# Patient Record
Sex: Male | Born: 1935 | Race: White | Hispanic: No | Marital: Married | Smoking: Former smoker
Health system: Southern US, Community
[De-identification: ages and names within clinical notes are randomized; demographics above are authoritative.]

## PROBLEM LIST (undated history)

## (undated) DIAGNOSIS — M79672 Pain in left foot: Secondary | ICD-10-CM

## (undated) DIAGNOSIS — Z952 Presence of prosthetic heart valve: Secondary | ICD-10-CM

## (undated) DIAGNOSIS — M1990 Unspecified osteoarthritis, unspecified site: Secondary | ICD-10-CM

## (undated) DIAGNOSIS — N183 Chronic kidney disease, stage 3 unspecified: Secondary | ICD-10-CM

## (undated) DIAGNOSIS — I5042 Chronic combined systolic (congestive) and diastolic (congestive) heart failure: Secondary | ICD-10-CM

## (undated) DIAGNOSIS — M79671 Pain in right foot: Secondary | ICD-10-CM

## (undated) DIAGNOSIS — Z87448 Personal history of other diseases of urinary system: Secondary | ICD-10-CM

## (undated) DIAGNOSIS — E785 Hyperlipidemia, unspecified: Secondary | ICD-10-CM

## (undated) DIAGNOSIS — K219 Gastro-esophageal reflux disease without esophagitis: Secondary | ICD-10-CM

## (undated) DIAGNOSIS — I739 Peripheral vascular disease, unspecified: Secondary | ICD-10-CM

## (undated) DIAGNOSIS — Z950 Presence of cardiac pacemaker: Secondary | ICD-10-CM

## (undated) DIAGNOSIS — F419 Anxiety disorder, unspecified: Secondary | ICD-10-CM

## (undated) DIAGNOSIS — I2510 Atherosclerotic heart disease of native coronary artery without angina pectoris: Secondary | ICD-10-CM

## (undated) DIAGNOSIS — I10 Essential (primary) hypertension: Secondary | ICD-10-CM

## (undated) DIAGNOSIS — J449 Chronic obstructive pulmonary disease, unspecified: Secondary | ICD-10-CM

## (undated) DIAGNOSIS — Z87891 Personal history of nicotine dependence: Secondary | ICD-10-CM

## (undated) DIAGNOSIS — R011 Cardiac murmur, unspecified: Secondary | ICD-10-CM

## (undated) DIAGNOSIS — I499 Cardiac arrhythmia, unspecified: Secondary | ICD-10-CM

## (undated) DIAGNOSIS — I779 Disorder of arteries and arterioles, unspecified: Secondary | ICD-10-CM

## (undated) DIAGNOSIS — I428 Other cardiomyopathies: Secondary | ICD-10-CM

## (undated) DIAGNOSIS — F32A Depression, unspecified: Secondary | ICD-10-CM

## (undated) DIAGNOSIS — F329 Major depressive disorder, single episode, unspecified: Secondary | ICD-10-CM

## (undated) DIAGNOSIS — I480 Paroxysmal atrial fibrillation: Secondary | ICD-10-CM

## (undated) DIAGNOSIS — I639 Cerebral infarction, unspecified: Secondary | ICD-10-CM

## (undated) DIAGNOSIS — E559 Vitamin D deficiency, unspecified: Secondary | ICD-10-CM

## (undated) DIAGNOSIS — I340 Nonrheumatic mitral (valve) insufficiency: Secondary | ICD-10-CM

## (undated) DIAGNOSIS — IMO0001 Reserved for inherently not codable concepts without codable children: Secondary | ICD-10-CM

## (undated) HISTORY — PX: CAROTID ENDARTERECTOMY: SUR193

## (undated) HISTORY — DX: Presence of prosthetic heart valve: Z95.2

## (undated) HISTORY — PX: TONSILLECTOMY: SUR1361

## (undated) HISTORY — DX: Chronic kidney disease, stage 3 (moderate): N18.3

## (undated) HISTORY — PX: CORONARY ANGIOPLASTY: SHX604

## (undated) HISTORY — PX: EYE SURGERY: SHX253

## (undated) HISTORY — DX: Nonrheumatic mitral (valve) insufficiency: I34.0

## (undated) HISTORY — PX: OTHER SURGICAL HISTORY: SHX169

## (undated) HISTORY — DX: Other cardiomyopathies: I42.8

## (undated) HISTORY — DX: Chronic kidney disease, stage 3 unspecified: N18.30

## (undated) HISTORY — DX: Personal history of nicotine dependence: Z87.891

## (undated) HISTORY — DX: Disorder of arteries and arterioles, unspecified: I77.9

## (undated) HISTORY — PX: INSERT / REPLACE / REMOVE PACEMAKER: SUR710

## (undated) HISTORY — DX: Peripheral vascular disease, unspecified: I73.9

## (undated) HISTORY — DX: Paroxysmal atrial fibrillation: I48.0

## (undated) HISTORY — DX: Chronic combined systolic (congestive) and diastolic (congestive) heart failure: I50.42

---

## 1994-03-28 HISTORY — PX: CAROTID ANGIOGRAM: SHX5765

## 2008-12-26 HISTORY — PX: AORTIC VALVE REPLACEMENT (AVR)/CORONARY ARTERY BYPASS GRAFTING (CABG): SHX5725

## 2013-03-25 ENCOUNTER — Observation Stay (HOSPITAL_COMMUNITY)
Admission: EM | Admit: 2013-03-25 | Discharge: 2013-03-27 | Disposition: A | Discharge status: Home / Self Care | Payer: Medicare Other | Attending: Internal Medicine | Admitting: Internal Medicine

## 2013-03-25 ENCOUNTER — Emergency Department (HOSPITAL_COMMUNITY): Payer: Medicare Other

## 2013-03-25 ENCOUNTER — Encounter (HOSPITAL_COMMUNITY): Payer: Self-pay | Admitting: Emergency Medicine

## 2013-03-25 DIAGNOSIS — I2510 Atherosclerotic heart disease of native coronary artery without angina pectoris: Secondary | ICD-10-CM | POA: Diagnosis present

## 2013-03-25 DIAGNOSIS — Z951 Presence of aortocoronary bypass graft: Secondary | ICD-10-CM | POA: Insufficient documentation

## 2013-03-25 DIAGNOSIS — Z8673 Personal history of transient ischemic attack (TIA), and cerebral infarction without residual deficits: Secondary | ICD-10-CM | POA: Insufficient documentation

## 2013-03-25 DIAGNOSIS — I639 Cerebral infarction, unspecified: Secondary | ICD-10-CM | POA: Diagnosis present

## 2013-03-25 DIAGNOSIS — N183 Chronic kidney disease, stage 3 unspecified: Secondary | ICD-10-CM | POA: Diagnosis present

## 2013-03-25 DIAGNOSIS — E785 Hyperlipidemia, unspecified: Secondary | ICD-10-CM | POA: Insufficient documentation

## 2013-03-25 DIAGNOSIS — I129 Hypertensive chronic kidney disease with stage 1 through stage 4 chronic kidney disease, or unspecified chronic kidney disease: Secondary | ICD-10-CM | POA: Insufficient documentation

## 2013-03-25 DIAGNOSIS — Z7901 Long term (current) use of anticoagulants: Secondary | ICD-10-CM | POA: Insufficient documentation

## 2013-03-25 DIAGNOSIS — G459 Transient cerebral ischemic attack, unspecified: Principal | ICD-10-CM | POA: Diagnosis present

## 2013-03-25 DIAGNOSIS — Z87891 Personal history of nicotine dependence: Secondary | ICD-10-CM | POA: Insufficient documentation

## 2013-03-25 DIAGNOSIS — I10 Essential (primary) hypertension: Secondary | ICD-10-CM | POA: Diagnosis present

## 2013-03-25 DIAGNOSIS — F29 Unspecified psychosis not due to a substance or known physiological condition: Secondary | ICD-10-CM | POA: Insufficient documentation

## 2013-03-25 DIAGNOSIS — Z954 Presence of other heart-valve replacement: Secondary | ICD-10-CM | POA: Insufficient documentation

## 2013-03-25 DIAGNOSIS — Z7982 Long term (current) use of aspirin: Secondary | ICD-10-CM | POA: Insufficient documentation

## 2013-03-25 HISTORY — DX: Pain in left foot: M79.672

## 2013-03-25 HISTORY — DX: Hyperlipidemia, unspecified: E78.5

## 2013-03-25 HISTORY — DX: Atherosclerotic heart disease of native coronary artery without angina pectoris: I25.10

## 2013-03-25 HISTORY — DX: Personal history of other diseases of urinary system: Z87.448

## 2013-03-25 HISTORY — DX: Gastro-esophageal reflux disease without esophagitis: K21.9

## 2013-03-25 HISTORY — DX: Unspecified osteoarthritis, unspecified site: M19.90

## 2013-03-25 HISTORY — DX: Vitamin D deficiency, unspecified: E55.9

## 2013-03-25 HISTORY — DX: Cerebral infarction, unspecified: I63.9

## 2013-03-25 HISTORY — DX: Pain in right foot: M79.671

## 2013-03-25 HISTORY — DX: Essential (primary) hypertension: I10

## 2013-03-25 LAB — CBC
HCT: 42 % (ref 39.0–52.0)
Hemoglobin: 14.8 g/dL (ref 13.0–17.0)
MCV: 96.1 fL (ref 78.0–100.0)
RDW: 12.8 % (ref 11.5–15.5)
WBC: 14.8 10*3/uL — ABNORMAL HIGH (ref 4.0–10.5)

## 2013-03-25 LAB — PROTIME-INR
INR: 2.57 — ABNORMAL HIGH (ref 0.00–1.49)
Prothrombin Time: 26.7 seconds — ABNORMAL HIGH (ref 11.6–15.2)

## 2013-03-25 LAB — POCT I-STAT, CHEM 8
BUN: 30 mg/dL — ABNORMAL HIGH (ref 6–23)
Calcium, Ion: 1.1 mmol/L — ABNORMAL LOW (ref 1.13–1.30)
Chloride: 106 mEq/L (ref 96–112)
Glucose, Bld: 98 mg/dL (ref 70–99)
HCT: 44 % (ref 39.0–52.0)
Potassium: 3.9 mEq/L (ref 3.5–5.1)
Sodium: 143 mEq/L (ref 135–145)

## 2013-03-25 LAB — BASIC METABOLIC PANEL
BUN: 29 mg/dL — ABNORMAL HIGH (ref 6–23)
CO2: 22 mEq/L (ref 19–32)
Chloride: 107 mEq/L (ref 96–112)
Creatinine, Ser: 1.28 mg/dL (ref 0.50–1.35)
GFR calc Af Amer: 59 mL/min — ABNORMAL LOW (ref >90)
GFR calc non Af Amer: 51 mL/min — ABNORMAL LOW (ref >90)
Glucose, Bld: 101 mg/dL — ABNORMAL HIGH (ref 70–99)
Potassium: 4 mEq/L (ref 3.5–5.1)

## 2013-03-25 LAB — POCT I-STAT TROPONIN I: Troponin i, poc: 0.03 ng/mL (ref 0.00–0.08)

## 2013-03-25 NOTE — Consult Note (Signed)
Referring Physician: Dr. Wilkie Aye    Chief Complaint: Transient speech difficulty and confusion.  HPI: Timothy Durham is an 79 y.o. male with a history of hypertension, previous right cerebral infarctions, coronary artery disease and aortic valve replacement, presenting with a history of transient speech difficulty and confusion. Onset was at 7:25 PM tonight. Symptoms lasted 5-10 minutes then resolved. His been no recurrence. Symptoms were very similar to speech difficulty associated with this first stroke. His been on Coumadin for anticoagulation. INR tonight was 2.57. His NIH stroke score in the emergency room was 0. CT scan of his head showed previous right posterior frontal and occipital infarctions, but no acute intracranial findings.  LSN: 7:25 PM on 03/25/2013 tPA Given: No: Rapidly resolved  deficits; on Coumadin with INR greater than 1.8 MRankin: 0  Past Medical History  Diagnosis Date  . Arthritis   . Coronary artery disease   . Hypertension   . Stroke     July 2000  January 2007  . Renal disorder     non acute    No family history on file.   Medications: I have reviewed the patient's current medications.  ROS: History obtained from spouse and the patient  General ROS: negative for - chills, fatigue, fever, night sweats, weight gain or weight loss Psychological ROS: negative for - behavioral disorder, hallucinations, memory difficulties, mood swings or suicidal ideation Ophthalmic ROS: negative for - blurry vision, double vision, eye pain or loss of vision ENT ROS: negative for - epistaxis, nasal discharge, oral lesions, sore throat, tinnitus or vertigo Allergy and Immunology ROS: negative for - hives or itchy/watery eyes Hematological and Lymphatic ROS: negative for - bleeding problems, bruising or swollen lymph nodes Endocrine ROS: negative for - galactorrhea, hair pattern changes, polydipsia/polyuria or temperature intolerance Respiratory ROS: negative for - cough,  hemoptysis, shortness of breath or wheezing Cardiovascular ROS: negative for - chest pain, dyspnea on exertion, edema or irregular heartbeat Gastrointestinal ROS: negative for - abdominal pain, diarrhea, hematemesis, nausea/vomiting or stool incontinence Genito-Urinary ROS: negative for - dysuria, hematuria, incontinence or urinary frequency/urgency Musculoskeletal ROS: negative for - joint swelling or muscular weakness Neurological ROS: as noted in HPI Dermatological ROS: negative for rash and skin lesion changes  Physical Examination: Blood pressure 130/44, pulse 94, temperature 98.4 F (36.9 C), temperature source Oral, resp. rate 18, height 5\' 11"  (1.803 m), weight 85.276 kg (188 lb), SpO2 96.00%.  Neurologic Examination: Mental Status: Alert, oriented, thought content appropriate.  Speech fluent without evidence of aphasia. Able to follow commands without difficulty. Cranial Nerves: II-Visual fields were normal with finger counting. III/IV/VI-Pupils were equal and reacted. Extraocular movements were full and conjugate.    V/VII-no facial numbness and no facial weakness. VIII-normal. X-normal speech. Motor: 5/5 bilaterally with normal tone and bulk Sensory: Normal throughout. Deep Tendon Reflexes: 2+ and symmetric. Plantars: Flexor bilaterally Cerebellar: Normal finger-to-nose testing. Carotid auscultation: Normal  Ct Head Wo Contrast  03/25/2013   CLINICAL DATA:  Hypotension in short of breath. Near syncope. Mumbling and decreased response. History of stroke.  EXAM: CT HEAD WITHOUT CONTRAST  TECHNIQUE: Contiguous axial images were obtained from the base of the skull through the vertex without intravenous contrast.  COMPARISON:  None.  FINDINGS: Diffuse cerebral atrophy. Ventricular dilatation consistent with central atrophy. Patchy white matter changes consistent with small vessel ischemia. Focal areas of encephalomalacia in the right posterior frontal lobe and in the right  occipital lobe consistent with old infarcts. There is no mass effect or midline shift.  No abnormal extra-axial fluid collections. Gray-white matter junctions are distinct. Basal cisterns are not effaced. No evidence of acute intracranial hemorrhage. Visualized paranasal sinuses are not opacified. Vascular calcifications. Calvarium appears intact.  IMPRESSION: No acute intracranial abnormalities. Old infarcts in the right posterior frontal and right occipital regions. Chronic atrophy and small vessel ischemic changes. The   Electronically Signed   By: Burman Nieves M.D.   On: 03/25/2013 21:50    Assessment: 79 y.o. male with a history of hypertension, aortic valve replacement on anticoagulation, and previous right cerebral infarctions, presenting with probable recurrent cerebral ischemic event, likely transient ischemic attack. Recurrent small subcortical stroke cannot be ruled out.  Stroke Risk Factors - family history, hyperlipidemia and hypertension  Plan: 1. HgbA1c, fasting lipid panel 2. MRI, MRA  of the brain without contrast 3. PT consult, OT consult, Speech consult 4. Echocardiogram 5. Carotid dopplers 6. Prophylactic therapy-Anticoagulation: Coumadin 7. Risk factor modification 8. Telemetry monitoring   C.R. Roseanne Reno, MD [facility]  03/25/2013, 11:01 PM

## 2013-03-25 NOTE — ED Provider Notes (Signed)
CSN: [id number]     Arrival date & time 03/25/13  2025 History   First MD Initiated Contact with Patient 03/25/13 2028     Chief Complaint  Patient presents with  . Near Syncope   Patient is a 79 y.o. male presenting with near-syncope.  Near Syncope This is a new problem. The current episode started today. Episode frequency: once. The problem has been resolved. Pertinent negatives include no chest pain, chills, fever, headaches, nausea, numbness, rash, vomiting or weakness. Nothing aggravates the symptoms. He has tried nothing for the symptoms.   Pt was sitting watching television when he began to feel "bad". The patient's wife states she looked over at him and he appeared to be confusion and looked like he was "struggling to breath". She states she went up to him and he was trying to talk to her when she noticed that his speech was slurred and he was confused. She called 911 and several minutes later the patient returned to his baseline. He states he never fully lost consciousness. He states he can't describe how he felt during the episode but he now feels back to his baseline. He denies any chest pain, nausea, vomiting, diarrhea, or bloody bowel movements.   Past Medical History  Diagnosis Date  . Arthritis   . Coronary artery disease   . Hypertension   . Stroke     July 2000  January 2007  . Renal disorder     non acute   Past Surgical History  Procedure Laterality Date  . Aortic valve replacement (avr)/coronary artery bypass grafting (cabg)  12/2008  . Eye surgery    . Carotid angiogram  1996   No family history on file. History  Substance Use Topics  . Smoking status: Former Smoker    Quit date: 09/26/1998  . Smokeless tobacco: Not on file  . Alcohol Use: Not on file    Review of Systems  Constitutional: Negative for fever and chills.  Eyes: Negative for visual disturbance.  Respiratory: Negative for shortness of breath.   Cardiovascular: Positive for near-syncope.  Negative for chest pain.  Gastrointestinal: Negative for nausea and vomiting.  Genitourinary: Negative for dysuria and frequency.  Skin: Negative for rash.  Neurological: Positive for speech difficulty and light-headedness. Negative for weakness, numbness and headaches.  All other systems reviewed and are negative.   Allergies  Penicillins and Sulfa antibiotics  Home Medications   Current Outpatient Rx  Name  Route  Sig  Dispense  Refill  . acetaminophen (TYLENOL) 500 MG tablet   Oral   Take 1,000 mg by mouth every evening.         Marland Kitchen alendronate (FOSAMAX) 70 MG tablet   Oral   Take 70 mg by mouth once a week. sunday         . amLODipine (NORVASC) 2.5 MG tablet   Oral   Take 2.5 mg by mouth every evening.          Marland Kitchen aspirin EC 81 MG tablet   Oral   Take 81 mg by mouth every morning.         Marland Kitchen atorvastatin (LIPITOR) 40 MG tablet   Oral   Take 40 mg by mouth every evening.         . Calcium Carb-Cholecalciferol (CALCIUM 600 + D PO)   Oral   Take 2 tablets by mouth every morning.         . cholecalciferol (VITAMIN D) 1000 UNITS tablet  Oral   Take 1,000 Units by mouth every morning.         . furosemide (LASIX) 40 MG tablet   Oral   Take 40 mg by mouth daily.         . hydrochlorothiazide (HYDRODIURIL) 25 MG tablet   Oral   Take 25 mg by mouth every morning.         . Omega-3 Fatty Acids (FISH OIL) 1200 MG CAPS   Oral   Take 1 capsule by mouth every morning.         Marland Kitchen omeprazole (PRILOSEC) 40 MG capsule   Oral   Take 40 mg by mouth every morning.         . quinapril (ACCUPRIL) 40 MG tablet   Oral   Take 20-40 mg by mouth 2 (two) times daily. 40 mg a.m. And 20mg  p.m.         Marland Kitchen warfarin (COUMADIN) 4 MG tablet   Oral   Take 2-4 mg by mouth every morning. 4 mg every morning except on mondays pt takes 2mg           BP 131/45  Pulse 79  Temp(Src) 98.4 F (36.9 C) (Oral)  Resp 25  Ht 5\' 11"  (1.803 m)  Wt 188 lb (85.276 kg)  BMI  26.23 kg/m2  SpO2 97% Physical Exam  Constitutional: He is oriented to person, place, and time. He appears well-developed and well-nourished. No distress.  HENT:  Head: Normocephalic and atraumatic.  Mouth/Throat: No oropharyngeal exudate.  Eyes: Conjunctivae are normal. Pupils are equal, round, and reactive to light.  Neck: Normal range of motion. Neck supple.  Cardiovascular: Normal rate and normal heart sounds.  Exam reveals no gallop and no friction rub.   No murmur heard. Pulmonary/Chest: Effort normal and breath sounds normal.  Abdominal: Soft. He exhibits no distension. There is no tenderness.  Musculoskeletal: Normal range of motion. He exhibits no edema and no tenderness.  Neurological: He is alert and oriented to person, place, and time. He has normal strength and normal reflexes. No cranial nerve deficit or sensory deficit. Coordination normal. GCS eye subscore is 4. GCS verbal subscore is 5. GCS motor subscore is 6.  Skin: Skin is warm and dry.   ED Course  Procedures (including critical care time) Labs Review Labs Reviewed  CBC - Abnormal; Notable for the following:    WBC 14.8 (*)    All other components within normal limits  BASIC METABOLIC PANEL - Abnormal; Notable for the following:    Glucose, Bld 101 (*)    BUN 29 (*)    Calcium 7.8 (*)    GFR calc non Af Amer 51 (*)    GFR calc Af Amer 59 (*)    All other components within normal limits  GLUCOSE, CAPILLARY - Abnormal; Notable for the following:    Glucose-Capillary 103 (*)    All other components within normal limits  PROTIME-INR - Abnormal; Notable for the following:    Prothrombin Time 26.7 (*)    INR 2.57 (*)    All other components within normal limits  POCT I-STAT, CHEM 8 - Abnormal; Notable for the following:    BUN 30 (*)    Creatinine, Ser 1.40 (*)    Calcium, Ion 1.10 (*)    All other components within normal limits  POCT I-STAT TROPONIN I   Imaging Review Ct Head Wo Contrast  03/25/2013    CLINICAL DATA:  Hypotension in short of breath. Near  syncope. Mumbling and decreased response. History of stroke.  EXAM: CT HEAD WITHOUT CONTRAST  TECHNIQUE: Contiguous axial images were obtained from the base of the skull through the vertex without intravenous contrast.  COMPARISON:  None.  FINDINGS: Diffuse cerebral atrophy. Ventricular dilatation consistent with central atrophy. Patchy white matter changes consistent with small vessel ischemia. Focal areas of encephalomalacia in the right posterior frontal lobe and in the right occipital lobe consistent with old infarcts. There is no mass effect or midline shift. No abnormal extra-axial fluid collections. Gray-white matter junctions are distinct. Basal cisterns are not effaced. No evidence of acute intracranial hemorrhage. Visualized paranasal sinuses are not opacified. Vascular calcifications. Calvarium appears intact.  IMPRESSION: No acute intracranial abnormalities. Old infarcts in the right posterior frontal and right occipital regions. Chronic atrophy and small vessel ischemic changes. The   Electronically Signed   By: Burman Nieves M.D.   On: 03/25/2013 21:50    EKG Interpretation    Date/Time:  Monday March 25 2013 20:38:42 EST Ventricular Rate:  98 PR Interval:  172 QRS Duration: 135 QT Interval:  382 QTC Calculation: 488 R Axis:   -27 Text Interpretation:  Sinus arrhythmia Left bundle branch block Confirmed by HORTON  MD, COURTNEY ([id number]) on 03/25/2013 9:58:08 PM           MDM   Here after presyncopal episode with transient confusion and dysarthria. Now back at baseline. Afebrile with stable vital signs. Non-focal neurological exam. CT head WNL. Labs unremarkable. Consulted neurology for concern for TIA like symptoms who recommended admission for further evaluation. The patient was admitted to internal medicine in HDS condition. Full dose ASA given prior to admission.   1. TIA (transient ischemic attack)   2. Hypertension         Shanon Ace, MD 03/26/13 [phone]

## 2013-03-25 NOTE — ED Notes (Signed)
MD at bedside. 

## 2013-03-25 NOTE — ED Notes (Signed)
Pt wife noticed pt coughing and struggling to breath. She asked "Honey are you all right." No response but mumbled. Wife thought it was stroke due to prev hx of stroke x2. Hypotensive and SOB. ekg 1st degree AVB/ LBB. Denies chest pain.

## 2013-03-25 NOTE — ED Notes (Signed)
MD at bedside.Neurologist

## 2013-03-26 ENCOUNTER — Encounter (HOSPITAL_COMMUNITY): Payer: Self-pay | Admitting: Internal Medicine

## 2013-03-26 ENCOUNTER — Observation Stay (HOSPITAL_COMMUNITY): Payer: Medicare Other

## 2013-03-26 DIAGNOSIS — G459 Transient cerebral ischemic attack, unspecified: Secondary | ICD-10-CM

## 2013-03-26 DIAGNOSIS — I2510 Atherosclerotic heart disease of native coronary artery without angina pectoris: Secondary | ICD-10-CM | POA: Diagnosis present

## 2013-03-26 DIAGNOSIS — I6350 Cerebral infarction due to unspecified occlusion or stenosis of unspecified cerebral artery: Secondary | ICD-10-CM

## 2013-03-26 DIAGNOSIS — I359 Nonrheumatic aortic valve disorder, unspecified: Secondary | ICD-10-CM

## 2013-03-26 DIAGNOSIS — I639 Cerebral infarction, unspecified: Secondary | ICD-10-CM | POA: Diagnosis present

## 2013-03-26 LAB — RAPID URINE DRUG SCREEN, HOSP PERFORMED
Barbiturates: NOT DETECTED
Cocaine: NOT DETECTED
Opiates: NOT DETECTED
Tetrahydrocannabinol: NOT DETECTED

## 2013-03-26 LAB — LIPID PANEL
Cholesterol: 146 mg/dL (ref 0–200)
HDL: 47 mg/dL (ref >39)
Total CHOL/HDL Ratio: 3.1 RATIO
VLDL: 18 mg/dL (ref 0–40)

## 2013-03-26 LAB — BASIC METABOLIC PANEL
BUN: 31 mg/dL — ABNORMAL HIGH (ref 6–23)
Calcium: 8 mg/dL — ABNORMAL LOW (ref 8.4–10.5)
Chloride: 108 mEq/L (ref 96–112)
Creatinine, Ser: 1.33 mg/dL (ref 0.50–1.35)
GFR calc Af Amer: 57 mL/min — ABNORMAL LOW (ref >90)
GFR calc non Af Amer: 49 mL/min — ABNORMAL LOW (ref >90)
Glucose, Bld: 89 mg/dL (ref 70–99)

## 2013-03-26 LAB — PROTIME-INR
INR: 2.48 — ABNORMAL HIGH (ref 0.00–1.49)
Prothrombin Time: 26 seconds — ABNORMAL HIGH (ref 11.6–15.2)

## 2013-03-26 LAB — CBC
Hemoglobin: 13.9 g/dL (ref 13.0–17.0)
MCH: 33.2 pg (ref 26.0–34.0)
MCHC: 35.4 g/dL (ref 30.0–36.0)
Platelets: 195 10*3/uL (ref 150–400)
RDW: 12.8 % (ref 11.5–15.5)

## 2013-03-26 LAB — GLUCOSE, CAPILLARY: Glucose-Capillary: 93 mg/dL (ref 70–99)

## 2013-03-26 LAB — HEMOGLOBIN A1C: Hgb A1c MFr Bld: 5.7 % — ABNORMAL HIGH (ref <5.7)

## 2013-03-26 MED ORDER — WARFARIN SODIUM 2 MG PO TABS: 2.0000 mg | ORAL_TABLET | Freq: Every morning | ORAL | Status: DC

## 2013-03-26 MED ORDER — ASPIRIN EC 325 MG PO TBEC
325.0000 mg | DELAYED_RELEASE_TABLET | Freq: Every day | ORAL | Status: DC
  Administered 2013-03-26: 325 mg via ORAL
  Filled 2013-03-26 (×2): qty 1

## 2013-03-26 MED ORDER — ATORVASTATIN CALCIUM 40 MG PO TABS
40.0000 mg | ORAL_TABLET | Freq: Every evening | ORAL | Status: DC
  Administered 2013-03-26: 40 mg via ORAL
  Filled 2013-03-26 (×2): qty 1

## 2013-03-26 MED ORDER — HYDROCHLOROTHIAZIDE 25 MG PO TABS
25.0000 mg | ORAL_TABLET | Freq: Every morning | ORAL | Status: DC
  Filled 2013-03-26 (×2): qty 1

## 2013-03-26 MED ORDER — LISINOPRIL 40 MG PO TABS
40.0000 mg | ORAL_TABLET | Freq: Every day | ORAL | Status: DC
  Administered 2013-03-26 – 2013-03-27 (×2): 40 mg via ORAL
  Filled 2013-03-26 (×2): qty 1

## 2013-03-26 MED ORDER — FISH OIL 1200 MG PO CAPS: 1.0000 | ORAL_CAPSULE | Freq: Every morning | ORAL | Status: DC

## 2013-03-26 MED ORDER — PANTOPRAZOLE SODIUM 40 MG PO TBEC
80.0000 mg | DELAYED_RELEASE_TABLET | Freq: Every day | ORAL | Status: DC
  Administered 2013-03-26 – 2013-03-27 (×2): 80 mg via ORAL
  Filled 2013-03-26 (×2): qty 2

## 2013-03-26 MED ORDER — VITAMIN D3 25 MCG (1000 UNIT) PO TABS
1000.0000 [IU] | ORAL_TABLET | Freq: Every morning | ORAL | Status: DC
  Administered 2013-03-26 – 2013-03-27 (×2): 1000 [IU] via ORAL
  Filled 2013-03-26 (×2): qty 1

## 2013-03-26 MED ORDER — AMLODIPINE BESYLATE 2.5 MG PO TABS
2.5000 mg | ORAL_TABLET | Freq: Every evening | ORAL | Status: DC
  Filled 2013-03-26 (×2): qty 1

## 2013-03-26 MED ORDER — WARFARIN SODIUM 2 MG PO TABS: 2.0000 mg | ORAL_TABLET | ORAL | Status: DC

## 2013-03-26 MED ORDER — ASPIRIN EC 325 MG PO TBEC: 325.0000 mg | DELAYED_RELEASE_TABLET | Freq: Once | ORAL | Status: DC

## 2013-03-26 MED ORDER — QUINAPRIL HCL 10 MG PO TABS: 20.0000 mg | ORAL_TABLET | Freq: Two times a day (BID) | ORAL | Status: DC

## 2013-03-26 MED ORDER — OMEGA-3-ACID ETHYL ESTERS 1 G PO CAPS
1.0000 g | ORAL_CAPSULE | Freq: Every day | ORAL | Status: DC
  Administered 2013-03-26 – 2013-03-27 (×2): 1 g via ORAL
  Filled 2013-03-26 (×2): qty 1

## 2013-03-26 MED ORDER — LISINOPRIL 20 MG PO TABS
20.0000 mg | ORAL_TABLET | Freq: Every day | ORAL | Status: DC
  Filled 2013-03-26 (×2): qty 1

## 2013-03-26 MED ORDER — WARFARIN SODIUM 4 MG PO TABS
4.0000 mg | ORAL_TABLET | ORAL | Status: DC
  Filled 2013-03-26: qty 1

## 2013-03-26 MED ORDER — WARFARIN SODIUM 4 MG PO TABS
4.0000 mg | ORAL_TABLET | ORAL | Status: DC
  Administered 2013-03-26: 4 mg via ORAL
  Filled 2013-03-26 (×2): qty 1

## 2013-03-26 MED ORDER — FUROSEMIDE 40 MG PO TABS
40.0000 mg | ORAL_TABLET | Freq: Every day | ORAL | Status: DC
  Administered 2013-03-26 – 2013-03-27 (×2): 40 mg via ORAL
  Filled 2013-03-26 (×2): qty 1

## 2013-03-26 MED ORDER — WARFARIN - PHARMACIST DOSING INPATIENT
Freq: Every day | Status: DC
  Administered 2013-03-26: 18:00:00

## 2013-03-26 NOTE — Progress Notes (Signed)
UR completed 

## 2013-03-26 NOTE — Progress Notes (Signed)
Physical Therapy Discharge Patient Details Name: Timothy Durham MRN: [id number] DOB: 1930-11-24 Today's Date: 03/26/2013 Time:  -     Patient discharged from PT services secondary to per OT pt has no needs for PT.  Sign off  GP     Florencia Zaccaro, Eliseo Gum 03/26/2013, 3:08 PM

## 2013-03-26 NOTE — Progress Notes (Signed)
[facility] PROGRESS NOTE  Timothy Durham WUJ:[id number] DOB: 09/03/1930 DOA: 03/25/2013 PCP: No primary provider on file.  Assessment/Plan: TIA  - Monitor the patient on telemetry.  -MRI/MRA of the brain with multiple remote infarcts, no acute infarct seen -2-D echocardiogram done, pending results -Carotid Dopplers with 80-99% L ICA stenosis -> consider Vasc Surg consult? -Continue aspirin. Continue anticoagulation on Coumadin.  -Appreciate neurology following the patient Hypertension  Stable. Continue home antihypertensive medication.  Coronary artery disease  Stable. Continue home medications.  History of CVA  Management as indicated above.  Chronic kidney disease stage III  No prior creatinine for comparison. Continue to monitor.  Prophylaxis  Therapeutic INR.  CODE STATUS  Full code.  Disposition  Admit the patient to telemetry as observation.  Code Status: Full Family Communication: Pt in room (indicate person spoken with, relationship, and if by phone, the number) Disposition Plan: Pending   Consultants:  neurology  HPI/Subjective: No acute events overnight  Objective: Filed Vitals:   03/25/13 2200 03/25/13 2330 03/26/13 0137 03/26/13 0542  BP: 130/44 131/45 169/52 129/46  Pulse: 94 79 98 71  Temp:   97.5 F (36.4 C) 98.3 F (36.8 C)  TempSrc:   Oral Oral  Resp: 18 25 18 16   Height:   5\' 11"  (1.803 m)   Weight:   82.101 kg (181 lb)   SpO2: 96% 97% 98% 94%   No intake or output data in the 24 hours ending 03/26/13 1306 Filed Weights   03/25/13 2040 03/26/13 0137  Weight: 85.276 kg (188 lb) 82.101 kg (181 lb)    Exam:   General:  Awake, in nad  Cardiovascular: regular, s1, s2  Respiratory: normal resp effort, no wheezing  Abdomen: soft, nondistended  Musculoskeletal: perfused, no clubbing   Data Reviewed: Basic Metabolic Panel:  Recent Labs Lab 03/25/13 2046 03/25/13 2103 03/26/13 0820  NA 141 143 143  K 4.0 3.9 4.8  CL 107  106 108  CO2 22  --  23  GLUCOSE 101* 98 89  BUN 29* 30* 31*  CREATININE 1.28 1.40* 1.33  CALCIUM 7.8*  --  8.0*   Liver Function Tests: No results found for this basename: AST, ALT, ALKPHOS, BILITOT, PROT, ALBUMIN,  in the last 168 hours No results found for this basename: LIPASE, AMYLASE,  in the last 168 hours No results found for this basename: AMMONIA,  in the last 168 hours CBC:  Recent Labs Lab 03/25/13 2046 03/25/13 2103 03/26/13 0820  WBC 14.8*  --  7.4  HGB 14.8 15.0 13.9  HCT 42.0 44.0 39.3  MCV 96.1  --  93.8  PLT 206  --  195   Cardiac Enzymes: No results found for this basename: CKTOTAL, CKMB, CKMBINDEX, TROPONINI,  in the last 168 hours BNP (last 3 results) No results found for this basename: PROBNP,  in the last 8760 hours CBG:  Recent Labs Lab 03/25/13 2058  GLUCAP 103*    No results found for this or any previous visit (from the past 240 hour(s)).   Studies: Ct Head Wo Contrast  03/25/2013   CLINICAL DATA:  Hypotension in short of breath. Near syncope. Mumbling and decreased response. History of stroke.  EXAM: CT HEAD WITHOUT CONTRAST  TECHNIQUE: Contiguous axial images were obtained from the base of the skull through the vertex without intravenous contrast.  COMPARISON:  None.  FINDINGS: Diffuse cerebral atrophy. Ventricular dilatation consistent with central atrophy. Patchy white matter changes consistent with small vessel ischemia. Focal areas  of encephalomalacia in the right posterior frontal lobe and in the right occipital lobe consistent with old infarcts. There is no mass effect or midline shift. No abnormal extra-axial fluid collections. Gray-white matter junctions are distinct. Basal cisterns are not effaced. No evidence of acute intracranial hemorrhage. Visualized paranasal sinuses are not opacified. Vascular calcifications. Calvarium appears intact.  IMPRESSION: No acute intracranial abnormalities. Old infarcts in the right posterior frontal and  right occipital regions. Chronic atrophy and small vessel ischemic changes. The   Electronically Signed   By: Burman Nieves M.D.   On: 03/25/2013 21:50   Mri Brain Without Contrast  03/26/2013   CLINICAL DATA:  79 year old male with hypertension and coronary artery disease presenting with speech difficulty and confusion. Symptoms lasted for a few minutes.  EXAM: MRI HEAD WITHOUT CONTRAST  MRA HEAD WITHOUT CONTRAST  TECHNIQUE: Multiplanar, multiecho pulse sequences of the brain and surrounding structures were obtained without intravenous contrast. Angiographic images of the head were obtained using MRA technique without contrast.  COMPARISON:  03/25/2013 CT.  No comparison MR.  FINDINGS: MRI HEAD FINDINGS  No acute infarct.  Remote right posterior frontal infarct with encephalomalacia most prominent peri operculum region. On diffusion sequence, T2 shine through posterior aspect of the remote infarct.  Remote very small left frontal lobe infarct.  Remote small to slightly moderate-size right occipital lobe infarct.  Mild small vessel disease type changes.  Scattered blood breakdown products most prominent left hemisphere may be related to prior episodes hemorrhagic ischemia. Result of prior trauma or cavernomas felt to be secondary considerations.  Global atrophy without hydrocephalus.  No intracranial mass lesion noted on this unenhanced exam.  Partial opacification mastoid air cells greater on left. No obstructing lesion at the drainage of the eustachian tubes noted. Mild paranasal sinus mucosal thickening.  Cervical medullary junction, pituitary region, pineal region and orbital structures unremarkable.  MRA HEAD FINDINGS  Mild narrowing of the supraclinoid segment of the internal carotid artery bilaterally. Anterior circulation otherwise without medium or large size vessel significant stenosis or occlusion.  Mild irregularity and narrowing of portions of the middle cerebral artery branches bilaterally.  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  Only a small portion of the distal vertebral arteries was imaged on the current examination. No significant narrowing of the visualized portions of the distal vertebral arteries. The full extent of the posterior inferior cerebellar arteries was not imaged on the current exam.  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 No significant stenosis of the basilar artery.  Non visualization left anterior inferior cerebellar artery. Narrowing of the right anterior inferior cerebellar artery.  Narrowing of the superior cerebellar arteries more notable on the right.  Mild irregularity narrowing distal posterior cerebral artery branches.  IMPRESSION: No acute infarct.  Remote infarcts as noted above.  Intracranial atherosclerotic type changes as detailed above.  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  Please see above for further detail.   Electronically Signed   By: Bridgett Larsson M.D.   On: 03/26/2013 12:04   Mr Maxine Glenn Head/brain Wo Cm  03/26/2013   CLINICAL DATA:  79 year old male with hypertension and coronary artery disease presenting with speech difficulty and confusion. Symptoms lasted for a few minutes.  EXAM: MRI HEAD WITHOUT CONTRAST  MRA HEAD WITHOUT CONTRAST  TECHNIQUE: Multiplanar, multiecho pulse sequences of the brain and surrounding structures were obtained without intravenous contrast. Angiographic images of the head were obtained using MRA technique without contrast.  COMPARISON:  03/25/2013 CT.  No comparison MR.  FINDINGS: MRI HEAD FINDINGS  No acute infarct.  Remote right posterior frontal infarct with encephalomalacia most prominent peri operculum region. On diffusion sequence, T2 shine through posterior aspect of the remote infarct.  Remote very small left frontal lobe infarct.  Remote small to slightly moderate-size right occipital lobe infarct.  Mild small vessel disease type changes.   Scattered blood breakdown products most prominent left hemisphere may be related to prior episodes hemorrhagic ischemia. Result of prior trauma or cavernomas felt to be secondary considerations.  Global atrophy without hydrocephalus.  No intracranial mass lesion noted on this unenhanced exam.  Partial opacification mastoid air cells greater on left. No obstructing lesion at the drainage of the eustachian tubes noted. Mild paranasal sinus mucosal thickening.  Cervical medullary junction, pituitary region, pineal region and orbital structures unremarkable.  MRA HEAD FINDINGS  Mild narrowing of the supraclinoid segment of the internal carotid artery bilaterally. Anterior circulation otherwise without medium or large size vessel significant stenosis or occlusion.  Mild irregularity and narrowing of portions of the middle cerebral artery branches bilaterally.  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  Only a small portion of the distal vertebral arteries was imaged on the current examination. No significant narrowing of the visualized portions of the distal vertebral arteries. The full extent of the posterior inferior cerebellar arteries was not imaged on the current exam.  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 No significant stenosis of the basilar artery.  Non visualization left anterior inferior cerebellar artery. Narrowing of the right anterior inferior cerebellar artery.  Narrowing of the superior cerebellar arteries more notable on the right.  Mild irregularity narrowing distal posterior cerebral artery branches.  IMPRESSION: No acute infarct.  Remote infarcts as noted above.  Intracranial atherosclerotic type changes as detailed above.  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  Please see above for further detail.   Electronically Signed   By: Bridgett Larsson M.D.   On: 03/26/2013 12:04    Scheduled Meds: . amLODipine  2.5 mg Oral  QPM  . aspirin EC  325 mg Oral Daily  . atorvastatin  40 mg Oral QPM  . cholecalciferol  1,000 Units Oral q morning - 10a  . furosemide  40 mg Oral Daily  . hydrochlorothiazide  25 mg Oral q morning - 10a  . lisinopril  40 mg Oral Daily   And  . lisinopril  20 mg Oral QHS  . omega-3 acid ethyl esters  1 g Oral Daily  . pantoprazole  80 mg Oral Daily  . [START ON 04/01/2013] warfarin  2 mg Oral Q Mon-1800  . warfarin  4 mg Oral Custom  . Warfarin - Pharmacist Dosing Inpatient   Does not apply q1800   Continuous Infusions:   Principal Problem:   TIA (transient ischemic attack) Active Problems:   Hypertension   Coronary artery disease   Stroke   CKD (chronic kidney disease), stage III  Time spent:  Ordean Fouts K  [facility] Pager [phone]. If 7PM-7AM, please contact night-coverage at [email or web address], password [facility] 03/26/2013, 1:06 PM  LOS: 1 day

## 2013-03-26 NOTE — H&P (Signed)
Patient's PCP: Dr. Luiz Iron, [facility]  Chief Complaint: Speech difficulty and confusion, resolved  History of Present Illness: Timothy Durham is a 79 y.o. Caucasian male hypertension, coronary disease, CVA in July of 2000 and January of 2007 on chronic anticoagulation, chronic kidney disease stage III, and arthritis who presents with the above complaints.  Patient symptoms started yesterday around 725 p.m. with speech difficulties and confusion which were noted by the wife.  She called 911.  Symptoms last only for a few minutes (patient reports 2-3 minutes).  He presented to the emergency department for further evaluation.  He was seen by neurology, recommended hospitalist service admit the patient for further care and management.  Patient denies any recent fevers, chills, nausea, vomiting, chest pain, shortness of breath, abdominal pain, diarrhea, headaches or vision changes.  Review of Systems: All systems reviewed with the patient and positive as per history of present illness, otherwise all other systems are negative.  Past Medical History  Diagnosis Date  . Arthritis   . Coronary artery disease   . Hypertension   . Stroke     July 2000  January 2007  . Renal disorder     non acute   Past Surgical History  Procedure Laterality Date  . Aortic valve replacement (avr)/coronary artery bypass grafting (cabg)  12/2008  . Eye surgery    . Carotid angiogram  1996   Family History  Problem Relation Age of Onset  . Stroke Mother   . Cancer Father    History   Social History  . Marital Status: Married    Spouse Name: N/A    Number of Children: N/A  . Years of Education: N/A   Occupational History  . Not on file.   Social History Main Topics  . Smoking status: Former Smoker    Quit date: 09/26/1998  . Smokeless tobacco: Not on file  . Alcohol Use: 0.6 oz/week    1 Cans of beer per week     Comment: drinks one can of beer daily.  . Drug Use: No  . Sexual Activity: Not on file    Other Topics Concern  . Not on file   Social History Narrative  . No narrative on file   Allergies: Penicillins and Sulfa antibiotics  Home Meds: Prior to Admission medications   Medication Sig Start Date End Date Taking? Authorizing Provider  acetaminophen (TYLENOL) 500 MG tablet Take 1,000 mg by mouth every evening.   Yes Historical Provider, MD  alendronate (FOSAMAX) 70 MG tablet Take 70 mg by mouth once a week. sunday 02/28/13  Yes Historical Provider, MD  amLODipine (NORVASC) 2.5 MG tablet Take 2.5 mg by mouth every evening.  03/12/13  Yes Historical Provider, MD  aspirin EC 81 MG tablet Take 81 mg by mouth every morning.   Yes Historical Provider, MD  atorvastatin (LIPITOR) 40 MG tablet Take 40 mg by mouth every evening.   Yes Historical Provider, MD  Calcium Carb-Cholecalciferol (CALCIUM 600 + D PO) Take 2 tablets by mouth every morning.   Yes Historical Provider, MD  cholecalciferol (VITAMIN D) 1000 UNITS tablet Take 1,000 Units by mouth every morning.   Yes Historical Provider, MD  furosemide (LASIX) 40 MG tablet Take 40 mg by mouth daily. 02/28/13  Yes Historical Provider, MD  hydrochlorothiazide (HYDRODIURIL) 25 MG tablet Take 25 mg by mouth every morning.   Yes Historical Provider, MD  Omega-3 Fatty Acids (FISH OIL) 1200 MG CAPS Take 1 capsule by mouth every morning.  Yes Historical Provider, MD  omeprazole (PRILOSEC) 40 MG capsule Take 40 mg by mouth every morning.   Yes Historical Provider, MD  quinapril (ACCUPRIL) 40 MG tablet Take 20-40 mg by mouth 2 (two) times daily. 40 mg a.m. And 20mg  p.m.   Yes Historical Provider, MD  warfarin (COUMADIN) 4 MG tablet Take 2-4 mg by mouth every morning. 4 mg every morning except on mondays pt takes 2mg  02/24/13  Yes Historical Provider, MD    Physical Exam: Blood pressure 169/52, pulse 98, temperature 97.5 F (36.4 C), temperature source Oral, resp. rate 18, height 5\' 11"  (1.803 m), weight 82.101 kg (181 lb), SpO2 98.00%. General:  Awake, Oriented x3, No acute distress. HEENT: EOMI, Moist mucous membranes Neck: Supple CV: S1 and S2 Lungs: Clear to ascultation bilaterally Abdomen: Soft, Nontender, Nondistended, +bowel sounds. Ext: Good pulses. Trace edema. No clubbing or cyanosis noted. Neuro: Cranial Nerves II-XII grossly intact. Has 5/5 motor strength in upper and lower extremities.  Lab results:  Recent Labs  03/25/13 2046 03/25/13 2103  NA 141 143  K 4.0 3.9  CL 107 106  CO2 22  --   GLUCOSE 101* 98  BUN 29* 30*  CREATININE 1.28 1.40*  CALCIUM 7.8*  --    No results found for this basename: AST, ALT, ALKPHOS, BILITOT, PROT, ALBUMIN,  in the last 72 hours No results found for this basename: LIPASE, AMYLASE,  in the last 72 hours  Recent Labs  03/25/13 2046 03/25/13 2103  WBC 14.8*  --   HGB 14.8 15.0  HCT 42.0 44.0  MCV 96.1  --   PLT 206  --    No results found for this basename: CKTOTAL, CKMB, CKMBINDEX, TROPONINI,  in the last 72 hours No components found with this basename: POCBNP,  No results found for this basename: DDIMER,  in the last 72 hours No results found for this basename: HGBA1C,  in the last 72 hours No results found for this basename: CHOL, HDL, LDLCALC, TRIG, CHOLHDL, LDLDIRECT,  in the last 72 hours No results found for this basename: TSH, T4TOTAL, FREET3, T3FREE, THYROIDAB,  in the last 72 hours No results found for this basename: VITAMINB12, FOLATE, FERRITIN, TIBC, IRON, RETICCTPCT,  in the last 72 hours Imaging results:  Ct Head Wo Contrast  03/25/2013   CLINICAL DATA:  Hypotension in short of breath. Near syncope. Mumbling and decreased response. History of stroke.  EXAM: CT HEAD WITHOUT CONTRAST  TECHNIQUE: Contiguous axial images were obtained from the base of the skull through the vertex without intravenous contrast.  COMPARISON:  None.  FINDINGS: Diffuse cerebral atrophy. Ventricular dilatation consistent with central atrophy. Patchy white matter changes consistent  with small vessel ischemia. Focal areas of encephalomalacia in the right posterior frontal lobe and in the right occipital lobe consistent with old infarcts. There is no mass effect or midline shift. No abnormal extra-axial fluid collections. Gray-white matter junctions are distinct. Basal cisterns are not effaced. No evidence of acute intracranial hemorrhage. Visualized paranasal sinuses are not opacified. Vascular calcifications. Calvarium appears intact.  IMPRESSION: No acute intracranial abnormalities. Old infarcts in the right posterior frontal and right occipital regions. Chronic atrophy and small vessel ischemic changes. The   Electronically Signed   By: Burman Nieves M.D.   On: 03/25/2013 21:50   Assessment & Plan by Problem: TIA Monitor the patient on telemetry.  Initiate TIA workup with serial neuro checks.  Will get an MRI/MRA of the brain in the morning.  Get  a 2-D echocardiogram and carotid Dopplers.  Continue aspirin.  Continue anticoagulation on Coumadin.  Appreciate neurology following the patient.  Request PT/OT/speech therapy consultation.  Check lipid panel and hemoglobin A1c to risk stratify the patient.  Hypertension Stable.  Continue home antihypertensive medication.  Coronary artery disease Stable.  Continue home medications.  History of CVA Management as indicated above.  Chronic kidney disease stage III No prior creatinine for comparison.  Continue to monitor.  Prophylaxis Therapeutic INR.  CODE STATUS Full code.  Disposition Admit the patient to telemetry as observation.  Time spent on admission, talking to the patient, and coordinating care was: 45 mins.  Pegge Cumberledge A, MD 03/26/2013, 2:58 AM

## 2013-03-26 NOTE — Progress Notes (Signed)
  Echocardiogram 2D Echocardiogram has been performed.  Timothy Durham 03/26/2013, 9:35 AM

## 2013-03-26 NOTE — Progress Notes (Signed)
ANTICOAGULATION CONSULT NOTE - Initial Consult  Pharmacy Consult for Warfarin  Indication: AVR (per MD notes), h/o stroke  Allergies  Allergen Reactions  . Penicillins     Unknown childhood reaction   . Sulfa Antibiotics     unknown   Patient Measurements: Height: 5\' 11"  (180.3 cm) Weight: 181 lb (82.101 kg) IBW/kg (Calculated) : 75.3  Vital Signs: Temp: 97.5 F (36.4 C) (12/30 0137) Temp src: Oral (12/30 0137) BP: 169/52 mmHg (12/30 0137) Pulse Rate: 98 (12/30 0137)  Labs:  Recent Labs  03/25/13 2046 03/25/13 2103 03/25/13 2129  HGB 14.8 15.0  --   HCT 42.0 44.0  --   PLT 206  --   --   LABPROT  --   --  26.7*  INR  --   --  2.57*  CREATININE 1.28 1.40*  --     Estimated Creatinine Clearance: 44.8 ml/min (by C-G formula based on Cr of 1.4).   Medical History: Past Medical History  Diagnosis Date  . Arthritis   . Coronary artery disease   . Hypertension   . Stroke     July 2000  January 2007  . Renal disorder     non acute    Medications:  Warfarin 4mg  daily except 2mg  on Mondays  Assessment: 79 y/o M here with TIA/confusion. On chronic warfarin for AVR (per neurology note), also with h/o infarctions. INR on admit is 2.57, other labs as above.   Goal of Therapy:  INR 2-3 Monitor platelets by anticoagulation protocol: Yes   Plan:  -Warfarin per home regimen  -Daily PT/INR, adjust dose as needed -Monitor for bleeding  Thank you for allowing me to take part in this patient's care,  Abran Duke, PharmD Clinical Pharmacist Phone: [phone] Pager: [phone] 03/26/2013 3:04 AM

## 2013-03-26 NOTE — ED Provider Notes (Signed)
I saw and evaluated the patient, reviewed the resident's note and I agree with the findings and plan.  EKG Interpretation    Date/Time:  Monday March 25 2013 20:38:42 EST Ventricular Rate:  98 PR Interval:  172 QRS Duration: 135 QT Interval:  382 QTC Calculation: 488 R Axis:   -27 Text Interpretation:  Sinus arrhythmia Left bundle branch block Confirmed by Kadyn Chovan  MD, Toni Amend ([id number]) on 03/25/2013 9:58:08 PM            Patient presents with ams/near syncopal episode.  Patient had episode of decreased LOC and slurred speech.  Patient was aware during episode and low suspicion for seizure.  Hx of CVA x 2.  On coumadin.  Now back to baseline.  Neurologically intact.  Labs obtained and CT neg.  Will admit for TIA work-up.  Shon Baton, MD 03/26/13 [phone]

## 2013-03-26 NOTE — Evaluation (Signed)
Occupational Therapy Evaluation Patient Details Name: Timothy Durham MRN: [id number] DOB: 07/29/1930 Today's Date: 03/26/2013 Time: 5638-7564 OT Time Calculation (min): 24 min  OT Assessment / Plan / Recommendation History of present illness Timothy Durham is a 79 y.o. Caucasian male hypertension, coronary disease, CVA in July of 2000 and January of 2007 on chronic anticoagulation, chronic kidney disease stage III, and arthritis who presents with the above complaints.  Patient symptoms started yesterday around 725 p.m. with speech difficulties and confusion which were noted by the wife.  She called 911.  Symptoms last only for a few minutes (patient reports 2-3 minutes).     Clinical Impression   Pt is currently at his functional baseline which is independent for selfcare tasks, mobility, and functional transfers.  Pt with no noted balance or cognitive deficits during testing.  No further OT needs at this time.      OT Assessment  Patient does not need any further OT services    Follow Up Recommendations  No OT follow up       Equipment Recommendations  None recommended by OT          Precautions / Restrictions Precautions Precautions: None   Pertinent Vitals/Pain No report of pain    ADL  Eating/Feeding: Simulated;Independent Where Assessed - Eating/Feeding: Chair Grooming: Simulated;Independent Where Assessed - Grooming: Unsupported standing Upper Body Bathing: Simulated;Independent Where Assessed - Upper Body Bathing: Unsupported standing Lower Body Bathing: Simulated;Independent Where Assessed - Lower Body Bathing: Unsupported sit to stand Upper Body Dressing: Simulated;Independent Where Assessed - Upper Body Dressing: Unsupported sitting Lower Body Dressing: Simulated;Modified independent Where Assessed - Lower Body Dressing: Unsupported sit to stand Toilet Transfer: Performed;Independent Toilet Transfer Method: Other (comment) (ambulate without use of assistive  device) Toilet Transfer Equipment: Regular height toilet Toileting - Clothing Manipulation and Hygiene: Simulated;Independent Where Assessed - Toileting Clothing Manipulation and Hygiene: Sit to stand from 3-in-1 or toilet Tub/Shower Transfer: Simulated;Independent Tub/Shower Transfer Method: Ambulating Equipment Used: Gait belt Transfers/Ambulation Related to ADLs: Pt is independent with mobility and selfcare tasks at this time.      Visit Information  Last OT Received On: 03/26/13 Assistance Needed: +1 History of Present Illness: Timothy Durham is a 79 y.o. Caucasian male hypertension, coronary disease, CVA in July of 2000 and January of 2007 on chronic anticoagulation, chronic kidney disease stage III, and arthritis who presents with the above complaints.  Patient symptoms started yesterday around 725 p.m. with speech difficulties and confusion which were noted by the wife.  She called 911.  Symptoms last only for a few minutes (patient reports 2-3 minutes).         Prior Functioning     Home Living Family/patient expects to be discharged to:: Private residence Living Arrangements: Spouse/significant other Available Help at Discharge: Available 24 hours/day Type of Home: Apartment Home Access: Level entry Home Layout: One level Home Equipment: None Prior Function Level of Independence: Independent Communication Communication: No difficulties Dominant Hand: Right         Vision/Perception Vision - History Baseline Vision: No visual deficits Patient Visual Report: No change from baseline Vision - Assessment Vision Assessment: Vision tested Tracking/Visual Pursuits: Able to track stimulus in all quads without difficulty Visual Fields: No apparent deficits Perception Perception: Within Functional Limits Praxis Praxis: Intact   Cognition  Cognition Arousal/Alertness: Awake/alert Behavior During Therapy: WFL for tasks assessed/performed Overall Cognitive Status:  Within Functional Limits for tasks assessed    Extremity/Trunk Assessment Upper Extremity Assessment Upper Extremity Assessment: Overall [facility]  for tasks assessed Lower Extremity Assessment Lower Extremity Assessment: Overall WFL for tasks assessed Cervical / Trunk Assessment Cervical / Trunk Assessment: Normal     Mobility Transfers Transfers: Sit to Stand;Stand to Sit Sit to Stand: 7: Independent;Without upper extremity assist;From toilet Stand to Sit: 7: Independent;Without upper extremity assist;To toilet        Balance Balance Balance Assessed: Yes Dynamic Standing Balance Dynamic Standing - Balance Support: No upper extremity supported Dynamic Standing - Level of Assistance: 7: Independent   End of Session OT - End of Session Equipment Utilized During Treatment: Gait belt Activity Tolerance: Patient tolerated treatment well Patient left: in chair;with call bell/phone within reach;with family/visitor present Nurse Communication: Mobility status  GO Functional Assessment Tool Used: clinical judgement Functional Limitation: Self care Self Care Current Status (J1914): 0 percent impaired, limited or restricted Self Care Goal Status (N8295): 0 percent impaired, limited or restricted Self Care Discharge Status (A2130): 0 percent impaired, limited or restricted   Osualdo Hansell OTR/L 03/26/2013, 2:53 PM

## 2013-03-26 NOTE — Progress Notes (Signed)
Stroke Team Progress Note  HISTORY Timothy Durham is an 79 y.o. male with a history of hypertension, previous right cerebral infarctions, coronary artery disease and aortic valve replacement, presenting with a history of transient speech difficulty and confusion. Onset was at 7:25 PM tonight 03/25/2013. Symptoms lasted 5-10 minutes then resolved. His been no recurrence. Symptoms were very similar to speech difficulty associated with this first stroke. His been on Coumadin for anticoagulation. INR tonight was 2.57. His NIH stroke score in the emergency room was 0. CT scan of his head showed previous right posterior frontal and occipital infarctions, but no acute intracranial findings. Patient was not a TPA candidate secondary to Rapidly resolved deficits; on Coumadin with INR greater than 1.8. He was admitted for further evaluation and treatment.  SUBJECTIVE No family is at the bedside.  Overall he feels his condition is completely resolved. He is sitting up in the chair at the bedside. His plans are to discharge home today.  OBJECTIVE Most recent Vital Signs: Filed Vitals:   03/26/13 1447 03/26/13 2031 03/26/13 2231 03/27/13 0532  BP:  137/41 128/40 134/55  Pulse: 78 69 80 79  Temp:  98.1 F (36.7 C)  97.3 F (36.3 C)  TempSrc:  Oral  Oral  Resp:  20  19  Height:      Weight:      SpO2: 96% 98%  95%   CBG (last 3)   Recent Labs  03/25/13 2058 03/26/13 2227 03/27/13 0619  GLUCAP 103* 93 87    IV Fluid Intake:     MEDICATIONS  . aspirin EC  325 mg Oral Daily  . atorvastatin  40 mg Oral QPM  . cholecalciferol  1,000 Units Oral q morning - 10a  . furosemide  40 mg Oral Daily  . lisinopril  40 mg Oral Daily   And  . lisinopril  20 mg Oral QHS  . omega-3 acid ethyl esters  1 g Oral Daily  . pantoprazole  80 mg Oral Daily  . [START ON 04/01/2013] warfarin  2 mg Oral Q Mon-1800  . warfarin  4 mg Oral Custom  . Warfarin - Pharmacist Dosing Inpatient   Does not apply q1800   PRN:     Diet:  Cardiac thin liquids Activity:   Bathroom privileges with assistance DVT Prophylaxis:  warfarin  CLINICALLY SIGNIFICANT STUDIES Basic Metabolic Panel:   Recent Labs Lab 03/25/13 2046 03/25/13 2103 03/26/13 0820  NA 141 143 143  K 4.0 3.9 4.8  CL 107 106 108  CO2 22  --  23  GLUCOSE 101* 98 89  BUN 29* 30* 31*  CREATININE 1.28 1.40* 1.33  CALCIUM 7.8*  --  8.0*   Liver Function Tests: No results found for this basename: AST, ALT, ALKPHOS, BILITOT, PROT, ALBUMIN,  in the last 168 hours CBC:   Recent Labs Lab 03/25/13 2046 03/25/13 2103 03/26/13 0820  WBC 14.8*  --  7.4  HGB 14.8 15.0 13.9  HCT 42.0 44.0 39.3  MCV 96.1  --  93.8  PLT 206  --  195   Coagulation:   Recent Labs Lab 03/25/13 2129 03/26/13 0820 03/27/13 0520  LABPROT 26.7* 26.0* 21.1*  INR 2.57* 2.48* 1.89*   Cardiac Enzymes: No results found for this basename: CKTOTAL, CKMB, CKMBINDEX, TROPONINI,  in the last 168 hours Urinalysis: No results found for this basename: COLORURINE, APPERANCEUR, LABSPEC, PHURINE, GLUCOSEU, HGBUR, BILIRUBINUR, KETONESUR, PROTEINUR, UROBILINOGEN, NITRITE, LEUKOCYTESUR,  in the last 168 hours Lipid Panel  Component Value Date/Time   CHOL 146 03/26/2013 0820   TRIG 91 03/26/2013 0820   HDL 47 03/26/2013 0820   CHOLHDL 3.1 03/26/2013 0820   VLDL 18 03/26/2013 0820   LDLCALC 81 03/26/2013 0820   HgbA1C  Lab Results  Component Value Date   HGBA1C 5.7* 03/26/2013    Urine Drug Screen:      Component Value Date/Time   LABOPIA NONE DETECTED 03/26/2013 1408    Alcohol Level: No results found for this basename: ETH,  in the last 168 hours  CT of the brain  03/25/2013    No acute intracranial abnormalities. Old infarcts in the right posterior frontal and right occipital regions. Chronic atrophy and small vessel ischemic changes.  MRI of the brain  03/26/2013    No acute infarct.  Remote infarcts.  Intracranial atherosclerotic type changes  MRA of the  brain  03/26/2013   1.8 mm right middle cerebral artery bifurcation aneurysm suspected.  2D Echocardiogram  EF 40-45%. There was severe concentric hypertrophy. Moderate global hypokinesis with regional variation. Bioprosthetic aortic valve, valves not well seen.  Carotid Doppler   1-39% right internal carotid artery stenosis. The left internal carotid artery demonstrates elevated peak systolic velocities suggestive of 80-99% stenosis, elevated end diastolic velocities suggestive of 40-59% stenosis, and ICA/CCA ratio suggestive of upper range 40-59% stenosis. The left ECA demonstrates elevated velocities suggestive of stenosis. Vertebral arteries are patent with antegrade flow.  CXR    EEG   EKG  LBBB, unknown rhythm, irregular.   Therapy Recommendations no needs  Physical Exam   Mental Status:  Alert, oriented, thought content appropriate. Speech fluent without evidence of aphasia. Able to follow commands without difficulty.  Cranial Nerves:  II-Visual fields were normal with finger counting.  III/IV/VI-Pupils were equal and reacted. Extraocular movements were full and conjugate.  V/VII-no facial numbness and no facial weakness.  VIII-normal.  X-normal speech.  Motor: 5/5 bilaterally with normal tone and bulk  Sensory: Normal throughout.  Deep Tendon Reflexes: 2+ and symmetric.  Plantars: Flexor bilaterally  Cerebellar: Normal finger-to-nose testing.  Carotid auscultation: Normal  ASSESSMENT [name] is a 79 y.o. male presenting with transient speech difficulty and confusion. Imaging confirms no acute stroke. Dx:  TIA.  On aspirin 81 mg orally every day and warfarin prior to admission. Now on aspirin 325 mg orally every day and warfarin for secondary stroke prevention. Patient with no resultant neuro deficits. Work up underway.   Left ICA stenosis 40-59% Hyperlipidemia, LDL 81, on lipitor 40 PTA, now on lipitor 40 mg daily, at goal LDL < 100 Hx left brain stroke 2000 w/ R  HP and aphasia, has improved over time. Placed on warfarin at that time per discussion with wife. She is not aware of hx of atrial fibrillation.  Right middle cerebral artery bifurcation aneurysm  Hospital day # 2  TREATMENT/PLAN  Continue aspirin 81 mg orally every day and warfarin for secondary stroke prevention. As prior to admission. Will adjust aspirin down to 81mg  daily.  F/u EEG as an OP  Ok to discharge home today from stroke standpoint  Follow up in [facility] Clinic at [facility] in 2 months. This was added to AVS.  Annie Main, MSN, RN, ANVP-BC, ANP-BC, GNP-BC [facility] Pager: [phone] 03/27/2013 9:49 AM  I have personally obtained a history, examined the patient, evaluated imaging results, and formulated the assessment and plan of care. I agree with the above.   Elspeth Cho, DO Neurology-Stroke

## 2013-03-26 NOTE — Progress Notes (Signed)
*  PRELIMINARY RESULTS* Vascular Ultrasound Carotid Duplex (Doppler) has been completed.   Findings suggest 1-39% right internal carotid artery stenosis, and 40-59% left internal carotid artery stenosis. The left external carotid artery demonstrates elevated velocities suggestive of stenosis. Vertebral arteries are patent with antegrade flow.  03/26/2013 11:01 AM Gertie Fey, RVT, RDCS, RDMS

## 2013-03-27 ENCOUNTER — Observation Stay (HOSPITAL_COMMUNITY): Payer: Medicare Other

## 2013-03-27 DIAGNOSIS — I2510 Atherosclerotic heart disease of native coronary artery without angina pectoris: Secondary | ICD-10-CM

## 2013-03-27 DIAGNOSIS — I10 Essential (primary) hypertension: Secondary | ICD-10-CM

## 2013-03-27 DIAGNOSIS — I6350 Cerebral infarction due to unspecified occlusion or stenosis of unspecified cerebral artery: Secondary | ICD-10-CM

## 2013-03-27 DIAGNOSIS — N183 Chronic kidney disease, stage 3 (moderate): Secondary | ICD-10-CM

## 2013-03-27 DIAGNOSIS — G459 Transient cerebral ischemic attack, unspecified: Secondary | ICD-10-CM

## 2013-03-27 LAB — GLUCOSE, CAPILLARY: Glucose-Capillary: 87 mg/dL (ref 70–99)

## 2013-03-27 LAB — PROTIME-INR
INR: 1.89 — ABNORMAL HIGH (ref 0.00–1.49)
Prothrombin Time: 21.1 seconds — ABNORMAL HIGH (ref 11.6–15.2)

## 2013-03-27 MED ORDER — ASPIRIN EC 81 MG PO TBEC
81.0000 mg | DELAYED_RELEASE_TABLET | Freq: Every day | ORAL | Status: DC
  Administered 2013-03-27: 81 mg via ORAL
  Filled 2013-03-27: qty 1

## 2013-03-27 MED ORDER — ENOXAPARIN SODIUM 40 MG/0.4ML ~~LOC~~ SOLN: 80.0000 mg | Freq: Two times a day (BID) | SUBCUTANEOUS | Status: DC

## 2013-03-27 MED ORDER — ENOXAPARIN SODIUM 80 MG/0.8ML ~~LOC~~ SOLN
80.0000 mg | Freq: Two times a day (BID) | SUBCUTANEOUS | Status: DC
  Administered 2013-03-27: 80 mg via SUBCUTANEOUS
  Filled 2013-03-27 (×2): qty 0.8

## 2013-03-27 MED ORDER — ENOXAPARIN (LOVENOX) PATIENT EDUCATION KIT: PACK | Freq: Once | Status: DC

## 2013-03-27 NOTE — Progress Notes (Signed)
EEG Completed; Results Pending  

## 2013-03-27 NOTE — Procedures (Signed)
EEG report.  Brief clinical history:79 y.o. male with a history of hypertension, previous right cerebral infarctions, coronary artery disease and aortic valve replacement, presenting with a history of transient speech difficulty and confusion.  Technique: this is a 17 channel routine scalp EEG performed at the bedside with bipolar and monopolar montages arranged in accordance to the international 10/20 system of electrode placement. One channel was dedicated to EKG recording.  The study was performed during wakefulness and drowsiness. No activating procedures performed.  Description:In the wakeful state, the best background consisted of a medium amplitude, posterior dominant, well sustained, symmetric and reactive 10 Hz rhythm. Drowsiness demonstrated dropout of the alpha rhythm. No focal or generalized epileptiform discharges noted.  No pathologic areas of slowing seen.  EKG showed sinus rhythm.  Impression: this is a normal awake and drowsy EEG. Please, be aware that a normal EEG does not exclude the possibility of epilepsy.  Clinical correlation is advised.  Wyatt Portela, MD

## 2013-03-27 NOTE — Progress Notes (Signed)
Patient's wife administered lovenox injection w/out difficulty. Patient tolerated well. Preparing for discharge.Mamie Levers

## 2013-03-27 NOTE — Progress Notes (Signed)
ANTICOAGULATION CONSULT NOTE - Follow Up Consult  Pharmacy Consult for Coumadin Indication: AVR, hx CVA  Allergies  Allergen Reactions  . Penicillins     Unknown childhood reaction   . Sulfa Antibiotics     unknown    Patient Measurements: Height: 5\' 11"  (180.3 cm) Weight: 181 lb (82.101 kg) IBW/kg (Calculated) : 75.3  Vital Signs: Temp: 97.3 F (36.3 C) (12/31 0532) Temp src: Oral (12/31 0532) BP: 134/55 mmHg (12/31 0532) Pulse Rate: 79 (12/31 0532)  Labs:  Recent Labs  03/25/13 2046 03/25/13 2103 03/25/13 2129 03/26/13 0820 03/27/13 0520  HGB 14.8 15.0  --  13.9  --   HCT 42.0 44.0  --  39.3  --   PLT 206  --   --  195  --   LABPROT  --   --  26.7* 26.0* 21.1*  INR  --   --  2.57* 2.48* 1.89*  CREATININE 1.28 1.40*  --  1.33  --     Estimated Creatinine Clearance: 47.2 ml/min (by C-G formula based on Cr of 1.33).   Medications:  Scheduled:  . aspirin EC  81 mg Oral Daily  . atorvastatin  40 mg Oral QPM  . cholecalciferol  1,000 Units Oral q morning - 10a  . furosemide  40 mg Oral Daily  . lisinopril  40 mg Oral Daily   And  . lisinopril  20 mg Oral QHS  . omega-3 acid ethyl esters  1 g Oral Daily  . pantoprazole  80 mg Oral Daily  . [START ON 04/01/2013] warfarin  2 mg Oral Q Mon-1800  . warfarin  4 mg Oral Custom  . Warfarin - Pharmacist Dosing Inpatient   Does not apply q1800    Assessment: 79 yo M with new TIA/ confusion, now resolved.  Noted plans for discharge today.  INR slightly subtherapeutic on home Coumadin regimen.  Recommend continuing home regimen - patient scheduled to get 4mg  tonight.  Goal of Therapy:  INR 2-3   Plan:  Continue home regimen - Coumadin 4mg  daily except 2mg  on Monday  Rox Mcgriff, Pharm.D., BCPS Clinical Pharmacist Pager [phone] 03/27/2013 11:10 AM

## 2013-03-27 NOTE — Discharge Summary (Signed)
Physician Discharge Summary  Timothy Durham WUJ:[id number] DOB: [birth year] DOA: 03/25/2013  PCP: No primary provider on file.  Admit date: 03/25/2013 Discharge date: 03/27/2013  Time spent: 35 minutes  Recommendations for Outpatient Follow-up:  1. Follow up with PCP in 1-2 weeks 2. Follow up with Stroke Clinic at [facility] in 2 months 3. Repeat INR in 3 days  Discharge Diagnoses:  Principal Problem:   TIA (transient ischemic attack) Active Problems:   Hypertension   Coronary artery disease   Stroke   CKD (chronic kidney disease), stage III   Discharge Condition: Improved  Diet recommendation: cardiac  Filed Weights   03/25/13 2040 03/26/13 0137  Weight: 85.276 kg (188 lb) 82.101 kg (181 lb)    History of present illness:  Timothy Durham is a 79 y.o. Caucasian male hypertension, coronary disease, CVA in July of 2000 and January of 2007 on chronic anticoagulation, chronic kidney disease stage III, and arthritis who presents with the above complaints. Patient symptoms started yesterday around 725 p.m. with speech difficulties and confusion which were noted by the wife. She called 911. Symptoms last only for a few minutes (patient reports 2-3 minutes). He presented to the emergency department for further evaluation. He was seen by neurology, recommended hospitalist service admit the patient for further care and management. Patient denies any recent fevers, chills, nausea, vomiting, chest pain, shortness of breath, abdominal pain, diarrhea, headaches or vision changes.  Hospital Course:  TIA  - Monitor the patient on telemetry.  -MRI/MRA of the brain with multiple remote infarcts, no acute infarct seen  -2-D echocardiogram done, severe concentric hypertrophy with mod global hypokinesis with regional variation, ef 40-45% -Carotid Dopplers with 1-39% right internal carotid artery stenosis. The left internal carotid artery demonstrates elevated peak systolic velocities suggestive of 80-99%  stenosis, elevated end diastolic velocities suggestive of 40-59% stenosis, and ICA/CCA ratio suggestive of upper range 40-59% stenosis. The left ECA demonstrates elevated velocities suggestive of stenosis. Vertebral arteries are patent with antegrade flow. -Continue aspirin. Continue anticoagulation on Coumadin.  -Appreciate neurology following the patient  Hypertension  Stable. Continue home antihypertensive medication.  Coronary artery disease  Stable. Continue home medications.  History of CVA  Management as indicated above.  Chronic kidney disease stage III  No prior creatinine for comparison. Continue to monitor.  Prophylaxis  Therapeutic INR.  Hx of heart valve replacement - INR 1.7 on day of discharge - Pt declined staying for management of INR - Instead, pt agrees to lovenox bridge until INR becomes therapeutic  Procedures:  Per above  Consultations:  Neurology  Discharge Exam: Filed Vitals:   03/26/13 2031 03/26/13 2231 03/27/13 0532 03/27/13 1107  BP: 137/41 128/40 134/55 134/74  Pulse: 69 80 79   Temp: 98.1 F (36.7 C)  97.3 F (36.3 C)   TempSrc: Oral  Oral   Resp: 20  19   Height:      Weight:      SpO2: 98%  95%     General: Awake, in nad Cardiovascular: regular, s1, s2 Respiratory: normal resp effort, no wheezing  Discharge Instructions     Medication List         acetaminophen 500 MG tablet  Commonly known as:  TYLENOL  Take 1,000 mg by mouth every evening.     alendronate 70 MG tablet  Commonly known as:  FOSAMAX  Take 70 mg by mouth once a week. sunday     amLODipine 2.5 MG tablet  Commonly known as:  NORVASC  Take 2.5 mg by mouth every evening.     aspirin EC 81 MG tablet  Take 81 mg by mouth every morning.     atorvastatin 40 MG tablet  Commonly known as:  LIPITOR  Take 40 mg by mouth every evening.     CALCIUM 600 + D PO  Take 2 tablets by mouth every morning.     cholecalciferol 1000 UNITS tablet  Commonly known as:   VITAMIN D  Take 1,000 Units by mouth every morning.     enoxaparin 40 MG/0.4ML injection  Commonly known as:  LOVENOX  Inject 0.8 mLs (80 mg total) into the skin every 12 (twelve) hours.     Fish Oil 1200 MG Caps  Take 1 capsule by mouth every morning.     furosemide 40 MG tablet  Commonly known as:  LASIX  Take 40 mg by mouth daily.     hydrochlorothiazide 25 MG tablet  Commonly known as:  HYDRODIURIL  Take 25 mg by mouth every morning.     omeprazole 40 MG capsule  Commonly known as:  PRILOSEC  Take 40 mg by mouth every morning.     quinapril 40 MG tablet  Commonly known as:  ACCUPRIL  Take 20-40 mg by mouth 2 (two) times daily. 40 mg a.m. And 20mg  p.m.     warfarin 4 MG tablet  Commonly known as:  COUMADIN  Take 2-4 mg by mouth every morning. 4 mg every morning except on mondays pt takes 2mg        Allergies  Allergen Reactions  . Penicillins     Unknown childhood reaction   . Sulfa Antibiotics     unknown   Follow-up Information   Schedule an appointment as soon as possible for a visit with follow up with PCP in 1-2 weeks.      Follow up with Gates Rigg, MD. Schedule an appointment as soon as possible for a visit in 2 months. (stroke clinic)    Specialties:  Neurology, Radiology   Contact information:   [address] [place] [address] [phone]        The results of significant diagnostics from this hospitalization (including imaging, microbiology, ancillary and laboratory) are listed below for reference.    Significant Diagnostic Studies: Ct Head Wo Contrast  03/25/2013   CLINICAL DATA:  Hypotension in short of breath. Near syncope. Mumbling and decreased response. History of stroke.  EXAM: CT HEAD WITHOUT CONTRAST  TECHNIQUE: Contiguous axial images were obtained from the base of the skull through the vertex without intravenous contrast.  COMPARISON:  None.  FINDINGS: Diffuse cerebral atrophy. Ventricular dilatation consistent  with central atrophy. Patchy white matter changes consistent with small vessel ischemia. Focal areas of encephalomalacia in the right posterior frontal lobe and in the right occipital lobe consistent with old infarcts. There is no mass effect or midline shift. No abnormal extra-axial fluid collections. Gray-white matter junctions are distinct. Basal cisterns are not effaced. No evidence of acute intracranial hemorrhage. Visualized paranasal sinuses are not opacified. Vascular calcifications. Calvarium appears intact.  IMPRESSION: No acute intracranial abnormalities. Old infarcts in the right posterior frontal and right occipital regions. Chronic atrophy and small vessel ischemic changes. The   Electronically Signed   By: Burman Nieves M.D.   On: 03/25/2013 21:50   Mri Brain Without Contrast  03/26/2013   CLINICAL DATA:  79 year old male with hypertension and coronary artery disease presenting with speech difficulty and confusion. Symptoms lasted for a  few minutes.  EXAM: MRI HEAD WITHOUT CONTRAST  MRA HEAD WITHOUT CONTRAST  TECHNIQUE: Multiplanar, multiecho pulse sequences of the brain and surrounding structures were obtained without intravenous contrast. Angiographic images of the head were obtained using MRA technique without contrast.  COMPARISON:  03/25/2013 CT.  No comparison MR.  FINDINGS: MRI HEAD FINDINGS  No acute infarct.  Remote right posterior frontal infarct with encephalomalacia most prominent peri operculum region. On diffusion sequence, T2 shine through posterior aspect of the remote infarct.  Remote very small left frontal lobe infarct.  Remote small to slightly moderate-size right occipital lobe infarct.  Mild small vessel disease type changes.  Scattered blood breakdown products most prominent left hemisphere may be related to prior episodes hemorrhagic ischemia. Result of prior trauma or cavernomas felt to be secondary considerations.  Global atrophy without hydrocephalus.  No intracranial  mass lesion noted on this unenhanced exam.  Partial opacification mastoid air cells greater on left. No obstructing lesion at the drainage of the eustachian tubes noted. Mild paranasal sinus mucosal thickening.  Cervical medullary junction, pituitary region, pineal region and orbital structures unremarkable.  MRA HEAD FINDINGS  Mild narrowing of the supraclinoid segment of the internal carotid artery bilaterally. Anterior circulation otherwise without medium or large size vessel significant stenosis or occlusion.  Mild irregularity and narrowing of portions of the middle cerebral artery branches bilaterally.  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  Only a small portion of the distal vertebral arteries was imaged on the current examination. No significant narrowing of the visualized portions of the distal vertebral arteries. The full extent of the posterior inferior cerebellar arteries was not imaged on the current exam.  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 No significant stenosis of the basilar artery.  Non visualization left anterior inferior cerebellar artery. Narrowing of the right anterior inferior cerebellar artery.  Narrowing of the superior cerebellar arteries more notable on the right.  Mild irregularity narrowing distal posterior cerebral artery branches.  IMPRESSION: No acute infarct.  Remote infarcts as noted above.  Intracranial atherosclerotic type changes as detailed above.  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  Please see above for further detail.   Electronically Signed   By: Bridgett Larsson M.D.   On: 03/26/2013 12:04   Mr Maxine Glenn Head/brain Wo Cm  03/26/2013   CLINICAL DATA:  79 year old male with hypertension and coronary artery disease presenting with speech difficulty and confusion. Symptoms lasted for a few minutes.  EXAM: MRI HEAD WITHOUT CONTRAST  MRA HEAD WITHOUT CONTRAST  TECHNIQUE: Multiplanar, multiecho  pulse sequences of the brain and surrounding structures were obtained without intravenous contrast. Angiographic images of the head were obtained using MRA technique without contrast.  COMPARISON:  03/25/2013 CT.  No comparison MR.  FINDINGS: MRI HEAD FINDINGS  No acute infarct.  Remote right posterior frontal infarct with encephalomalacia most prominent peri operculum region. On diffusion sequence, T2 shine through posterior aspect of the remote infarct.  Remote very small left frontal lobe infarct.  Remote small to slightly moderate-size right occipital lobe infarct.  Mild small vessel disease type changes.  Scattered blood breakdown products most prominent left hemisphere may be related to prior episodes hemorrhagic ischemia. Result of prior trauma or cavernomas felt to be secondary considerations.  Global atrophy without hydrocephalus.  No intracranial mass lesion noted on this unenhanced exam.  Partial opacification mastoid air cells greater on left. No obstructing lesion at the drainage of the eustachian tubes noted. Mild  paranasal sinus mucosal thickening.  Cervical medullary junction, pituitary region, pineal region and orbital structures unremarkable.  MRA HEAD FINDINGS  Mild narrowing of the supraclinoid segment of the internal carotid artery bilaterally. Anterior circulation otherwise without medium or large size vessel significant stenosis or occlusion.  Mild irregularity and narrowing of portions of the middle cerebral artery branches bilaterally.  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  Only a small portion of the distal vertebral arteries was imaged on the current examination. No significant narrowing of the visualized portions of the distal vertebral arteries. The full extent of the posterior inferior cerebellar arteries was not imaged on the current exam.  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 No significant stenosis of the basilar artery.  Non  visualization left anterior inferior cerebellar artery. Narrowing of the right anterior inferior cerebellar artery.  Narrowing of the superior cerebellar arteries more notable on the right.  Mild irregularity narrowing distal posterior cerebral artery branches.  IMPRESSION: No acute infarct.  Remote infarcts as noted above.  Intracranial atherosclerotic type changes as detailed above.  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  Please see above for further detail.   Electronically Signed   By: Bridgett Larsson M.D.   On: 03/26/2013 12:04    Microbiology: No results found for this or any previous visit (from the past 240 hour(s)).   Labs: Basic Metabolic Panel:  Recent Labs Lab 03/25/13 2046 03/25/13 2103 03/26/13 0820  NA 141 143 143  K 4.0 3.9 4.8  CL 107 106 108  CO2 22  --  23  GLUCOSE 101* 98 89  BUN 29* 30* 31*  CREATININE 1.28 1.40* 1.33  CALCIUM 7.8*  --  8.0*   Liver Function Tests: No results found for this basename: AST, ALT, ALKPHOS, BILITOT, PROT, ALBUMIN,  in the last 168 hours No results found for this basename: LIPASE, AMYLASE,  in the last 168 hours No results found for this basename: AMMONIA,  in the last 168 hours CBC:  Recent Labs Lab 03/25/13 2046 03/25/13 2103 03/26/13 0820  WBC 14.8*  --  7.4  HGB 14.8 15.0 13.9  HCT 42.0 44.0 39.3  MCV 96.1  --  93.8  PLT 206  --  195   Cardiac Enzymes: No results found for this basename: CKTOTAL, CKMB, CKMBINDEX, TROPONINI,  in the last 168 hours BNP: BNP (last 3 results) No results found for this basename: PROBNP,  in the last 8760 hours CBG:  Recent Labs Lab 03/25/13 2058 03/26/13 2227 03/27/13 0619 03/27/13 1126  GLUCAP 103* 93 87 81     Signed:  Arif Amendola K  [facility] 03/27/2013, 1:22 PM

## 2013-03-27 NOTE — Care Management Note (Signed)
    Page 1 of 1   03/27/2013     4:24:57 PM   CARE MANAGEMENT NOTE 03/27/2013  Patient:  DEVEION, DENZ   Account Number:  [id number]  Date Initiated:  03/27/2013  Documentation initiated by:  Berneta Sconyers  Subjective/Objective Assessment:   PT ADM ON 03/25/13 WITH SYNCOPE, SLURRED SPEECH.  PTA, PT INDEPENDENT, LIVES WITH SPOUSE.     Action/Plan:   PT TO DC HOME ON LOVENOX.  CALLED RX IN PT'S PHARMACY. COPAY QUOTED AS $20.  PHARMACIST STATES WILL HAVE ENOUGH OF MED TO LAST UNTIL FRIDAY, BUT WILL HAVE TO ORDER; PT CAN PICK UP REMAINDER ON FRI.  PT MADE AWARE OF COPAY AND PHARMACY INFO.   Anticipated DC Date:  03/27/2013   Anticipated DC Plan:  HOME/SELF CARE      DC Planning Services  CM consult  Medication Assistance      Choice offered to / List presented to:             Status of service:  Completed, signed off Medicare Important Message given?   (If response is "NO", the following Medicare IM given date fields will be blank) Date Medicare IM given:   Date Additional Medicare IM given:    Discharge Disposition:  HOME/SELF CARE  Per UR Regulation:  Reviewed for med. necessity/level of care/duration of stay  If discussed at Long Length of Stay Meetings, dates discussed:    Comments:

## 2013-12-26 DIAGNOSIS — I2510 Atherosclerotic heart disease of native coronary artery without angina pectoris: Secondary | ICD-10-CM

## 2013-12-26 HISTORY — DX: Atherosclerotic heart disease of native coronary artery without angina pectoris: I25.10

## 2013-12-26 HISTORY — PX: CORONARY ANGIOGRAM: SHX5786

## 2014-01-12 ENCOUNTER — Emergency Department (HOSPITAL_COMMUNITY): Payer: Medicare HMO

## 2014-01-12 ENCOUNTER — Encounter (HOSPITAL_COMMUNITY): Payer: Self-pay | Admitting: Emergency Medicine

## 2014-01-12 ENCOUNTER — Inpatient Hospital Stay (HOSPITAL_COMMUNITY)
Admission: EM | Admit: 2014-01-12 | Discharge: 2014-01-16 | DRG: 286 | Disposition: A | Discharge status: Home / Self Care | Payer: Medicare HMO | Attending: Internal Medicine | Admitting: Internal Medicine

## 2014-01-12 DIAGNOSIS — R7989 Other specified abnormal findings of blood chemistry: Secondary | ICD-10-CM

## 2014-01-12 DIAGNOSIS — I69954 Hemiplegia and hemiparesis following unspecified cerebrovascular disease affecting left non-dominant side: Secondary | ICD-10-CM | POA: Diagnosis not present

## 2014-01-12 DIAGNOSIS — I2510 Atherosclerotic heart disease of native coronary artery without angina pectoris: Secondary | ICD-10-CM | POA: Diagnosis present

## 2014-01-12 DIAGNOSIS — I129 Hypertensive chronic kidney disease with stage 1 through stage 4 chronic kidney disease, or unspecified chronic kidney disease: Secondary | ICD-10-CM | POA: Diagnosis present

## 2014-01-12 DIAGNOSIS — I447 Left bundle-branch block, unspecified: Secondary | ICD-10-CM | POA: Diagnosis present

## 2014-01-12 DIAGNOSIS — Z7982 Long term (current) use of aspirin: Secondary | ICD-10-CM | POA: Diagnosis not present

## 2014-01-12 DIAGNOSIS — I4891 Unspecified atrial fibrillation: Secondary | ICD-10-CM

## 2014-01-12 DIAGNOSIS — I5031 Acute diastolic (congestive) heart failure: Secondary | ICD-10-CM | POA: Insufficient documentation

## 2014-01-12 DIAGNOSIS — Z952 Presence of prosthetic heart valve: Secondary | ICD-10-CM | POA: Diagnosis not present

## 2014-01-12 DIAGNOSIS — I272 Other secondary pulmonary hypertension: Secondary | ICD-10-CM | POA: Diagnosis present

## 2014-01-12 DIAGNOSIS — N183 Chronic kidney disease, stage 3 unspecified: Secondary | ICD-10-CM

## 2014-01-12 DIAGNOSIS — E559 Vitamin D deficiency, unspecified: Secondary | ICD-10-CM | POA: Diagnosis present

## 2014-01-12 DIAGNOSIS — I480 Paroxysmal atrial fibrillation: Secondary | ICD-10-CM | POA: Diagnosis present

## 2014-01-12 DIAGNOSIS — K219 Gastro-esophageal reflux disease without esophagitis: Secondary | ICD-10-CM | POA: Diagnosis present

## 2014-01-12 DIAGNOSIS — I739 Peripheral vascular disease, unspecified: Secondary | ICD-10-CM | POA: Diagnosis present

## 2014-01-12 DIAGNOSIS — I248 Other forms of acute ischemic heart disease: Secondary | ICD-10-CM | POA: Diagnosis present

## 2014-01-12 DIAGNOSIS — I340 Nonrheumatic mitral (valve) insufficiency: Secondary | ICD-10-CM | POA: Diagnosis present

## 2014-01-12 DIAGNOSIS — Z7901 Long term (current) use of anticoagulants: Secondary | ICD-10-CM | POA: Diagnosis not present

## 2014-01-12 DIAGNOSIS — Z87891 Personal history of nicotine dependence: Secondary | ICD-10-CM | POA: Diagnosis not present

## 2014-01-12 DIAGNOSIS — E876 Hypokalemia: Secondary | ICD-10-CM | POA: Diagnosis present

## 2014-01-12 DIAGNOSIS — I429 Cardiomyopathy, unspecified: Secondary | ICD-10-CM | POA: Diagnosis present

## 2014-01-12 DIAGNOSIS — Z951 Presence of aortocoronary bypass graft: Secondary | ICD-10-CM | POA: Diagnosis not present

## 2014-01-12 DIAGNOSIS — J9600 Acute respiratory failure, unspecified whether with hypoxia or hypercapnia: Secondary | ICD-10-CM | POA: Diagnosis present

## 2014-01-12 DIAGNOSIS — I10 Essential (primary) hypertension: Secondary | ICD-10-CM

## 2014-01-12 DIAGNOSIS — Z823 Family history of stroke: Secondary | ICD-10-CM | POA: Diagnosis not present

## 2014-01-12 DIAGNOSIS — I5041 Acute combined systolic (congestive) and diastolic (congestive) heart failure: Secondary | ICD-10-CM | POA: Diagnosis present

## 2014-01-12 DIAGNOSIS — I059 Rheumatic mitral valve disease, unspecified: Secondary | ICD-10-CM

## 2014-01-12 DIAGNOSIS — E785 Hyperlipidemia, unspecified: Secondary | ICD-10-CM | POA: Diagnosis present

## 2014-01-12 DIAGNOSIS — R778 Other specified abnormalities of plasma proteins: Secondary | ICD-10-CM

## 2014-01-12 DIAGNOSIS — I509 Heart failure, unspecified: Secondary | ICD-10-CM

## 2014-01-12 DIAGNOSIS — I5043 Acute on chronic combined systolic (congestive) and diastolic (congestive) heart failure: Secondary | ICD-10-CM

## 2014-01-12 HISTORY — DX: Cardiac arrhythmia, unspecified: I49.9

## 2014-01-12 LAB — BASIC METABOLIC PANEL
ANION GAP: 17 — AB (ref 5–15)
BUN: 21 mg/dL (ref 6–23)
CALCIUM: 8.9 mg/dL (ref 8.4–10.5)
CHLORIDE: 102 meq/L (ref 96–112)
CO2: 23 mEq/L (ref 19–32)
Creatinine, Ser: 1.21 mg/dL (ref 0.50–1.35)
GFR calc non Af Amer: 54 mL/min — ABNORMAL LOW (ref >90)
GFR, EST AFRICAN AMERICAN: 63 mL/min — AB (ref >90)
Glucose, Bld: 121 mg/dL — ABNORMAL HIGH (ref 70–99)
Potassium: 3.4 mEq/L — ABNORMAL LOW (ref 3.7–5.3)
SODIUM: 142 meq/L (ref 137–147)

## 2014-01-12 LAB — CBC WITH DIFFERENTIAL/PLATELET
Basophils Absolute: 0 10*3/uL (ref 0.0–0.1)
Basophils Relative: 0 % (ref 0–1)
Eosinophils Absolute: 0.3 10*3/uL (ref 0.0–0.7)
Eosinophils Relative: 3 % (ref 0–5)
HEMATOCRIT: 42.5 % (ref 39.0–52.0)
HEMOGLOBIN: 14.9 g/dL (ref 13.0–17.0)
LYMPHS ABS: 1.8 10*3/uL (ref 0.7–4.0)
LYMPHS PCT: 21 % (ref 12–46)
MCH: 33.5 pg (ref 26.0–34.0)
MCHC: 35.1 g/dL (ref 30.0–36.0)
MCV: 95.5 fL (ref 78.0–100.0)
MONO ABS: 0.5 10*3/uL (ref 0.1–1.0)
MONOS PCT: 7 % (ref 3–12)
NEUTROS ABS: 5.7 10*3/uL (ref 1.7–7.7)
Neutrophils Relative %: 69 % (ref 43–77)
Platelets: 228 10*3/uL (ref 150–400)
RBC: 4.45 MIL/uL (ref 4.22–5.81)
RDW: 14.1 % (ref 11.5–15.5)
WBC: 8.2 10*3/uL (ref 4.0–10.5)

## 2014-01-12 LAB — TROPONIN I
TROPONIN I: 0.3 ng/mL — AB (ref <0.30)
Troponin I: <0.3 ng/mL (ref <0.30)
Troponin I: 0.54 ng/mL (ref <0.30)
Troponin I: 0.54 ng/mL (ref <0.30)

## 2014-01-12 LAB — PROTIME-INR
INR: 2.58 — ABNORMAL HIGH (ref 0.00–1.49)
Prothrombin Time: 27.9 seconds — ABNORMAL HIGH (ref 11.6–15.2)

## 2014-01-12 LAB — PRO B NATRIURETIC PEPTIDE: PRO B NATRI PEPTIDE: 4853 pg/mL — AB (ref 0–450)

## 2014-01-12 MED ORDER — WARFARIN SODIUM 1 MG PO TABS
1.0000 mg | ORAL_TABLET | ORAL | Status: DC
  Filled 2014-01-12: qty 1

## 2014-01-12 MED ORDER — WARFARIN SODIUM 4 MG PO TABS
4.0000 mg | ORAL_TABLET | ORAL | Status: DC
  Filled 2014-01-12: qty 1

## 2014-01-12 MED ORDER — CLOPIDOGREL BISULFATE 75 MG PO TABS
75.0000 mg | ORAL_TABLET | Freq: Every day | ORAL | Status: DC
  Administered 2014-01-12 – 2014-01-14 (×3): 75 mg via ORAL
  Filled 2014-01-12 (×4): qty 1

## 2014-01-12 MED ORDER — LEVALBUTEROL HCL 0.63 MG/3ML IN NEBU
0.6300 mg | INHALATION_SOLUTION | Freq: Four times a day (QID) | RESPIRATORY_TRACT | Status: DC | PRN
  Administered 2014-01-15: 0.63 mg via RESPIRATORY_TRACT
  Filled 2014-01-12: qty 3

## 2014-01-12 MED ORDER — DILTIAZEM HCL 25 MG/5ML IV SOLN
20.0000 mg | Freq: Once | INTRAVENOUS | Status: AC
  Administered 2014-01-12: 20 mg via INTRAVENOUS
  Filled 2014-01-12: qty 5

## 2014-01-12 MED ORDER — DILTIAZEM HCL 100 MG IV SOLR
5.0000 mg/h | Freq: Once | INTRAVENOUS | Status: AC
  Administered 2014-01-12: 10 mg/h via INTRAVENOUS

## 2014-01-12 MED ORDER — ONDANSETRON HCL 4 MG PO TABS: 4.0000 mg | ORAL_TABLET | Freq: Four times a day (QID) | ORAL | Status: DC | PRN

## 2014-01-12 MED ORDER — FUROSEMIDE 10 MG/ML IJ SOLN
40.0000 mg | Freq: Once | INTRAMUSCULAR | Status: AC
  Administered 2014-01-12: 40 mg via INTRAVENOUS
  Filled 2014-01-12: qty 4

## 2014-01-12 MED ORDER — FUROSEMIDE 10 MG/ML IJ SOLN
40.0000 mg | Freq: Two times a day (BID) | INTRAMUSCULAR | Status: DC
  Administered 2014-01-12 – 2014-01-13 (×2): 40 mg via INTRAVENOUS
  Filled 2014-01-12 (×4): qty 4

## 2014-01-12 MED ORDER — ONDANSETRON HCL 4 MG/2ML IJ SOLN: 4.0000 mg | Freq: Four times a day (QID) | INTRAMUSCULAR | Status: DC | PRN

## 2014-01-12 MED ORDER — VITAMIN D3 25 MCG (1000 UNIT) PO TABS
1000.0000 [IU] | ORAL_TABLET | Freq: Every morning | ORAL | Status: DC
  Administered 2014-01-12 – 2014-01-16 (×5): 1000 [IU] via ORAL
  Filled 2014-01-12 (×5): qty 1

## 2014-01-12 MED ORDER — QUINAPRIL HCL 10 MG PO TABS
20.0000 mg | ORAL_TABLET | Freq: Two times a day (BID) | ORAL | Status: DC
  Administered 2014-01-12 (×2): 20 mg via ORAL
  Filled 2014-01-12 (×4): qty 2

## 2014-01-12 MED ORDER — WARFARIN SODIUM 1 MG PO TABS
1.0000 mg | ORAL_TABLET | ORAL | Status: DC
  Administered 2014-01-12: 1 mg via ORAL
  Filled 2014-01-12: qty 1

## 2014-01-12 MED ORDER — ACETAMINOPHEN 325 MG PO TABS
650.0000 mg | ORAL_TABLET | Freq: Four times a day (QID) | ORAL | Status: DC | PRN
  Administered 2014-01-13 (×2): 650 mg via ORAL
  Filled 2014-01-12 (×2): qty 2

## 2014-01-12 MED ORDER — METOPROLOL TARTRATE 25 MG PO TABS
25.0000 mg | ORAL_TABLET | Freq: Two times a day (BID) | ORAL | Status: DC
  Administered 2014-01-12 – 2014-01-16 (×8): 25 mg via ORAL
  Filled 2014-01-12 (×10): qty 1

## 2014-01-12 MED ORDER — POTASSIUM CHLORIDE CRYS ER 20 MEQ PO TBCR
30.0000 meq | EXTENDED_RELEASE_TABLET | Freq: Two times a day (BID) | ORAL | Status: DC
  Administered 2014-01-12 (×2): 30 meq via ORAL
  Filled 2014-01-12 (×4): qty 1

## 2014-01-12 MED ORDER — ACETAMINOPHEN 650 MG RE SUPP: 650.0000 mg | Freq: Four times a day (QID) | RECTAL | Status: DC | PRN

## 2014-01-12 MED ORDER — LORAZEPAM 0.5 MG PO TABS
0.5000 mg | ORAL_TABLET | Freq: Once | ORAL | Status: DC
  Filled 2014-01-12: qty 1

## 2014-01-12 MED ORDER — WARFARIN - PHARMACIST DOSING INPATIENT
Freq: Every day | Status: DC
  Administered 2014-01-12: 18:00:00

## 2014-01-12 MED ORDER — IPRATROPIUM-ALBUTEROL 0.5-2.5 (3) MG/3ML IN SOLN
3.0000 mL | RESPIRATORY_TRACT | Status: DC | PRN
  Administered 2014-01-13 – 2014-01-14 (×2): 3 mL via RESPIRATORY_TRACT
  Filled 2014-01-12 (×2): qty 3

## 2014-01-12 MED ORDER — ATORVASTATIN CALCIUM 40 MG PO TABS
40.0000 mg | ORAL_TABLET | Freq: Every evening | ORAL | Status: DC
  Administered 2014-01-12 – 2014-01-15 (×4): 40 mg via ORAL
  Filled 2014-01-12 (×5): qty 1

## 2014-01-12 MED ORDER — PANTOPRAZOLE SODIUM 40 MG PO TBEC
40.0000 mg | DELAYED_RELEASE_TABLET | Freq: Every day | ORAL | Status: DC
  Administered 2014-01-12 – 2014-01-16 (×5): 40 mg via ORAL
  Filled 2014-01-12 (×5): qty 1

## 2014-01-12 MED ORDER — ALLOPURINOL 100 MG PO TABS
200.0000 mg | ORAL_TABLET | Freq: Every day | ORAL | Status: DC
  Administered 2014-01-12 – 2014-01-16 (×5): 200 mg via ORAL
  Filled 2014-01-12 (×5): qty 2

## 2014-01-12 NOTE — Progress Notes (Signed)
Patient refused CPAP at this time.  States that he cannot tolerate CPAP.

## 2014-01-12 NOTE — ED Notes (Signed)
Placed pt on 2 L nasal cannula SpO2 at 96%.

## 2014-01-12 NOTE — H&P (Signed)
[facility] History and Physical  Timothy Durham ZOX:[id number] DOB: [birth year] DOA: 01/12/2014  Referring physician:  PCP: Timothy BastABEZA,Timothy Durham  Specialists:   Chief Complaint: SOB  HPI: Timothy Durham is a 80 y.o. male with PMH of HTN, CKD, PAD s/p stent (plavix), CVA with residual L sided weakness,   s/p AVR, A fib (coumadin), CHF (not on diuretics x 6 month) presented progressive SOB, DOE, some PNDs for few days, associated with mild leg edema and found to have acuet CHF, A fib RVR in ED;  He denies chest pain, no fever, chronic mild productive cough, no nausea, vomiting or diarrhea   Review of Systems: The patient denies anorexia, fever, weight loss,, vision loss, decreased hearing, hoarseness, chest pain, syncope, dyspnea on exertion, peripheral edema, balance deficits, hemoptysis, abdominal pain, melena, hematochezia, severe indigestion/heartburn, hematuria, incontinence, genital sores, muscle weakness, suspicious skin lesions, transient blindness, difficulty walking, depression, unusual weight change, abnormal bleeding, enlarged lymph nodes, angioedema, and breast masses.    Past Medical History  Diagnosis Date  . Arthritis   . Coronary artery disease   . Hypertension   . Stroke     July 2000  January 2007  . Renal disorder     non acute  . GERD (gastroesophageal reflux disease)   . Foot pain, bilateral   . H/O hematuria   . Hyperlipidemia   . Vitamin D deficiency    Past Surgical History  Procedure Laterality Date  . Aortic valve replacement (avr)/coronary artery bypass grafting (cabg)  12/2008  . Eye surgery    . Carotid angiogram  1996   Social History:  reports that he quit smoking about 15 years ago. He does not have any smokeless tobacco history on file. He reports that he drinks about .6 ounces of alcohol per week. He reports that he does not use illicit drugs. Home;  where does patient live--home, ALF, SNF? and with whom if at home? Yes;  Can patient participate in  ADLs?  Allergies  Allergen Reactions  . Penicillins     Unknown childhood reaction   . Sulfa Antibiotics     unknown    Family History  Problem Relation Age of Onset  . Stroke Mother   . Cancer Father     (be sure to complete)  Prior to Admission medications   Medication Sig Start Date End Date Taking? Authorizing Provider  acetaminophen (TYLENOL) 500 MG tablet Take 1,000 mg by mouth every 6 (six) hours as needed for mild pain or headache.    Yes Historical Provider, Durham  alendronate (FOSAMAX) 70 MG tablet Take 70 mg by mouth once a week. sunday 02/28/13  Yes Historical Provider, Durham  allopurinol (ZYLOPRIM) 100 MG tablet Take 200 mg by mouth daily.   Yes Historical Provider, Durham  amLODipine (NORVASC) 2.5 MG tablet Take 2.5 mg by mouth every evening.  03/12/13  Yes Historical Provider, Durham  atorvastatin (LIPITOR) 40 MG tablet Take 40 mg by mouth every evening.   Yes Historical Provider, Durham  Calcium Carb-Cholecalciferol (CALCIUM 600 + D PO) Take 2 tablets by mouth every morning.   Yes Historical Provider, Durham  cholecalciferol (VITAMIN D) 1000 UNITS tablet Take 1,000 Units by mouth every morning.   Yes Historical Provider, Durham  clopidogrel (PLAVIX) 75 MG tablet Take 75 mg by mouth daily.   Yes Historical Provider, Durham  furosemide (LASIX) 40 MG tablet Take 40 mg by mouth daily. 02/28/13  Yes Historical Provider, Durham  Omega-3 Fatty Acids (FISH OIL) 1200 MG  CAPS Take 1 capsule by mouth every morning.   Yes Historical Provider, Durham  omeprazole (PRILOSEC) 40 MG capsule Take 40 mg by mouth every morning.   Yes Historical Provider, Durham  quinapril (ACCUPRIL) 40 MG tablet Take 20-40 mg by mouth 2 (two) times daily. 40 mg a.m. And 20mg  p.m.   Yes Historical Provider, Durham  warfarin (COUMADIN) 1 MG tablet Take 1 mg by mouth once a week. On Sunday   Yes Historical Provider, Durham  warfarin (COUMADIN) 4 MG tablet Take 4 mg by mouth See admin instructions. Monday-Saturday 02/24/13  Yes Historical Provider, Durham    Physical Exam: Filed Vitals:   01/12/14 0745  BP: 131/40  Pulse: 76  Temp:   Resp: 15     General:  alert  Eyes: eomi-  ENT: no oral ulcers   Neck: supple, JVD  Cardiovascular: s1, s2 poor, irregular   Respiratory: BL crackles   Abdomen: soft, nt,nd   Skin: no rash   Musculoskeletal: Leg edema  Psychiatric: no hallucinations   Neurologic: CN 2-12 intact, motor 5/5 BL symmetric,   Labs on Admission:  Basic Metabolic Panel:  Recent Labs Lab 01/12/14 0538  NA 142  K 3.4*  CL 102  CO2 23  GLUCOSE 121*  BUN 21  CREATININE 1.21  CALCIUM 8.9   Liver Function Tests: No results found for this basename: AST, ALT, ALKPHOS, BILITOT, PROT, ALBUMIN,  in the last 168 hours No results found for this basename: LIPASE, AMYLASE,  in the last 168 hours No results found for this basename: AMMONIA,  in the last 168 hours CBC:  Recent Labs Lab 01/12/14 0538  WBC 8.2  NEUTROABS 5.7  HGB 14.9  HCT 42.5  MCV 95.5  PLT 228   Cardiac Enzymes:  Recent Labs Lab 01/12/14 0538  TROPONINI <0.30    BNP (last 3 results)  Recent Labs  01/12/14 0539  PROBNP 4853.0*   CBG: No results found for this basename: GLUCAP,  in the last 168 hours  Radiological Exams on Admission: Dg Chest Portable 1 View  01/12/2014   CLINICAL DATA:  Shortness of breath. Atrial fibrillation. Initial encounter  EXAM: PORTABLE CHEST - 1 VIEW  COMPARISON:  None.  FINDINGS: Diffuse interstitial and airspace opacity which is symmetric. Small pleural effusions are present. Background pulmonary hyperinflation. There is cardiomegaly. Previous median sternotomy for aortic valve replacement.  IMPRESSION: 1. CHF. 2. Probable background COPD. 3. Aortic valve replacement.   Electronically Signed   By: Tiburcio Pea M.D.   On: 01/12/2014 05:58    EKG: Independently reviewed.   Assessment/Plan Active Problems:   CHF exacerbation   80 y.o. male with PMH of HTN, CKD, PAD s/p stent (plavix), CVA  with residual L sided weakness,   s/p AVR, A fib (coumadin), CHF (not on diuretics x 6 month) presented progressive SOB, DOE, some PNDs for few days, associated with mild leg edema and found to have acuet CHF, A fib RVR in ED;   1. Acute on chronic CHF (systolic HF); echo (2014): LVEF 40-45%; s/p AVR -CXR: congestion, edema; Pt was not taking lasix for several month;  -started IV lasix 40 BID; monitor I/O, daily weight; titrate lasix as needed, cont ARB, start low dose BB -monitor on tele, ECG, trop; obtain echo; monitor renal function; replace lytes,    2. Acute respiratory failure due to CHF, with probable underlying COPD -cont oxygen, diuresis, prn bronchodilators, NiPPV as needed; outpatient PFTs 3. A fib; episode of RVR (resolved  on IV cardizem in ED) -off cardizem, cont coumadin, start BB; monitor  4. PAD s/p stent (plavix); no new symptoms; cont home regimen  5. CVA with residual L sided weakness; no new symptoms; cont home regimen  6. HTN, hold amlodipine, start BB, cont ARB, diuretics; titrate as needed   None;  if consultant consulted, please document name and whether formally or informally consulted  Code Status: full (must indicate code status--if unknown or must be presumed, indicate so) Family Communication:  D/w patient, his wife (indicate person spoken with, if applicable, with phone number if by telephone) Disposition Plan: home 2-3 days  (indicate anticipated LOS)  Time spent: >35 minutes   Esperanza Sheets [facility] Pager [phone]  If 7PM-7AM, please contact night-coverage [email or web address] Password [email or web address] 01/12/2014, 8:02 AM

## 2014-01-12 NOTE — ED Notes (Signed)
Pt arrives via EMS from home with c/o [facility] since Friday. EMS EKG shows AFIB, pt does not recall hx of same. Hx valve replacement in 2010, stroke x2 2007 and 2014. Pain 0/10. Non productive cough. 5MG  albuterol neb given, pt expresses relief. 20G IV placed in L hand.

## 2014-01-12 NOTE — Consult Note (Signed)
Reason for Consult: CHF and elevated troponin Referring Physician: Internal medicine  Primary Cardiologist: New (seen at [facility] in the past)   HPI:  The patient is an 80 year old male who we have been consulted to see for acute CHF exacerbation and elevated troponins. He has an extensive cardiovascular history. In 2000, he suffered a right-sided stroke which left him with residual left-sided weakness. This was felt to be in the setting of atrial fibrillation. Since that time he has suffered 2 TIAs, one in 2007 and the last in December 2014. He is on Coumadin for anticoagulation. His PCP follows his INR. He is also a former smoker and has subsequent peripheral vascular disease. He has had prior stenting of his left lower extremity as well as right carotid endarterectomy and failed attempt at carotid stenting on the left, all performed at outside hospitals. He is also on Plavix therapy. In 2006, he underwent tissue aortic valve replacement, performed in [place], [place]. He denies any history of CABG, coronary stenting or MI. He discontinued tobacco use in 2000 after his stroke. He reports a history of treated hypertension and hyperlipidemia. He denies any prior diagnosis of diabetes. He had an echocardiogram performed in December 2014 which revealed moderately reduced systolic function with an estimated ejection fraction of 40-45%. Grade 1 diastolic dysfunction was noted at that time.Prior to admission, he was not on any diuretics. In the past he was on hydrochlorothiazide but states that this was discontinued more than 6 months ago by his vascular doctor for reasons unknown.   He presented to [facility] earlier this morning after developing acute onset of sudden dyspnea, orthopnea and PND that occurred around 4:30 AM. He noted associated substernal to left sided chest pressure. No radiation to his neck, back or upper extremities. He denies weight gain and lower extremity edema. He has  been fully compliant with his medications and denies any recent consumption of high sodium foods.   On arrival to [facility], he was noted to be in atrial fibrillation and was started on a diltiazem drip. He spontaneously converted to normal sinus rhythm. Cardizem was discontinued. BNP was elevated at 4853. Chest x-ray demonstrates cardiomegaly with small pleural effusions. Troponins are mildly elevated x 2 at 0.30 and 0.54. He denies chest pain currently. His breathing is improved after a dose of IV Lasix. He is on 2.5 L of of supplemental O2.     Past Medical History  Diagnosis Date  . Arthritis   . Coronary artery disease   . Hypertension   . Stroke     July 2000  January 2007  . Renal disorder     non acute  . GERD (gastroesophageal reflux disease)   . Foot pain, bilateral   . H/O hematuria   . Hyperlipidemia   . Vitamin D deficiency     Past Surgical History  Procedure Laterality Date  . Aortic valve replacement (avr)/coronary artery bypass grafting (cabg)  12/2008  . Eye surgery    . Carotid angiogram  1996    Family History  Problem Relation Age of Onset  . Stroke Mother   . Cancer Father     Social History:  reports that he quit smoking about 15 years ago. He does not have any smokeless tobacco history on file. He reports that he drinks about .6 ounces of alcohol per week. He reports that he does not use illicit drugs.  Allergies:  Allergies  Allergen Reactions  . Penicillins  Unknown childhood reaction   . Sulfa Antibiotics     unknown    Medications:  Prior to Admission medications   Medication Sig Start Date End Date Taking? Authorizing Provider  acetaminophen (TYLENOL) 500 MG tablet Take 1,000 mg by mouth every 6 (six) hours as needed for mild pain or headache.    Yes Historical Provider, MD  alendronate (FOSAMAX) 70 MG tablet Take 70 mg by mouth once a week. sunday 02/28/13  Yes Historical Provider, MD  allopurinol (ZYLOPRIM) 100 MG tablet Take 200 mg  by mouth daily.   Yes Historical Provider, MD  amLODipine (NORVASC) 2.5 MG tablet Take 2.5 mg by mouth every evening.  03/12/13  Yes Historical Provider, MD  atorvastatin (LIPITOR) 40 MG tablet Take 40 mg by mouth every evening.   Yes Historical Provider, MD  Calcium Carb-Cholecalciferol (CALCIUM 600 + D PO) Take 2 tablets by mouth every morning.   Yes Historical Provider, MD  cholecalciferol (VITAMIN D) 1000 UNITS tablet Take 1,000 Units by mouth every morning.   Yes Historical Provider, MD  clopidogrel (PLAVIX) 75 MG tablet Take 75 mg by mouth daily.   Yes Historical Provider, MD  furosemide (LASIX) 40 MG tablet Take 40 mg by mouth daily. 02/28/13  Yes Historical Provider, MD  Omega-3 Fatty Acids (FISH OIL) 1200 MG CAPS Take 1 capsule by mouth every morning.   Yes Historical Provider, MD  omeprazole (PRILOSEC) 40 MG capsule Take 40 mg by mouth every morning.   Yes Historical Provider, MD  quinapril (ACCUPRIL) 40 MG tablet Take 20-40 mg by mouth 2 (two) times daily. 40 mg a.m. And 38m p.m.   Yes Historical Provider, MD  warfarin (COUMADIN) 1 MG tablet Take 1 mg by mouth once a week. On Sunday   Yes Historical Provider, MD  warfarin (COUMADIN) 4 MG tablet Take 4 mg by mouth See admin instructions. Monday-Saturday 02/24/13  Yes Historical Provider, MD     Results for orders placed during the hospital encounter of 01/12/14 (from the past 48 hour(s))  CBC WITH DIFFERENTIAL     Status: None   Collection Time    01/12/14  5:38 AM      Result Value Ref Range   WBC 8.2  4.0 - 10.5 K/uL   RBC 4.45  4.22 - 5.81 MIL/uL   Hemoglobin 14.9  13.0 - 17.0 g/dL   HCT 42.5  39.0 - 52.0 %   MCV 95.5  78.0 - 100.0 fL   MCH 33.5  26.0 - 34.0 pg   MCHC 35.1  30.0 - 36.0 g/dL   RDW 14.1  11.5 - 15.5 %   Platelets 228  150 - 400 K/uL   Neutrophils Relative % 69  43 - 77 %   Neutro Abs 5.7  1.7 - 7.7 K/uL   Lymphocytes Relative 21  12 - 46 %   Lymphs Abs 1.8  0.7 - 4.0 K/uL   Monocytes Relative 7  3 - 12 %     Monocytes Absolute 0.5  0.1 - 1.0 K/uL   Eosinophils Relative 3  0 - 5 %   Eosinophils Absolute 0.3  0.0 - 0.7 K/uL   Basophils Relative 0  0 - 1 %   Basophils Absolute 0.0  0.0 - 0.1 K/uL  BASIC METABOLIC PANEL     Status: Abnormal   Collection Time    01/12/14  5:38 AM      Result Value Ref Range   Sodium 142  137 - 147  mEq/L   Potassium 3.4 (*) 3.7 - 5.3 mEq/L   Chloride 102  96 - 112 mEq/L   CO2 23  19 - 32 mEq/L   Glucose, Bld 121 (*) 70 - 99 mg/dL   BUN 21  6 - 23 mg/dL   Creatinine, Ser 1.21  0.50 - 1.35 mg/dL   Calcium 8.9  8.4 - 10.5 mg/dL   GFR calc non Af Amer 54 (*) >90 mL/min   GFR calc Af Amer 63 (*) >90 mL/min   Comment: (NOTE)     The eGFR has been calculated using the CKD EPI equation.     This calculation has not been validated in all clinical situations.     eGFR's persistently <90 mL/min signify possible Chronic Kidney     Disease.   Anion gap 17 (*) 5 - 15  TROPONIN I     Status: None   Collection Time    01/12/14  5:38 AM      Result Value Ref Range   Troponin I <0.30  <0.30 ng/mL   Comment:            Due to the release kinetics of cTnI,     a negative result within the first hours     of the onset of symptoms does not rule out     myocardial infarction with certainty.     If myocardial infarction is still suspected,     repeat the test at appropriate intervals.  PROTIME-INR     Status: Abnormal   Collection Time    01/12/14  5:38 AM      Result Value Ref Range   Prothrombin Time 27.9 (*) 11.6 - 15.2 seconds   INR 2.58 (*) 0.00 - 1.49  PRO B NATRIURETIC PEPTIDE     Status: Abnormal   Collection Time    01/12/14  5:39 AM      Result Value Ref Range   Pro B Natriuretic peptide (BNP) 4853.0 (*) 0 - 450 pg/mL  TROPONIN I     Status: Abnormal   Collection Time    01/12/14  8:45 AM      Result Value Ref Range   Troponin I 0.30 (*) <0.30 ng/mL   Comment:            Due to the release kinetics of cTnI,     a negative result within the first  hours     of the onset of symptoms does not rule out     myocardial infarction with certainty.     If myocardial infarction is still suspected,     repeat the test at appropriate intervals.     CRITICAL RESULT CALLED TO, READ BACK BY AND VERIFIED WITH:     M.CROSSON,RN 1015 01/12/14 M.CAMPBELL  TROPONIN I     Status: Abnormal   Collection Time    01/12/14  1:43 PM      Result Value Ref Range   Troponin I 0.54 (*) <0.30 ng/mL   Comment:            Due to the release kinetics of cTnI,     a negative result within the first hours     of the onset of symptoms does not rule out     myocardial infarction with certainty.     If myocardial infarction is still suspected,     repeat the test at appropriate intervals.     CRITICAL VALUE NOTED.  VALUE IS CONSISTENT WITH  PREVIOUSLY REPORTED AND CALLED VALUE.    Dg Chest Portable 1 View  01/12/2014   CLINICAL DATA:  Shortness of breath. Atrial fibrillation. Initial encounter  EXAM: PORTABLE CHEST - 1 VIEW  COMPARISON:  None.  FINDINGS: Diffuse interstitial and airspace opacity which is symmetric. Small pleural effusions are present. Background pulmonary hyperinflation. There is cardiomegaly. Previous median sternotomy for aortic valve replacement.  IMPRESSION: 1. CHF. 2. Probable background COPD. 3. Aortic valve replacement.   Electronically Signed   By: Jorje Guild M.D.   On: 01/12/2014 05:58    Review of Systems  Respiratory: Positive for shortness of breath.   Cardiovascular: Positive for chest pain and orthopnea. Negative for leg swelling and PND.  All other systems reviewed and are negative.  Blood pressure 113/43, pulse 67, temperature 97.9 F (36.6 C), temperature source Oral, resp. rate 18, height _0  (1.803 m), weight 180 lb (81.647 kg), SpO2 98.00%. Physical Exam  Constitutional: He is oriented to person, place, and time. He appears well-developed and well-nourished.  Neck: Carotid bruit is not present.  Cardiovascular: Normal  rate, regular rhythm and intact distal pulses.  Exam reveals no gallop and no friction rub.   Murmur heard. Respiratory: Effort normal. No respiratory distress. He has no wheezes. He has rales (bibasilar L>R).  Neurological: He is alert and oriented to person, place, and time.  Skin: Skin is warm and dry. He is not diaphoretic.  Psychiatric: He has a normal mood and affect. His behavior is normal.    Assessment/Plan: Active Problems:   CHF exacerbation   Acute combined systolic and diastolic heart failure   Elevated troponin  1. Combined systolic + diastolic heart failure: Last known EF was 40-45% in December 2014. Repeat echo has been performed. Results pending. BNP on arrival was elevated at 4853. Chest x-ray shows cardiomegaly and bilateral pleural effusions. Breathing has improved with IV Lasix and supplemental O2. Physical exam is still notable for bilateral basilar rales, L>R. Recommend continuation of twice a day IV Lasix. Monitor renal function, electrolytes and blood pressure closely. Recommend low sodium diet, strict I/Os and daily weights.  2. Elevated troponin's: Enzymes are mildly positive x2 and 0.30 and 0.54. He denies any chest pain currently but noted left sided chest pressure during acute onset of dyspnea this am. No history of anginal symptoms prior to today's events. Troponins are likely secondary to demand ischemia in the setting of atrial fibrillation with RVR and acute CHF. If 2-D echo reveals significant change in systolic dysfunction, would recommend cardiac catheterization. However, if EF is unchanged could pursue ischemic evaluation with a noninvasive nuclear stress test.  3. Atrial fibrillation with RVR: Per ED records, patient spontaneously converted to normal sinus rhythm after administration of IV Cardizem. Telemetry currently shows normal sinus rhythm. Rate is controlled in the 60s. Continue rate control therapy with metoprolol. He is on warfarin for stroke  prophylaxis. INR today is 2.58. ? holding warfarin and starting IV heparin in case left heart catheterization is needed. Will defer to M.D. Recommend checking a TSH.  4. History of tissue aortic valve replacement: Performed in 2006 in [place]. M.D. to review 2-D echo images to assess status of valve.   5. Hypertension: Well controlled. Monitor blood pressure closely in the setting of IV Lasix therapy.  6. Hyperlipidemia: Last lipid panel was Dec. 2014. LDL was controlled at that time and 81 mg/dL. Continue Lipitor. Recommend fasting lipid panel in a.m. to make sure that this is still well controlled.  7. Hypokalemia: Potassium was 3.4 on admit. He received supplemental potassium this a.m. Monitor electrolytes closely with daily BMETs and replete as needed.   Lyda Jester 01/12/2014, 5:45 PM   Patient seen with Lyda Jester, PA-C. Agree with her findings above as modified. I am concerned about his worsening LV function with EF now 30-35 % with akinesis of the basal and mid interolateral walls. He was not previously known to have significant CAD.  Did not require CABG at the time of his aortic valve replacement. He also now has moderate to severe MR by echo. Some of this MR may be secondary to LV dilatation with treatment of his combined systolic and diastolic heart failure.  He has been on ACEi but not beta blocker at home. He has mild elevation of troponins. His EKG shows LBBB and is therefore not helpful in assessing for new ischemia. I would favor proceeding with right and left heart catheterization after his acute CHF has improved with diuresis. Will hold warfarin and bridge with heparin.

## 2014-01-12 NOTE — Progress Notes (Signed)
Echocardiogram 2D Echocardiogram has been performed.  Timothy Durham 01/12/2014, 3:15 PM

## 2014-01-12 NOTE — Progress Notes (Signed)
[facility] hospitalist was notifed that pt was very restless and anxious getting out of bed and experiencing shortness of breath pt urinal was place at bed side and pt was place in bed and bed alarm placed. Pt got out of bed to chair and again SOB. Order was 0.5 mg of Ativan which pt refused even though he states he is anxious and explained this would help him relax pt in the chair and refused to get in the bed. Chair alarm was place on chair pt was instructed not to get out of bed with out assistance. Ilean Skill LPN

## 2014-01-12 NOTE — ED Notes (Signed)
Verbal order given by Dr. Preston Fleeting to D/C cardizem drip.

## 2014-01-12 NOTE — ED Provider Notes (Addendum)
CSN: [id number]     Arrival date & time 01/12/14  0509 History   First MD Initiated Contact with Patient 01/12/14 [id number]     Chief Complaint  Patient presents with  . Shortness of Breath  . Atrial Fibrillation     (Consider location/radiation/quality/duration/timing/severity/associated sxs/prior Treatment) The history is provided by the patient.   80 year old male comes in with about a three-day history of worsening dyspnea. Nothing makes it better nothing makes it worse. There's been a cough productive of a small amount of clear sputum. He denies fever, chills, sweats. He denies chest pain, and heaviness, tightness but has had some mild chest pressure in the left anterior chest wall. There's been no nausea or vomiting. He was brought in by EMS who gave him albuterol with no improvement. He is anticoagulated because of a history of a stroke and atrial fibrillation.  Past Medical History  Diagnosis Date  . Arthritis   . Coronary artery disease   . Hypertension   . Stroke     July 2000  January 2007  . Renal disorder     non acute  . GERD (gastroesophageal reflux disease)   . Foot pain, bilateral   . H/O hematuria   . Hyperlipidemia   . Vitamin D deficiency    Past Surgical History  Procedure Laterality Date  . Aortic valve replacement (avr)/coronary artery bypass grafting (cabg)  12/2008  . Eye surgery    . Carotid angiogram  1996   Family History  Problem Relation Age of Onset  . Stroke Mother   . Cancer Father    History  Substance Use Topics  . Smoking status: Former Smoker    Quit date: 09/26/1998  . Smokeless tobacco: Not on file  . Alcohol Use: 0.6 oz/week    1 Cans of beer per week     Comment: drinks one can of beer daily.    Review of Systems  All other systems reviewed and are negative.     Allergies  Penicillins and Sulfa antibiotics  Home Medications   Prior to Admission medications   Medication Sig Start Date End Date Taking? Authorizing  Provider  acetaminophen (TYLENOL) 500 MG tablet Take 1,000 mg by mouth every evening.    Historical Provider, MD  alendronate (FOSAMAX) 70 MG tablet Take 70 mg by mouth once a week. sunday 02/28/13   Historical Provider, MD  amLODipine (NORVASC) 2.5 MG tablet Take 2.5 mg by mouth every evening.  03/12/13   Historical Provider, MD  aspirin EC 81 MG tablet Take 81 mg by mouth every morning.    Historical Provider, MD  atorvastatin (LIPITOR) 40 MG tablet Take 40 mg by mouth every evening.    Historical Provider, MD  Calcium Carb-Cholecalciferol (CALCIUM 600 + D PO) Take 2 tablets by mouth every morning.    Historical Provider, MD  cholecalciferol (VITAMIN D) 1000 UNITS tablet Take 1,000 Units by mouth every morning.    Historical Provider, MD  enoxaparin (LOVENOX) 40 MG/0.4ML injection Inject 0.8 mLs (80 mg total) into the skin every 12 (twelve) hours. 03/27/13   Jerald Kief, MD  furosemide (LASIX) 40 MG tablet Take 40 mg by mouth daily. 02/28/13   Historical Provider, MD  hydrochlorothiazide (HYDRODIURIL) 25 MG tablet Take 25 mg by mouth every morning.    Historical Provider, MD  Omega-3 Fatty Acids (FISH OIL) 1200 MG CAPS Take 1 capsule by mouth every morning.    Historical Provider, MD  omeprazole (PRILOSEC) 40 MG capsule  Take 40 mg by mouth every morning.    Historical Provider, MD  quinapril (ACCUPRIL) 40 MG tablet Take 20-40 mg by mouth 2 (two) times daily. 40 mg a.m. And 20mg  p.m.    Historical Provider, MD  warfarin (COUMADIN) 4 MG tablet Take 2-4 mg by mouth every morning. 4 mg every morning except on mondays pt takes 2mg  02/24/13   Historical Provider, MD   Pulse 124  Temp(Src) 98.1 F (36.7 C) (Oral)  Resp 20  Ht 5\' 11"  (1.803 m)  Wt 180 lb (81.647 kg)  BMI 25.12 kg/m2  SpO2 95% Physical Exam  Nursing note and vitals reviewed.  80 year old male, who is moderately dyspneic at rest, but is in no acute distress. Vital signs are significant for tachycardia. Oxygen saturation is 95%,  which is normal, but this is with supplemental oxygen. Head is normocephalic and atraumatic. PERRLA, EOMI. Oropharynx is clear. Neck is nontender and supple without adenopathy or JVD. Back is nontender and there is no CVA tenderness. Lungs have bibasilar rales and coarse expiratory rhonchi. Chest is nontender. Heart is tachycardic and irregular without murmur. Abdomen is soft, flat, nontender without masses or hepatosplenomegaly and peristalsis is normoactive. Extremities have n2+ edema, full range of motion is present. Skin is warm and dry without rash. Neurologic: Mental status is normal, cranial nerves are intact, there are no motor or sensory deficits.  ED Course  Procedures (including critical care time) Labs Review Results for orders placed during the hospital encounter of 01/12/14  CBC WITH DIFFERENTIAL      Result Value Ref Range   WBC 8.2  4.0 - 10.5 K/uL   RBC 4.45  4.22 - 5.81 MIL/uL   Hemoglobin 14.9  13.0 - 17.0 g/dL   HCT 61.6  83.7 - 29.0 %   MCV 95.5  78.0 - 100.0 fL   MCH 33.5  26.0 - 34.0 pg   MCHC 35.1  30.0 - 36.0 g/dL   RDW 21.1  15.5 - 20.8 %   Platelets 228  150 - 400 K/uL   Neutrophils Relative % 69  43 - 77 %   Neutro Abs 5.7  1.7 - 7.7 K/uL   Lymphocytes Relative 21  12 - 46 %   Lymphs Abs 1.8  0.7 - 4.0 K/uL   Monocytes Relative 7  3 - 12 %   Monocytes Absolute 0.5  0.1 - 1.0 K/uL   Eosinophils Relative 3  0 - 5 %   Eosinophils Absolute 0.3  0.0 - 0.7 K/uL   Basophils Relative 0  0 - 1 %   Basophils Absolute 0.0  0.0 - 0.1 K/uL  BASIC METABOLIC PANEL      Result Value Ref Range   Sodium 142  137 - 147 mEq/L   Potassium 3.4 (*) 3.7 - 5.3 mEq/L   Chloride 102  96 - 112 mEq/L   CO2 23  19 - 32 mEq/L   Glucose, Bld 121 (*) 70 - 99 mg/dL   BUN 21  6 - 23 mg/dL   Creatinine, Ser 0.22  0.50 - 1.35 mg/dL   Calcium 8.9  8.4 - 33.6 mg/dL   GFR calc non Af Amer 54 (*) >90 mL/min   GFR calc Af Amer 63 (*) >90 mL/min   Anion gap 17 (*) 5 - 15  PRO B  NATRIURETIC PEPTIDE      Result Value Ref Range   Pro B Natriuretic peptide (BNP) 4853.0 (*) 0 - 450 pg/mL  TROPONIN I      Result Value Ref Range   Troponin I <0.30  <0.30 ng/mL  PROTIME-INR      Result Value Ref Range   Prothrombin Time 27.9 (*) 11.6 - 15.2 seconds   INR 2.58 (*) 0.00 - 1.49   Imaging Review Dg Chest Portable 1 View  01/12/2014   CLINICAL DATA:  Shortness of breath. Atrial fibrillation. Initial encounter  EXAM: PORTABLE CHEST - 1 VIEW  COMPARISON:  None.  FINDINGS: Diffuse interstitial and airspace opacity which is symmetric. Small pleural effusions are present. Background pulmonary hyperinflation. There is cardiomegaly. Previous median sternotomy for aortic valve replacement.  IMPRESSION: 1. CHF. 2. Probable background COPD. 3. Aortic valve replacement.   Electronically Signed   By: Tiburcio PeaJonathan  Watts M.D.   On: 01/12/2014 05:58   Images viewed by me.   EKG Interpretation   Date/Time:  Sunday January 12 2014 05:17:10 EDT Ventricular Rate:  138 PR Interval:    QRS Duration: 136 QT Interval:  343 QTC Calculation: 520 R Axis:   -7 Text Interpretation:  Atrial fibrillation Paired ventricular premature  complexes Left bundle branch block When compared with ECG of 03/25/2013,  HEART RATE has increased Premature ventricular complexes are now Present  Confirmed by [facility]  MD, Kristion Holifield ([id number]) on 01/12/2014 5:24:39 AM       EKG Interpretation   Date/Time:  Sunday January 12 2014 05:46:31 EDT Ventricular Rate:  73 PR Interval:  208 QRS Duration: 130 QT Interval:  406 QTC Calculation: 447 R Axis:   15 Text Interpretation:  Sinus rhythm Multiple premature complexes, vent   Probable left atrial enlargement Left bundle branch block When compared  with ECG of 01/12/2014, Sinus rhythm has replaced Atrial fibrillation with  rapid ventricular response Confirmed by [facility]  MD, Anterio Scheel ([id number]) on  01/12/2014 5:58:19 AM      CRITICAL CARE Performed by: XBJYN,WGNFAGLICK,Tiffany Talarico Total  critical care time: 40 minutes Critical care time was exclusive of separately billable procedures and treating other patients. Critical care was necessary to treat or prevent imminent or life-threatening deterioration. Critical care was time spent personally by me on the following activities: development of treatment plan with patient and/or surrogate as well as nursing, discussions with consultants, evaluation of patient's response to treatment, examination of patient, obtaining history from patient or surrogate, ordering and performing treatments and interventions, ordering and review of laboratory studies, ordering and review of radiographic studies, pulse oximetry and re-evaluation of patient's condition.  MDM   Final diagnoses:  Acute exacerbation of CHF (congestive heart failure)  Paroxysmal atrial fibrillation    CHF exacerbation which is most likely from uncontrolled atrial fibrillation. Will be started on diltiazem for rate control, and he will be given a dose of furosemide.  Chest x-ray confirms congestive heart failure. After he was given diltiazem, the rhythm spontaneously converted to sinus rhythm and diltiazem was discontinued. He is feeling significantly better at this point. However, he continues to get hypoxic taken off of oxygen. Case is discussed with Dr. Izola PriceMyers of [facility] hospitalist who agrees to admit the patient.  Dione Boozeavid Day Deery, MD 01/12/14 [phone]  Dione Boozeavid Nataliyah Packham, MD 01/12/14 [phone]

## 2014-01-12 NOTE — Progress Notes (Addendum)
ANTICOAGULATION CONSULT NOTE - Initial Consult  Pharmacy Consult for warfarin Indication: atrial fibrillation  Allergies  Allergen Reactions  . Penicillins     Unknown childhood reaction   . Sulfa Antibiotics     unknown    Patient Measurements: Height: 5\' 11"  (180.3 cm) Weight: 180 lb (81.647 kg) IBW/kg (Calculated) : 75.3 Heparin Dosing Weight:  Vital Signs: Temp: 97.7 F (36.5 C) (10/18 0820) Temp Source: Oral (10/18 0820) BP: 125/56 mmHg (10/18 0820) Pulse Rate: 89 (10/18 0820)  Labs:  Recent Labs  01/12/14 0538  HGB 14.9  HCT 42.5  PLT 228  LABPROT 27.9*  INR 2.58*  CREATININE 1.21  TROPONINI <0.30    Estimated Creatinine Clearance: 51 ml/min (by C-G formula based on Cr of 1.21).   Medical History: Past Medical History  Diagnosis Date  . Arthritis   . Coronary artery disease   . Hypertension   . Stroke     July 2000  January 2007  . Renal disorder     non acute  . GERD (gastroesophageal reflux disease)   . Foot pain, bilateral   . H/O hematuria   . Hyperlipidemia   . Vitamin D deficiency     Medications:  Prescriptions prior to admission  Medication Sig Dispense Refill  . acetaminophen (TYLENOL) 500 MG tablet Take 1,000 mg by mouth every 6 (six) hours as needed for mild pain or headache.       . alendronate (FOSAMAX) 70 MG tablet Take 70 mg by mouth once a week. sunday      . allopurinol (ZYLOPRIM) 100 MG tablet Take 200 mg by mouth daily.      Marland Kitchen amLODipine (NORVASC) 2.5 MG tablet Take 2.5 mg by mouth every evening.       Marland Kitchen atorvastatin (LIPITOR) 40 MG tablet Take 40 mg by mouth every evening.      . Calcium Carb-Cholecalciferol (CALCIUM 600 + D PO) Take 2 tablets by mouth every morning.      . cholecalciferol (VITAMIN D) 1000 UNITS tablet Take 1,000 Units by mouth every morning.      . clopidogrel (PLAVIX) 75 MG tablet Take 75 mg by mouth daily.      . furosemide (LASIX) 40 MG tablet Take 40 mg by mouth daily.      . Omega-3 Fatty Acids  (FISH OIL) 1200 MG CAPS Take 1 capsule by mouth every morning.      Marland Kitchen omeprazole (PRILOSEC) 40 MG capsule Take 40 mg by mouth every morning.      . quinapril (ACCUPRIL) 40 MG tablet Take 20-40 mg by mouth 2 (two) times daily. 40 mg a.m. And 20mg  p.m.      Marland Kitchen warfarin (COUMADIN) 1 MG tablet Take 1 mg by mouth once a week. On Sunday      . warfarin (COUMADIN) 4 MG tablet Take 4 mg by mouth See admin instructions. Monday-Saturday        Assessment: 80 yo male on warfarin PTA for AFib.  PTA dose: 1 mg on Sundays 4 mg on all other days. INR on admission 2.58. Will continue home regimen.    Goal of Therapy:  INR 2-3 Monitor platelets by anticoagulation protocol: Yes    Plan:  -Cont 1 mg po q Sundays, 4 mg po all other days -Daily INR   Agapito Games, PharmD, BCPS Clinical Pharmacist Pager: [phone] 01/12/2014 9:07 AM   --------------------------------------------------------------------------------------- Addendum:  Cardiology rounded on this patient today and wants to hold warfarin for plans for  possible cath and start heparin when INR <2 for bridging. Will f/u with AM INR for possible need to start heparin. Warfarin dose was already given this evening.  Plan 1. Hold warfarin 2. Will plan to start heparin when INR <2 - to obtain INR with 10/19 AM labs  Georgina PillionElizabeth Tjay Velazquez, PharmD, BCPS Clinical Pharmacist Pager: [phone] 01/12/2014 7:00 PM

## 2014-01-13 ENCOUNTER — Encounter (HOSPITAL_COMMUNITY): Payer: Self-pay | Admitting: General Practice

## 2014-01-13 DIAGNOSIS — N183 Chronic kidney disease, stage 3 (moderate): Secondary | ICD-10-CM

## 2014-01-13 LAB — BASIC METABOLIC PANEL
ANION GAP: 15 (ref 5–15)
Anion gap: 13 (ref 5–15)
BUN: 33 mg/dL — AB (ref 6–23)
BUN: 33 mg/dL — AB (ref 6–23)
CALCIUM: 9.2 mg/dL (ref 8.4–10.5)
CHLORIDE: 102 meq/L (ref 96–112)
CO2: 25 mEq/L (ref 19–32)
CO2: 22 mEq/L (ref 19–32)
CREATININE: 1.65 mg/dL — AB (ref 0.50–1.35)
Calcium: 9.1 mg/dL (ref 8.4–10.5)
Chloride: 103 mEq/L (ref 96–112)
Creatinine, Ser: 1.79 mg/dL — ABNORMAL HIGH (ref 0.50–1.35)
GFR calc Af Amer: 43 mL/min — ABNORMAL LOW (ref >90)
GFR calc non Af Amer: 34 mL/min — ABNORMAL LOW (ref >90)
GFR calc non Af Amer: 37 mL/min — ABNORMAL LOW (ref >90)
GFR, EST AFRICAN AMERICAN: 39 mL/min — AB (ref >90)
GLUCOSE: 108 mg/dL — AB (ref 70–99)
Glucose, Bld: 109 mg/dL — ABNORMAL HIGH (ref 70–99)
POTASSIUM: 4.8 meq/L (ref 3.7–5.3)
Potassium: 5.8 mEq/L — ABNORMAL HIGH (ref 3.7–5.3)
Sodium: 141 mEq/L (ref 137–147)
Sodium: 139 mEq/L (ref 137–147)

## 2014-01-13 LAB — PROTIME-INR
INR: 2.3 — ABNORMAL HIGH (ref 0.00–1.49)
INR: 2.05 — ABNORMAL HIGH (ref 0.00–1.49)
PROTHROMBIN TIME: 23.3 s — AB (ref 11.6–15.2)
Prothrombin Time: 25.5 seconds — ABNORMAL HIGH (ref 11.6–15.2)

## 2014-01-13 LAB — POTASSIUM: Potassium: 5.1 mEq/L (ref 3.7–5.3)

## 2014-01-13 LAB — TROPONIN I: Troponin I: 0.47 ng/mL (ref <0.30)

## 2014-01-13 MED ORDER — BISMUTH SUBSALICYLATE 262 MG/15ML PO SUSP
30.0000 mL | Freq: Three times a day (TID) | ORAL | Status: DC
  Administered 2014-01-13 – 2014-01-15 (×4): 30 mL via ORAL
  Filled 2014-01-13: qty 236

## 2014-01-13 NOTE — Progress Notes (Signed)
Pt stated that he did not want to try wearing his CPAP tonight.

## 2014-01-13 NOTE — Progress Notes (Signed)
BMET repeated, K 4.8, Cr 1.65. Cr still elevated from 1.2, however slightly lower than 1.79 from this morning. Will hold lasix for now. Daily PT/INR. Per Dr. Antoine Poche, h/o severe LE PVD, will plan for L and RHC via R radial and antecubital access. Follow up on renal function.   Ramond Dial PA Pager: [phone]

## 2014-01-13 NOTE — Progress Notes (Signed)
Patient Name: Timothy Durham Date of Encounter: 01/13/2014  Primary Cardiologist: New (seen at [facility] in the past) - seen by Dr. Patty Sermons for consult during this admission    Active Problems:   CHF exacerbation   Acute combined systolic and diastolic heart failure   Elevated troponin    SUBJECTIVE  Denies any CP, however continue to have SOB at rest which is abnormal for him. Usually only have SOB with exertion, however has been noting increasing dyspnea at rest. Has been taking coumadin since 2000 for stroke. Believe his last cath was in [place] in 2010 prior to AVR   CURRENT MEDS . allopurinol  200 mg Oral Daily  . atorvastatin  40 mg Oral QPM  . cholecalciferol  1,000 Units Oral q morning - 10a  . clopidogrel  75 mg Oral Daily  . furosemide  40 mg Intravenous Q12H  . LORazepam  0.5 mg Oral Once  . metoprolol tartrate  25 mg Oral BID  . pantoprazole  40 mg Oral Daily  . potassium chloride  30 mEq Oral BID  . quinapril  20 mg Oral BID  . warfarin  1 mg Oral Q Sun-1800  . warfarin  4 mg Oral Once per day on Mon Tue Wed Thu Fri Sat  . Warfarin - Pharmacist Dosing Inpatient   Does not apply q1800    OBJECTIVE  Filed Vitals:   01/12/14 1342 01/12/14 1620 01/12/14 2022 01/13/14 0404  BP: 93/43 113/43 117/47 123/35  Pulse: 62 67 71 59  Temp: 97.9 F (36.6 C)  97.8 F (36.6 C) 97.5 F (36.4 C)  TempSrc: Oral  Oral Oral  Resp: 18  17 16   Height:      Weight:    175 lb 12.8 oz (79.742 kg)  SpO2: 96% 98% 98%     Intake/Output Summary (Last 24 hours) at 01/13/14 0835 Last data filed at 01/13/14 0600  Gross per 24 hour  Intake    480 ml  Output    850 ml  Net   -370 ml   Filed Weights   01/12/14 0516 01/13/14 0404  Weight: 180 lb (81.647 kg) 175 lb 12.8 oz (79.742 kg)    PHYSICAL EXAM  General: Pleasant, NAD. Neuro: Alert and oriented X 3. Moves all extremities spontaneously. Psych: Normal affect. HEENT:  Normal  Neck: Supple without bruits. Lungs:   Resp regular and unlabored, mild bibasilar rale, worse on R base Heart: RRR no s3, s4, or murmurs. Abdomen: Soft, non-tender, non-distended, BS + x 4.  Extremities: No clubbing, cyanosis. DP/PT/Radials 2+ and equal bilaterally. 0-1+ edema in bilateral LE  Accessory Clinical Findings  CBC  Recent Labs  01/12/14 0538  WBC 8.2  NEUTROABS 5.7  HGB 14.9  HCT 42.5  MCV 95.5  PLT 228   Basic Metabolic Panel  Recent Labs  01/12/14 0538 01/13/14 0412  NA 142 141  K 3.4* 5.8*  CL 102 103  CO2 23 25  GLUCOSE 121* 108*  BUN 21 33*  CREATININE 1.21 1.79*  CALCIUM 8.9 9.2   Cardiac Enzymes  Recent Labs  01/12/14 1343 01/12/14 2008 01/13/14 0412  TROPONINI 0.54* 0.54* 0.47*    TELE NSR with HR 50-90s, no significant ventricular ectopy    ECG  NSR with HR 70s, PVCs  Echocardiogram 01/12/2014  LV EF: 30% - 35%  ------------------------------------------------------------------- Indications: CHF - 428.0.  ------------------------------------------------------------------- History: PMH: Atrial fibrillation. Coronary artery disease. Transient ischemic attack. Risk factors: Hypertension.  ------------------------------------------------------------------- Study  Conclusions  - Left ventricle: There is global hypokinesis with akinesis of the basal and mid inferolateral walls. The cavity size was moderately dilated. There was moderate concentric hypertrophy. Systolic function was moderately to severely reduced. The estimated ejection fraction was in the range of 30% to 35%. Doppler parameters are consistent with a restrictive pattern, indicative of decreased left ventricular diastolic compliance and/or increased left atrial pressure (grade 3 diastolic dysfunction). Doppler parameters are consistent with elevated ventricular end-diastolic filling pressure. - Aortic valve: Bioprosthetic valve seems to be functioning normally. Normal transaortic gradients. There is  mild central regurgitation and no paravalvular leak. There was mild regurgitation directed eccentrically in the LVOT. Valve area (Vmax): 0.96 cm^2. - Aorta: The aorta was normal, not dilated, and non-diseased. Aortic root dimension: 40 mm (ED). - Mitral valve: There was moderate to severe regurgitation. - Left atrium: The atrium was moderately dilated. - Right ventricle: Systolic function was moderately reduced. - Atrial septum: No defect or patent foramen ovale was identified. - Tricuspid valve: There was mild regurgitation.  Impressions:  - When compared to the prior study from 03/26/2013 the left ventricle is now moderately dilated with moderate to severe systolic dysfunction and estimated LVEF 30-35%, previously 40-45%. There is restrictive pattern of diastolic dysfunction with elevated filling pressures. Mitral regurgitation is moderate to severe, previosuly mild. Right ventricular systolic function is moderately reduced     Radiology/Studies  Dg Chest Portable 1 View  01/12/2014   CLINICAL DATA:  Shortness of breath. Atrial fibrillation. Initial encounter  EXAM: PORTABLE CHEST - 1 VIEW  COMPARISON:  None.  FINDINGS: Diffuse interstitial and airspace opacity which is symmetric. Small pleural effusions are present. Background pulmonary hyperinflation. There is cardiomegaly. Previous median sternotomy for aortic valve replacement.  IMPRESSION: 1. CHF. 2. Probable background COPD. 3. Aortic valve replacement.   Electronically Signed   By: Tiburcio Pea M.D.   On: 01/12/2014 05:58    ASSESSMENT AND PLAN  1. Acute on chronic combined systolic and diastolic HF  - Last known EF was 40-45% in December 2014  - echo 01/12/2014 EF 30-35%, global hypokinesis with akinesis of basal and mid inferolateral wall, moderate LVH, grade 3 diastolic dysfunc, moderately reduced RV EF, moderate to severe MR, moderately dilated biatrial size.  - weight 180 --> 175 lbs ?accuracy, did not have  significant output despite Cr increased from 1.2 to 1.79 this am. K also increased from 3.4 to 5.8 without obvious hemolysis. ?lab, will continue lasix for now and repeat BMET. If lab accurate, then we may be looking at biventricular failure. May have to hold quinapril if Cr continue to worsen  2. Elevated troponin  - ?demand ischemia in the setting of a-fib with RVR and acute CHF vs coronary disease related  - however given worsening LV dysfunction, plan for L&RHC once HF symptom stable per Dr. Patty Sermons  - will hold coumadin today, start heparin, INR 2.3, plan for left and R heart cath.  3. A-fib with RVR  - spontaneously converted to NSR in ED after initiating IV diltiazem  4. H/o tissue aortic valve replacement 5. HTN 6. Hyperlipidemia 7. Hypokalemia: see above K went from 3.4 to 5.8  Signed, Amedeo Plenty Pager: [phone]  History and all data above reviewed.  Patient examined.  I agree with the findings as above.  The patient exam reveals  He is doing OK.  He denies any acute SOB or pain.  I have reviewed outside records for OK.  His creat is up  today.  COR:RRR  ,  Lungs: Few basilar crackles  ,  Abd: Positive bowel sounds, no rebound no guarding, Ext No edema  .  All available labs, radiology testing, previous records reviewed. Agree with documented assessment and plan.  Outside records reviewed.  All data as above reviewed.  He should, optimally have a right and left heart cath.  The left cath would need to be done from his radial since he has severe lower extremity PVD.  In anticipation of his cath I will hold his ACE inhibitor.  I will also stop his diuretic.  We are repeating a BMET today.  We will need a BMET in the AM and we can decide on the timing of cath.  He has baseline CKD. Hold the warfarin.  However, given his recent atrial fib and history of TIAs he will need bridging heparin.   Fayrene FearingJames Nyu W[facility] Hospital[facility]rein  10:43 AM  01/13/2014

## 2014-01-13 NOTE — Progress Notes (Signed)
Utilization Review Completed.Julie Paolini T10/19/2015  

## 2014-01-13 NOTE — Progress Notes (Signed)
[facility] PROGRESS NOTE  Timothy Durham Mee BJY:[id number] DOB: 1930/08/05 DOA: 01/12/2014 PCP: Dennis BastABEZA,YURI, MD Interim summary: Timothy Durham Fiorella is a 80 y.o. male with PMH of HTN, CKD, PAD s/p stent (plavix), CVA with residual L sided weakness, s/p AVR, A fib (coumadin), CHF (not on diuretics x 6 month) presented progressive SOB, DOE, some PNDs for few days, associated with mild leg edema and found to have acuet CHF, A fib RVR in ED;. He was admitted to telemetry. Cardiology consulted and plan for catheterization int he near future, as soon as INR  And creatinine improves.   Assessment/Plan: 1. Acute on chronic systolic heart failure: - diuresis on hold for renal function. He doesn t appear to be in respiratory distress at this time. Echocardiogram shows decline in the LVEF.  Cardiology consulted and plan for cardiac cath in the near future as soon as the creatinine allows. Monitor electrolytes .   2. afib with RVR: Off Cardizem drip. Continue to monitor.   3. PAD: Resume home meds.    4. CVA with left sided weakness: No new deficits.   Hypertension: needs better control.   Code Status: full code.  Family Communication: discussed with wife at bedside Disposition Plan: pending further investigation.    Consultants: Cardiology  Procedures:  echocardiogram   Antibiotics:  none  HPI/Subjective: Comfortable, denies sob, chest pain , palpitations  Objective: Filed Vitals:   01/13/14 1351  BP: 118/40  Pulse: 64  Temp: 98 F (36.7 C)  Resp: 18    Intake/Output Summary (Last 24 hours) at 01/13/14 1814 Last data filed at 01/13/14 1814  Gross per 24 hour  Intake    480 ml  Output   1500 ml  Net  -1020 ml   Filed Weights   01/12/14 0516 01/13/14 0404  Weight: 81.647 kg (180 lb) 79.742 kg (175 lb 12.8 oz)    Exam:   General:  Alert afebrile comfortable  Cardiovascular: s1s2  Respiratory: ctab  Abdomen: soft NT nd bs+  Musculoskeletal: no pedal edema.    Data Reviewed: Basic Metabolic Panel:  Recent Labs Lab 01/12/14 0538 01/13/14 0412 01/13/14 1045 01/13/14 1400  NA 142 141 139  --   K 3.4* 5.8* 4.8 5.1  CL 102 103 102  --   CO2 23 25 22   --   GLUCOSE 121* 108* 109*  --   BUN 21 33* 33*  --   CREATININE 1.21 1.79* 1.65*  --   CALCIUM 8.9 9.2 9.1  --    Liver Function Tests: No results found for this basename: AST, ALT, ALKPHOS, BILITOT, PROT, ALBUMIN,  in the last 168 hours No results found for this basename: LIPASE, AMYLASE,  in the last 168 hours No results found for this basename: AMMONIA,  in the last 168 hours CBC:  Recent Labs Lab 01/12/14 0538  WBC 8.2  NEUTROABS 5.7  HGB 14.9  HCT 42.5  MCV 95.5  PLT 228   Cardiac Enzymes:  Recent Labs Lab 01/12/14 0538 01/12/14 0845 01/12/14 1343 01/12/14 2008 01/13/14 0412  TROPONINI <0.30 0.30* 0.54* 0.54* 0.47*   BNP (last 3 results)  Recent Labs  01/12/14 0539  PROBNP 4853.0*   CBG: No results found for this basename: GLUCAP,  in the last 168 hours  No results found for this or any previous visit (from the past 240 hour(s)).   Studies: Dg Chest Portable 1 View  01/12/2014   CLINICAL DATA:  Shortness of breath. Atrial fibrillation. Initial encounter  EXAM: PORTABLE  CHEST - 1 VIEW  COMPARISON:  None.  FINDINGS: Diffuse interstitial and airspace opacity which is symmetric. Small pleural effusions are present. Background pulmonary hyperinflation. There is cardiomegaly. Previous median sternotomy for aortic valve replacement.  IMPRESSION: 1. CHF. 2. Probable background COPD. 3. Aortic valve replacement.   Electronically Signed   By: Tiburcio Pea M.D.   On: 01/12/2014 05:58    Scheduled Meds: . allopurinol  200 mg Oral Daily  . atorvastatin  40 mg Oral QPM  . bismuth subsalicylate  30 mL Oral TID AC & HS  . cholecalciferol  1,000 Units Oral q morning - 10a  . clopidogrel  75 mg Oral Daily  . LORazepam  0.5 mg Oral Once  . metoprolol tartrate  25 mg  Oral BID  . pantoprazole  40 mg Oral Daily   Continuous Infusions:   Active Problems:   CHF exacerbation   Acute combined systolic and diastolic heart failure   Elevated troponin    Time spent: 35 minutes     Jaris Kohles  [facility] Pager [phone]. If 7PM-7AM, please contact night-coverage at [email or web address], password [facility] 01/13/2014, 6:14 PM  LOS: 1 day

## 2014-01-13 NOTE — Progress Notes (Addendum)
ANTICOAGULATION CONSULT NOTE - Initial Consult  Pharmacy Consult for heparin Indication: atrial fibrillation  Allergies  Allergen Reactions  . Penicillins     Unknown childhood reaction   . Sulfa Antibiotics     unknown    Patient Measurements: Height: 5\' 11"  (180.3 cm) Weight: 175 lb 12.8 oz (79.742 kg) IBW/kg (Calculated) : 75.3 Heparin Dosing Weight:  Vital Signs: Temp: 97.5 F (36.4 C) (10/19 0404) Temp Source: Oral (10/19 0404) BP: 123/35 mmHg (10/19 0404) Pulse Rate: 59 (10/19 0404)  Labs:  Recent Labs  01/12/14 0538  01/12/14 1343 01/12/14 2008 01/13/14 0412  HGB 14.9  --   --   --   --   HCT 42.5  --   --   --   --   PLT 228  --   --   --   --   LABPROT 27.9*  --   --   --  25.5*  INR 2.58*  --   --   --  2.30*  CREATININE 1.21  --   --   --  1.79*  TROPONINI <0.30  < > 0.54* 0.54* 0.47*  < > = values in this interval not displayed.  Estimated Creatinine Clearance: 34.5 ml/min (by C-G formula based on Cr of 1.79).   Medical History: Past Medical History  Diagnosis Date  . Arthritis   . Coronary artery disease   . Hypertension   . Stroke     July 2000  January 2007  . Renal disorder     non acute  . GERD (gastroesophageal reflux disease)   . Foot pain, bilateral   . H/O hematuria   . Hyperlipidemia   . Vitamin D deficiency   . CHF (congestive heart failure)   . Dysrhythmia     HISTORY OF ATRIAL FIBRILATION  . Shortness of breath    Assessment: 80 yo male on warfarin PTA for AFib, with troponin elevation, plan for cath soon, currently holding coumadin, INR 2.3 this morning, will start IV heparin when INR < 2  Coumadin PTA dose: 1 mg on Sundays 4 mg on all other days.  Goal of Therapy:  INR 2-3 Monitor platelets by anticoagulation protocol: Yes    Plan:  - Continue holding coumadin - Recheck INR this evening and start IV heparin if INR less than 2.0 - Daily INR - f/u restart coumadin after cath  Bayard Hugger, PharmD, BCPS  Clinical  Pharmacist  Pager: [phone]  01/13/2014 11:06 AM     Addendum: heparin -INR= 2.05  Plan   -No heparin needed at this time -Recheck INR in am (start heparin when INR < 2.0)  Harland German, Pharm D 01/13/2014 7:34 PM

## 2014-01-14 ENCOUNTER — Encounter (HOSPITAL_COMMUNITY)
Admission: EM | Disposition: A | Discharge status: Home / Self Care | Payer: Self-pay | Source: Home / Self Care | Attending: Internal Medicine

## 2014-01-14 DIAGNOSIS — I2510 Atherosclerotic heart disease of native coronary artery without angina pectoris: Secondary | ICD-10-CM

## 2014-01-14 HISTORY — PX: LEFT AND RIGHT HEART CATHETERIZATION WITH CORONARY ANGIOGRAM: SHX5449

## 2014-01-14 LAB — POCT I-STAT 3, VENOUS BLOOD GAS (G3P V)
Acid-base deficit: 2 mmol/L (ref 0.0–2.0)
Bicarbonate: 23.4 mEq/L (ref 20.0–24.0)
O2 Saturation: 61 %
TCO2: 25 mmol/L (ref 0–100)
pCO2, Ven: 42.9 mmHg — ABNORMAL LOW (ref 45.0–50.0)
pH, Ven: 7.345 — ABNORMAL HIGH (ref 7.250–7.300)
pO2, Ven: 34 mmHg (ref 30.0–45.0)

## 2014-01-14 LAB — POCT I-STAT 3, ART BLOOD GAS (G3+)
Acid-base deficit: 4 mmol/L — ABNORMAL HIGH (ref 0.0–2.0)
BICARBONATE: 21.8 meq/L (ref 20.0–24.0)
O2 Saturation: 90 %
PH ART: 7.351 (ref 7.350–7.450)
PO2 ART: 62 mmHg — AB (ref 80.0–100.0)
TCO2: 23 mmol/L (ref 0–100)
pCO2 arterial: 39.3 mmHg (ref 35.0–45.0)

## 2014-01-14 LAB — BASIC METABOLIC PANEL
Anion gap: 14 (ref 5–15)
BUN: 38 mg/dL — ABNORMAL HIGH (ref 6–23)
CALCIUM: 9.1 mg/dL (ref 8.4–10.5)
CO2: 26 mEq/L (ref 19–32)
Chloride: 98 mEq/L (ref 96–112)
Creatinine, Ser: 1.65 mg/dL — ABNORMAL HIGH (ref 0.50–1.35)
GFR calc non Af Amer: 37 mL/min — ABNORMAL LOW (ref >90)
GFR, EST AFRICAN AMERICAN: 43 mL/min — AB (ref >90)
Glucose, Bld: 104 mg/dL — ABNORMAL HIGH (ref 70–99)
POTASSIUM: 4.8 meq/L (ref 3.7–5.3)
Sodium: 138 mEq/L (ref 137–147)

## 2014-01-14 LAB — PROTIME-INR
INR: 1.9 — AB (ref 0.00–1.49)
PROTHROMBIN TIME: 22 s — AB (ref 11.6–15.2)

## 2014-01-14 SURGERY — LEFT AND RIGHT HEART CATHETERIZATION WITH CORONARY ANGIOGRAM
Anesthesia: LOCAL

## 2014-01-14 MED ORDER — HEPARIN (PORCINE) IN NACL 100-0.45 UNIT/ML-% IJ SOLN
1050.0000 [IU]/h | INTRAMUSCULAR | Status: DC
  Administered 2014-01-14: 1050 [IU]/h via INTRAVENOUS
  Filled 2014-01-14: qty 250

## 2014-01-14 MED ORDER — NITROGLYCERIN 1 MG/10 ML FOR IR/CATH LAB
INTRA_ARTERIAL | Status: AC
  Filled 2014-01-14: qty 10

## 2014-01-14 MED ORDER — WARFARIN SODIUM 4 MG PO TABS
4.0000 mg | ORAL_TABLET | Freq: Once | ORAL | Status: AC
  Administered 2014-01-14: 4 mg via ORAL
  Filled 2014-01-14: qty 1

## 2014-01-14 MED ORDER — SODIUM CHLORIDE 0.9 % IJ SOLN: 3.0000 mL | Freq: Two times a day (BID) | INTRAMUSCULAR | Status: DC

## 2014-01-14 MED ORDER — LIDOCAINE HCL (PF) 1 % IJ SOLN
INTRAMUSCULAR | Status: AC
  Filled 2014-01-14: qty 30

## 2014-01-14 MED ORDER — SODIUM CHLORIDE 0.9 % IJ SOLN
3.0000 mL | INTRAMUSCULAR | Status: DC | PRN
Start: 2014-01-14 — End: 2014-01-14

## 2014-01-14 MED ORDER — SODIUM CHLORIDE 0.9 % IV SOLN
1.0000 mL/kg/h | INTRAVENOUS | Status: AC
  Administered 2014-01-14: 1 mL/kg/h via INTRAVENOUS

## 2014-01-14 MED ORDER — VERAPAMIL HCL 2.5 MG/ML IV SOLN
INTRAVENOUS | Status: AC
  Filled 2014-01-14: qty 2

## 2014-01-14 MED ORDER — HEPARIN (PORCINE) IN NACL 2-0.9 UNIT/ML-% IJ SOLN
INTRAMUSCULAR | Status: AC
  Filled 2014-01-14: qty 1500

## 2014-01-14 MED ORDER — SODIUM CHLORIDE 0.9 % IV SOLN
250.0000 mL | INTRAVENOUS | Status: DC | PRN
Start: 2014-01-14 — End: 2014-01-14

## 2014-01-14 MED ORDER — FUROSEMIDE 10 MG/ML IJ SOLN
40.0000 mg | Freq: Once | INTRAMUSCULAR | Status: DC
  Filled 2014-01-14: qty 4

## 2014-01-14 MED ORDER — FUROSEMIDE 10 MG/ML IJ SOLN
40.0000 mg | Freq: Once | INTRAMUSCULAR | Status: AC
Start: 2014-01-14 — End: 2014-01-14
  Administered 2014-01-14: 40 mg via INTRAVENOUS

## 2014-01-14 MED ORDER — ALPRAZOLAM 0.5 MG PO TABS
0.5000 mg | ORAL_TABLET | Freq: Once | ORAL | Status: AC
  Administered 2014-01-14: 0.5 mg via ORAL
  Filled 2014-01-14: qty 1

## 2014-01-14 MED ORDER — ASPIRIN 81 MG PO CHEW: 81.0000 mg | CHEWABLE_TABLET | ORAL | Status: DC

## 2014-01-14 MED ORDER — HEPARIN (PORCINE) IN NACL 100-0.45 UNIT/ML-% IJ SOLN
1050.0000 [IU]/h | INTRAMUSCULAR | Status: DC
  Administered 2014-01-14 – 2014-01-15 (×2): 1050 [IU]/h via INTRAVENOUS
  Filled 2014-01-14 (×2): qty 250

## 2014-01-14 MED ORDER — SODIUM CHLORIDE 0.9 % IV SOLN
Freq: Once | INTRAVENOUS | Status: AC
  Administered 2014-01-14: 09:00:00 via INTRAVENOUS

## 2014-01-14 MED ORDER — WARFARIN - PHARMACIST DOSING INPATIENT
Freq: Every day | Status: DC
Start: 2014-01-14 — End: 2014-01-16
  Administered 2014-01-14: 18:00:00

## 2014-01-14 MED ORDER — MIDAZOLAM HCL 2 MG/2ML IJ SOLN
INTRAMUSCULAR | Status: AC
  Filled 2014-01-14: qty 2

## 2014-01-14 MED ORDER — FENTANYL CITRATE 0.05 MG/ML IJ SOLN
INTRAMUSCULAR | Status: AC
  Filled 2014-01-14: qty 2

## 2014-01-14 NOTE — H&P (View-Only) (Signed)
 Patient Name: Timothy Durham Date of Encounter: 01/13/2014  Primary Cardiologist: New (seen at [place] in the past) - seen by Dr. Brackbill for consult during this admission    Active Problems:   CHF exacerbation   Acute combined systolic and diastolic heart failure   Elevated troponin    SUBJECTIVE  Denies any CP, however continue to have SOB at rest which is abnormal for him. Usually only have SOB with exertion, however has been noting increasing dyspnea at rest. Has been taking coumadin since 2000 for stroke. Believe his last cath was in [place] in 2010 prior to AVR   CURRENT MEDS . allopurinol  200 mg Oral Daily  . atorvastatin  40 mg Oral QPM  . cholecalciferol  1,000 Units Oral q morning - 10a  . clopidogrel  75 mg Oral Daily  . furosemide  40 mg Intravenous Q12H  . LORazepam  0.5 mg Oral Once  . metoprolol tartrate  25 mg Oral BID  . pantoprazole  40 mg Oral Daily  . potassium chloride  30 mEq Oral BID  . quinapril  20 mg Oral BID  . warfarin  1 mg Oral Q Sun-1800  . warfarin  4 mg Oral Once per day on Mon Tue Wed Thu Fri Sat  . Warfarin - Pharmacist Dosing Inpatient   Does not apply q1800    OBJECTIVE  Filed Vitals:   01/12/14 1342 01/12/14 1620 01/12/14 2022 01/13/14 0404  BP: 93/43 113/43 117/47 123/35  Pulse: 62 67 71 59  Temp: 97.9 F (36.6 C)  97.8 F (36.6 C) 97.5 F (36.4 C)  TempSrc: Oral  Oral Oral  Resp: 18  17 16  Height:      Weight:    175 lb 12.8 oz (79.742 kg)  SpO2: 96% 98% 98%     Intake/Output Summary (Last 24 hours) at 01/13/14 0835 Last data filed at 01/13/14 0600  Gross per 24 hour  Intake    480 ml  Output    850 ml  Net   -370 ml   Filed Weights   01/12/14 0516 01/13/14 0404  Weight: 180 lb (81.647 kg) 175 lb 12.8 oz (79.742 kg)    PHYSICAL EXAM  General: Pleasant, NAD. Neuro: Alert and oriented X 3. Moves all extremities spontaneously. Psych: Normal affect. HEENT:  Normal  Neck: Supple without bruits. Lungs:   Resp regular and unlabored, mild bibasilar rale, worse on R base Heart: RRR no s3, s4, or murmurs. Abdomen: Soft, non-tender, non-distended, BS + x 4.  Extremities: No clubbing, cyanosis. DP/PT/Radials 2+ and equal bilaterally. 0-1+ edema in bilateral LE  Accessory Clinical Findings  CBC  Recent Labs  01/12/14 0538  WBC 8.2  NEUTROABS 5.7  HGB 14.9  HCT 42.5  MCV 95.5  PLT 228   Basic Metabolic Panel  Recent Labs  01/12/14 0538 01/13/14 0412  NA 142 141  K 3.4* 5.8*  CL 102 103  CO2 23 25  GLUCOSE 121* 108*  BUN 21 33*  CREATININE 1.21 1.79*  CALCIUM 8.9 9.2   Cardiac Enzymes  Recent Labs  01/12/14 1343 01/12/14 2008 01/13/14 0412  TROPONINI 0.54* 0.54* 0.47*    TELE NSR with HR 50-90s, no significant ventricular ectopy    ECG  NSR with HR 70s, PVCs  Echocardiogram 01/12/2014  LV EF: 30% - 35%  ------------------------------------------------------------------- Indications: CHF - 428.0.  ------------------------------------------------------------------- History: PMH: Atrial fibrillation. Coronary artery disease. Transient ischemic attack. Risk factors: Hypertension.  ------------------------------------------------------------------- Study   Conclusions  - Left ventricle: There is global hypokinesis with akinesis of the basal and mid inferolateral walls. The cavity size was moderately dilated. There was moderate concentric hypertrophy. Systolic function was moderately to severely reduced. The estimated ejection fraction was in the range of 30% to 35%. Doppler parameters are consistent with a restrictive pattern, indicative of decreased left ventricular diastolic compliance and/or increased left atrial pressure (grade 3 diastolic dysfunction). Doppler parameters are consistent with elevated ventricular end-diastolic filling pressure. - Aortic valve: Bioprosthetic valve seems to be functioning normally. Normal transaortic gradients. There is  mild central regurgitation and no paravalvular leak. There was mild regurgitation directed eccentrically in the LVOT. Valve area (Vmax): 0.96 cm^2. - Aorta: The aorta was normal, not dilated, and non-diseased. Aortic root dimension: 40 mm (ED). - Mitral valve: There was moderate to severe regurgitation. - Left atrium: The atrium was moderately dilated. - Right ventricle: Systolic function was moderately reduced. - Atrial septum: No defect or patent foramen ovale was identified. - Tricuspid valve: There was mild regurgitation.  Impressions:  - When compared to the prior study from 03/26/2013 the left ventricle is now moderately dilated with moderate to severe systolic dysfunction and estimated LVEF 30-35%, previously 40-45%. There is restrictive pattern of diastolic dysfunction with elevated filling pressures. Mitral regurgitation is moderate to severe, previosuly mild. Right ventricular systolic function is moderately reduced     Radiology/Studies  Dg Chest Portable 1 View  01/12/2014   CLINICAL DATA:  Shortness of breath. Atrial fibrillation. Initial encounter  EXAM: PORTABLE CHEST - 1 VIEW  COMPARISON:  None.  FINDINGS: Diffuse interstitial and airspace opacity which is symmetric. Small pleural effusions are present. Background pulmonary hyperinflation. There is cardiomegaly. Previous median sternotomy for aortic valve replacement.  IMPRESSION: 1. CHF. 2. Probable background COPD. 3. Aortic valve replacement.   Electronically Signed   By: Jonathan  Watts M.D.   On: 01/12/2014 05:58    ASSESSMENT AND PLAN  1. Acute on chronic combined systolic and diastolic HF  - Last known EF was 40-45% in December 2014  - echo 01/12/2014 EF 30-35%, global hypokinesis with akinesis of basal and mid inferolateral wall, moderate LVH, grade 3 diastolic dysfunc, moderately reduced RV EF, moderate to severe MR, moderately dilated biatrial size.  - weight 180 --> 175 lbs ?accuracy, did not have  significant output despite Cr increased from 1.2 to 1.79 this am. K also increased from 3.4 to 5.8 without obvious hemolysis. ?lab, will continue lasix for now and repeat BMET. If lab accurate, then we may be looking at biventricular failure. May have to hold quinapril if Cr continue to worsen  2. Elevated troponin  - ?demand ischemia in the setting of a-fib with RVR and acute CHF vs coronary disease related  - however given worsening LV dysfunction, plan for L&RHC once HF symptom stable per Dr. Brackbill  - will hold coumadin today, start heparin, INR 2.3, plan for left and R heart cath.  3. A-fib with RVR  - spontaneously converted to NSR in ED after initiating IV diltiazem  4. H/o tissue aortic valve replacement 5. HTN 6. Hyperlipidemia 7. Hypokalemia: see above K went from 3.4 to 5.8  Signed, Meng, Hao PA-C Pager: [phone]  History and all data above reviewed.  Patient examined.  I agree with the findings as above.  The patient exam reveals  He is doing OK.  He denies any acute SOB or pain.  I have reviewed outside records for OK.  His creat is up   today.  COR:RRR  ,  Lungs: Few basilar crackles  ,  Abd: Positive bowel sounds, no rebound no guarding, Ext No edema  .  All available labs, radiology testing, previous records reviewed. Agree with documented assessment and plan.  Outside records reviewed.  All data as above reviewed.  He should, optimally have a right and left heart cath.  The left cath would need to be done from his radial since he has severe lower extremity PVD.  In anticipation of his cath I will hold his ACE inhibitor.  I will also stop his diuretic.  We are repeating a BMET today.  We will need a BMET in the AM and we can decide on the timing of cath.  He has baseline CKD. Hold the warfarin.  However, given his recent atrial fib and history of TIAs he will need bridging heparin.   Nadiah Corbit  10:43 AM  01/13/2014     

## 2014-01-14 NOTE — Progress Notes (Signed)
TR band removed at this time; no bleeding noted; site level 0; will cont. To monitor.

## 2014-01-14 NOTE — CV Procedure (Signed)
    Cardiac Catheterization Procedure Note  Name: Pele Stumpo MRN: [id number] DOB: April 29, 1930  Procedure: Right Heart Cath, Selective Coronary Angiography  Indication: 80 yo WM s/p AVR presents with symptoms of increased dyspnea. EF 35% by Echo with normal AV function.   Procedural Details: The right wrist was prepped, draped, and anesthetized with 1% lidocaine. Using the modified Seldinger technique a 6 Fr slender sheath was placed in the right radial artery and a 5 French sheath was placed in the right brachial vein. A Swan-Ganz catheter was used for the right heart catheterization. Standard protocol was followed for recording of right heart pressures and sampling of oxygen saturations. Fick cardiac output was calculated. Standard Judkins catheters were used for selective coronary angiography. There were no immediate procedural complications. The patient was transferred to the post catheterization recovery area for further monitoring.  Procedural Findings: Hemodynamics RA 16/13 mean 12 mm Hg RV 42/11/16 mm Hg PA 42/23 mean 25 mm Hg PCWP 20/20 mean 17 mm Hg LV N/A AO 122/43 mean 69 mm Hg  Oxygen saturations: PA 61% AO 90%  Cardiac Output (Fick) 4.53 L/min  Cardiac Index (Fick) 2.27 L/min/meter squared   Coronary angiography: Coronary dominance: right  Left mainstem: Normal  Left anterior descending (LAD): mild irregularities less than 20%   Left circumflex (LCx): 40-50% ostial LCx, otherwise normal.  Right coronary artery (RCA): Mild disease in the mid and distal vessel up to 20%.  Left ventriculography: Not done  Final Conclusions:   1. Nonobstructive CAD 2. Mild pulmonary HTN  Recommendations: Medical management.   Peter [place], MDFACC 01/14/2014, 12:11 PM

## 2014-01-14 NOTE — Discharge Instructions (Addendum)
Cardiac Diet This diet can help prevent heart disease and stroke. Many factors influence your heart health, including eating and exercise habits. Coronary risk rises a lot with abnormal blood fat (lipid) levels. Cardiac meal planning includes limiting unhealthy fats, increasing healthy fats, and making other small dietary changes. General guidelines are as follows:  Adjust calorie intake to reach and maintain desirable body weight.  Limit total fat intake to less than 30% of total calories. Saturated fat should be less than 7% of calories.  Saturated fats are found in animal products and in some vegetable products. Saturated vegetable fats are found in coconut oil, cocoa butter, palm oil, and palm kernel oil. Read labels carefully to avoid these products as much as possible. Use butter in moderation. Choose tub margarines and oils that have 2 grams of fat or less. Good cooking oils are canola and olive oils.  Practice low-fat cooking techniques. Do not fry food. Instead, broil, bake, boil, steam, grill, roast on a rack, stir-fry, or microwave it. Other fat reducing suggestions include:  Remove the skin from poultry.  Remove all visible fat from meats.  Skim the fat off stews, soups, and gravies before serving them.  Steam vegetables in water or broth instead of sauting them in fat.  Avoid foods with trans fat (or hydrogenated oils), such as commercially fried foods and commercially baked goods. Commercial shortening and deep-frying fats will contain trans fat.  Increase intake of fruits, vegetables, whole grains, and legumes to replace foods high in fat.  Increase consumption of nuts, legumes, and seeds to at least 4 servings weekly. One serving of a legume equals  cup, and 1 serving of nuts or seeds equals  cup.  Choose whole grains more often. Have 3 servings per day (a serving is 1 ounce [oz]).  Eat 4 to 5 servings of vegetables per day. A serving of vegetables is 1 cup of raw leafy  vegetables;  cup of raw or cooked cut-up vegetables;  cup of vegetable juice.  Eat 4 to 5 servings of fruit per day. A serving of fruit is 1 medium whole fruit;  cup of dried fruit;  cup of fresh, frozen, or canned fruit;  cup of 100% fruit juice.  Increase your intake of dietary fiber to 20 to 30 grams per day. Insoluble fiber may help lower your risk of heart disease and may help curb your appetite.  Soluble fiber binds cholesterol to be removed from the blood. Foods high in soluble fiber are dried beans, citrus fruits, oats, apples, bananas, broccoli, Brussels sprouts, and eggplant.  Try to include foods fortified with plant sterols or stanols, such as yogurt, breads, juices, or margarines. Choose several fortified foods to achieve a daily intake of 2 to 3 grams of plant sterols or stanols.  Foods with omega-3 fats can help reduce your risk of heart disease. Aim to have a 3.5 oz portion of fatty fish twice per week, such as salmon, mackerel, albacore tuna, sardines, lake trout, or herring. If you wish to take a fish oil supplement, choose one that contains 1 gram of both DHA and EPA.  Limit processed meats to 2 servings (3 oz portion) weekly.  Limit the sodium in your diet to 1500 milligrams (mg) per day. If you have high blood pressure, talk to a registered dietitian about a DASH (Dietary Approaches to Stop Hypertension) eating plan.  Limit sweets and beverages with added sugar, such as soda, to no more than 5 servings per week. One  serving is:   1 tablespoon sugar.  1 tablespoon jelly or jam.   cup sorbet.  1 cup lemonade.   cup regular soda. CHOOSING FOODS Starches  Allowed: Breads: All kinds (wheat, rye, raisin, white, oatmeal, [place], [place], and English muffin bread). Low-fat rolls: English muffins, frankfurter and hamburger buns, bagels, pita bread, tortillas (not fried). Pancakes, waffles, biscuits, and muffins made with recommended oil.  Avoid: Products made with  saturated or trans fats, oils, or whole milk products. Butter rolls, cheese breads, croissants. Commercial doughnuts, muffins, sweet rolls, biscuits, waffles, pancakes, store-bought mixes. Crackers  Allowed: Low-fat crackers and snacks: Animal, graham, rye, saltine (with recommended oil, no lard), oyster, and matzo crackers. Bread sticks, melba toast, rusks, flatbread, pretzels, and light popcorn.  Avoid: High-fat crackers: cheese crackers, butter crackers, and those made with coconut, palm oil, or trans fat (hydrogenated oils). Buttered popcorn. Cereals  Allowed: Hot or cold whole-grain cereals.  Avoid: Cereals containing coconut, hydrogenated vegetable fat, or animal fat. Potatoes / Pasta / Rice  Allowed: All kinds of potatoes, rice, and pasta (such as macaroni, spaghetti, and noodles).  Avoid: Pasta or rice prepared with cream sauce or high-fat cheese. Chow mein noodles, [place] fries. Vegetables  Allowed: All vegetables and vegetable juices.  Avoid: Fried vegetables. Vegetables in cream, butter, or high-fat cheese sauces. Limit coconut. Fruit in cream or custard. Protein  Allowed: Limit your intake of meat, seafood, and poultry to no more than 6 oz (cooked weight) per day. All lean, well-trimmed beef, veal, pork, and lamb. All chicken and [place] without skin. All fish and shellfish. Wild game: wild duck, rabbit, pheasant, and venison. Egg whites or low-cholesterol egg substitutes may be used as desired. Meatless dishes: recipes with dried beans, peas, lentils, and tofu (soybean curd). Seeds and nuts: all seeds and most nuts.  Avoid: Prime grade and other heavily marbled and fatty meats, such as short ribs, spare ribs, rib eye roast or steak, frankfurters, sausage, bacon, and high-fat luncheon meats, mutton. Caviar. Commercially fried fish. Domestic duck, goose, venison sausage. Organ meats: liver, gizzard, heart, chitterlings, brains, kidney, sweetbreads. Dairy  Allowed: Low-fat  cheeses: nonfat or low-fat cottage cheese (1% or 2% fat), cheeses made with part skim milk, such as mozzarella, farmers, string, or ricotta. (Cheeses should be labeled no more than 2 to 6 grams fat per oz.). Skim (or 1%) milk: liquid, powdered, or evaporated. Buttermilk made with low-fat milk. Drinks made with skim or low-fat milk or cocoa. Chocolate milk or cocoa made with skim or low-fat (1%) milk. Nonfat or low-fat yogurt.  Avoid: Whole milk cheeses, including colby, cheddar, [place], Monterey Jack, [place], [place], [place], American, Swiss, and blue. Creamed cottage cheese, cream cheese. Whole milk and whole milk products, including buttermilk or yogurt made from whole milk, drinks made from whole milk. Condensed milk, evaporated whole milk, and 2% milk. Soups and Combination Foods  Allowed: Low-fat low-sodium soups: broth, dehydrated soups, homemade broth, soups with the fat removed, homemade cream soups made with skim or low-fat milk. Low-fat spaghetti, lasagna, chili, and Spanish rice if low-fat ingredients and low-fat cooking techniques are used.  Avoid: Cream soups made with whole milk, cream, or high-fat cheese. All other soups. Desserts and Sweets  Allowed: Sherbet, fruit ices, gelatins, meringues, and angel food cake. Homemade desserts with recommended fats, oils, and milk products. Jam, jelly, honey, marmalade, sugars, and syrups. Pure sugar candy, such as gum drops, hard candy, jelly beans, marshmallows, mints, and small amounts of dark chocolate.  Avoid: Commercially prepared  cakes, pies, cookies, frosting, pudding, or mixes for these products. Desserts containing whole milk products, chocolate, coconut, lard, palm oil, or palm kernel oil. Ice cream or ice cream drinks. Candy that contains chocolate, coconut, butter, hydrogenated fat, or unknown ingredients. Buttered syrups. Fats and Oils  Allowed: Vegetable oils: safflower, sunflower, corn, soybean, cottonseed, sesame, canola, olive,  or peanut. Non-hydrogenated margarines. Salad dressing or mayonnaise: homemade or commercial, made with a recommended oil. Low or nonfat salad dressing or mayonnaise.  Limit added fats and oils to 6 to 8 tsp per day (includes fats used in cooking, baking, salads, and spreads on bread). Remember to count the "hidden fats" in foods.  Avoid: Solid fats and shortenings: butter, lard, salt pork, bacon drippings. Gravy containing meat fat, shortening, or suet. Cocoa butter, coconut. Coconut oil, palm oil, palm kernel oil, or hydrogenated oils: these ingredients are often used in bakery products, nondairy creamers, whipped toppings, candy, and commercially fried foods. Read labels carefully. Salad dressings made of unknown oils, sour cream, or cheese, such as blue cheese and Roquefort. Cream, all kinds: half-and-half, light, heavy, or whipping. Sour cream or cream cheese (even if "light" or low-fat). Nondairy cream substitutes: coffee creamers and sour cream substitutes made with palm, palm kernel, hydrogenated oils, or coconut oil. Beverages  Allowed: Coffee (regular or decaffeinated), tea. Diet carbonated beverages, mineral water. Alcohol: Check with your caregiver. Moderation is recommended.  Avoid: Whole milk, regular sodas, and juice drinks with added sugar. Condiments  Allowed: All seasonings and condiments. Cocoa powder. "Cream" sauces made with recommended ingredients.  Avoid: Carob powder made with hydrogenated fats. SAMPLE MENU Breakfast   cup orange juice   cup oatmeal  1 slice toast  1 tsp margarine  1 cup skim milk Lunch  [place] sandwich with 2 oz [place], 2 slices bread  Lettuce and tomato slices  Fresh fruit  Carrot sticks  Coffee or tea Snack  Fresh fruit or low-fat crackers Dinner  3 oz lean ground beef  1 baked potato  1 tsp margarine   cup asparagus  Lettuce salad  1 tbs non-creamy dressing   cup peach slices  1 cup skim milk Document Released:  12/22/2007 Document Revised: 09/13/2011 Document Reviewed: 05/14/2013 [employer] Patient Information 2015 [place] [place]. This information is not intended to replace advice given to you by your health care provider. Make sure you discuss any questions you have with your health care provider.   Low-Sodium Eating Plan Sodium raises blood pressure and causes water to be held in the body. Getting less sodium from food will help lower your blood pressure, reduce any swelling, and protect your heart, liver, and kidneys. We get sodium by adding salt (sodium chloride) to food. Most of our sodium comes from canned, boxed, and frozen foods. Restaurant foods, fast foods, and pizza are also very high in sodium. Even if you take medicine to lower your blood pressure or to reduce fluid in your body, getting less sodium from your food is important. WHAT IS MY PLAN? Most people should limit their sodium intake to 2,300 mg a day. Your health care provider recommends that you limit your sodium intake to _____less than 2g_____ a day.  WHAT DO I NEED TO KNOW ABOUT THIS EATING PLAN? For the low-sodium eating plan, you will follow these general guidelines:  Choose foods with a % Daily Value for sodium of less than 5% (as listed on the food label).   Use salt-free seasonings or herbs instead of table salt or sea  salt.   Check with your health care provider or pharmacist before using salt substitutes.   Eat fresh foods.  Eat more vegetables and fruits.  Limit canned vegetables. If you do use them, rinse them well to decrease the sodium.   Limit cheese to 1 oz (28 g) per day.   Eat lower-sodium products, often labeled as "lower sodium" or "no salt added."  Avoid foods that contain monosodium glutamate (MSG). MSG is sometimes added to [place] food and some canned foods.  Check food labels (Nutrition Facts labels) on foods to learn how much sodium is in one serving.  Eat more home-cooked food and less  restaurant, buffet, and fast food.  When eating at a restaurant, ask that your food be prepared with less salt or none, if possible.  HOW DO I READ FOOD LABELS FOR SODIUM INFORMATION? The Nutrition Facts label lists the amount of sodium in one serving of the food. If you eat more than one serving, you must multiply the listed amount of sodium by the number of servings. Food labels may also identify foods as:  Sodium free--Less than 5 mg in a serving.  Very low sodium--35 mg or less in a serving.  Low sodium--140 mg or less in a serving.  Light in sodium--50% less sodium in a serving. For example, if a food that usually has 300 mg of sodium is changed to become light in sodium, it will have 150 mg of sodium.  Reduced sodium--25% less sodium in a serving. For example, if a food that usually has 400 mg of sodium is changed to reduced sodium, it will have 300 mg of sodium. WHAT FOODS CAN I EAT? Grains Low-sodium cereals, including oats, puffed wheat and rice, and shredded wheat cereals. Low-sodium crackers. Unsalted rice and pasta. Lower-sodium bread.  Vegetables Frozen or fresh vegetables. Low-sodium or reduced-sodium canned vegetables. Low-sodium or reduced-sodium tomato sauce and paste. Low-sodium or reduced-sodium tomato and vegetable juices.  Fruits Fresh, frozen, and canned fruit. Fruit juice.  Meat and Other Protein Products Low-sodium canned tuna and salmon. Fresh or frozen meat, poultry, seafood, and fish. Lamb. Unsalted nuts. Dried beans, peas, and lentils without added salt. Unsalted canned beans. Homemade soups without salt. Eggs.  Dairy Milk. Soy milk. Ricotta cheese. Low-sodium or reduced-sodium cheeses. Yogurt.  Condiments Fresh and dried herbs and spices. Salt-free seasonings. Onion and garlic powders. Low-sodium varieties of mustard and ketchup. Lemon juice.  Fats and Oils Reduced-sodium salad dressings. Unsalted butter.  Other Unsalted popcorn and pretzels.    The items listed above may not be a complete list of recommended foods or beverages. Contact your dietitian for more options. WHAT FOODS ARE NOT RECOMMENDED? Grains Instant hot cereals. Bread stuffing, pancake, and biscuit mixes. Croutons. Seasoned rice or pasta mixes. Noodle soup cups. Boxed or frozen macaroni and cheese. Self-rising flour. Regular salted crackers. Vegetables Regular canned vegetables. Regular canned tomato sauce and paste. Regular tomato and vegetable juices. Frozen vegetables in sauces. Salted french fries. Olives. Rosita Fire. Relishes. Sauerkraut. Salsa. Meat and Other Protein Products Salted, canned, smoked, spiced, or pickled meats, seafood, or fish. Bacon, ham, sausage, hot dogs, corned beef, chipped beef, and packaged luncheon meats. Salt pork. Jerky. Pickled herring. Anchovies, regular canned tuna, and sardines. Salted nuts. Dairy Processed cheese and cheese spreads. Cheese curds. Blue cheese and cottage cheese. Buttermilk.  Condiments Onion and garlic salt, seasoned salt, table salt, and sea salt. Canned and packaged gravies. Worcestershire sauce. Tartar sauce. Barbecue sauce. Teriyaki sauce. Soy sauce, including reduced  sodium. Steak sauce. Fish sauce. Oyster sauce. Cocktail sauce. Horseradish. Regular ketchup and mustard. Meat flavorings and tenderizers. Bouillon cubes. Hot sauce. Tabasco sauce. Marinades. Taco seasonings. Relishes. Fats and Oils Regular salad dressings. Salted butter. Margarine. Ghee. Bacon fat.  Other Potato and tortilla chips. Corn chips and puffs. Salted popcorn and pretzels. Canned or dried soups. Pizza. Frozen entrees and pot pies.  The items listed above may not be a complete list of foods and beverages to avoid. Contact your dietitian for more information. Document Released: 09/03/2001 Document Revised: 03/19/2013 Document Reviewed: 01/16/2013 [facility] Patient Information 2015 [place], [place]. This information is not intended to replace  advice given to you by your health care provider. Make sure you discuss any questions you have with your health care provider.    Information on my medicine - Coumadin   (Warfarin)  This medication education was reviewed with me or my healthcare representative as part of my discharge preparation.    Why was Coumadin prescribed for you? Coumadin was prescribed for you because you have a blood clot or a medical condition that can cause an increased risk of forming blood clots. Blood clots can cause serious health problems by blocking the flow of blood to the heart, lung, or brain. Coumadin can prevent harmful blood clots from forming. As a reminder your indication for Coumadin is:   Stroke Prevention Because Of Atrial Fibrillation  What test will check on my response to Coumadin? While on Coumadin (warfarin) you will need to have an INR test regularly to ensure that your dose is keeping you in the desired range. The INR (international normalized ratio) number is calculated from the result of the laboratory test called prothrombin time (PT).  If an INR APPOINTMENT HAS NOT ALREADY BEEN MADE FOR YOU please schedule an appointment to have this lab work done by your health care provider within 7 days. Your INR goal is usually a number between:  2 to 3.  What  do you need to  know  About  COUMADIN? Take Coumadin (warfarin) exactly as prescribed by your healthcare provider about the same time each day.  DO NOT stop taking without talking to the doctor who prescribed the medication.  Stopping without other blood clot prevention medication to take the place of Coumadin may increase your risk of developing a new clot or stroke.  Get refills before you run out.  What do you do if you miss a dose? If you miss a dose, take it as soon as you remember on the same day then continue your regularly scheduled regimen the next day.  Do not take two doses of Coumadin at the same time.  Important Safety  Information A possible side effect of Coumadin (Warfarin) is an increased risk of bleeding. You should call your healthcare provider right away if you experience any of the following:   Bleeding from an injury or your nose that does not stop.   Unusual colored urine (red or dark brown) or unusual colored stools (red or black).   Unusual bruising for unknown reasons.   A serious fall or if you hit your head (even if there is no bleeding).  Some foods or medicines interact with Coumadin (warfarin) and might alter your response to warfarin. To help avoid this:   Eat a balanced diet, maintaining a consistent amount of Vitamin K.   Notify your provider about major diet changes you plan to make.   Avoid alcohol or limit your intake to 1 drink  for women and 2 drinks for men per day. (1 drink is 5 oz. wine, 12 oz. beer, or 1.5 oz. liquor.)  Make sure that ANY health care provider who prescribes medication for you knows that you are taking Coumadin (warfarin).  Also make sure the healthcare provider who is monitoring your Coumadin knows when you have started a new medication including herbals and non-prescription products.  Coumadin (Warfarin)  Major Drug Interactions  Increased Warfarin Effect Decreased Warfarin Effect  Alcohol (large quantities) Antibiotics (esp. Septra/Bactrim, Flagyl, Cipro) Amiodarone (Cordarone) Aspirin (ASA) Cimetidine (Tagamet) Megestrol (Megace) NSAIDs (ibuprofen, naproxen, etc.) Piroxicam (Feldene) Propafenone (Rythmol SR) Propranolol (Inderal) Isoniazid (INH) Posaconazole (Noxafil) Barbiturates (Phenobarbital) Carbamazepine (Tegretol) Chlordiazepoxide (Librium) Cholestyramine (Questran) Griseofulvin Oral Contraceptives Rifampin Sucralfate (Carafate) Vitamin K   Coumadin (Warfarin) Major Herbal Interactions  Increased Warfarin Effect Decreased Warfarin Effect  Garlic Ginseng Ginkgo biloba Coenzyme Q10 Green tea St. Johns wort    Coumadin (Warfarin)  FOOD Interactions  Eat a consistent number of servings per week of foods HIGH in Vitamin K (1 serving =  cup)  Collards (cooked, or boiled & drained) Kale (cooked, or boiled & drained) Mustard greens (cooked, or boiled & drained) Parsley *serving size only =  cup Spinach (cooked, or boiled & drained) Swiss chard (cooked, or boiled & drained) Turnip greens (cooked, or boiled & drained)  Eat a consistent number of servings per week of foods MEDIUM-HIGH in Vitamin K (1 serving = 1 cup)  Asparagus (cooked, or boiled & drained) Broccoli (cooked, boiled & drained, or raw & chopped) Brussel sprouts (cooked, or boiled & drained) *serving size only =  cup Lettuce, raw (green leaf, endive, romaine) Spinach, raw Turnip greens, raw & chopped   These websites have more information on Coumadin (warfarin):  http://www.king-russell.com/; https://www.hines.net/;   Heart Failure Heart failure is a condition in which the heart has trouble pumping blood. This means your heart does not pump blood efficiently for your body to work well. In some cases of heart failure, fluid may back up into your lungs or you may have swelling (edema) in your lower legs. Heart failure is usually a long-term (chronic) condition. It is important for you to take good care of yourself and follow your health care provider's treatment plan. CAUSES  Some health conditions can cause heart failure. Those health conditions include:  High blood pressure (hypertension). Hypertension causes the heart muscle to work harder than normal. When pressure in the blood vessels is high, the heart needs to pump (contract) with more force in order to circulate blood throughout the body. High blood pressure eventually causes the heart to become stiff and weak.  Coronary artery disease (CAD). CAD is the buildup of cholesterol and fat (plaque) in the arteries of the heart. The blockage in the arteries deprives the heart muscle of oxygen and blood.  This can cause chest pain and may lead to a heart attack. High blood pressure can also contribute to CAD.  Heart attack (myocardial infarction). A heart attack occurs when one or more arteries in the heart become blocked. The loss of oxygen damages the muscle tissue of the heart. When this happens, part of the heart muscle dies. The injured tissue does not contract as well and weakens the heart's ability to pump blood.  Abnormal heart valves. When the heart valves do not open and close properly, it can cause heart failure. This makes the heart muscle pump harder to keep the blood flowing.  Heart muscle disease (cardiomyopathy or myocarditis). Heart  muscle disease is damage to the heart muscle from a variety of causes. These can include drug or alcohol abuse, infections, or unknown reasons. These can increase the risk of heart failure.  Lung disease. Lung disease makes the heart work harder because the lungs do not work properly. This can cause a strain on the heart, leading it to fail.  Diabetes. Diabetes increases the risk of heart failure. High blood sugar contributes to high fat (lipid) levels in the blood. Diabetes can also cause slow damage to tiny blood vessels that carry important nutrients to the heart muscle. When the heart does not get enough oxygen and food, it can cause the heart to become weak and stiff. This leads to a heart that does not contract efficiently.  Other conditions can contribute to heart failure. These include abnormal heart rhythms, thyroid problems, and low blood counts (anemia). Certain unhealthy behaviors can increase the risk of heart failure, including:  Being overweight.  Smoking or chewing tobacco.  Eating foods high in fat and cholesterol.  Abusing illicit drugs or alcohol.  Lacking physical activity. SYMPTOMS  Heart failure symptoms may vary and can be hard to detect. Symptoms may include:  Shortness of breath with activity, such as climbing  stairs.  Persistent cough.  Swelling of the feet, ankles, legs, or abdomen.  Unexplained weight gain.  Difficulty breathing when lying flat (orthopnea).  Waking from sleep because of the need to sit up and get more air.  Rapid heartbeat.  Fatigue and loss of energy.  Feeling light-headed, dizzy, or close to fainting.  Loss of appetite.  Nausea.  Increased urination during the night (nocturia). DIAGNOSIS  A diagnosis of heart failure is based on your history, symptoms, physical examination, and diagnostic tests. Diagnostic tests for heart failure may include:  Echocardiography.  Electrocardiography.  Chest X-ray.  Blood tests.  Exercise stress test.  Cardiac angiography.  Radionuclide scans. TREATMENT  Treatment is aimed at managing the symptoms of heart failure. Medicines, behavioral changes, or surgical intervention may be necessary to treat heart failure.  Medicines to help treat heart failure may include:  Angiotensin-converting enzyme (ACE) inhibitors. This type of medicine blocks the effects of a blood protein called angiotensin-converting enzyme. ACE inhibitors relax (dilate) the blood vessels and help lower blood pressure.  Angiotensin receptor blockers (ARBs). This type of medicine blocks the actions of a blood protein called angiotensin. Angiotensin receptor blockers dilate the blood vessels and help lower blood pressure.  Water pills (diuretics). Diuretics cause the kidneys to remove salt and water from the blood. The extra fluid is removed through urination. This loss of extra fluid lowers the volume of blood the heart pumps.  Beta blockers. These prevent the heart from beating too fast and improve heart muscle strength.  Digitalis. This increases the force of the heartbeat.  Healthy behavior changes include:  Obtaining and maintaining a healthy weight.  Stopping smoking or chewing tobacco.  Eating heart-healthy foods.  Limiting or avoiding  alcohol.  Stopping illicit drug use.  Physical activity as directed by your health care provider.  Surgical treatment for heart failure may include:  A procedure to open blocked arteries, repair damaged heart valves, or remove damaged heart muscle tissue.  A pacemaker to improve heart muscle function and control certain abnormal heart rhythms.  An internal cardioverter defibrillator to treat certain serious abnormal heart rhythms.  A left ventricular assist device (LVAD) to assist the pumping ability of the heart. HOME CARE INSTRUCTIONS   Take medicines only  as directed by your health care provider. Medicines are important in reducing the workload of your heart, slowing the progression of heart failure, and improving your symptoms.  Do not stop taking your medicine unless directed by your health care provider.  Do not skip any dose of medicine.  Refill your prescriptions before you run out of medicine. Your medicines are needed every day.  Engage in moderate physical activity if directed by your health care provider. Moderate physical activity can benefit some people. The elderly and people with severe heart failure should consult with a health care provider for physical activity recommendations.  Eat heart-healthy foods. Food choices should be free of trans fat and low in saturated fat, cholesterol, and salt (sodium). Healthy choices include fresh or frozen fruits and vegetables, fish, lean meats, legumes, fat-free or low-fat dairy products, and whole grain or high fiber foods. Talk to a dietitian to learn more about heart-healthy foods.  Limit sodium if directed by your health care provider. Sodium restriction may reduce symptoms of heart failure in some people. Talk to a dietitian to learn more about heart-healthy seasonings.  Use healthy cooking methods. Healthy cooking methods include roasting, grilling, broiling, baking, poaching, steaming, or stir-frying. Talk to a dietitian to  learn more about healthy cooking methods.  Limit fluids if directed by your health care provider. Fluid restriction may reduce symptoms of heart failure in some people.  Weigh yourself every day. Daily weights are important in the early recognition of excess fluid. You should weigh yourself every morning after you urinate and before you eat breakfast. Wear the same amount of clothing each time you weigh yourself. Record your daily weight. Provide your health care provider with your weight record.  Monitor and record your blood pressure if directed by your health care provider.  Check your pulse if directed by your health care provider.  Lose weight if directed by your health care provider. Weight loss may reduce symptoms of heart failure in some people.  Stop smoking or chewing tobacco. Nicotine makes your heart work harder by causing your blood vessels to constrict. Do not use nicotine gum or patches before talking to your health care provider.  Keep all follow-up visits as directed by your health care provider. This is important.  Limit alcohol intake to no more than 1 drink per day for nonpregnant women and 2 drinks per day for men. One drink equals 12 ounces of beer, 5 ounces of wine, or 1 ounces of hard liquor. Drinking more than that is harmful to your heart. Tell your health care provider if you drink alcohol several times a week. Talk with your health care provider about whether alcohol is safe for you. If your heart has already been damaged by alcohol or you have severe heart failure, drinking alcohol should be stopped completely.  Stop illicit drug use.  Stay up-to-date with immunizations. It is especially important to prevent respiratory infections through current pneumococcal and influenza immunizations.  Manage other health conditions such as hypertension, diabetes, thyroid disease, or abnormal heart rhythms as directed by your health care provider.  Learn to manage  stress.  Plan rest periods when fatigued.  Learn strategies to manage high temperatures. If the weather is extremely hot:  Avoid vigorous physical activity.  Use air conditioning or fans or seek a cooler location.  Avoid caffeine and alcohol.  Wear loose-fitting, lightweight, and light-colored clothing.  Learn strategies to manage cold temperatures. If the weather is extremely cold:  Avoid vigorous physical  activity.  Layer clothes.  Wear mittens or gloves, a hat, and a scarf when going outside.  Avoid alcohol.  Obtain ongoing education and support as needed.  Participate in or seek rehabilitation as needed to maintain or improve independence and quality of life. SEEK MEDICAL CARE IF:   Your weight increases by 03 lb/1.4 kg in 1 day or 05 lb/2.3 kg in a week.  You have increasing shortness of breath that is unusual for you.  You are unable to participate in your usual physical activities.  You tire easily.  You cough more than normal, especially with physical activity.  You have any or more swelling in areas such as your hands, feet, ankles, or abdomen.  You are unable to sleep because it is hard to breathe.  You feel like your heart is beating fast (palpitations).  You become dizzy or light-headed upon standing up. SEEK IMMEDIATE MEDICAL CARE IF:   You have difficulty breathing.  There is a change in mental status such as decreased alertness or difficulty with concentration.  You have a pain or discomfort in your chest.  You have an episode of fainting (syncope). MAKE SURE YOU:   Understand these instructions.  Will watch your condition.  Will get help right away if you are not doing well or get worse. Document Released: 03/14/2005 Document Revised: 07/29/2013 Document Reviewed: 04/13/2012 [facility] Patient Information 2015 [employer], [place]. This information is not intended to replace advice given to you by your health care provider. Make sure you discuss  any questions you have with your health care provider.

## 2014-01-14 NOTE — Interval H&P Note (Signed)
History and Physical Interval Note:  01/14/2014 11:18 AM  Timothy Durham  has presented today for surgery, with the diagnosis of cp  The various methods of treatment have been discussed with the patient and family. After consideration of risks, benefits and other options for treatment, the patient has consented to  Procedure(s): LEFT AND RIGHT HEART CATHETERIZATION WITH CORONARY ANGIOGRAM (N/A) as a surgical intervention .  The patient's history has been reviewed, patient examined, no change in status, stable for surgery.  I have reviewed the patient's chart and labs.  Questions were answered to the patient's satisfaction.    Cath Lab Visit (complete for each Cath Lab visit)  Clinical Evaluation Leading to the Procedure:   ACS: Yes.    Non-ACS:    Anginal Classification: CCS III  Anti-ischemic medical therapy: Minimal Therapy (1 class of medications)  Non-Invasive Test Results: No non-invasive testing performed  Prior CABG: No previous CABG       Theron Arista [facility] 01/14/2014 11:18 AM

## 2014-01-14 NOTE — Care Management Note (Signed)
    Page 1 of 1   01/16/2014     4:51:06 PM CARE MANAGEMENT NOTE 01/16/2014  Patient:  Timothy Durham, Timothy Durham   Account Number:  [id number]  Date Initiated:  01/14/2014  Documentation initiated by:  Rumaldo Difatta  Subjective/Objective Assessment:   Pt adm on 01/12/14 with CHF exacerbation.  PTA, pt resides at home with spouse.     Action/Plan:   Will follow for dc needs as pt progresses.  May benefit from [facility] at dc for CHF follow up.   Anticipated DC Date:  01/17/2014   Anticipated DC Plan:  HOME W HOME HEALTH SERVICES      DC Planning Services  CM consult      Choice offered to / List presented to:             Status of service:  Completed, signed off Medicare Important Message given?  YES (If response is "NO", the following Medicare IM given date fields will be blank) Date Medicare IM given:  01/15/2014 Medicare IM given by:  Seanne Chirico Date Additional Medicare IM given:   Additional Medicare IM given by:    Discharge Disposition:  HOME/SELF CARE  Per UR Regulation:  Reviewed for med. necessity/level of care/duration of stay  If discussed at Long Length of Stay Meetings, dates discussed:    Comments:

## 2014-01-14 NOTE — Progress Notes (Signed)
Pt requesting breathing tx; RT paged and made aware; RT to see pt soon; will cont. To monitor.

## 2014-01-14 NOTE — Progress Notes (Signed)
Pt wife concerned about pt's anxiety at this time regarding wanting to d/c home; PO Xanax ordered X1 at this time; will cont. To monitor.

## 2014-01-14 NOTE — Progress Notes (Addendum)
ANTICOAGULATION CONSULT NOTE - Initial Consult  Pharmacy Consult for heparin Indication: atrial fibrillation  Allergies  Allergen Reactions  . Ambien [Zolpidem] Other (See Comments)    Hallucinations  . Ativan [Lorazepam] Other (See Comments)    hallucinations  . Penicillins     Unknown childhood reaction   . Sulfa Antibiotics     unknown    Patient Measurements: Height: 5\' 11"  (180.3 cm) Weight: 176 lb 4.8 oz (79.969 kg) IBW/kg (Calculated) : 75.3 Heparin Dosing Weight: 80 kg  Vital Signs: Temp: 97.7 F (36.5 C) (10/20 0851) Temp Source: Oral (10/20 0851) BP: 127/43 mmHg (10/20 0851) Pulse Rate: 65 (10/20 0851)  Labs:  Recent Labs  01/12/14 0538  01/12/14 1343 01/12/14 2008 01/13/14 0412 01/13/14 1045 01/13/14 1837 01/14/14 0456  HGB 14.9  --   --   --   --   --   --   --   HCT 42.5  --   --   --   --   --   --   --   PLT 228  --   --   --   --   --   --   --   LABPROT 27.9*  --   --   --  25.5*  --  23.3* 22.0*  INR 2.58*  --   --   --  2.30*  --  2.05* 1.90*  CREATININE 1.21  --   --   --  1.79* 1.65*  --  1.65*  TROPONINI <0.30  < > 0.54* 0.54* 0.47*  --   --   --   < > = values in this interval not displayed.  Estimated Creatinine Clearance: 37.4 ml/min (by C-G formula based on Cr of 1.65).   Medical History: Past Medical History  Diagnosis Date  . Arthritis   . Coronary artery disease   . Hypertension   . Stroke     July 2000  January 2007  . Renal disorder     non acute  . GERD (gastroesophageal reflux disease)   . Foot pain, bilateral   . H/O hematuria   . Hyperlipidemia   . Vitamin D deficiency   . CHF (congestive heart failure)   . Dysrhythmia     HISTORY OF ATRIAL FIBRILATION  . Shortness of breath    Assessment: 80 yo male on warfarin PTA for AFib, with troponin elevation, plan for cath soon, currently holding coumadin, INR 1.9 this morning, will start IV heparin with no bolus. Plan for cath this afternoon or tomorrow.  Coumadin  PTA dose: 1 mg on Sundays 4 mg on all other days.  Goal of Therapy:  INR 2-3 Monitor platelets by anticoagulation protocol: Yes    Plan:  - Continue holding coumadin - Start IV heparin 1050 units/hr - F/u 8 hr heparin level at 1700 if not going to cath this afternoon. - Daily INR - f/u restart coumadin after cath  Bayard Hugger, PharmD, BCPS  Clinical Pharmacist  Pager: [phone]  01/14/2014 9:16 AM     Addendum: S/p cath, nonobstructive CAD, mild pulmonary HTN, recommended for medical management. Heparin D/C'd, pharmacy is consulted to resume coumadin INR 1.9 this AM, no vitamin K. Given  Plan: Coumadin 4mg  po x 1 per home dose F/u PT/INR  Addn: Pt's INR was subtherapeutic following cath at 1.90. Pharmacy is consulted to resume heparin for Afib while INR is subtherapeutic on coumadin. Will restart heparin at 1050 units/hr 6 hour following sheath removal, which  was at 1210. Will check level with AM labs.  Arlean Hoppingorey M. Newman PiesBall, PharmD Clinical Pharmacist Pager [phone]

## 2014-01-14 NOTE — Progress Notes (Signed)
[facility] PROGRESS NOTE  Timothy Durham VAP:[id number] DOB: [birth year] DOA: 01/12/2014 PCP: Timothy Bast, MD Interim summary: Timothy Durham is a 80 y.o. male with PMH of HTN, CKD, PAD s/p stent (plavix), CVA with residual L sided weakness, s/p AVR, A fib (coumadin), CHF (not on diuretics x 6 month) presented progressive SOB, DOE, some PNDs for few days, associated with mild leg edema and found to have acuet CHF, A fib RVR in ED;. He was admitted to telemetry. Cardiology consulted and plan for catheterization int he near future, as soon as INR  And creatinine improves.   Assessment/Plan: 1. Acute on chronic systolic heart failure: - diuresis on hold for renal function. He doesn t appear to be in respiratory distress at this time. Echocardiogram shows decline in the LVEF.  Cardiology consulted and plan for cardiac cath today. Monitor electrolytes .   2. afib with RVR: Off Cardizem drip. Continue to monitor. Rate controlled.   3. PAD: Resume home meds.    4. CVA with left sided weakness: No new deficits.   Hypertension:  better controlled.   Code Status: full code.  Family Communication: discussed with wife at bedside Disposition Plan: pending further investigation.    Consultants: Cardiology  Procedures:  echocardiogram   Antibiotics:  none  HPI/Subjective: Comfortable, denies sob, chest pain , palpitations.  Objective: Filed Vitals:   01/14/14 1134  BP:   Pulse: 65  Temp:   Resp:     Intake/Output Summary (Last 24 hours) at 01/14/14 1237 Last data filed at 01/13/14 1814  Gross per 24 hour  Intake    240 ml  Output    650 ml  Net   -410 ml   Filed Weights   01/12/14 0516 01/13/14 0404 01/14/14 0500  Weight: 81.647 kg (180 lb) 79.742 kg (175 lb 12.8 oz) 79.969 kg (176 lb 4.8 oz)    Exam:   General:  Alert afebrile comfortable  Cardiovascular: s1s2  Respiratory: ctab  Abdomen: soft NT nd bs+  Musculoskeletal: no pedal edema.   Data  Reviewed: Basic Metabolic Panel:  Recent Labs Lab 01/12/14 0538 01/13/14 0412 01/13/14 1045 01/13/14 1400 01/14/14 0456  NA 142 141 139  --  138  K 3.4* 5.8* 4.8 5.1 4.8  CL 102 103 102  --  98  CO2 23 25 22   --  26  GLUCOSE 121* 108* 109*  --  104*  BUN 21 33* 33*  --  38*  CREATININE 1.21 1.79* 1.65*  --  1.65*  CALCIUM 8.9 9.2 9.1  --  9.1   Liver Function Tests: No results found for this basename: AST, ALT, ALKPHOS, BILITOT, PROT, ALBUMIN,  in the last 168 hours No results found for this basename: LIPASE, AMYLASE,  in the last 168 hours No results found for this basename: AMMONIA,  in the last 168 hours CBC:  Recent Labs Lab 01/12/14 0538  WBC 8.2  NEUTROABS 5.7  HGB 14.9  HCT 42.5  MCV 95.5  PLT 228   Cardiac Enzymes:  Recent Labs Lab 01/12/14 0538 01/12/14 0845 01/12/14 1343 01/12/14 2008 01/13/14 0412  TROPONINI <0.30 0.30* 0.54* 0.54* 0.47*   BNP (last 3 results)  Recent Labs  01/12/14 0539  PROBNP 4853.0*   CBG: No results found for this basename: GLUCAP,  in the last 168 hours  No results found for this or any previous visit (from the past 240 hour(s)).   Studies: No results found.  Scheduled Meds: . allopurinol  200 mg Oral  Daily  . atorvastatin  40 mg Oral QPM  . bismuth subsalicylate  30 mL Oral TID AC & HS  . cholecalciferol  1,000 Units Oral q morning - 10a  . clopidogrel  75 mg Oral Daily  . LORazepam  0.5 mg Oral Once  . metoprolol tartrate  25 mg Oral BID  . pantoprazole  40 mg Oral Daily   Continuous Infusions: . sodium chloride      Active Problems:   CHF exacerbation   Acute combined systolic and diastolic heart failure   Elevated troponin    Time spent: 35 minutes     Timothy Durham  [facility] Pager [phone]. If 7PM-7AM, please contact night-coverage at [email or web address], password [email or web address] 01/14/2014, 12:37 PM  LOS: 2 days

## 2014-01-14 NOTE — Progress Notes (Signed)
Patient stated he does not want to wear CPAP at this time.

## 2014-01-14 NOTE — Progress Notes (Signed)
Patient Name: Timothy CanterburyRoy Viviano Date of Encounter: 01/14/2014     Active Problems:   CHF exacerbation   Acute combined systolic and diastolic heart failure   Elevated troponin    SUBJECTIVE  Denies any significant SOB or chest discomfort overnight. Had 1 cup of coffee and a little orange juice, otherwise no food this morning. States he has been taking coumadin for stroke, had 3 stroke in the past.  CURRENT MEDS . allopurinol  200 mg Oral Daily  . atorvastatin  40 mg Oral QPM  . bismuth subsalicylate  30 mL Oral TID AC & HS  . cholecalciferol  1,000 Units Oral q morning - 10a  . clopidogrel  75 mg Oral Daily  . LORazepam  0.5 mg Oral Once  . metoprolol tartrate  25 mg Oral BID  . pantoprazole  40 mg Oral Daily    OBJECTIVE  Filed Vitals:   01/13/14 1351 01/13/14 1853 01/13/14 2030 01/14/14 0500  BP: 118/40  110/37 127/45  Pulse: 64  63 76  Temp: 98 F (36.7 C)  97.5 F (36.4 C) 97.7 F (36.5 C)  TempSrc: Oral  Oral Oral  Resp: 18  18 18   Height:    5\' 11"  (1.803 m)  Weight:    176 lb 4.8 oz (79.969 kg)  SpO2: 99% 99% 95% 96%    Intake/Output Summary (Last 24 hours) at 01/14/14 0738 Last data filed at 01/13/14 1814  Gross per 24 hour  Intake    480 ml  Output    650 ml  Net   -170 ml   Filed Weights   01/12/14 0516 01/13/14 0404 01/14/14 0500  Weight: 180 lb (81.647 kg) 175 lb 12.8 oz (79.742 kg) 176 lb 4.8 oz (79.969 kg)    PHYSICAL EXAM  General: Pleasant, NAD. Neuro: Alert and oriented X 3. Moves all extremities spontaneously. Psych: Normal affect. HEENT:  Normal  Neck: Supple without bruits or JVD. Lungs:  Resp regular and unlabored, CTA. Heart: RRR no s3, s4, or murmurs. Abdomen: Soft, non-tender, non-distended, BS + x 4.  Extremities: No clubbing, cyanosis or edema. DP/PT/Radials 2+ and equal bilaterally.  Accessory Clinical Findings  CBC  Recent Labs  01/12/14 0538  WBC 8.2  NEUTROABS 5.7  HGB 14.9  HCT 42.5  MCV 95.5  PLT 228    Basic Metabolic Panel  Recent Labs  01/13/14 1045 01/13/14 1400 01/14/14 0456  NA 139  --  138  K 4.8 5.1 4.8  CL 102  --  98  CO2 22  --  26  GLUCOSE 109*  --  104*  BUN 33*  --  38*  CREATININE 1.65*  --  1.65*  CALCIUM 9.1  --  9.1   Cardiac Enzymes  Recent Labs  01/12/14 1343 01/12/14 2008 01/13/14 0412  TROPONINI 0.54* 0.54* 0.47*    TELE NSR with HR 50-80s, no significant ventricular ectopy    ECG No new EKG   Echocardiogram 01/12/2014  LV EF: 30% - 35%  ------------------------------------------------------------------- Indications: CHF - 428.0.  ------------------------------------------------------------------- History: PMH: Atrial fibrillation. Coronary artery disease. Transient ischemic attack. Risk factors: Hypertension.  ------------------------------------------------------------------- Study Conclusions  - Left ventricle: There is global hypokinesis with akinesis of the basal and mid inferolateral walls. The cavity size was moderately dilated. There was moderate concentric hypertrophy. Systolic function was moderately to severely reduced. The estimated ejection fraction was in the range of 30% to 35%. Doppler parameters are consistent with a restrictive pattern, indicative of decreased left  ventricular diastolic compliance and/or increased left atrial pressure (grade 3 diastolic dysfunction). Doppler parameters are consistent with elevated ventricular end-diastolic filling pressure. - Aortic valve: Bioprosthetic valve seems to be functioning normally. Normal transaortic gradients. There is mild central regurgitation and no paravalvular leak. There was mild regurgitation directed eccentrically in the LVOT. Valve area (Vmax): 0.96 cm^2. - Aorta: The aorta was normal, not dilated, and non-diseased. Aortic root dimension: 40 mm (ED). - Mitral valve: There was moderate to severe regurgitation. - Left atrium: The atrium was moderately  dilated. - Right ventricle: Systolic function was moderately reduced. - Atrial septum: No defect or patent foramen ovale was identified. - Tricuspid valve: There was mild regurgitation.  Impressions:  - When compared to the prior study from 03/26/2013 the left ventricle is now moderately dilated with moderate to severe systolic dysfunction and estimated LVEF 30-35%, previously 40-45%. There is restrictive pattern of diastolic dysfunction with elevated filling pressures. Mitral regurgitation is moderate to severe, previosuly mild. Right ventricular systolic function is moderately reduced.      Radiology/Studies  Dg Chest Portable 1 View  01/12/2014   CLINICAL DATA:  Shortness of breath. Atrial fibrillation. Initial encounter  EXAM: PORTABLE CHEST - 1 VIEW  COMPARISON:  None.  FINDINGS: Diffuse interstitial and airspace opacity which is symmetric. Small pleural effusions are present. Background pulmonary hyperinflation. There is cardiomegaly. Previous median sternotomy for aortic valve replacement.  IMPRESSION: 1. CHF. 2. Probable background COPD. 3. Aortic valve replacement.   Electronically Signed   By: Tiburcio Pea M.D.   On: 01/12/2014 05:58    ASSESSMENT AND PLAN  1. Acute on chronic combined systolic and diastolic HF  - Last known EF was 40-45% in December 2014  - echo 01/12/2014 EF 30-35%, global hypokinesis with akinesis of basal and mid inferolateral wall, moderate LVH, grade 3 diastolic dysfunc, moderately reduced RV EF, moderate to severe MR, moderately dilated biatrial size.  - off lasix and ACEI yesterday, will obtain L&RHC either this afternoon or tomorrow, will check with cath lab regarding schedule, i have instructed patient not to eat this morning. Dr. Antoine Poche reviewed outside note, h/o severe LE PVD, will attempt cath via R radial and R antecubital access. Given the need for systemic anticoagulation after discharge, recommend BMS is needed.   2. Elevated troponin   - ?demand ischemia in the setting of a-fib with RVR and acute CHF vs coronary disease related  - however given worsening LV dysfunction, plan for L&RHC once HF symptom stable per Dr. Patty Sermons  - continue to hold coumadin, INR 1.9 this morning. Pharmacy to start heparin for bridge  3. A-fib with RVR  - spontaneously converted to NSR in ED after initiating IV diltiazem   4. H/o tissue aortic valve replacement  5. HTN  6. Hyperlipidemia  7. Hypokalemia: K 4.8 stable.   Ramond Dial PA-C Pager: [phone]   History and all data above reviewed.  Patient examined.  I agree with the findings as above.  He is now post cath.  No obstructive disease.    The patient exam reveals COR:RRR  ,  Lungs: Bilateral crackles,  Abd: Positive bowel sounds, no rebound no guarding, Ext No edema   .  All available labs, radiology testing, previous records reviewed. Agree with documented assessment and plan.   Cardiomyopathy without restrictive lung disease.  Check BNP.  Probably will need out patient PFTs.  He will need heparin bridge until INR is greater than 2. Stop Plavix.  He is SOB  and has crackles.  I will give Lasix tonight.   Fayrene Fearing Shriyans Kuenzi  5:13 PM  01/14/2014

## 2014-01-15 DIAGNOSIS — I5041 Acute combined systolic (congestive) and diastolic (congestive) heart failure: Principal | ICD-10-CM

## 2014-01-15 LAB — BASIC METABOLIC PANEL
ANION GAP: 16 — AB (ref 5–15)
BUN: 42 mg/dL — ABNORMAL HIGH (ref 6–23)
CALCIUM: 8.8 mg/dL (ref 8.4–10.5)
CO2: 22 meq/L (ref 19–32)
CREATININE: 1.5 mg/dL — AB (ref 0.50–1.35)
Chloride: 102 mEq/L (ref 96–112)
GFR calc Af Amer: 49 mL/min — ABNORMAL LOW (ref >90)
GFR calc non Af Amer: 42 mL/min — ABNORMAL LOW (ref >90)
Glucose, Bld: 83 mg/dL (ref 70–99)
Potassium: 4.6 mEq/L (ref 3.7–5.3)
Sodium: 140 mEq/L (ref 137–147)

## 2014-01-15 LAB — HEPARIN LEVEL (UNFRACTIONATED)
HEPARIN UNFRACTIONATED: 0.43 [IU]/mL (ref 0.30–0.70)
Heparin Unfractionated: 0.54 IU/mL (ref 0.30–0.70)

## 2014-01-15 LAB — GLUCOSE, CAPILLARY: GLUCOSE-CAPILLARY: 76 mg/dL (ref 70–99)

## 2014-01-15 LAB — PRO B NATRIURETIC PEPTIDE: Pro B Natriuretic peptide (BNP): 9432 pg/mL — ABNORMAL HIGH (ref 0–450)

## 2014-01-15 LAB — PROTIME-INR
INR: 1.79 — ABNORMAL HIGH (ref 0.00–1.49)
Prothrombin Time: 20.9 seconds — ABNORMAL HIGH (ref 11.6–15.2)

## 2014-01-15 MED ORDER — TRAZODONE HCL 50 MG PO TABS
50.0000 mg | ORAL_TABLET | Freq: Every day | ORAL | Status: DC
  Administered 2014-01-15: 50 mg via ORAL
  Filled 2014-01-15 (×2): qty 1

## 2014-01-15 MED ORDER — LOPERAMIDE HCL 2 MG PO CAPS
4.0000 mg | ORAL_CAPSULE | Freq: Once | ORAL | Status: AC
  Administered 2014-01-15: 4 mg via ORAL
  Filled 2014-01-15: qty 2

## 2014-01-15 MED ORDER — FUROSEMIDE 10 MG/ML IJ SOLN
40.0000 mg | Freq: Two times a day (BID) | INTRAMUSCULAR | Status: DC
  Administered 2014-01-15 – 2014-01-16 (×2): 40 mg via INTRAVENOUS
  Filled 2014-01-15 (×4): qty 4

## 2014-01-15 MED ORDER — WARFARIN SODIUM 4 MG PO TABS
4.0000 mg | ORAL_TABLET | Freq: Once | ORAL | Status: AC
  Administered 2014-01-15: 4 mg via ORAL
  Filled 2014-01-15: qty 1

## 2014-01-15 MED ORDER — ALPRAZOLAM 0.25 MG PO TABS
0.2500 mg | ORAL_TABLET | Freq: Three times a day (TID) | ORAL | Status: DC | PRN
  Administered 2014-01-15 (×2): 0.25 mg via ORAL
  Filled 2014-01-15 (×2): qty 1

## 2014-01-15 NOTE — Progress Notes (Addendum)
ANTICOAGULATION CONSULT NOTE - Initial Consult  Pharmacy Consult for heparin Indication: atrial fibrillation  Allergies  Allergen Reactions  . Ambien [Zolpidem] Other (See Comments)    Hallucinations  . Ativan [Lorazepam] Other (See Comments)    hallucinations  . Penicillins     Unknown childhood reaction   . Sulfa Antibiotics     unknown    Patient Measurements: Height: 5\' 11"  (180.3 cm) Weight: 174 lb 4.8 oz (79.062 kg) IBW/kg (Calculated) : 75.3 Heparin Dosing Weight:  Vital Signs: Temp: 97.4 F (36.3 C) (10/21 0546) Temp Source: Oral (10/21 0546) BP: 148/52 mmHg (10/21 0546) Pulse Rate: 83 (10/21 0546)  Labs:  Recent Labs  01/12/14 1343 01/12/14 2008  01/13/14 0412 01/13/14 1045 01/13/14 1837 01/14/14 0456 01/15/14 0349 01/15/14 0500  LABPROT  --   --   < > 25.5*  --  23.3* 22.0* 20.9*  --   INR  --   --   < > 2.30*  --  2.05* 1.90* 1.79*  --   HEPARINUNFRC  --   --   --   --   --   --   --   --  0.43  CREATININE  --   --   < > 1.79* 1.65*  --  1.65* 1.50*  --   TROPONINI 0.54* 0.54*  --  0.47*  --   --   --   --   --   < > = values in this interval not displayed.  Estimated Creatinine Clearance: 41.1 ml/min (by C-G formula based on Cr of 1.5).   Medical History: Past Medical History  Diagnosis Date  . Arthritis   . Coronary artery disease   . Hypertension   . Stroke     July 2000  January 2007  . Renal disorder     non acute  . GERD (gastroesophageal reflux disease)   . Foot pain, bilateral   . H/O hematuria   . Hyperlipidemia   . Vitamin D deficiency   . CHF (congestive heart failure)   . Dysrhythmia     HISTORY OF ATRIAL FIBRILATION  . Shortness of breath    Assessment: 80 yo male on warfarin PTA for AFib. Warfarin was held during this admission for L and R cath. INR 1.79 this morning, heparin level was therapeutic.  Pt is being bridged with heparin gtt due to recent AFib and hx of TIAs.  CBC is stable.  Coumadin PTA dose: 1 mg on  Sundays 4 mg on all other days.  Goal of Therapy:  INR 2-3 Monitor platelets by anticoagulation protocol: Yes    Plan:  -Warfarin 4 mg po x1 -Daily INR -Daily HL, CBC while on heparin gtt -Continue heparin at 1050 units/hr    Agapito Games, PharmD, BCPS Clinical Pharmacist Pager: [phone] 01/15/2014 8:48 AM     -Heparin gtt continues to be therapeutic, x2 -Continue current rate, 1050 units/hr  Baldemar Friday 01/15/2014 2:41 PM

## 2014-01-15 NOTE — Progress Notes (Signed)
Occupational Therapy Evaluation Patient Details Name: Timothy CanterburyRoy Creamer MRN: [id number] DOB: [birth year] Today's Date: 01/15/2014    History of Present Illness 80 y.o. male with PMH of HTN, CKD, PAD s/p stent (plavix), CVA with residual L sided weakness, s/p AVR, A fib (coumadin), CHF (not on diuretics x 6 month) presented progressive SOB, DOE, some PNDs for few days, associated with mild leg edema and found to have acute CHF, A fib RVR in ED. He was admitted to telemetry. Cardiology consulted. Underwent cardiac catheterization on 10/20.   Clinical Impression   PTA, pt independent with mobility and ADL. Lives in "retirement community" with wife who can provide 24/7 assistance. O2 sats 95-99 RA with functional mobility ambulating @75  ft. HR 90. Dyspnea 2/4. Required min guard due to unsteadiness apparently from being in bed; also pt had xanax this pm. Pt will be able to D/C home with wife when medically stable. PT will assess the patient's mobility  Further to assess need for any DME. Will discuss possible need for bathroom DME on next visit Discussed importance of fall reduction with pt/wife. Rec staff ambulate pt using RW tonight. Discussed with nursing.    Follow Up Recommendations  No OT follow up;Supervision/Assistance - 24 hour    Equipment Recommendations  Tub/shower seat    Recommendations for Other Services       Precautions / Restrictions Precautions Precautions: Fall      Mobility Bed Mobility Overal bed mobility: Independent                Transfers Overall transfer level: Needs assistance   Transfers: Sit to/from Stand;Stand Pivot Transfers Sit to Stand: Supervision Stand pivot transfers: Min guard            Balance Overall balance assessment: Needs assistance Sitting-balance support: Feet supported Sitting balance-Leahy Scale: Good     Standing balance support: During functional activity Standing balance-Leahy Scale: Fair                               ADL Overall ADL's : Needs assistance/impaired                                     Functional mobility during ADLs: Min guard General ADL Comments: overall S for ADL with the exception of minguard for toilet transfers     Vision                     Perception     Praxis      Pertinent Vitals/Pain Pain Assessment: No/denies pain     Hand Dominance Left   Extremity/Trunk Assessment Upper Extremity Assessment Upper Extremity Assessment: Overall WFL for tasks assessed (L residual weakness from CVA)   Lower Extremity Assessment Lower Extremity Assessment: Defer to PT evaluation   Cervical / Trunk Assessment Cervical / Trunk Assessment: Normal   Communication Communication Communication: No difficulties   Cognition Arousal/Alertness: Awake/alert Behavior During Therapy: WFL for tasks assessed/performed Overall Cognitive Status: Within Functional Limits for tasks assessed                     General Comments       Exercises       Shoulder Instructions      Home Living Family/patient expects to be discharged to:: Private residence Living Arrangements: Spouse/significant other Available Help at Discharge:  Family;Available 24 hours/day Type of Home: House (garden home/ retirement community) Home Access: Level entry     Home Layout: One level     Bathroom Shower/Tub: Producer, television/film/video: Pharmacist, community: No   Home Equipment: None          Prior Functioning/Environment Level of Independence: Independent        Comments: drives    OT Diagnosis: Generalized weakness   OT Problem List: Decreased strength;Decreased activity tolerance;Impaired balance (sitting and/or standing)   OT Treatment/Interventions: Self-care/ADL training;Energy conservation;DME and/or AE instruction;Patient/family education;Balance training    OT Goals(Current goals can be found in the care plan section)  Acute Rehab OT Goals Patient Stated Goal: to go home OT Goal Formulation: With patient Time For Goal Achievement: 01/29/14 Potential to Achieve Goals: Good  OT Frequency: Min 2X/week   Barriers to D/C:            Co-evaluation              End of Session Equipment Utilized During Treatment: Gait belt Nurse Communication: Mobility status  Activity Tolerance: Patient tolerated treatment well Patient left: in chair;with call bell/phone within reach;with family/visitor present   Time: 0076-2263 OT Time Calculation (min): 32 min Charges:  OT General Charges $OT Visit: 1 Procedure OT Evaluation $Initial OT Evaluation Tier I: 1 Procedure OT Treatments $Self Care/Home Management : 8-22 mins G-Codes:    Jene Oravec,HILLARY Feb 09, 2014, 4:54 PM   [facility], OTR/L  [phone] 2014/02/09

## 2014-01-15 NOTE — Progress Notes (Signed)
Patient Name: Timothy Durham Date of Encounter: 01/15/2014     Active Problems:   CHF exacerbation   Acute combined systolic and diastolic heart failure   Elevated troponin    SUBJECTIVE  Slept ok last night on bedside chair. Has not been getting good sleep since in the hospital. Wish to go home if no further workup. Denies any significant CP or SOB  CURRENT MEDS . allopurinol  200 mg Oral Daily  . atorvastatin  40 mg Oral QPM  . bismuth subsalicylate  30 mL Oral TID AC & HS  . cholecalciferol  1,000 Units Oral q morning - 10a  . metoprolol tartrate  25 mg Oral BID  . pantoprazole  40 mg Oral Daily  . warfarin  4 mg Oral ONCE-1800  . Warfarin - Pharmacist Dosing Inpatient   Does not apply q1800    OBJECTIVE  Filed Vitals:   01/14/14 1742 01/14/14 1942 01/15/14 0546 01/15/14 0929  BP: 117/91 126/59 148/52 124/37  Pulse: 68 65 83 67  Temp:  97.4 F (36.3 C) 97.4 F (36.3 C) 98.1 F (36.7 C)  TempSrc:  Oral Oral Axillary  Resp:  18 18 18   Height:      Weight:   174 lb 4.8 oz (79.062 kg)   SpO2: 93% 93% 96% 95%    Intake/Output Summary (Last 24 hours) at 01/15/14 0955 Last data filed at 01/15/14 0546  Gross per 24 hour  Intake 690.32 ml  Output   1450 ml  Net -759.68 ml   Filed Weights   01/13/14 0404 01/14/14 0500 01/15/14 0546  Weight: 175 lb 12.8 oz (79.742 kg) 176 lb 4.8 oz (79.969 kg) 174 lb 4.8 oz (79.062 kg)    PHYSICAL EXAM  General: Pleasant, NAD. Neuro: Alert and oriented X 3. Moves all extremities spontaneously. Psych: Normal affect.  HEENT:  Normal  Neck: Supple without bruits or JVD. Lungs:  Resp regular and unlabored, CTA. Heart: RRR no s3, s4, or murmurs. Abdomen: Soft, non-tender, non-distended, BS + x 4.  Extremities: No clubbing, cyanosis or edema. DP/PT/Radials 2+ and equal bilaterally.  Accessory Clinical Findings  Basic Metabolic Panel  Recent Labs  01/14/14 0456 01/15/14 0349  NA 138 140  K 4.8 4.6  CL 98 102  CO2 26 22   GLUCOSE 104* 83  BUN 38* 42*  CREATININE 1.65* 1.50*  CALCIUM 9.1 8.8   Cardiac Enzymes  Recent Labs  01/12/14 1343 01/12/14 2008 01/13/14 0412  TROPONINI 0.54* 0.54* 0.47*    TELE NSR with HR 50-80s, no recurrent a-fib    ECG  No new EKG  Echocardiogram 01/12/2014  - Left ventricle: There is global hypokinesis with akinesis of the basal and mid inferolateral walls. The cavity size was moderately dilated. There was moderate concentric hypertrophy. Systolic function was moderately to severely reduced. The estimated ejection fraction was in the range of 30% to 35%. Doppler parameters are consistent with a restrictive pattern, indicative of decreased left ventricular diastolic compliance and/or increased left atrial pressure (grade 3 diastolic dysfunction). Doppler parameters are consistent with elevated ventricular end-diastolic filling pressure. - Aortic valve: Bioprosthetic valve seems to be functioning normally. Normal transaortic gradients. There is mild central regurgitation and no paravalvular leak. There was mild regurgitation directed eccentrically in the LVOT. Valve area (Vmax): 0.96 cm^2. - Aorta: The aorta was normal, not dilated, and non-diseased. Aortic root dimension: 40 mm (ED). - Mitral valve: There was moderate to severe regurgitation. - Left atrium: The atrium was moderately  dilated. - Right ventricle: Systolic function was moderately reduced. - Atrial septum: No defect or patent foramen ovale was identified. - Tricuspid valve: There was mild regurgitation.  Impressions:  - When compared to the prior study from 03/26/2013 the left ventricle is now moderately dilated with moderate to severe systolic dysfunction and estimated LVEF 30-35%, previously 40-45%. There is restrictive pattern of diastolic dysfunction with elevated filling pressures. Mitral regurgitation is moderate to severe, previosuly mild. Right ventricular systolic function is  moderately reduced.      Radiology/Studies  Dg Chest Portable 1 View  01/12/2014   CLINICAL DATA:  Shortness of breath. Atrial fibrillation. Initial encounter  EXAM: PORTABLE CHEST - 1 VIEW  COMPARISON:  None.  FINDINGS: Diffuse interstitial and airspace opacity which is symmetric. Small pleural effusions are present. Background pulmonary hyperinflation. There is cardiomegaly. Previous median sternotomy for aortic valve replacement.  IMPRESSION: 1. CHF. 2. Probable background COPD. 3. Aortic valve replacement.   Electronically Signed   By: Tiburcio PeaJonathan  Watts M.D.   On: 01/12/2014 05:58    ASSESSMENT AND PLAN  1. Acute on chronic combined systolic and diastolic HF  - Last known EF was 40-45% in December 2014  - Echo 01/12/2014 EF 30-35%, global hypokinesis with akinesis of basal and mid inferolateral wall, moderate LVH, grade 3 diastolic dysfunc, moderately reduced RV EF, moderate to severe MR, moderately dilated biatrial size - L&RHC 01/14/2014 nonobstructive CAD, PA 42/23 mean 25. Wedge 17 - continue to hold ACEI given acute renal insufficiency, can restart on followup - received 1 dose of 40mg  IV lasix yesterday, currently -1L. proBNP this morning 9000, despite pulm exam clear, no LE edema. Unclear cause for elevated proBNP, will discuss with Dr. Antoine PocheHochrein. Consider resume home lasix 40mg  daily, Cr improving. Patient wish to go home  2. Elevated troponin  - demand ischemia in the setting of a-fib with RVR and acute CHF vs coronary disease related  - given worsening LV dysfunction, coumadin held, bridge with heparin and underwent L&R heart cath - restart coumadin today  3. A-fib with RVR  - spontaneously converted to NSR in ED after initiating IV diltiazem. On BB   4. H/o tissue aortic valve replacement  5. HTN  6. Hyperlipidemia  7. Hypokalemia   Signed, Azalee CourseMeng, Hao PA-C Pager: [phone]   History and all data above reviewed.  Patient examined.  I agree with the findings as above.  Breathing somewhat better.  He is anxious to go home.  The patient exam reveals COR:RRR  ,  Lungs: Left greater than right basilar crackles  ,  Abd: Positive bowel sounds, no rebound no guarding, Ext No edema   .  All available labs, radiology testing, previous records reviewed. Agree with documented assessment and plan. Dyspnea:  Probably some component of chronic lung disease but also with volume overload.  Needs IV Lasix again today.  Afib:  History of stroke/TIA off of anticoagulation.  He needs to have an INR of 2 before heparin is discontinued.  I discussed this with the patient and his wife.   Fayrene FearingJames Zyrion Coey  10:35 AM  01/15/2014

## 2014-01-15 NOTE — Progress Notes (Signed)
[facility] PROGRESS NOTE  Timothy CanterburyRoy Spagna ZOX:[id number] DOB: Sep 05, 1930 DOA: 01/12/2014 PCP: Dennis BastABEZA,YURI, MD  Interim summary: Timothy Durham is a 80 y.o. male with PMH of HTN, CKD, PAD s/p stent (plavix), CVA with residual L sided weakness, s/p AVR, A fib (coumadin), CHF (not on diuretics x 6 month) presented progressive SOB, DOE, some PNDs for few days, associated with mild leg edema and found to have acute CHF, A fib RVR in ED. He was admitted to telemetry. Cardiology consulted. Underwent cardiac catheterization on 10/20. Nonobstructive CAD noted.   Assessment/Plan:  Acute on chronic systolic heart failure: Cardiology managing diuresis. Patient appears to be stable. He wants to go home. Echocardiogram shows decline in the LVEF.   Afib with RVR: He is off Cardizem drip and on metoprolol. Continue to monitor. Rate controlled. On warfarin with IV heparin bridging.   PAD: Continue current meds.   History of CVA with left sided weakness: No new deficits.   Hypertension Better controlled.   CKD Stage 3 Stable. Monitor.  DVT Prophylaxis: On warfarin Code Status: full code.  Family Communication: discussed with patient and wife at bedside Disposition Plan: PT/OT to see. Await therapeutic INR.   Consultants: Cardiology  Procedures: 2D ECHO Study Conclusions - Left ventricle: There is global hypokinesis with akinesis of the basal and mid inferolateral walls. The cavity size was moderately dilated. There was moderate concentric hypertrophy. Systolic function was moderately to severely reduced. The estimated ejection fraction was in the range of 30% to 35%. Doppler parameters are consistent with a restrictive pattern, indicative of decreased left ventricular diastolic compliance and/or increased left atrial pressure (grade 3 diastolic dysfunction). Doppler parameters are consistent with elevated ventricular end-diastolic filling pressure. - Aortic valve: Bioprosthetic valve seems to  be functioning normally. Normal transaortic gradients. There is mild central regurgitation and no paravalvular leak. There was mild regurgitation directed eccentrically in the LVOT. Valve area (Vmax): 0.96 cm^2. - Aorta: The aorta was normal, not dilated, and non-diseased. Aortic root dimension: 40 mm (ED). - Mitral valve: There was moderate to severe regurgitation. - Left atrium: The atrium was moderately dilated. - Right ventricle: Systolic function was moderately reduced. - Atrial septum: No defect or patent foramen ovale was identified. - Tricuspid valve: There was mild regurgitation.  Impressions: - When compared to the prior study from 03/26/2013 the left ventricle is now moderately dilated with moderate to severe systolic dysfunction and estimated LVEF 30-35%, previously 40-45%. There is restrictive pattern of diastolic dysfunction with elevated filling pressures. Mitral regurgitation is moderate to severe, previosuly mild. Right ventricular systolic function is moderately reduced.  Cardiac Catheterization 10/20 Final Conclusions:  1. Nonobstructive CAD  2. Mild pulmonary HTN   Antibiotics:  none  Subjective: Patient feels well. Wants to go home. Denies sob, chest pain.  Objective: Filed Vitals:   01/15/14 0929  BP: 124/37  Pulse: 67  Temp: 98.1 F (36.7 C)  Resp: 18    Intake/Output Summary (Last 24 hours) at 01/15/14 1105 Last data filed at 01/15/14 0546  Gross per 24 hour  Intake 669.32 ml  Output   1450 ml  Net -780.68 ml   Filed Weights   01/13/14 0404 01/14/14 0500 01/15/14 0546  Weight: 79.742 kg (175 lb 12.8 oz) 79.969 kg (176 lb 4.8 oz) 79.062 kg (174 lb 4.8 oz)    Exam:   General:  Alert afebrile comfortable  Cardiovascular: s1s2  Respiratory: decreased air entry at bases. No wheezing.  Abdomen: soft NT nd bs+  Musculoskeletal: no  pedal edema.   Data Reviewed: Basic Metabolic Panel:  Recent Labs Lab 01/12/14 0538 01/13/14 0412  01/13/14 1045 01/13/14 1400 01/14/14 0456 01/15/14 0349  NA 142 141 139  --  138 140  K 3.4* 5.8* 4.8 5.1 4.8 4.6  CL 102 103 102  --  98 102  CO2 23 25 22   --  26 22  GLUCOSE 121* 108* 109*  --  104* 83  BUN 21 33* 33*  --  38* 42*  CREATININE 1.21 1.79* 1.65*  --  1.65* 1.50*  CALCIUM 8.9 9.2 9.1  --  9.1 8.8   CBC:  Recent Labs Lab 01/12/14 0538  WBC 8.2  NEUTROABS 5.7  HGB 14.9  HCT 42.5  MCV 95.5  PLT 228   Cardiac Enzymes:  Recent Labs Lab 01/12/14 0538 01/12/14 0845 01/12/14 1343 01/12/14 2008 01/13/14 0412  TROPONINI <0.30 0.30* 0.54* 0.54* 0.47*   BNP (last 3 results)  Recent Labs  01/12/14 0539 01/15/14 0349  PROBNP 4853.0* 9432.0*   CBG:  Recent Labs Lab 01/15/14 0632  GLUCAP 76    Scheduled Meds: . allopurinol  200 mg Oral Daily  . atorvastatin  40 mg Oral QPM  . bismuth subsalicylate  30 mL Oral TID AC & HS  . cholecalciferol  1,000 Units Oral q morning - 10a  . furosemide  40 mg Intravenous BID  . loperamide  4 mg Oral Once  . metoprolol tartrate  25 mg Oral BID  . pantoprazole  40 mg Oral Daily  . traZODone  50 mg Oral QHS  . warfarin  4 mg Oral ONCE-1800  . Warfarin - Pharmacist Dosing Inpatient   Does not apply q1800   Continuous Infusions: . heparin 1,050 Units/hr (01/14/14 1846)    Active Problems:   CHF exacerbation   Acute combined systolic and diastolic heart failure   Elevated troponin   Time spent: 35 minutes     [facility]  [facility] Pager [phone].   If 7PM-7AM, please contact night-coverage at [email or web address], password [facility] 01/15/2014, 11:05 AM  LOS: 3 days

## 2014-01-15 NOTE — Progress Notes (Signed)
Pt refuses CPAP at this time. RT available if needed.

## 2014-01-16 LAB — BASIC METABOLIC PANEL
Anion gap: 14 (ref 5–15)
BUN: 44 mg/dL — ABNORMAL HIGH (ref 6–23)
CALCIUM: 8.9 mg/dL (ref 8.4–10.5)
CO2: 24 mEq/L (ref 19–32)
CREATININE: 1.53 mg/dL — AB (ref 0.50–1.35)
Chloride: 103 mEq/L (ref 96–112)
GFR calc Af Amer: 47 mL/min — ABNORMAL LOW (ref >90)
GFR calc non Af Amer: 41 mL/min — ABNORMAL LOW (ref >90)
GLUCOSE: 84 mg/dL (ref 70–99)
Potassium: 3.9 mEq/L (ref 3.7–5.3)
SODIUM: 141 meq/L (ref 137–147)

## 2014-01-16 LAB — CBC
HCT: 40 % (ref 39.0–52.0)
Hemoglobin: 13.6 g/dL (ref 13.0–17.0)
MCH: 32.5 pg (ref 26.0–34.0)
MCHC: 34 g/dL (ref 30.0–36.0)
MCV: 95.5 fL (ref 78.0–100.0)
PLATELETS: 185 10*3/uL (ref 150–400)
RBC: 4.19 MIL/uL — ABNORMAL LOW (ref 4.22–5.81)
RDW: 14.2 % (ref 11.5–15.5)
WBC: 7.7 10*3/uL (ref 4.0–10.5)

## 2014-01-16 LAB — HEPARIN LEVEL (UNFRACTIONATED): Heparin Unfractionated: 0.74 IU/mL — ABNORMAL HIGH (ref 0.30–0.70)

## 2014-01-16 LAB — PROTIME-INR
INR: 2.13 — AB (ref 0.00–1.49)
Prothrombin Time: 24 seconds — ABNORMAL HIGH (ref 11.6–15.2)

## 2014-01-16 MED ORDER — TRAZODONE HCL 50 MG PO TABS: 50.0000 mg | ORAL_TABLET | Freq: Every evening | ORAL | Status: DC | PRN

## 2014-01-16 MED ORDER — FUROSEMIDE 40 MG PO TABS: 40.0000 mg | ORAL_TABLET | Freq: Two times a day (BID) | ORAL | Status: DC

## 2014-01-16 MED ORDER — WARFARIN SODIUM 4 MG PO TABS
4.0000 mg | ORAL_TABLET | Freq: Once | ORAL | Status: DC
  Filled 2014-01-16: qty 1

## 2014-01-16 MED ORDER — CLOPIDOGREL BISULFATE 75 MG PO TABS
75.0000 mg | ORAL_TABLET | Freq: Every day | ORAL | Status: DC
  Administered 2014-01-16: 75 mg via ORAL
  Filled 2014-01-16: qty 1

## 2014-01-16 MED ORDER — METOPROLOL TARTRATE 25 MG PO TABS: 25.0000 mg | ORAL_TABLET | Freq: Two times a day (BID) | ORAL | Status: DC

## 2014-01-16 NOTE — Progress Notes (Signed)
ANTICOAGULATION CONSULT NOTE - Follow Up Consult  Pharmacy Consult for coumadin Indication: atrial fibrillation  Allergies  Allergen Reactions  . Ambien [Zolpidem] Other (See Comments)    Hallucinations  . Ativan [Lorazepam] Other (See Comments)    hallucinations  . Penicillins     Unknown childhood reaction   . Sulfa Antibiotics     unknown    Patient Measurements: Height: 5\' 11"  (180.3 cm) Weight: 173 lb 8 oz (78.7 kg) IBW/kg (Calculated) : 75.3 Heparin Dosing Weight:   Vital Signs: Temp: 98.1 F (36.7 C) (10/22 0452) Temp Source: Oral (10/22 0452) BP: 117/49 mmHg (10/22 0452) Pulse Rate: 60 (10/22 0452)  Labs:  Recent Labs  01/14/14 0456 01/15/14 0349 01/15/14 0500 01/15/14 1348 01/16/14 0500  HGB  --   --   --   --  13.6  HCT  --   --   --   --  40.0  PLT  --   --   --   --  185  LABPROT 22.0* 20.9*  --   --  24.0*  INR 1.90* 1.79*  --   --  2.13*  HEPARINUNFRC  --   --  0.43 0.54 0.74*  CREATININE 1.65* 1.50*  --   --  1.53*    Estimated Creatinine Clearance: 40.3 ml/min (by C-G formula based on Cr of 1.53).   Medications:    Assessment: Heparin discontinued earlier this am  INR therapeutic Goal of Therapy:  INR 2-3 Monitor platelets by anticoagulation protocol: Yes   Plan:  Coumadin 4 mg today  Timothy Durham 01/16/2014,9:55 AM

## 2014-01-16 NOTE — Evaluation (Signed)
Physical Therapy Evaluation Patient Details Name: Timothy Durham MRN: [id number] DOB: 1931-03-13 Today's Date: 01/16/2014   History of Present Illness  80 y.o. male with PMH of HTN, CKD, PAD s/p stent (plavix), CVA with residual L sided weakness, s/p AVR, A fib (coumadin), CHF (not on diuretics x 6 month) presented progressive SOB, DOE, some PNDs for few days, associated with mild leg edema and found to have acute CHF, A fib RVR in ED. He was admitted to telemetry. Cardiology consulted. Underwent cardiac catheterization on 10/20.  Clinical Impression  Pt admitted with above. Pt currently without significant functional limitations.Pt will not need skilled PT at this time.  Wife may get pulse oximeter to monitor pts O2 sats as he was 90-92% today.  Feel that is probably pts baseline.  Wife aware of parameters and when to call MD.       Follow Up Recommendations No PT follow up    Equipment Recommendations  Other (comment) (pulse oximeter)    Recommendations for Other Services       Precautions / Restrictions Precautions Precautions: Fall Restrictions Weight Bearing Restrictions: No      Mobility  Bed Mobility Overal bed mobility: Independent                Transfers Overall transfer level: Independent                  Ambulation/Gait Ambulation/Gait assistance: Independent Ambulation Distance (Feet): 400 Feet Assistive device: None Gait Pattern/deviations: WFL(Within Functional Limits)   Gait velocity interpretation: at or above normal speed for age/gender General Gait Details: No LOB with challengesDOE 2/4 with ambulation.    Stairs            Wheelchair Mobility    Modified Rankin (Stroke Patients Only)       Balance           Standing balance support: No upper extremity supported;During functional activity Standing balance-Leahy Scale: Good                   Standardized Balance Assessment Standardized Balance Assessment :  Dynamic Gait Index   Dynamic Gait Index Level Surface: Normal Change in Gait Speed: Normal Gait with Horizontal Head Turns: Normal Gait with Vertical Head Turns: Normal Gait and Pivot Turn: Normal Step Over Obstacle: Normal Step Around Obstacles: Normal Steps: Mild Impairment Total Score: 23       Pertinent Vitals/Pain Pain Assessment: No/denies pain Sats 90-92% with ambulation on RA.  Pt may be close to baseline as he is a previous smoker.      Home Living Family/patient expects to be discharged to:: Private residence Living Arrangements: Spouse/significant other Available Help at Discharge: Family;Available 24 hours/day Type of Home: House (garden home/ retirement community) Home Access: Level entry     Home Layout: One level Home Equipment: None      Prior Function Level of Independence: Independent         Comments: drives     Hand Dominance   Dominant Hand: Left    Extremity/Trunk Assessment   Upper Extremity Assessment: Defer to OT evaluation           Lower Extremity Assessment: Overall WFL for tasks assessed      Cervical / Trunk Assessment: Normal  Communication   Communication: No difficulties  Cognition Arousal/Alertness: Awake/alert Behavior During Therapy: WFL for tasks assessed/performed Overall Cognitive Status: Within Functional Limits for tasks assessed  General Comments General comments (skin integrity, edema, etc.): Scored 23/24 on DGI predicting low risk of falls.      Exercises        Assessment/Plan    PT Assessment Patent does not need any further PT services  PT Diagnosis Generalized weakness   PT Problem List    PT Treatment Interventions     PT Goals (Current goals can be found in the Care Plan section) Acute Rehab PT Goals PT Goal Formulation: All assessment and education complete, DC therapy    Frequency     Barriers to discharge        Co-evaluation                End of Session Equipment Utilized During Treatment: Gait belt Activity Tolerance: Patient limited by fatigue Patient left: in chair;with call bell/phone within reach;with family/visitor present Nurse Communication: Mobility status         Time: 1015-1040 PT Time Calculation (min): 25 min   Charges:   PT Evaluation $Initial PT Evaluation Tier I: 1 Procedure PT Treatments $Gait Training: 8-22 mins   PT G CodesBerline Lopes 09-Feb-2014, 1:04 PM [employer] Acute Rehabilitation [phone] (pager)

## 2014-01-16 NOTE — Progress Notes (Signed)
DC IV and tele per MD order and protocol; DC instructions reviewed with pt and wife at bedside; no further questions from pt or wife.  Hermina Barters, RN

## 2014-01-16 NOTE — Progress Notes (Signed)
ANTICOAGULATION CONSULT NOTE - Follow Up Consult  Pharmacy Consult for heparin Indication: atrial fibrillation  Allergies  Allergen Reactions  . Ambien [Zolpidem] Other (See Comments)    Hallucinations  . Ativan [Lorazepam] Other (See Comments)    hallucinations  . Penicillins     Unknown childhood reaction   . Sulfa Antibiotics     unknown    Patient Measurements: Height: 5\' 11"  (180.3 cm) Weight: 174 lb 4.8 oz (79.062 kg) IBW/kg (Calculated) : 75.3 Heparin Dosing Weight:   Vital Signs: Temp: 98.1 F (36.7 C) (10/22 0452) Temp Source: Oral (10/22 0452) BP: 117/49 mmHg (10/22 0452) Pulse Rate: 60 (10/22 0452)  Labs:  Recent Labs  01/13/14 1045  01/14/14 0456 01/15/14 0349 01/15/14 0500 01/15/14 1348 01/16/14 0500  HGB  --   --   --   --   --   --  13.6  HCT  --   --   --   --   --   --  40.0  PLT  --   --   --   --   --   --  185  LABPROT  --   < > 22.0* 20.9*  --   --  24.0*  INR  --   < > 1.90* 1.79*  --   --  2.13*  HEPARINUNFRC  --   --   --   --  0.43 0.54 0.74*  CREATININE 1.65*  --  1.65* 1.50*  --   --   --   < > = values in this interval not displayed.  Estimated Creatinine Clearance: 41.1 ml/min (by C-G formula based on Cr of 1.5).   Medications:    Assessment: Heparin supratherapeutic at 0.74 inr therapeutic this am at 2.13 Goal of Therapy:  INR 2-3 Monitor platelets by anticoagulation protocol: Yes   Plan:  Hold heparin   Janice Coffin 01/16/2014,6:10 AM

## 2014-01-16 NOTE — Progress Notes (Signed)
SUBJECTIVE:  No pain.  Breathing is much better.     PHYSICAL EXAM Filed Vitals:   01/15/14 1805 01/15/14 1946 01/15/14 2030 01/16/14 0452  BP: 139/40 132/46  117/49  Pulse: 71 76  60  Temp:  97.4 F (36.3 C)  98.1 F (36.7 C)  TempSrc:  Oral  Oral  Resp: 18 17  18   Height:      Weight:    173 lb 8 oz (78.7 kg)  SpO2:  96% 95% 93%   General:  No distress Lungs:  Clear Heart:  RRR Abdomen:  Positive bowel sounds, no rebound no guarding Extremities:  No edema   LABS:  Results for orders placed during the hospital encounter of 01/12/14 (from the past 24 hour(s))  HEPARIN LEVEL (UNFRACTIONATED)     Status: None   Collection Time    01/15/14  1:48 PM      Result Value Ref Range   Heparin Unfractionated 0.54  0.30 - 0.70 IU/mL  PROTIME-INR     Status: Abnormal   Collection Time    01/16/14  5:00 AM      Result Value Ref Range   Prothrombin Time 24.0 (*) 11.6 - 15.2 seconds   INR 2.13 (*) 0.00 - 1.49  BASIC METABOLIC PANEL     Status: Abnormal   Collection Time    01/16/14  5:00 AM      Result Value Ref Range   Sodium 141  137 - 147 mEq/L   Potassium 3.9  3.7 - 5.3 mEq/L   Chloride 103  96 - 112 mEq/L   CO2 24  19 - 32 mEq/L   Glucose, Bld 84  70 - 99 mg/dL   BUN 44 (*) 6 - 23 mg/dL   Creatinine, Ser 7.89 (*) 0.50 - 1.35 mg/dL   Calcium 8.9  8.4 - 38.1 mg/dL   GFR calc non Af Amer 41 (*) >90 mL/min   GFR calc Af Amer 47 (*) >90 mL/min   Anion gap 14  5 - 15  CBC     Status: Abnormal   Collection Time    01/16/14  5:00 AM      Result Value Ref Range   WBC 7.7  4.0 - 10.5 K/uL   RBC 4.19 (*) 4.22 - 5.81 MIL/uL   Hemoglobin 13.6  13.0 - 17.0 g/dL   HCT 01.7  51.0 - 25.8 %   MCV 95.5  78.0 - 100.0 fL   MCH 32.5  26.0 - 34.0 pg   MCHC 34.0  30.0 - 36.0 g/dL   RDW 52.7  78.2 - 42.3 %   Platelets 185  150 - 400 K/uL  HEPARIN LEVEL (UNFRACTIONATED)     Status: Abnormal   Collection Time    01/16/14  5:00 AM      Result Value Ref Range   Heparin  Unfractionated 0.74 (*) 0.30 - 0.70 IU/mL    Intake/Output Summary (Last 24 hours) at 01/16/14 5361 Last data filed at 01/16/14 4431  Gross per 24 hour  Intake    600 ml  Output   1700 ml  Net  -1100 ml    ASSESSMENT AND PLAN:  ACUTE ON CHRONIC CHF:   Good UO.  Weight is down about 7 lbs since admission.  I think the meds as listed are OK for discharge including Lasix 40 mg bid.  He is to get a BMET by his primary MD early next week (please include this in  discharge instructions).   CKD:  Creat is stable.    ATRIAL FIB:    Heparin stopped and discharged on warfarin.   HISTORY OF CVA:  Given the history of recurrent stroke and the fact that he was on Plavix and warfarin in the past I restarted the Plavix.    Timothy Durham 01/16/2014 9:24 AM

## 2014-01-16 NOTE — Discharge Summary (Signed)
[facility]  Physician Discharge Summary   Patient ID: Timothy Durham MRN: [id number] DOB/AGE: [birth year] 78 y.o.  Admit date: 01/12/2014 Discharge date: 01/16/2014  PCP: Dennis BastABEZA,YURI, MD  DISCHARGE DIAGNOSES:  Active Problems:   CHF exacerbation   Acute combined systolic and diastolic heart failure   Elevated troponin   RECOMMENDATIONS FOR OUTPATIENT FOLLOW UP: 1. Bmet and INR on 10/26 2. Holding ACEI for now. Please resume at lower dose if renal function is not worse.  DISCHARGE CONDITION: fair  Diet recommendation: Heart healthy  Filed Weights   01/14/14 0500 01/15/14 0546 01/16/14 0452  Weight: 79.969 kg (176 lb 4.8 oz) 79.062 kg (174 lb 4.8 oz) 78.7 kg (173 lb 8 oz)    INITIAL HISTORY: Timothy Durham is a 80 y.o. male with PMH of HTN, CKD, PAD s/p stent (plavix), CVA with residual L sided weakness, s/p AVR, A fib (coumadin), CHF (not on diuretics x 6 month) presented progressive SOB, DOE, some PNDs for few days, associated with mild leg edema and found to have acute CHF, A fib RVR in ED. He was admitted to telemetry. Cardiology was consulted. He underwent cardiac catheterization on 10/20 which revealed nonobstructive CAD.   Consultations:  Cardiology  Procedures: 2D ECHO  Study Conclusions - Left ventricle: There is global hypokinesis with akinesis of the basal and mid inferolateral walls. The cavity size was moderately dilated. There was moderate concentric hypertrophy. Systolic function was moderately to severely reduced. The estimated ejection fraction was in the range of 30% to 35%. Doppler parameters are consistent with a restrictive pattern, indicative of decreased left ventricular diastolic compliance and/or increased left atrial pressure (grade 3 diastolic dysfunction). Doppler parameters are consistent with elevated ventricular end-diastolic filling pressure. - Aortic valve: Bioprosthetic valve seems to be functioning normally. Normal transaortic gradients.  There is mild central regurgitation and no paravalvular leak. There was mild regurgitation directed eccentrically in the LVOT. Valve area (Vmax): 0.96 cm^2. - Aorta: The aorta was normal, not dilated, and non-diseased. Aortic root dimension: 40 mm (ED). - Mitral valve: There was moderate to severe regurgitation. - Left atrium: The atrium was moderately dilated. - Right ventricle: Systolic function was moderately reduced. - Atrial septum: No defect or patent foramen ovale was identified. - Tricuspid valve: There was mild regurgitation.  Impressions: - When compared to the prior study from 03/26/2013 the left ventricle is now moderately dilated with moderate to severe systolic dysfunction and estimated LVEF 30-35%, previously 40-45%. There is restrictive pattern of diastolic dysfunction with elevated filling pressures. Mitral regurgitation is moderate to severe, previosuly mild. Right ventricular systolic function is moderately reduced.  Cardiac Catheterization 10/20  Final Conclusions:  1. Nonobstructive CAD  2. Mild pulmonary HTN  HOSPITAL COURSE:   Acute on chronic systolic heart failure:  Patient was started on IV lasix. Some dietary indiscretion could have contributed as well. Cardiology was consulted for assistance with management. He diuresed well (7 lbs) and is back to baseline now. He will be ambulated today. He is saturating well on RA. Echocardiogram shows decline in the LVEF as above. He underwent cardiac catheterization for minimally elevated troponins, results as above. He will be discharged on oral lasix. Hold ACEI due to higher than baseline creatinine. Will need Bmet as OP and to decide at that pojnt regarding reinitiating ACEI.   Afib with RVR:  He was initially placed on cardizem infusion. He was taken off of it. He was placed on beta blocker. HR is well controlled. His warfarin was resumed with  IV heparin bridging. INR is therapeutic.  PAD  Stable. Continue current meds.    History of CVA with left sided weakness:  No new deficits. Continue home medications.  Essential Hypertension  BP is better controlled. Continue medications as prescribed. Stop Amlodipine as he is now on Metoprolol.  CKD Stage 3  Creatinine is higher than baseline possibly due to diuresis. Now back on oral lasix. Will need close follow up as OP. Holding ACEI for now.   Overall he is stable. Slept well last night. Requesting Trazodone. He is stable for discharge.   PERTINENT LABS:  The results of significant diagnostics from this hospitalization (including imaging, microbiology, ancillary and laboratory) are listed below for reference.     Labs: Basic Metabolic Panel:  Recent Labs Lab 01/13/14 0412 01/13/14 1045 01/13/14 1400 01/14/14 0456 01/15/14 0349 01/16/14 0500  NA 141 139  --  138 140 141  K 5.8* 4.8 5.1 4.8 4.6 3.9  CL 103 102  --  98 102 103  CO2 25 22  --  26 22 24   GLUCOSE 108* 109*  --  104* 83 84  BUN 33* 33*  --  38* 42* 44*  CREATININE 1.79* 1.65*  --  1.65* 1.50* 1.53*  CALCIUM 9.2 9.1  --  9.1 8.8 8.9   CBC:  Recent Labs Lab 01/12/14 0538 01/16/14 0500  WBC 8.2 7.7  NEUTROABS 5.7  --   HGB 14.9 13.6  HCT 42.5 40.0  MCV 95.5 95.5  PLT 228 185   Cardiac Enzymes:  Recent Labs Lab 01/12/14 0538 01/12/14 0845 01/12/14 1343 01/12/14 2008 01/13/14 0412  TROPONINI <0.30 0.30* 0.54* 0.54* 0.47*   BNP: BNP (last 3 results)  Recent Labs  01/12/14 0539 01/15/14 0349  PROBNP 4853.0* 9432.0*   CBG:  Recent Labs Lab 01/15/14 0632  GLUCAP 76     IMAGING STUDIES Dg Chest Portable 1 View  01/12/2014   CLINICAL DATA:  Shortness of breath. Atrial fibrillation. Initial encounter  EXAM: PORTABLE CHEST - 1 VIEW  COMPARISON:  None.  FINDINGS: Diffuse interstitial and airspace opacity which is symmetric. Small pleural effusions are present. Background pulmonary hyperinflation. There is cardiomegaly. Previous median sternotomy for aortic  valve replacement.  IMPRESSION: 1. CHF. 2. Probable background COPD. 3. Aortic valve replacement.   Electronically Signed   By: Tiburcio Pea M.D.   On: 01/12/2014 05:58    DISCHARGE EXAMINATION: Filed Vitals:   01/15/14 1805 01/15/14 1946 01/15/14 2030 01/16/14 0452  BP: 139/40 132/46  117/49  Pulse: 71 76  60  Temp:  97.4 F (36.3 C)  98.1 F (36.7 C)  TempSrc:  Oral  Oral  Resp: 18 17  18   Height:      Weight:    78.7 kg (173 lb 8 oz)  SpO2:  96% 95% 93%   General appearance: alert, cooperative, appears stated age and no distress Resp: much improved air entry bilaterally. Few crackles at bases. Cardio: regular rate and rhythm, S1, S2 normal, no murmur, click, rub or gallop  DISPOSITION: Home with wife  Discharge Instructions   (HEART FAILURE PATIENTS) Call MD:  Anytime you have any of the following symptoms: 1) 3 pound weight gain in 24 hours or 5 pounds in 1 week 2) shortness of breath, with or without a dry hacking cough 3) swelling in the hands, feet or stomach 4) if you have to sleep on extra pillows at night in order to breathe.    Complete by:  As  directed      Call MD for:  difficulty breathing, headache or visual disturbances    Complete by:  As directed      Call MD for:  persistant nausea and vomiting    Complete by:  As directed      Call MD for:  severe uncontrolled pain    Complete by:  As directed      Diet - low sodium heart healthy    Complete by:  As directed      Discharge instructions    Complete by:  As directed   PT/INR and BMET on Monday, 10/26.     Increase activity slowly    Complete by:  As directed            ALLERGIES:  Allergies  Allergen Reactions  . Ambien [Zolpidem] Other (See Comments)    Hallucinations  . Ativan [Lorazepam] Other (See Comments)    hallucinations  . Penicillins     Unknown childhood reaction   . Sulfa Antibiotics     unknown    Current Discharge Medication List    START taking these medications   Details   metoprolol tartrate (LOPRESSOR) 25 MG tablet Take 1 tablet (25 mg total) by mouth 2 (two) times daily. Qty: 60 tablet, Refills: 3    traZODone (DESYREL) 50 MG tablet Take 1 tablet (50 mg total) by mouth at bedtime as needed for sleep. Qty: 15 tablet, Refills: 0      CONTINUE these medications which have CHANGED   Details  furosemide (LASIX) 40 MG tablet Take 1 tablet (40 mg total) by mouth 2 (two) times daily. Qty: 60 tablet, Refills: 3      CONTINUE these medications which have NOT CHANGED   Details  acetaminophen (TYLENOL) 500 MG tablet Take 1,000 mg by mouth every 6 (six) hours as needed for mild pain or headache.     alendronate (FOSAMAX) 70 MG tablet Take 70 mg by mouth once a week. sunday    allopurinol (ZYLOPRIM) 100 MG tablet Take 200 mg by mouth daily.    atorvastatin (LIPITOR) 40 MG tablet Take 40 mg by mouth every evening.    Calcium Carb-Cholecalciferol (CALCIUM 600 + D PO) Take 2 tablets by mouth every morning.    cholecalciferol (VITAMIN D) 1000 UNITS tablet Take 1,000 Units by mouth every morning.    clopidogrel (PLAVIX) 75 MG tablet Take 75 mg by mouth daily.    Omega-3 Fatty Acids (FISH OIL) 1200 MG CAPS Take 1 capsule by mouth every morning.    omeprazole (PRILOSEC) 40 MG capsule Take 40 mg by mouth every morning.    !! warfarin (COUMADIN) 1 MG tablet Take 1 mg by mouth once a week. On Sunday    !! warfarin (COUMADIN) 4 MG tablet Take 4 mg by mouth See admin instructions. Monday-Saturday     !! - Potential duplicate medications found. Please discuss with provider.    STOP taking these medications     amLODipine (NORVASC) 2.5 MG tablet      quinapril (ACCUPRIL) 40 MG tablet        Follow-up Information   Follow up with CABEZA,YURI, MD In 4 days. (post hospitalization follow up and blood work (PT/INR and [employer]))    Specialty:  Internal Medicine   Contact information:   [employer] [address] [place] [address] [phone]       TOTAL  DISCHARGE TIME: 35 mins  [facility]  [facility] Pager [phone]  01/16/2014, 10:48 AM

## 2014-01-20 LAB — PROTIME-INR: INR: 3.1 — AB (ref 0.9–1.1)

## 2014-01-21 ENCOUNTER — Encounter: Payer: Self-pay | Admitting: Nurse Practitioner

## 2014-01-21 ENCOUNTER — Telehealth: Payer: Self-pay | Admitting: Nurse Practitioner

## 2014-01-21 ENCOUNTER — Other Ambulatory Visit: Payer: Self-pay | Admitting: *Deleted

## 2014-01-21 ENCOUNTER — Ambulatory Visit (INDEPENDENT_AMBULATORY_CARE_PROVIDER_SITE_OTHER): Payer: Medicare HMO | Admitting: Nurse Practitioner

## 2014-01-21 VITALS — BP 118/50 | HR 61 | Ht 71.0 in | Wt 172.0 lb

## 2014-01-21 DIAGNOSIS — Z87891 Personal history of nicotine dependence: Secondary | ICD-10-CM | POA: Insufficient documentation

## 2014-01-21 DIAGNOSIS — I429 Cardiomyopathy, unspecified: Secondary | ICD-10-CM

## 2014-01-21 DIAGNOSIS — I480 Paroxysmal atrial fibrillation: Secondary | ICD-10-CM

## 2014-01-21 DIAGNOSIS — E785 Hyperlipidemia, unspecified: Secondary | ICD-10-CM | POA: Insufficient documentation

## 2014-01-21 DIAGNOSIS — Z952 Presence of prosthetic heart valve: Secondary | ICD-10-CM

## 2014-01-21 DIAGNOSIS — I779 Disorder of arteries and arterioles, unspecified: Secondary | ICD-10-CM | POA: Insufficient documentation

## 2014-01-21 DIAGNOSIS — I739 Peripheral vascular disease, unspecified: Secondary | ICD-10-CM | POA: Insufficient documentation

## 2014-01-21 DIAGNOSIS — I428 Other cardiomyopathies: Secondary | ICD-10-CM | POA: Insufficient documentation

## 2014-01-21 DIAGNOSIS — Z954 Presence of other heart-valve replacement: Secondary | ICD-10-CM

## 2014-01-21 DIAGNOSIS — I5042 Chronic combined systolic (congestive) and diastolic (congestive) heart failure: Secondary | ICD-10-CM

## 2014-01-21 DIAGNOSIS — I340 Nonrheumatic mitral (valve) insufficiency: Secondary | ICD-10-CM

## 2014-01-21 DIAGNOSIS — I5043 Acute on chronic combined systolic (congestive) and diastolic (congestive) heart failure: Secondary | ICD-10-CM | POA: Insufficient documentation

## 2014-01-21 DIAGNOSIS — I10 Essential (primary) hypertension: Secondary | ICD-10-CM

## 2014-01-21 MED ORDER — FUROSEMIDE 40 MG PO TABS: 40.0000 mg | ORAL_TABLET | Freq: Every day | ORAL | Status: DC

## 2014-01-21 NOTE — Patient Instructions (Signed)
Your physician has recommended you make the following change in your medication:     START  TAKIING LASIX 40 MG ONCE A DAY   Your physician recommends that you schedule a follow-up appointment in:  WITH DR HOCHREIN IN 4 TO 6 WEEKS

## 2014-01-21 NOTE — Progress Notes (Signed)
Patient Name: Janece CanterburyRoy Jodoin Date of Encounter: 01/21/2014  Primary Care Provider:  Dennis BastABEZA,YURI, MD Primary Cardiologist:  J. Hochrein, MD   Patient Profile  80 y/o male with a h/o PAF, bioprosthetic AVR, and stroke who presents for f/u today after recent hospitalization r/t AF RVR, systolic CHF, and elevated troponin.  Problem List   Past Medical History  Diagnosis Date  . Arthritis   . Non-obstructive CAD     a. 12/2013 NSTEMI/Cath: LM nl, LAD 20, LCX 40-50, RCA 20.  Marland Kitchen. Hypertension   . Stroke     a. July 2000;  b. January 2007;  c. TIA in 2014.  . CKD (chronic kidney disease), stage III   . GERD (gastroesophageal reflux disease)   . Foot pain, bilateral   . H/O hematuria   . Hyperlipidemia   . Vitamin D deficiency   . Chronic combined systolic and diastolic CHF (congestive heart failure)     a. 12/2013 Echo: EF 30-35%, mod conc LVH, Gr 3 DD, mild AI, mod-sev MR, mod dil LA, mild TR.  Marland Kitchen. PAF (paroxysmal atrial fibrillation)     a. CHA2DS2VASc = 7--> Chronic Coumadin.  Marland Kitchen. NICM (nonischemic cardiomyopathy)     a. 12/2013 Echo: EF 30-35%.  . Aortic valve prosthesis present     a. 2006: S/P bioprosthetic AVR.  Marland Kitchen. PAD (peripheral artery disease)     a. s/p LLE stenting.  . Carotid arterial disease     a. s/p R CEA  . History of tobacco abuse   . Moderate to Severe Mitral Regurgitation     a. 12/2013 Echo: Mod-Sev MR.   Past Surgical History  Procedure Laterality Date  . Aortic valve replacement (avr)/coronary artery bypass grafting (cabg)  12/2008  . Eye surgery    . Carotid angiogram  1996  . Coronary artery bypass graft    . Left lower extremity stent    . Carotid endarterectomy      Allergies  Allergies  Allergen Reactions  . Ambien [Zolpidem] Other (See Comments)    Hallucinations  . Ativan [Lorazepam] Other (See Comments)    hallucinations  . Penicillins     Unknown childhood reaction   . Sulfa Antibiotics     unknown    HPI  80 y/o male with the  above complex problem list.  He was admitted to [facility] on 10/14 with left sided chest pressure and afib with RVR.  He was also found to be volume overloaded.  He converted to sinus rhythm on IV dilt.  He was admitted and our team consulted on him.  Echo showed new LV dysfxn with and EF of 30-35%.  Dilt was switched to BB and cath was performed, showing moderate non-obstructive CAD.  He was diuresed down to 173 lbs and was discharged home on 10/22 on lasix 40mg  BID.  His previous home dose was 40mg  daily.  His creat rose to a peak of 1.79 during admission and came down to 1.53 by 10/22.  Since his discharge, he has not had any recurrent afib, palpitations, c/p, pnd, orthopnea, n, v, dizziness, syncope, dyspnea, or edema.  He has noted generalized fatigue/low energy.  His wt has come down some more @ home (172 on our scale today) and he had a bmet with his PCP yesterday revealing elevation of his BUN/Creat to 43/1.80 with a CO2 of 33 and K of 5.0.  LFT's were wnl.    Home Medications  Prior to Admission medications   Medication Sig  Start Date End Date Taking? Authorizing Provider  acetaminophen (TYLENOL) 500 MG tablet Take 1,000 mg by mouth every 6 (six) hours as needed for mild pain or headache.    Yes Historical Provider, MD  alendronate (FOSAMAX) 70 MG tablet Take 70 mg by mouth once a week. sunday 02/28/13  Yes Historical Provider, MD  allopurinol (ZYLOPRIM) 100 MG tablet Take 200 mg by mouth daily.   Yes Historical Provider, MD  atorvastatin (LIPITOR) 40 MG tablet Take 40 mg by mouth every evening.   Yes Historical Provider, MD  Calcium Carb-Cholecalciferol (CALCIUM 600 + D PO) Take 2 tablets by mouth every morning.   Yes Historical Provider, MD  clopidogrel (PLAVIX) 75 MG tablet Take 75 mg by mouth daily.   Yes Historical Provider, MD  metoprolol tartrate (LOPRESSOR) 25 MG tablet Take 1 tablet (25 mg total) by mouth 2 (two) times daily. 01/16/14  Yes Osvaldo Shipper, MD  Omega-3 Fatty Acids (FISH OIL)  1200 MG CAPS Take 1 capsule by mouth every morning.   Yes Historical Provider, MD  omeprazole (PRILOSEC) 40 MG capsule Take 40 mg by mouth every morning.   Yes Historical Provider, MD  traZODone (DESYREL) 50 MG tablet Take 1 tablet (50 mg total) by mouth at bedtime as needed for sleep. 01/16/14  Yes Osvaldo Shipper, MD  warfarin (COUMADIN) 1 MG tablet Take 1 mg by mouth once a week. On Sunday   Yes Historical Provider, MD  warfarin (COUMADIN) 4 MG tablet Take 4 mg by mouth See admin instructions. Monday-Saturday 02/24/13  Yes Historical Provider, MD  furosemide (LASIX) 40 MG tablet Take 1 tablet (40 mg total) by mouth daily. 01/21/14   Ok Anis, NP    Review of Systems  Fatigue as outlined above.  He denies chest pain, palpitations, dyspnea, pnd, orthopnea, n, v, dizziness, syncope, edema, weight gain, or early satiety.  All other systems reviewed and are otherwise negative except as noted above.  Physical Exam  Blood pressure 118/50, pulse 61, height 5\' 11"  (1.803 m), weight 172 lb (78.019 kg).  General: Pleasant, NAD Psych: Normal affect. Neuro: Alert and oriented X 3. Moves all extremities spontaneously. HEENT: Normal  Neck: Supple without bruits or JVD. Lungs:  Resp regular and unlabored, CTA. Heart: Irreg S1/S2, no s3, s4, 2/6 SEM RUSB with softer diast murmur. Abdomen: Soft, non-tender, non-distended, BS + x 4.  Extremities: No clubbing, cyanosis.  Trace bilat ankle edema above his sockline. DP/PT/Radials 2+ and equal bilaterally.  Accessory Clinical Findings  ECG - RSR, 61, PAC's, lbbb, no acute st/t changes.  Assessment & Plan  1.  Chronic combined systolic/diastolic CHF/NICM:  S/P recent hospitalization with volume overload and LV dysfxn (30-35% by echo) in the setting of rapid afib.  He has been fatigued but has no CHF Ss and is euvolemic on exam today.  He was discharged @ 173 lbs and is 172 lbs on our scale today.  He says that his previous dry wt was 180, though  that was the wt he came in at when he had volume overload.  Regardless, his BUN/Creat/CO2 were elevated on BMET yesterday (43/1.8/33 respectively) suggesting that he's prerenal w/ evidence of contraction alkalosis.  I will reduce his lasix back to 40mg  PO daily.  Given ongoing creatinine elevation, I will not be able to resume enalapril today, which he was previously on @ home.  Likewise, he is not a candidate for ARB/ARNI/Spironolactone at this time.  Cont BB therapy.  Not clear why he was  discharged on short-acting bb instead of Toprol XL in the setting of LV dysfxn, though now he has a full bottle of this Rx @ home.  Perhaps we can change him to Toprol XL upon f/u in ~ 1 month.  It is possible that beta blockade is contributing to fatigue and we will have to keep this in mind going forward.  We had a long discussion re: the mgmt of systolic dysfxn and the importance of daily wts, sodium restriction, and symptom reporting.  Both he and his wife verbalized understanding of all instructions and know to call us for wt changes or symptom development.  Provided that he has no further afib, we should plan to f/u echo in 3 mos to re-eval LV fxn once on max meds and determine whether or not he is a candidate for an ICD.  2.  PAF:  In sinus today. Cont BB and coumadin.  INR's followed by PCP.  3.  CKD III:  Creat 1.8 yesterday.  See #1.  Reduce lasix to 40mg  daily.  I have provided him Rx to have bmet repeated next wk @ his PCP's office.  Hold off on resuming ACEI for now.  Would like to resume ACEI once renal fxn stable on a steady diuretic dose.  4.  Non-obstructive CAD:  S/p Cath last week.  Cont bb/statin/plavix (not on asa).  5.  H/O CVA's/TIA's:  Chronic plavix and coumadin.  6.  Bioprosthetic AVR:  Mild AI on recent echo.  7.  Mod-Sev MR:  This was only mild in 02/2013 in setting of EF 40-45%.  ? If worsening 2/2 decline in LV fxn in setting of tachy induced cardiomyopathy.  Will need f/u echo to re-eval  as above.  8.  HTN: stable.  9.  HL:  On atorvastatin.  LDL 81 last year.  Nl LFT's yesterday.  10.  Dispo:  Reduce lasix to 40mg  daily. He'll have f/u bmet next week with PCP.  F/U Dr. Antoine Poche in 1 month or sooner if necessary.  Nicolasa Ducking, NP 01/21/2014, 4:34 PM

## 2014-01-21 NOTE — Telephone Encounter (Signed)
New message     Did we get lab work from yesterday from [facility]--Dr Hilda Blades

## 2014-01-21 NOTE — Telephone Encounter (Signed)
Discussed with Edson Snowball and Ward Givens, NP. Pt has 11:30 am appointment today with Thayer Ohm. No labs results found. Covering CMA is calling [facility] for results.

## 2014-01-26 HISTORY — PX: PACEMAKER INSERTION: SHX728

## 2014-01-27 ENCOUNTER — Telehealth: Payer: Self-pay | Admitting: Cardiology

## 2014-01-27 NOTE — Telephone Encounter (Signed)
New message          C/o not breathing properly / not getting good rest / pt wife would like for him to be seen tomorrow with Hochrein / please give pt a call

## 2014-01-27 NOTE — Telephone Encounter (Signed)
Pt was seen by Harlon Flor 01/21/14, lasix was decreased to 40mg  daily at that time. Pt's wife states pt has had gradual increase in SOB since office visit 01/21/14. Pt denies any increase in lower extremity edema, but is not able to recline, in recliner or in bed, without having more SOB.  Weight at home 01/21/14 167.2 lbs weight today 170.4 lbs, today BP 118/52 HR 68  I will forward to Dr Antoine Poche for review and recommendations.

## 2014-01-28 ENCOUNTER — Other Ambulatory Visit: Payer: Medicare HMO

## 2014-01-28 ENCOUNTER — Encounter: Payer: Self-pay | Admitting: Cardiology

## 2014-01-30 NOTE — Telephone Encounter (Signed)
Patient's wife called no answer.LMTC. 

## 2014-01-30 NOTE — Telephone Encounter (Signed)
Received a call from patient's wife.She stated husband has been having sob.Stated his sob seems worse since he was in hospital.Stated husband saw PCP this past Tue.01/28/14 and PCP thought he needed to see Dr.Hochrein soon.Stated husband is having a better day today sob not as bad.Appointment scheduled with Dr.Hochrein tomorrow 01/31/14 at 9:45 am.

## 2014-01-31 ENCOUNTER — Encounter: Payer: Self-pay | Admitting: Cardiology

## 2014-01-31 ENCOUNTER — Ambulatory Visit (INDEPENDENT_AMBULATORY_CARE_PROVIDER_SITE_OTHER): Payer: Medicare HMO | Admitting: Cardiology

## 2014-01-31 VITALS — BP 134/52 | Ht 71.0 in | Wt 174.5 lb

## 2014-01-31 DIAGNOSIS — I4891 Unspecified atrial fibrillation: Secondary | ICD-10-CM | POA: Diagnosis not present

## 2014-01-31 DIAGNOSIS — I5031 Acute diastolic (congestive) heart failure: Secondary | ICD-10-CM

## 2014-01-31 DIAGNOSIS — I509 Heart failure, unspecified: Secondary | ICD-10-CM

## 2014-01-31 DIAGNOSIS — I639 Cerebral infarction, unspecified: Secondary | ICD-10-CM

## 2014-01-31 NOTE — Patient Instructions (Signed)
Event Monitor 30 days   When you are short of breath  Check your oxygen level ( pulse ox ),heart rate,blood pressure keep a diary and bring to your next visit   Your physician recommends that you schedule a follow-up appointment in: 2 months with Dr.Hochrein  Friday 04/04/14 at 11:30 am    Schedule appointment with Neurology for previous stroke

## 2014-01-31 NOTE — Progress Notes (Signed)
HPI The patient presents for followup. He has a complicated past history with coronary artery disease and cardiomyopathy. He presented with atrial fibrillation with rapid ventricular rate did convert to sinus rhythm. His ejection fraction however was slightly lower than previous known yet. He was down 30-35%. He did feel better after diuresis. He was restarted on anticoagulation after having cardiac catheterization that demonstrated nonobstructive disease. Since going home he's had increased episodes of agitation or anxiety. His might be precipitated by episodes of shortness of breath. He saw his primary provider and was given Xanax to take when necessary. He says that he will get short of breath typically in the afternoon. He is not having palpitations when this happens that he notices. He's not describing chest pain. His weight is actually been down. His most recent BUN and creatinine demonstrated that he was somewhat prerenal. His Lasix dose was actually decreased at the last cardiology visit but he did have another 20 mg by his primary provider as he had some increased ankle edema.  Allergies  Allergen Reactions  . Ambien [Zolpidem] Other (See Comments)    Hallucinations  . Ativan [Lorazepam] Other (See Comments)    hallucinations  . Penicillins     Unknown childhood reaction   . Sulfa Antibiotics     unknown    Current Outpatient Prescriptions  Medication Sig Dispense Refill  . acetaminophen (TYLENOL) 500 MG tablet Take 1,000 mg by mouth every 6 (six) hours as needed for mild pain or headache.     . alendronate (FOSAMAX) 70 MG tablet Take 70 mg by mouth once a week. sunday    . allopurinol (ZYLOPRIM) 100 MG tablet Take 200 mg by mouth daily.    Marland Kitchen. atorvastatin (LIPITOR) 40 MG tablet Take 40 mg by mouth every evening.    . Calcium Carb-Cholecalciferol (CALCIUM 600 + D PO) Take 2 tablets by mouth every morning.    . clopidogrel (PLAVIX) 75 MG tablet Take 75 mg by mouth daily.    .  metoprolol tartrate (LOPRESSOR) 25 MG tablet Take 1 tablet (25 mg total) by mouth 2 (two) times daily. 60 tablet 3  . Omega-3 Fatty Acids (FISH OIL) 1200 MG CAPS Take 1 capsule by mouth every morning.    Marland Kitchen. omeprazole (PRILOSEC) 40 MG capsule Take 40 mg by mouth every morning.    . traZODone (DESYREL) 50 MG tablet Take 1 tablet (50 mg total) by mouth at bedtime as needed for sleep. 15 tablet 0  . warfarin (COUMADIN) 1 MG tablet Take 1 mg by mouth once a week. On Sunday    . warfarin (COUMADIN) 4 MG tablet Take 4 mg by mouth See admin instructions. Monday-Saturday     No current facility-administered medications for this visit.    Past Medical History  Diagnosis Date  . Arthritis   . Non-obstructive CAD     a. 12/2013 NSTEMI/Cath: LM nl, LAD 20, LCX 40-50, RCA 20.  Marland Kitchen. Hypertension   . Stroke     a. July 2000;  b. January 2007;  c. TIA in 2014.  . CKD (chronic kidney disease), stage III   . GERD (gastroesophageal reflux disease)   . Foot pain, bilateral   . H/O hematuria   . Hyperlipidemia   . Vitamin D deficiency   . Chronic combined systolic and diastolic CHF (congestive heart failure)     a. 12/2013 Echo: EF 30-35%, mod conc LVH, Gr 3 DD, mild AI, mod-sev MR, mod dil LA, mild TR.  .Marland Kitchen  PAF (paroxysmal atrial fibrillation)     a. CHA2DS2VASc = 7--> Chronic Coumadin.  Marland Kitchen NICM (nonischemic cardiomyopathy)     a. 12/2013 Echo: EF 30-35%.  . Aortic valve prosthesis present     a. 2006: S/P bioprosthetic AVR.  Marland Kitchen PAD (peripheral artery disease)     a. s/p LLE stenting.  . Carotid arterial disease     a. s/p R CEA  . History of tobacco abuse   . Moderate to Severe Mitral Regurgitation     a. 12/2013 Echo: Mod-Sev MR.    Past Surgical History  Procedure Laterality Date  . Aortic valve replacement (avr)/coronary artery bypass grafting (cabg)  12/2008  . Eye surgery    . Carotid angiogram  1996  . Coronary artery bypass graft    . Left lower extremity stent    . Carotid endarterectomy       ROS:  As stated in the HPI and negative for all other systems.  PHYSICAL EXAM BP 134/52 mmHg  Ht 5\' 11"  (1.803 m)  Wt 174 lb 8 oz (79.153 kg)  BMI 24.35 kg/m2 GENERAL:  Frail appearing HEENT:  Pupils equal round and reactive, fundi not visualized, oral mucosa unremarkable NECK:  No jugular venous distention, waveform within normal limits, carotid upstroke brisk and symmetric, no bruits, no thyromegaly LYMPHATICS:  No cervical, inguinal adenopathy LUNGS:  Clear to auscultation bilaterally BACK:  No CVA tenderness CHEST:  Unremarkable HEART:  PMI not displaced or sustained,S1 and S2 within normal limits, no S3, no S4, no clicks, no rubs, 3/6 apical holosystolic murmur, no diastolic murmurs ABD:  Flat, positive bowel sounds normal in frequency in pitch, no bruits, no rebound, no guarding, no midline pulsatile mass, no hepatomegaly, no splenomegaly EXT:  2 plus pulses throughout, mild edema, no cyanosis no clubbing SKIN:  No rashes no nodules NEURO:  Cranial nerves II through XII grossly intact, motor grossly intact throughout PSYCH:  Cognitively intact, oriented to person place and time  EKG:  Sinus rhythm, rate 60 to come interventricular conduction delay, premature ectopic complexes, no acute ST-T wave changes.  01/31/2014   ASSESSMENT AND PLAN  DYSPNEA:  He's having acute episodes I don't entirely understand.  I am going to start with an event monitor. Timothy Durham will need a 21 day event monitor.  The patients symptoms necessitate an event monitor.  The symptoms are too infrequent to be identified on a Holter monitor.    Would like to see if she's having any evidence of atrial fibrillation with this. He also needs followup with neurology or psychiatry as he might be having acute anxiety. I less likely think I would be held for the right heart catheterization for pulmonary function testing although I may refer him for pulmonary consultation and some of his dyspnea is chronic. He  needs a 4 g sodium diet. I would like to have recording of his heart rate, blood pressure and oxygen saturation is having these events and I have talked about this with his wife.  ATRIAL FIB:  This will be evaluated as above.  CAD:  Nonobstructive.  ANXIETY:  Given his past neurologic history of this change in his status I would start with a new neurology consult as he needs to establish with a neurologist.  He might need a psychiatry appt. as well.  CKD:  I will likely repeat a BMET when he comes back.

## 2014-02-09 ENCOUNTER — Inpatient Hospital Stay (HOSPITAL_BASED_OUTPATIENT_CLINIC_OR_DEPARTMENT_OTHER)
Admission: EM | Admit: 2014-02-09 | Discharge: 2014-02-11 | DRG: 292 | Disposition: A | Discharge status: Home / Self Care | Payer: Medicare HMO | Attending: Cardiology | Admitting: Cardiology

## 2014-02-09 ENCOUNTER — Telehealth: Payer: Self-pay | Admitting: Physician Assistant

## 2014-02-09 ENCOUNTER — Encounter (HOSPITAL_BASED_OUTPATIENT_CLINIC_OR_DEPARTMENT_OTHER): Payer: Self-pay | Admitting: Emergency Medicine

## 2014-02-09 ENCOUNTER — Emergency Department (HOSPITAL_BASED_OUTPATIENT_CLINIC_OR_DEPARTMENT_OTHER): Payer: Medicare HMO

## 2014-02-09 DIAGNOSIS — E785 Hyperlipidemia, unspecified: Secondary | ICD-10-CM | POA: Diagnosis present

## 2014-02-09 DIAGNOSIS — I509 Heart failure, unspecified: Secondary | ICD-10-CM

## 2014-02-09 DIAGNOSIS — Z79899 Other long term (current) drug therapy: Secondary | ICD-10-CM | POA: Diagnosis not present

## 2014-02-09 DIAGNOSIS — Z66 Do not resuscitate: Secondary | ICD-10-CM | POA: Diagnosis present

## 2014-02-09 DIAGNOSIS — N183 Chronic kidney disease, stage 3 unspecified: Secondary | ICD-10-CM | POA: Diagnosis present

## 2014-02-09 DIAGNOSIS — I340 Nonrheumatic mitral (valve) insufficiency: Secondary | ICD-10-CM | POA: Diagnosis present

## 2014-02-09 DIAGNOSIS — R0600 Dyspnea, unspecified: Secondary | ICD-10-CM | POA: Diagnosis present

## 2014-02-09 DIAGNOSIS — Z951 Presence of aortocoronary bypass graft: Secondary | ICD-10-CM | POA: Diagnosis not present

## 2014-02-09 DIAGNOSIS — Z8673 Personal history of transient ischemic attack (TIA), and cerebral infarction without residual deficits: Secondary | ICD-10-CM

## 2014-02-09 DIAGNOSIS — Z7902 Long term (current) use of antithrombotics/antiplatelets: Secondary | ICD-10-CM

## 2014-02-09 DIAGNOSIS — I5043 Acute on chronic combined systolic (congestive) and diastolic (congestive) heart failure: Principal | ICD-10-CM | POA: Diagnosis present

## 2014-02-09 DIAGNOSIS — K219 Gastro-esophageal reflux disease without esophagitis: Secondary | ICD-10-CM | POA: Diagnosis present

## 2014-02-09 DIAGNOSIS — I252 Old myocardial infarction: Secondary | ICD-10-CM

## 2014-02-09 DIAGNOSIS — I272 Other secondary pulmonary hypertension: Secondary | ICD-10-CM | POA: Diagnosis present

## 2014-02-09 DIAGNOSIS — Z952 Presence of prosthetic heart valve: Secondary | ICD-10-CM

## 2014-02-09 DIAGNOSIS — I739 Peripheral vascular disease, unspecified: Secondary | ICD-10-CM | POA: Diagnosis present

## 2014-02-09 DIAGNOSIS — Z7901 Long term (current) use of anticoagulants: Secondary | ICD-10-CM

## 2014-02-09 DIAGNOSIS — M1990 Unspecified osteoarthritis, unspecified site: Secondary | ICD-10-CM | POA: Diagnosis present

## 2014-02-09 DIAGNOSIS — I129 Hypertensive chronic kidney disease with stage 1 through stage 4 chronic kidney disease, or unspecified chronic kidney disease: Secondary | ICD-10-CM | POA: Diagnosis present

## 2014-02-09 DIAGNOSIS — I447 Left bundle-branch block, unspecified: Secondary | ICD-10-CM | POA: Diagnosis present

## 2014-02-09 DIAGNOSIS — I5021 Acute systolic (congestive) heart failure: Secondary | ICD-10-CM

## 2014-02-09 DIAGNOSIS — I480 Paroxysmal atrial fibrillation: Secondary | ICD-10-CM | POA: Diagnosis present

## 2014-02-09 DIAGNOSIS — Z87891 Personal history of nicotine dependence: Secondary | ICD-10-CM | POA: Diagnosis not present

## 2014-02-09 DIAGNOSIS — I429 Cardiomyopathy, unspecified: Secondary | ICD-10-CM | POA: Diagnosis present

## 2014-02-09 DIAGNOSIS — I10 Essential (primary) hypertension: Secondary | ICD-10-CM | POA: Diagnosis present

## 2014-02-09 DIAGNOSIS — I2510 Atherosclerotic heart disease of native coronary artery without angina pectoris: Secondary | ICD-10-CM | POA: Diagnosis present

## 2014-02-09 DIAGNOSIS — G459 Transient cerebral ischemic attack, unspecified: Secondary | ICD-10-CM | POA: Diagnosis present

## 2014-02-09 DIAGNOSIS — I428 Other cardiomyopathies: Secondary | ICD-10-CM

## 2014-02-09 LAB — URINALYSIS, ROUTINE W REFLEX MICROSCOPIC
BILIRUBIN URINE: NEGATIVE
Glucose, UA: NEGATIVE mg/dL
Hgb urine dipstick: NEGATIVE
Ketones, ur: 15 mg/dL — AB
Leukocytes, UA: NEGATIVE
NITRITE: NEGATIVE
Protein, ur: NEGATIVE mg/dL
SPECIFIC GRAVITY, URINE: 1.011 (ref 1.005–1.030)
Urobilinogen, UA: 0.2 mg/dL (ref 0.0–1.0)
pH: 5.5 (ref 5.0–8.0)

## 2014-02-09 LAB — CBC WITH DIFFERENTIAL/PLATELET
BASOS ABS: 0 10*3/uL (ref 0.0–0.1)
Basophils Relative: 0 % (ref 0–1)
Eosinophils Absolute: 0.2 10*3/uL (ref 0.0–0.7)
Eosinophils Relative: 2 % (ref 0–5)
HCT: 40.9 % (ref 39.0–52.0)
Hemoglobin: 14.2 g/dL (ref 13.0–17.0)
Lymphocytes Relative: 6 % — ABNORMAL LOW (ref 12–46)
Lymphs Abs: 0.5 10*3/uL — ABNORMAL LOW (ref 0.7–4.0)
MCH: 33.7 pg (ref 26.0–34.0)
MCHC: 34.7 g/dL (ref 30.0–36.0)
MCV: 97.1 fL (ref 78.0–100.0)
MONO ABS: 0.8 10*3/uL (ref 0.1–1.0)
Monocytes Relative: 9 % (ref 3–12)
NEUTROS PCT: 83 % — AB (ref 43–77)
Neutro Abs: 7.5 10*3/uL (ref 1.7–7.7)
PLATELETS: 222 10*3/uL (ref 150–400)
RBC: 4.21 MIL/uL — AB (ref 4.22–5.81)
RDW: 14.1 % (ref 11.5–15.5)
WBC: 9.1 10*3/uL (ref 4.0–10.5)

## 2014-02-09 LAB — PROTIME-INR
INR: 4.7 — AB (ref 0.00–1.49)
PROTHROMBIN TIME: 44.2 s — AB (ref 11.6–15.2)

## 2014-02-09 LAB — BASIC METABOLIC PANEL
Anion gap: 17 — ABNORMAL HIGH (ref 5–15)
BUN: 36 mg/dL — AB (ref 6–23)
CALCIUM: 9.6 mg/dL (ref 8.4–10.5)
CO2: 25 mEq/L (ref 19–32)
CREATININE: 1.3 mg/dL (ref 0.50–1.35)
Chloride: 92 mEq/L — ABNORMAL LOW (ref 96–112)
GFR, EST AFRICAN AMERICAN: 58 mL/min — AB (ref >90)
GFR, EST NON AFRICAN AMERICAN: 50 mL/min — AB (ref >90)
GLUCOSE: 128 mg/dL — AB (ref 70–99)
POTASSIUM: 5 meq/L (ref 3.7–5.3)
Sodium: 134 mEq/L — ABNORMAL LOW (ref 137–147)

## 2014-02-09 LAB — PRO B NATRIURETIC PEPTIDE: Pro B Natriuretic peptide (BNP): 17899 pg/mL — ABNORMAL HIGH (ref 0–450)

## 2014-02-09 LAB — TROPONIN I: Troponin I: <0.3 ng/mL (ref <0.30)

## 2014-02-09 LAB — MRSA PCR SCREENING: MRSA by PCR: NEGATIVE

## 2014-02-09 MED ORDER — FUROSEMIDE 10 MG/ML IJ SOLN
80.0000 mg | Freq: Once | INTRAMUSCULAR | Status: AC
  Administered 2014-02-09: 80 mg via INTRAVENOUS
  Filled 2014-02-09: qty 8

## 2014-02-09 MED ORDER — ACETAMINOPHEN 500 MG PO TABS: 1000.0000 mg | ORAL_TABLET | Freq: Once | ORAL | Status: DC

## 2014-02-09 MED ORDER — ACETAMINOPHEN 500 MG PO TABS
1000.0000 mg | ORAL_TABLET | Freq: Once | ORAL | Status: AC
  Administered 2014-02-09: 1000 mg via ORAL
  Filled 2014-02-09: qty 2

## 2014-02-09 MED ORDER — DULOXETINE HCL 30 MG PO CPEP
30.0000 mg | ORAL_CAPSULE | Freq: Every day | ORAL | Status: DC
  Administered 2014-02-09: 30 mg via ORAL
  Filled 2014-02-09 (×2): qty 1

## 2014-02-09 NOTE — Telephone Encounter (Signed)
Timothy Durham is a 80 y.o. male with a hx of PAF, systolic HF and CKD.  He has been having issues with increased dyspnea and is currently wearing an event monitor.  His wife called today b/c he is more short of breath.  He feels like he can't get a deep breath.  No orthopnea, PND.  No edema or weight gain. No palpitations.  His O2 sats are in the 90s.  BP this AM was 136/100.  He notes decreased UOP but no dysuria.  He notes chills but no fevers. I have advised him to go to urgent care for further evaluation today.  He plans to go to [facility] on [address]. Signed, Tereso Newcomer, PA-C   02/09/2014 4:22 PM

## 2014-02-09 NOTE — ED Notes (Signed)
Pt ambulated around dept. On room air. sats stayed between 89-91%. Pt in no distress at any time during walk.

## 2014-02-09 NOTE — ED Provider Notes (Signed)
CSN: [id number]     Arrival date & time 02/09/14  1643 History  This chart was scribed for Timothy Bucco, MD by Roxy Cedar, ED Scribe. This patient was seen in room MH04/MH04 and the patient's care was started at 5:55 PM.   Chief Complaint  Patient presents with  . Shortness of Breath   Patient is a 80 y.o. male presenting with shortness of breath. The history is provided by the patient. No language interpreter was used.  Shortness of Breath Associated symptoms: no abdominal pain, no chest pain, no cough, no diaphoresis, no fever, no headaches, no rash and no vomiting    HPI Comments: Timothy Durham is a 80 y.o. male with a history of hypertension, stroke, GERD, CHF, non obstructive CAD (in 12/2013), who presents to the Emergency Department complaining of SOB "more than he usually does" since yesterday. He states that his symptoms today are not as severe as those experienced when he had a recent admission for CHF. Patient was initially prescribed to take Lasix 40mg  BID. He was briefly discontinued for 1 week from taking Lasix due to creatinine levels, and began taking it again on 01/04/2014. He currently takes Lasix 40mg  in the morning and 20mg  at night. He states SOB worsens with lying down and on minimal exertion. He reports associated chills, decreased appetite. He denies cough, congestion, chest pain, edema in legs, emesis. His cardiologist is Dr. Antoine Poche. He is currently wearing an event monitor.  Past Medical History  Diagnosis Date  . Arthritis   . Non-obstructive CAD     a. 12/2013 NSTEMI/Cath: LM nl, LAD 20, LCX 40-50, RCA 20.  Marland Kitchen Hypertension   . Stroke     a. July 2000;  b. January 2007;  c. TIA in 2014.  . CKD (chronic kidney disease), stage III   . GERD (gastroesophageal reflux disease)   . Foot pain, bilateral   . H/O hematuria   . Hyperlipidemia   . Vitamin D deficiency   . Chronic combined systolic and diastolic CHF (congestive heart failure)     a. 12/2013 Echo: EF  30-35%, mod conc LVH, Gr 3 DD, mild AI, mod-sev MR, mod dil LA, mild TR.  Marland Kitchen PAF (paroxysmal atrial fibrillation)     a. CHA2DS2VASc = 7--> Chronic Coumadin.  Marland Kitchen NICM (nonischemic cardiomyopathy)     a. 12/2013 Echo: EF 30-35%.  . Aortic valve prosthesis present     a. 2006: S/P bioprosthetic AVR.  Marland Kitchen PAD (peripheral artery disease)     a. s/p LLE stenting.  . Carotid arterial disease     a. s/p R CEA  . History of tobacco abuse   . Moderate to Severe Mitral Regurgitation     a. 12/2013 Echo: Mod-Sev MR.   Past Surgical History  Procedure Laterality Date  . Aortic valve replacement (avr)/coronary artery bypass grafting (cabg)  12/2008  . Eye surgery    . Carotid angiogram  1996  . Coronary artery bypass graft    . Left lower extremity stent    . Carotid endarterectomy     Family History  Problem Relation Age of Onset  . Stroke Mother   . Cancer Father    History  Substance Use Topics  . Smoking status: Former Smoker    Quit date: 09/26/1998  . Smokeless tobacco: Never Used  . Alcohol Use: 0.6 oz/week    1 Cans of beer per week     Comment: drinks one can of beer daily.   Review  of Systems  Constitutional: Negative for fever, chills, diaphoresis and fatigue.  HENT: Negative for congestion, rhinorrhea and sneezing.   Eyes: Negative.   Respiratory: Positive for shortness of breath. Negative for cough and chest tightness.   Cardiovascular: Negative for chest pain and leg swelling.  Gastrointestinal: Negative for nausea, vomiting, abdominal pain, diarrhea and blood in stool.  Genitourinary: Negative for frequency, hematuria, flank pain and difficulty urinating.  Musculoskeletal: Negative for back pain and arthralgias.  Skin: Negative for rash.  Neurological: Negative for dizziness, speech difficulty, weakness, numbness and headaches.   Allergies  Ambien; Ativan; Penicillins; and Sulfa antibiotics  Home Medications   Prior to Admission medications   Medication Sig Start  Date End Date Taking? Authorizing Provider  acetaminophen (TYLENOL) 500 MG tablet Take 1,000 mg by mouth every 6 (six) hours as needed for mild pain or headache.    Yes Historical Provider, MD  alendronate (FOSAMAX) 70 MG tablet Take 70 mg by mouth once a week. sunday 02/28/13  Yes Historical Provider, MD  allopurinol (ZYLOPRIM) 100 MG tablet Take 200 mg by mouth daily.   Yes Historical Provider, MD  atorvastatin (LIPITOR) 40 MG tablet Take 40 mg by mouth every evening.   Yes Historical Provider, MD  Calcium Carb-Cholecalciferol (CALCIUM 600 + D PO) Take 2 tablets by mouth every morning.   Yes Historical Provider, MD  clopidogrel (PLAVIX) 75 MG tablet Take 75 mg by mouth daily.   Yes Historical Provider, MD  DULoxetine (CYMBALTA) 30 MG capsule Take 30 mg by mouth daily.   Yes Historical Provider, MD  furosemide (LASIX) 40 MG tablet Take 60 mg by mouth. Take 1 pill (40mg ) in the morning and 0.5 pill (20mg ) in the evening   Yes Historical Provider, MD  metoprolol tartrate (LOPRESSOR) 25 MG tablet Take 1 tablet (25 mg total) by mouth 2 (two) times daily. 01/16/14  Yes Osvaldo ShipperGokul Krishnan, MD  Omega-3 Fatty Acids (FISH OIL) 1200 MG CAPS Take 1 capsule by mouth every morning.   Yes Historical Provider, MD  omeprazole (PRILOSEC) 40 MG capsule Take 40 mg by mouth every morning.   Yes Historical Provider, MD  warfarin (COUMADIN) 1 MG tablet Take 1 mg by mouth once a week. On Sunday   Yes Historical Provider, MD  warfarin (COUMADIN) 4 MG tablet Take 4 mg by mouth See admin instructions. Monday-Saturday 02/24/13  Yes Historical Provider, MD  traZODone (DESYREL) 50 MG tablet Take 1 tablet (50 mg total) by mouth at bedtime as needed for sleep. 01/16/14   Osvaldo ShipperGokul Krishnan, MD   Triage Vitals: BP 143/48 mmHg  Pulse 96  Temp(Src) 98 F (36.7 C) (Axillary)  Resp 14  Ht 5\' 11"  (1.803 m)  Wt 170 lb (77.111 kg)  BMI 23.72 kg/m2  SpO2 97%  Physical Exam  Constitutional: He is oriented to person, place, and time. He  appears well-developed and well-nourished.  HENT:  Head: Normocephalic and atraumatic.  Eyes: Pupils are equal, round, and reactive to light.  Neck: Normal range of motion. Neck supple.  Cardiovascular: Normal rate, regular rhythm and normal heart sounds.   Pulmonary/Chest: Effort normal. No respiratory distress. He has no wheezes. He has rales. He exhibits no tenderness.  Positive crackles in the bases bilaterally.   Abdominal: Soft. Bowel sounds are normal. There is no tenderness. There is no rebound and no guarding.  Musculoskeletal: Normal range of motion. He exhibits edema.  Trace edema bilaterally.  Lymphadenopathy:    He has no cervical adenopathy.  Neurological: He  is alert and oriented to person, place, and time.  Skin: Skin is warm and dry. No rash noted.  Psychiatric: He has a normal mood and affect.  Nursing note and vitals reviewed.  ED Course  Procedures (including critical care time)  DIAGNOSTIC STUDIES: Oxygen Saturation is 97% on RA, normal by my interpretation.    COORDINATION OF CARE: 6:02 PM- Discussed plans to order diagnostic lab work, CXR, and EKG. Discussed plans to give patient Lasix with IV fluids. Pt advised of plan for treatment and pt agrees.  Labs Review Labs Reviewed  BASIC METABOLIC PANEL - Abnormal; Notable for the following:    Sodium 134 (*)    Chloride 92 (*)    Glucose, Bld 128 (*)    BUN 36 (*)    GFR calc non Af Amer 50 (*)    GFR calc Af Amer 58 (*)    Anion gap 17 (*)    All other components within normal limits  CBC WITH DIFFERENTIAL - Abnormal; Notable for the following:    RBC 4.21 (*)    Neutrophils Relative % 83 (*)    Lymphocytes Relative 6 (*)    Lymphs Abs 0.5 (*)    All other components within normal limits  PRO B NATRIURETIC PEPTIDE - Abnormal; Notable for the following:    Pro B Natriuretic peptide (BNP) 17899.0 (*)    All other components within normal limits  PROTIME-INR - Abnormal; Notable for the following:     Prothrombin Time 44.2 (*)    INR 4.70 (*)    All other components within normal limits  URINALYSIS, ROUTINE W REFLEX MICROSCOPIC - Abnormal; Notable for the following:    Ketones, ur 15 (*)    All other components within normal limits  TROPONIN I   Imaging Review Dg Chest 2 View  02/09/2014   CLINICAL DATA:  Shortness of breath  EXAM: CHEST  2 VIEW  COMPARISON:  01/12/2014  FINDINGS: Postsurgical changes are again seen. The cardiac shadow is stable. Small bilateral pleural effusions are noted as well as fluid trapped within the minor fissure on the right. Mild vascular congestion with interstitial edema is noted. The overall appearance of the congestion is improved when compare with the prior exam however.  IMPRESSION: CHF with small bilateral pleural effusions and fluid trapped within the minor fissure on the right.   Electronically Signed   By: Alcide Clever M.D.   On: 02/09/2014 17:48    EKG Interpretation   Date/Time:  Sunday February 09 2014 17:18:20 EST Ventricular Rate:  88 PR Interval:  196 QRS Duration: 154 QT Interval:  400 QTC Calculation: 484 R Axis:   6 Text Interpretation:  Normal sinus rhythm Left bundle branch block  Abnormal ECG similar to prior EKG Confirmed by Marisah Laker  MD, Harleyquinn Gasser ([id number])  on 02/09/2014 5:51:50 PM     MDM   Final diagnoses:  Acute systolic congestive heart failure    Pt given lasix 80mg .  He is not feeling much better.  His BNP is markedly elevated.  CXR shows some vascular congestion although better than his prior CXR.  On ambulation, his HR increases to 120s-130s and his sat dropped to 89-90%.  I spoke with Dr. Duke Salvia, cardiology fellow, who has accepted pt for transfer to [facility] for admission to Dr. Norris Cross service.   I personally performed the services described in this documentation, which was scribed in my presence. The recorded information has been reviewed and is accurate.  Shawna Orleans  Fredderick Phenix, MD 02/09/14 2232

## 2014-02-09 NOTE — ED Notes (Signed)
Pt presents to shortness of breath, "more than he normally has".

## 2014-02-09 NOTE — ED Notes (Signed)
Called [facility] spoke with rep Kandee Keen, stated that truck would be coming from [facility]

## 2014-02-10 DIAGNOSIS — I429 Cardiomyopathy, unspecified: Secondary | ICD-10-CM

## 2014-02-10 DIAGNOSIS — N183 Chronic kidney disease, stage 3 (moderate): Secondary | ICD-10-CM

## 2014-02-10 DIAGNOSIS — I4891 Unspecified atrial fibrillation: Secondary | ICD-10-CM

## 2014-02-10 DIAGNOSIS — Z7901 Long term (current) use of anticoagulants: Secondary | ICD-10-CM

## 2014-02-10 DIAGNOSIS — Z954 Presence of other heart-valve replacement: Secondary | ICD-10-CM

## 2014-02-10 DIAGNOSIS — I341 Nonrheumatic mitral (valve) prolapse: Secondary | ICD-10-CM

## 2014-02-10 DIAGNOSIS — I5023 Acute on chronic systolic (congestive) heart failure: Secondary | ICD-10-CM

## 2014-02-10 DIAGNOSIS — I447 Left bundle-branch block, unspecified: Secondary | ICD-10-CM | POA: Diagnosis present

## 2014-02-10 DIAGNOSIS — Z8673 Personal history of transient ischemic attack (TIA), and cerebral infarction without residual deficits: Secondary | ICD-10-CM

## 2014-02-10 DIAGNOSIS — I5043 Acute on chronic combined systolic (congestive) and diastolic (congestive) heart failure: Secondary | ICD-10-CM

## 2014-02-10 DIAGNOSIS — I480 Paroxysmal atrial fibrillation: Secondary | ICD-10-CM

## 2014-02-10 LAB — PROTIME-INR
INR: 4.04 — ABNORMAL HIGH (ref 0.00–1.49)
Prothrombin Time: 39.6 seconds — ABNORMAL HIGH (ref 11.6–15.2)

## 2014-02-10 LAB — BASIC METABOLIC PANEL
ANION GAP: 15 (ref 5–15)
BUN: 33 mg/dL — AB (ref 6–23)
CALCIUM: 8.7 mg/dL (ref 8.4–10.5)
CO2: 27 meq/L (ref 19–32)
Chloride: 93 mEq/L — ABNORMAL LOW (ref 96–112)
Creatinine, Ser: 1.37 mg/dL — ABNORMAL HIGH (ref 0.50–1.35)
GFR calc Af Amer: 54 mL/min — ABNORMAL LOW (ref >90)
GFR calc non Af Amer: 47 mL/min — ABNORMAL LOW (ref >90)
GLUCOSE: 98 mg/dL (ref 70–99)
Potassium: 3.9 mEq/L (ref 3.7–5.3)
SODIUM: 135 meq/L — AB (ref 137–147)

## 2014-02-10 MED ORDER — PANTOPRAZOLE SODIUM 40 MG PO TBEC
40.0000 mg | DELAYED_RELEASE_TABLET | Freq: Every day | ORAL | Status: DC
  Administered 2014-02-10 – 2014-02-11 (×2): 40 mg via ORAL
  Filled 2014-02-10 (×2): qty 1

## 2014-02-10 MED ORDER — SODIUM CHLORIDE 0.9 % IV SOLN: 250.0000 mL | INTRAVENOUS | Status: DC | PRN

## 2014-02-10 MED ORDER — ASPIRIN EC 81 MG PO TBEC: 81.0000 mg | DELAYED_RELEASE_TABLET | Freq: Every day | ORAL | Status: DC

## 2014-02-10 MED ORDER — DULOXETINE HCL 30 MG PO CPEP: 30.0000 mg | ORAL_CAPSULE | Freq: Every day | ORAL | Status: DC

## 2014-02-10 MED ORDER — SODIUM CHLORIDE 0.9 % IJ SOLN
3.0000 mL | Freq: Two times a day (BID) | INTRAMUSCULAR | Status: DC
  Administered 2014-02-10 – 2014-02-11 (×4): 3 mL via INTRAVENOUS

## 2014-02-10 MED ORDER — SODIUM CHLORIDE 0.9 % IJ SOLN: 3.0000 mL | INTRAMUSCULAR | Status: DC | PRN

## 2014-02-10 MED ORDER — FUROSEMIDE 10 MG/ML IJ SOLN
60.0000 mg | Freq: Every day | INTRAMUSCULAR | Status: AC
  Administered 2014-02-10: 60 mg via INTRAVENOUS
  Filled 2014-02-10: qty 6

## 2014-02-10 MED ORDER — FUROSEMIDE 40 MG PO TABS: 40.0000 mg | ORAL_TABLET | Freq: Two times a day (BID) | ORAL | Status: DC

## 2014-02-10 MED ORDER — DULOXETINE HCL 30 MG PO CPEP
30.0000 mg | ORAL_CAPSULE | Freq: Every day | ORAL | Status: DC
  Filled 2014-02-10: qty 1

## 2014-02-10 MED ORDER — ACETAMINOPHEN 500 MG PO TABS
1000.0000 mg | ORAL_TABLET | Freq: Once | ORAL | Status: AC
  Administered 2014-02-10: 1000 mg via ORAL
  Filled 2014-02-10: qty 2

## 2014-02-10 MED ORDER — ATORVASTATIN CALCIUM 40 MG PO TABS
40.0000 mg | ORAL_TABLET | Freq: Every evening | ORAL | Status: DC
  Administered 2014-02-10: 40 mg via ORAL
  Filled 2014-02-10: qty 1

## 2014-02-10 MED ORDER — CALCIUM CARBONATE-VITAMIN D 500-200 MG-UNIT PO TABS
2.0000 | ORAL_TABLET | Freq: Every day | ORAL | Status: DC
  Administered 2014-02-10 – 2014-02-11 (×2): 2 via ORAL
  Filled 2014-02-10: qty 1
  Filled 2014-02-10 (×2): qty 2

## 2014-02-10 MED ORDER — CLOPIDOGREL BISULFATE 75 MG PO TABS
75.0000 mg | ORAL_TABLET | Freq: Every day | ORAL | Status: DC
  Administered 2014-02-10 – 2014-02-11 (×2): 75 mg via ORAL
  Filled 2014-02-10 (×2): qty 1

## 2014-02-10 MED ORDER — FUROSEMIDE 10 MG/ML IJ SOLN
60.0000 mg | Freq: Once | INTRAMUSCULAR | Status: AC
  Administered 2014-02-10: 60 mg via INTRAVENOUS
  Filled 2014-02-10: qty 6

## 2014-02-10 MED ORDER — ENSURE COMPLETE PO LIQD: 237.0000 mL | Freq: Every day | ORAL | Status: DC | PRN

## 2014-02-10 MED ORDER — ACETAMINOPHEN 325 MG PO TABS: 650.0000 mg | ORAL_TABLET | ORAL | Status: DC | PRN

## 2014-02-10 MED ORDER — ALLOPURINOL 100 MG PO TABS
200.0000 mg | ORAL_TABLET | Freq: Every day | ORAL | Status: DC
  Administered 2014-02-10 – 2014-02-11 (×2): 200 mg via ORAL
  Filled 2014-02-10 (×2): qty 2

## 2014-02-10 MED ORDER — METOPROLOL TARTRATE 25 MG PO TABS
25.0000 mg | ORAL_TABLET | Freq: Two times a day (BID) | ORAL | Status: DC
  Administered 2014-02-10 – 2014-02-11 (×4): 25 mg via ORAL
  Filled 2014-02-10 (×6): qty 1

## 2014-02-10 MED ORDER — ONDANSETRON HCL 4 MG/2ML IJ SOLN: 4.0000 mg | Freq: Four times a day (QID) | INTRAMUSCULAR | Status: DC | PRN

## 2014-02-10 MED ORDER — FUROSEMIDE 80 MG PO TABS
80.0000 mg | ORAL_TABLET | Freq: Two times a day (BID) | ORAL | Status: DC
  Administered 2014-02-11: 80 mg via ORAL
  Filled 2014-02-10 (×2): qty 1

## 2014-02-10 MED ORDER — DULOXETINE HCL 30 MG PO CPEP
30.0000 mg | ORAL_CAPSULE | Freq: Every day | ORAL | Status: DC
  Administered 2014-02-10: 30 mg via ORAL
  Filled 2014-02-10: qty 1

## 2014-02-10 NOTE — Care Management Note (Signed)
    Page 1 of 1   02/10/2014     8:24:18 AM CARE MANAGEMENT NOTE 02/10/2014  Patient:  Timothy Durham, Timothy Durham   Account Number:  [id number]  Date Initiated:  02/10/2014  Documentation initiated by:  Junius Creamer  Subjective/Objective Assessment:   adm w heart failure     Action/Plan:   lives w wife, pcp dr Dennis Bast   Anticipated DC Date:     Anticipated DC Plan:  HOME W HOME HEALTH SERVICES      DC Planning Services  CM consult      Choice offered to / List presented to:             Status of service:   Medicare Important Message given?   (If response is "NO", the following Medicare IM given date fields will be blank) Date Medicare IM given:   Medicare IM given by:   Date Additional Medicare IM given:   Additional Medicare IM given by:    Discharge Disposition:    Per UR Regulation:  Reviewed for med. necessity/level of care/duration of stay  If discussed at Long Length of Stay Meetings, dates discussed:    Comments:  11/16 0823 debbie Briggett Tuccillo rn,bsn will moniter for dc planning as pt progresses.

## 2014-02-10 NOTE — H&P (Signed)
HPI: [name] is an [age]M with chronic systolic and diastolic heart failure, LVEF 30-35%, s/p bioprosthetic AVR, mod-severe mitral regurgitation, paroxysmal AF, CKD III, HTN, and prior CVA here with an exacerbation of his chronic heart failure.  [name] reports persistent dyspnea since his hospitalization in October.  Over the last 2 days it has it has been constant, including mild dyspnea at rest.  He sleeps on one pillow and endorses orthopnea but denies PND.  His weight has been stable and he denies LE edema.  [name] thinks his SOB may be exacerbated by loud noises or anxiety.  He denies CP or palpitations.  His appetite has been poor and he denies dietary indiscretion.  He has been adhering to a low (~2g/day) sodium diet. His weight has been stable at 169lb. [name] [facility] where his initial BP was 143/48 and HR 96.  He was thought to be in acute on chronic HF due to a BNP of 17K.  He received Lasix 80mg  IV in the ED and desaturated to 89-91% on room air while walking.  They decided to transfer [facility] to [facility] for further evaluation and treatment.  [name] [facility] with afib with RVR and found to have a reduced EF (30-35% from 45% previously) during that admission.  LHC showed moderate non-obstructive disease.  He was diuresed to 173lb and discharged on Lasix 40mg  bid.  He followed up in clinic 10/27 and was thought to be pre-renal with BUN 43, creatinine 1.8, bicarb 33.   Lasix was reduced to 40mg  daily.  His PCP added 20mg  in the evening due to leg edema.     Review of Systems:     Cardiac Review of Systems: {Y] = yes [ ]  = no  Chest Pain [    ]  Resting SOB [ x  ] Exertional SOB  [x  ]  Orthopnea [x  ]   Pedal Edema [   ]    Palpitations [  ] Syncope  [  ]   Presyncope [   ]  General Review of Systems: [Y] = yes [  ]=no Constitional: recent weight change [ x ]; anorexia [  ]; fatigue [  ]; nausea [  ]; night sweats [  ]; fever [   ]; or chills [x  ];                                                                                                                                          Dental: poor dentition[  ]  Eye : blurred vision [  ]; diplopia [   ]; vision changes [  ];  Amaurosis fugax[  ]; Resp: cough [  ];  wheezing[  ];  hemoptysis[  ]; shortness of breath[  ]; paroxysmal nocturnal dyspnea[  ]; dyspnea on exertion[  ];  or orthopnea[  ];  GI:  gallstones[  ], vomiting[  ];  dysphagia[  ]; melena[  ];  hematochezia [  ]; heartburn[  ];   Hx of  Colonoscopy[  ]; GU: kidney stones [  ]; hematuria[  ];   dysuria [  ];  nocturia[  ];  history of     obstruction [  ];                 Skin: rash, swelling[  ];, hair loss[  ];  peripheral edema[  ];  or itching[  ]; Musculosketetal: myalgias[  ];  joint swelling[  ];  joint erythema[  ];  joint pain[  ];  back pain[  ];  Heme/Lymph: bruising[  ];  bleeding[  ];  anemia[  ];  Neuro: TIA[  ];  headaches[  ];  stroke[  ];  vertigo[  ];  seizures[  ];   paresthesias[  ];  difficulty walking[  ];  Psych:depression[  ]; anxiety[x  ];  Endocrine: diabetes[  ];  thyroid dysfunction[  ];  Immunizations: Flu [  ]; Pneumococcal[  ];  Other:  Past Medical History  Diagnosis Date  . Arthritis   . Non-obstructive CAD     a. 12/2013 NSTEMI/Cath: LM nl, LAD 20, LCX 40-50, RCA 20.  Marland Kitchen Hypertension   . Stroke     a. July 2000;  b. January 2007;  c. TIA in 2014.  . CKD (chronic kidney disease), stage III   . GERD (gastroesophageal reflux disease)   . Foot pain, bilateral   . H/O hematuria   . Hyperlipidemia   . Vitamin D deficiency   . Chronic combined systolic and diastolic CHF (congestive heart failure)     a. 12/2013 Echo: EF 30-35%, mod conc LVH, Gr 3 DD, mild AI, mod-sev MR, mod dil LA, mild TR.  Marland Kitchen PAF (paroxysmal atrial fibrillation)     a. CHA2DS2VASc = 7--> Chronic Coumadin.  Marland Kitchen NICM (nonischemic cardiomyopathy)     a. 12/2013 Echo: EF 30-35%.  . Aortic valve  prosthesis present     a. 2006: S/P bioprosthetic AVR.  Marland Kitchen PAD (peripheral artery disease)     a. s/p LLE stenting.  . Carotid arterial disease     a. s/p R CEA  . History of tobacco abuse   . Moderate to Severe Mitral Regurgitation     a. 12/2013 Echo: Mod-Sev MR.    Medications Prior to Admission  Medication Sig Dispense Refill  . acetaminophen (TYLENOL) 500 MG tablet Take 1,000 mg by mouth every 6 (six) hours as needed for mild pain or headache.     . alendronate (FOSAMAX) 70 MG tablet Take 70 mg by mouth once a week. sunday    . allopurinol (ZYLOPRIM) 100 MG tablet Take 200 mg by mouth daily.    Marland Kitchen atorvastatin (LIPITOR) 40 MG tablet Take 40 mg by mouth every evening.    . Calcium Carb-Cholecalciferol (CALCIUM 600 + D PO) Take 2 tablets by mouth every morning.    . clopidogrel (PLAVIX) 75 MG tablet Take 75 mg by mouth daily.    . DULoxetine (CYMBALTA) 30 MG capsule Take 30 mg by mouth daily.    . furosemide (LASIX) 40 MG tablet Take 60 mg by mouth. Take 1 pill (40mg ) in the morning and 0.5 pill (20mg ) in the evening    . metoprolol tartrate (LOPRESSOR) 25 MG tablet Take 1 tablet (25 mg total) by mouth 2 (two) times  daily. 60 tablet 3  . Omega-3 Fatty Acids (FISH OIL) 1200 MG CAPS Take 1 capsule by mouth every morning.    Marland Kitchen omeprazole (PRILOSEC) 40 MG capsule Take 40 mg by mouth every morning.    . warfarin (COUMADIN) 1 MG tablet Take 1 mg by mouth once a week. On Sunday    . warfarin (COUMADIN) 4 MG tablet Take 4 mg by mouth See admin instructions. Monday-Saturday    . traZODone (DESYREL) 50 MG tablet Take 1 tablet (50 mg total) by mouth at bedtime as needed for sleep. 15 tablet 0     Allergies  Allergen Reactions  . Ambien [Zolpidem] Other (See Comments)    Hallucinations  . Ativan [Lorazepam] Other (See Comments)    hallucinations  . Penicillins     Unknown childhood reaction   . Sulfa Antibiotics     unknown    History   Social History  . Marital Status: Married     Spouse Name: N/A    Number of Children: N/A  . Years of Education: N/A   Occupational History  . Not on file.   Social History Main Topics  . Smoking status: Former Smoker    Quit date: 09/26/1998  . Smokeless tobacco: Never Used  . Alcohol Use: 0.6 oz/week    1 Cans of beer per week     Comment: drinks one can of beer daily.  . Drug Use: No  . Sexual Activity: Not on file   Other Topics Concern  . Not on file   Social History Narrative    Family History  Problem Relation Age of Onset  . Stroke Mother   . Cancer Father     PHYSICAL EXAM: Filed Vitals:   02/09/14 2330  BP: 169/46  Pulse: 79  Temp:   Resp: 20   General:  Well appearing. No respiratory difficulty.  Speaking in full sentences.  HEENT: normal Neck: supple. JVP 2cm above clavicle upright.  Carotids 2+ bilat; no bruits. No lymphadenopathy or thryomegaly appreciated. Cor: PMI laterally displaced. Regular rate & rhythm with occasional ectopy. No rubs, gallops.  III/VI holosystolic murmur at the apex. Lungs: Diminished at the R base.  Otherwise clear. Abdomen: soft, nontender, nondistended. No hepatosplenomegaly. No bruits or masses. Good bowel sounds. Extremities: no cyanosis, clubbing, rash, edema.  2+ radial pulses Neuro: alert & oriented x 3, cranial nerves grossly intact. moves all 4 extremities w/o difficulty. Affect pleasant.  ECG: Sinus at 88bpm.  Borderline first degree heart block.  LBBB.  Results for orders placed or performed during the hospital encounter of 02/09/14 (from the past 24 hour(s))  Basic metabolic panel     Status: Abnormal   Collection Time: 02/09/14  5:30 PM  Result Value Ref Range   Sodium 134 (L) 137 - 147 mEq/L   Potassium 5.0 3.7 - 5.3 mEq/L   Chloride 92 (L) 96 - 112 mEq/L   CO2 25 19 - 32 mEq/L   Glucose, Bld 128 (H) 70 - 99 mg/dL   BUN 36 (H) 6 - 23 mg/dL   Creatinine, Ser 3.83 0.50 - 1.35 mg/dL   Calcium 9.6 8.4 - 33.8 mg/dL   GFR calc non Af Amer 50 (L) >90 mL/min     GFR calc Af Amer 58 (L) >90 mL/min   Anion gap 17 (H) 5 - 15  CBC WITH DIFFERENTIAL     Status: Abnormal   Collection Time: 02/09/14  5:30 PM  Result Value Ref Range  WBC 9.1 4.0 - 10.5 K/uL   RBC 4.21 (L) 4.22 - 5.81 MIL/uL   Hemoglobin 14.2 13.0 - 17.0 g/dL   HCT 16.1 09.6 - 04.5 %   MCV 97.1 78.0 - 100.0 fL   MCH 33.7 26.0 - 34.0 pg   MCHC 34.7 30.0 - 36.0 g/dL   RDW 40.9 81.1 - 91.4 %   Platelets 222 150 - 400 K/uL   Neutrophils Relative % 83 (H) 43 - 77 %   Neutro Abs 7.5 1.7 - 7.7 K/uL   Lymphocytes Relative 6 (L) 12 - 46 %   Lymphs Abs 0.5 (L) 0.7 - 4.0 K/uL   Monocytes Relative 9 3 - 12 %   Monocytes Absolute 0.8 0.1 - 1.0 K/uL   Eosinophils Relative 2 0 - 5 %   Eosinophils Absolute 0.2 0.0 - 0.7 K/uL   Basophils Relative 0 0 - 1 %   Basophils Absolute 0.0 0.0 - 0.1 K/uL  Troponin I (MHP)     Status: None   Collection Time: 02/09/14  5:30 PM  Result Value Ref Range   Troponin I <0.30 <0.30 ng/mL  Pro b natriuretic peptide     Status: Abnormal   Collection Time: 02/09/14  5:30 PM  Result Value Ref Range   Pro B Natriuretic peptide (BNP) 17899.0 (H) 0 - 450 pg/mL  Protime-INR     Status: Abnormal   Collection Time: 02/09/14  5:30 PM  Result Value Ref Range   Prothrombin Time 44.2 (H) 11.6 - 15.2 seconds   INR 4.70 (H) 0.00 - 1.49  Urinalysis, Routine w reflex microscopic     Status: Abnormal   Collection Time: 02/09/14  8:30 PM  Result Value Ref Range   Color, Urine YELLOW YELLOW   APPearance CLEAR CLEAR   Specific Gravity, Urine 1.011 1.005 - 1.030   pH 5.5 5.0 - 8.0   Glucose, UA NEGATIVE NEGATIVE mg/dL   Hgb urine dipstick NEGATIVE NEGATIVE   Bilirubin Urine NEGATIVE NEGATIVE   Ketones, ur 15 (A) NEGATIVE mg/dL   Protein, ur NEGATIVE NEGATIVE mg/dL   Urobilinogen, UA 0.2 0.0 - 1.0 mg/dL   Nitrite NEGATIVE NEGATIVE   Leukocytes, UA NEGATIVE NEGATIVE  MRSA PCR Screening     Status: None   Collection Time: 02/09/14 10:40 PM  Result Value Ref Range    MRSA by PCR NEGATIVE NEGATIVE   Dg Chest 2 View  02/09/2014   CLINICAL DATA:  Shortness of breath  EXAM: CHEST  2 VIEW  COMPARISON:  01/12/2014  FINDINGS: Postsurgical changes are again seen. The cardiac shadow is stable. Small bilateral pleural effusions are noted as well as fluid trapped within the minor fissure on the right. Mild vascular congestion with interstitial edema is noted. The overall appearance of the congestion is improved when compare with the prior exam however.  IMPRESSION: CHF with small bilateral pleural effusions and fluid trapped within the minor fissure on the right.   Electronically Signed   By: Alcide Clever M.D.   On: 02/09/2014 17:48    TTE 01/12/14: EF 30-35%, grade 3 diastolic dysfunction.  Global hypokinesis with akinesis of the basal and mid inferolateral walls.  LV cavity moderately dilated.  Moderate LVH.  Bioprosthetic AVR functioning normally.  Mild central AR.  Moderate to severe MR, structurally normal valve.  LA moderately enlarged.  RV function moderately reduced.  Mild TR.  02/2013: EF was 40-45% and MR was mild.   ASSESSMENT: 65M with chronic systolic and diastolic heart  failure, LVEF 30-35%, s/p bioprosthetic AVR, mod-severe mitral regurgitation, paroxysmal AF, CKD III, HTN, and prior CVA here with an exacerbation of his chronic heart failure.    PLAN/DISCUSSION: # Acute on chronic systolic and diastolic HF: Patient has NYHA class III-IV symptoms.  Presumably it is ischemic in etiology given the wall motion abnormalities on echo.  No lesions for intervention on cath last month.  He has not started an optimal medication regimen due to concerns about his fluctuating renal function.   - Continue diuresis with IV lasix - Try to start low dose ACE-I prior to discharge if renal function stays stable - Continue metoprolol 25 mg bid.  Would switch to succinate if BP tolerates. - Given LBBB, progressive HF and worsening MR, consider CRT if EF does not improve after  adding ACE-I  # Mod-severe MR: Likely secondary given wall motion abnormalities and a structurally normal valve.  With a calculated regurgitant volume of 90ml and ERO 0.69cm^2, the MR seems more severe than moderate, though I am limited by my inability to actually view the echo images.  Given this, his symptoms, reduced EF and dilated LV (LVID ES 56mm), Mr. Jourdan could be considered for mitral valve replacement/repair.  However his age and DNAR status make this unlikely. - optimize HF as above - Consider CRT-P given that he is DNAR  # Atrial fibrillation: Currently in sinus rhythm.  Patient had a 21 day event monitor placed in early November.  It was removed at [facility] and his wife took it home.  Anticoagulated with warfarin.  INR 4.7 - Continue metoprolol for rate control.  Should consolidate to metoprolol succinate for HF regimen if he continues to tolerate this dose as an inpatient. - Hold warfarin until INR <3 - Follow up on 21 day Holter  # s/p AVR: Valve stable on echo 12/2013.  # Prior CVA, PVD: Stable. - Continue plavix and statin.  # Code: DNAR

## 2014-02-10 NOTE — Progress Notes (Signed)
eLink Physician-Brief Progress Note Patient Name: Cosmos Rauschenbach DOB: 01-21-1931 MRN: [id number]   Date of Service  02/10/2014  HPI/Events of Note  Best Practice  eICU Interventions  SCDs ordered while on bedrest with BR priv     Intervention Category Intermediate Interventions: Best-practice therapies (e.g. DVT, beta blocker, etc.)  DETERDING,ELIZABETH 02/10/2014, 3:29 AM

## 2014-02-10 NOTE — Progress Notes (Signed)
INITIAL NUTRITION ASSESSMENT  DOCUMENTATION CODES Per approved criteria  -Not Applicable   INTERVENTION: Ensure Complete po PRN, each supplement provides 350 kcal and 13 grams of protein  NUTRITION DIAGNOSIS: Inadequate oral intake related to decreased appetite as evidenced by diet hx PTA.   Goal: Pt will meet >90% of estimated nutritional needs  Monitor:  PO/supplement intake, labs, weight changes, I/O's  Reason for Assessment: MST=2  80 y.o. male  Admitting Dx: <principal problem not specified>  Timothy Durham is an 23M with chronic systolic and diastolic heart failure, LVEF 30-35%, s/p bioprosthetic AVR, mod-severe mitral regurgitation, paroxysmal AF, CKD III, HTN, and prior CVA here with an exacerbation of his chronic heart failure. Timothy Durham reports persistent dyspnea since his hospitalization in October. Over the last 2 days it has it has been constant, including mild dyspnea at rest. He sleeps on one pillow and endorses orthopnea but denies PND. His weight has been stable and he denies LE edema. Timothy Durham thinks his SOB may be exacerbated by loud noises or anxiety. He denies CP or palpitations. His appetite has been poor and he denies dietary indiscretion. He has been adhering to a low (~2g/day) sodium diet.   ASSESSMENT: Pt admitted with heart failure.  Hx obtained by pt and wife at bedside. They are very knowledgeable about a low sodium diet and are very compliant with diet restrictions at home.  Pt reports variable appetite over the past month, after his last hospital discharge. Per pt wife, he will eat well one day, but eat poorly the next. She reveals that she "forces" him to drink Boost when he eats poorly at meals. Pt reports that he does not care for the taste of supplement. Offered other options on formulary, however, pt refused. Pt and wife agreeable to PRN order, so he can drink when asked.  Pt reports he ate a few bites of eggs this morning at breakfast. He  reveals that he is very excited for his lunch meal (grilled chicken sandwich) and he always eats those well when he is hospitalized.  Pt and wife report most of weight loss is due to fluid. Reports UBW of 180#, but dry weight around 169-172#.  Nutrition focused physical exam reveals no signs of fat or muscle depletion.  Labs reviewed. Na: 135, Cl: 93, BUN/Creat: 33/1.37.   Height: Ht Readings from Last 1 Encounters:  02/09/14 5\' 11"  (1.803 m)    Weight: Wt Readings from Last 1 Encounters:  02/10/14 165 lb 5.5 oz (75 kg)    Ideal Body Weight: 172#  % Ideal Body Weight: 96%  Wt Readings from Last 10 Encounters:  02/10/14 165 lb 5.5 oz (75 kg)  01/31/14 174 lb 8 oz (79.153 kg)  01/21/14 172 lb (78.019 kg)  01/16/14 173 lb 8 oz (78.7 kg)  03/26/13 181 lb (82.101 kg)    Usual Body Weight: 180#  % Usual Body Weight: 92%  BMI:  Body mass index is 23.07 kg/(m^2). Normal weight range  Estimated Nutritional Needs: Kcal: 1900-2100 Protein: 83-93 grams Fluid: 1.9-2.1 L  Skin: WDL  Diet Order: Diet 2 gram sodium  EDUCATION NEEDS: -No education needs identified at this time   Intake/Output Summary (Last 24 hours) at 02/10/14 1028 Last data filed at 02/10/14 0045  Gross per 24 hour  Intake    203 ml  Output    520 ml  Net   -317 ml    Last BM: 02/09/14  Labs:   Recent Labs Lab 02/09/14 1730  02/10/14 0233  NA 134* 135*  K 5.0 3.9  CL 92* 93*  CO2 25 27  BUN 36* 33*  CREATININE 1.30 1.37*  CALCIUM 9.6 8.7  GLUCOSE 128* 98    CBG (last 3)  No results for input(s): GLUCAP in the last 72 hours.  Scheduled Meds: . allopurinol  200 mg Oral Daily  . atorvastatin  40 mg Oral QPM  . calcium-vitamin D  2 tablet Oral Q breakfast  . clopidogrel  75 mg Oral Daily  . furosemide  60 mg Intravenous Daily  . [START ON 02/11/2014] furosemide  40 mg Oral BID  . metoprolol tartrate  25 mg Oral BID  . pantoprazole  40 mg Oral Daily  . sodium chloride  3 mL Intravenous  Q12H    Continuous Infusions:   Past Medical History  Diagnosis Date  . Arthritis   . Non-obstructive CAD     a. 12/2013 NSTEMI/Cath: LM nl, LAD 20, LCX 40-50, RCA 20.  Marland Kitchen. Hypertension   . Stroke     a. July 2000;  b. January 2007;  c. TIA in 2014.  . CKD (chronic kidney disease), stage III   . GERD (gastroesophageal reflux disease)   . Foot pain, bilateral   . H/O hematuria   . Hyperlipidemia   . Vitamin D deficiency   . Chronic combined systolic and diastolic CHF (congestive heart failure)     a. 12/2013 Echo: EF 30-35%, mod conc LVH, Gr 3 DD, mild AI, mod-sev MR, mod dil LA, mild TR.  Marland Kitchen. PAF (paroxysmal atrial fibrillation)     a. CHA2DS2VASc = 7--> Chronic Coumadin.  Marland Kitchen. NICM (nonischemic cardiomyopathy)     a. 12/2013 Echo: EF 30-35%.  . Aortic valve prosthesis present     a. 2006: S/P bioprosthetic AVR.  Marland Kitchen. PAD (peripheral artery disease)     a. s/p LLE stenting.  . Carotid arterial disease     a. s/p R CEA  . History of tobacco abuse   . Moderate to Severe Mitral Regurgitation     a. 12/2013 Echo: Mod-Sev MR.    Past Surgical History  Procedure Laterality Date  . Aortic valve replacement (avr)/coronary artery bypass grafting (cabg)  12/2008  . Eye surgery    . Carotid angiogram  1996  . Coronary artery bypass graft    . Left lower extremity stent    . Carotid endarterectomy      Kedric Bumgarner A. Mayford KnifeWilliams, RD, LDN Pager: [phone] After hours Pager: [phone]

## 2014-02-10 NOTE — Consult Note (Signed)
Reason for Consult:   ? Biv pacer candidate  Requesting Physician: Dr Katrinka Blazing  HPI: This is a 80 y.o. male with a past medical history significant for prior CVA in 2000 felt to be secondary to AF. He has been on chronic anticoagulation. He has had subsequent TIAs in 2007 and in Dec 2014, Plavix was added. He is s/p tissue AVR in 2006 in [place]. He had been followed by a cardiologist in [facility]. We saw the pt in Oct when he presented with acute CHF in setting of rapid AF. He was diuresed and he converted after Diltiazem was ordered. Echo 01/12/14 showed a drop in his AF to 30-35% (previous echo in Dec 2014- 40-45%). He underwent Rt and Lt heart cath 01/14/14 and this showed moderate non obstructive CAD with a 50% CFX and mild pulmonary HTN. He was discharged 01/16/14. His diuretics were decreased as an OP secondary to worsening renal function. He came back to the ER 02/09/14 with SOB. His BNP was 17k. He has been in NSR. He has improved since admission although no significant diuresis or wgt change recorded. Dr Ladona Ridgel is asked to see for BiV Pacemaker evaluation. The pt has a LBBB. He and his wife state he has been doing well October this year. Prior to that he had been driving and doing light yard work.   PMHx:  Past Medical History  Diagnosis Date  . Arthritis   . Non-obstructive CAD     a. 12/2013 NSTEMI/Cath: LM nl, LAD 20, LCX 40-50, RCA 20.  Marland Kitchen Hypertension   . Stroke     a. July 2000;  b. January 2007;  c. TIA in 2014.  . CKD (chronic kidney disease), stage III   . GERD (gastroesophageal reflux disease)   . Foot pain, bilateral   . H/O hematuria   . Hyperlipidemia   . Vitamin D deficiency   . Chronic combined systolic and diastolic CHF (congestive heart failure)     a. 12/2013 Echo: EF 30-35%, mod conc LVH, Gr 3 DD, mild AI, mod-sev MR, mod dil LA, mild TR.  Marland Kitchen PAF (paroxysmal atrial fibrillation)     a. CHA2DS2VASc = 7--> Chronic Coumadin.  Marland Kitchen NICM (nonischemic  cardiomyopathy)     a. 12/2013 Echo: EF 30-35%.  . Aortic valve prosthesis present     a. 2006: S/P bioprosthetic AVR.  Marland Kitchen PAD (peripheral artery disease)     a. s/p LLE stenting.  . Carotid arterial disease     a. s/p R CEA  . History of tobacco abuse   . Moderate to Severe Mitral Regurgitation     a. 12/2013 Echo: Mod-Sev MR.    Past Surgical History  Procedure Laterality Date  . Aortic valve replacement (avr)/coronary artery bypass grafting (cabg)  12/2008  . Eye surgery    . Carotid angiogram  1996  . Coronary artery bypass graft    . Left lower extremity stent    . Carotid endarterectomy      SOCHx:  reports that he quit smoking about 15 years ago. He has never used smokeless tobacco. He reports that he drinks about 0.6 oz of alcohol per week. He reports that he does not use illicit drugs.  FAMHx: Family History  Problem Relation Age of Onset  . Stroke Mother   . Cancer Father     ALLERGIES: Allergies  Allergen Reactions  . Ambien [Zolpidem] Other (See Comments)    Hallucinations  .  Ativan [Lorazepam] Other (See Comments)    hallucinations  . Penicillins     Unknown childhood reaction   . Sulfa Antibiotics     unknown    ROS: See H&P for complete ROS  HOME MEDICATIONS: Prior to Admission medications   Medication Sig Start Date End Date Taking? Authorizing Provider  acetaminophen (TYLENOL) 500 MG tablet Take 1,000 mg by mouth every 6 (six) hours as needed for mild pain or headache.    Yes Historical Provider, MD  alendronate (FOSAMAX) 70 MG tablet Take 70 mg by mouth once a week. sunday 02/28/13  Yes Historical Provider, MD  allopurinol (ZYLOPRIM) 100 MG tablet Take 200 mg by mouth daily.   Yes Historical Provider, MD  atorvastatin (LIPITOR) 40 MG tablet Take 40 mg by mouth every evening.   Yes Historical Provider, MD  Calcium Carb-Cholecalciferol (CALCIUM 600 + D PO) Take 2 tablets by mouth every morning.   Yes Historical Provider, MD  clopidogrel (PLAVIX)  75 MG tablet Take 75 mg by mouth daily.   Yes Historical Provider, MD  DULoxetine (CYMBALTA) 30 MG capsule Take 30 mg by mouth daily.   Yes Historical Provider, MD  furosemide (LASIX) 40 MG tablet Take 20-40 mg by mouth 2 (two) times daily. Take 1 pill (40mg ) in the morning and 0.5 pill (20mg ) in the evening   Yes Historical Provider, MD  metoprolol tartrate (LOPRESSOR) 25 MG tablet Take 1 tablet (25 mg total) by mouth 2 (two) times daily. 01/16/14  Yes Osvaldo Shipper, MD  Omega-3 Fatty Acids (FISH OIL) 1200 MG CAPS Take 1 capsule by mouth every morning.   Yes Historical Provider, MD  omeprazole (PRILOSEC) 40 MG capsule Take 40 mg by mouth every morning.   Yes Historical Provider, MD  warfarin (COUMADIN) 1 MG tablet Take 1 mg by mouth once a week. On Sunday   Yes Historical Provider, MD  warfarin (COUMADIN) 4 MG tablet Take 4 mg by mouth See admin instructions. Monday-Saturday 02/24/13  Yes Historical Provider, MD    HOSPITAL MEDICATIONS: I have reviewed the patient's current medications.  VITALS: Blood pressure 148/39, pulse 71, temperature 98.3 F (36.8 C), temperature source Oral, resp. rate 16, height 5\' 11"  (1.803 m), weight 165 lb 5.5 oz (75 kg), SpO2 89 %.  PHYSICAL EXAM: General appearance: alert, cooperative, appears stated age and no distress Neck: no carotid bruit and no JVD Lungs: few crackles Rt base Heart: regular rate and rhythm and 2/6 systolic murmur AOV area Abdomen: soft, non-tender; bowel sounds normal; no masses,  no organomegaly Extremities: extremities normal, atraumatic, no cyanosis or edema Pulses: 2+ and symmetric Skin: Skin color, texture, turgor normal. No rashes or lesions Neurologic: Grossly normal  LABS: Results for orders placed or performed during the hospital encounter of 02/09/14 (from the past 24 hour(s))  Basic metabolic panel     Status: Abnormal   Collection Time: 02/09/14  5:30 PM  Result Value Ref Range   Sodium 134 (L) 137 - 147 mEq/L    Potassium 5.0 3.7 - 5.3 mEq/L   Chloride 92 (L) 96 - 112 mEq/L   CO2 25 19 - 32 mEq/L   Glucose, Bld 128 (H) 70 - 99 mg/dL   BUN 36 (H) 6 - 23 mg/dL   Creatinine, Ser 1.61 0.50 - 1.35 mg/dL   Calcium 9.6 8.4 - 09.6 mg/dL   GFR calc non Af Amer 50 (L) >90 mL/min   GFR calc Af Amer 58 (L) >90 mL/min   Anion gap  17 (H) 5 - 15  CBC WITH DIFFERENTIAL     Status: Abnormal   Collection Time: 02/09/14  5:30 PM  Result Value Ref Range   WBC 9.1 4.0 - 10.5 K/uL   RBC 4.21 (L) 4.22 - 5.81 MIL/uL   Hemoglobin 14.2 13.0 - 17.0 g/dL   HCT 16.1 09.6 - 04.5 %   MCV 97.1 78.0 - 100.0 fL   MCH 33.7 26.0 - 34.0 pg   MCHC 34.7 30.0 - 36.0 g/dL   RDW 40.9 81.1 - 91.4 %   Platelets 222 150 - 400 K/uL   Neutrophils Relative % 83 (H) 43 - 77 %   Neutro Abs 7.5 1.7 - 7.7 K/uL   Lymphocytes Relative 6 (L) 12 - 46 %   Lymphs Abs 0.5 (L) 0.7 - 4.0 K/uL   Monocytes Relative 9 3 - 12 %   Monocytes Absolute 0.8 0.1 - 1.0 K/uL   Eosinophils Relative 2 0 - 5 %   Eosinophils Absolute 0.2 0.0 - 0.7 K/uL   Basophils Relative 0 0 - 1 %   Basophils Absolute 0.0 0.0 - 0.1 K/uL  Troponin I (MHP)     Status: None   Collection Time: 02/09/14  5:30 PM  Result Value Ref Range   Troponin I <0.30 <0.30 ng/mL  Pro b natriuretic peptide     Status: Abnormal   Collection Time: 02/09/14  5:30 PM  Result Value Ref Range   Pro B Natriuretic peptide (BNP) 17899.0 (H) 0 - 450 pg/mL  Protime-INR     Status: Abnormal   Collection Time: 02/09/14  5:30 PM  Result Value Ref Range   Prothrombin Time 44.2 (H) 11.6 - 15.2 seconds   INR 4.70 (H) 0.00 - 1.49  Urinalysis, Routine w reflex microscopic     Status: Abnormal   Collection Time: 02/09/14  8:30 PM  Result Value Ref Range   Color, Urine YELLOW YELLOW   APPearance CLEAR CLEAR   Specific Gravity, Urine 1.011 1.005 - 1.030   pH 5.5 5.0 - 8.0   Glucose, UA NEGATIVE NEGATIVE mg/dL   Hgb urine dipstick NEGATIVE NEGATIVE   Bilirubin Urine NEGATIVE NEGATIVE   Ketones, ur 15  (A) NEGATIVE mg/dL   Protein, ur NEGATIVE NEGATIVE mg/dL   Urobilinogen, UA 0.2 0.0 - 1.0 mg/dL   Nitrite NEGATIVE NEGATIVE   Leukocytes, UA NEGATIVE NEGATIVE  MRSA PCR Screening     Status: None   Collection Time: 02/09/14 10:40 PM  Result Value Ref Range   MRSA by PCR NEGATIVE NEGATIVE  Basic metabolic panel     Status: Abnormal   Collection Time: 02/10/14  2:33 AM  Result Value Ref Range   Sodium 135 (L) 137 - 147 mEq/L   Potassium 3.9 3.7 - 5.3 mEq/L   Chloride 93 (L) 96 - 112 mEq/L   CO2 27 19 - 32 mEq/L   Glucose, Bld 98 70 - 99 mg/dL   BUN 33 (H) 6 - 23 mg/dL   Creatinine, Ser 7.82 (H) 0.50 - 1.35 mg/dL   Calcium 8.7 8.4 - 95.6 mg/dL   GFR calc non Af Amer 47 (L) >90 mL/min   GFR calc Af Amer 54 (L) >90 mL/min   Anion gap 15 5 - 15  Protime-INR     Status: Abnormal   Collection Time: 02/10/14  2:33 AM  Result Value Ref Range   Prothrombin Time 39.6 (H) 11.6 - 15.2 seconds   INR 4.04 (H) 0.00 - 1.49  EKG: NSR LBBB  IMAGING: Dg Chest 2 View  02/09/2014   CLINICAL DATA:  Shortness of breath  EXAM: CHEST  2 VIEW  COMPARISON:  01/12/2014  FINDINGS: Postsurgical changes are again seen. The cardiac shadow is stable. Small bilateral pleural effusions are noted as well as fluid trapped within the minor fissure on the right. Mild vascular congestion with interstitial edema is noted. The overall appearance of the congestion is improved when compare with the prior exam however.  IMPRESSION: CHF with small bilateral pleural effusions and fluid trapped within the minor fissure on the right.   Electronically Signed   By: Alcide Clever M.D.   On: 02/09/2014 17:48   Echo: 01/12/14 Study Conclusions  - Left ventricle: There is global hypokinesis with akinesis of the basal and mid inferolateral walls. The cavity size was moderately dilated. There was moderate concentric hypertrophy. Systolic function was moderately to severely reduced. The estimated ejection fraction was in  the range of 30% to 35%. Doppler parameters are consistent with a restrictive pattern, indicative of decreased left ventricular diastolic compliance and/or increased left atrial pressure (grade 3 diastolic dysfunction). Doppler parameters are consistent with elevated ventricular end-diastolic filling pressure. - Aortic valve: Bioprosthetic valve seems to be functioning normally. Normal transaortic gradients. There is mild central regurgitation and no paravalvular leak. There was mild regurgitation directed eccentrically in the LVOT. Valve area (Vmax): 0.96 cm^2. - Aorta: The aorta was normal, not dilated, and non-diseased. Aortic root dimension: 40 mm (ED). - Mitral valve: There was moderate to severe regurgitation. - Left atrium: The atrium was moderately dilated. - Right ventricle: Systolic function was moderately reduced. - Atrial septum: No defect or patent foramen ovale was identified. - Tricuspid valve: There was mild regurgitation.  Impressions:  - When compared to the prior study from 03/26/2013 the left ventricle is now moderately dilated with moderate to severe systolic dysfunction and estimated LVEF 30-35%, previously 40-45%. There is restrictive pattern of diastolic dysfunction with elevated filling pressures. Mitral regurgitation is moderate to severe, previosuly mild. Right ventricular systolic function is moderately reduced.  IMPRESSION: Principal Problem:   Acute on chronic combined systolic and diastolic congestive heart failure, NYHA class 3 Active Problems:   NICM (nonischemic cardiomyopathy)   TIA 2007, Dec 2014 (Plavix added)   CKD (chronic kidney disease), stage III   PAF- recurrent AF with RVR Oct 2015- NSR now   Hypertension   Moderate to Severe Mitral Regurgitation   PAD- LLE PTA, RCEA, attempted LICA stent   Tissue AVR 2006 (in [place])   Hyperlipidemia   LBBB (left bundle branch block)   Chronic anticoagulation    History of CVA- Rt brain 2000   RECOMMENDATION: Will review with Dr Ladona Ridgel. ? Biv pacemaker candidate with recent drop in his EF with recurrent CHF and chronic LBBB. The pt is DNR (his request). He has a had prior stroke but apparently has no significant residual deficits.   Time Spent Directly with Patient: 45 minutes  Timothy Durham, Timothy Durham [phone] beeper 02/10/2014, 2:06 PM    EP Attending  Patient seen and examined. Agree with Mr. Fly note. He is a candidate for BiV PPM as he has been admitted twice with worsening heart failure, once with uncontrolled atrial fib and now in NSR. I would suggest he be allowed to be discharged home and come back to have this done electively. Note he is on both plavix and coumadin and INR today is 3. Current anti-coagulation status would make [place] require holding both plavix  and Coumadin. Will arrange for outpatient device implant.  Leonia ReevesGregg Taylor,M.D.

## 2014-02-10 NOTE — Progress Notes (Addendum)
       Patient Name: Timothy Durham Date of Encounter: 02/10/2014   SUBJECTIVE:  The patient is doing well/better than on admission. He has no palpitations. He denies chest pain. Breathing is improved since admission.  TELEMETRY:  Normal sinus rhythm on monitor with heart rates in the 80 beat per minute range. Filed Vitals:   02/10/14 0941 02/10/14 1000 02/10/14 1115 02/10/14 1200  BP:  139/40 167/61 148/39  Pulse: 77 77 67 71  Temp:   98.3 F (36.8 C)   TempSrc:   Oral   Resp:  18 18 16   Height:      Weight:      SpO2:  92% 92% 89%    Intake/Output Summary (Last 24 hours) at 02/10/14 1243 Last data filed at 02/10/14 0745  Gross per 24 hour  Intake    203 ml  Output    520 ml  Net   -317 ml   LABS: Basic Metabolic Panel:  Recent Labs  03/49/17 1730 02/10/14 0233  NA 134* 135*  K 5.0 3.9  CL 92* 93*  CO2 25 27  GLUCOSE 128* 98  BUN 36* 33*  CREATININE 1.30 1.37*  CALCIUM 9.6 8.7   CBC:  Recent Labs  02/09/14 1730  WBC 9.1  NEUTROABS 7.5  HGB 14.2  HCT 40.9  MCV 97.1  PLT 222   Cardiac Enzymes:  Recent Labs  02/09/14 1730  TROPONINI <0.30    Radiology/Studies:  CXR:  IMPRESSION: CHF with small bilateral pleural effusions and fluid trapped within the minor fissure on the right.  Physical Exam: Blood pressure 148/39, pulse 71, temperature 98.3 F (36.8 C), temperature source Oral, resp. rate 16, height 5\' 11"  (1.803 m), weight 165 lb 5.5 oz (75 kg), SpO2 89 %. Weight change: 4 lb 10.5 oz (2.111 kg)  Wt Readings from Last 3 Encounters:  02/10/14 165 lb 5.5 oz (75 kg)  01/31/14 174 lb 8 oz (79.153 kg)  01/21/14 172 lb (78.019 kg)   Neck exam reveals no JVD. Chest is clear to auscultation and percussion with the exception of diminished sounds at the bases bilateral. Cardiac exam reveals no rub, click, or murmur. He is status post median sternotomy. There is a 2 to 3/6 systolic murmur right upper sternal border. No diastolic murmur. An apical  systolic murmur is also audible. Extremities reveal no edema   ASSESSMENT:  1. Acute on chronic systolic heart failure, improving with IV diuresis 2. Paroxysmal atrial fibrillation, currently normal sinus rhythm 3. Chronic kidney disease, stage III, stable 4. Status post aortic valve replacement, normally functioning bioprosthesis 5. Moderate to severe mitral regurgitation 6. Left bundle branch block with current duration 156 ms  Plan:  1. IV diuresis 2. Monitor renal function, but we may have to accept a higher creatinine to keep the patient out of heart failure. 3. Consider AICD and resynchronization therapy. Resynchronization may significantly improve heart failure symptoms 4. Resume outpatient Cymbalta 5. The oral dose of furosemide will need to be adjusted based upon clinical response. Her goal should be to clear heart failure  Signed, Lesleigh Noe 02/10/2014, 12:43 PM

## 2014-02-10 NOTE — Plan of Care (Signed)
Problem: Phase I Progression Outcomes Goal: Dyspnea controlled at rest (HF) Outcome: Progressing Goal: Pain controlled with appropriate interventions Outcome: Completed/Met Date Met:  02/10/14 Goal: EF % per last Echo/documented,Core Reminder form on chart Outcome: Completed/Met Date Met:  02/10/14 Goal: Up in chair, BRP Outcome: Completed/Met Date Met:  02/10/14 Goal: Initial discharge plan identified Outcome: Progressing Goal: Voiding-avoid urinary catheter unless indicated Outcome: Completed/Met Date Met:  02/10/14 Goal: Hemodynamically stable Outcome: Completed/Met Date Met:  02/10/14

## 2014-02-11 ENCOUNTER — Encounter: Payer: Self-pay | Admitting: *Deleted

## 2014-02-11 ENCOUNTER — Telehealth: Payer: Self-pay | Admitting: Cardiology

## 2014-02-11 LAB — BASIC METABOLIC PANEL
Anion gap: 13 (ref 5–15)
BUN: 35 mg/dL — ABNORMAL HIGH (ref 6–23)
CALCIUM: 9.1 mg/dL (ref 8.4–10.5)
CO2: 30 mEq/L (ref 19–32)
Chloride: 94 mEq/L — ABNORMAL LOW (ref 96–112)
Creatinine, Ser: 1.4 mg/dL — ABNORMAL HIGH (ref 0.50–1.35)
GFR calc Af Amer: 53 mL/min — ABNORMAL LOW (ref >90)
GFR, EST NON AFRICAN AMERICAN: 46 mL/min — AB (ref >90)
GLUCOSE: 88 mg/dL (ref 70–99)
Potassium: 3.8 mEq/L (ref 3.7–5.3)
SODIUM: 137 meq/L (ref 137–147)

## 2014-02-11 LAB — PROTIME-INR
INR: 3.02 — ABNORMAL HIGH (ref 0.00–1.49)
PROTHROMBIN TIME: 31.5 s — AB (ref 11.6–15.2)

## 2014-02-11 MED ORDER — FUROSEMIDE 80 MG PO TABS: 40.0000 mg | ORAL_TABLET | Freq: Two times a day (BID) | ORAL | Status: DC

## 2014-02-11 MED ORDER — FUROSEMIDE 80 MG PO TABS: 80.0000 mg | ORAL_TABLET | Freq: Two times a day (BID) | ORAL | Status: DC

## 2014-02-11 NOTE — Evaluation (Signed)
Physical Therapy Evaluation Patient Details Name: Timothy CanterburyRoy Durham MRN: [id number] DOB: May 04, 1930 Today's Date: 02/11/2014   History of Present Illness  80 y.o. male with PMH of HTN, CKD, PAD s/p stent (plavix), CVA with residual L sided weakness, s/p AVR, A fib (coumadin), CHF (not on diuretics x 6 month) presented progressive SOB, DOE, some PNDs for few days, associated with mild leg edema and found to have acute CHF, A fib RVR in ED. He was admitted to telemetry. Cardiology consulted. Underwent cardiac catheterization on 10/20.    Clinical Impression  Pt is deemed to be at or close to baseline as told by patient and wife. Pt is supervision for all mobility tasks and is safe and able to take challenges with mobility. Pt's spouse will be present to help patient 24/7 at home if Pt needs any help. Pt able to walk 600 feet without AD and on RA. Pt desat to 87% after negotiating 3 stairs but was able to increase to 91% with rest. Pt's SaO2 remained between 91-95% for remainder of session. Pt did not complain of dizziness during session. Pt's wife informed PT that he will be receiving a pacemaker on Monday and will need to follow precautions for 1 month following pacemaker surgery. PT educated pt and wife on importance of maintaining those precautions and continuing to ambulate around the house for cardiovascular exercises. Pt is discharged from acute PT and does not require further PT follow up after discharge. Due to discharge, no goals have been made.     Follow Up Recommendations No PT follow up    Equipment Recommendations  None recommended by PT    Recommendations for Other Services       Precautions / Restrictions Precautions Precautions: Fall Restrictions Weight Bearing Restrictions: No      Mobility  Bed Mobility                  Transfers Overall transfer level: Independent Equipment used: None             General transfer comment: Pt able to peform sit to stand from  recliner chair without PT assistance or cues.   Ambulation/Gait Ambulation/Gait assistance: Supervision Ambulation Distance (Feet): 600 Feet Assistive device: None Gait Pattern/deviations: Step-through pattern;Narrow base of support   Gait velocity interpretation: at or above normal speed for age/gender General Gait Details: Pt able to ambulate in hall with challenge with no loss of balance. Pt ambulated without AD and on RA. Pt SaO2 decreased to 87% after negotiating 3 stairs but was able to increase to low 90s with rest. Pt maintained SaO2 within 90-95% for ambulation without challenge.    Stairs Stairs: Yes Stairs assistance: Supervision Stair Management: One rail Left;Alternating pattern;Forwards Number of Stairs: 3 General stair comments: Pt able to safely negotiate 3 stairs using Right rail with no physical assist or cuing from PT.   Wheelchair Mobility    Modified Rankin (Stroke Patients Only)       Balance                                             Pertinent Vitals/Pain Pain Assessment: No/denies pain  BP at end of session post-ambulation: 128/42 SaO2 with ambulation: 91-95% SaO2 after negotiating 3 stairs: 87-91%    Home Living Family/patient expects to be discharged to:: Private residence Living Arrangements: Spouse/significant other Available Help at  Discharge: Family;Available 24 hours/day Type of Home: House Home Access: Level entry     Home Layout: One level Home Equipment: None      Prior Function Level of Independence: Independent               Hand Dominance   Dominant Hand: Left    Extremity/Trunk Assessment               Lower Extremity Assessment: Overall WFL for tasks assessed         Communication   Communication: No difficulties  Cognition Arousal/Alertness: Awake/alert Behavior During Therapy: WFL for tasks assessed/performed Overall Cognitive Status: Within Functional Limits for tasks assessed                       General Comments      Exercises  Pt educated on general exercises and stretches to perform post pacemaker surgery next week. Pt educated on importance of maintaining pacemaker precautions and not performing any exercises that break these precautions. Pt and spouse stated verbal understanding.       Assessment/Plan    PT Assessment Patent does not need any further PT services  PT Diagnosis Difficulty walking   PT Problem List    PT Treatment Interventions     PT Goals (Current goals can be found in the Care Plan section)      Frequency     Barriers to discharge        Co-evaluation               End of Session Equipment Utilized During Treatment: Gait belt Activity Tolerance: Patient tolerated treatment well Patient left: in chair;with family/visitor present;with call bell/phone within reach           Time: 1000-1023 PT Time Calculation (min) (ACUTE ONLY): 23 min   Charges:   PT Evaluation $Initial PT Evaluation Tier I: 1 Procedure PT Treatments $Gait Training: 8-22 mins   PT G CodesYork Spaniel, SPT 02/11/2014, 10:38 AM   York Spaniel, SPT  Acute Rehabilitation [phone]

## 2014-02-11 NOTE — Discharge Summary (Signed)
Physician Discharge Summary      Cardiologist:   Patient ID: Timothy Durham MRN: [id number] DOB/AGE: [birth year] 78 y.o.  Admit date: 02/09/2014 Discharge date: 02/11/2014  Admission Diagnoses:  Acute on chronic combined systolic and diastolic congestive heart failure, NYHA class 3  Discharge Diagnoses:  Principal Problem:   Acute on chronic combined systolic and diastolic congestive heart failure, NYHA class 3 Active Problems:   TIA 2007, Dec 2014 (Plavix added)   Hypertension   CKD (chronic kidney disease), stage III   Moderate to Severe Mitral Regurgitation   PAD- LLE PTA, RCEA, attempted LICA stent   Tissue AVR 2006 (in [place])   NICM (nonischemic cardiomyopathy)   PAF- recurrent AF with RVR Oct 2015- NSR now   Hyperlipidemia   LBBB (left bundle branch block)   History of CVA- Rt brain 2000   Chronic anticoagulation   Discharged Condition: stable  Hospital Course:   Timothy Durham is an 80M with chronic systolic and diastolic heart failure, LVEF 30-35%, s/p bioprosthetic AVR, mod-severe mitral regurgitation, paroxysmal AF, CKD III, HTN, and prior CVA here with an exacerbation of his chronic heart failure. He was admitted to [facility] 10/14 with afib with RVR and found to have a reduced EF (30-35% from 45% previously) during that admission. LHC showed moderate non-obstructive disease. He was diuresed to 173lb and discharged on Lasix 40mg  bid. He followed up in clinic 10/27 and was thought to be pre-renal with BUN 43, creatinine 1.8, bicarb 33. Lasix was reduced to 40mg  daily. His PCP added 20mg  in the evening due to leg edema.  Timothy Durham reports persistent dyspnea since his hospitalization in October. Over the last 2 days it has it has been constant, including mild dyspnea at rest. He sleeps on one pillow and endorses orthopnea but denies PND. His weight has been stable and he denies LE edema. Timothy Durham thinks his SOB may be exacerbated by loud noises or anxiety. He denies CP  or palpitations. His appetite has been poor and he denies dietary indiscretion. He has been adhering to a low (~2g/day) sodium diet. His weight has been stable at 169lb. Timothy Durham presented to [facility] this evening where his initial BP was 143/48 and HR 96. He was thought to be in acute on chronic HF due to a BNP of 17K. He received Lasix 80mg  IV in the ED and desaturated to 89-91% on room air while walking. They decided to transfer him to [facility] for further evaluation and treatment.  He received two additional doses of Lasix 60mg  IV before being changed to 80mg  PO BID.  Net fluids: -1.2L.  I had a discussion with [name] regarding daily weight monitoring and when to call.  She is concerned about her husband SCr.  We will keep the lasix at 40mg  BID and she will call if his weight starts to increase.  BMET ordered for Friday when he gets his INR checked.  He is maintaining sinus rhythm.   He had improvement in symptoms.  INR was supratherapeutic(4.70) so coumadin was held.  He was seen by EP who recommended and scheduled a BiV PPM placement for November 23.  He will stop plavix on 11/19 and coumadin on 11/20.   He will need close follow up after PPM placement as his lasix will likely need to to be adjusted.   I have asked him to monitor his weight daily.  He was given parameters when to call the office.  The patient was seen by Dr.  Brackbill who felt he was stable for DC home.    Consults: EP  Significant Diagnostic Studies:    EXAM: CHEST 2 VIEW  COMPARISON: 01/12/2014  FINDINGS: Postsurgical changes are again seen. The cardiac shadow is stable. Small bilateral pleural effusions are noted as well as fluid trapped within the minor fissure on the right. Mild vascular congestion with interstitial edema is noted. The overall appearance of the congestion is improved when compare with the prior exam however.  IMPRESSION: CHF with small bilateral pleural effusions and fluid trapped  within the minor fissure on the right.  Treatments: See above.  Discharge Exam: Blood pressure 116/48, pulse 88, temperature 97.8 F (36.6 C), temperature source Oral, resp. rate 18, height 5\' 11"  (1.803 m), weight 159 lb 3.2 oz (72.213 kg), SpO2 93 %.  Disposition: 01-Home or Self Care      Discharge Instructions    Diet - low sodium heart healthy    Complete by:  As directed      Discharge instructions    Complete by:  As directed   Monitor your weight every morning.  If you gain 3 pounds in 24 hours, or 5 pounds in a week, call the office for instructions. [phone]     Increase activity slowly    Complete by:  As directed             Medication List    TAKE these medications        acetaminophen 500 MG tablet  Commonly known as:  TYLENOL  Take 1,000 mg by mouth every 6 (six) hours as needed for mild pain or headache.     alendronate 70 MG tablet  Commonly known as:  FOSAMAX  Take 70 mg by mouth once a week. sunday     allopurinol 100 MG tablet  Commonly known as:  ZYLOPRIM  Take 200 mg by mouth daily.     atorvastatin 40 MG tablet  Commonly known as:  LIPITOR  Take 40 mg by mouth every evening.     CALCIUM 600 + D PO  Take 2 tablets by mouth every morning.     clopidogrel 75 MG tablet  Commonly known as:  PLAVIX  Take 75 mg by mouth daily.     DULoxetine 30 MG capsule  Commonly known as:  CYMBALTA  Take 30 mg by mouth daily.     Fish Oil 1200 MG Caps  Take 1 capsule by mouth every morning.     furosemide 80 MG tablet  Commonly known as:  LASIX  Take 0.5 tablets (40 mg total) by mouth 2 (two) times daily.     metoprolol tartrate 25 MG tablet  Commonly known as:  LOPRESSOR  Take 1 tablet (25 mg total) by mouth 2 (two) times daily.     omeprazole 40 MG capsule  Commonly known as:  PRILOSEC  Take 40 mg by mouth every morning.     warfarin 1 MG tablet  Commonly known as:  COUMADIN  Take 1 mg by mouth once a week. On Sunday     warfarin 4 MG  tablet  Commonly known as:  COUMADIN  Take 4 mg by mouth See admin instructions. Monday-Saturday       Follow-up Information    Follow up with Pacemaker Implant On 02/17/2014.     Greater than 30 minutes was spent completing the patient's discharge.   SignedWilburt Finlay: [place] Cantero, PAC 02/11/2014, 10:47 AM

## 2014-02-11 NOTE — Telephone Encounter (Signed)
He does not need to put the monitor back on.  Call Timothy Durham

## 2014-02-11 NOTE — Telephone Encounter (Signed)
Pt have been admitted to the hospital on Sunday. He is no longer wearing the monitor ,it was suppose to have been for 30 days. He is going to have a pacemaker put in on Monday. Her question is should she just go ahead and send the monitor back?

## 2014-02-11 NOTE — Telephone Encounter (Signed)
Pt was discharged from hospital today. Pt was wearing event monitor when admitted to hospital and it was taken off during hospitalization. Now scheduled for pacemaker insertion on February 17, 2014.  He was not told at discharge if he should put monitor back on. Wife is asking if he should resume wearing monitor.  Will forward to Dr. Antoine Poche for recommendations

## 2014-02-11 NOTE — Progress Notes (Signed)
Subjective: No SOB, orthopnea, PND  Objective: Vital signs in last 24 hours: Temp:  [97.5 F (36.4 C)-98.3 F (36.8 C)] 97.8 F (36.6 C) (11/17 0542) Pulse Rate:  [67-103] 103 (11/17 0542) Resp:  [16-18] 18 (11/17 0542) BP: (124-167)/(39-61) 124/49 mmHg (11/17 0542) SpO2:  [89 %-95 %] 93 % (11/17 0542) Weight:  [159 lb 3.2 oz (72.213 kg)] 159 lb 3.2 oz (72.213 kg) (11/17 0542) Last BM Date: 02/11/14  Intake/Output from previous day: 11/16 0701 - 11/17 0700 In: 200 [P.O.:200] Out: 1075 [Urine:1075] Intake/Output this shift:    Medications Current Facility-Administered Medications  Medication Dose Route Frequency Provider Last Rate Last Dose  . 0.9 %  sodium chloride infusion  250 mL Intravenous PRN Chilton Si, MD      . acetaminophen (TYLENOL) tablet 650 mg  650 mg Oral Q4H PRN Chilton Si, MD      . allopurinol (ZYLOPRIM) tablet 200 mg  200 mg Oral Daily Chilton Si, MD   200 mg at 02/10/14 0941  . atorvastatin (LIPITOR) tablet 40 mg  40 mg Oral QPM Chilton Si, MD   40 mg at 02/10/14 1726  . calcium-vitamin D (OSCAL WITH D) 500-200 MG-UNIT per tablet 2 tablet  2 tablet Oral Q breakfast Chilton Si, MD   2 tablet at 02/11/14 [phone]  . clopidogrel (PLAVIX) tablet 75 mg  75 mg Oral Daily Chilton Si, MD   75 mg at 02/10/14 0941  . DULoxetine (CYMBALTA) DR capsule 30 mg  30 mg Oral q1800 Quintella Reichert, MD   30 mg at 02/10/14 1726  . feeding supplement (ENSURE COMPLETE) (ENSURE COMPLETE) liquid 237 mL  237 mL Oral Daily PRN Jenifer A Williams, RD      . furosemide (LASIX) tablet 80 mg  80 mg Oral BID Lyn Records III, MD   80 mg at 02/11/14 0601  . metoprolol tartrate (LOPRESSOR) tablet 25 mg  25 mg Oral BID Chilton Si, MD   25 mg at 02/10/14 2203  . ondansetron (ZOFRAN) injection 4 mg  4 mg Intravenous Q6H PRN Chilton Si, MD      . pantoprazole (PROTONIX) EC tablet 40 mg  40 mg Oral Daily Chilton Si, MD   40 mg at 02/10/14 0000    . sodium chloride 0.9 % injection 3 mL  3 mL Intravenous Q12H Chilton Si, MD   3 mL at 02/10/14 2018  . sodium chloride 0.9 % injection 3 mL  3 mL Intravenous PRN Chilton Si, MD        PE: General appearance: alert, cooperative and no distress HEENT: PERRLA, EOMI, No JVD Lungs: clear to auscultation bilaterally Heart: irregularly irregular rhythm and 1-6 sys MM RSB Abdomen: +BS, Nontender Extremities: No LEE Pulses: 2+ and symmetric Skin: Warm and dry Neurologic: Grossly normal  Lab Results:   Recent Labs  02/09/14 1730  WBC 9.1  HGB 14.2  HCT 40.9  PLT 222   BMET  Recent Labs  02/09/14 1730 02/10/14 0233 02/11/14 0415  NA 134* 135* 137  K 5.0 3.9 3.8  CL 92* 93* 94*  CO2 25 27 30   GLUCOSE 128* 98 88  BUN 36* 33* 35*  CREATININE 1.30 1.37* 1.40*  CALCIUM 9.6 8.7 9.1   PT/INR  Recent Labs  02/09/14 1730 02/10/14 0233 02/11/14 0415  LABPROT 44.2* 39.6* 31.5*  INR 4.70* 4.04* 3.02*    Assessment/Plan  62M with chronic systolic and diastolic heart failure, LVEF 30-35%, s/p bioprosthetic AVR, mod-severe mitral  regurgitation, paroxysmal AF, CKD III, HTN, and prior CVA here with an exacerbation of his chronic heart failure   Principal Problem:   Acute on chronic combined systolic and diastolic congestive heart failure, NYHA class 3 Net fluids:  -0.9L/-1.2L.  He is getting lasix 80mg  BID PO.  SCr and BUN stable.  EP consulted for CRT.  He looks stable and ready for discharge.  Recommend TCM follow up.  BMET at office visit.  Awaiting final recommendations from Dr. Ladona Ridgelaylor.  The patient says he would like to go home and think about it.     Active Problems:   TIA 2007, Dec 2014 (Plavix added)   Hypertension  BP stable   CKD (chronic kidney disease), stage III  SCr stable   Moderate to Severe Mitral Regurgitation   PAD- LLE PTA, RCEA, attempted LICA stent   Tissue AVR 2006 (in [place])   NICM (nonischemic cardiomyopathy)  EF 30-35% by echo 01/12/14.   G3DD. Mod to sev MR.     PAF- recurrent AF with RVR Oct 2015- NSR now   Hyperlipidemia   LBBB (left bundle branch block)   History of CVA- Rt brain 2000   Chronic anticoagulation    LOS: 2 days    [name], [name] PA-C 02/11/2014 8:10 AM  Patient seen. He feels well today and is anxious to go home. His INR was supra-therapeutic on admission. He is also on Plavix.  If CRT implantation is planned in the near future we will need to coordinate with Dr. Ladona Ridgelaylor regarding his anticoagulation.Today lungs are clear. No dyspnea or chest pain.Molli Knock. Okay for discharge later today.

## 2014-02-11 NOTE — Telephone Encounter (Signed)
Pt's wife notified. She will mail monitor in

## 2014-02-13 ENCOUNTER — Telehealth: Payer: Self-pay | Admitting: Cardiology

## 2014-02-13 DIAGNOSIS — N183 Chronic kidney disease, stage 3 unspecified: Secondary | ICD-10-CM

## 2014-02-13 NOTE — Telephone Encounter (Signed)
Please call,pt is having problem breathing, This just started a few minutes ago,he just got out of the hospital on this Tuesday.

## 2014-02-13 NOTE — Telephone Encounter (Signed)
Returned call to patient's wife she stated husband has been having sob today.Stated he feels like he did before he was admitted to hospital.Stated he has not urinated much today.Patient scheduled to have bmet and inr tomorrow 02/14/14.Spoke to Dr.Hochrein he advised take lasix 80 mg now,will get results of bmet 02/14/14 before he increases lasix.Bmet and inr ordered stat at [facility] lab 02/14/14.Advised to call office 02/14/14 by 5:00 pm to get bmet results.

## 2014-02-14 ENCOUNTER — Telehealth: Payer: Self-pay | Admitting: Cardiology

## 2014-02-14 LAB — BASIC METABOLIC PANEL
BUN: 36 mg/dL — ABNORMAL HIGH (ref 6–23)
CALCIUM: 9.1 mg/dL (ref 8.4–10.5)
CO2: 32 mEq/L (ref 19–32)
Chloride: 95 mEq/L — ABNORMAL LOW (ref 96–112)
Creat: 1.34 mg/dL (ref 0.50–1.35)
GLUCOSE: 109 mg/dL — AB (ref 70–99)
Potassium: 4.2 mEq/L (ref 3.5–5.3)
SODIUM: 137 meq/L (ref 135–145)

## 2014-02-14 LAB — PROTIME-INR
INR: 2.74 — AB (ref <1.50)
Prothrombin Time: 29 seconds — ABNORMAL HIGH (ref 11.6–15.2)

## 2014-02-14 NOTE — Telephone Encounter (Signed)
RN REVIEWED LABS WITH DR [place]    RN INFORMED PATIENT'S WIFE. SHE STATES PATIENT TAKES 40 MG TWICE A DAY. PER DR [place] TAKE 480 MG IN MORNING AND 40 MG IN EVENING.  SHE VERBALIZED UNDERSTANDING.

## 2014-02-14 NOTE — Telephone Encounter (Signed)
Shanda Bumps has STAT labs

## 2014-02-14 NOTE — Telephone Encounter (Signed)
Take a look at this pt.s labs the wife wants to know what strenght of lasix to give her husband, He has a procedure on monday

## 2014-02-14 NOTE — Telephone Encounter (Signed)
RN PULLED LABS ON COMPUTER Will review with  Dr [place] (D.O.D)- Dr Antoine Poche is not in the office .

## 2014-02-14 NOTE — Telephone Encounter (Signed)
Calling to get the reuslts of lab work .Marland Kitchen Please Call    Thanks

## 2014-02-16 DIAGNOSIS — I5022 Chronic systolic (congestive) heart failure: Secondary | ICD-10-CM | POA: Diagnosis not present

## 2014-02-16 DIAGNOSIS — I739 Peripheral vascular disease, unspecified: Secondary | ICD-10-CM | POA: Diagnosis not present

## 2014-02-16 DIAGNOSIS — N183 Chronic kidney disease, stage 3 (moderate): Secondary | ICD-10-CM | POA: Diagnosis not present

## 2014-02-16 DIAGNOSIS — I480 Paroxysmal atrial fibrillation: Secondary | ICD-10-CM | POA: Diagnosis not present

## 2014-02-16 DIAGNOSIS — I429 Cardiomyopathy, unspecified: Secondary | ICD-10-CM | POA: Diagnosis not present

## 2014-02-16 DIAGNOSIS — Z880 Allergy status to penicillin: Secondary | ICD-10-CM | POA: Diagnosis not present

## 2014-02-16 DIAGNOSIS — E785 Hyperlipidemia, unspecified: Secondary | ICD-10-CM | POA: Diagnosis not present

## 2014-02-16 DIAGNOSIS — I129 Hypertensive chronic kidney disease with stage 1 through stage 4 chronic kidney disease, or unspecified chronic kidney disease: Secondary | ICD-10-CM | POA: Diagnosis not present

## 2014-02-16 DIAGNOSIS — Z87891 Personal history of nicotine dependence: Secondary | ICD-10-CM | POA: Diagnosis not present

## 2014-02-16 DIAGNOSIS — Z7901 Long term (current) use of anticoagulants: Secondary | ICD-10-CM | POA: Diagnosis not present

## 2014-02-16 DIAGNOSIS — Z952 Presence of prosthetic heart valve: Secondary | ICD-10-CM | POA: Diagnosis not present

## 2014-02-16 DIAGNOSIS — I447 Left bundle-branch block, unspecified: Secondary | ICD-10-CM | POA: Diagnosis not present

## 2014-02-16 DIAGNOSIS — Z882 Allergy status to sulfonamides status: Secondary | ICD-10-CM | POA: Diagnosis not present

## 2014-02-16 DIAGNOSIS — K219 Gastro-esophageal reflux disease without esophagitis: Secondary | ICD-10-CM | POA: Diagnosis not present

## 2014-02-16 DIAGNOSIS — Z8673 Personal history of transient ischemic attack (TIA), and cerebral infarction without residual deficits: Secondary | ICD-10-CM | POA: Diagnosis not present

## 2014-02-16 DIAGNOSIS — I340 Nonrheumatic mitral (valve) insufficiency: Secondary | ICD-10-CM | POA: Diagnosis not present

## 2014-02-16 MED ORDER — SODIUM CHLORIDE 0.9 % IV SOLN
INTRAVENOUS | Status: DC
  Administered 2014-02-17: 09:00:00 via INTRAVENOUS

## 2014-02-16 MED ORDER — VANCOMYCIN HCL IN DEXTROSE 1-5 GM/200ML-% IV SOLN
1000.0000 mg | INTRAVENOUS | Status: DC
  Filled 2014-02-16: qty 200

## 2014-02-16 MED ORDER — SODIUM CHLORIDE 0.9 % IR SOLN
80.0000 mg | Status: DC
  Filled 2014-02-16: qty 2

## 2014-02-16 MED ORDER — CHLORHEXIDINE GLUCONATE 4 % EX LIQD
60.0000 mL | Freq: Once | CUTANEOUS | Status: DC
  Filled 2014-02-16: qty 60

## 2014-02-16 NOTE — Telephone Encounter (Signed)
Will send back to RN.  Typo in instructions.

## 2014-02-17 ENCOUNTER — Ambulatory Visit (HOSPITAL_COMMUNITY)
Admission: RE | Admit: 2014-02-17 | Discharge: 2014-02-18 | Disposition: A | Discharge status: Home / Self Care | Payer: Medicare HMO | Source: Ambulatory Visit | Attending: Internal Medicine | Admitting: Internal Medicine

## 2014-02-17 ENCOUNTER — Encounter: Payer: Self-pay | Admitting: Cardiology

## 2014-02-17 ENCOUNTER — Encounter (HOSPITAL_COMMUNITY): Payer: Self-pay | Admitting: *Deleted

## 2014-02-17 ENCOUNTER — Encounter (HOSPITAL_COMMUNITY)
Admission: RE | Disposition: A | Discharge status: Home / Self Care | Payer: Self-pay | Source: Ambulatory Visit | Attending: Internal Medicine

## 2014-02-17 DIAGNOSIS — I429 Cardiomyopathy, unspecified: Secondary | ICD-10-CM | POA: Insufficient documentation

## 2014-02-17 DIAGNOSIS — K219 Gastro-esophageal reflux disease without esophagitis: Secondary | ICD-10-CM | POA: Insufficient documentation

## 2014-02-17 DIAGNOSIS — Z87891 Personal history of nicotine dependence: Secondary | ICD-10-CM | POA: Insufficient documentation

## 2014-02-17 DIAGNOSIS — I447 Left bundle-branch block, unspecified: Secondary | ICD-10-CM | POA: Insufficient documentation

## 2014-02-17 DIAGNOSIS — I340 Nonrheumatic mitral (valve) insufficiency: Secondary | ICD-10-CM | POA: Insufficient documentation

## 2014-02-17 DIAGNOSIS — Z950 Presence of cardiac pacemaker: Secondary | ICD-10-CM | POA: Diagnosis present

## 2014-02-17 DIAGNOSIS — E785 Hyperlipidemia, unspecified: Secondary | ICD-10-CM | POA: Insufficient documentation

## 2014-02-17 DIAGNOSIS — I5022 Chronic systolic (congestive) heart failure: Secondary | ICD-10-CM

## 2014-02-17 DIAGNOSIS — Z8673 Personal history of transient ischemic attack (TIA), and cerebral infarction without residual deficits: Secondary | ICD-10-CM | POA: Insufficient documentation

## 2014-02-17 DIAGNOSIS — N183 Chronic kidney disease, stage 3 (moderate): Secondary | ICD-10-CM | POA: Insufficient documentation

## 2014-02-17 DIAGNOSIS — I129 Hypertensive chronic kidney disease with stage 1 through stage 4 chronic kidney disease, or unspecified chronic kidney disease: Secondary | ICD-10-CM | POA: Insufficient documentation

## 2014-02-17 DIAGNOSIS — Z880 Allergy status to penicillin: Secondary | ICD-10-CM | POA: Insufficient documentation

## 2014-02-17 DIAGNOSIS — Z7901 Long term (current) use of anticoagulants: Secondary | ICD-10-CM | POA: Insufficient documentation

## 2014-02-17 DIAGNOSIS — Z952 Presence of prosthetic heart valve: Secondary | ICD-10-CM | POA: Insufficient documentation

## 2014-02-17 DIAGNOSIS — Z882 Allergy status to sulfonamides status: Secondary | ICD-10-CM | POA: Insufficient documentation

## 2014-02-17 DIAGNOSIS — I480 Paroxysmal atrial fibrillation: Secondary | ICD-10-CM | POA: Insufficient documentation

## 2014-02-17 DIAGNOSIS — I739 Peripheral vascular disease, unspecified: Secondary | ICD-10-CM | POA: Insufficient documentation

## 2014-02-17 HISTORY — PX: BI-VENTRICULAR PACEMAKER INSERTION: SHX5462

## 2014-02-17 HISTORY — DX: Anxiety disorder, unspecified: F41.9

## 2014-02-17 LAB — PROTIME-INR
INR: 2.34 — ABNORMAL HIGH (ref 0.00–1.49)
PROTHROMBIN TIME: 25.9 s — AB (ref 11.6–15.2)

## 2014-02-17 LAB — SURGICAL PCR SCREEN
MRSA, PCR: NEGATIVE
Staphylococcus aureus: NEGATIVE

## 2014-02-17 SURGERY — BI-VENTRICULAR PACEMAKER INSERTION (CRT-P)
Anesthesia: LOCAL

## 2014-02-17 MED ORDER — ACETAMINOPHEN 500 MG PO TABS
1000.0000 mg | ORAL_TABLET | Freq: Four times a day (QID) | ORAL | Status: DC | PRN
  Administered 2014-02-17: 1000 mg via ORAL
  Filled 2014-02-17: qty 2

## 2014-02-17 MED ORDER — ACETAMINOPHEN 325 MG PO TABS: 325.0000 mg | ORAL_TABLET | ORAL | Status: DC | PRN

## 2014-02-17 MED ORDER — MIDAZOLAM HCL 5 MG/5ML IJ SOLN
INTRAMUSCULAR | Status: AC
  Filled 2014-02-17: qty 5

## 2014-02-17 MED ORDER — WARFARIN SODIUM 1 MG PO TABS: 1.0000 mg | ORAL_TABLET | ORAL | Status: DC

## 2014-02-17 MED ORDER — METOPROLOL TARTRATE 25 MG PO TABS
25.0000 mg | ORAL_TABLET | Freq: Two times a day (BID) | ORAL | Status: DC
  Administered 2014-02-17 – 2014-02-18 (×2): 25 mg via ORAL
  Filled 2014-02-17 (×3): qty 1

## 2014-02-17 MED ORDER — MUPIROCIN 2 % EX OINT
1.0000 "application " | TOPICAL_OINTMENT | Freq: Once | CUTANEOUS | Status: AC
  Administered 2014-02-17: 1 via TOPICAL

## 2014-02-17 MED ORDER — FUROSEMIDE 40 MG PO TABS
40.0000 mg | ORAL_TABLET | Freq: Two times a day (BID) | ORAL | Status: DC
  Administered 2014-02-17 – 2014-02-18 (×2): 40 mg via ORAL
  Filled 2014-02-17 (×4): qty 1

## 2014-02-17 MED ORDER — HEPARIN (PORCINE) IN NACL 2-0.9 UNIT/ML-% IJ SOLN
INTRAMUSCULAR | Status: AC
  Filled 2014-02-17: qty 500

## 2014-02-17 MED ORDER — WARFARIN SODIUM 4 MG PO TABS
4.0000 mg | ORAL_TABLET | ORAL | Status: DC
  Administered 2014-02-17: 4 mg via ORAL
  Filled 2014-02-17 (×2): qty 1

## 2014-02-17 MED ORDER — PANTOPRAZOLE SODIUM 40 MG PO TBEC
40.0000 mg | DELAYED_RELEASE_TABLET | Freq: Every day | ORAL | Status: DC
  Administered 2014-02-17 – 2014-02-18 (×2): 40 mg via ORAL
  Filled 2014-02-17: qty 1

## 2014-02-17 MED ORDER — MUPIROCIN 2 % EX OINT
TOPICAL_OINTMENT | CUTANEOUS | Status: AC
  Administered 2014-02-17: 1 via TOPICAL
  Filled 2014-02-17: qty 22

## 2014-02-17 MED ORDER — ALENDRONATE SODIUM 70 MG PO TABS: 70.0000 mg | ORAL_TABLET | ORAL | Status: DC

## 2014-02-17 MED ORDER — VANCOMYCIN HCL IN DEXTROSE 1-5 GM/200ML-% IV SOLN
1000.0000 mg | Freq: Two times a day (BID) | INTRAVENOUS | Status: AC
  Administered 2014-02-17: 1000 mg via INTRAVENOUS
  Filled 2014-02-17: qty 200

## 2014-02-17 MED ORDER — ATORVASTATIN CALCIUM 40 MG PO TABS
40.0000 mg | ORAL_TABLET | Freq: Every evening | ORAL | Status: DC
  Administered 2014-02-17: 40 mg via ORAL
  Filled 2014-02-17 (×2): qty 1

## 2014-02-17 MED ORDER — ONDANSETRON HCL 4 MG/2ML IJ SOLN
4.0000 mg | Freq: Four times a day (QID) | INTRAMUSCULAR | Status: DC | PRN
  Administered 2014-02-17: 4 mg via INTRAVENOUS
  Filled 2014-02-17: qty 2

## 2014-02-17 MED ORDER — FENTANYL CITRATE 0.05 MG/ML IJ SOLN
INTRAMUSCULAR | Status: AC
  Filled 2014-02-17: qty 2

## 2014-02-17 MED ORDER — DULOXETINE HCL 30 MG PO CPEP
30.0000 mg | ORAL_CAPSULE | Freq: Every day | ORAL | Status: DC
  Administered 2014-02-17: 30 mg via ORAL
  Filled 2014-02-17 (×2): qty 1

## 2014-02-17 MED ORDER — ALLOPURINOL 100 MG PO TABS
200.0000 mg | ORAL_TABLET | Freq: Every day | ORAL | Status: DC
  Administered 2014-02-17 – 2014-02-18 (×2): 200 mg via ORAL
  Filled 2014-02-17 (×2): qty 2

## 2014-02-17 MED ORDER — WARFARIN - PHYSICIAN DOSING INPATIENT: Freq: Every day | Status: DC

## 2014-02-17 MED ORDER — LIDOCAINE HCL (PF) 1 % IJ SOLN
INTRAMUSCULAR | Status: AC
  Filled 2014-02-17: qty 60

## 2014-02-17 NOTE — Plan of Care (Signed)
Problem: Consults Goal: Permanent Pacemaker Patient Education (See Patient Education module for education specifics.)  Outcome: Progressing Goal: Skin Care Protocol Initiated - if Braden Score 18 or less If consults are not indicated, leave blank or document N/A  Outcome: Not Applicable Date Met:  29/57/47 Goal: Tobacco Cessation referral if indicated Outcome: Not Applicable Date Met:  34/03/70 Goal: Diabetes Guidelines if Diabetic/Glucose > 140 If diabetic or lab glucose is > 140 mg/dl - Initiate Diabetes/Hyperglycemia Guidelines & Document Interventions  Outcome: Not Applicable Date Met:  96/43/83

## 2014-02-17 NOTE — Interval H&P Note (Signed)
History and Physical Interval Note:  02/17/2014 9:23 AM  Timothy Durham  has presented today for surgery, with the diagnosis of bradicardia  The various methods of treatment have been discussed with the patient and family. After consideration of risks, benefits and other options for treatment, the patient has consented to  Procedure(s): BI-VENTRICULAR PACEMAKER INSERTION (CRT-P) (N/A) as a surgical intervention .  The patient's history has been reviewed, patient examined, no change in status, stable for surgery.  I have reviewed the patient's chart and labs.  Questions were answered to the patient's satisfaction.     [name].D.

## 2014-02-17 NOTE — CV Procedure (Signed)
SURGEON: Lewayne Bunting, MD   PREPROCEDURE DIAGNOSIS: Symptomatic LBBB AV block, non-ischemic CM (EF 30%), NYHA Class IIl CHF   POSTPROCEDURE DIAGNOSIS: Symptomatic LBBB AV block, ischemic CM (EF 30%), NYHA Class IIl CHF   PROCEDURES:  1. Left upper extremity venography.  2. Biventricular Pacemaker implantation.   INTRODUCTION: Timothy Durham is a 80 y.o. male with a history of symptomatic LBBB AV block with EF 30% who presents today for pacemaker implantation.  He is expected to V paced > 40% and has an EF of 30%. The patient therefore presents today for biventricular pacemaker implantation.   DESCRIPTION OF PROCEDURE: Informed written consent was obtained, and the patient was brought to the electrophysiology lab in a fasting state. The patient required no sedation for the procedure today. The patients left chest was prepped and draped in the usual sterile fashion by the EP lab staff. The skin overlying the left deltopectoral region was infiltrated with lidocaine for local analgesia. A 5-cm incision was made over the left deltopectoral region. A left subcutaneous pacemaker pocket was fashioned using a combination of sharp and blunt dissection. Electrocautery was required to assure hemostasis.    RA/RV Lead Placement:  The left axillary vein was cannulated. Through the left axillary vein, a Medtronic model [id number] (serial number [id number]) right atrial lead and a Medtronic model [id number] (serial number [id number]) right ventricular lead were advanced with fluoroscopic visualization into the right atrial appendage and right ventricular apical septal positions respectively. Initial atrial lead P- waves measured 1.2 mV with impedance of 552 ohms and a threshold of 1.1 V at 0.5 msec. Right ventricular lead R-waves measured 19 mV with an impedance of 911 ohms and a threshold of 0.8 V at 0.5 msec. Both leads were secured to the pectoralis fascia using #2-0 silk over the suture sleeves.   LV Lead Placement:    A Medtronic guide was advanced through the left axillary vein into the low lateral right atrium. A 6 french hexapolar EP catheter was introduced through the  Medtronic guide and used to cannulate the coronary sinus. A coronary sinus nonselective venogram was performed by hand injection of nonionic contrast. This demonstrated a Posterior vein with a lateral and posterior branch.  A 0.014 angioplasty guide wire was introduced through the Medtronic Guide and advanced into the lateral branch of the posterior vein. A Medtronic [id number](serial number [id number]) lead was advanced through the posterior vein and into the distal lateral branch. This was approximately two-thirds from the base to the apex in a very lateral  position. In this location with 1-2 bipolar configuration, the left ventricular lead R-waves measured 17 mV with impedance of 1353 ohms and a threshold of 0.8 volt at 0.5 Milliseconds with no diaphragmatic stimulation observed when pacing at 10 volts output. The Medtronic guide was therefore removed.  All three leads were secured to the pectoralis fascia using #2 silk suture over the suture sleeves. The pocket then irrigated with copious gentamicin solution.   Device Placement:  The leads were then connected to the Medtronic  (serial number [id number]) pacemaker. The pacemaker was then placed into the pocket. The pocket was then closed in 2 layers with 2.0 Vicryl suture for the subcutaneous and subcuticular layers. Steri- strips and a sterile dressing were then applied.  There were no early apparent complications.   CONCLUSIONS:  1. Successful implantation of a Medtronic BiV pacemaker for symptomatic LBBB AV block  2. No early apparent complications.   Lewayne Bunting, MD  11:50 AM  02/17/2014    

## 2014-02-17 NOTE — Discharge Summary (Signed)
ELECTROPHYSIOLOGY PROCEDURE DISCHARGE SUMMARY    Patient ID: Timothy Durham,  MRN: [id number], DOB/AGE: [birth year] 80 y.o.  Admit date: 02/17/2014 Discharge date: 02/18/2014  Primary Care Physician: Dennis Bast, MD Primary Cardiologist: Hochrein Electrophysiologist: Ladona Ridgel  Primary Discharge Diagnosis:  Non ischemic cardiomyopathy and chronic systolic heart failure with LBBB status post CRTP implantation this admission  Secondary Discharge Diagnosis:  1.  Non obstructive CAD 2.  Hypertension 3.  Prior CVA 4.  CKD 5.  Hyperlipidemia 6.  Paroxysmal atrial fibrillation  7.  Valvular heart disease s/p bioprosthetic AVR   Allergies  Allergen Reactions  . Ambien [Zolpidem] Other (See Comments)    Hallucinations  . Ativan [Lorazepam] Other (See Comments)    hallucinations  . Penicillins     Unknown childhood reaction   . Sulfa Antibiotics     unknown     Procedures This Admission:  1.  Implantation of MDT CRTP on 02-17-14 by Dr Ladona Ridgel.  The patient received a MDT CRTP with model number 5076 right atrial and right ventricular leads with model number 4194 left ventricular lead.  There were no early apparent complications. 2.  CXR on 02-18-2014 demonstrated no pneumothorax status post device implant.   Brief HPI: Timothy Durham is a 80 y.o. male with a past medical history as outlined above.  He has a chronic non ischemic cardiomyopathy with heart failure hospitalizations.  Risks, benefits, and alternatives to CRTP implantation were reviewed with the patient who wished to proceed.   Hospital Course:  The patient was admitted and underwent implantation of a MDT CRTP with details as outlined above.   He was monitored on telemetry overnight which demonstrated sinus rhythm with ventricular pacing, occasional PVC's.  Left chest was without hematoma or ecchymosis.  The device was interrogated and found to be functioning normally.  CXR was obtained and demonstrated no pneumothorax status  post device implantation.  Wound care, arm mobility, and restrictions were reviewed with the patient.  Dr Ladona Ridgel examined the patient and considered them stable for discharge to home.    Discharge Vitals: Blood pressure 137/49, pulse 69, temperature 97.8 F (36.6 C), temperature source Oral, resp. rate 18, height 5\' 11"  (1.803 m), weight 164 lb (74.39 kg), SpO2 94 %.    Labs:   Lab Results  Component Value Date   WBC 9.1 02/09/2014   HGB 14.2 02/09/2014   HCT 40.9 02/09/2014   MCV 97.1 02/09/2014   PLT 222 02/09/2014     Recent Labs Lab 02/14/14 1015  NA 137  K 4.2  CL 95*  CO2 32  BUN 36*  CREATININE 1.34  CALCIUM 9.1  GLUCOSE 109*    Discharge Medications:    Medication List    ASK your doctor about these medications        acetaminophen 500 MG tablet  Commonly known as:  TYLENOL  Take 1,000 mg by mouth every 6 (six) hours as needed for mild pain or headache.     alendronate 70 MG tablet  Commonly known as:  FOSAMAX  Take 70 mg by mouth once a week. sunday     allopurinol 100 MG tablet  Commonly known as:  ZYLOPRIM  Take 200 mg by mouth daily.     atorvastatin 40 MG tablet  Commonly known as:  LIPITOR  Take 40 mg by mouth every evening.     CALCIUM 600 + D PO  Take 2 tablets by mouth every morning.     clopidogrel 75 MG tablet  Commonly known as:  PLAVIX  Take 75 mg by mouth daily.     DULoxetine 30 MG capsule  Commonly known as:  CYMBALTA  Take 30 mg by mouth daily.     Fish Oil 1200 MG Caps  Take 1 capsule by mouth every morning.     furosemide 80 MG tablet  Commonly known as:  LASIX  Take 0.5 tablets (40 mg total) by mouth 2 (two) times daily.     metoprolol tartrate 25 MG tablet  Commonly known as:  LOPRESSOR  Take 1 tablet (25 mg total) by mouth 2 (two) times daily.     omeprazole 40 MG capsule  Commonly known as:  PRILOSEC  Take 40 mg by mouth every morning.     warfarin 1 MG tablet  Commonly known as:  COUMADIN  Take 1 mg by  mouth once a week. On Sunday     warfarin 4 MG tablet  Commonly known as:  COUMADIN  Take 4 mg by mouth See admin instructions. Monday-Saturday        Disposition:     Duration of Discharge Encounter: Greater than 30 minutes including physician time.  Signed,  Leonia ReevesGregg Jayant Kriz,M.D.

## 2014-02-17 NOTE — H&P (View-Only) (Signed)
       Reason for Consult:   ? Biv pacer candidate  Requesting Physician: Dr Smith  HPI: This is a 80 y.o. male with a past medical history significant for prior CVA in 2000 felt to be secondary to AF. He has been on chronic anticoagulation. He has had subsequent TIAs in 2007 and in Dec 2014, Plavix was added. He is s/p tissue AVR in 2006 in [place]. He had been followed by a cardiologist in [place]. We saw the pt in Oct when he presented with acute CHF in setting of rapid AF. He was diuresed and he converted after Diltiazem was ordered. Echo 01/12/14 showed a drop in his AF to 30-35% (previous echo in Dec 2014- 40-45%). He underwent Rt and Lt heart cath 01/14/14 and this showed moderate non obstructive CAD with a 50% CFX and mild pulmonary HTN. He was discharged 01/16/14. His diuretics were decreased as an OP secondary to worsening renal function. He came back to the ER 02/09/14 with SOB. His BNP was 17k. He has been in NSR. He has improved since admission although no significant diuresis or wgt change recorded. Dr Taylor is asked to see for BiV Pacemaker evaluation. The pt has a LBBB. He and his wife state he has been doing well October this year. Prior to that he had been driving and doing light yard work.   PMHx:  Past Medical History  Diagnosis Date  . Arthritis   . Non-obstructive CAD     a. 12/2013 NSTEMI/Cath: LM nl, LAD 20, LCX 40-50, RCA 20.  . Hypertension   . Stroke     a. July 2000;  b. January 2007;  c. TIA in 2014.  . CKD (chronic kidney disease), stage III   . GERD (gastroesophageal reflux disease)   . Foot pain, bilateral   . H/O hematuria   . Hyperlipidemia   . Vitamin D deficiency   . Chronic combined systolic and diastolic CHF (congestive heart failure)     a. 12/2013 Echo: EF 30-35%, mod conc LVH, Gr 3 DD, mild AI, mod-sev MR, mod dil LA, mild TR.  . PAF (paroxysmal atrial fibrillation)     a. CHA2DS2VASc = 7--> Chronic Coumadin.  . NICM (nonischemic  cardiomyopathy)     a. 12/2013 Echo: EF 30-35%.  . Aortic valve prosthesis present     a. 2006: S/P bioprosthetic AVR.  . PAD (peripheral artery disease)     a. s/p LLE stenting.  . Carotid arterial disease     a. s/p R CEA  . History of tobacco abuse   . Moderate to Severe Mitral Regurgitation     a. 12/2013 Echo: Mod-Sev MR.    Past Surgical History  Procedure Laterality Date  . Aortic valve replacement (avr)/coronary artery bypass grafting (cabg)  12/2008  . Eye surgery    . Carotid angiogram  1996  . Coronary artery bypass graft    . Left lower extremity stent    . Carotid endarterectomy      SOCHx:  reports that he quit smoking about 15 years ago. He has never used smokeless tobacco. He reports that he drinks about 0.6 oz of alcohol per week. He reports that he does not use illicit drugs.  FAMHx: Family History  Problem Relation Age of Onset  . Stroke Mother   . Cancer Father     ALLERGIES: Allergies  Allergen Reactions  . Ambien [Zolpidem] Other (See Comments)    Hallucinations  .   Ativan [Lorazepam] Other (See Comments)    hallucinations  . Penicillins     Unknown childhood reaction   . Sulfa Antibiotics     unknown    ROS: See H&P for complete ROS  HOME MEDICATIONS: Prior to Admission medications   Medication Sig Start Date End Date Taking? Authorizing Provider  acetaminophen (TYLENOL) 500 MG tablet Take 1,000 mg by mouth every 6 (six) hours as needed for mild pain or headache.    Yes Historical Provider, MD  alendronate (FOSAMAX) 70 MG tablet Take 70 mg by mouth once a week. sunday 02/28/13  Yes Historical Provider, MD  allopurinol (ZYLOPRIM) 100 MG tablet Take 200 mg by mouth daily.   Yes Historical Provider, MD  atorvastatin (LIPITOR) 40 MG tablet Take 40 mg by mouth every evening.   Yes Historical Provider, MD  Calcium Carb-Cholecalciferol (CALCIUM 600 + D PO) Take 2 tablets by mouth every morning.   Yes Historical Provider, MD  clopidogrel (PLAVIX)  75 MG tablet Take 75 mg by mouth daily.   Yes Historical Provider, MD  DULoxetine (CYMBALTA) 30 MG capsule Take 30 mg by mouth daily.   Yes Historical Provider, MD  furosemide (LASIX) 40 MG tablet Take 20-40 mg by mouth 2 (two) times daily. Take 1 pill (40mg) in the morning and 0.5 pill (20mg) in the evening   Yes Historical Provider, MD  metoprolol tartrate (LOPRESSOR) 25 MG tablet Take 1 tablet (25 mg total) by mouth 2 (two) times daily. 01/16/14  Yes Gokul Krishnan, MD  Omega-3 Fatty Acids (FISH OIL) 1200 MG CAPS Take 1 capsule by mouth every morning.   Yes Historical Provider, MD  omeprazole (PRILOSEC) 40 MG capsule Take 40 mg by mouth every morning.   Yes Historical Provider, MD  warfarin (COUMADIN) 1 MG tablet Take 1 mg by mouth once a week. On Sunday   Yes Historical Provider, MD  warfarin (COUMADIN) 4 MG tablet Take 4 mg by mouth See admin instructions. Monday-Saturday 02/24/13  Yes Historical Provider, MD    HOSPITAL MEDICATIONS: I have reviewed the patient's current medications.  VITALS: Blood pressure 148/39, pulse 71, temperature 98.3 F (36.8 C), temperature source Oral, resp. rate 16, height 5' 11" (1.803 m), weight 165 lb 5.5 oz (75 kg), SpO2 89 %.  PHYSICAL EXAM: General appearance: alert, cooperative, appears stated age and no distress Neck: no carotid bruit and no JVD Lungs: few crackles Rt base Heart: regular rate and rhythm and 2/6 systolic murmur AOV area Abdomen: soft, non-tender; bowel sounds normal; no masses,  no organomegaly Extremities: extremities normal, atraumatic, no cyanosis or edema Pulses: 2+ and symmetric Skin: Skin color, texture, turgor normal. No rashes or lesions Neurologic: Grossly normal  LABS: Results for orders placed or performed during the hospital encounter of 02/09/14 (from the past 24 hour(s))  Basic metabolic panel     Status: Abnormal   Collection Time: 02/09/14  5:30 PM  Result Value Ref Range   Sodium 134 (L) 137 - 147 mEq/L    Potassium 5.0 3.7 - 5.3 mEq/L   Chloride 92 (L) 96 - 112 mEq/L   CO2 25 19 - 32 mEq/L   Glucose, Bld 128 (H) 70 - 99 mg/dL   BUN 36 (H) 6 - 23 mg/dL   Creatinine, Ser 1.30 0.50 - 1.35 mg/dL   Calcium 9.6 8.4 - 10.5 mg/dL   GFR calc non Af Amer 50 (L) >90 mL/min   GFR calc Af Amer 58 (L) >90 mL/min   Anion gap   17 (H) 5 - 15  CBC WITH DIFFERENTIAL     Status: Abnormal   Collection Time: 02/09/14  5:30 PM  Result Value Ref Range   WBC 9.1 4.0 - 10.5 K/uL   RBC 4.21 (L) 4.22 - 5.81 MIL/uL   Hemoglobin 14.2 13.0 - 17.0 g/dL   HCT 40.9 39.0 - 52.0 %   MCV 97.1 78.0 - 100.0 fL   MCH 33.7 26.0 - 34.0 pg   MCHC 34.7 30.0 - 36.0 g/dL   RDW 14.1 11.5 - 15.5 %   Platelets 222 150 - 400 K/uL   Neutrophils Relative % 83 (H) 43 - 77 %   Neutro Abs 7.5 1.7 - 7.7 K/uL   Lymphocytes Relative 6 (L) 12 - 46 %   Lymphs Abs 0.5 (L) 0.7 - 4.0 K/uL   Monocytes Relative 9 3 - 12 %   Monocytes Absolute 0.8 0.1 - 1.0 K/uL   Eosinophils Relative 2 0 - 5 %   Eosinophils Absolute 0.2 0.0 - 0.7 K/uL   Basophils Relative 0 0 - 1 %   Basophils Absolute 0.0 0.0 - 0.1 K/uL  Troponin I (MHP)     Status: None   Collection Time: 02/09/14  5:30 PM  Result Value Ref Range   Troponin I <0.30 <0.30 ng/mL  Pro b natriuretic peptide     Status: Abnormal   Collection Time: 02/09/14  5:30 PM  Result Value Ref Range   Pro B Natriuretic peptide (BNP) 17899.0 (H) 0 - 450 pg/mL  Protime-INR     Status: Abnormal   Collection Time: 02/09/14  5:30 PM  Result Value Ref Range   Prothrombin Time 44.2 (H) 11.6 - 15.2 seconds   INR 4.70 (H) 0.00 - 1.49  Urinalysis, Routine w reflex microscopic     Status: Abnormal   Collection Time: 02/09/14  8:30 PM  Result Value Ref Range   Color, Urine YELLOW YELLOW   APPearance CLEAR CLEAR   Specific Gravity, Urine 1.011 1.005 - 1.030   pH 5.5 5.0 - 8.0   Glucose, UA NEGATIVE NEGATIVE mg/dL   Hgb urine dipstick NEGATIVE NEGATIVE   Bilirubin Urine NEGATIVE NEGATIVE   Ketones, ur 15  (A) NEGATIVE mg/dL   Protein, ur NEGATIVE NEGATIVE mg/dL   Urobilinogen, UA 0.2 0.0 - 1.0 mg/dL   Nitrite NEGATIVE NEGATIVE   Leukocytes, UA NEGATIVE NEGATIVE  MRSA PCR Screening     Status: None   Collection Time: 02/09/14 10:40 PM  Result Value Ref Range   MRSA by PCR NEGATIVE NEGATIVE  Basic metabolic panel     Status: Abnormal   Collection Time: 02/10/14  2:33 AM  Result Value Ref Range   Sodium 135 (L) 137 - 147 mEq/L   Potassium 3.9 3.7 - 5.3 mEq/L   Chloride 93 (L) 96 - 112 mEq/L   CO2 27 19 - 32 mEq/L   Glucose, Bld 98 70 - 99 mg/dL   BUN 33 (H) 6 - 23 mg/dL   Creatinine, Ser 1.37 (H) 0.50 - 1.35 mg/dL   Calcium 8.7 8.4 - 10.5 mg/dL   GFR calc non Af Amer 47 (L) >90 mL/min   GFR calc Af Amer 54 (L) >90 mL/min   Anion gap 15 5 - 15  Protime-INR     Status: Abnormal   Collection Time: 02/10/14  2:33 AM  Result Value Ref Range   Prothrombin Time 39.6 (H) 11.6 - 15.2 seconds   INR 4.04 (H) 0.00 - 1.49      EKG: NSR LBBB  IMAGING: Dg Chest 2 View  02/09/2014   CLINICAL DATA:  Shortness of breath  EXAM: CHEST  2 VIEW  COMPARISON:  01/12/2014  FINDINGS: Postsurgical changes are again seen. The cardiac shadow is stable. Small bilateral pleural effusions are noted as well as fluid trapped within the minor fissure on the right. Mild vascular congestion with interstitial edema is noted. The overall appearance of the congestion is improved when compare with the prior exam however.  IMPRESSION: CHF with small bilateral pleural effusions and fluid trapped within the minor fissure on the right.   Electronically Signed   By: Mark  Lukens M.D.   On: 02/09/2014 17:48   Echo: 01/12/14 Study Conclusions  - Left ventricle: There is global hypokinesis with akinesis of the basal and mid inferolateral walls. The cavity size was moderately dilated. There was moderate concentric hypertrophy. Systolic function was moderately to severely reduced. The estimated ejection fraction was in  the range of 30% to 35%. Doppler parameters are consistent with a restrictive pattern, indicative of decreased left ventricular diastolic compliance and/or increased left atrial pressure (grade 3 diastolic dysfunction). Doppler parameters are consistent with elevated ventricular end-diastolic filling pressure. - Aortic valve: Bioprosthetic valve seems to be functioning normally. Normal transaortic gradients. There is mild central regurgitation and no paravalvular leak. There was mild regurgitation directed eccentrically in the LVOT. Valve area (Vmax): 0.96 cm^2. - Aorta: The aorta was normal, not dilated, and non-diseased. Aortic root dimension: 40 mm (ED). - Mitral valve: There was moderate to severe regurgitation. - Left atrium: The atrium was moderately dilated. - Right ventricle: Systolic function was moderately reduced. - Atrial septum: No defect or patent foramen ovale was identified. - Tricuspid valve: There was mild regurgitation.  Impressions:  - When compared to the prior study from 03/26/2013 the left ventricle is now moderately dilated with moderate to severe systolic dysfunction and estimated LVEF 30-35%, previously 40-45%. There is restrictive pattern of diastolic dysfunction with elevated filling pressures. Mitral regurgitation is moderate to severe, previosuly mild. Right ventricular systolic function is moderately reduced.  IMPRESSION: Principal Problem:   Acute on chronic combined systolic and diastolic congestive heart failure, NYHA class 3 Active Problems:   NICM (nonischemic cardiomyopathy)   TIA 2007, Dec 2014 (Plavix added)   CKD (chronic kidney disease), stage III   PAF- recurrent AF with RVR Oct 2015- NSR now   Hypertension   Moderate to Severe Mitral Regurgitation   PAD- LLE PTA, RCEA, attempted LICA stent   Tissue AVR 2006 (in [place])   Hyperlipidemia   LBBB (left bundle branch block)   Chronic anticoagulation    History of CVA- Rt brain 2000   RECOMMENDATION: Will review with Dr Taylor. ? Biv pacemaker candidate with recent drop in his EF with recurrent CHF and chronic LBBB. The pt is DNR (his request). He has a had prior stroke but apparently has no significant residual deficits.   Time Spent Directly with Patient: 45 minutes  KILROY,LUKE [phone] beeper 02/10/2014, 2:06 PM    EP Attending  Patient seen and examined. Agree with Mr. Kilroy's note. He is a candidate for BiV PPM as he has been admitted twice with worsening heart failure, once with uncontrolled atrial fib and now in NSR. I would suggest he be allowed to be discharged home and come back to have this done electively. Note he is on both plavix and coumadin and INR today is 3. Current anti-coagulation status would make us require holding both plavix   and Coumadin. Will arrange for outpatient device implant.  Gregg Taylor,M.D. 

## 2014-02-18 ENCOUNTER — Other Ambulatory Visit: Payer: Self-pay | Admitting: *Deleted

## 2014-02-18 ENCOUNTER — Ambulatory Visit (HOSPITAL_COMMUNITY): Payer: Medicare HMO

## 2014-02-18 DIAGNOSIS — I129 Hypertensive chronic kidney disease with stage 1 through stage 4 chronic kidney disease, or unspecified chronic kidney disease: Secondary | ICD-10-CM | POA: Diagnosis not present

## 2014-02-18 DIAGNOSIS — I5022 Chronic systolic (congestive) heart failure: Secondary | ICD-10-CM | POA: Diagnosis not present

## 2014-02-18 DIAGNOSIS — I447 Left bundle-branch block, unspecified: Secondary | ICD-10-CM | POA: Diagnosis not present

## 2014-02-18 DIAGNOSIS — I429 Cardiomyopathy, unspecified: Secondary | ICD-10-CM

## 2014-02-18 DIAGNOSIS — I4891 Unspecified atrial fibrillation: Secondary | ICD-10-CM

## 2014-02-18 DIAGNOSIS — Z950 Presence of cardiac pacemaker: Secondary | ICD-10-CM

## 2014-02-18 LAB — PROTIME-INR
INR: 2.38 — AB (ref 0.00–1.49)
Prothrombin Time: 26.2 seconds — ABNORMAL HIGH (ref 11.6–15.2)

## 2014-02-18 NOTE — Discharge Instructions (Signed)
° °  Supplemental Discharge Instructions for  °Pacemaker/Defibrillator Patients ° °Activity °No heavy lifting or vigorous activity with your left/right arm for 6 to 8 weeks.  Do not raise your left/right arm above your head for one week.  Gradually raise your affected arm as drawn below. ° °        ° °            11/26                   11/27                    11/28                    11/29           ° °NO DRIVING for  1 week    ; you may begin driving on     11/30     . °WOUND CARE °- Keep the wound area clean and dry.  Do not get this area wet for one week. No showers for one week; you may shower on      11/30        . °- The tape/steri-strips on your wound will fall off; do not pull them off.  No bandage is needed on the site.  DO  NOT apply any creams, oils, or ointments to the wound area. °- If you notice any drainage or discharge from the wound, any swelling or bruising at the site, or you develop a fever > 101? F after you are discharged home, call the office at once. ° °Special Instructions °- You are still able to use cellular telephones; use the ear opposite the side where you have your pacemaker/defibrillator.  Avoid carrying your cellular phone near your device. °- When traveling through airports, show security personnel your identification card to avoid being screened in the metal detectors.  Ask the security personnel to use the hand wand. °- Avoid arc welding equipment, MRI testing (magnetic resonance imaging), TENS units (transcutaneous nerve stimulators).  Call the office for questions about other devices. °- Avoid electrical appliances that are in poor condition or are not properly grounded. °- Microwave ovens are safe to be near or to operate. ° °Additional information for defibrillator patients should your device go off: °- If your device goes off ONCE and you feel fine afterward, notify the device clinic nurses. °- If your device goes off ONCE and you do not feel well afterward, call 911. °- If  your device goes off TWICE, call 911. °- If your device goes off THREE times in one day, call 911. ° °DO NOT DRIVE YOURSELF OR A FAMILY MEMBER °WITH A DEFIBRILLATOR TO THE HOSPITAL--CALL 911. ° °

## 2014-02-18 NOTE — Progress Notes (Signed)
Discharge education, medication, prescriptions, and follow-up appts reviewed with pt and his wife, pacemaker education given concerning, incision care, activity, lifting, bathing, and, and monitor, both stated understanding and that they had no questions, IV and tele removed Archie Balboa, RN

## 2014-02-18 NOTE — Telephone Encounter (Signed)
Order for monitor was not placed during last OV, order was put in and released. Status changed to "In Process"

## 2014-02-19 ENCOUNTER — Telehealth: Payer: Self-pay | Admitting: Internal Medicine

## 2014-02-19 ENCOUNTER — Ambulatory Visit (INDEPENDENT_AMBULATORY_CARE_PROVIDER_SITE_OTHER): Payer: Medicare HMO | Admitting: *Deleted

## 2014-02-19 DIAGNOSIS — I5043 Acute on chronic combined systolic (congestive) and diastolic (congestive) heart failure: Secondary | ICD-10-CM

## 2014-02-19 LAB — MDC_IDC_ENUM_SESS_TYPE_INCLINIC
Battery Voltage: 3.07 V
Brady Statistic AP VP Percent: 51.83 %
Brady Statistic AP VS Percent: 0.34 %
Brady Statistic AS VS Percent: 1.85 %
Brady Statistic RV Percent Paced: 97.81 %
Date Time Interrogation Session: 20151125164523
Lead Channel Impedance Value: 361 Ohm
Lead Channel Impedance Value: 399 Ohm
Lead Channel Impedance Value: 437 Ohm
Lead Channel Impedance Value: 494 Ohm
Lead Channel Impedance Value: 589 Ohm
Lead Channel Impedance Value: 627 Ohm
Lead Channel Impedance Value: 646 Ohm
Lead Channel Pacing Threshold Amplitude: 0.5 V
Lead Channel Pacing Threshold Pulse Width: 0.4 ms
Lead Channel Sensing Intrinsic Amplitude: 1 mV
Lead Channel Sensing Intrinsic Amplitude: 1.25 mV
Lead Channel Setting Pacing Amplitude: 2.75 V
Lead Channel Setting Pacing Pulse Width: 0.4 ms
Lead Channel Setting Pacing Pulse Width: 0.4 ms
Lead Channel Setting Sensing Sensitivity: 0.9 mV
MDC IDC MSMT LEADCHNL LV IMPEDANCE VALUE: 285 Ohm
MDC IDC MSMT LEADCHNL LV PACING THRESHOLD AMPLITUDE: 1.25 V
MDC IDC MSMT LEADCHNL LV PACING THRESHOLD PULSEWIDTH: 0.4 ms
MDC IDC MSMT LEADCHNL RA PACING THRESHOLD PULSEWIDTH: 0.4 ms
MDC IDC MSMT LEADCHNL RV IMPEDANCE VALUE: 551 Ohm
MDC IDC MSMT LEADCHNL RV PACING THRESHOLD AMPLITUDE: 0.5 V
MDC IDC MSMT LEADCHNL RV SENSING INTR AMPL: 20 mV
MDC IDC MSMT LEADCHNL RV SENSING INTR AMPL: 8.125 mV
MDC IDC SET LEADCHNL RA PACING AMPLITUDE: 3.5 V
MDC IDC SET LEADCHNL RV PACING AMPLITUDE: 3.5 V
MDC IDC SET ZONE DETECTION INTERVAL: 150 ms
MDC IDC STAT BRADY AS VP PERCENT: 45.98 %
MDC IDC STAT BRADY RA PERCENT PACED: 52.17 %
Zone Setting Detection Interval: 400 ms

## 2014-02-19 NOTE — Telephone Encounter (Signed)
New Msg   Patient wife calling about concern with Pacemaker. Patient is stating he can feel a hard thumping on his left side, they are concerned about whether or not this is normal. Please contact wife at [phone].

## 2014-02-19 NOTE — Telephone Encounter (Signed)
Appt made with Device Clinic @ 4:00pm today. Wife voiced understanding.

## 2014-02-19 NOTE — Progress Notes (Signed)
CRT-P device check in clinic for "thumping". Normal device function. Thresholds, sensing, impedance consistent with previous measurements. Histograms appropriate for patient and level of activity. No mode switches or ventricular high rate episodes. Patient bi-ventricularly pacing 92% of the time with 1.9% as VSRp. LV output decreased from 4.0V to 2.75V @ 0.15ms and LV adaptive auto cap D/C'd---ok per GT. Batt voltage 3.07V (ERI 2.77V).  Patient to follow up with wound check on 12-3.

## 2014-02-21 ENCOUNTER — Encounter (HOSPITAL_BASED_OUTPATIENT_CLINIC_OR_DEPARTMENT_OTHER): Payer: Self-pay | Admitting: Emergency Medicine

## 2014-02-21 ENCOUNTER — Emergency Department (HOSPITAL_BASED_OUTPATIENT_CLINIC_OR_DEPARTMENT_OTHER): Payer: Medicare HMO

## 2014-02-21 ENCOUNTER — Emergency Department (HOSPITAL_BASED_OUTPATIENT_CLINIC_OR_DEPARTMENT_OTHER)
Admission: EM | Admit: 2014-02-21 | Discharge: 2014-02-22 | Disposition: A | Discharge status: Home / Self Care | Payer: Medicare HMO | Attending: Emergency Medicine | Admitting: Emergency Medicine

## 2014-02-21 DIAGNOSIS — E785 Hyperlipidemia, unspecified: Secondary | ICD-10-CM | POA: Diagnosis not present

## 2014-02-21 DIAGNOSIS — Z951 Presence of aortocoronary bypass graft: Secondary | ICD-10-CM | POA: Insufficient documentation

## 2014-02-21 DIAGNOSIS — Z954 Presence of other heart-valve replacement: Secondary | ICD-10-CM | POA: Diagnosis not present

## 2014-02-21 DIAGNOSIS — J441 Chronic obstructive pulmonary disease with (acute) exacerbation: Secondary | ICD-10-CM | POA: Diagnosis not present

## 2014-02-21 DIAGNOSIS — K219 Gastro-esophageal reflux disease without esophagitis: Secondary | ICD-10-CM | POA: Insufficient documentation

## 2014-02-21 DIAGNOSIS — Z7901 Long term (current) use of anticoagulants: Secondary | ICD-10-CM | POA: Insufficient documentation

## 2014-02-21 DIAGNOSIS — Z87891 Personal history of nicotine dependence: Secondary | ICD-10-CM | POA: Diagnosis not present

## 2014-02-21 DIAGNOSIS — Z79899 Other long term (current) drug therapy: Secondary | ICD-10-CM | POA: Insufficient documentation

## 2014-02-21 DIAGNOSIS — R0602 Shortness of breath: Secondary | ICD-10-CM | POA: Diagnosis present

## 2014-02-21 DIAGNOSIS — I129 Hypertensive chronic kidney disease with stage 1 through stage 4 chronic kidney disease, or unspecified chronic kidney disease: Secondary | ICD-10-CM | POA: Insufficient documentation

## 2014-02-21 DIAGNOSIS — I2510 Atherosclerotic heart disease of native coronary artery without angina pectoris: Secondary | ICD-10-CM | POA: Diagnosis not present

## 2014-02-21 DIAGNOSIS — Z8739 Personal history of other diseases of the musculoskeletal system and connective tissue: Secondary | ICD-10-CM | POA: Insufficient documentation

## 2014-02-21 DIAGNOSIS — M1990 Unspecified osteoarthritis, unspecified site: Secondary | ICD-10-CM | POA: Insufficient documentation

## 2014-02-21 DIAGNOSIS — F419 Anxiety disorder, unspecified: Secondary | ICD-10-CM | POA: Diagnosis not present

## 2014-02-21 DIAGNOSIS — R0600 Dyspnea, unspecified: Secondary | ICD-10-CM

## 2014-02-21 DIAGNOSIS — N183 Chronic kidney disease, stage 3 (moderate): Secondary | ICD-10-CM | POA: Insufficient documentation

## 2014-02-21 DIAGNOSIS — Z8673 Personal history of transient ischemic attack (TIA), and cerebral infarction without residual deficits: Secondary | ICD-10-CM | POA: Diagnosis not present

## 2014-02-21 DIAGNOSIS — I5042 Chronic combined systolic (congestive) and diastolic (congestive) heart failure: Secondary | ICD-10-CM | POA: Diagnosis not present

## 2014-02-21 DIAGNOSIS — E559 Vitamin D deficiency, unspecified: Secondary | ICD-10-CM | POA: Diagnosis not present

## 2014-02-21 DIAGNOSIS — Z880 Allergy status to penicillin: Secondary | ICD-10-CM | POA: Insufficient documentation

## 2014-02-21 DIAGNOSIS — Z950 Presence of cardiac pacemaker: Secondary | ICD-10-CM | POA: Insufficient documentation

## 2014-02-21 HISTORY — DX: Chronic obstructive pulmonary disease, unspecified: J44.9

## 2014-02-21 NOTE — ED Notes (Signed)
Pt placed on heart monitor.

## 2014-02-21 NOTE — ED Notes (Addendum)
Patient has had a decrease in Urine out put over the last few days. Was just Discharged from the hospital due to pacemaker placement. Patient states that he has been shakey and SOB for the last 2 -3 days.

## 2014-02-22 ENCOUNTER — Encounter (HOSPITAL_BASED_OUTPATIENT_CLINIC_OR_DEPARTMENT_OTHER): Payer: Self-pay | Admitting: Emergency Medicine

## 2014-02-22 LAB — CBC WITH DIFFERENTIAL/PLATELET
BASOS PCT: 1 % (ref 0–1)
Basophils Absolute: 0.1 10*3/uL (ref 0.0–0.1)
Eosinophils Absolute: 0.1 10*3/uL (ref 0.0–0.7)
Eosinophils Relative: 2 % (ref 0–5)
HCT: 37.4 % — ABNORMAL LOW (ref 39.0–52.0)
Hemoglobin: 12.6 g/dL — ABNORMAL LOW (ref 13.0–17.0)
Lymphocytes Relative: 14 % (ref 12–46)
Lymphs Abs: 0.9 10*3/uL (ref 0.7–4.0)
MCH: 33.6 pg (ref 26.0–34.0)
MCHC: 33.7 g/dL (ref 30.0–36.0)
MCV: 99.7 fL (ref 78.0–100.0)
MONO ABS: 0.7 10*3/uL (ref 0.1–1.0)
Monocytes Relative: 10 % (ref 3–12)
NEUTROS ABS: 4.9 10*3/uL (ref 1.7–7.7)
Neutrophils Relative %: 73 % (ref 43–77)
PLATELETS: 218 10*3/uL (ref 150–400)
RBC: 3.75 MIL/uL — ABNORMAL LOW (ref 4.22–5.81)
RDW: 13.8 % (ref 11.5–15.5)
WBC: 6.7 10*3/uL (ref 4.0–10.5)

## 2014-02-22 LAB — BASIC METABOLIC PANEL
Anion gap: 15 (ref 5–15)
BUN: 33 mg/dL — ABNORMAL HIGH (ref 6–23)
CO2: 27 mEq/L (ref 19–32)
CREATININE: 1.2 mg/dL (ref 0.50–1.35)
Calcium: 9.3 mg/dL (ref 8.4–10.5)
Chloride: 96 mEq/L (ref 96–112)
GFR calc non Af Amer: 55 mL/min — ABNORMAL LOW (ref >90)
GFR, EST AFRICAN AMERICAN: 64 mL/min — AB (ref >90)
Glucose, Bld: 111 mg/dL — ABNORMAL HIGH (ref 70–99)
POTASSIUM: 4.3 meq/L (ref 3.7–5.3)
Sodium: 138 mEq/L (ref 137–147)

## 2014-02-22 LAB — TROPONIN I: Troponin I: <0.3 ng/mL (ref <0.30)

## 2014-02-22 LAB — PRO B NATRIURETIC PEPTIDE: Pro B Natriuretic peptide (BNP): 19705 pg/mL — ABNORMAL HIGH (ref 0–450)

## 2014-02-22 MED ORDER — ALBUTEROL SULFATE HFA 108 (90 BASE) MCG/ACT IN AERS
1.0000 | INHALATION_SPRAY | RESPIRATORY_TRACT | Status: DC | PRN
Start: 2014-02-22 — End: 2014-02-22
  Administered 2014-02-22: 2 via RESPIRATORY_TRACT
  Filled 2014-02-22: qty 6.7

## 2014-02-22 NOTE — Patient Instructions (Signed)
Instructed patient on the proper use of administering albuterol mdi via aerochamber patient tolerated well 

## 2014-02-22 NOTE — ED Provider Notes (Signed)
CSN: [id number]     Arrival date & time 02/21/14  2124 History   First MD Initiated Contact with Patient 02/21/14 2212     Chief Complaint  Patient presents with  . Shortness of Breath     (Consider location/radiation/quality/duration/timing/severity/associated sxs/prior Treatment) HPI This is an 80 year old male who was admitted for congestive heart failure on the 15th of this month and had a pacemaker placed on the 23rd of this month. He is here with episodic shortness of breath throughout the day today. It is not exacerbated by exertion. It is worse when lying supine. He denies chest pain, nausea, vomiting or diarrhea. He has felt alternately hot and cold but is not aware of having a fever. He has an occasional productive cough. His shortness of breath is mild the present time. He states he has been diagnosed with COPD in the past but has never used an inhaler. He has had decreased urine output despite taking Lasix 40 milligrams twice daily.  Past Medical History  Diagnosis Date  . Arthritis   . Non-obstructive CAD     a. 12/2013 NSTEMI/Cath: LM nl, LAD 20, LCX 40-50, RCA 20.  Marland Kitchen Hypertension   . Stroke     a. July 2000;  b. January 2007;  c. TIA in 2014.  . CKD (chronic kidney disease), stage III   . GERD (gastroesophageal reflux disease)   . Foot pain, bilateral   . H/O hematuria   . Hyperlipidemia   . Vitamin D deficiency   . Chronic combined systolic and diastolic CHF (congestive heart failure)     a. 12/2013 Echo: EF 30-35%, mod conc LVH, Gr 3 DD, mild AI, mod-sev MR, mod dil LA, mild TR.  Marland Kitchen PAF (paroxysmal atrial fibrillation)     a. CHA2DS2VASc = 7--> Chronic Coumadin.  Marland Kitchen NICM (nonischemic cardiomyopathy)     a. 12/2013 Echo: EF 30-35%.  . Aortic valve prosthesis present     a. 2006: S/P bioprosthetic AVR.  Marland Kitchen PAD (peripheral artery disease)     a. s/p LLE stenting.  . Carotid arterial disease     a. s/p R CEA  . History of tobacco abuse   . Moderate to Severe Mitral  Regurgitation     a. 12/2013 Echo: Mod-Sev MR.  Marland Kitchen Anxiety   . COPD (chronic obstructive pulmonary disease)    Past Surgical History  Procedure Laterality Date  . Aortic valve replacement (avr)/coronary artery bypass grafting (cabg)  12/2008  . Eye surgery    . Carotid angiogram  1996  . Coronary artery bypass graft    . Left lower extremity stent    . Carotid endarterectomy    . Insert / replace / remove pacemaker     Family History  Problem Relation Age of Onset  . Stroke Mother   . Cancer Father    History  Substance Use Topics  . Smoking status: Former Smoker    Quit date: 09/26/1998  . Smokeless tobacco: Never Used  . Alcohol Use: 0.6 oz/week    1 Cans of beer per week     Comment: drinks one can of beer daily.    Review of Systems  All other systems reviewed and are negative.   Allergies  Ambien; Ativan; Penicillins; and Sulfa antibiotics  Home Medications   Prior to Admission medications   Medication Sig Start Date End Date Taking? Authorizing Provider  acetaminophen (TYLENOL) 500 MG tablet Take 1,000 mg by mouth every 6 (six) hours as needed for  mild pain or headache.     Historical Provider, MD  alendronate (FOSAMAX) 70 MG tablet Take 70 mg by mouth once a week. sunday 02/28/13   Historical Provider, MD  allopurinol (ZYLOPRIM) 100 MG tablet Take 200 mg by mouth daily.    Historical Provider, MD  atorvastatin (LIPITOR) 40 MG tablet Take 40 mg by mouth every evening.    Historical Provider, MD  Calcium Carb-Cholecalciferol (CALCIUM 600 + D PO) Take 2 tablets by mouth every morning.    Historical Provider, MD  clopidogrel (PLAVIX) 75 MG tablet Take 75 mg by mouth daily.    Historical Provider, MD  DULoxetine (CYMBALTA) 30 MG capsule Take 30 mg by mouth daily.    Historical Provider, MD  furosemide (LASIX) 80 MG tablet Take 0.5 tablets (40 mg total) by mouth 2 (two) times daily. 02/11/14   Dwana Melena, PA-C  metoprolol tartrate (LOPRESSOR) 25 MG tablet Take 1  tablet (25 mg total) by mouth 2 (two) times daily. 01/16/14   Osvaldo Shipper, MD  Omega-3 Fatty Acids (FISH OIL) 1200 MG CAPS Take 1 capsule by mouth every morning.    Historical Provider, MD  omeprazole (PRILOSEC) 40 MG capsule Take 40 mg by mouth every morning.    Historical Provider, MD  warfarin (COUMADIN) 1 MG tablet Take 1 mg by mouth once a week. On Sunday    Historical Provider, MD  warfarin (COUMADIN) 4 MG tablet Take 4 mg by mouth See admin instructions. Monday-Saturday 02/24/13   Historical Provider, MD   BP 148/54 mmHg  Pulse 68  Temp(Src) 97.7 F (36.5 C) (Oral)  Resp 20  Wt 166 lb (75.297 kg)  SpO2 95%   Physical Exam  General: Well-developed, well-nourished male in no acute distress; appearance consistent with age of record HENT: normocephalic; atraumatic Eyes: pupils equal, round and reactive to light; extraocular muscles intact; lens implants Neck: supple Heart: regular rate and rhythm; frequent ectopy; faint systolic murmur right upper sternal border Lungs: clear to auscultation bilaterally; distant sounds Abdomen: soft; nondistended; nontender; no masses or hepatosplenomegaly; bowel sounds present Extremities: No deformity; full range of motion; pulses normal; trace edema of lower legs Neurologic: Awake, alert and oriented; motor function intact in all extremities and symmetric; no facial droop Skin: Warm and dry Psychiatric: Normal mood and affect  ED Course  Procedures (including critical care time)   EKG Interpretation   Date/Time:  Friday February 21 2014 21:42:28 EST Ventricular Rate:  77 PR Interval:  116 QRS Duration: 162 QT Interval:  450 QTC Calculation: 509 R Axis:   -110 Text Interpretation:  Atrial-sensed ventricular-paced rhythm Abnormal ECG  No significant change was found Confirmed by Jaggar Benko  MD, Jonny Ruiz ([id number]) on  02/22/2014 12:27:00 AM      MDM  Nursing notes and vitals signs, including pulse oximetry, reviewed.  Summary of this  visit's results, reviewed by myself:  Labs:  Results for orders placed or performed during the hospital encounter of 02/21/14 (from the past 24 hour(s))  CBC with Differential     Status: Abnormal   Collection Time: 02/22/14  1:00 AM  Result Value Ref Range   WBC 6.7 4.0 - 10.5 K/uL   RBC 3.75 (L) 4.22 - 5.81 MIL/uL   Hemoglobin 12.6 (L) 13.0 - 17.0 g/dL   HCT 24.8 (L) 25.0 - 03.7 %   MCV 99.7 78.0 - 100.0 fL   MCH 33.6 26.0 - 34.0 pg   MCHC 33.7 30.0 - 36.0 g/dL   RDW 13.8  11.5 - 15.5 %   Platelets 218 150 - 400 K/uL   Neutrophils Relative % 73 43 - 77 %   Neutro Abs 4.9 1.7 - 7.7 K/uL   Lymphocytes Relative 14 12 - 46 %   Lymphs Abs 0.9 0.7 - 4.0 K/uL   Monocytes Relative 10 3 - 12 %   Monocytes Absolute 0.7 0.1 - 1.0 K/uL   Eosinophils Relative 2 0 - 5 %   Eosinophils Absolute 0.1 0.0 - 0.7 K/uL   Basophils Relative 1 0 - 1 %   Basophils Absolute 0.1 0.0 - 0.1 K/uL  Basic metabolic panel     Status: Abnormal   Collection Time: 02/22/14  1:00 AM  Result Value Ref Range   Sodium 138 137 - 147 mEq/L   Potassium 4.3 3.7 - 5.3 mEq/L   Chloride 96 96 - 112 mEq/L   CO2 27 19 - 32 mEq/L   Glucose, Bld 111 (H) 70 - 99 mg/dL   BUN 33 (H) 6 - 23 mg/dL   Creatinine, Ser 4.541.20 0.50 - 1.35 mg/dL   Calcium 9.3 8.4 - 09.810.5 mg/dL   GFR calc non Af Amer 55 (L) >90 mL/min   GFR calc Af Amer 64 (L) >90 mL/min   Anion gap 15 5 - 15  Pro b natriuretic peptide (BNP)     Status: Abnormal   Collection Time: 02/22/14  1:00 AM  Result Value Ref Range   Pro B Natriuretic peptide (BNP) 19705.0 (H) 0 - 450 pg/mL  Troponin I     Status: None   Collection Time: 02/22/14  1:00 AM  Result Value Ref Range   Troponin I <0.30 <0.30 ng/mL    Imaging Studies: Dg Chest 2 View  02/21/2014   CLINICAL DATA:  Shortness of Breath  EXAM: CHEST  2 VIEW  COMPARISON:  02/18/2014  FINDINGS: Cardiomediastinal silhouette is stable. Three leads cardiac pacemaker is unchanged in position. Bilateral small pleural  effusion right greater than left. There is right basilar atelectasis or infiltrate. Persistent loculated pleural fluid in right minor fissure. Central mild vascular congestion without convincing pulmonary edema.  IMPRESSION: Bilateral small pleural effusion right greater than left. Right basilar atelectasis or infiltrate. Persistent loculated fluid in right minor fissure. No convincing pulmonary edema. Three leads cardiac pacemaker in place.   Electronically Signed   By: Natasha MeadLiviu  Pop M.D.   On: 02/21/2014 22:20   2:05 AM Patient's dyspnea is still mild this time. There is no compelling indication for admission and the patient does not wish for admission. We will start him on an albuterol inhaler as he has never been treated with albuterol in the past.    Hanley SeamenJohn L Kehlani Vancamp, MD 02/22/14 [phone]

## 2014-02-24 ENCOUNTER — Telehealth: Payer: Self-pay | Admitting: Cardiology

## 2014-02-24 ENCOUNTER — Encounter: Payer: Self-pay | Admitting: Nurse Practitioner

## 2014-02-24 ENCOUNTER — Ambulatory Visit (INDEPENDENT_AMBULATORY_CARE_PROVIDER_SITE_OTHER): Payer: Medicare HMO | Admitting: Nurse Practitioner

## 2014-02-24 ENCOUNTER — Ambulatory Visit
Admission: RE | Admit: 2014-02-24 | Discharge: 2014-02-24 | Disposition: A | Discharge status: Home / Self Care | Payer: Commercial Managed Care - HMO | Source: Ambulatory Visit | Attending: Nurse Practitioner | Admitting: Nurse Practitioner

## 2014-02-24 ENCOUNTER — Ambulatory Visit (INDEPENDENT_AMBULATORY_CARE_PROVIDER_SITE_OTHER): Payer: Medicare HMO | Admitting: *Deleted

## 2014-02-24 VITALS — BP 120/58 | Ht 71.0 in | Wt 169.2 lb

## 2014-02-24 DIAGNOSIS — R0600 Dyspnea, unspecified: Secondary | ICD-10-CM

## 2014-02-24 DIAGNOSIS — I447 Left bundle-branch block, unspecified: Secondary | ICD-10-CM

## 2014-02-24 DIAGNOSIS — I5022 Chronic systolic (congestive) heart failure: Secondary | ICD-10-CM

## 2014-02-24 DIAGNOSIS — R300 Dysuria: Secondary | ICD-10-CM

## 2014-02-24 DIAGNOSIS — I5043 Acute on chronic combined systolic (congestive) and diastolic (congestive) heart failure: Secondary | ICD-10-CM

## 2014-02-24 DIAGNOSIS — I429 Cardiomyopathy, unspecified: Secondary | ICD-10-CM

## 2014-02-24 DIAGNOSIS — I428 Other cardiomyopathies: Secondary | ICD-10-CM

## 2014-02-24 DIAGNOSIS — I480 Paroxysmal atrial fibrillation: Secondary | ICD-10-CM

## 2014-02-24 DIAGNOSIS — Z950 Presence of cardiac pacemaker: Secondary | ICD-10-CM

## 2014-02-24 LAB — MDC_IDC_ENUM_SESS_TYPE_INCLINIC
Brady Statistic AP VP Percent: 43.98 %
Brady Statistic AP VS Percent: 0.11 %
Brady Statistic AS VP Percent: 53.87 %
Brady Statistic AS VS Percent: 2.04 %
Brady Statistic RV Percent Paced: 97.85 %
Date Time Interrogation Session: 20151130154436
Eval Rhythm: 1:1 {titer}
Lead Channel Impedance Value: 285 Ohm
Lead Channel Impedance Value: 342 Ohm
Lead Channel Impedance Value: 437 Ohm
Lead Channel Impedance Value: 456 Ohm
Lead Channel Impedance Value: 456 Ohm
Lead Channel Impedance Value: 551 Ohm
Lead Channel Impedance Value: 608 Ohm
Lead Channel Pacing Threshold Amplitude: 0.875 V
Lead Channel Pacing Threshold Pulse Width: 0.4 ms
Lead Channel Pacing Threshold Pulse Width: 0.4 ms
Lead Channel Sensing Intrinsic Amplitude: 1 mV
Lead Channel Sensing Intrinsic Amplitude: 1.5 mV
Lead Channel Sensing Intrinsic Amplitude: 22 mV
Lead Channel Sensing Intrinsic Amplitude: 7.375 mV
Lead Channel Setting Pacing Amplitude: 2.75 V
Lead Channel Setting Pacing Amplitude: 3.5 V
Lead Channel Setting Sensing Sensitivity: 0.9 mV
MDC IDC MSMT BATTERY VOLTAGE: 3.08 V
MDC IDC MSMT LEADCHNL LV PACING THRESHOLD AMPLITUDE: 0.75 V
MDC IDC MSMT LEADCHNL LV PACING THRESHOLD PULSEWIDTH: 0.4 ms
MDC IDC MSMT LEADCHNL RV IMPEDANCE VALUE: 494 Ohm
MDC IDC MSMT LEADCHNL RV IMPEDANCE VALUE: 608 Ohm
MDC IDC MSMT LEADCHNL RV PACING THRESHOLD AMPLITUDE: 0.75 V
MDC IDC SET LEADCHNL LV PACING PULSEWIDTH: 0.8 ms
MDC IDC SET LEADCHNL RV PACING AMPLITUDE: 3.5 V
MDC IDC SET LEADCHNL RV PACING PULSEWIDTH: 0.4 ms
MDC IDC SET ZONE DETECTION INTERVAL: 150 ms
MDC IDC SET ZONE DETECTION INTERVAL: 400 ms
MDC IDC STAT BRADY RA PERCENT PACED: 44.09 %

## 2014-02-24 MED ORDER — FUROSEMIDE 80 MG PO TABS: 80.0000 mg | ORAL_TABLET | Freq: Two times a day (BID) | ORAL | Status: DC

## 2014-02-24 MED ORDER — AZITHROMYCIN 250 MG PO TABS: ORAL_TABLET | ORAL | Status: DC

## 2014-02-24 NOTE — Patient Instructions (Addendum)
We will be checking the following labs today BMET, BNP, CBC, UA and Urine Culture  Please go to [facility] to [facility] on the first floor for a chest Xray - you may walk in.   Increase your lasix to 80 mg twice a day for 3 days, then go back to 40 mg twice a day  I am sending in an antibiotic to your drug store - take as directed  Coumadin check with your PCP Wednesday or Thursday  Keep your visit here for this Thursday   Call the [facility] office at [phone] if you have any questions, problems or concerns.

## 2014-02-24 NOTE — Telephone Encounter (Signed)
Returned call to [employer] at [employer].She stated she already spoke to a gentleman.Stated patient went to ER 02/21/14 with sob.Stated they told patient to call cardiologist.Spoke to patient he stated he is sob.Stated he went to ER 02/21/14 and was told to call heart Dr.Stated he feels bad,sob.Stated sob no worse than 02/21/14 but still is sob.Stated he has a non productive cough.No swelling.Stated he had a pacemaker implanted 02/17/14.Stated he cannot tell it has helped.Spoke to DOD Dr.Harding he advised patient will need pacemaker interrogated and it would be best if patient sees Dr.at [facility] to have pacemaker interrogated.Melissa appointment scheduler called no answer left message to call me back to schedule appt.

## 2014-02-24 NOTE — Telephone Encounter (Signed)
Received a call from [place] EP scheduler at [facility] scheduled with Norma Fredrickson NP this afternoon at 3:00 pm.Device will be checked.She already called patient's wife and she is aware of appointment.

## 2014-02-24 NOTE — Progress Notes (Signed)
Patient presents for device clinic pacemaker and wound check.  Problems with shortness of breath- in office to see Norma Fredrickson, NP.  No complaints of chest pain, palpitations, or syncope.  Device interrogated and found to be functioning normally-- increased LV output to ensure LV capture.  Kept device clinic appointment for Thurs 02/27/14, pt to contact office if he is feeling better to cancel.   See P[facility]Art report for full details.  Plan ROV with Dr. Ladona Ridgel 05/20/14.  Bethanie Dicker, RN, BSN 02/24/2014 3:36 PM

## 2014-02-24 NOTE — Progress Notes (Signed)
Janece Canterburyoy Mccomb Date of Birth: [birth year] Medical Record #[id number]  History of Present Illness: [name] is seen back today for a work in visit. Seen for Dr. Antoine PocheHochrein and Ladona Ridgelaylor. He is an 80 year old male with a NICM with chronic systolic HF and LBBB. Has had elective CRTP about 10 days ago per Dr. Ladona Ridgelaylor. Other issues include non obstructive CAD, HTN, prior CVA, CKD, HLD, PAF and prior AVR. He is on chronic coumadin therapy - checked by his PCP.  He was in the ER over the weekend with dyspnea. Negative evaluation but BNP was higher. CXR with effusions noted. Given albuterol inhaler.  Comes in here today. Here with his wife. He notes that he was initially feeling ok since his pacemaker implant - but on Friday - felt more short of breath - went to the ER. The inhaler is not helping. He has been coughing up stuff that "looks like egg whites". Not frothy. No swelling. No PND/orthopnea. Weight is going up. Tries to avoid salt. No PND/orthopnea. Some chills as well. No actual fever. Has had his flu shot. No sick contacts. Some dysuria as well.   Current Outpatient Prescriptions  Medication Sig Dispense Refill  . acetaminophen (TYLENOL) 500 MG tablet Take 1,000 mg by mouth every 6 (six) hours as needed for mild pain or headache.     . albuterol (PROVENTIL HFA;VENTOLIN HFA) 108 (90 BASE) MCG/ACT inhaler Inhale 2 puffs into the lungs every 6 (six) hours as needed for wheezing or shortness of breath.    Marland Kitchen. alendronate (FOSAMAX) 70 MG tablet Take 70 mg by mouth once a week. sunday    . allopurinol (ZYLOPRIM) 100 MG tablet Take 200 mg by mouth daily.    Marland Kitchen. atorvastatin (LIPITOR) 40 MG tablet Take 40 mg by mouth every evening.    . Calcium Carb-Cholecalciferol (CALCIUM 600 + D PO) Take 2 tablets by mouth every morning.    . clopidogrel (PLAVIX) 75 MG tablet Take 75 mg by mouth daily.    . DULoxetine (CYMBALTA) 30 MG capsule Take 30 mg by mouth daily.    . furosemide (LASIX) 80 MG tablet Take 0.5 tablets  (40 mg total) by mouth 2 (two) times daily. 60 tablet 5  . metoprolol tartrate (LOPRESSOR) 25 MG tablet Take 1 tablet (25 mg total) by mouth 2 (two) times daily. 60 tablet 3  . Omega-3 Fatty Acids (FISH OIL) 1200 MG CAPS Take 1 capsule by mouth every morning.    Marland Kitchen. omeprazole (PRILOSEC) 40 MG capsule Take 40 mg by mouth every morning.    . warfarin (COUMADIN) 1 MG tablet Take 1 mg by mouth once a week. On Sunday    . warfarin (COUMADIN) 4 MG tablet Take 4 mg by mouth See admin instructions. Monday-Saturday     No current facility-administered medications for this visit.    Allergies  Allergen Reactions  . Ambien [Zolpidem] Other (See Comments)    Hallucinations  . Ativan [Lorazepam] Other (See Comments)    hallucinations  . Penicillins     Unknown childhood reaction   . Sulfa Antibiotics     unknown    Past Medical History  Diagnosis Date  . Arthritis   . Non-obstructive CAD     a. 12/2013 NSTEMI/Cath: LM nl, LAD 20, LCX 40-50, RCA 20.  Marland Kitchen. Hypertension   . Stroke     a. July 2000;  b. January 2007;  c. TIA in 2014.  . CKD (chronic kidney disease), stage III   .  GERD (gastroesophageal reflux disease)   . Foot pain, bilateral   . H/O hematuria   . Hyperlipidemia   . Vitamin D deficiency   . Chronic combined systolic and diastolic CHF (congestive heart failure)     a. 12/2013 Echo: EF 30-35%, mod conc LVH, Gr 3 DD, mild AI, mod-sev MR, mod dil LA, mild TR.  Marland Kitchen PAF (paroxysmal atrial fibrillation)     a. CHA2DS2VASc = 7--> Chronic Coumadin.  Marland Kitchen NICM (nonischemic cardiomyopathy)     a. 12/2013 Echo: EF 30-35%.  . Aortic valve prosthesis present     a. 2006: S/P bioprosthetic AVR.  Marland Kitchen PAD (peripheral artery disease)     a. s/p LLE stenting.  . Carotid arterial disease     a. s/p R CEA  . History of tobacco abuse   . Moderate to Severe Mitral Regurgitation     a. 12/2013 Echo: Mod-Sev MR.  Marland Kitchen Anxiety   . COPD (chronic obstructive pulmonary disease)     Past Surgical History    Procedure Laterality Date  . Aortic valve replacement (avr)/coronary artery bypass grafting (cabg)  12/2008  . Eye surgery    . Carotid angiogram  1996  . Coronary artery bypass graft    . Left lower extremity stent    . Carotid endarterectomy    . Insert / replace / remove pacemaker      History  Smoking status  . Former Smoker  . Quit date: 09/26/1998  Smokeless tobacco  . Never Used    History  Alcohol Use  . 0.6 oz/week  . 1 Cans of beer per week    Comment: drinks one can of beer daily.    Family History  Problem Relation Age of Onset  . Stroke Mother   . Cancer Father     Review of Systems: The review of systems is per the HPI.  All other systems were reviewed and are negative.  Physical Exam: BP 120/58 mmHg  Ht 5\' 11"  (1.803 m)  Wt 169 lb 3.2 oz (76.749 kg)  BMI 23.61 kg/m2 Patient is very pleasant and in no acute distress. He looks chronically ill. Skin is warm and dry. Color is normal.  HEENT is unremarkable. Normocephalic/atraumatic. PERRL. Sclera are nonicteric. Neck is supple. No masses. No JVD. Lungs are clear but decreased - especially right base. Device in the left upper chest with minimal swelling/bruising. Cardiac exam shows a regular rate and rhythm. Abdomen is soft. Extremities are without edema. Gait and ROM are intact. No gross neurologic deficits noted.  Wt Readings from Last 3 Encounters:  02/24/14 169 lb 3.2 oz (76.749 kg)  02/21/14 166 lb (75.297 kg)  02/17/14 164 lb (74.39 kg)    LABORATORY DATA/PROCEDURES: PENDING   Lab Results  Component Value Date   WBC 6.7 02/22/2014   HGB 12.6* 02/22/2014   HCT 37.4* 02/22/2014   PLT 218 02/22/2014   GLUCOSE 111* 02/22/2014   CHOL 146 03/26/2013   TRIG 91 03/26/2013   HDL 47 03/26/2013   LDLCALC 81 03/26/2013   NA 138 02/22/2014   K 4.3 02/22/2014   CL 96 02/22/2014   CREATININE 1.20 02/22/2014   BUN 33* 02/22/2014   CO2 27 02/22/2014   INR 2.38* 02/18/2014   HGBA1C 5.7* 03/26/2013    Lab Results  Component Value Date   INR 2.38* 02/18/2014   INR 2.34* 02/17/2014   INR 2.74* 02/13/2014    BNP (last 3 results)  Recent Labs  01/15/14 0349 02/09/14  1730 02/22/14 0100  PROBNP 9432.0* 17899.0* 19705.0*     CHEST 2 VIEW  COMPARISON: 02/18/2014  FINDINGS: Cardiomediastinal silhouette is stable. Three leads cardiac pacemaker is unchanged in position. Bilateral small pleural effusion right greater than left. There is right basilar atelectasis or infiltrate. Persistent loculated pleural fluid in right minor fissure. Central mild vascular congestion without convincing pulmonary edema.  IMPRESSION: Bilateral small pleural effusion right greater than left. Right basilar atelectasis or infiltrate. Persistent loculated fluid in right minor fissure. No convincing pulmonary edema. Three leads cardiac pacemaker in place.  Electronically Signed  By: Natasha Mead M.D.  On: 02/21/2014 22:20   Echo Study Conclusions from October 2015  - Left ventricle: There is global hypokinesis with akinesis of the basal and mid inferolateral walls. The cavity size was moderately dilated. There was moderate concentric hypertrophy. Systolic function was moderately to severely reduced. The estimated ejection fraction was in the range of 30% to 35%. Doppler parameters are consistent with a restrictive pattern, indicative of decreased left ventricular diastolic compliance and/or increased left atrial pressure (grade 3 diastolic dysfunction). Doppler parameters are consistent with elevated ventricular end-diastolic filling pressure. - Aortic valve: Bioprosthetic valve seems to be functioning normally. Normal transaortic gradients. There is mild central regurgitation and no paravalvular leak. There was mild regurgitation directed eccentrically in the LVOT. Valve area (Vmax): 0.96 cm^2. - Aorta: The aorta was normal, not dilated, and  non-diseased. Aortic root dimension: 40 mm (ED). - Mitral valve: There was moderate to severe regurgitation. - Left atrium: The atrium was moderately dilated. - Right ventricle: Systolic function was moderately reduced. - Atrial septum: No defect or patent foramen ovale was identified. - Tricuspid valve: There was mild regurgitation.  Impressions:  - When compared to the prior study from 03/26/2013 the left ventricle is now moderately dilated with moderate to severe systolic dysfunction and estimated LVEF 30-35%, previously 40-45%. There is restrictive pattern of diastolic dysfunction with elevated filling pressures. Mitral regurgitation is moderate to severe, previosuly mild. Right ventricular systolic function is moderately reduced.    Assessment / Plan: 1. Dyspnea - has NICM with chronic systolic HF - recent BNP higher. May have URI as well. Will increase Lasix for 3 days, give Z pack as directed. Recheck his labs and CXR today. Check UA and culture as well.   2. Recent CRTP implanted - device checked by the clinic staff today and adjusted accordingly. His site looks ok. Will be sending for CXR as well today.   3. NICM  4. Pleural effusion -  Will diurese  5. Nonobstructive CAD -  No active chest pain.   6. Chronic coumadin therapy - will get INR this Wednesday or Thursday  He is coming here Thursday to check his wound - will keep that visit. Wife will call if he does not improve.   Patient is agreeable to this plan and will call if any problems develop in the interim.   Rosalio Macadamia, RN, ANP-C [facility] [address] [place], [place]  [address] [phone]

## 2014-02-25 LAB — CBC WITH DIFFERENTIAL/PLATELET
Basophils Absolute: 0 10*3/uL (ref 0.0–0.1)
Basophils Relative: 0.3 % (ref 0.0–3.0)
Eosinophils Absolute: 0.2 10*3/uL (ref 0.0–0.7)
Eosinophils Relative: 2.6 % (ref 0.0–5.0)
HCT: 40.1 % (ref 39.0–52.0)
Hemoglobin: 13.1 g/dL (ref 13.0–17.0)
Lymphocytes Relative: 14 % (ref 12.0–46.0)
Lymphs Abs: 1 10*3/uL (ref 0.7–4.0)
MCHC: 32.6 g/dL (ref 30.0–36.0)
MCV: 102.2 fl — ABNORMAL HIGH (ref 78.0–100.0)
Monocytes Absolute: 0.3 10*3/uL (ref 0.1–1.0)
Monocytes Relative: 5.2 % (ref 3.0–12.0)
Neutro Abs: 5.3 10*3/uL (ref 1.4–7.7)
Neutrophils Relative %: 77.9 % — ABNORMAL HIGH (ref 43.0–77.0)
Platelets: 237 10*3/uL (ref 150.0–400.0)
RBC: 3.92 Mil/uL — ABNORMAL LOW (ref 4.22–5.81)
RDW: 15.8 % — ABNORMAL HIGH (ref 11.5–15.5)
WBC: 6.8 10*3/uL (ref 4.0–10.5)

## 2014-02-25 LAB — BASIC METABOLIC PANEL
BUN: 27 mg/dL — ABNORMAL HIGH (ref 6–23)
CO2: 28 mEq/L (ref 19–32)
Calcium: 8.7 mg/dL (ref 8.4–10.5)
Chloride: 98 mEq/L (ref 96–112)
Creatinine, Ser: 1.3 mg/dL (ref 0.4–1.5)
GFR: 54.33 mL/min — ABNORMAL LOW (ref >60.00)
Glucose, Bld: 78 mg/dL (ref 70–99)
Potassium: 4 mEq/L (ref 3.5–5.1)
Sodium: 135 mEq/L (ref 135–145)

## 2014-02-25 LAB — BRAIN NATRIURETIC PEPTIDE: Pro B Natriuretic peptide (BNP): 2973 pg/mL — ABNORMAL HIGH (ref 0.0–100.0)

## 2014-02-25 LAB — URINALYSIS
Bilirubin Urine: NEGATIVE
Hgb urine dipstick: NEGATIVE
Ketones, ur: NEGATIVE
Leukocytes, UA: NEGATIVE
Nitrite: NEGATIVE
Specific Gravity, Urine: 1.01 (ref 1.000–1.030)
Urine Glucose: NEGATIVE
Urobilinogen, UA: 0.2 (ref 0.0–1.0)
pH: 6 (ref 5.0–8.0)

## 2014-02-25 NOTE — Progress Notes (Signed)
Noted. Agree with plan to talk with PCP.

## 2014-02-26 ENCOUNTER — Encounter (HOSPITAL_COMMUNITY): Payer: Self-pay | Admitting: Adult Health

## 2014-02-26 ENCOUNTER — Telehealth: Payer: Self-pay | Admitting: Internal Medicine

## 2014-02-26 ENCOUNTER — Emergency Department (HOSPITAL_COMMUNITY): Payer: Medicare HMO

## 2014-02-26 ENCOUNTER — Inpatient Hospital Stay (HOSPITAL_COMMUNITY)
Admission: EM | Admit: 2014-02-26 | Discharge: 2014-03-03 | DRG: 291 | Disposition: A | Discharge status: Home Health | Payer: Medicare HMO | Attending: Cardiology | Admitting: Cardiology

## 2014-02-26 DIAGNOSIS — I5043 Acute on chronic combined systolic (congestive) and diastolic (congestive) heart failure: Secondary | ICD-10-CM | POA: Diagnosis not present

## 2014-02-26 DIAGNOSIS — Z882 Allergy status to sulfonamides status: Secondary | ICD-10-CM

## 2014-02-26 DIAGNOSIS — Z952 Presence of prosthetic heart valve: Secondary | ICD-10-CM

## 2014-02-26 DIAGNOSIS — Z888 Allergy status to other drugs, medicaments and biological substances status: Secondary | ICD-10-CM

## 2014-02-26 DIAGNOSIS — Z880 Allergy status to penicillin: Secondary | ICD-10-CM

## 2014-02-26 DIAGNOSIS — Z951 Presence of aortocoronary bypass graft: Secondary | ICD-10-CM | POA: Diagnosis not present

## 2014-02-26 DIAGNOSIS — E43 Unspecified severe protein-calorie malnutrition: Secondary | ICD-10-CM | POA: Insufficient documentation

## 2014-02-26 DIAGNOSIS — Z8673 Personal history of transient ischemic attack (TIA), and cerebral infarction without residual deficits: Secondary | ICD-10-CM | POA: Diagnosis not present

## 2014-02-26 DIAGNOSIS — I429 Cardiomyopathy, unspecified: Secondary | ICD-10-CM | POA: Diagnosis present

## 2014-02-26 DIAGNOSIS — J449 Chronic obstructive pulmonary disease, unspecified: Secondary | ICD-10-CM | POA: Diagnosis present

## 2014-02-26 DIAGNOSIS — I5023 Acute on chronic systolic (congestive) heart failure: Principal | ICD-10-CM | POA: Diagnosis present

## 2014-02-26 DIAGNOSIS — N183 Chronic kidney disease, stage 3 unspecified: Secondary | ICD-10-CM | POA: Diagnosis present

## 2014-02-26 DIAGNOSIS — I739 Peripheral vascular disease, unspecified: Secondary | ICD-10-CM | POA: Diagnosis present

## 2014-02-26 DIAGNOSIS — I340 Nonrheumatic mitral (valve) insufficiency: Secondary | ICD-10-CM | POA: Diagnosis present

## 2014-02-26 DIAGNOSIS — R339 Retention of urine, unspecified: Secondary | ICD-10-CM | POA: Diagnosis not present

## 2014-02-26 DIAGNOSIS — Z6822 Body mass index (BMI) 22.0-22.9, adult: Secondary | ICD-10-CM

## 2014-02-26 DIAGNOSIS — K219 Gastro-esophageal reflux disease without esophagitis: Secondary | ICD-10-CM | POA: Diagnosis present

## 2014-02-26 DIAGNOSIS — R634 Abnormal weight loss: Secondary | ICD-10-CM | POA: Insufficient documentation

## 2014-02-26 DIAGNOSIS — I480 Paroxysmal atrial fibrillation: Secondary | ICD-10-CM | POA: Diagnosis not present

## 2014-02-26 DIAGNOSIS — Z7901 Long term (current) use of anticoagulants: Secondary | ICD-10-CM | POA: Diagnosis not present

## 2014-02-26 DIAGNOSIS — Z7902 Long term (current) use of antithrombotics/antiplatelets: Secondary | ICD-10-CM

## 2014-02-26 DIAGNOSIS — R0602 Shortness of breath: Secondary | ICD-10-CM | POA: Diagnosis present

## 2014-02-26 DIAGNOSIS — Z87891 Personal history of nicotine dependence: Secondary | ICD-10-CM

## 2014-02-26 DIAGNOSIS — E785 Hyperlipidemia, unspecified: Secondary | ICD-10-CM | POA: Diagnosis present

## 2014-02-26 DIAGNOSIS — I252 Old myocardial infarction: Secondary | ICD-10-CM | POA: Diagnosis not present

## 2014-02-26 DIAGNOSIS — N320 Bladder-neck obstruction: Secondary | ICD-10-CM | POA: Diagnosis not present

## 2014-02-26 DIAGNOSIS — I428 Other cardiomyopathies: Secondary | ICD-10-CM

## 2014-02-26 DIAGNOSIS — I129 Hypertensive chronic kidney disease with stage 1 through stage 4 chronic kidney disease, or unspecified chronic kidney disease: Secondary | ICD-10-CM | POA: Diagnosis present

## 2014-02-26 DIAGNOSIS — I5020 Unspecified systolic (congestive) heart failure: Secondary | ICD-10-CM

## 2014-02-26 DIAGNOSIS — Z823 Family history of stroke: Secondary | ICD-10-CM

## 2014-02-26 DIAGNOSIS — I10 Essential (primary) hypertension: Secondary | ICD-10-CM | POA: Diagnosis present

## 2014-02-26 DIAGNOSIS — I2510 Atherosclerotic heart disease of native coronary artery without angina pectoris: Secondary | ICD-10-CM | POA: Diagnosis present

## 2014-02-26 DIAGNOSIS — I272 Other secondary pulmonary hypertension: Secondary | ICD-10-CM | POA: Diagnosis present

## 2014-02-26 DIAGNOSIS — Z950 Presence of cardiac pacemaker: Secondary | ICD-10-CM | POA: Diagnosis present

## 2014-02-26 DIAGNOSIS — G459 Transient cerebral ischemic attack, unspecified: Secondary | ICD-10-CM | POA: Diagnosis present

## 2014-02-26 DIAGNOSIS — I509 Heart failure, unspecified: Secondary | ICD-10-CM

## 2014-02-26 DIAGNOSIS — I447 Left bundle-branch block, unspecified: Secondary | ICD-10-CM | POA: Diagnosis present

## 2014-02-26 DIAGNOSIS — M1990 Unspecified osteoarthritis, unspecified site: Secondary | ICD-10-CM | POA: Diagnosis present

## 2014-02-26 DIAGNOSIS — Z954 Presence of other heart-valve replacement: Secondary | ICD-10-CM | POA: Diagnosis not present

## 2014-02-26 HISTORY — DX: Atherosclerotic heart disease of native coronary artery without angina pectoris: I25.10

## 2014-02-26 LAB — CBC
HCT: 41.1 % (ref 39.0–52.0)
Hemoglobin: 14 g/dL (ref 13.0–17.0)
MCH: 33.8 pg (ref 26.0–34.0)
MCHC: 34.1 g/dL (ref 30.0–36.0)
MCV: 99.3 fL (ref 78.0–100.0)
PLATELETS: 243 10*3/uL (ref 150–400)
RBC: 4.14 MIL/uL — ABNORMAL LOW (ref 4.22–5.81)
RDW: 14.6 % (ref 11.5–15.5)
WBC: 8.7 10*3/uL (ref 4.0–10.5)

## 2014-02-26 LAB — PROTIME-INR
INR: 3.44 — AB (ref 0.00–1.49)
Prothrombin Time: 34.9 seconds — ABNORMAL HIGH (ref 11.6–15.2)

## 2014-02-26 LAB — BASIC METABOLIC PANEL
ANION GAP: 15 (ref 5–15)
BUN: 24 mg/dL — ABNORMAL HIGH (ref 6–23)
CHLORIDE: 96 meq/L (ref 96–112)
CO2: 26 meq/L (ref 19–32)
CREATININE: 1.19 mg/dL (ref 0.50–1.35)
Calcium: 9.1 mg/dL (ref 8.4–10.5)
GFR calc Af Amer: 64 mL/min — ABNORMAL LOW (ref >90)
GFR calc non Af Amer: 55 mL/min — ABNORMAL LOW (ref >90)
Glucose, Bld: 104 mg/dL — ABNORMAL HIGH (ref 70–99)
POTASSIUM: 4.8 meq/L (ref 3.7–5.3)
SODIUM: 137 meq/L (ref 137–147)

## 2014-02-26 LAB — URINE CULTURE
Colony Count: NO GROWTH
Organism ID, Bacteria: NO GROWTH

## 2014-02-26 LAB — I-STAT TROPONIN, ED: Troponin i, poc: 0.02 ng/mL (ref 0.00–0.08)

## 2014-02-26 LAB — PRO B NATRIURETIC PEPTIDE: PRO B NATRI PEPTIDE: 18948 pg/mL — AB (ref 0–450)

## 2014-02-26 MED ORDER — ALBUTEROL SULFATE (2.5 MG/3ML) 0.083% IN NEBU: 2.5000 mg | INHALATION_SOLUTION | Freq: Four times a day (QID) | RESPIRATORY_TRACT | Status: DC | PRN

## 2014-02-26 MED ORDER — NITROGLYCERIN 0.4 MG SL SUBL: 0.4000 mg | SUBLINGUAL_TABLET | SUBLINGUAL | Status: DC | PRN

## 2014-02-26 MED ORDER — ACETAMINOPHEN 325 MG PO TABS
650.0000 mg | ORAL_TABLET | ORAL | Status: DC | PRN
  Administered 2014-02-26 – 2014-03-03 (×6): 650 mg via ORAL
  Filled 2014-02-26 (×6): qty 2

## 2014-02-26 MED ORDER — SODIUM CHLORIDE 0.9 % IJ SOLN
3.0000 mL | Freq: Two times a day (BID) | INTRAMUSCULAR | Status: DC
  Administered 2014-02-26 – 2014-03-02 (×5): 3 mL via INTRAVENOUS

## 2014-02-26 MED ORDER — METOPROLOL TARTRATE 25 MG PO TABS
25.0000 mg | ORAL_TABLET | Freq: Two times a day (BID) | ORAL | Status: DC
  Administered 2014-02-26 – 2014-02-27 (×3): 25 mg via ORAL
  Filled 2014-02-26 (×6): qty 1

## 2014-02-26 MED ORDER — ATORVASTATIN CALCIUM 40 MG PO TABS
40.0000 mg | ORAL_TABLET | Freq: Every evening | ORAL | Status: DC
Start: 2014-02-26 — End: 2014-03-03
  Administered 2014-02-26 – 2014-03-02 (×5): 40 mg via ORAL
  Filled 2014-02-26 (×8): qty 1

## 2014-02-26 MED ORDER — FUROSEMIDE 10 MG/ML IJ SOLN
40.0000 mg | Freq: Once | INTRAMUSCULAR | Status: AC
  Administered 2014-02-26: 40 mg via INTRAVENOUS
  Filled 2014-02-26: qty 4

## 2014-02-26 MED ORDER — FUROSEMIDE 10 MG/ML IJ SOLN
40.0000 mg | Freq: Two times a day (BID) | INTRAMUSCULAR | Status: DC
  Administered 2014-02-27 – 2014-02-28 (×4): 40 mg via INTRAVENOUS
  Filled 2014-02-26 (×7): qty 4

## 2014-02-26 MED ORDER — SODIUM CHLORIDE 0.9 % IJ SOLN: 3.0000 mL | INTRAMUSCULAR | Status: DC | PRN

## 2014-02-26 MED ORDER — ALBUTEROL SULFATE HFA 108 (90 BASE) MCG/ACT IN AERS: 2.0000 | INHALATION_SPRAY | Freq: Four times a day (QID) | RESPIRATORY_TRACT | Status: DC | PRN

## 2014-02-26 MED ORDER — DULOXETINE HCL 30 MG PO CPEP
30.0000 mg | ORAL_CAPSULE | Freq: Every day | ORAL | Status: DC
  Administered 2014-02-26 – 2014-03-02 (×5): 30 mg via ORAL
  Filled 2014-02-26 (×7): qty 1

## 2014-02-26 MED ORDER — ALLOPURINOL 100 MG PO TABS
200.0000 mg | ORAL_TABLET | Freq: Every day | ORAL | Status: DC
  Administered 2014-02-27 – 2014-03-03 (×5): 200 mg via ORAL
  Filled 2014-02-26 (×5): qty 2

## 2014-02-26 MED ORDER — ACETAMINOPHEN 500 MG PO TABS: 1000.0000 mg | ORAL_TABLET | Freq: Four times a day (QID) | ORAL | Status: DC | PRN

## 2014-02-26 MED ORDER — ONDANSETRON HCL 4 MG/2ML IJ SOLN: 4.0000 mg | Freq: Four times a day (QID) | INTRAMUSCULAR | Status: DC | PRN

## 2014-02-26 MED ORDER — PANTOPRAZOLE SODIUM 40 MG PO TBEC
40.0000 mg | DELAYED_RELEASE_TABLET | Freq: Every day | ORAL | Status: DC
  Administered 2014-02-27 – 2014-03-03 (×5): 40 mg via ORAL
  Filled 2014-02-26 (×6): qty 1

## 2014-02-26 MED ORDER — OMEGA-3-ACID ETHYL ESTERS 1 G PO CAPS
1.0000 g | ORAL_CAPSULE | Freq: Every day | ORAL | Status: DC
  Administered 2014-02-27 – 2014-03-03 (×5): 1 g via ORAL
  Filled 2014-02-26 (×5): qty 1

## 2014-02-26 MED ORDER — CLOPIDOGREL BISULFATE 75 MG PO TABS
75.0000 mg | ORAL_TABLET | Freq: Every day | ORAL | Status: DC
  Administered 2014-02-27 – 2014-03-03 (×5): 75 mg via ORAL
  Filled 2014-02-26 (×5): qty 1

## 2014-02-26 MED ORDER — ASPIRIN EC 81 MG PO TBEC
81.0000 mg | DELAYED_RELEASE_TABLET | Freq: Every day | ORAL | Status: DC
  Administered 2014-02-27 – 2014-02-28 (×2): 81 mg via ORAL
  Filled 2014-02-26 (×3): qty 1

## 2014-02-26 MED ORDER — SODIUM CHLORIDE 0.9 % IV SOLN: 250.0000 mL | INTRAVENOUS | Status: DC | PRN

## 2014-02-26 NOTE — Progress Notes (Signed)
ANTICOAGULATION CONSULT NOTE - Initial Consult  Pharmacy Consult for coumadin Indication: atrial fibrillation  Allergies  Allergen Reactions  . Ambien [Zolpidem] Other (See Comments)    Hallucinations  . Ativan [Lorazepam] Other (See Comments)    hallucinations  . Penicillins     Unknown childhood reaction   . Sulfa Antibiotics     unknown   Vital Signs: Temp: 95.7 F (35.4 C) (12/02 1543) Temp Source: Axillary (12/02 1543) BP: 129/45 mmHg (12/02 1630) Pulse Rate: 71 (12/02 1900)  Labs:  Recent Labs  02/24/14 1606 02/26/14 1623  HGB 13.1 14.0  HCT 40.1 41.1  PLT 237.0 243  LABPROT  --  34.9*  INR  --  3.44*  CREATININE 1.3 1.19    Estimated Creatinine Clearance: 51.9 mL/min (by C-G formula based on Cr of 1.19).   Medical History: Past Medical History  Diagnosis Date  . Arthritis   . Non-obstructive CAD     a. 12/2013 NSTEMI/Cath: LM nl, LAD 20, LCX 40-50, RCA 20.  Marland Kitchen Hypertension   . Stroke     a. July 2000;  b. January 2007;  c. TIA in 2014.  . CKD (chronic kidney disease), stage III   . GERD (gastroesophageal reflux disease)   . Foot pain, bilateral   . H/O hematuria   . Hyperlipidemia   . Vitamin D deficiency   . Chronic combined systolic and diastolic CHF (congestive heart failure)     a. 12/2013 Echo: EF 30-35%, mod conc LVH, Gr 3 DD, mild AI, mod-sev MR, mod dil LA, mild TR.  Marland Kitchen PAF (paroxysmal atrial fibrillation)     a. CHA2DS2VASc = 7--> Chronic Coumadin.  Marland Kitchen NICM (nonischemic cardiomyopathy)     a. 12/2013 Echo: EF 30-35%.  . Aortic valve prosthesis present     a. 2006: S/P bioprosthetic AVR.  Marland Kitchen PAD (peripheral artery disease)     a. s/p LLE stenting.  . Carotid arterial disease     a. s/p R CEA  . History of tobacco abuse   . Moderate to Severe Mitral Regurgitation     a. 12/2013 Echo: Mod-Sev MR.  Marland Kitchen Anxiety   . COPD (chronic obstructive pulmonary disease)   . Coronary arteriosclerosis Oct 2015    med Rx    Medications:  See med  rec  Assessment: Patient is an 80 y.o M s/p recent pacemaker (02/17/14) on coumadin PTA for afib and recurrent CVAs. Home coumadin regimen is 4mg  daily except 1mg  on Sundays. He presented today to the ED with c/o SOB.   Outpatient Cardiology note indicated that he reported nosebleed on the evening of 11/30 and early morning of 12/01 with an INR of 7.1 at his PCP office.  He started that he was instructed to hold his dose on 12/01 and 12/02 and to come back for repeat INR check on 12/3.  INR is 3.44 today.  Goal of Therapy:  INR 2-3 Monitor platelets by anticoagulation protocol: Yes   Plan:  1) will continue to hold his dose today 2) f/u with AM labs  Jayah Balthazar P 02/26/2014,7:24 PM

## 2014-02-26 NOTE — Telephone Encounter (Signed)
New Message  Pt wife called states that the pt is experiencing SOB requets same day appt or is it ok to Go to the ER

## 2014-02-26 NOTE — ED Notes (Signed)
Presents with SOB began o in October and has been intermittent. Recently had pacemaker placed on week ago, over the past 2 days SOB is much worse. Breath sounds are diminished.  sats 100%

## 2014-02-26 NOTE — ED Notes (Signed)
Bladder scan 551 cc

## 2014-02-26 NOTE — ED Notes (Signed)
Internal Medicine at the bedside. 

## 2014-02-26 NOTE — Telephone Encounter (Signed)
I spoke with the patient's wife. Per her report, the patient saw Norma Fredrickson, NP on 11/30. He was coughing and had some fluid on board. He was started on a z-pack and his lasix was increased to 80 mg BID. He has been urinating more and his cough is improving. He ate this morning but he has not been doing any activity today. He had a nosebleed Monday night/ early Tuesday morning. His INR was 7.1 yesterday at his PCP office. He typically will take 4 mg of warfarin daily except for 1 mg on sundays. He was told to hold warfarin last night and repeat his INR tomorrow morning. He is weighing at home and his weight yesterday was 166 lbs and 165 lbs today. His BP today is 134/71 HR- 80 O2 sats- 93%. The patient had a repeat chest x-ray on 11/30 that still showed an effusion. I reviewed with Norma Fredrickson, NP- since effusion was still present on x-ray on 11/30, she is concerned this may be growing in size. She recommends that the patient be evaluated in the ER. I have notified the patient's wife and she is agreeable. She will bring him to [facility]. Press photographer at [facility] ER notified.

## 2014-02-26 NOTE — ED Provider Notes (Signed)
CSN: [id number]     Arrival date & time 02/26/14  1531 History   First MD Initiated Contact with Patient 02/26/14 1555     Chief Complaint  Patient presents with  . Shortness of Breath     (Consider location/radiation/quality/duration/timing/severity/associated sxs/prior Treatment) HPI Comments: Patient with history of nonobstructive CAD, nonischemic cardiomyopathy presenting with shortness of breath that has been worsening for the past 6 weeks.issues include non obstructive CAD, HTN, prior CVA, CKD, HLD, PAF and prior AVR.  He denies any chest pain, fever. His cough is productive of white mucus. He was seen in the cardiology office 3 days ago and his Lasix was increased to 80 mg twice a day. He states this did not improve his breathing. He is unable to lay flat at baseline. This is unchanged. He becomes more dyspneic with exertion. He also has noticed an increase in his leg swelling. He called his cardiologist and was told to come to the ER as his x-ray several days ago showed a pleural effusion his Coumadin is on hold because his INR was elevated greater than 7. He's had intermittent nosebleeds for the past 2 days. He had a pacemaker placed October 23.  The history is provided by the spouse and the patient.    Past Medical History  Diagnosis Date  . Arthritis   . Non-obstructive CAD     a. 12/2013 NSTEMI/Cath: LM nl, LAD 20, LCX 40-50, RCA 20.  Marland Kitchen Hypertension   . Stroke     a. July 2000;  b. January 2007;  c. TIA in 2014.  . CKD (chronic kidney disease), stage III   . GERD (gastroesophageal reflux disease)   . Foot pain, bilateral   . H/O hematuria   . Hyperlipidemia   . Vitamin D deficiency   . Chronic combined systolic and diastolic CHF (congestive heart failure)     a. 12/2013 Echo: EF 30-35%, mod conc LVH, Gr 3 DD, mild AI, mod-sev MR, mod dil LA, mild TR.  Marland Kitchen PAF (paroxysmal atrial fibrillation)     a. CHA2DS2VASc = 7--> Chronic Coumadin.  Marland Kitchen NICM (nonischemic cardiomyopathy)      a. 12/2013 Echo: EF 30-35%.  . Aortic valve prosthesis present     a. 2006: S/P bioprosthetic AVR.  Marland Kitchen PAD (peripheral artery disease)     a. s/p LLE stenting.  . Carotid arterial disease     a. s/p R CEA  . History of tobacco abuse   . Moderate to Severe Mitral Regurgitation     a. 12/2013 Echo: Mod-Sev MR.  Marland Kitchen Anxiety   . COPD (chronic obstructive pulmonary disease)   . Coronary arteriosclerosis Oct 2015    med Rx   Past Surgical History  Procedure Laterality Date  . Aortic valve replacement (avr)/coronary artery bypass grafting (cabg)  12/2008  . Eye surgery    . Carotid angiogram  1996  . Left lower extremity stent    . Carotid endarterectomy    . Insert / replace / remove pacemaker    . Coronary angiogram  Oct 2015  . Pacemaker insertion  Nov 2015    MDT BiV   Family History  Problem Relation Age of Onset  . Stroke Mother   . Cancer Father    History  Substance Use Topics  . Smoking status: Former Smoker    Quit date: 09/26/1998  . Smokeless tobacco: Never Used  . Alcohol Use: 0.6 oz/week    1 Cans of beer per week  Comment: drinks one can of beer daily.    Review of Systems  Constitutional: Negative for fever, activity change and appetite change.  HENT: Positive for congestion and nosebleeds. Negative for rhinorrhea.   Respiratory: Positive for cough and shortness of breath.   Cardiovascular: Positive for leg swelling.  Gastrointestinal: Negative for nausea, vomiting and abdominal pain.  Genitourinary: Negative for dysuria and hematuria.  Musculoskeletal: Negative for myalgias, back pain and arthralgias.  Skin: Negative for rash.  Neurological: Negative for dizziness.  A complete 10 system review of systems was obtained and all systems are negative except as noted in the HPI and PMH.      Allergies  Ambien; Ativan; Penicillins; and Sulfa antibiotics  Home Medications   Prior to Admission medications   Medication Sig Start Date End Date Taking?  Authorizing Provider  acetaminophen (TYLENOL) 500 MG tablet Take 1,000 mg by mouth every 6 (six) hours as needed for mild pain or headache.    Yes Historical Provider, MD  albuterol (PROVENTIL HFA;VENTOLIN HFA) 108 (90 BASE) MCG/ACT inhaler Inhale 2 puffs into the lungs every 6 (six) hours as needed for wheezing or shortness of breath.   Yes Historical Provider, MD  alendronate (FOSAMAX) 70 MG tablet Take 70 mg by mouth once a week. sunday 02/28/13  Yes Historical Provider, MD  allopurinol (ZYLOPRIM) 100 MG tablet Take 200 mg by mouth daily.   Yes Historical Provider, MD  atorvastatin (LIPITOR) 40 MG tablet Take 40 mg by mouth every evening.   Yes Historical Provider, MD  azithromycin (ZITHROMAX Z-PAK) 250 MG tablet Take 2 pills today, then one daily until completed. 02/24/14  Yes Rosalio MacadamiaLori C Gerhardt, NP  Calcium Carb-Cholecalciferol (CALCIUM 600 + D PO) Take 2 tablets by mouth every morning.   Yes Historical Provider, MD  clopidogrel (PLAVIX) 75 MG tablet Take 75 mg by mouth daily.   Yes Historical Provider, MD  DULoxetine (CYMBALTA) 30 MG capsule Take 30 mg by mouth daily.   Yes Historical Provider, MD  furosemide (LASIX) 80 MG tablet Take 1 tablet (80 mg total) by mouth 2 (two) times daily. Take 80 mg BID for 3 days and then cut back to 40 mg BID 02/24/14  Yes Rosalio MacadamiaLori C Gerhardt, NP  metoprolol tartrate (LOPRESSOR) 25 MG tablet Take 1 tablet (25 mg total) by mouth 2 (two) times daily. 01/16/14  Yes Osvaldo ShipperGokul Krishnan, MD  Omega-3 Fatty Acids (FISH OIL) 1200 MG CAPS Take 1 capsule by mouth every morning.   Yes Historical Provider, MD  omeprazole (PRILOSEC) 40 MG capsule Take 40 mg by mouth every morning.   Yes Historical Provider, MD  warfarin (COUMADIN) 1 MG tablet Take 1 mg by mouth once a week. On Sunday   Yes Historical Provider, MD  warfarin (COUMADIN) 4 MG tablet Take 4 mg by mouth See admin instructions. Monday-Saturday 02/24/13  Yes Historical Provider, MD   BP 130/46 mmHg  Pulse 72  Temp(Src) 98.9  F (37.2 C) (Oral)  Resp 28  Ht 5\' 11"  (1.803 m)  Wt 161 lb 2.5 oz (73.1 kg)  BMI 22.49 kg/m2  SpO2 92% Physical Exam  Constitutional: He is oriented to person, place, and time. He appears well-developed and well-nourished. No distress.  HENT:  Head: Normocephalic and atraumatic.  Mouth/Throat: Oropharynx is clear and moist. No oropharyngeal exudate.  Eyes: Conjunctivae and EOM are normal. Pupils are equal, round, and reactive to light.  Neck: Normal range of motion. Neck supple.  No meningismus.  Cardiovascular: Normal rate, regular rhythm,  normal heart sounds and intact distal pulses.   No murmur heard. Pulmonary/Chest: Effort normal and breath sounds normal. No respiratory distress.  Scatter rales at bases  Abdominal: Soft. There is no tenderness. There is no rebound and no guarding.  Musculoskeletal: Normal range of motion. He exhibits edema. He exhibits no tenderness.  +1 pretibial edema.  Sock lines seen  Neurological: He is alert and oriented to person, place, and time. No cranial nerve deficit. He exhibits normal muscle tone. Coordination normal.  No ataxia on finger to nose bilaterally. No pronator drift. 5/5 strength throughout. CN 2-12 intact. Negative Romberg. Equal grip strength. Sensation intact. Gait is normal.   Skin: Skin is warm.  Psychiatric: He has a normal mood and affect. His behavior is normal.  Nursing note and vitals reviewed.   ED Course  Procedures (including critical care time) Labs Review Labs Reviewed  BASIC METABOLIC PANEL - Abnormal; Notable for the following:    Glucose, Bld 104 (*)    BUN 24 (*)    GFR calc non Af Amer 55 (*)    GFR calc Af Amer 64 (*)    All other components within normal limits  PRO B NATRIURETIC PEPTIDE - Abnormal; Notable for the following:    Pro B Natriuretic peptide (BNP) 18948.0 (*)    All other components within normal limits  CBC - Abnormal; Notable for the following:    RBC 4.14 (*)    All other components  within normal limits  PROTIME-INR - Abnormal; Notable for the following:    Prothrombin Time 34.9 (*)    INR 3.44 (*)    All other components within normal limits  BASIC METABOLIC PANEL  CBC  PROTIME-INR  TSH  I-STAT TROPOININ, ED    Imaging Review Dg Chest 2 View  02/26/2014   CLINICAL DATA:  Shortness of breath for 2 months.  EXAM: CHEST  2 VIEW  COMPARISON:  02/24/2014  FINDINGS: The pacer wires are stable. The cardiac silhouette, mediastinal and hilar contours are unchanged. There are persistent bilateral pleural effusions and bibasilar atelectasis. Persistent loculated fluid in the right major fissure. No pneumothorax.  IMPRESSION: Persistent effusions and atelectasis.   Electronically Signed   By: Loralie Champagne M.D.   On: 02/26/2014 17:06     EKG Interpretation   Date/Time:  Wednesday February 26 2014 15:33:57 EST Ventricular Rate:  79 PR Interval:  118 QRS Duration: 158 QT Interval:  446 QTC Calculation: 511 R Axis:   -101 Text Interpretation:  Atrial-sensed ventricular-paced rhythm Abnormal ECG  No significant change was found Confirmed by Manus Gunning  MD, Hugh Kamara [phone])  on 02/26/2014 4:15:47 PM      MDM   Final diagnoses:  Systolic congestive heart failure, unspecified congestive heart failure chronicity   Worsening shortness of breath over the past 6 weeks with recent increase in diuretics. Chest x-ray with pleural effusion.  EKG Paced.  IV lasix given. INR 3.4. Doubt PE. BNP 19000.  D/w cardiology who will admit for diuresis.    Glynn Octave, MD 02/27/14 [phone]

## 2014-02-26 NOTE — H&P (Signed)
Patient ID: Timothy Durham MRN: [id number], DOB/AGE: 78-Jan-1932   Admit date: 02/26/2014   Primary Physician: Dennis Bast, MD Primary Cardiologist: Dr Ladona Ridgel  HPI:   80 y.o. male with a past medical history significant for prior CVA in 2000 felt to be secondary to AF. He has been on chronic anticoagulation. He has had subsequent TIAs in 2007 and in Dec 2014, Plavix was added. He is s/p tissue AVR in 2006 in [place]. He had been followed by a cardiologist in [facility]. We saw the pt in Oct when he presented with acute CHF in setting of rapid AF. He was diuresed and he converted after Diltiazem was ordered. Echo 01/12/14 showed a drop in his AF to 30-35% (previous echo in Dec 2014- 40-45%). He underwent Rt and Lt heart cath 01/14/14 and this showed moderate non obstructive CAD with a 50% CFX and mild pulmonary HTN. He was discharged 01/16/14. His diuretics were decreased as an OP secondary to worsening renal function. He came back to the ER 02/09/14 with SOB. His BNP was 17k.  The pt has a LBBB. Dr Ladona Ridgel saw the pt and it was decided to proceed with a BiV implant. The pt was discharged and then re admitted 02/17/14 for a MDT BiV pacemaker. Since then he has continued to have dyspnea. He has been seen in the ER as well as the office. He has had no relief with adjustment in his medications. Today he came back to the ER after calling the office complaining of orthopnea. In the ER he has bilateral effusions and a BNP of 19k.    Problem List: Past Medical History  Diagnosis Date  . Arthritis   . Non-obstructive CAD     a. 12/2013 NSTEMI/Cath: LM nl, LAD 20, LCX 40-50, RCA 20.  Marland Kitchen Hypertension   . Stroke     a. July 2000;  b. January 2007;  c. TIA in 2014.  . CKD (chronic kidney disease), stage III   . GERD (gastroesophageal reflux disease)   . Foot pain, bilateral   . H/O hematuria   . Hyperlipidemia   . Vitamin D deficiency   . Chronic combined systolic and diastolic CHF (congestive heart failure)      a. 12/2013 Echo: EF 30-35%, mod conc LVH, Gr 3 DD, mild AI, mod-sev MR, mod dil LA, mild TR.  Marland Kitchen PAF (paroxysmal atrial fibrillation)     a. CHA2DS2VASc = 7--> Chronic Coumadin.  Marland Kitchen NICM (nonischemic cardiomyopathy)     a. 12/2013 Echo: EF 30-35%.  . Aortic valve prosthesis present     a. 2006: S/P bioprosthetic AVR.  Marland Kitchen PAD (peripheral artery disease)     a. s/p LLE stenting.  . Carotid arterial disease     a. s/p R CEA  . History of tobacco abuse   . Moderate to Severe Mitral Regurgitation     a. 12/2013 Echo: Mod-Sev MR.  Marland Kitchen Anxiety   . COPD (chronic obstructive pulmonary disease)   . Coronary arteriosclerosis Oct 2015    med Rx    Past Surgical History  Procedure Laterality Date  . Aortic valve replacement (avr)/coronary artery bypass grafting (cabg)  12/2008  . Eye surgery    . Carotid angiogram  1996  . Left lower extremity stent    . Carotid endarterectomy    . Insert / replace / remove pacemaker    . Coronary angiogram  Oct 2015  . Pacemaker insertion  Nov 2015    MDT BiV  Allergies:  Allergies  Allergen Reactions  . Ambien [Zolpidem] Other (See Comments)    Hallucinations  . Ativan [Lorazepam] Other (See Comments)    hallucinations  . Penicillins     Unknown childhood reaction   . Sulfa Antibiotics     unknown     Home Medications Current Facility-Administered Medications  Medication Dose Route Frequency Provider Last Rate Last Dose  . furosemide (LASIX) injection 40 mg  40 mg Intravenous Once Glynn Octave, MD       Current Outpatient Prescriptions  Medication Sig Dispense Refill  . acetaminophen (TYLENOL) 500 MG tablet Take 1,000 mg by mouth every 6 (six) hours as needed for mild pain or headache.     . albuterol (PROVENTIL HFA;VENTOLIN HFA) 108 (90 BASE) MCG/ACT inhaler Inhale 2 puffs into the lungs every 6 (six) hours as needed for wheezing or shortness of breath.    Marland Kitchen alendronate (FOSAMAX) 70 MG tablet Take 70 mg by mouth once a week. sunday     . allopurinol (ZYLOPRIM) 100 MG tablet Take 200 mg by mouth daily.    Marland Kitchen atorvastatin (LIPITOR) 40 MG tablet Take 40 mg by mouth every evening.    Marland Kitchen azithromycin (ZITHROMAX Z-PAK) 250 MG tablet Take 2 pills today, then one daily until completed. 6 each 0  . Calcium Carb-Cholecalciferol (CALCIUM 600 + D PO) Take 2 tablets by mouth every morning.    . clopidogrel (PLAVIX) 75 MG tablet Take 75 mg by mouth daily.    . DULoxetine (CYMBALTA) 30 MG capsule Take 30 mg by mouth daily.    . furosemide (LASIX) 80 MG tablet Take 1 tablet (80 mg total) by mouth 2 (two) times daily. Take 80 mg BID for 3 days and then cut back to 40 mg BID 60 tablet 5  . metoprolol tartrate (LOPRESSOR) 25 MG tablet Take 1 tablet (25 mg total) by mouth 2 (two) times daily. 60 tablet 3  . Omega-3 Fatty Acids (FISH OIL) 1200 MG CAPS Take 1 capsule by mouth every morning.    Marland Kitchen omeprazole (PRILOSEC) 40 MG capsule Take 40 mg by mouth every morning.    . warfarin (COUMADIN) 1 MG tablet Take 1 mg by mouth once a week. On Sunday    . warfarin (COUMADIN) 4 MG tablet Take 4 mg by mouth See admin instructions. Monday-Saturday       Family History  Problem Relation Age of Onset  . Stroke Mother   . Cancer Father      History   Social History  . Marital Status: Married    Spouse Name: N/A    Number of Children: N/A  . Years of Education: N/A   Occupational History  . Not on file.   Social History Main Topics  . Smoking status: Former Smoker    Quit date: 09/26/1998  . Smokeless tobacco: Never Used  . Alcohol Use: 0.6 oz/week    1 Cans of beer per week     Comment: drinks one can of beer daily.  . Drug Use: No  . Sexual Activity: Not on file   Other Topics Concern  . Not on file   Social History Narrative     Review of Systems: General: negative for chills, fever, night sweats or weight changes.  Cardiovascular: negative for chest pain, palpitations, paroxysmal nocturnal dyspnea or shortness of  breath Dermatological: negative for rash Respiratory: negative for cough or wheezing Urologic: negative for hematuria Abdominal: negative for nausea, vomiting, diarrhea, bright red blood per  rectum, melena, or hematemesis Neurologic: negative for visual changes, syncope, or dizziness All other systems reviewed and are otherwise negative except as noted above.  Physical Exam: Blood pressure 129/45, pulse 62, temperature 95.7 F (35.4 C), temperature source Axillary, resp. rate 29, SpO2 98 %.  General appearance: alert, cooperative and no distress Neck: no carotid bruit and JVP noted Lungs: bi basilar crackles Heart: regular rate and rhythm Abdomen: soft, non-tender; bowel sounds normal; no masses,  no organomegaly Extremities: 2+ pitting edema Pulses: diminnished Skin: pale cool dry Neurologic: Grossly normal    Labs:   Results for orders placed or performed during the hospital encounter of 02/26/14 (from the past 24 hour(s))  Basic metabolic panel     Status: Abnormal   Collection Time: 02/26/14  4:23 PM  Result Value Ref Range   Sodium 137 137 - 147 mEq/L   Potassium 4.8 3.7 - 5.3 mEq/L   Chloride 96 96 - 112 mEq/L   CO2 26 19 - 32 mEq/L   Glucose, Bld 104 (H) 70 - 99 mg/dL   BUN 24 (H) 6 - 23 mg/dL   Creatinine, Ser 1.61 0.50 - 1.35 mg/dL   Calcium 9.1 8.4 - 09.6 mg/dL   GFR calc non Af Amer 55 (L) >90 mL/min   GFR calc Af Amer 64 (L) >90 mL/min   Anion gap 15 5 - 15  BNP (order ONLY if patient complains of dyspnea/SOB AND you have documented it for THIS visit)     Status: Abnormal   Collection Time: 02/26/14  4:23 PM  Result Value Ref Range   Pro B Natriuretic peptide (BNP) 18948.0 (H) 0 - 450 pg/mL  CBC     Status: Abnormal   Collection Time: 02/26/14  4:23 PM  Result Value Ref Range   WBC 8.7 4.0 - 10.5 K/uL   RBC 4.14 (L) 4.22 - 5.81 MIL/uL   Hemoglobin 14.0 13.0 - 17.0 g/dL   HCT 04.5 40.9 - 81.1 %   MCV 99.3 78.0 - 100.0 fL   MCH 33.8 26.0 - 34.0 pg   MCHC  34.1 30.0 - 36.0 g/dL   RDW 91.4 78.2 - 95.6 %   Platelets 243 150 - 400 K/uL  Protime-INR     Status: Abnormal   Collection Time: 02/26/14  4:23 PM  Result Value Ref Range   Prothrombin Time 34.9 (H) 11.6 - 15.2 seconds   INR 3.44 (H) 0.00 - 1.49  I-stat troponin, ED (not at [facility])     Status: None   Collection Time: 02/26/14  4:39 PM  Result Value Ref Range   Troponin i, poc 0.02 0.00 - 0.08 ng/mL   Comment 3             Radiology/Studies: Dg Chest 2 View  02/26/2014   CLINICAL DATA:  Shortness of breath for 2 months.  EXAM: CHEST  2 VIEW  COMPARISON:  02/24/2014  FINDINGS: The pacer wires are stable. The cardiac silhouette, mediastinal and hilar contours are unchanged. There are persistent bilateral pleural effusions and bibasilar atelectasis. Persistent loculated fluid in the right major fissure. No pneumothorax.  IMPRESSION: Persistent effusions and atelectasis.   Electronically Signed   By: Loralie Champagne M.D.   On: 02/26/2014 17:06   Dg Chest 2 View  02/24/2014   CLINICAL DATA:  Recent pacemaker insertion ; continued cough and weakness and shortness of breath; history of pleural effusions  EXAM: CHEST  2 VIEW  COMPARISON:  PA and lateral chest of  February 21, 2014.  FINDINGS: The lungs are adequately inflated. There are bilateral pleural effusions unchanged from the previous study. Fluid remains in the minor fissure. There is no pneumothorax. Right basilar atelectasis or infiltrate persists peer The heart and pulmonary vascularity are normal. There are 7 intact sternal wires. The permanent pacemaker leads are in reasonable position radiographically. The bony thorax is unremarkable.  IMPRESSION: The small to moderate-sized bilateral pleural effusions are stable. No significant interval change in the appearance of the chest since study of 3 days ago.   Electronically Signed   By: David  SwazilandJordan   On: 02/24/2014 16:54   Dg Chest 2 View  02/21/2014   CLINICAL DATA:  Shortness of Breath   EXAM: CHEST  2 VIEW  COMPARISON:  02/18/2014  FINDINGS: Cardiomediastinal silhouette is stable. Three leads cardiac pacemaker is unchanged in position. Bilateral small pleural effusion right greater than left. There is right basilar atelectasis or infiltrate. Persistent loculated pleural fluid in right minor fissure. Central mild vascular congestion without convincing pulmonary edema.  IMPRESSION: Bilateral small pleural effusion right greater than left. Right basilar atelectasis or infiltrate. Persistent loculated fluid in right minor fissure. No convincing pulmonary edema. Three leads cardiac pacemaker in place.   Electronically Signed   By: Natasha MeadLiviu  Pop M.D.   On: 02/21/2014 22:20   Dg Chest 2 View  02/18/2014   CLINICAL DATA:  80 year old male status post placement of cardiac rhythm maintenance device  EXAM: CHEST  2 VIEW  COMPARISON:  Preoperative chest x-ray 02/09/2014  FINDINGS: Interval placement of a left subclavian approach biventricular cardiac rhythm maintenance device. Leads project over the right atrium, right ventricle and within a cardiac vein overlying the left ventricle. Patient is status post median sternotomy with evidence of prior aortic valve replacement. Cardiac and mediastinal contours remain unchanged. Atherosclerotic calcification is present within the transverse aorta. No evidence of a pneumothorax or enlarging hemothorax. There are persistent right larger than left pleural effusions with fluid trapped in the minor fissure on the right. Interstitial pulmonary edema has slightly improved compared to the prior. Persistent but improved bibasilar atelectasis. Inspiratory volumes overall are slightly improved. No acute osseous abnormality.  IMPRESSION: 1. Slightly decreased pulmonary interstitial edema following placement of a biventricular cardiac rhythm maintenance device. 2. No evidence of pneumothorax or other complicating feature. 3. Persistent right larger than left pleural effusions  with pleural fluid loculated in the minor fissure. 4. Persistent but improving bibasilar atelectasis.   Electronically Signed   By: Malachy MoanHeath  McCullough M.D.   On: 02/18/2014 07:49   Dg Chest 2 View  02/09/2014   CLINICAL DATA:  Shortness of breath  EXAM: CHEST  2 VIEW  COMPARISON:  01/12/2014  FINDINGS: Postsurgical changes are again seen. The cardiac shadow is stable. Small bilateral pleural effusions are noted as well as fluid trapped within the minor fissure on the right. Mild vascular congestion with interstitial edema is noted. The overall appearance of the congestion is improved when compare with the prior exam however.  IMPRESSION: CHF with small bilateral pleural effusions and fluid trapped within the minor fissure on the right.   Electronically Signed   By: Alcide CleverMark  Lukens M.D.   On: 02/09/2014 17:48    EKG: Paced  ASSESSMENT AND PLAN:   Acute on chronic systolic CHF   The patient and his wife are very careful about his salt and fluid intake. Despite this, he rapidly gains fluid after going home from the hospital. He was 159 pounds on his  home scale when he went home most recently. He is up to 166 pounds on his home scale today. Clinically he is volume overloaded. This is occurring despite his home dose of Lasix. Up to this point higher doses have not been used because of renal insufficiency. His diuretic dosing may have to be increased allowing his BUN and creatinine to rise somewhat. We must look for other reasons for his continued recurrent fluid accumulation. It is possible that he is in atrial fib underlying his bi-V pacing function. If this is the case, I suspect we will have to push hard to try to get him back in the sinus. I have spoken with Dr. Sharrell Ku about him tonight. Dr. Ladona Ridgel will be seeing the patient tomorrow.     The patient tells me that his urine "dribbles out." I cannot tell if he is describing true bladder outlet obstructive symptoms or not. We will obtain a bladder scan.  Very careful consideration should be given to be sure that bladder outlet obstruction is not playing a role with his fluid retention.     I cannot find a recent TSH. This will be checked to be complete.    TIA 2007, Dec 2014 (Plavix added)   Hypertension   Coronary artery disease-moderate Oct 2015    CKD (chronic kidney disease), stage III     We may have to push his BUN and creatinine up in order to keep him stable.    Moderate to Severe Mitral Regurgitation   PAD- LLE PTA, RCEA, attempted LICA stent   Tissue AVR 2006 (in [place])   NICM (nonischemic cardiomyopathy)    PAF    His pacemaker will be interrogated tomorrow. We will know quickly if he has atrial fib. If so I'm sure that Dr. Ladona Ridgel and the team will push hard to get him back to sinus rhythm.    Hyperlipidemia   LBBB (left bundle branch block)   History of CVA- Rt brain 2000    Chronic anticoagulation      His INR was 7 very recently and his Coumadin is on hold. He INR today is down to 3.5. We will last the pharmacy to help [place].    Pacemaker- biventricular      Weight loss    The patient is having true body weight loss even though he has recurrent increase in his weight from volume overload. I think this may be related to poor appetite with his current situation. I do not know if there is another basis for weight loss.    Rule out bladder outlet obstruction      I mention above that we need to be sure that he has no component of bladder outlet obstruction.  The patient and his wife are very frustrated with the fact that he continues to have rapid return visits to the hospital. During this hospitalization, I think it will be prudent to treat him until he is completely stable and then continue to watch him in the hospital setting to be sure that we're using optimal doses of medicines etc.    PLAN: Admit, diurese. EP to evaluate in am. (Dr. Ladona Ridgel is aware)   Signed, Abelino Derrick, PA-C 02/26/2014, 6:33 PM  Patient seen  and examined. I agree with the assessment and plan as detailed above. See also my additional thoughts below.   The beginning of the note above was prepared by Corine Shelter, PA-C. The entire assessment and plan is written by me.  Tinnie Gens  Myrtis Ser, MD, [facility] 02/26/2014 7:11 PM

## 2014-02-26 NOTE — ED Notes (Signed)
Attempted Report x1.   

## 2014-02-27 ENCOUNTER — Ambulatory Visit: Payer: Medicare HMO

## 2014-02-27 ENCOUNTER — Inpatient Hospital Stay (HOSPITAL_COMMUNITY): Payer: Medicare HMO

## 2014-02-27 ENCOUNTER — Encounter (HOSPITAL_COMMUNITY): Payer: Self-pay

## 2014-02-27 DIAGNOSIS — E43 Unspecified severe protein-calorie malnutrition: Secondary | ICD-10-CM | POA: Insufficient documentation

## 2014-02-27 DIAGNOSIS — I5043 Acute on chronic combined systolic (congestive) and diastolic (congestive) heart failure: Secondary | ICD-10-CM

## 2014-02-27 LAB — BASIC METABOLIC PANEL
Anion gap: 14 (ref 5–15)
BUN: 23 mg/dL (ref 6–23)
CO2: 27 mEq/L (ref 19–32)
Calcium: 8.6 mg/dL (ref 8.4–10.5)
Chloride: 97 mEq/L (ref 96–112)
Creatinine, Ser: 1.17 mg/dL (ref 0.50–1.35)
GFR calc Af Amer: 66 mL/min — ABNORMAL LOW (ref >90)
GFR calc non Af Amer: 57 mL/min — ABNORMAL LOW (ref >90)
Glucose, Bld: 87 mg/dL (ref 70–99)
Potassium: 3.7 mEq/L (ref 3.7–5.3)
Sodium: 138 mEq/L (ref 137–147)

## 2014-02-27 LAB — PROTIME-INR
INR: 3.41 — ABNORMAL HIGH (ref 0.00–1.49)
Prothrombin Time: 34.7 seconds — ABNORMAL HIGH (ref 11.6–15.2)

## 2014-02-27 LAB — CBC
HCT: 34.9 % — ABNORMAL LOW (ref 39.0–52.0)
Hemoglobin: 11.8 g/dL — ABNORMAL LOW (ref 13.0–17.0)
MCH: 32.7 pg (ref 26.0–34.0)
MCHC: 33.8 g/dL (ref 30.0–36.0)
MCV: 96.7 fL (ref 78.0–100.0)
Platelets: 210 10*3/uL (ref 150–400)
RBC: 3.61 MIL/uL — ABNORMAL LOW (ref 4.22–5.81)
RDW: 14.4 % (ref 11.5–15.5)
WBC: 6.2 10*3/uL (ref 4.0–10.5)

## 2014-02-27 LAB — TSH: TSH: 3.03 u[IU]/mL (ref 0.350–4.500)

## 2014-02-27 MED ORDER — ENSURE COMPLETE PO LIQD
237.0000 mL | Freq: Every day | ORAL | Status: DC
  Administered 2014-02-28 – 2014-03-03 (×4): 237 mL via ORAL

## 2014-02-27 MED ORDER — CETYLPYRIDINIUM CHLORIDE 0.05 % MT LIQD
7.0000 mL | Freq: Two times a day (BID) | OROMUCOSAL | Status: DC
  Administered 2014-02-28 – 2014-03-01 (×2): 7 mL via OROMUCOSAL

## 2014-02-27 MED ORDER — BOOST / RESOURCE BREEZE PO LIQD
1.0000 | Freq: Every day | ORAL | Status: DC
  Administered 2014-02-27 – 2014-03-01 (×3): 1 via ORAL

## 2014-02-27 NOTE — Progress Notes (Signed)
Peripherally Inserted Central Catheter/Midline Placement  The IV Nurse has discussed with the patient and/or persons authorized to consent for the patient, the purpose of this procedure and the potential benefits and risks involved with this procedure.  The benefits include less needle sticks, lab draws from the catheter and patient may be discharged home with the catheter.  Risks include, but not limited to, infection, bleeding, blood clot (thrombus formation), and puncture of an artery; nerve damage and irregular heat beat.  Alternatives to this procedure were also discussed.  PICC/Midline Placement Documentation  PICC / Midline Double Lumen 02/27/14 PICC Right Basilic 41 cm 0 cm (Active)  Indication for Insertion or Continuance of Line Prolonged intravenous therapies 02/27/2014  6:50 PM  Exposed Catheter (cm) 0 cm 02/27/2014  6:50 PM  Site Assessment Clean;Dry;Intact 02/27/2014  6:50 PM  Lumen #1 Status Flushed;Saline locked;Blood return noted 02/27/2014  6:50 PM  Lumen #2 Status Flushed;Saline locked;Blood return noted 02/27/2014  6:50 PM  Dressing Type Transparent 02/27/2014  6:50 PM  Dressing Status Clean;Dry;Intact;Antimicrobial disc in place 02/27/2014  6:50 PM  Line Care Connections checked and tightened 02/27/2014  6:50 PM  Line Adjustment (NICU/IV Team Only) No 02/27/2014  6:50 PM  Dressing Intervention New dressing 02/27/2014  6:50 PM  Dressing Change Due 03/06/14 02/27/2014  6:50 PM       Elliot Dally 02/27/2014, 6:51 PM

## 2014-02-27 NOTE — Progress Notes (Signed)
Patient Name: Timothy Durham      SUBJECTIVE:admitted with recurrent CHF  History of prior stroke and TIAs. He has a history of congestive heart failure setting of atrial ablation. Ejection fraction 10/15 was 30-35%. Catheterization at that time demonstrated nonobstructive disease.  LVEDP 20 and RA pressue about 11  CI 2.3  He was identified as having left bundle branch block (QRS 144>>177 with CRT)  following admission here [facility] and underwent CRT upgrade 11/15.  He is struggling with recurrent problems with dyspnea he was noted on admission to have effusions.  Past Medical History  Diagnosis Date  . Arthritis   . Non-obstructive CAD     a. 12/2013 NSTEMI/Cath: LM nl, LAD 20, LCX 40-50, RCA 20.  Marland Kitchen. Hypertension   . Stroke     a. July 2000;  b. January 2007;  c. TIA in 2014.  . CKD (chronic kidney disease), stage III   . GERD (gastroesophageal reflux disease)   . Foot pain, bilateral   . H/O hematuria   . Hyperlipidemia   . Vitamin D deficiency   . Chronic combined systolic and diastolic CHF (congestive heart failure)     a. 12/2013 Echo: EF 30-35%, mod conc LVH, Gr 3 DD, mild AI, mod-sev MR, mod dil LA, mild TR.  Marland Kitchen. PAF (paroxysmal atrial fibrillation)     a. CHA2DS2VASc = 7--> Chronic Coumadin.  Marland Kitchen. NICM (nonischemic cardiomyopathy)     a. 12/2013 Echo: EF 30-35%.  . Aortic valve prosthesis present     a. 2006: S/P bioprosthetic AVR.  Marland Kitchen. PAD (peripheral artery disease)     a. s/p LLE stenting.  . Carotid arterial disease     a. s/p R CEA  . History of tobacco abuse   . Moderate to Severe Mitral Regurgitation     a. 12/2013 Echo: Mod-Sev MR.  Marland Kitchen. Anxiety   . COPD (chronic obstructive pulmonary disease)   . Coronary arteriosclerosis Oct 2015    med Rx    Scheduled Meds:  Scheduled Meds: . allopurinol  200 mg Oral Daily  . aspirin EC  81 mg Oral Daily  . atorvastatin  40 mg Oral QPM  . clopidogrel  75 mg Oral Daily  . DULoxetine  30  mg Oral Daily  . furosemide  40 mg Intravenous BID  . metoprolol tartrate  25 mg Oral BID  . omega-3 acid ethyl esters  1 g Oral Daily  . pantoprazole  40 mg Oral Daily  . sodium chloride  3 mL Intravenous Q12H   Continuous Infusions:  sodium chloride, acetaminophen, albuterol, nitroGLYCERIN, ondansetron (ZOFRAN) IV, sodium chloride    PHYSICAL EXAM Filed Vitals:   02/26/14 2325 02/26/14 2352 02/27/14 0439 02/27/14 0700  BP: 133/69 130/46 129/44   Pulse: 74 72 63   Temp:  98.9 F (37.2 C) 97.6 F (36.4 C) 97.9 F (36.6 C)  TempSrc:  Oral Oral Oral  Resp: 25 28 19    Height:      Weight:   159 lb 9.8 oz (72.4 kg)   SpO2: 91% 92% 94%    Well developed and nourished in no acute distress HENT normal Neck supple with JVP-8 +HJR  Clear Regular rate and rhythm, no murmurs or gallops Abd-soft with active BS No Clubbing cyanosis ttr edema Skin-warm and dry A & Oriented  Grossly normal sensory and motor function  TELEMETRY: Reviewed telemetry pt in NSR with P-synchronous/ AV  pacing freq PACs  Intake/Output Summary (Last 24 hours) at 02/27/14 0831 Last data filed at 02/27/14 0759  Gross per 24 hour  Intake      0 ml  Output   1250 ml  Net  -1250 ml    LABS: Basic Metabolic Panel:  Recent Labs Lab 02/22/14 0100 02/24/14 1606 02/26/14 1623 02/27/14 0245  NA 138 135 137 138  K 4.3 4.0 4.8 3.7  CL 96 98 96 97  CO2 27 28 26 27   GLUCOSE 111* 78 104* 87  BUN 33* 27* 24* 23  CREATININE 1.20 1.3 1.19 1.17  CALCIUM 9.3 8.7 9.1 8.6   Cardiac Enzymes: No results for input(s): CKTOTAL, CKMB, CKMBINDEX, TROPONINI in the last 72 hours. CBC:  Recent Labs Lab 02/22/14 0100 02/24/14 1606 02/26/14 1623 02/27/14 0245  WBC 6.7 6.8 8.7 6.2  NEUTROABS 4.9 5.3  --   --   HGB 12.6* 13.1 14.0 11.8*  HCT 37.4* 40.1 41.1 34.9*  MCV 99.7 102.2* 99.3 96.7  PLT 218 237.0 243 210   PROTIME:  Recent Labs  02/26/14 1623 02/27/14 0245  LABPROT 34.9* 34.7*  INR 3.44*  3.41*   Liver Function Tests: No results for input(s): AST, ALT, ALKPHOS, BILITOT, PROT, ALBUMIN in the last 72 hours. No results for input(s): LIPASE, AMYLASE in the last 72 hours. BNP: BNP (last 3 results)  Recent Labs  02/22/14 0100 02/24/14 1606 02/26/14 1623  PROBNP 19705.0* 2973.0* 18948.0*   D-Dimer: No results for input(s): DDIMER in the last 72 hours. Hemoglobin A1C: No results for input(s): HGBA1C in the last 72 hours. Fasting Lipid Panel: No results for input(s): CHOL, HDL, LDLCALC, TRIG, CHOLHDL, LDLDIRECT in the last 72 hours. Thyroid Function Tests:  Recent Labs  02/27/14 0245  TSH 3.030   Anemia Panel: No results for input(s): VITAMINB12, FOLATE, FERRITIN, TIBC, IRON, RETICCTPCT in the last 72 hours.   Device Interrogation normal devioce function   ASSESSMENT AND PLAN:  Active Problems:   TIA 2007, Dec 2014 (Plavix added)   Hypertension   Coronary artery disease-moderate Oct 2015   CKD (chronic kidney disease), stage III   Moderate to Severe Mitral Regurgitation   PAD- LLE PTA, RCEA, attempted LICA stent   Tissue AVR 2006 (in [place])   NICM (nonischemic cardiomyopathy)   PAF   Hyperlipidemia   LBBB (left bundle branch block)   History of CVA- Rt brain 2000   Chronic anticoagulation   Pacemaker- MDT BiV 02/17/14   Acute on chronic systolic CHF (congestive heart failure)   Weight loss   Acute on chronic congestive heart failure   Bladder outlet obstruction   diureisis progressing They are extremely furstrated at poor understanding of the problem and prognosis I am not sure whetehr the ectopy is contribtuing I am bothered by QRS widening by CRT which has been shown to portend poor outcome I am surprised at the filling pressures  For now continue lasix Ask CHF to see and will discuss with them the role of AAD supprssion of ectopy Reprogram the device to see if we can effect QRS duration  OT pT  consult  Signed, Sherryl Manges  MD  02/27/2014

## 2014-02-27 NOTE — Progress Notes (Addendum)
2115 Pt had urinated 300 cc out. Pt was bladder scanned and still had 540 cc in bladder. Pt requested some more time to try and urinate. Pt was educated on why the MD wanted the foley catheter placed. Will continue to monitor pt. 2200 Pt urinated 200 cc out. Pt was bladder scanned and still had 550 cc in bladder. Pt was agreeable for a foley to be placed.  2030 Foley catheter was placed using aseptic technique. Per MD's orders Pt tolerated well. 575 cc immediately return clear yellow urine. Will continue to monitor pt.

## 2014-02-27 NOTE — Progress Notes (Signed)
Advanced Heart Failure Rounding Note   Subjective:   Timothy Durham is an 80 year old with a history of  NICM with chronic systolic HF, LBBB, elective CRTP, non obstructive CAD, HTN, prior CVA, CKD, HLD, PAF and prior bioprosthetic AVR. He is on chronic coumadin therapy - checked by his PCP.   Admitted in October with Afib RVR and was diuresed and started on diltiazem. Echo 01/12/14 showed a drop in his AF to 30-35% with moderate RV dysfunction (previous echo in Dec 2014- 40-45%). He underwent RHC/LHC 01/14/14 and this showed moderate non obstructive CAD with a 50% CFX and mild pulmonary HTN.  RA 16/13 mean 12 mm Hg RV 42/11/16 mm Hg PA 42/23 mean 25 mm Hg PCWP 20/20 mean 17 mm Hg PA 61% AO 90% Cardiac Output (Fick) 4.53 L/min  Cardiac Index (Fick) 2.27 L/min/m2   He was discharged 01/16/14. His diuretics were decreased as an OP secondary to worsening renal function. He came back to the ER 02/09/14 with SOB. He was discharged 11/17 - weight 159  He had LBBB and Dr Ladona Ridgel placed CRT-P device on 11/23.   Readmitted with recurrent HF on 12/2. CXR with persistent effusions and atelectasis. He has had a 10 pound weight gain from his lowest weight 159>169 pounds. pBNP 18948 Placed on 40 mg IV lasix twice a day. Weight down 2  Pounds. QRS duration was longer than pre CRT so device reprogrammed.  Breathing improved. Mild dyspnea with exertion. Frustrated with readmit    Objective:   Weight Range:  Vital Signs:   Temp:  [95.7 F (35.4 C)-98.9 F (37.2 C)] 97.9 F (36.6 C) (12/03 1300) Pulse Rate:  [58-86] 58 (12/03 1245) Resp:  [18-30] 21 (12/03 1245) BP: (124-145)/(41-69) 130/41 mmHg (12/03 1245) SpO2:  [90 %-100 %] 91 % (12/03 1245) Weight:  [159 lb 9.8 oz (72.4 kg)-161 lb 2.5 oz (73.1 kg)] 159 lb 9.8 oz (72.4 kg) (12/03 0439) Last BM Date: 02/26/14  Weight change: Filed Weights   02/26/14 2037 02/27/14 0439  Weight: 161 lb 2.5 oz (73.1 kg) 159 lb 9.8 oz (72.4 kg)     Intake/Output:   Intake/Output Summary (Last 24 hours) at 02/27/14 1452 Last data filed at 02/27/14 0759  Gross per 24 hour  Intake      0 ml  Output   1250 ml  Net  -1250 ml     Physical Exam: General:  Chronically ill appearing. No resp difficulty. Wife present  HEENT: normal Neck: supple. JVP 10 . Carotids 2+ bilat; no bruits. No lymphadenopathy or thryomegaly appreciated. Cor: PMI nondisplaced. Regular rate & rhythm. No rubs, gallops or murmurs. Lungs: clear Abdomen: soft, nontender, + distended. + hepatomegaly. No bruits or masses. Good bowel sounds. Extremities: no cyanosis, clubbing, rash, R and LLE trace -1+ edema  Neuro: alert & orientedx3, cranial nerves grossly intact. moves all 4 extremities w/o difficulty. Affect pleasant GU: Foley  Telemetry: SR with frequent PVCs Labs: Basic Metabolic Panel:  Recent Labs Lab 02/22/14 0100 02/24/14 1606 02/26/14 1623 02/27/14 0245  NA 138 135 137 138  K 4.3 4.0 4.8 3.7  CL 96 98 96 97  CO2 27 28 26 27   GLUCOSE 111* 78 104* 87  BUN 33* 27* 24* 23  CREATININE 1.20 1.3 1.19 1.17  CALCIUM 9.3 8.7 9.1 8.6    Liver Function Tests: No results for input(s): AST, ALT, ALKPHOS, BILITOT, PROT, ALBUMIN in the last 168 hours. No results for input(s): LIPASE, AMYLASE in the last  168 hours. No results for input(s): AMMONIA in the last 168 hours.  CBC:  Recent Labs Lab 02/22/14 0100 02/24/14 1606 02/26/14 1623 02/27/14 0245  WBC 6.7 6.8 8.7 6.2  NEUTROABS 4.9 5.3  --   --   HGB 12.6* 13.1 14.0 11.8*  HCT 37.4* 40.1 41.1 34.9*  MCV 99.7 102.2* 99.3 96.7  PLT 218 237.0 243 210    Cardiac Enzymes:  Recent Labs Lab 02/22/14 0100  TROPONINI <0.30    BNP: BNP (last 3 results)  Recent Labs  02/22/14 0100 02/24/14 1606 02/26/14 1623  PROBNP 19705.0* 2973.0* 18948.0*     Other results:  EKG: NSR with LV pacing 79  Imaging: Dg Chest 2 View  02/26/2014   CLINICAL DATA:  Shortness of breath for 2 months.   EXAM: CHEST  2 VIEW  COMPARISON:  02/24/2014  FINDINGS: The pacer wires are stable. The cardiac silhouette, mediastinal and hilar contours are unchanged. There are persistent bilateral pleural effusions and bibasilar atelectasis. Persistent loculated fluid in the right major fissure. No pneumothorax.  IMPRESSION: Persistent effusions and atelectasis.   Electronically Signed   By: Loralie Champagne M.D.   On: 02/26/2014 17:06      Medications:     Scheduled Medications: . allopurinol  200 mg Oral Daily  . aspirin EC  81 mg Oral Daily  . atorvastatin  40 mg Oral QPM  . clopidogrel  75 mg Oral Daily  . DULoxetine  30 mg Oral Daily  . [START ON 02/28/2014] feeding supplement (ENSURE COMPLETE)  237 mL Oral Q1500  . feeding supplement (RESOURCE BREEZE)  1 Container Oral Q1500  . furosemide  40 mg Intravenous BID  . metoprolol tartrate  25 mg Oral BID  . omega-3 acid ethyl esters  1 g Oral Daily  . pantoprazole  40 mg Oral Daily  . sodium chloride  3 mL Intravenous Q12H     Infusions:     PRN Medications:  sodium chloride, acetaminophen, albuterol, nitroGLYCERIN, ondansetron (ZOFRAN) IV, sodium chloride   Assessment:   1. A/C Systolic Heart Failure NICM  2. PAF on chronic coumadin  3. CKD , Stage III 4. Mod-Severe Mitral Regurgitation  5. S/p Tissue AVR 2006 6. LBBB has BiV Medtronic CRT-P Placed 02/17/14   7. Urinary Retention- foley placed 02/26/14   8. Bilateral pleural effusions 9. Frequent PVCs   Plan/Discussion:    Timothy Durham is an 80 year old readmitted with a/c systolic heart failure and volume overload. Initially presented with NYHA Class IV symptoms with improved symptoms as he has diuresed with IV lasix. He still has fluid on board and sluggish urine output noted. This may represent low output. Due to recent Bi Vi placed 11/23 will not recommend RHC but will place PICC line to check CO-OX and CVP. If CO-OX low may require inotropes. Increase lasix to 80 mg twice a day  with 2.5 mg metolazone. Supplement potassium. Continue foley catheter as he is being aggressively diuresed. Follow renal function closely  BiVi  by Dr Graciela Husbands.   Consult cardiac rehab.    Length of Stay: 1 CLEGG,AMY NP-C 02/27/2014, 2:52 PM   Patient seen and examined with Tonye Becket, NP. We discussed all aspects of the encounter. I agree with the assessment and plan as stated above.   Difficult case. Despite fairly decent numbers on RHC and placement of CRT-P device last month he continues to struggle with advanced HF symptoms and multiple readmissions. Current he appears significantly volume overloaded and  I worry about low output. His PVC burden may also be contributing. Agree with increasing lasix. Will place PICC to follow CVPs and co-ox. Reprogramming of CRT device per Dr. Graciela HusbandsKlein. Will assess PVC burden and if high may benefit from amiodarone to suppress.   We will follow.   Truman Haywardaniel Kalel Harty,MD 7:13 PM  Advanced Heart Failure Team Pager [phone] (M-F; 7a - 4p)  Please contact [facility] for night-coverage after hours (4p -7a ) and weekends on [email or web address]

## 2014-02-27 NOTE — Progress Notes (Signed)
INITIAL NUTRITION ASSESSMENT  DOCUMENTATION CODES Per approved criteria  -Severe malnutrition in the context of chronic illness   Pt meets criteria for severe MALNUTRITION in the context of chronic illness as evidenced by severe depletion of muscle mass and 9% weight loss within one month.  INTERVENTION:  Ensure Complete PO once daily (can mix with ice cream), each supplement provides 350 kcal and 13 grams of protein  Resource Breeze PO once daily (can mix with Ginger Ale or Sprite), each supplement provides 250 kcal and 9 grams of protein  Allow double protein portions with meals  NUTRITION DIAGNOSIS: Increased nutrient needs related to chronic heart disease as evidenced by estimated nutrition needs.   Goal: Intake to meet >90% of estimated nutrition needs.  Monitor:  PO intake, labs, weight trend.  Reason for Assessment: MST  80 y.o. male  Admitting Dx: Acute on chronic combined systolic and diastolic congestive heart failure, NYHA class 3  ASSESSMENT: Admitted on 12/2 with orthopnea. In the ED he had bilateral effusions and a BNP of 19k.  Patient and wife reports that he has lost a lot of weight over the past 2 months; he weighed 180 lb in October, now down to 159 lb. He suspects a lot of weight loss is due to fluid status and Lasix. He agreed to try Ensure or [employer] Breeze even though he does not like them. Encouraged patient and wife to try mixing the supplements with ice cream or ginger ale/sprite to improve taste.  Suspect patient has been restricting diet so much that he has not been consuming adequate calories to maintain appropriate weight.  "Heart Failure Nutrition Therapy for the Undernourished" handout from the Academy of [employer] Dietetics provided.  Nutrition Focused Physical Exam:  Subcutaneous Fat:  Orbital Region: mild depletion Upper Arm Region: mild depletion Thoracic and Lumbar Region: moderate depletion  Muscle:  Temple Region: mild  depletion Clavicle Bone Region: mild depletion Clavicle and Acromion Bone Region: severe depletion Scapular Bone Region: moderate depletion Dorsal Hand: mild depletion Patellar Region: WNL Anterior Thigh Region: WNL Posterior Calf Region: mild depletion  Edema: none   Height: Ht Readings from Last 1 Encounters:  02/26/14 5\' 11"  (1.803 m)    Weight: Wt Readings from Last 1 Encounters:  02/27/14 159 lb 9.8 oz (72.4 kg)    Ideal Body Weight: 78.2 kg  % Ideal Body Weight: 93%  Wt Readings from Last 10 Encounters:  02/27/14 159 lb 9.8 oz (72.4 kg)  02/24/14 169 lb 3.2 oz (76.749 kg)  02/21/14 166 lb (75.297 kg)  02/17/14 164 lb (74.39 kg)  02/11/14 159 lb 3.2 oz (72.213 kg)  01/31/14 174 lb 8 oz (79.153 kg)  01/21/14 172 lb (78.019 kg)  01/16/14 173 lb 8 oz (78.7 kg)  03/26/13 181 lb (82.101 kg)    Usual Body Weight: 174 lb (1 month ago)  % Usual Body Weight: 91%  BMI:  Body mass index is 22.27 kg/(m^2).  Estimated Nutritional Needs: Kcal: 2100-2300 Protein: 100-120 gm Fluid: 2.1-2.3 L  Skin: no issues  Diet Order: Diet 2 gram sodium  EDUCATION NEEDS: -Education needs addressed, discussed ways to increase protein and calorie intake within HF/2 gm sodium diet restrictions   Intake/Output Summary (Last 24 hours) at 02/27/14 0855 Last data filed at 02/27/14 0759  Gross per 24 hour  Intake      0 ml  Output   1250 ml  Net  -1250 ml    Last BM: 12/2   Labs:  Recent Labs Lab 02/24/14 1606 02/26/14 1623 02/27/14 0245  NA 135 137 138  K 4.0 4.8 3.7  CL 98 96 97  CO2 28 26 27   BUN 27* 24* 23  CREATININE 1.3 1.19 1.17  CALCIUM 8.7 9.1 8.6  GLUCOSE 78 104* 87    CBG (last 3)  No results for input(s): GLUCAP in the last 72 hours.  Scheduled Meds: . allopurinol  200 mg Oral Daily  . aspirin EC  81 mg Oral Daily  . atorvastatin  40 mg Oral QPM  . clopidogrel  75 mg Oral Daily  . DULoxetine  30 mg Oral Daily  . furosemide  40 mg Intravenous  BID  . metoprolol tartrate  25 mg Oral BID  . omega-3 acid ethyl esters  1 g Oral Daily  . pantoprazole  40 mg Oral Daily  . sodium chloride  3 mL Intravenous Q12H    Continuous Infusions:   Past Medical History  Diagnosis Date  . Arthritis   . Non-obstructive CAD     a. 12/2013 NSTEMI/Cath: LM nl, LAD 20, LCX 40-50, RCA 20.  Marland Kitchen Hypertension   . Stroke     a. July 2000;  b. January 2007;  c. TIA in 2014.  . CKD (chronic kidney disease), stage III   . GERD (gastroesophageal reflux disease)   . Foot pain, bilateral   . H/O hematuria   . Hyperlipidemia   . Vitamin D deficiency   . Chronic combined systolic and diastolic CHF (congestive heart failure)     a. 12/2013 Echo: EF 30-35%, mod conc LVH, Gr 3 DD, mild AI, mod-sev MR, mod dil LA, mild TR.  Marland Kitchen PAF (paroxysmal atrial fibrillation)     a. CHA2DS2VASc = 7--> Chronic Coumadin.  Marland Kitchen NICM (nonischemic cardiomyopathy)     a. 12/2013 Echo: EF 30-35%.  . Aortic valve prosthesis present     a. 2006: S/P bioprosthetic AVR.  Marland Kitchen PAD (peripheral artery disease)     a. s/p LLE stenting.  . Carotid arterial disease     a. s/p R CEA  . History of tobacco abuse   . Moderate to Severe Mitral Regurgitation     a. 12/2013 Echo: Mod-Sev MR.  Marland Kitchen Anxiety   . COPD (chronic obstructive pulmonary disease)   . Coronary arteriosclerosis Oct 2015    med Rx    Past Surgical History  Procedure Laterality Date  . Aortic valve replacement (avr)/coronary artery bypass grafting (cabg)  12/2008  . Eye surgery    . Carotid angiogram  1996  . Left lower extremity stent    . Carotid endarterectomy    . Insert / replace / remove pacemaker    . Coronary angiogram  Oct 2015  . Pacemaker insertion  Nov 2015    MDT BiV    Joaquin Courts, RD, LDN, CNSC Pager [phone] After Hours Pager [phone]

## 2014-02-27 NOTE — Progress Notes (Signed)
Utilization review completed.  

## 2014-02-27 NOTE — Progress Notes (Signed)
ANTICOAGULATION CONSULT NOTE Pharmacy Consult for coumadin Indication: atrial fibrillation  Allergies  Allergen Reactions  . Ambien [Zolpidem] Other (See Comments)    Hallucinations  . Ativan [Lorazepam] Other (See Comments)    hallucinations  . Penicillins     Unknown childhood reaction   . Sulfa Antibiotics     unknown   Vital Signs: Temp: 97.9 F (36.6 C) (12/03 0700) Temp Source: Oral (12/03 0700) BP: 139/66 mmHg (12/03 0804) Pulse Rate: 72 (12/03 0804)  Labs:  Recent Labs  02/24/14 1606 02/26/14 1623 02/27/14 0245  HGB 13.1 14.0 11.8*  HCT 40.1 41.1 34.9*  PLT 237.0 243 210  LABPROT  --  34.9* 34.7*  INR  --  3.44* 3.41*  CREATININE 1.3 1.19 1.17    Estimated Creatinine Clearance: 50.7 mL/min (by C-G formula based on Cr of 1.17).    Assessment: Patient is an 80 y.o M s/p recent pacemaker (02/17/14) on coumadin PTA for afib and recurrent CVAs. Home coumadin regimen is 4mg  daily except 1mg  on Sundays. He presented 12/2 to the ED with c/o SOB.   Outpatient Cardiology note indicated that he reported nosebleed on the evening of 11/30 and early morning of 12/01 with an INR of 7.1 at his PCP office.  He stated that he was instructed to hold his dose on 12/01 and 12/02 and to come back for repeat INR check on 12/3.  INR is 3.44 12/2 and dose held. INR 3.41 today.  No bleeding reported. Hg 13.1>14>11.8.  PLTC ok.   Goal of Therapy:  INR 2-3 Monitor platelets by anticoagulation protocol: Yes   Plan:  1) will hold coumadin one more day 2) daily INR  Herby Abraham, Pharm.D. [phone] 02/27/2014 10:07 AM

## 2014-02-28 ENCOUNTER — Inpatient Hospital Stay (HOSPITAL_COMMUNITY): Payer: Medicare HMO

## 2014-02-28 ENCOUNTER — Ambulatory Visit: Payer: Medicare HMO

## 2014-02-28 DIAGNOSIS — I5043 Acute on chronic combined systolic (congestive) and diastolic (congestive) heart failure: Secondary | ICD-10-CM | POA: Insufficient documentation

## 2014-02-28 LAB — CARBOXYHEMOGLOBIN
CARBOXYHEMOGLOBIN: 1.3 % (ref 0.5–1.5)
METHEMOGLOBIN: 1.4 % (ref 0.0–1.5)
O2 SAT: 79.3 %
TOTAL HEMOGLOBIN: 11.6 g/dL — AB (ref 13.5–18.0)

## 2014-02-28 LAB — BASIC METABOLIC PANEL
Anion gap: 16 — ABNORMAL HIGH (ref 5–15)
BUN: 25 mg/dL — ABNORMAL HIGH (ref 6–23)
CO2: 27 mEq/L (ref 19–32)
Calcium: 8.2 mg/dL — ABNORMAL LOW (ref 8.4–10.5)
Chloride: 99 mEq/L (ref 96–112)
Creatinine, Ser: 1.23 mg/dL (ref 0.50–1.35)
GFR calc Af Amer: 62 mL/min — ABNORMAL LOW (ref >90)
GFR calc non Af Amer: 53 mL/min — ABNORMAL LOW (ref >90)
Glucose, Bld: 83 mg/dL (ref 70–99)
Potassium: 3.5 mEq/L — ABNORMAL LOW (ref 3.7–5.3)
Sodium: 142 mEq/L (ref 137–147)

## 2014-02-28 LAB — PROTIME-INR
INR: 2.67 — AB (ref 0.00–1.49)
Prothrombin Time: 28.6 seconds — ABNORMAL HIGH (ref 11.6–15.2)

## 2014-02-28 MED ORDER — CARVEDILOL 3.125 MG PO TABS
3.1250 mg | ORAL_TABLET | Freq: Two times a day (BID) | ORAL | Status: DC
  Administered 2014-02-28 – 2014-03-03 (×7): 3.125 mg via ORAL
  Filled 2014-02-28 (×10): qty 1

## 2014-02-28 MED ORDER — METOLAZONE 2.5 MG PO TABS
2.5000 mg | ORAL_TABLET | Freq: Every day | ORAL | Status: DC
  Administered 2014-02-28: 2.5 mg via ORAL
  Filled 2014-02-28 (×3): qty 1

## 2014-02-28 MED ORDER — WARFARIN - PHARMACIST DOSING INPATIENT: Freq: Every day | Status: DC

## 2014-02-28 MED ORDER — WARFARIN SODIUM 4 MG PO TABS
4.0000 mg | ORAL_TABLET | Freq: Once | ORAL | Status: AC
  Administered 2014-02-28: 4 mg via ORAL
  Filled 2014-02-28: qty 1

## 2014-02-28 MED ORDER — LISINOPRIL 5 MG PO TABS
5.0000 mg | ORAL_TABLET | Freq: Every day | ORAL | Status: DC
  Administered 2014-02-28 – 2014-03-03 (×4): 5 mg via ORAL
  Filled 2014-02-28 (×5): qty 1

## 2014-02-28 NOTE — Progress Notes (Addendum)
Advanced Heart Failure Rounding Note   Subjective:   Timothy Durham is an 80 year old with a history of  NICM with chronic systolic HF, LBBB, elective CRTP, non obstructive CAD, HTN, prior CVA, CKD, HLD, PAF and prior bioprosthetic AVR. He is on chronic coumadin therapy - checked by his PCP.   Admitted in October with Afib RVR and was diuresed and started on diltiazem. Echo 01/12/14 showed a drop in his AF to 30-35% with moderate RV dysfunction (previous echo in Dec 2014- 40-45%). He underwent RHC/LHC 01/14/14 and this showed moderate non obstructive CAD with a 50% CFX and mild pulmonary HTN.  RA 16/13 mean 12 mm Hg RV 42/11/16 mm Hg PA 42/23 mean 25 mm Hg PCWP 20/20 mean 17 mm Hg PA 61% AO 90% Cardiac Output (Fick) 4.53 L/min  Cardiac Index (Fick) 2.27 L/min/m2   He was discharged 01/16/14. His diuretics were decreased as an OP secondary to worsening renal function. He came back to the ER 02/09/14 with SOB. He was discharged 11/17 - weight 159  He had LBBB and Dr Ladona Ridgelaylor placed CRT-P device on 11/23.   Readmitted with recurrent HF on 12/2. CXR with persistent effusions and atelectasis. He has had a 10 pound weight gain from his lowest weight 159>169 pounds. pBNP 18948  QRS duration was longer than pre CRT so device reprogrammed.  PICC placed last night. Lasix increased. -2.5L. Co-ox 79%. Weight down 4 pounds. Renal function stable. Says breathing better. CVP 7-8.   Objective:   Weight Range:  Vital Signs:   Temp:  [97.5 F (36.4 C)-97.9 F (36.6 C)] 97.5 F (36.4 C) (12/04 0405) Pulse Rate:  [58-82] 67 (12/04 0405) Resp:  [19-30] 19 (12/04 0405) BP: (117-139)/(40-66) 133/46 mmHg (12/04 0405) SpO2:  [91 %-97 %] 93 % (12/04 0405) Weight:  [70.6 kg (155 lb 10.3 oz)] 70.6 kg (155 lb 10.3 oz) (12/04 0405) Last BM Date: 02/27/14  Weight change: Filed Weights   02/26/14 2037 02/27/14 0439 02/28/14 0405  Weight: 73.1 kg (161 lb 2.5 oz) 72.4 kg (159 lb 9.8 oz) 70.6 kg (155 lb 10.3  oz)    Intake/Output:   Intake/Output Summary (Last 24 hours) at 02/28/14 0644 Last data filed at 02/28/14 0405  Gross per 24 hour  Intake      0 ml  Output   1400 ml  Net  -1400 ml     Physical Exam: General:  Chronically ill appearing. No resp difficulty. Wife present  HEENT: normal Neck: supple. JVP 8. Carotids 2+ bilat; no bruits. No lymphadenopathy or thryomegaly appreciated. Cor: PMI nondisplaced. Regular rate & rhythm. No rubs, gallops or murmurs. Lungs: clear Abdomen: soft, nontender, nondistended. + hepatomegaly. No bruits or masses. Good bowel sounds. Extremities: no cyanosis, clubbing, rash, R and LLE trace edema  + PICC Neuro: alert & orientedx3, cranial nerves grossly intact. moves all 4 extremities w/o difficulty. Affect pleasant GU: Foley  Telemetry: SR with frequent PVCs  Labs: Basic Metabolic Panel:  Recent Labs Lab 02/22/14 0100 02/24/14 1606 02/26/14 1623 02/27/14 0245 02/28/14 0415  NA 138 135 137 138 142  K 4.3 4.0 4.8 3.7 3.5*  CL 96 98 96 97 99  CO2 27 28 26 27 27   GLUCOSE 111* 78 104* 87 83  BUN 33* 27* 24* 23 25*  CREATININE 1.20 1.3 1.19 1.17 1.23  CALCIUM 9.3 8.7 9.1 8.6 8.2*    Liver Function Tests: No results for input(s): AST, ALT, ALKPHOS, BILITOT, PROT, ALBUMIN in the last  168 hours. No results for input(s): LIPASE, AMYLASE in the last 168 hours. No results for input(s): AMMONIA in the last 168 hours.  CBC:  Recent Labs Lab 02/22/14 0100 02/24/14 1606 02/26/14 1623 02/27/14 0245  WBC 6.7 6.8 8.7 6.2  NEUTROABS 4.9 5.3  --   --   HGB 12.6* 13.1 14.0 11.8*  HCT 37.4* 40.1 41.1 34.9*  MCV 99.7 102.2* 99.3 96.7  PLT 218 237.0 243 210    Cardiac Enzymes:  Recent Labs Lab 02/22/14 0100  TROPONINI <0.30    BNP: BNP (last 3 results)  Recent Labs  02/22/14 0100 02/24/14 1606 02/26/14 1623  PROBNP 19705.0* 2973.0* 18948.0*     Other results:  EKG: NSR with LV pacing 79  Imaging: Dg Chest 2  View  02/26/2014   CLINICAL DATA:  Shortness of breath for 2 months.  EXAM: CHEST  2 VIEW  COMPARISON:  02/24/2014  FINDINGS: The pacer wires are stable. The cardiac silhouette, mediastinal and hilar contours are unchanged. There are persistent bilateral pleural effusions and bibasilar atelectasis. Persistent loculated fluid in the right major fissure. No pneumothorax.  IMPRESSION: Persistent effusions and atelectasis.   Electronically Signed   By: Loralie Champagne M.D.   On: 02/26/2014 17:06   Dg Chest Port 1 View  02/27/2014   CLINICAL DATA:  Status post line placement.  EXAM: PORTABLE CHEST - 1 VIEW  COMPARISON:  February 26, 2014.  FINDINGS: Stable cardiomediastinal silhouette. Sternotomy wires are noted. Left-sided pacemaker is in grossly good position. No pneumothorax is noted. Mild right pleural effusion is noted which is stable. Fluid is noted in the right minor fissure as well. Interval placement of right-sided PICC line with distal tip overlying expected position of the SVC.  IMPRESSION: Interval placement of right-sided PICC line with distal tip overlying expected position of the SVC. Mild right pleural effusion is again noted.   Electronically Signed   By: Roque Lias M.D.   On: 02/27/2014 19:39     Medications:     Scheduled Medications: . allopurinol  200 mg Oral Daily  . antiseptic oral rinse  7 mL Mouth Rinse BID  . aspirin EC  81 mg Oral Daily  . atorvastatin  40 mg Oral QPM  . clopidogrel  75 mg Oral Daily  . DULoxetine  30 mg Oral Daily  . feeding supplement (ENSURE COMPLETE)  237 mL Oral Q1500  . feeding supplement (RESOURCE BREEZE)  1 Container Oral Q1500  . furosemide  40 mg Intravenous BID  . metoprolol tartrate  25 mg Oral BID  . omega-3 acid ethyl esters  1 g Oral Daily  . pantoprazole  40 mg Oral Daily  . sodium chloride  3 mL Intravenous Q12H    Infusions:    PRN Medications: sodium chloride, acetaminophen, albuterol, nitroGLYCERIN, ondansetron (ZOFRAN) IV,  sodium chloride   Assessment:   1. A/C Systolic Heart Failure due to NICM EF 30-35% 2. PAF on chronic coumadin  3. CKD , Stage III 4. Mod-Severe Mitral Regurgitation  5. S/p Tissue AVR 2006 6. LBBB has BiV Medtronic CRT-P Placed 02/17/14   7. Urinary Retention- foley placed 02/26/14   8. Bilateral pleural effusions 9. Frequent PVCs   Plan/Discussion:    Difficult case. Despite fairly decent numbers on RHC and placement of CRT-P device last month he continues to struggle with advanced HF symptoms and multiple readmissions.   He has diuresed well. CVP near normal now and weight below previous baseline. Given CXR will continue  to diurese one more day as renal function tolerates. Co-ox is ok suggesting normal output despite markedly depressed EF. Will change metoprolol tartrate to carvedilol and add low-dose lisinopril. Get repeat CXR tomorrow. May benefit from switching lasix 80 bid to torsemide 40 bis on d/c. Will need f/u in HF Clinic as well.   His PVC burden may be contributing to his symptoms. Reprogramming of CRT device per Dr. Graciela Husbands. Will assess PVC burden and if high may benefit from amiodarone to suppress.  Will consult CR and PT. I wonder if he would qualify for CIR?   We will follow.   Truman Hayward 6:44 AM  Advanced Heart Failure Team Pager [phone] (M-F; 7a - 4p)  Please contact [facility] for night-coverage after hours (4p -7a ) and weekends on [email or web address]

## 2014-02-28 NOTE — Progress Notes (Signed)
Patient Name: Timothy Durham      SUBJECTIVE:*feels better and appreciates input  Past Medical History  Diagnosis Date  . Arthritis   . Non-obstructive CAD     a. 12/2013 NSTEMI/Cath: LM nl, LAD 20, LCX 40-50, RCA 20.  Marland Kitchen. Hypertension   . Stroke     a. July 2000;  b. January 2007;  c. TIA in 2014.  . CKD (chronic kidney disease), stage III   . GERD (gastroesophageal reflux disease)   . Foot pain, bilateral   . H/O hematuria   . Hyperlipidemia   . Vitamin D deficiency   . Chronic combined systolic and diastolic CHF (congestive heart failure)     a. 12/2013 Echo: EF 30-35%, mod conc LVH, Gr 3 DD, mild AI, mod-sev MR, mod dil LA, mild TR.  Marland Kitchen. PAF (paroxysmal atrial fibrillation)     a. CHA2DS2VASc = 7--> Chronic Coumadin.  Marland Kitchen. NICM (nonischemic cardiomyopathy)     a. 12/2013 Echo: EF 30-35%.  . Aortic valve prosthesis present     a. 2006: S/P bioprosthetic AVR.  Marland Kitchen. PAD (peripheral artery disease)     a. s/p LLE stenting.  . Carotid arterial disease     a. s/p R CEA  . History of tobacco abuse   . Moderate to Severe Mitral Regurgitation     a. 12/2013 Echo: Mod-Sev MR.  Marland Kitchen. Anxiety   . COPD (chronic obstructive pulmonary disease)   . Coronary arteriosclerosis Oct 2015    med Rx    Scheduled Meds:  Scheduled Meds: . allopurinol  200 mg Oral Daily  . antiseptic oral rinse  7 mL Mouth Rinse BID  . aspirin EC  81 mg Oral Daily  . atorvastatin  40 mg Oral QPM  . carvedilol  3.125 mg Oral BID WC  . clopidogrel  75 mg Oral Daily  . DULoxetine  30 mg Oral Daily  . feeding supplement (ENSURE COMPLETE)  237 mL Oral Q1500  . feeding supplement (RESOURCE BREEZE)  1 Container Oral Q1500  . furosemide  40 mg Intravenous BID  . lisinopril  5 mg Oral Daily  . metolazone  2.5 mg Oral Daily  . omega-3 acid ethyl esters  1 g Oral Daily  . pantoprazole  40 mg Oral Daily  . sodium chloride  3 mL Intravenous Q12H   Continuous Infusions:  sodium chloride, acetaminophen, albuterol,  nitroGLYCERIN, ondansetron (ZOFRAN) IV, sodium chloride    PHYSICAL EXAM Filed Vitals:   02/27/14 1940 02/27/14 2148 02/27/14 2308 02/28/14 0405  BP: 117/40 118/41 129/51 133/46  Pulse: 82 73 71 67  Temp: 97.9 F (36.6 C)  97.8 F (36.6 C) 97.5 F (36.4 C)  TempSrc: Oral  Oral Oral  Resp: 28  19 19   Height:      Weight:    155 lb 10.3 oz (70.6 kg)  SpO2: 93% 92% 93% 93%    Well developed and nourished in mild resp  distress HENT normal Neck supple with JVP-7-8 Clear Regular rate and rhythm, no murmurs or gallops Abd-soft with active BS No Clubbing cyanosis tr edema Skin-warm and dry A & Oriented  Grossly normal sensory and motor function  TELEMETRY: Reviewed telemetry pt in lessecotopy   Intake/Output Summary (Last 24 hours) at 02/28/14 0757 Last data filed at 02/28/14 0405  Gross per 24 hour  Intake      0 ml  Output   1400 ml  Net  -1400 ml    LABS:  Basic Metabolic Panel:  Recent Labs Lab 02/22/14 0100 02/24/14 1606 02/26/14 1623 02/27/14 0245 02/28/14 0415  NA 138 135 137 138 142  K 4.3 4.0 4.8 3.7 3.5*  CL 96 98 96 97 99  CO2 27 28 26 27 27   GLUCOSE 111* 78 104* 87 83  BUN 33* 27* 24* 23 25*  CREATININE 1.20 1.3 1.19 1.17 1.23  CALCIUM 9.3 8.7 9.1 8.6 8.2*   Cardiac Enzymes: No results for input(s): CKTOTAL, CKMB, CKMBINDEX, TROPONINI in the last 72 hours. CBC:  Recent Labs Lab 02/22/14 0100 02/24/14 1606 02/26/14 1623 02/27/14 0245  WBC 6.7 6.8 8.7 6.2  NEUTROABS 4.9 5.3  --   --   HGB 12.6* 13.1 14.0 11.8*  HCT 37.4* 40.1 41.1 34.9*  MCV 99.7 102.2* 99.3 96.7  PLT 218 237.0 243 210   PROTIME:  Recent Labs  02/26/14 1623 02/27/14 0245 02/28/14 0415  LABPROT 34.9* 34.7* 28.6*  INR 3.44* 3.41* 2.67*   Liver Function Tests: No results for input(s): AST, ALT, ALKPHOS, BILITOT, PROT, ALBUMIN in the last 72 hours. No results for input(s): LIPASE, AMYLASE in the last 72 hours. BNP: BNP (last 3 results)  Recent Labs   02/22/14 0100 02/24/14 1606 02/26/14 1623  PROBNP 19705.0* 2973.0* 18948.0*   D-Dimer: No results for input(s): DDIMER in the last 72 hours. Hemoglobin A1C: No results for input(s): HGBA1C in the last 72 hours. Fasting Lipid Panel: No results for input(s): CHOL, HDL, LDLCALC, TRIG, CHOLHDL, LDLDIRECT in the last 72 hours. Thyroid Function Tests:  Recent Labs  02/27/14 0245  TSH 3.030   Anemia Panel: No results for input(s): VITAMINB12, FOLATE, FERRITIN, TIBC, IRON, RETICCTPCT in the last 72 hours.   Device Interrogation pending   ASSESSMENT AND PLAN:  Active Problems:   TIA 2007, Dec 2014 (Plavix added)   Hypertension   Coronary artery disease-moderate Oct 2015   CKD (chronic kidney disease), stage III   Moderate to Severe Mitral Regurgitation   PAD- LLE PTA, RCEA, attempted LICA stent   Tissue AVR 2006 (in [place])   NICM (nonischemic cardiomyopathy)   PAF   Hyperlipidemia   LBBB (left bundle branch block)   History of CVA- Rt brain 2000   Chronic anticoagulation   Pacemaker- MDT BiV 02/17/14   Acute on chronic systolic CHF (congestive heart failure)   Weight loss   Acute on chronic congestive heart failure   Bladder outlet obstruction   Protein-calorie malnutrition, severe  willl add amio for rhtyhm control and regularization   Discussed SE with family Also will try and reporogram device (RV-LV timing) to decrease QRS durstion  Signed, Sherryl Manges MD  02/28/2014

## 2014-02-28 NOTE — Evaluation (Signed)
Physical Therapy Evaluation Patient Details Name: Timothy Durham MRN: [id number] DOB: 05/20/1930 Today's Date: 02/28/2014   History of Present Illness  80 y.o. male with PMH of HTN, CKD, PAD s/p stent (plavix), CVA with residual L sided weakness, s/p AVR, A fib (coumadin), CHF (not on diuretics x 6 month), recent admission with A-fib w/RVR and had MDT BiV pacemaker placed.  Admitted with orthopnea and bilateral effusions and a BNP of 19k.   Clinical Impression  Patient presents with decreased balance, decreased activity tolerance limiting safety and independence.  Discussed cardiac rehab for patient, but he seems more appropriate for HHPT initially, then, due to proximity, patient interested in in outpatient PT at [facility].    Follow Up Recommendations Home health PT;Supervision - Intermittent    Equipment Recommendations  None recommended by PT    Recommendations for Other Services       Precautions / Restrictions Precautions Precautions: Fall      Mobility  Bed Mobility               General bed mobility comments: up in chair  Transfers Overall transfer level: Needs assistance Equipment used: None Transfers: Sit to/from Stand Sit to Stand: Min guard         General transfer comment: mildly unsteady rising from chair with UE support; plus has multiple lines  Ambulation/Gait Ambulation/Gait assistance: Min guard Ambulation Distance (Feet): 400 Feet Assistive device: None Gait Pattern/deviations: Step-through pattern     General Gait Details: mild loss of balance if turning head to look one direction while walking other direction  Stairs            Wheelchair Mobility    Modified Rankin (Stroke Patients Only)       Balance Overall balance assessment: Needs assistance   Sitting balance-Leahy Scale: Good       Standing balance-Leahy Scale: Fair Standing balance comment: stood at sink to brush teeth 3 minutes with supervision; mildly unsteady  walking unsupported if turning head                              Pertinent Vitals/Pain Pain Assessment: No/denies pain    Home Living Family/patient expects to be discharged to:: Private residence Living Arrangements: Spouse/significant other Available Help at Discharge: Family;Available 24 hours/day Type of Home: House Home Access: Stairs to enter   [employer] of Steps: 1 step to patio, one step to house Home Layout: One level Home Equipment: None      Prior Function Level of Independence: Independent         Comments: but very sedentary per his report; 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     Hand Dominance   Dominant Hand: Left    Extremity/Trunk Assessment               Lower Extremity Assessment: Generalized weakness         Communication   Communication: No difficulties  Cognition Arousal/Alertness: Awake/alert Behavior During Therapy: WFL for tasks assessed/performed Overall Cognitive Status: Within Functional Limits for tasks assessed                      General Comments      Exercises        Assessment/Plan    PT Assessment Patient needs continued PT services  PT Diagnosis Abnormality of gait;Generalized weakness   PT Problem List Decreased strength;Decreased balance;Decreased mobility;Cardiopulmonary status limiting activity  PT Treatment Interventions DME instruction;Gait training;Balance training;Functional mobility training;Patient/family education;Therapeutic activities;Therapeutic exercise   PT Goals (Current goals can be found in the Care Plan section) Acute Rehab PT Goals Patient Stated Goal: To get stronger PT Goal Formulation: With patient Time For Goal Achievement: 03/14/14 Potential to Achieve Goals: Good    Frequency Min 3X/week   Barriers to discharge        Co-evaluation               End of Session Equipment Utilized During Treatment: Gait belt Activity Tolerance: Patient  tolerated treatment well Patient left: in chair;with call bell/phone within reach           Time: 1147-1210 PT Time Calculation (min) (ACUTE ONLY): 23 min   Charges:   PT Evaluation $Initial PT Evaluation Tier I: 1 Procedure PT Treatments $Gait Training: 8-22 mins   PT G Codes:          Maliaka Brasington,CYNDI 02/28/2014, 1:34 PM Sheran Lawlessyndi Dakisha Schoof, PT [phone] 02/28/2014

## 2014-02-28 NOTE — Progress Notes (Signed)
CARDIAC REHAB PHASE I   PRE:  Rate/Rhythm: 79 paced  BP:  Supine:   Sitting: 132/44  Standing:    SaO2: 94%RA  MODE:  Ambulation: 600 ft   POST:  Rate/Rhythm: 96 paced  BP:  Supine:   Sitting: 121/75  Standing:    SaO2: 98%RA 1350-1418 Pt walked 600 ft on RA with gait belt use and asst x 1 and one asst to maintain equipment. Pt tolerated well. A little wobbly upon standing. No DOE noted. Back to recliner after walk.   Luetta Nutting, RN BSN  02/28/2014 2:14 PM

## 2014-02-28 NOTE — Progress Notes (Signed)
Device reprogrammed to SAV of (previously ) with narrowing of QRS and fusion pacing. LV offset changed from simultaneous to LV first by .  Gypsy Balsam, RN 02/28/2014 1:47 PM

## 2014-02-28 NOTE — Progress Notes (Signed)
ANTICOAGULATION CONSULT NOTE Pharmacy Consult for coumadin Indication: atrial fibrillation  Allergies  Allergen Reactions  . Ambien [Zolpidem] Other (See Comments)    Hallucinations  . Ativan [Lorazepam] Other (See Comments)    hallucinations  . Penicillins     Unknown childhood reaction   . Sulfa Antibiotics     unknown   Vital Signs: Temp: 97.7 F (36.5 C) (12/04 0700) Temp Source: Oral (12/04 0700) BP: 133/46 mmHg (12/04 0405) Pulse Rate: 67 (12/04 0405)  Labs:  Recent Labs  02/26/14 1623 02/27/14 0245 02/28/14 0415  HGB 14.0 11.8*  --   HCT 41.1 34.9*  --   PLT 243 210  --   LABPROT 34.9* 34.7* 28.6*  INR 3.44* 3.41* 2.67*  CREATININE 1.19 1.17 1.23    Estimated Creatinine Clearance: 47 mL/min (by C-G formula based on Cr of 1.23).    Assessment: Patient is an 80 y.o M s/p recent pacemaker (02/17/14) on coumadin PTA for afib and recurrent CVAs. Home coumadin regimen is 4mg  daily except 1mg  on Sundays.   He presented 12/2 to the ED with c/o SOB. Outpatient Cardiology note indicated that he reported nosebleed on the evening of 11/30 and early morning of 12/01 with an INR of 7.1 at his PCP office.  He stated that he was instructed to hold his dose on 12/01 and 12/02 and to come back for repeat INR check on 12/3.  INR is 3.44 12/2 and dose held.   INR now down to 2.67 today.  No bleeding reported. Hg 13.1>14>11.8.  PLTC ok. Per EP notes plan to add amio so will need to watch INR closely.  Goal of Therapy:  INR 2-3 Monitor platelets by anticoagulation protocol: Yes   Plan:  1) warfarin 4mg  tonight 2) daily INR  Sheppard Coil PharmD., BCPS Clinical Pharmacist Pager [phone] 02/28/2014 9:20 AM

## 2014-03-01 ENCOUNTER — Inpatient Hospital Stay (HOSPITAL_COMMUNITY): Payer: Medicare HMO

## 2014-03-01 DIAGNOSIS — N320 Bladder-neck obstruction: Secondary | ICD-10-CM

## 2014-03-01 DIAGNOSIS — Z954 Presence of other heart-valve replacement: Secondary | ICD-10-CM

## 2014-03-01 LAB — BASIC METABOLIC PANEL
Anion gap: 13 (ref 5–15)
Anion gap: 14 (ref 5–15)
BUN: 28 mg/dL — ABNORMAL HIGH (ref 6–23)
BUN: 27 mg/dL — ABNORMAL HIGH (ref 6–23)
CO2: 30 mEq/L (ref 19–32)
CO2: 31 mEq/L (ref 19–32)
Calcium: 8.5 mg/dL (ref 8.4–10.5)
Calcium: 8.5 mg/dL (ref 8.4–10.5)
Chloride: 95 mEq/L — ABNORMAL LOW (ref 96–112)
Chloride: 94 mEq/L — ABNORMAL LOW (ref 96–112)
Creatinine, Ser: 1.32 mg/dL (ref 0.50–1.35)
Creatinine, Ser: 1.23 mg/dL (ref 0.50–1.35)
GFR calc Af Amer: 57 mL/min — ABNORMAL LOW (ref >90)
GFR calc Af Amer: 62 mL/min — ABNORMAL LOW (ref >90)
GFR calc non Af Amer: 49 mL/min — ABNORMAL LOW (ref >90)
GFR, EST NON AFRICAN AMERICAN: 53 mL/min — AB (ref >90)
Glucose, Bld: 96 mg/dL (ref 70–99)
Glucose, Bld: 86 mg/dL (ref 70–99)
POTASSIUM: 3.4 meq/L — AB (ref 3.7–5.3)
Potassium: 3.3 mEq/L — ABNORMAL LOW (ref 3.7–5.3)
SODIUM: 139 meq/L (ref 137–147)
Sodium: 138 mEq/L (ref 137–147)

## 2014-03-01 LAB — PROTIME-INR
INR: 2.09 — ABNORMAL HIGH (ref 0.00–1.49)
Prothrombin Time: 23.6 seconds — ABNORMAL HIGH (ref 11.6–15.2)

## 2014-03-01 LAB — CARBOXYHEMOGLOBIN
Carboxyhemoglobin: 1.1 % (ref 0.5–1.5)
Methemoglobin: 0.8 % (ref 0.0–1.5)
O2 SAT: 64.2 %
Total hemoglobin: 12.1 g/dL — ABNORMAL LOW (ref 13.5–18.0)

## 2014-03-01 MED ORDER — SPIRONOLACTONE 12.5 MG HALF TABLET
12.5000 mg | ORAL_TABLET | Freq: Every day | ORAL | Status: DC
  Administered 2014-03-01 – 2014-03-03 (×3): 12.5 mg via ORAL
  Filled 2014-03-01 (×3): qty 1

## 2014-03-01 MED ORDER — TAMSULOSIN HCL 0.4 MG PO CAPS
0.4000 mg | ORAL_CAPSULE | Freq: Every day | ORAL | Status: DC
  Administered 2014-03-01 – 2014-03-03 (×3): 0.4 mg via ORAL
  Filled 2014-03-01 (×3): qty 1

## 2014-03-01 MED ORDER — POTASSIUM CHLORIDE CRYS ER 20 MEQ PO TBCR
EXTENDED_RELEASE_TABLET | ORAL | Status: AC
  Filled 2014-03-01: qty 1

## 2014-03-01 MED ORDER — AMIODARONE HCL 200 MG PO TABS
200.0000 mg | ORAL_TABLET | Freq: Two times a day (BID) | ORAL | Status: DC
  Administered 2014-03-01 – 2014-03-03 (×5): 200 mg via ORAL
  Filled 2014-03-01 (×6): qty 1

## 2014-03-01 MED ORDER — POTASSIUM CHLORIDE CRYS ER 20 MEQ PO TBCR
EXTENDED_RELEASE_TABLET | ORAL | Status: AC
  Administered 2014-03-01: 20 meq
  Filled 2014-03-01: qty 2

## 2014-03-01 MED ORDER — POTASSIUM CHLORIDE CRYS ER 20 MEQ PO TBCR
40.0000 meq | EXTENDED_RELEASE_TABLET | Freq: Once | ORAL | Status: AC
  Administered 2014-03-01: 40 meq via ORAL

## 2014-03-01 MED ORDER — ASPIRIN 81 MG PO CHEW: 81.0000 mg | CHEWABLE_TABLET | Freq: Every day | ORAL | Status: DC

## 2014-03-01 MED ORDER — POTASSIUM CHLORIDE CRYS ER 20 MEQ PO TBCR
EXTENDED_RELEASE_TABLET | ORAL | Status: AC
  Administered 2014-03-01: 20 meq
  Filled 2014-03-01: qty 1

## 2014-03-01 MED ORDER — TORSEMIDE 20 MG PO TABS
60.0000 mg | ORAL_TABLET | Freq: Every day | ORAL | Status: DC
  Administered 2014-03-01 – 2014-03-02 (×2): 60 mg via ORAL
  Filled 2014-03-01 (×2): qty 3

## 2014-03-01 MED ORDER — WARFARIN SODIUM 4 MG PO TABS
4.0000 mg | ORAL_TABLET | Freq: Once | ORAL | Status: AC
  Administered 2014-03-01: 4 mg via ORAL
  Filled 2014-03-01: qty 1

## 2014-03-01 NOTE — Progress Notes (Signed)
CARDIAC REHAB PHASE I   PRE:  Rate/Rhythm: 81 Pacing  BP:  Supine:   Sitting: 122/27  Standing:    SaO2: 96 1L 93 RA  MODE:  Ambulation: 900 ft   POST:  Rate/Rhythm: 96 PAcing  BP:  Supine:   Sitting: 131/35  Standing:    SaO2: 94 RA 1055-1130 Assisted X 1 to ambulate. Gait steady. Pt able to walk 900 feet without c/o of pain or SOB, VS stable Pt to recliner after walk with call light in reach and wife present.  Melina Copa RN 03/01/2014 11:29 AM

## 2014-03-01 NOTE — Progress Notes (Signed)
Patient ID: Timothy Durham, male   DOB: 1930/09/03, 80 y.o.   MRN: [id number] Advanced Heart Failure Rounding Note   Subjective:   Timothy Durham is an 80 year old with a history of  NICM with chronic systolic HF, LBBB, elective CRTP, non obstructive CAD, HTN, prior CVA, CKD, HLD, PAF and prior bioprosthetic AVR. He is on chronic coumadin therapy - checked by his PCP.   Admitted in October with Afib RVR and was diuresed and started on diltiazem. Echo 01/12/14 showed a drop in his AF to 30-35% with moderate RV dysfunction (previous echo in Dec 2014- 40-45%). He underwent RHC/LHC 01/14/14 and this showed moderate non obstructive CAD with a 50% CFX and mild pulmonary HTN.  RA 16/13 mean 12 mm Hg RV 42/11/16 mm Hg PA 42/23 mean 25 mm Hg PCWP 20/20 mean 17 mm Hg PA 61% AO 90% Cardiac Output (Fick) 4.53 L/min  Cardiac Index (Fick) 2.27 L/min/m2   He was discharged 01/16/14. His diuretics were decreased as an OP secondary to worsening renal function. He came back to the ER 02/09/14 with SOB. He was discharged 11/17 - weight 159  He had LBBB and Dr Timothy Durham placed CRT-P device on 11/23.   Readmitted with recurrent HF on 12/2. CXR with persistent effusions and atelectasis. He has had a 10 pound weight gain from his lowest weight 159>169 pounds. pBNP 18948  QRS duration was longer than pre CRT so device reprogrammed.  This morning, co-ox 64%.  CVP 8, creatinine 1.32 (mild increase).  He says that his breathing is better. He walked with PT yesterday without significant dyspnea, home health PT recommended.    Objective:   Weight Range:  Vital Signs:   Temp:  [97.5 F (36.4 C)-98.3 F (36.8 C)] 97.5 F (36.4 C) (12/05 0349) Pulse Rate:  [62-78] 67 (12/05 0349) Resp:  [14-37] 17 (12/05 0349) BP: (115-132)/(34-46) 115/42 mmHg (12/05 0349) SpO2:  [91 %-97 %] 92 % (12/05 0349) Weight:  [155 lb 10.3 oz (70.6 kg)] 155 lb 10.3 oz (70.6 kg) (12/05 0349) Last BM Date: 02/27/14  Weight change: Filed  Weights   02/27/14 0439 02/28/14 0405 03/01/14 0349  Weight: 159 lb 9.8 oz (72.4 kg) 155 lb 10.3 oz (70.6 kg) 155 lb 10.3 oz (70.6 kg)    Intake/Output:   Intake/Output Summary (Last 24 hours) at 03/01/14 0716 Last data filed at 03/01/14 0349  Gross per 24 hour  Intake    120 ml  Output   2120 ml  Net  -2000 ml     Physical Exam: General:  Chronically ill appearing. No resp difficulty. Wife present  HEENT: normal Neck: supple. JVP 7-8. Carotids 2+ bilat; no bruits. No lymphadenopathy or thryomegaly appreciated. Cor: PMI nondisplaced. Regular rate & rhythm. No rubs, gallops or murmurs. Lungs: clear Abdomen: soft, nontender, nondistended. + hepatomegaly. No bruits or masses. Good bowel sounds. Extremities: no cyanosis, clubbing, rash. No edema.  + PICC Neuro: alert & orientedx3, cranial nerves grossly intact. moves all 4 extremities w/o difficulty. Affect pleasant GU: Foley  Telemetry: SR, BiV pacing with frequent PVCs  Labs: Basic Metabolic Panel:  Recent Labs Lab 02/24/14 1606 02/26/14 1623 02/27/14 0245 02/28/14 0415 03/01/14 0355  NA 135 137 138 142 138  K 4.0 4.8 3.7 3.5* 3.3*  CL 98 96 97 99 95*  CO2 28 26 27 27 30   GLUCOSE 78 104* 87 83 96  BUN 27* 24* 23 25* 28*  CREATININE 1.3 1.19 1.17 1.23 1.32  CALCIUM 8.7  9.1 8.6 8.2* 8.5    Liver Function Tests: No results for input(s): AST, ALT, ALKPHOS, BILITOT, PROT, ALBUMIN in the last 168 hours. No results for input(s): LIPASE, AMYLASE in the last 168 hours. No results for input(s): AMMONIA in the last 168 hours.  CBC:  Recent Labs Lab 02/24/14 1606 02/26/14 1623 02/27/14 0245  WBC 6.8 8.7 6.2  NEUTROABS 5.3  --   --   HGB 13.1 14.0 11.8*  HCT 40.1 41.1 34.9*  MCV 102.2* 99.3 96.7  PLT 237.0 243 210    Cardiac Enzymes: No results for input(s): CKTOTAL, CKMB, CKMBINDEX, TROPONINI in the last 168 hours.  BNP: BNP (last 3 results)  Recent Labs  02/22/14 0100 02/24/14 1606 02/26/14 1623   PROBNP 19705.0* 2973.0* 18948.0*     Other results:  EKG: NSR with LV pacing 79  Imaging: Dg Chest Port 1 View  02/28/2014   CLINICAL DATA:  Pleural effusions. Congestive heart failure. Shortness of breath.  EXAM: PORTABLE CHEST - 1 VIEW  COMPARISON:  02/27/2014  FINDINGS: Right central line tip:  Lower SVC.  Pacer leads unchanged.  Atherosclerotic aortic arch.  Prior median sternotomy.  Band of density in the right mid lung likely represents fluid in the minor fissure. Increased interstitial accentuation and indistinctness of the pulmonary vasculature. Reduced conspicuity of the confluent interstitial opacity at the right lung base and reduced conspicuity of the right inferior pleural effusion. Continued retrocardiac opacity.  IMPRESSION: 1. Generally increased indistinctness of the pulmonary vasculature and increased interstitial accentuation suggesting interstitial edema, although the opacity at the right lung base appears slightly improved. 2. Fluid in the minor fissure, stable. 3. Left lower lobe airspace opacity is technically nonspecific although potentially from atelectasis and layering effusion.   Electronically Signed   By: Herbie BaltimoreWalt  Liebkemann M.D.   On: 02/28/2014 08:19   Dg Chest Port 1 View  02/27/2014   CLINICAL DATA:  Status post line placement.  EXAM: PORTABLE CHEST - 1 VIEW  COMPARISON:  February 26, 2014.  FINDINGS: Stable cardiomediastinal silhouette. Sternotomy wires are noted. Left-sided pacemaker is in grossly good position. No pneumothorax is noted. Mild right pleural effusion is noted which is stable. Fluid is noted in the right minor fissure as well. Interval placement of right-sided PICC line with distal tip overlying expected position of the SVC.  IMPRESSION: Interval placement of right-sided PICC line with distal tip overlying expected position of the SVC. Mild right pleural effusion is again noted.   Electronically Signed   By: Roque LiasJames  Green M.D.   On: 02/27/2014 19:39      Medications:     Scheduled Medications: . allopurinol  200 mg Oral Daily  . amiodarone  200 mg Oral BID  . antiseptic oral rinse  7 mL Mouth Rinse BID  . atorvastatin  40 mg Oral QPM  . carvedilol  3.125 mg Oral BID WC  . clopidogrel  75 mg Oral Daily  . DULoxetine  30 mg Oral Daily  . feeding supplement (ENSURE COMPLETE)  237 mL Oral Q1500  . feeding supplement (RESOURCE BREEZE)  1 Container Oral Q1500  . lisinopril  5 mg Oral Daily  . omega-3 acid ethyl esters  1 g Oral Daily  . pantoprazole  40 mg Oral Daily  . potassium chloride  40 mEq Oral Once  . sodium chloride  3 mL Intravenous Q12H  . spironolactone  12.5 mg Oral Daily  . torsemide  60 mg Oral Daily  . Warfarin - Pharmacist Dosing  Inpatient   Does not apply q1800    Infusions:    PRN Medications: sodium chloride, acetaminophen, albuterol, nitroGLYCERIN, ondansetron (ZOFRAN) IV, sodium chloride   Assessment:   1. A/C Systolic Heart Failure due to NICM EF 30-35% 2. PAF on chronic coumadin  3. CKD , Stage III 4. Mod-Severe Mitral Regurgitation  5. S/p Tissue AVR 2006 6. LBBB has BiV [employer] CRT-P Placed 02/17/14   7. Urinary Retention- foley placed 02/26/14   8. Bilateral pleural effusions 9. Frequent PVCs   Plan/Discussion:    Difficult case. Despite fairly decent numbers on RHC and placement of CRT-P device last month he continues to struggle with advanced HF symptoms and multiple admissions.   He has diuresed reasonably well, CVP now 8.  Not particularly volume overloaded on exam. I will repeat CXR today.  Co-ox is reasonable today though not as high as yesterday.  - Stop Lasix and metolazone, start torsemide 60 mg daily today. Follow UOP.  - Add spironolactone 12.5 mg daily. - Continue Coreg and lisinopril at current doses.   Moderate to severe MR.  May be candidate for percutaneous MV repair, will need to assess echo.   His PVC burden may be contributing to his symptoms. Reprogramming of CRT  device per Dr. Graciela Husbands. Per discussion yesterday with Dr Graciela Husbands, plan to add amiodarone 200 mg bid today for suppression of PVCs and atrial fibrillation.    Continue warfarin for afib.  He was started on Plavix apparently due to TIA despite therapeutic warfarin.  I will stop ASA as he does not need all 3.   PT following, recommend home PT.  Urinary retention: will add Flomax, will try to remove foley tomorrow after he has had a couple of doses.   Dalton McLean,MD 7:16 AM  Advanced Heart Failure Team Pager [phone] (M-F; 7a - 4p)  Please contact [facility] for night-coverage after hours (4p -7a ) and weekends on [email or web address]

## 2014-03-01 NOTE — Plan of Care (Signed)
Problem: Phase I Progression Outcomes Goal: Dyspnea controlled at rest (HF) Outcome: Completed/Met Date Met:  03/01/14 Goal: Pain controlled with appropriate interventions Outcome: Completed/Met Date Met:  03/01/14 Goal: EF % per last Echo/documented,Core Reminder form on chart Outcome: Progressing Goal: Up in chair, BRP Outcome: Completed/Met Date Met:  03/01/14 Goal: Initial discharge plan identified Outcome: Progressing Goal: Voiding-avoid urinary catheter unless indicated Outcome: Not Met (add Reason) Acute urinary retention

## 2014-03-01 NOTE — Progress Notes (Signed)
ANTICOAGULATION CONSULT NOTE - Follow Up Consult  Pharmacy Consult for Coumadin Indication: atrial fibrillation  Allergies  Allergen Reactions  . Ambien [Zolpidem] Other (See Comments)    Hallucinations  . Ativan [Lorazepam] Other (See Comments)    hallucinations  . Penicillins     Unknown childhood reaction   . Sulfa Antibiotics     unknown    Patient Measurements: Height: 5\' 11"  (180.3 cm) Weight: 155 lb 10.3 oz (70.6 kg) IBW/kg (Calculated) : 75.3 Heparin Dosing Weight:   Vital Signs: Temp: 97.6 F (36.4 C) (12/05 0742) Temp Source: Oral (12/05 0742) BP: 129/41 mmHg (12/05 0742) Pulse Rate: 76 (12/05 0742)  Labs:  Recent Labs  02/26/14 1623 02/27/14 0245 02/28/14 0415 03/01/14 0355 03/01/14 0835  HGB 14.0 11.8*  --   --   --   HCT 41.1 34.9*  --   --   --   PLT 243 210  --   --   --   LABPROT 34.9* 34.7* 28.6* 23.6*  --   INR 3.44* 3.41* 2.67* 2.09*  --   CREATININE 1.19 1.17 1.23 1.32 1.23    Estimated Creatinine Clearance: 47 mL/min (by C-G formula based on Cr of 1.23).  Assessment: Patient is an 80 y.o M s/p recent pacemaker (02/17/14) on coumadin PTA for afib and recurrent CVAs. Home coumadin regimen is 4mg  daily except 1mg  on Sundays.   He presented 12/2 to the ED with c/o SOB. Outpatient Cardiology note indicated that he reported nosebleed on the evening of 11/30 and early morning of 12/01 with an INR of 7.1 at his PCP office. He stated that he was instructed to hold his dose on 12/01 and 12/02 and to come back for repeat INR check on 12/3.INR (2.09) remains therapeutic but continues to trend down with held doses (12/1-12/3). Patient received Coumadin 4mg  last night - will repeat and follow-up AM INR. Noted Amiodarone started today (12/5) - will monitor for increased INR due to DDI. - No new CBC - No significant bleeding reported  Goal of Therapy:  INR 2-3   Plan:  1. Repeat Coumadin 4mg  PO x 1 today 2. Daily INR  Cleon Dew   [phone] 03/01/2014,9:50 AM

## 2014-03-02 LAB — CBC
HEMATOCRIT: 36.2 % — AB (ref 39.0–52.0)
Hemoglobin: 12.3 g/dL — ABNORMAL LOW (ref 13.0–17.0)
MCH: 33.8 pg (ref 26.0–34.0)
MCHC: 34 g/dL (ref 30.0–36.0)
MCV: 99.5 fL (ref 78.0–100.0)
Platelets: 220 10*3/uL (ref 150–400)
RBC: 3.64 MIL/uL — ABNORMAL LOW (ref 4.22–5.81)
RDW: 14.5 % (ref 11.5–15.5)
WBC: 8.9 10*3/uL (ref 4.0–10.5)

## 2014-03-02 LAB — BASIC METABOLIC PANEL
Anion gap: 16 — ABNORMAL HIGH (ref 5–15)
BUN: 34 mg/dL — ABNORMAL HIGH (ref 6–23)
CO2: 29 mEq/L (ref 19–32)
Calcium: 8.6 mg/dL (ref 8.4–10.5)
Chloride: 94 mEq/L — ABNORMAL LOW (ref 96–112)
Creatinine, Ser: 1.4 mg/dL — ABNORMAL HIGH (ref 0.50–1.35)
GFR calc Af Amer: 53 mL/min — ABNORMAL LOW (ref >90)
GFR calc non Af Amer: 46 mL/min — ABNORMAL LOW (ref >90)
Glucose, Bld: 109 mg/dL — ABNORMAL HIGH (ref 70–99)
Potassium: 4.3 mEq/L (ref 3.7–5.3)
Sodium: 139 mEq/L (ref 137–147)

## 2014-03-02 LAB — CARBOXYHEMOGLOBIN
CARBOXYHEMOGLOBIN: 1.6 % — AB (ref 0.5–1.5)
Methemoglobin: 0.7 % (ref 0.0–1.5)
O2 Saturation: 95 %
TOTAL HEMOGLOBIN: 12.5 g/dL — AB (ref 13.5–18.0)

## 2014-03-02 LAB — PROTIME-INR
INR: 1.97 — AB (ref 0.00–1.49)
PROTHROMBIN TIME: 22.6 s — AB (ref 11.6–15.2)

## 2014-03-02 MED ORDER — WARFARIN SODIUM 4 MG PO TABS
4.0000 mg | ORAL_TABLET | Freq: Once | ORAL | Status: AC
  Administered 2014-03-02: 4 mg via ORAL
  Filled 2014-03-02: qty 1

## 2014-03-02 MED ORDER — TORSEMIDE 20 MG PO TABS
40.0000 mg | ORAL_TABLET | Freq: Every day | ORAL | Status: DC
  Filled 2014-03-02: qty 2

## 2014-03-02 NOTE — Progress Notes (Signed)
Patient ID: Ohn Bostic, male   DOB: 1930-07-08, 80 y.o.   MRN: [id number]   Advanced Heart Failure Rounding Note   Subjective:   Mr. Rothman is a 80 year old with a history of  NICM with chronic systolic HF, LBBB, elective CRTP, non obstructive CAD, HTN, prior CVA, CKD, HLD, PAF and prior bioprosthetic AVR. He is on chronic coumadin therapy - checked by his PCP.   Admitted in October with Afib RVR and was diuresed and started on diltiazem. Echo 01/12/14 showed a drop in his AF to 30-35% with moderate RV dysfunction (previous echo in Dec 2014- 40-45%). He underwent RHC/LHC 01/14/14 and this showed moderate non obstructive CAD with a 50% CFX and mild pulmonary HTN.  RA 16/13 mean 12 mm Hg RV 42/11/16 mm Hg PA 42/23 mean 25 mm Hg PCWP 20/20 mean 17 mm Hg PA 61% AO 90% Cardiac Output (Fick) 4.53 L/min  Cardiac Index (Fick) 2.27 L/min/m2   He was discharged 01/16/14. His diuretics were decreased as an OP secondary to worsening renal function. He came back to the ER 02/09/14 with SOB. He was discharged 11/17 - weight 159  He had LBBB and Dr Ladona Ridgel placed CRT-P device on 11/23.   Readmitted with recurrent HF on 12/2. CXR with persistent effusions and atelectasis. He has had a 10 pound weight gain from his lowest weight 159>169 pounds. pBNP 18948  QRS duration was longer than pre CRT so device reprogrammed.  Switched to po torsemide yesterday. Weight down another 3 pounds. Breathing better. Renal function slightly worse. INR 1.97. Co-ox 95% (likely inaccurate). CVP 3   Objective:   Weight Range:  Vital Signs:   Temp:  [97.3 F (36.3 C)-98.7 F (37.1 C)] 97.5 F (36.4 C) (12/06 0815) Pulse Rate:  [50-98] 84 (12/06 0750) Resp:  [19-29] 24 (12/06 0750) BP: (113-131)/(34-52) 131/52 mmHg (12/06 0750) SpO2:  [92 %-98 %] 97 % (12/06 0750) Weight:  [69 kg (152 lb 1.9 oz)] 69 kg (152 lb 1.9 oz) (12/06 0341) Last BM Date: 03/01/14  Weight change: Filed Weights   02/28/14 0405 03/01/14  0349 03/02/14 0341  Weight: 70.6 kg (155 lb 10.3 oz) 70.6 kg (155 lb 10.3 oz) 69 kg (152 lb 1.9 oz)    Intake/Output:   Intake/Output Summary (Last 24 hours) at 03/02/14 1027 Last data filed at 03/01/14 2344  Gross per 24 hour  Intake      0 ml  Output   1875 ml  Net  -1875 ml     Physical Exam: General:  Elderly NAD HEENT: normal Neck: supple. JVP flat. Carotids 2+ bilat; no bruits. No lymphadenopathy or thryomegaly appreciated. Cor: PMI nondisplaced. Regular rate & rhythm. No rubs, gallops or murmurs. Lungs: clear Abdomen: soft, nontender, nondistended.  No bruits or masses. Good bowel sounds. Extremities: no cyanosis, clubbing, rash. No edema.  + PICC Neuro: alert & orientedx3, cranial nerves grossly intact. moves all 4 extremities w/o difficulty. Affect pleasant GU: Foley  Telemetry: SR, BiV pacing with PACs/PVCs  Labs: Basic Metabolic Panel:  Recent Labs Lab 02/27/14 0245 02/28/14 0415 03/01/14 0355 03/01/14 0835 03/02/14 0310  NA 138 142 138 139 139  K 3.7 3.5* 3.3* 3.4* 4.3  CL 97 99 95* 94* 94*  CO2 27 27 30 31 29   GLUCOSE 87 83 96 86 109*  BUN 23 25* 28* 27* 34*  CREATININE 1.17 1.23 1.32 1.23 1.40*  CALCIUM 8.6 8.2* 8.5 8.5 8.6    Liver Function Tests: No results for  input(s): AST, ALT, ALKPHOS, BILITOT, PROT, ALBUMIN in the last 168 hours. No results for input(s): LIPASE, AMYLASE in the last 168 hours. No results for input(s): AMMONIA in the last 168 hours.  CBC:  Recent Labs Lab 02/24/14 1606 02/26/14 1623 02/27/14 0245 03/02/14 0310  WBC 6.8 8.7 6.2 8.9  NEUTROABS 5.3  --   --   --   HGB 13.1 14.0 11.8* 12.3*  HCT 40.1 41.1 34.9* 36.2*  MCV 102.2* 99.3 96.7 99.5  PLT 237.0 243 210 220    Cardiac Enzymes: No results for input(s): CKTOTAL, CKMB, CKMBINDEX, TROPONINI in the last 168 hours.  BNP: BNP (last 3 results)  Recent Labs  02/22/14 0100 02/24/14 1606 02/26/14 1623  PROBNP 19705.0* 2973.0* 18948.0*     Other  results:  EKG: NSR with LV pacing 79  Imaging: Dg Chest 2 View  03/01/2014   CLINICAL DATA:  Shortness of breath for 2 months, productive cough. Previous aorta valvuloplasty  EXAM: CHEST  2 VIEW  COMPARISON:  02/28/2014  FINDINGS: Ovoid opacity over the right midlung zone is compatible with loculated pleural fluid in the minor fissure, unchanged. Bilateral trace pleural effusions are noted. Interstitial Kerley B-lines are reidentified. Mild enlargement of the cardiac silhouette with evidence of aorta valvuloplasty and median sternotomy reidentified. Right-sided PICC line in place with tip over the mid SVC. Three lead left-sided pacer in place. Hyperinflation suggests COPD.  IMPRESSION: Stable trace bilateral pleural effusions with evidence of interstitial pulmonary edema. No significant change allowing for differences in technique.   Electronically Signed   By: Christiana PellantGretchen  Green M.D.   On: 03/01/2014 11:01     Medications:     Scheduled Medications: . allopurinol  200 mg Oral Daily  . amiodarone  200 mg Oral BID  . antiseptic oral rinse  7 mL Mouth Rinse BID  . atorvastatin  40 mg Oral QPM  . carvedilol  3.125 mg Oral BID WC  . clopidogrel  75 mg Oral Daily  . DULoxetine  30 mg Oral Daily  . feeding supplement (ENSURE COMPLETE)  237 mL Oral Q1500  . feeding supplement (RESOURCE BREEZE)  1 Container Oral Q1500  . lisinopril  5 mg Oral Daily  . omega-3 acid ethyl esters  1 g Oral Daily  . pantoprazole  40 mg Oral Daily  . sodium chloride  3 mL Intravenous Q12H  . spironolactone  12.5 mg Oral Daily  . tamsulosin  0.4 mg Oral Daily  . torsemide  60 mg Oral Daily  . warfarin  4 mg Oral ONCE-1800  . Warfarin - Pharmacist Dosing Inpatient   Does not apply q1800    Infusions:    PRN Medications: sodium chloride, acetaminophen, albuterol, nitroGLYCERIN, ondansetron (ZOFRAN) IV, sodium chloride   Assessment:   1. A/C Systolic Heart Failure due to NICM EF 30-35% 2. PAF on chronic  coumadin  3. CKD , Stage III 4. Mod-Severe Mitral Regurgitation  5. S/p Tissue AVR 2006 6. LBBB has BiV [employer] CRT-P Placed 02/17/14   7. Urinary Retention- foley placed 02/26/14   8. Bilateral pleural effusions 9. Frequent PVCs   Plan/Discussion:    He is as dry as we are going to get him though CXR still with some fluid. Will decrease torsemide to 40 daily. Follow renal function.   Continue amiodarone 200 mg bid today for suppression of PVCs and atrial fibrillation.    Moderate to severe MR.  May be candidate for percutaneous MV repair, will need to assess echo.  Continue warfarin for afib.  He was started on Plavix apparently due to TIA despite therapeutic warfarin. ASA stopped.   PT following, recommend home PT.  Urinary retention: Flomax added, will remove foley and follow PVR.  Truman Hayward 10:27 AM  Advanced Heart Failure Team Pager [phone] (M-F; 7a - 4p)  Please contact [facility] for night-coverage after hours (4p -7a ) and weekends on [email or web address]

## 2014-03-02 NOTE — Progress Notes (Signed)
ANTICOAGULATION CONSULT NOTE - Follow Up Consult  Pharmacy Consult for Coumadin Indication: atrial fibrillation  Allergies  Allergen Reactions  . Ambien [Zolpidem] Other (See Comments)    Hallucinations  . Ativan [Lorazepam] Other (See Comments)    hallucinations  . Penicillins     Unknown childhood reaction   . Sulfa Antibiotics     unknown    Patient Measurements: Height: 5\' 11"  (180.3 cm) Weight: 152 lb 1.9 oz (69 kg) IBW/kg (Calculated) : 75.3 Heparin Dosing Weight:   Vital Signs: Temp: 97.5 F (36.4 C) (12/06 0815) Temp Source: Oral (12/06 0815) BP: 131/52 mmHg (12/06 0750) Pulse Rate: 84 (12/06 0750)  Labs:  Recent Labs  02/28/14 0415 03/01/14 0355 03/01/14 0835 03/02/14 0310  HGB  --   --   --  12.3*  HCT  --   --   --  36.2*  PLT  --   --   --  220  LABPROT 28.6* 23.6*  --  22.6*  INR 2.67* 2.09*  --  1.97*  CREATININE 1.23 1.32 1.23 1.40*    Estimated Creatinine Clearance: 40.4 mL/min (by C-G formula based on Cr of 1.4).  Assessment: Patient is an 80 y.o M s/p recent pacemaker (02/17/14) on coumadin PTA for afib and recurrent CVAs. Home coumadin regimen is 4mg  daily except 1mg  on Sundays.   He presented 12/2 to the ED with c/o SOB. Outpatient Cardiology note indicated that he reported nosebleed on the evening of 11/30 and early morning of 12/01 with an INR of 7.1 at his PCP office. He stated that he was instructed to hold his dose on 12/01 and 12/02. Coumadin was restarted on 12/4 and now INR (1.97) is just below goal level. INR decrease has slowed with Coumadin 4mg  x 2 - will repeat 4mg  tonight to hopefully get INR therapeutic but patient may need lower doses in future based on recent INRs and the addition of amiodarone.  - H/H and Plts improving - No significant bleeding reported  Goal of Therapy:  INR 2-3   Plan:  1. Repeat Coumadin 4mg  PO x 1 today 2. Daily INR  Cleon Dew  [phone] 03/02/2014,9:34 AM

## 2014-03-03 ENCOUNTER — Telehealth: Payer: Self-pay | Admitting: Nurse Practitioner

## 2014-03-03 ENCOUNTER — Other Ambulatory Visit: Payer: Self-pay | Admitting: Physician Assistant

## 2014-03-03 DIAGNOSIS — I5023 Acute on chronic systolic (congestive) heart failure: Secondary | ICD-10-CM

## 2014-03-03 LAB — CARBOXYHEMOGLOBIN
Carboxyhemoglobin: 1.3 % (ref 0.5–1.5)
METHEMOGLOBIN: 0.5 % (ref 0.0–1.5)
O2 Saturation: 89.3 %
Total hemoglobin: 12.9 g/dL — ABNORMAL LOW (ref 13.5–18.0)

## 2014-03-03 LAB — PROTIME-INR
INR: 2.01 — ABNORMAL HIGH (ref 0.00–1.49)
Prothrombin Time: 23 seconds — ABNORMAL HIGH (ref 11.6–15.2)

## 2014-03-03 MED ORDER — TORSEMIDE 20 MG PO TABS: 40.0000 mg | ORAL_TABLET | Freq: Every day | ORAL | Status: DC

## 2014-03-03 MED ORDER — TAMSULOSIN HCL 0.4 MG PO CAPS: 0.4000 mg | ORAL_CAPSULE | Freq: Every day | ORAL | Status: DC

## 2014-03-03 MED ORDER — AMIODARONE HCL 200 MG PO TABS: 200.0000 mg | ORAL_TABLET | Freq: Two times a day (BID) | ORAL | Status: DC

## 2014-03-03 MED ORDER — AMIODARONE HCL 200 MG PO TABS: 200.0000 mg | ORAL_TABLET | Freq: Every day | ORAL | Status: DC

## 2014-03-03 MED ORDER — CARVEDILOL 3.125 MG PO TABS: 3.1250 mg | ORAL_TABLET | Freq: Two times a day (BID) | ORAL | Status: DC

## 2014-03-03 MED ORDER — LISINOPRIL 5 MG PO TABS: 5.0000 mg | ORAL_TABLET | Freq: Every day | ORAL | Status: DC

## 2014-03-03 MED ORDER — SPIRONOLACTONE 12.5 MG HALF TABLET: 12.5000 mg | ORAL_TABLET | Freq: Every day | ORAL | Status: DC

## 2014-03-03 NOTE — Discharge Instructions (Signed)
Continue amiodarone 200mg  (1 tablet) two times daily for 8 more days. Then on 12/15//15 begin taking 200mg  (1 tablet) once a day.  Start Torsemide 40mg  po qd starting tomorrow 03/04/14

## 2014-03-03 NOTE — Plan of Care (Signed)
Problem: Phase II Progression Outcomes Goal: Pain controlled Outcome: Completed/Met Date Met:  03/03/14 Goal: Dyspnea controlled with activity Outcome: Completed/Met Date Met:  03/03/14 Goal: Walk in hall or up in chair TID Outcome: Completed/Met Date Met:  03/03/14 Goal: Discharge plan established Outcome: Completed/Met Date Met:  03/03/14 Goal: Begin discharge teaching Outcome: Completed/Met Date Met:  03/03/14  Problem: Phase III Progression Outcomes Goal: Pain controlled on oral analgesia Outcome: Completed/Met Date Met:  03/03/14 Goal: Activity at appropriate level-compared to baseline (UP IN CHAIR FOR HEMODIALYSIS)  Outcome: Completed/Met Date Met:  03/03/14 Goal: Tolerating diet Outcome: Completed/Met Date Met:  03/03/14 Goal: Dyspnea controlled with activity Outcome: Completed/Met Date Met:  03/03/14 Goal: Discharge plan remains appropriate-arrangements made Outcome: Completed/Met Date Met:  03/03/14 Goal: Fluid volume status improved Outcome: Completed/Met Date Met:  03/03/14

## 2014-03-03 NOTE — Progress Notes (Signed)
[id number] Cardiac Rehab Completed CHF education with pt and wife. We discussed CHF zones and I gave them CHF packet. We discussed sodium and fluid restrictions, daily weights, when to call MD and 911. They voice understanding. Pt's wife is very frustrated and feels that she is doing all she can to keep him out of the hospital. I gave them encouragement and we discussed CHF clinic follow up. I placed CHF video on for them to watch. Beatrix Fetters, RN 03/03/2014 11:27 AM

## 2014-03-03 NOTE — Discharge Summary (Signed)
Discharge Summary   Patient ID: Timothy Durham MRN: [id number], DOB/AGE: Jul 27, 1930 80 y.o. Admit date: 02/26/2014 D/C date:     03/03/2014  Primary Cardiologist: Dr Antoine PocheHochrein/ Dr. Ladona Ridgelaylor  Principal Problem:   Acute on chronic systolic congestive heart failure Active Problems:   TIA 2007, Dec 2014 (Plavix added)   Hypertension   Coronary artery disease-moderate Oct 2015   CKD (chronic kidney disease), stage III   Moderate to Severe Mitral Regurgitation   PAD- LLE PTA, RCEA, attempted LICA stent   Tissue AVR 2006 (in [place])   NICM (nonischemic cardiomyopathy)   PAF   Hyperlipidemia   LBBB (left bundle branch block)   History of CVA- Rt brain 2000   Chronic anticoagulation   Pacemaker- MDT BiV 02/17/14   Acute on chronic systolic CHF (congestive heart failure)   Bladder outlet obstruction   Protein-calorie malnutrition, severe    Admission Dates: 02/26/14- 03/03/14 Discharge Diagnosis: acute on chronic CHF. Discharge weight 152 lbs.   HPI: [name] is an 80 year old with a history of NICM with chronic systolic CHF, LBBB, elective CRTP (02/17/14), non obstructive CAD, HTN, prior CVA, CKD, HLD, PAF and prior bioprosthetic AVR who [facility]14 with acute on chronic CHF.    He was admitted in October with Afib RVR and was diuresed and started on diltiazem. Echo 01/12/14 showed a drop in his EF to 30-35% with moderate RV dysfunction (previous echo in Dec 2014- 40-45%). He underwent RHC/LHC 01/14/14 and this showed moderate non obstructive CAD with a 50% CFX and mild pulmonary HTN.  RA 16/13 mean 12 mm Hg RV 42/11/16 mm Hg PA 42/23 mean 25 mm Hg PCWP 20/20 mean 17 mm Hg PA 61% AO 90% Cardiac Output (Fick) 4.53 L/min  Cardiac Index (Fick) 2.27 L/min/m2  He was discharged 01/16/14. His diuretics were decreased as an OP secondary to worsening renal function. He came back to the ER 02/09/14 with SOB. He was discharged 11/17 - weight 159 He had LBBB and Dr Ladona Ridgelaylor placed  CRT-P device on 02/17/14.   Hospital Course  A/C Systolic Heart Failure due to NICM EF 30-35% -- He was readmitted with recurrent CHF on 12/2. CXR with persistent effusions and atelectasis. He has had a 10 pound weight gain from his lowest weight 159>169 pounds. pBNP 18948 QRS duration was longer than pre CRT so device reprogrammed. -- Switched to PO torsemide on 03/01/14: Net neg 8.4L. Weight 161--> 152lbs. -- CXR today still with some fluid; however, felt to be stable for discharge and dry as we can get him. BUN/creatinine up a bit again. Hold torsemide today, restart torsemide 40 mg daily tomorrow.  -- Continue BB, ACE and aldactone  -- Continue Na and fluid restrictions  PAF- on chronic coumadin  -- Continue amiodarone 200 mg bid today for suppression of PVCs and atrial fibrillation. After 10 days, will decrease to 200 mg daily. -- INR was recently supratheraputic ~7 and coumadin held. Coumadin restarted 12/4 and INR now therapeutic at 2.01. Will resume home coumadin regimen of 4mg  po qd (1 mg sundays) and have arranged a coumadin and PCP follow up this Wednesday  -- He was started on Plavix apparently due to TIA despite therapeutic warfarin. ASA stopped.  (Goal INR 2-2.5 given Plavix use)  CKD , Stage III- creat 1.4 today  -- CMET thursday  Mod-Severe Mitral Regurgitation-  May be candidate for percutaneous MV repair, will need to assess echo   S/p Tissue AVR  2006  LBBB has BiV Medtronic CRT-P placed 02/17/14   Urinary Retention- foley placed 02/26/14  -- Flomax added, foley now out and voiding without problems.   Bilateral pleural effusions  Frequent PVCs-  Continue amdiodarone  Dispo- Home today with home health and PT.    The patient has had an uncomplicated hospital course and is recovering well. He has been seen by Dr. Shirlee Latch today and deemed ready for discharge home. All follow-up appointments have been scheduled. Discharge medications are listed below and  include amiodarone 200 mg bid x 8 days more then 200 mg daily, allopurinol, atorvastatin 40 daily, Coreg 3.125 bid, Plavix 75 mg daily, lisinopril 5 mg daily, warfarin (goal INR 2-2.5 given Plavix use), spironolactone 12.5 daily, Flomax 0.4 daily, torsemide 40 mg daily (starting tomorrow)   Discharge Vitals: Blood pressure 147/70, pulse 97, temperature 97.8 F (36.6 C), temperature source Oral, resp. rate 22, height 5\' 11"  (1.803 m), weight 152 lb 5.4 oz (69.1 kg), SpO2 95 %.  Labs: Lab Results  Component Value Date   WBC 8.9 03/02/2014   HGB 12.3* 03/02/2014   HCT 36.2* 03/02/2014   MCV 99.5 03/02/2014   PLT 220 03/02/2014     Recent Labs Lab 03/02/14 0310  NA 139  K 4.3  CL 94*  CO2 29  BUN 34*  CREATININE 1.40*  CALCIUM 8.6  GLUCOSE 109*     Diagnostic Studies/Procedures   Dg Chest 2 View  03/01/2014   CLINICAL DATA:  Shortness of breath for 2 months, productive cough. Previous aorta valvuloplasty  EXAM: CHEST  2 VIEW  COMPARISON:  02/28/2014  FINDINGS: Ovoid opacity over the right midlung zone is compatible with loculated pleural fluid in the minor fissure, unchanged. Bilateral trace pleural effusions are noted. Interstitial Kerley B-lines are reidentified. Mild enlargement of the cardiac silhouette with evidence of aorta valvuloplasty and median sternotomy reidentified. Right-sided PICC line in place with tip over the mid SVC. Three lead left-sided pacer in place. Hyperinflation suggests COPD.  IMPRESSION: Stable trace bilateral pleural effusions with evidence of interstitial pulmonary edema. No significant change allowing for differences in technique.   Electronically Signed   By: Christiana Pellant M.D.   On: 03/01/2014 11:01   Dg Chest 2 View  02/26/2014   CLINICAL DATA:  Shortness of breath for 2 months.  EXAM: CHEST  2 VIEW  COMPARISON:  02/24/2014  FINDINGS: The pacer wires are stable. The cardiac silhouette, mediastinal and hilar contours are unchanged. There are  persistent bilateral pleural effusions and bibasilar atelectasis. Persistent loculated fluid in the right major fissure. No pneumothorax.  IMPRESSION: Persistent effusions and atelectasis.   Electronically Signed   By: Loralie Champagne M.D.   On: 02/26/2014 17:06   Dg Chest 2 View  02/24/2014   CLINICAL DATA:  Recent pacemaker insertion ; continued cough and weakness and shortness of breath; history of pleural effusions  EXAM: CHEST  2 VIEW  COMPARISON:  PA and lateral chest of February 21, 2014.  FINDINGS: The lungs are adequately inflated. There are bilateral pleural effusions unchanged from the previous study. Fluid remains in the minor fissure. There is no pneumothorax. Right basilar atelectasis or infiltrate persists peer The heart and pulmonary vascularity are normal. There are 7 intact sternal wires. The permanent pacemaker leads are in reasonable position radiographically. The bony thorax is unremarkable.  IMPRESSION: The small to moderate-sized bilateral pleural effusions are stable. No significant interval change in the appearance of the chest since study of 3 days  ago.   Electronically Signed   By: David  [place]   On: 02/24/2014 16:54   Dg Chest 2 View  02/21/2014   CLINICAL DATA:  Shortness of Breath  EXAM: CHEST  2 VIEW  COMPARISON:  02/18/2014  FINDINGS: Cardiomediastinal silhouette is stable. Three leads cardiac pacemaker is unchanged in position. Bilateral small pleural effusion right greater than left. There is right basilar atelectasis or infiltrate. Persistent loculated pleural fluid in right minor fissure. Central mild vascular congestion without convincing pulmonary edema.  IMPRESSION: Bilateral small pleural effusion right greater than left. Right basilar atelectasis or infiltrate. Persistent loculated fluid in right minor fissure. No convincing pulmonary edema. Three leads cardiac pacemaker in place.   Electronically Signed   By: Natasha Mead M.D.   On: 02/21/2014 22:20   Dg Chest 2  View  02/18/2014   CLINICAL DATA:  80 year old male status post placement of cardiac rhythm maintenance device  EXAM: CHEST  2 VIEW  COMPARISON:  Preoperative chest x-ray 02/09/2014  FINDINGS: Interval placement of a left subclavian approach biventricular cardiac rhythm maintenance device. Leads project over the right atrium, right ventricle and within a cardiac vein overlying the left ventricle. Patient is status post median sternotomy with evidence of prior aortic valve replacement. Cardiac and mediastinal contours remain unchanged. Atherosclerotic calcification is present within the transverse aorta. No evidence of a pneumothorax or enlarging hemothorax. There are persistent right larger than left pleural effusions with fluid trapped in the minor fissure on the right. Interstitial pulmonary edema has slightly improved compared to the prior. Persistent but improved bibasilar atelectasis. Inspiratory volumes overall are slightly improved. No acute osseous abnormality.  IMPRESSION: 1. Slightly decreased pulmonary interstitial edema following placement of a biventricular cardiac rhythm maintenance device. 2. No evidence of pneumothorax or other complicating feature. 3. Persistent right larger than left pleural effusions with pleural fluid loculated in the minor fissure. 4. Persistent but improving bibasilar atelectasis.   Electronically Signed   By: Malachy Moan M.D.   On: 02/18/2014 07:49   Dg Chest 2 View  02/09/2014   CLINICAL DATA:  Shortness of breath  EXAM: CHEST  2 VIEW  COMPARISON:  01/12/2014  FINDINGS: Postsurgical changes are again seen. The cardiac shadow is stable. Small bilateral pleural effusions are noted as well as fluid trapped within the minor fissure on the right. Mild vascular congestion with interstitial edema is noted. The overall appearance of the congestion is improved when compare with the prior exam however.  IMPRESSION: CHF with small bilateral pleural effusions and fluid  trapped within the minor fissure on the right.   Electronically Signed   By: Alcide Clever M.D.   On: 02/09/2014 17:48   Dg Chest Port 1 View  02/28/2014   CLINICAL DATA:  Pleural effusions. Congestive heart failure. Shortness of breath.  EXAM: PORTABLE CHEST - 1 VIEW  COMPARISON:  02/27/2014  FINDINGS: Right central line tip:  Lower SVC.  Pacer leads unchanged.  Atherosclerotic aortic arch.  Prior median sternotomy.  Band of density in the right mid lung likely represents fluid in the minor fissure. Increased interstitial accentuation and indistinctness of the pulmonary vasculature. Reduced conspicuity of the confluent interstitial opacity at the right lung base and reduced conspicuity of the right inferior pleural effusion. Continued retrocardiac opacity.  IMPRESSION: 1. Generally increased indistinctness of the pulmonary vasculature and increased interstitial accentuation suggesting interstitial edema, although the opacity at the right lung base appears slightly improved. 2. Fluid in the minor fissure, stable. 3. Left  lower lobe airspace opacity is technically nonspecific although potentially from atelectasis and layering effusion.   Electronically Signed   By: Herbie Baltimore M.D.   On: 02/28/2014 08:19   Dg Chest Port 1 View  02/27/2014   CLINICAL DATA:  Status post line placement.  EXAM: PORTABLE CHEST - 1 VIEW  COMPARISON:  February 26, 2014.  FINDINGS: Stable cardiomediastinal silhouette. Sternotomy wires are noted. Left-sided pacemaker is in grossly good position. No pneumothorax is noted. Mild right pleural effusion is noted which is stable. Fluid is noted in the right minor fissure as well. Interval placement of right-sided PICC line with distal tip overlying expected position of the SVC.  IMPRESSION: Interval placement of right-sided PICC line with distal tip overlying expected position of the SVC. Mild right pleural effusion is again noted.   Electronically Signed   By: Roque Lias M.D.   On:  02/27/2014 19:39    Discharge Medications     Medication List    STOP taking these medications        furosemide 80 MG tablet  Commonly known as:  LASIX     metoprolol tartrate 25 MG tablet  Commonly known as:  LOPRESSOR      TAKE these medications        acetaminophen 500 MG tablet  Commonly known as:  TYLENOL  Take 1,000 mg by mouth every 6 (six) hours as needed for mild pain or headache.     albuterol 108 (90 BASE) MCG/ACT inhaler  Commonly known as:  PROVENTIL HFA;VENTOLIN HFA  Inhale 2 puffs into the lungs every 6 (six) hours as needed for wheezing or shortness of breath.     alendronate 70 MG tablet  Commonly known as:  FOSAMAX  Take 70 mg by mouth once a week. sunday     allopurinol 100 MG tablet  Commonly known as:  ZYLOPRIM  Take 200 mg by mouth daily.     amiodarone 200 MG tablet  Commonly known as:  PACERONE  Take 1 tablet (200 mg total) by mouth 2 (two) times daily.     amiodarone 200 MG tablet  Commonly known as:  PACERONE  Take 1 tablet (200 mg total) by mouth daily. On 03/11/14 start taking 1 tablet (200mg ) once daily.  Start taking on:  03/11/2014     atorvastatin 40 MG tablet  Commonly known as:  LIPITOR  Take 40 mg by mouth every evening.     azithromycin 250 MG tablet  Commonly known as:  ZITHROMAX Z-PAK  Take 2 pills today, then one daily until completed.     CALCIUM 600 + D PO  Take 2 tablets by mouth every morning.     carvedilol 3.125 MG tablet  Commonly known as:  COREG  Take 1 tablet (3.125 mg total) by mouth 2 (two) times daily with a meal.     clopidogrel 75 MG tablet  Commonly known as:  PLAVIX  Take 75 mg by mouth daily.     DULoxetine 30 MG capsule  Commonly known as:  CYMBALTA  Take 30 mg by mouth daily.     Fish Oil 1200 MG Caps  Take 1 capsule by mouth every morning.     lisinopril 5 MG tablet  Commonly known as:  PRINIVIL,ZESTRIL  Take 1 tablet (5 mg total) by mouth daily.     omeprazole 40 MG capsule    Commonly known as:  PRILOSEC  Take 40 mg by mouth every morning.  spironolactone 12.5 mg Tabs tablet  Commonly known as:  ALDACTONE  Take 0.5 tablets (12.5 mg total) by mouth daily.     tamsulosin 0.4 MG Caps capsule  Commonly known as:  FLOMAX  Take 1 capsule (0.4 mg total) by mouth daily.     torsemide 20 MG tablet  Commonly known as:  DEMADEX  Take 2 tablets (40 mg total) by mouth daily.  Start taking on:  03/04/2014     warfarin 1 MG tablet  Commonly known as:  COUMADIN  Take 1 mg by mouth once a week. On Sunday     warfarin 4 MG tablet  Commonly known as:  COUMADIN  Take 4 mg by mouth See admin instructions. Monday-Saturday        Disposition   The patient will be discharged in stable condition to home.  Follow-up Information    Follow up with [facility] On 03/10/2014.   Specialty:  Cardiology   Why:  @ 9:40 am; Gate Code 0006; Please bring all your medications to your visit.    Contact information:   [address] [id number] mc [place] [address] [phone]      Follow up with [facility] On 03/06/2014.   Why:  for lab work. The office will call you to make an appointment time.   Contact information:   [address] [place] [address] [phone]      Follow up with Dennis Bast, MD On 03/05/2014.   Specialty:  Internal Medicine   Why:  @ 10:45am    Contact information:   [address] [place] [address] [phone]       Follow up with [facility].   Why:  HH- RN/PT arranged   Contact information:   [address] [place] [address] [phone]         Duration of Discharge Encounter: Greater than 30 minutes including physician and PA time.  Byrd Hesselbach R PA-C 03/03/2014, 11:25 AM

## 2014-03-03 NOTE — Care Management Note (Addendum)
    Page 1 of 1   03/03/2014     1:50:41 PM CARE MANAGEMENT NOTE 03/03/2014  Patient:  Timothy Durham, Timothy Durham   Account Number:  [id number]  Date Initiated:  03/03/2014  Documentation initiated by:  Donn Pierini  Subjective/Objective Assessment:   Pt admitted acute on chronic CHF     Action/Plan:   PTA pt lived at home with spouse- PT/OT evals   Anticipated DC Date:  03/03/2014   Anticipated DC Plan:  HOME W HOME HEALTH SERVICES      DC Planning Services  CM consult      [facility] Choice  HOME HEALTH   Choice offered to / List presented to:  C-1 Patient        HH arranged  HH-2 PT  HH-1 RN  HH-10 DISEASE MANAGEMENT      HH agency  [facility]   Status of service:  Completed, signed off Medicare Important Message given?  YES (If response is "NO", the following Medicare IM given date fields will be blank) Date Medicare IM given:  03/03/2014 Medicare IM given by:  Donn Pierini Date Additional Medicare IM given:   Additional Medicare IM given by:    Discharge Disposition:  HOME W HOME HEALTH SERVICES  Per UR Regulation:  Reviewed for med. necessity/level of care/duration of stay  If discussed at Long Length of Stay Meetings, dates discussed:    Comments:  03/03/14- 1130- Donn Pierini RN, BSN [phone] Pt for d/c home today with [facility]- order for RN-HH- HF and PT spoke with pt and wife at bedside- who is agreeable to services- per pt choice she would like to use [facility] for [facility] services- referral called to  Debbie with [facility] for [facility] RN for CHF and PT services to begin within 24-48 hr post discharge.

## 2014-03-03 NOTE — Progress Notes (Addendum)
Patient ID: Timothy Durham, male   DOB: 1930-08-05, 80 y.o.   MRN: [id number]  Advanced Heart Failure Rounding Note   Subjective:   Timothy Durham is an 80 year old with a history of  NICM with chronic systolic HF, LBBB, elective CRTP, non obstructive CAD, HTN, prior CVA, CKD, HLD, PAF and prior bioprosthetic AVR. He is on chronic coumadin therapy - checked by his PCP.   Admitted in October with Afib RVR and was diuresed and started on diltiazem. Echo 01/12/14 showed a drop in his AF to 30-35% with moderate RV dysfunction (previous echo in Dec 2014- 40-45%). He underwent RHC/LHC 01/14/14 and this showed moderate non obstructive CAD with a 50% CFX and mild pulmonary HTN.  RA 16/13 mean 12 mm Hg RV 42/11/16 mm Hg PA 42/23 mean 25 mm Hg PCWP 20/20 mean 17 mm Hg PA 61% AO 90% Cardiac Output (Fick) 4.53 L/min  Cardiac Index (Fick) 2.27 L/min/m2  He was discharged 01/16/14. His diuretics were decreased as an OP secondary to worsening renal function. He came back to the ER 02/09/14 with SOB. He was discharged 11/17 - weight 159  He had LBBB and Dr Ladona Ridgel placed CRT-P device on 11/23.   Readmitted with recurrent HF on 12/2. CXR with persistent effusions and atelectasis. He has had a 10 pound weight gain from his lowest weight 159>169 pounds. pBNP 18948  QRS duration was longer than pre CRT so device reprogrammed.  Switched to po torsemide Saturday. Weight stable today. Breathing better. Renal function slightly worse. INR 2. Co-ox 89% (likely inaccurate). CVP 7.  Foley out yesterday and voiding without problems.    Objective:   Weight Range:  Vital Signs:   Temp:  [97.4 F (36.3 C)-98.1 F (36.7 C)] 97.8 F (36.6 C) (12/07 0736) Pulse Rate:  [72-97] 97 (12/07 0736) Resp:  [19-27] 22 (12/07 0736) BP: (98-147)/(24-70) 147/70 mmHg (12/07 0736) SpO2:  [92 %-98 %] 95 % (12/07 0736) Weight:  [152 lb 5.4 oz (69.1 kg)] 152 lb 5.4 oz (69.1 kg) (12/07 0500) Last BM Date: 03/01/14  Weight  change: Filed Weights   03/01/14 0349 03/02/14 0341 03/03/14 0500  Weight: 155 lb 10.3 oz (70.6 kg) 152 lb 1.9 oz (69 kg) 152 lb 5.4 oz (69.1 kg)    Intake/Output:   Intake/Output Summary (Last 24 hours) at 03/03/14 0807 Last data filed at 03/03/14 0328  Gross per 24 hour  Intake    240 ml  Output   1550 ml  Net  -1310 ml     Physical Exam: General:  Elderly NAD HEENT: normal Neck: supple. JVP flat. Carotids 2+ bilat; no bruits. No lymphadenopathy or thryomegaly appreciated. Cor: PMI nondisplaced. Regular rate & rhythm. No rubs, gallops or murmurs. Lungs: clear Abdomen: soft, nontender, nondistended.  No bruits or masses. Good bowel sounds. Extremities: no cyanosis, clubbing, rash. No edema.  + PICC Neuro: alert & orientedx3, cranial nerves grossly intact. moves all 4 extremities w/o difficulty. Affect pleasant  Telemetry: SR, BiV pacing with PACs/PVCs  Labs: Basic Metabolic Panel:  Recent Labs Lab 02/27/14 0245 02/28/14 0415 03/01/14 0355 03/01/14 0835 03/02/14 0310  NA 138 142 138 139 139  K 3.7 3.5* 3.3* 3.4* 4.3  CL 97 99 95* 94* 94*  CO2 27 27 30 31 29   GLUCOSE 87 83 96 86 109*  BUN 23 25* 28* 27* 34*  CREATININE 1.17 1.23 1.32 1.23 1.40*  CALCIUM 8.6 8.2* 8.5 8.5 8.6    Liver Function Tests: No results  for input(s): AST, ALT, ALKPHOS, BILITOT, PROT, ALBUMIN in the last 168 hours. No results for input(s): LIPASE, AMYLASE in the last 168 hours. No results for input(s): AMMONIA in the last 168 hours.  CBC:  Recent Labs Lab 02/24/14 1606 02/26/14 1623 02/27/14 0245 03/02/14 0310  WBC 6.8 8.7 6.2 8.9  NEUTROABS 5.3  --   --   --   HGB 13.1 14.0 11.8* 12.3*  HCT 40.1 41.1 34.9* 36.2*  MCV 102.2* 99.3 96.7 99.5  PLT 237.0 243 210 220    Cardiac Enzymes: No results for input(s): CKTOTAL, CKMB, CKMBINDEX, TROPONINI in the last 168 hours.  BNP: BNP (last 3 results)  Recent Labs  02/22/14 0100 02/24/14 1606 02/26/14 1623  PROBNP 19705.0*  2973.0* 18948.0*     Other results:  EKG: NSR with LV pacing 79  Imaging: Dg Chest 2 View  03/01/2014   CLINICAL DATA:  Shortness of breath for 2 months, productive cough. Previous aorta valvuloplasty  EXAM: CHEST  2 VIEW  COMPARISON:  02/28/2014  FINDINGS: Ovoid opacity over the right midlung zone is compatible with loculated pleural fluid in the minor fissure, unchanged. Bilateral trace pleural effusions are noted. Interstitial Kerley B-lines are reidentified. Mild enlargement of the cardiac silhouette with evidence of aorta valvuloplasty and median sternotomy reidentified. Right-sided PICC line in place with tip over the mid SVC. Three lead left-sided pacer in place. Hyperinflation suggests COPD.  IMPRESSION: Stable trace bilateral pleural effusions with evidence of interstitial pulmonary edema. No significant change allowing for differences in technique.   Electronically Signed   By: Christiana Pellant M.D.   On: 03/01/2014 11:01     Medications:     Scheduled Medications: . allopurinol  200 mg Oral Daily  . amiodarone  200 mg Oral BID  . atorvastatin  40 mg Oral QPM  . carvedilol  3.125 mg Oral BID WC  . clopidogrel  75 mg Oral Daily  . DULoxetine  30 mg Oral Daily  . feeding supplement (ENSURE COMPLETE)  237 mL Oral Q1500  . feeding supplement (RESOURCE BREEZE)  1 Container Oral Q1500  . lisinopril  5 mg Oral Daily  . omega-3 acid ethyl esters  1 g Oral Daily  . pantoprazole  40 mg Oral Daily  . sodium chloride  3 mL Intravenous Q12H  . spironolactone  12.5 mg Oral Daily  . tamsulosin  0.4 mg Oral Daily  . torsemide  40 mg Oral Daily  . Warfarin - Pharmacist Dosing Inpatient   Does not apply q1800    Infusions:    PRN Medications: sodium chloride, acetaminophen, albuterol, nitroGLYCERIN, ondansetron (ZOFRAN) IV, sodium chloride   Assessment:   1. A/C Systolic Heart Failure due to NICM EF 30-35% 2. PAF on chronic coumadin  3. CKD , Stage III 4. Mod-Severe Mitral  Regurgitation  5. S/p Tissue AVR 2006 6. LBBB has BiV [employer] CRT-P Placed 02/17/14   7. Urinary Retention- foley placed 02/26/14   8. Bilateral pleural effusions 9. Frequent PVCs   Plan/Discussion:    He is as dry as we are going to get him though CXR still with some fluid. BUN/creatinine up a bit again. Hold torsemide today, restart torsemide 40 mg daily tomorrow.   Continue amiodarone 200 mg bid today for suppression of PVCs and atrial fibrillation.  After 10 days, will decrease to 200 mg daily.     Moderate to severe MR.  May be candidate for percutaneous MV repair, will need to assess echo.  Continue warfarin for afib.  He was started on Plavix apparently due to TIA despite therapeutic warfarin. ASA stopped.   PT following, recommend home PT.  Urinary retention: Flomax added, foley now out and voiding without problems.   I think that he can go home today with home health and PT.  He will need followup early next week in CHF clinic.  He will need CMET on Thursday.  He needs coumadin clinic followup.  Discharge medications: Amiodarone 200 mg bid x 8 days more then 200 mg daily, allopurinol, atorvastatin 40 daily, Coreg 3.125 bid, Plavix 75 mg daily, lisinopril 5 mg daily, warfarin (goal INR 2-2.5 given Plavix use), spironolactone 12.5 daily, Flomax 0.4 daily, torsemide 40 mg daily.   Jayni Prescher,MD 8:07 AM  Advanced Heart Failure Team Pager [phone] (M-F; 7a - 4p)  Please contact [facility] for night-coverage after hours (4p -7a ) and weekends on [email or web address]

## 2014-03-03 NOTE — Progress Notes (Signed)
Medicare Important Message given? YES  (If response is "NO", the following Medicare IM given date fields will be blank)  Date Medicare IM given: 03/03/14 Medicare IM given by:  Katalena Malveaux  

## 2014-03-03 NOTE — Telephone Encounter (Signed)
New Msg  Timothy Durham returning call and would like to be contacted at [phone].

## 2014-03-03 NOTE — Progress Notes (Signed)
Discussed discharge instructions and medications with patient and wife. Both verbalized understanding with all questions answered. VSS. Pt discharged home with wife  Sherrice Creekmore,RN  

## 2014-03-03 NOTE — Plan of Care (Signed)
Problem: Phase I Progression Outcomes Goal: Voiding-avoid urinary catheter unless indicated Outcome: Completed/Met Date Met:  03/03/14 Goal: Hemodynamically stable Outcome: Completed/Met Date Met:  03/03/14

## 2014-03-03 NOTE — Plan of Care (Signed)
Problem: Phase I Progression Outcomes Goal: Initial discharge plan identified Outcome: Completed/Met Date Met:  03/03/14  Problem: Phase II Progression Outcomes Goal: Tolerating diet Outcome: Completed/Met Date Met:  03/03/14 Goal: Fluid volume status improved Outcome: Completed/Met Date Met:  03/03/14 Goal: Case manager referral Outcome: Completed/Met Date Met:  03/03/14

## 2014-03-04 ENCOUNTER — Telehealth: Payer: Self-pay | Admitting: Cardiology

## 2014-03-04 ENCOUNTER — Other Ambulatory Visit: Payer: Self-pay | Admitting: *Deleted

## 2014-03-04 MED ORDER — SPIRONOLACTONE 25 MG PO TABS: 12.5000 mg | ORAL_TABLET | Freq: Every day | ORAL | Status: DC

## 2014-03-04 MED ORDER — SPIRONOLACTONE 12.5 MG HALF TABLET: 12.5000 mg | ORAL_TABLET | Freq: Every day | ORAL | Status: DC

## 2014-03-04 NOTE — Telephone Encounter (Signed)
Script sent into the pharmacy. Unable to reach pt or leave a message

## 2014-03-04 NOTE — Telephone Encounter (Signed)
Pt need a new prescriprtion for Spironolactone 12.5 mg #90. Please call this in today to [phone].Pt also need to know if he should take this medicine in the morning or evening.

## 2014-03-05 ENCOUNTER — Telehealth (HOSPITAL_COMMUNITY): Payer: Self-pay | Admitting: *Deleted

## 2014-03-05 NOTE — Telephone Encounter (Signed)
Pt's wife called concerned about pt, she states he just had a presyncopal episode, she states he was sitting in chair reading a book and got up and walked into other room where he just leaned against her and seemed to be out of it for a second or 2, he states his vision went very blurry and dark for a sec, she lowered him to chair and checked his BP which was 107, she states this is pretty normal for pt.  He denies dizziness and states he is feeling ok now, his wt is down 1 lb since d/c and he did start taking Torsemide yesterday, appt sch for 12/10 with Tonye Becket, NP

## 2014-03-06 ENCOUNTER — Telehealth (HOSPITAL_COMMUNITY): Payer: Self-pay

## 2014-03-06 ENCOUNTER — Encounter (HOSPITAL_COMMUNITY): Payer: Self-pay

## 2014-03-06 ENCOUNTER — Ambulatory Visit (HOSPITAL_COMMUNITY)
Admission: RE | Admit: 2014-03-06 | Discharge: 2014-03-06 | Disposition: A | Discharge status: Home / Self Care | Payer: Medicare HMO | Source: Ambulatory Visit | Attending: Internal Medicine | Admitting: Internal Medicine

## 2014-03-06 VITALS — BP 102/41 | HR 75 | Resp 18 | Wt 156.4 lb

## 2014-03-06 DIAGNOSIS — I5022 Chronic systolic (congestive) heart failure: Secondary | ICD-10-CM

## 2014-03-06 DIAGNOSIS — Z953 Presence of xenogenic heart valve: Secondary | ICD-10-CM | POA: Diagnosis not present

## 2014-03-06 DIAGNOSIS — Z7901 Long term (current) use of anticoagulants: Secondary | ICD-10-CM | POA: Diagnosis not present

## 2014-03-06 DIAGNOSIS — I2510 Atherosclerotic heart disease of native coronary artery without angina pectoris: Secondary | ICD-10-CM | POA: Diagnosis not present

## 2014-03-06 DIAGNOSIS — I129 Hypertensive chronic kidney disease with stage 1 through stage 4 chronic kidney disease, or unspecified chronic kidney disease: Secondary | ICD-10-CM | POA: Insufficient documentation

## 2014-03-06 DIAGNOSIS — N183 Chronic kidney disease, stage 3 unspecified: Secondary | ICD-10-CM

## 2014-03-06 DIAGNOSIS — I951 Orthostatic hypotension: Secondary | ICD-10-CM | POA: Diagnosis not present

## 2014-03-06 DIAGNOSIS — Z87891 Personal history of nicotine dependence: Secondary | ICD-10-CM | POA: Insufficient documentation

## 2014-03-06 DIAGNOSIS — I480 Paroxysmal atrial fibrillation: Secondary | ICD-10-CM

## 2014-03-06 DIAGNOSIS — Z8673 Personal history of transient ischemic attack (TIA), and cerebral infarction without residual deficits: Secondary | ICD-10-CM | POA: Diagnosis not present

## 2014-03-06 DIAGNOSIS — M109 Gout, unspecified: Secondary | ICD-10-CM | POA: Insufficient documentation

## 2014-03-06 DIAGNOSIS — Z79899 Other long term (current) drug therapy: Secondary | ICD-10-CM | POA: Diagnosis not present

## 2014-03-06 DIAGNOSIS — I5042 Chronic combined systolic (congestive) and diastolic (congestive) heart failure: Secondary | ICD-10-CM | POA: Diagnosis present

## 2014-03-06 DIAGNOSIS — E785 Hyperlipidemia, unspecified: Secondary | ICD-10-CM | POA: Insufficient documentation

## 2014-03-06 DIAGNOSIS — R197 Diarrhea, unspecified: Secondary | ICD-10-CM | POA: Diagnosis not present

## 2014-03-06 LAB — PRO B NATRIURETIC PEPTIDE: Pro B Natriuretic peptide (BNP): 5943 pg/mL — ABNORMAL HIGH (ref 0–450)

## 2014-03-06 LAB — BASIC METABOLIC PANEL
ANION GAP: 14 (ref 5–15)
BUN: 51 mg/dL — ABNORMAL HIGH (ref 6–23)
CO2: 25 mEq/L (ref 19–32)
CREATININE: 1.8 mg/dL — AB (ref 0.50–1.35)
Calcium: 9 mg/dL (ref 8.4–10.5)
Chloride: 91 mEq/L — ABNORMAL LOW (ref 96–112)
GFR calc non Af Amer: 34 mL/min — ABNORMAL LOW (ref >90)
GFR, EST AFRICAN AMERICAN: 39 mL/min — AB (ref >90)
Glucose, Bld: 80 mg/dL (ref 70–99)
Potassium: 4.4 mEq/L (ref 3.7–5.3)
SODIUM: 130 meq/L — AB (ref 137–147)

## 2014-03-06 MED ORDER — TORSEMIDE 20 MG PO TABS: 20.0000 mg | ORAL_TABLET | Freq: Every day | ORAL | Status: DC

## 2014-03-06 MED ORDER — LISINOPRIL 5 MG PO TABS: 5.0000 mg | ORAL_TABLET | Freq: Every day | ORAL | Status: DC

## 2014-03-06 NOTE — Progress Notes (Signed)
Patient ID: Timothy Durham, male   DOB: 1930/07/25, 80 y.o.   MRN: [id number] PCP: Dr Luiz Iron Primary Cardiologist: Dr. Ladona Ridgel  HPI: [name] is an 80 yo male with a history of prior CVA in 2000 secondary to AF, NICM s/p CRT-P (02/17/14), chronic systolic HF, non-obstructive CAD, HTN, CKD stage III, HLD, PAD and bioprosthetic AVR. He has been admitted 3 times in the last couple of months for dyspnea that was related to volume overload.   Admitted to [facility] with volume overload. Diuresed with IV lasix and later transitioned to 40 mg torsemide twice a day. Overall he diuresed 10 pounds. Discharged weight was 152 pounds.   He returns for post hospital follow up. His wife called earlier today due to dizziness and he is being evaluated. He was just discharged on Monday but has been feeling worse over the last 2 days. He started colchicine on Monday and  developed diarrhea with dizziness. Says he had diarrhea for 24 hours and has stopped. Over the last 24 hours he has had increased fatigue and weakness. Increased dizziness when he stands that has been worse over the last 24 hours.  Denies SOB. Weight at home 152-153 pounds. Appetite good. Followed by [facility] for [facility] and HHPT. Wife manages his medications.   Studies: RHC (01/14/2014): RA 12, RV 42/11 (16), PA 42/23 (25), PCWP 17, PA 61%, Fick CO/CI: 4.53/2.27 LHC (01/14/1014): moderate non-obstructive CAD with 50% CFX  Echo (12/2013): EF 30-35%, grade III DD, AV bioprosthetic valve nl, V max: 0.96 cm^2, mod/severe MR, RV sys. fx moderately reduced, TR mild   ROS: All systems negative except as listed in HPI, PMH and Problem List.  SH:  History   Social History  . Marital Status: Married    Spouse Name: Timothy Durham    Number of Children: Timothy Durham  . Years of Education: Timothy Durham   Occupational History  . Not on file.   Social History Main Topics  . Smoking status: Former Smoker    Quit date: 09/26/1998  . Smokeless tobacco: Never Used  . Alcohol Use: 0.6 oz/week    1  Cans of beer per week     Comment: drinks one can of beer daily.  . Drug Use: No  . Sexual Activity: Not on file   Other Topics Concern  . Not on file   Social History Narrative    FH:  Family History  Problem Relation Age of Onset  . Stroke Timothy Durham   . Cancer Timothy Durham     Past Medical History  Diagnosis Date  . Arthritis   . Non-obstructive CAD     a. 12/2013 NSTEMI/Cath: LM nl, LAD 20, LCX 40-50, RCA 20.  Marland Kitchen Hypertension   . Stroke     a. July 2000;  b. January 2007;  c. TIA in 2014.  . CKD (chronic kidney disease), stage III   . GERD (gastroesophageal reflux disease)   . Foot pain, bilateral   . H/O hematuria   . Hyperlipidemia   . Vitamin D deficiency   . Chronic combined systolic and diastolic CHF (congestive heart failure)     a. 12/2013 Echo: EF 30-35%, mod conc LVH, Gr 3 DD, mild AI, mod-sev MR, mod dil LA, mild TR.  Marland Kitchen PAF (paroxysmal atrial fibrillation)     a. CHA2DS2VASc = 7--> Chronic Coumadin.  Marland Kitchen NICM (nonischemic cardiomyopathy)     a. 12/2013 Echo: EF 30-35%.  . Aortic valve prosthesis present     a. 2006: S/P bioprosthetic AVR.  Marland Kitchen  PAD (peripheral artery disease)     a. s/p LLE stenting.  . Carotid arterial disease     a. s/p R CEA  . History of tobacco abuse   . Moderate to Severe Mitral Regurgitation     a. 12/2013 Echo: Mod-Sev MR.  Marland Kitchen. Anxiety   . COPD (chronic obstructive pulmonary disease)   . Coronary arteriosclerosis Oct 2015    med Rx    Current Outpatient Prescriptions  Medication Sig Dispense Refill  . acetaminophen (TYLENOL) 500 MG tablet Take 1,000 mg by mouth every 6 (six) hours as needed for mild pain or headache.     . albuterol (PROVENTIL HFA;VENTOLIN HFA) 108 (90 BASE) MCG/ACT inhaler Inhale 2 puffs into the lungs every 6 (six) hours as needed for wheezing or shortness of breath.    . allopurinol (ZYLOPRIM) 100 MG tablet Take 200 mg by mouth daily.    Marland Kitchen. amiodarone (PACERONE) 200 MG tablet Take 1 tablet (200 mg total) by mouth 2  (two) times daily. 16 tablet 0  . [START ON 03/11/2014] amiodarone (PACERONE) 200 MG tablet Take 1 tablet (200 mg total) by mouth daily. On 03/11/14 start taking 1 tablet (200mg ) once daily. 30 tablet 11  . atorvastatin (LIPITOR) 40 MG tablet Take 40 mg by mouth every evening.    . Calcium Carb-Cholecalciferol (CALCIUM 600 + D PO) Take 2 tablets by mouth every morning.    . carvedilol (COREG) 3.125 MG tablet Take 1 tablet (3.125 mg total) by mouth 2 (two) times daily with a meal. 60 tablet 11  . clopidogrel (PLAVIX) 75 MG tablet Take 75 mg by mouth daily.    . DULoxetine (CYMBALTA) 30 MG capsule Take 30 mg by mouth daily.    Marland Kitchen. lisinopril (PRINIVIL,ZESTRIL) 5 MG tablet Take 1 tablet (5 mg total) by mouth daily. 30 tablet 11  . Omega-3 Fatty Acids (FISH OIL) 1200 MG CAPS Take 1 capsule by mouth every morning.    Marland Kitchen. omeprazole (PRILOSEC) 40 MG capsule Take 40 mg by mouth every morning.    Marland Kitchen. spironolactone (ALDACTONE) 25 MG tablet Take 0.5 tablets (12.5 mg total) by mouth daily. 30 tablet 6  . tamsulosin (FLOMAX) 0.4 MG CAPS capsule Take 1 capsule (0.4 mg total) by mouth daily. 30 capsule 11  . torsemide (DEMADEX) 20 MG tablet Take 2 tablets (40 mg total) by mouth daily. (Patient taking differently: Take 20 mg by mouth 2 (two) times daily. ) 60 tablet 3  . warfarin (COUMADIN) 1 MG tablet Take 1 mg by mouth once a week. On Sunday    . warfarin (COUMADIN) 4 MG tablet Take 4 mg by mouth See admin instructions. Monday-Saturday     No current facility-administered medications for this encounter.    Filed Vitals:   03/06/14 1106  BP: 102/49  Pulse: 75  Resp: 18  Weight: 156 lb 6 oz (70.931 kg)  SpO2: 99%    PHYSICAL EXAM: Orthostatic BP Sitting 80/40  Standing 60/40  General:  Elderly appearing. No resp difficulty Wife present  HEENT: normal Neck: supple. JVP flat. Carotids 2+ bilaterally; no bruits. No lymphadenopathy or thryomegaly appreciated. Cor: PMI normal. Regular rate & rhythm. No  rubs, gallops or murmurs. Lungs: clear Abdomen: soft, nontender, nondistended. No hepatosplenomegaly. No bruits or masses. Good bowel sounds. Extremities: no cyanosis, clubbing, rash, edema Neuro: alert & orientedx3, cranial nerves grossly intact. Moves all 4 extremities w/o difficulty. Affect pleasant.      ASSESSMENT & PLAN:  1) Chronic combined systolic/diastolic HF:  NICM s/p CRT-P, EF 30-35%, grade III DD, mod/severe MR, RV sys fx mod reduced (12/2013) -Volume status low likely due to diuretics and diarrhea. Diarrhea has resolved.  -Orthostatic in the clinic. Give 500 cc NS now. ---> SBP improved to 111/45 .  -Instructed to hold torsemide tonight and tomorrow.  -Cut back torsemide 20 mg daily. I have instructed him to hold torsemide if wieght is less than 155 pounds.  - Asked him to take in a little more fluid today - Continue AHC.  - Check BMET stat---> K 4.4 Creatinine 1.88  2) Orthostatic Hypotension  - As noted above. Given IV fluids and diuretics cut back.  3) PAF- regular rhythm . Continue amiodarone 200 mg twice a day. On coumadin daily. This is managed by his PCP.  4)  CKD stage III- Check BMET stat 5) Gout- on Allopurinol.   Follow up next week to reassess volume status.   Asianna Brundage NP-C  11:41 AM

## 2014-03-06 NOTE — Patient Instructions (Addendum)
Start taking your lisinopril 5mg  tablet at bedtime.  HOLD TORSEMIDE, then Saturday DECREASE to 20mg  tablet once in the morning. HOLD for weight less than 155lb.  Follow up in 1 week.  Do the following things EVERYDAY: 1) Weigh yourself in the morning before breakfast. Write it down and keep it in a log. 2) Take your medicines as prescribed 3) Eat low salt foods-Limit salt (sodium) to 2000 mg per day.  4) Stay as active as you can everyday 5) Limit all fluids for the day to less than 2 liters

## 2014-03-06 NOTE — Progress Notes (Signed)
Patient added on for "feeling sluggish, almost fainted at home".  BP initially 102/48, but after 5 minutes of sitting in clinic dropped to 80/40 while sitting, and 60/30 while standing.  PIV started in RLW x 1 attempt, 500 cc NS fluid bolus delivered over 1 hr per Amy Clegg NP-C verbal order.  Patient monitored closely in clinic with wife and call bell at bedside.  After bolus administered, BP rechecked, 111/45 while sitting, 94/45 while standing.  Patient scheduled to follow back up in clinic this coming Monday.  Ave Filter

## 2014-03-06 NOTE — Telephone Encounter (Signed)
error 

## 2014-03-10 ENCOUNTER — Ambulatory Visit (HOSPITAL_COMMUNITY)
Admit: 2014-03-10 | Discharge: 2014-03-10 | Disposition: A | Discharge status: Home / Self Care | Payer: Medicare HMO | Source: Ambulatory Visit | Attending: Internal Medicine | Admitting: Internal Medicine

## 2014-03-10 ENCOUNTER — Encounter (HOSPITAL_COMMUNITY): Payer: Self-pay

## 2014-03-10 VITALS — BP 110/44 | HR 79 | Wt 160.0 lb

## 2014-03-10 DIAGNOSIS — E785 Hyperlipidemia, unspecified: Secondary | ICD-10-CM | POA: Insufficient documentation

## 2014-03-10 DIAGNOSIS — I951 Orthostatic hypotension: Secondary | ICD-10-CM | POA: Diagnosis not present

## 2014-03-10 DIAGNOSIS — N183 Chronic kidney disease, stage 3 unspecified: Secondary | ICD-10-CM

## 2014-03-10 DIAGNOSIS — I429 Cardiomyopathy, unspecified: Secondary | ICD-10-CM | POA: Diagnosis not present

## 2014-03-10 DIAGNOSIS — I480 Paroxysmal atrial fibrillation: Secondary | ICD-10-CM | POA: Diagnosis not present

## 2014-03-10 DIAGNOSIS — Z79899 Other long term (current) drug therapy: Secondary | ICD-10-CM | POA: Insufficient documentation

## 2014-03-10 DIAGNOSIS — I2510 Atherosclerotic heart disease of native coronary artery without angina pectoris: Secondary | ICD-10-CM | POA: Insufficient documentation

## 2014-03-10 DIAGNOSIS — Z7901 Long term (current) use of anticoagulants: Secondary | ICD-10-CM | POA: Insufficient documentation

## 2014-03-10 DIAGNOSIS — Z8673 Personal history of transient ischemic attack (TIA), and cerebral infarction without residual deficits: Secondary | ICD-10-CM | POA: Diagnosis not present

## 2014-03-10 DIAGNOSIS — I5042 Chronic combined systolic (congestive) and diastolic (congestive) heart failure: Secondary | ICD-10-CM | POA: Insufficient documentation

## 2014-03-10 DIAGNOSIS — J449 Chronic obstructive pulmonary disease, unspecified: Secondary | ICD-10-CM | POA: Diagnosis not present

## 2014-03-10 DIAGNOSIS — I5022 Chronic systolic (congestive) heart failure: Secondary | ICD-10-CM

## 2014-03-10 DIAGNOSIS — I129 Hypertensive chronic kidney disease with stage 1 through stage 4 chronic kidney disease, or unspecified chronic kidney disease: Secondary | ICD-10-CM | POA: Diagnosis not present

## 2014-03-10 DIAGNOSIS — Z952 Presence of prosthetic heart valve: Secondary | ICD-10-CM | POA: Insufficient documentation

## 2014-03-10 DIAGNOSIS — I5020 Unspecified systolic (congestive) heart failure: Secondary | ICD-10-CM | POA: Diagnosis present

## 2014-03-10 DIAGNOSIS — Z87891 Personal history of nicotine dependence: Secondary | ICD-10-CM | POA: Insufficient documentation

## 2014-03-10 DIAGNOSIS — M109 Gout, unspecified: Secondary | ICD-10-CM | POA: Diagnosis not present

## 2014-03-10 LAB — BASIC METABOLIC PANEL
Anion gap: 12 (ref 5–15)
BUN: 47 mg/dL — ABNORMAL HIGH (ref 6–23)
CO2: 23 mEq/L (ref 19–32)
Calcium: 9.6 mg/dL (ref 8.4–10.5)
Chloride: 93 mEq/L — ABNORMAL LOW (ref 96–112)
Creatinine, Ser: 1.68 mg/dL — ABNORMAL HIGH (ref 0.50–1.35)
GFR, EST AFRICAN AMERICAN: 42 mL/min — AB (ref >90)
GFR, EST NON AFRICAN AMERICAN: 37 mL/min — AB (ref >90)
Glucose, Bld: 93 mg/dL (ref 70–99)
POTASSIUM: 5.8 meq/L — AB (ref 3.7–5.3)
SODIUM: 128 meq/L — AB (ref 137–147)

## 2014-03-10 MED ORDER — TORSEMIDE 20 MG PO TABS: 20.0000 mg | ORAL_TABLET | Freq: Every day | ORAL | Status: DC

## 2014-03-10 MED ORDER — AMIODARONE HCL 200 MG PO TABS: 200.0000 mg | ORAL_TABLET | Freq: Every day | ORAL | Status: DC

## 2014-03-10 MED ORDER — TAMSULOSIN HCL 0.4 MG PO CAPS: 0.4000 mg | ORAL_CAPSULE | Freq: Every day | ORAL | Status: DC

## 2014-03-10 MED ORDER — LISINOPRIL 5 MG PO TABS: 5.0000 mg | ORAL_TABLET | Freq: Every day | ORAL | Status: DC

## 2014-03-10 MED ORDER — TORSEMIDE 20 MG PO TABS: 20.0000 mg | ORAL_TABLET | ORAL | Status: DC | PRN

## 2014-03-10 MED ORDER — SPIRONOLACTONE 25 MG PO TABS: 12.5000 mg | ORAL_TABLET | Freq: Every day | ORAL | Status: DC

## 2014-03-10 MED ORDER — CARVEDILOL 3.125 MG PO TABS: 3.1250 mg | ORAL_TABLET | Freq: Two times a day (BID) | ORAL | Status: DC

## 2014-03-10 NOTE — Patient Instructions (Signed)
Take Torsemide (Demadex) only if weight is greater than 158 lb  Change Flomax to take in the evening  Decrease Amiodarone to 200 mg daily  Lab today  Your physician recommends that you schedule a follow-up appointment in: 3-4 weeks

## 2014-03-10 NOTE — Addendum Note (Signed)
Encounter addended by: Noralee Space, RN on: 03/10/2014 10:54 AM<BR>     Documentation filed: Dx Association, Patient Instructions Section, Orders

## 2014-03-10 NOTE — Progress Notes (Signed)
Patient ID: Timothy Durham, male   DOB: Mar 04, 1931, 80 y.o.   MRN: [id number] PCP: Dr Luiz Ironabeza Primary Cardiologist: Dr. Ladona Ridgelaylor  HPI: [name] is an 80 yo male with a history of prior CVA in 2000 secondary to AF, NICM s/p CRT-P (02/17/14), chronic systolic HF, non-obstructive CAD, HTN, CKD stage III, HLD, PAD and bioprosthetic AVR. He has been admitted 3 times in the last couple of months for dyspnea that was related to volume overload.   [facility]. Diuresed with IV lasix and later transitioned to 40 mg torsemide twice a day. Overall he diuresed 10 pounds. Discharged weight was 152 pounds.   We saw him last week for post-hospital f/u. He was orthostatic in setting of diarrhea from colchicine. Given IVF and demadex held for a couple of days. On 12/12 restarted demadex at 20mg  daily as weight hit 155. Lisinopril changed to qhs. Still with dizziness in the am after taking meds. No edema or dyspnea. Appetite good. [facility]. Wife manages his medications.   Studies: RHC (01/14/2014): RA 12, RV 42/11 (16), PA 42/23 (25), PCWP 17, PA 61%, Fick CO/CI: 4.53/2.27 LHC (01/14/1014): moderate non-obstructive CAD with 50% CFX  Echo (12/2013): EF 30-35%, grade III DD, AV bioprosthetic valve nl, V max: 0.96 cm^2, mod/severe MR, RV sys. fx moderately reduced, TR mild   ROS: All systems negative except as listed in HPI, PMH and Problem List.  SH:  History   Social History  . Marital Status: Married    Spouse Name: N/A    Number of Children: N/A  . Years of Education: N/A   Occupational History  . Not on file.   Social History Main Topics  . Smoking status: Former Smoker    Quit date: 09/26/1998  . Smokeless tobacco: Never Used  . Alcohol Use: 0.6 oz/week    1 Cans of beer per week     Comment: drinks one can of beer daily.  . Drug Use: No  . Sexual Activity: Not on file   Other Topics Concern  . Not on file   Social History Narrative    FH:   Family History  Problem Relation Age of Onset  . Stroke Mother   . Cancer Father     Past Medical History  Diagnosis Date  . Arthritis   . Non-obstructive CAD     a. 12/2013 NSTEMI/Cath: LM nl, LAD 20, LCX 40-50, RCA 20.  Marland Kitchen. Hypertension   . Stroke     a. July 2000;  b. January 2007;  c. TIA in 2014.  . CKD (chronic kidney disease), stage III   . GERD (gastroesophageal reflux disease)   . Foot pain, bilateral   . H/O hematuria   . Hyperlipidemia   . Vitamin D deficiency   . Chronic combined systolic and diastolic CHF (congestive heart failure)     a. 12/2013 Echo: EF 30-35%, mod conc LVH, Gr 3 DD, mild AI, mod-sev MR, mod dil LA, mild TR.  Marland Kitchen. PAF (paroxysmal atrial fibrillation)     a. CHA2DS2VASc = 7--> Chronic Coumadin.  Marland Kitchen. NICM (nonischemic cardiomyopathy)     a. 12/2013 Echo: EF 30-35%.  . Aortic valve prosthesis present     a. 2006: S/P bioprosthetic AVR.  Marland Kitchen. PAD (peripheral artery disease)     a. s/p LLE stenting.  . Carotid arterial disease     a. s/p R CEA  . History of tobacco abuse   .  Moderate to Severe Mitral Regurgitation     a. 12/2013 Echo: Mod-Sev MR.  Marland Kitchen Anxiety   . COPD (chronic obstructive pulmonary disease)   . Coronary arteriosclerosis Oct 2015    med Rx    Current Outpatient Prescriptions  Medication Sig Dispense Refill  . acetaminophen (TYLENOL) 500 MG tablet Take 1,000 mg by mouth every 6 (six) hours as needed for mild pain or headache.     . albuterol (PROVENTIL HFA;VENTOLIN HFA) 108 (90 BASE) MCG/ACT inhaler Inhale 2 puffs into the lungs every 6 (six) hours as needed for wheezing or shortness of breath.    . allopurinol (ZYLOPRIM) 100 MG tablet Take 200 mg by mouth daily.    Marland Kitchen amiodarone (PACERONE) 200 MG tablet Take 1 tablet (200 mg total) by mouth 2 (two) times daily. 16 tablet 0  . atorvastatin (LIPITOR) 40 MG tablet Take 40 mg by mouth every evening.    . Calcium Carb-Cholecalciferol (CALCIUM 600 + D PO) Take 2 tablets by mouth every morning.     . carvedilol (COREG) 3.125 MG tablet Take 1 tablet (3.125 mg total) by mouth 2 (two) times daily with a meal. 60 tablet 11  . clopidogrel (PLAVIX) 75 MG tablet Take 75 mg by mouth daily.    . DULoxetine (CYMBALTA) 30 MG capsule Take 30 mg by mouth daily.    Marland Kitchen lisinopril (PRINIVIL,ZESTRIL) 5 MG tablet Take 1 tablet (5 mg total) by mouth at bedtime. 30 tablet 11  . Omega-3 Fatty Acids (FISH OIL) 1200 MG CAPS Take 1 capsule by mouth every morning.    Marland Kitchen omeprazole (PRILOSEC) 40 MG capsule Take 40 mg by mouth every morning.    Marland Kitchen spironolactone (ALDACTONE) 25 MG tablet Take 0.5 tablets (12.5 mg total) by mouth daily. 30 tablet 6  . tamsulosin (FLOMAX) 0.4 MG CAPS capsule Take 1 capsule (0.4 mg total) by mouth daily. 30 capsule 11  . torsemide (DEMADEX) 20 MG tablet Take 1 tablet (20 mg total) by mouth daily. 90 tablet 3  . warfarin (COUMADIN) 1 MG tablet Take 1 mg by mouth once a week. On Sunday    . warfarin (COUMADIN) 4 MG tablet Take 4 mg by mouth See admin instructions. Monday-Saturday     No current facility-administered medications for this encounter.    Filed Vitals:   03/10/14 0956  BP: 110/44  Pulse: 79  Weight: 160 lb (72.576 kg)  SpO2: 97%    PHYSICAL EXAM: Orthostatic BP Sitting 92/34 Standing 80/32  General:  Elderly appearing. No resp difficulty Wife present  HEENT: normal Neck: supple. JVP flat. Carotids 2+ bilaterally; no bruits. No lymphadenopathy or thryomegaly appreciated. Cor: PMI normal. Regular rate & rhythm. No rubs, gallops. 2/6 MR Lungs: clear Abdomen: soft, nontender, nondistended. No hepatosplenomegaly. No bruits or masses. Good bowel sounds. Extremities: no cyanosis, clubbing, rash, edema Neuro: alert & orientedx3, cranial nerves grossly intact. Moves all 4 extremities w/o difficulty. Affect pleasant    ASSESSMENT & PLAN:  1) Chronic combined systolic/diastolic HF: NICM s/p CRT-P, EF 30-35%, grade III DD, mod/severe MR, RV sys fx mod reduced  (12/2013) -Improving but still dry and orthostatic.  -Hold torsemide unless weight 158 or greater. Move Flomax to bedtime.  - Continue AHC.  - BMET stoday - If still dizzy on Wednesday. Call us.  2) Orthostatic Hypotension  - As noted above.   3) PAF- regular rhythm . Continue amiodarone 200 mg twice a day. Will cut to daily. On coumadin daily.  4)  CKD stage  III- Check BMET  5) Gout- on Allopurinol.   Follow up 3 weeks.  Arvilla Meres MD  10:25 AM

## 2014-03-11 ENCOUNTER — Telehealth (HOSPITAL_COMMUNITY): Payer: Self-pay | Admitting: Vascular Surgery

## 2014-03-11 ENCOUNTER — Telehealth: Payer: Self-pay | Admitting: Neurology

## 2014-03-11 NOTE — Telephone Encounter (Signed)
[place], pt's wife called to cancel his NP appt w/ Dr. Everlena Cooper on 03/20/14.  Dr. Hocherin/referring provider was notified.

## 2014-03-11 NOTE — Telephone Encounter (Signed)
Beth from [facility] physical therapy left message.. Pt had 2 episodes of dizziness today... bp standing 60/34 , bp sitting 90/60.Marland Kitchen Pt drunk a bottle of water bp 104/40.Marland Kitchen Please advise

## 2014-03-11 NOTE — Telephone Encounter (Signed)
Per Dr. Gala Romney instructed to STOP lisinopril.  Will call us in AM to update on morning BP.  Ave Filter

## 2014-03-12 ENCOUNTER — Telehealth: Payer: Self-pay

## 2014-03-12 NOTE — Telephone Encounter (Signed)
Wife called to update [place] on BPs since discontinuing lisinopril  0930  128/59 1115  91/71  Patient feels much better.  Had to take torsemide for weight 158.8  Also still taking spironolactone, per notes from 12/14 needs to stop this for K of 5.8.  Advised patient this as well.  Will call us again tomorrow to check on BP and patient s/s again.  Aware and agreeable to plan.  Ave Filter

## 2014-03-13 ENCOUNTER — Telehealth (HOSPITAL_COMMUNITY): Payer: Self-pay | Admitting: *Deleted

## 2014-03-13 NOTE — Telephone Encounter (Signed)
Pt's wife aware.

## 2014-03-13 NOTE — Telephone Encounter (Signed)
Wife called to update [place] on BPs  149/76 this am around 8:00  116/51 around 11:30  Patient feels much better.  Ave Filter

## 2014-03-13 NOTE — Telephone Encounter (Signed)
-----   Message from Noralee Space, RN sent at 03/10/2014  4:59 PM EST ----- K elevated, per Dr Gala Romney stop Cleda Daub recheck on Fri, attempted to call pt and Left message to call back

## 2014-03-14 ENCOUNTER — Encounter: Payer: Self-pay | Admitting: Internal Medicine

## 2014-03-20 ENCOUNTER — Ambulatory Visit: Payer: Medicare HMO | Admitting: Neurology

## 2014-03-27 ENCOUNTER — Telehealth (HOSPITAL_COMMUNITY): Payer: Self-pay | Admitting: Vascular Surgery

## 2014-03-27 NOTE — Telephone Encounter (Signed)
Pt weight is from yesterday was 160.8 today it 163.Marland Kitchen Please advise

## 2014-03-31 ENCOUNTER — Ambulatory Visit (HOSPITAL_COMMUNITY)
Admission: RE | Admit: 2014-03-31 | Discharge: 2014-03-31 | Disposition: A | Discharge status: Home / Self Care | Payer: Medicare HMO | Source: Ambulatory Visit | Attending: Internal Medicine | Admitting: Internal Medicine

## 2014-03-31 VITALS — BP 112/50 | HR 94 | Wt 164.0 lb

## 2014-03-31 DIAGNOSIS — M109 Gout, unspecified: Secondary | ICD-10-CM | POA: Diagnosis not present

## 2014-03-31 DIAGNOSIS — Z954 Presence of other heart-valve replacement: Secondary | ICD-10-CM | POA: Insufficient documentation

## 2014-03-31 DIAGNOSIS — I951 Orthostatic hypotension: Secondary | ICD-10-CM | POA: Diagnosis not present

## 2014-03-31 DIAGNOSIS — I480 Paroxysmal atrial fibrillation: Secondary | ICD-10-CM | POA: Diagnosis not present

## 2014-03-31 DIAGNOSIS — I5042 Chronic combined systolic (congestive) and diastolic (congestive) heart failure: Secondary | ICD-10-CM | POA: Insufficient documentation

## 2014-03-31 DIAGNOSIS — Z7901 Long term (current) use of anticoagulants: Secondary | ICD-10-CM | POA: Insufficient documentation

## 2014-03-31 DIAGNOSIS — N183 Chronic kidney disease, stage 3 (moderate): Secondary | ICD-10-CM | POA: Diagnosis not present

## 2014-03-31 DIAGNOSIS — I5022 Chronic systolic (congestive) heart failure: Secondary | ICD-10-CM

## 2014-03-31 MED ORDER — TORSEMIDE 20 MG PO TABS: 20.0000 mg | ORAL_TABLET | Freq: Every day | ORAL | Status: DC

## 2014-03-31 NOTE — Addendum Note (Signed)
Encounter addended by: Noralee Space, RN on: 03/31/2014  1:10 PM<BR>     Documentation filed: Patient Instructions Section, Orders

## 2014-03-31 NOTE — Telephone Encounter (Signed)
Left message to return call 

## 2014-03-31 NOTE — Patient Instructions (Signed)
Torsemide dose:  If weight less than 158 lb NOTHING  If weight 158-162 lb TAKE TORSEMIDE 20 MG DAILY  If weight 163 lb or greater take TORSEMIDE 20 MG Twice daily   Labs with [facility]  Your physician recommends that you schedule a follow-up appointment in: 1 month

## 2014-03-31 NOTE — Progress Notes (Signed)
Patient ID: Timothy Durham, male   DOB: Feb 03, 1931, 81 y.o.   MRN: [id number] PCP: Dr Luiz Iron Primary Cardiologist: Dr. Ladona Ridgel  HPI: Timothy Durham is an 81 yo male with a history of prior CVA in 2000 secondary to AF, NICM s/p CRT-P (02/17/14), chronic systolic HF, non-obstructive CAD, HTN, CKD stage III, HLD, PAD and bioprosthetic AVR. He has been admitted 3 times in the last couple of months for dyspnea that was related to volume overload.   Admitted to [facility] with volume overload. Diuresed with IV lasix and later transitioned to 40 mg torsemide twice a day. Overall he diuresed 10 pounds. Discharged weight was 152 pounds.   We saw him on 12/14. BP was soft. Weight at home 154-155 (in clinic 160) We told him to take torsemide only if weight > 158 at home. Unfortunately he remained orthostatic so lisinopril stopped. Then spiro stopped for K 5.8. Now feeling much better. Taking torsemide 20 mg daily. Took extra 20 recently because weight up to 164. No edema or dyspnea. Appetite good. Followed by [facility] for [facility] and HHPT. Wife manages his medications. SBP 120-130 range  Studies: RHC (01/14/2014): RA 12, RV 42/11 (16), PA 42/23 (25), PCWP 17, PA 61%, Fick CO/CI: 4.53/2.27 LHC (01/14/1014): moderate non-obstructive CAD with 50% CFX  Echo (12/2013): EF 30-35%, grade III DD, AV bioprosthetic valve nl, V max: 0.96 cm^2, mod/severe MR, RV sys. fx moderately reduced, TR mild   ROS: All systems negative except as listed in HPI, PMH and Problem List.  SH:  History   Social History  . Marital Status: Married    Spouse Name: N/A    Number of Children: N/A  . Years of Education: N/A   Occupational History  . Not on file.   Social History Main Topics  . Smoking status: Former Smoker    Quit date: 09/26/1998  . Smokeless tobacco: Never Used  . Alcohol Use: 0.6 oz/week    1 Cans of beer per week     Comment: drinks one can of beer daily.  . Drug Use: No  . Sexual Activity: Not on file   Other Topics Concern   . Not on file   Social History Narrative    FH:  Family History  Problem Relation Age of Onset  . Stroke Mother   . Cancer Father     Past Medical History  Diagnosis Date  . Arthritis   . Non-obstructive CAD     a. 12/2013 NSTEMI/Cath: LM nl, LAD 20, LCX 40-50, RCA 20.  Marland Kitchen Hypertension   . Stroke     a. July 2000;  b. January 2007;  c. TIA in 2014.  . CKD (chronic kidney disease), stage III   . GERD (gastroesophageal reflux disease)   . Foot pain, bilateral   . H/O hematuria   . Hyperlipidemia   . Vitamin D deficiency   . Chronic combined systolic and diastolic CHF (congestive heart failure)     a. 12/2013 Echo: EF 30-35%, mod conc LVH, Gr 3 DD, mild AI, mod-sev MR, mod dil LA, mild TR.  Marland Kitchen PAF (paroxysmal atrial fibrillation)     a. CHA2DS2VASc = 7--> Chronic Coumadin.  Marland Kitchen NICM (nonischemic cardiomyopathy)     a. 12/2013 Echo: EF 30-35%.  . Aortic valve prosthesis present     a. 2006: S/P bioprosthetic AVR.  Marland Kitchen PAD (peripheral artery disease)     a. s/p LLE stenting.  . Carotid arterial disease     a. s/p R  CEA  . History of tobacco abuse   . Moderate to Severe Mitral Regurgitation     a. 12/2013 Echo: Mod-Sev MR.  Marland Kitchen Anxiety   . COPD (chronic obstructive pulmonary disease)   . Coronary arteriosclerosis Oct 2015    med Rx    Current Outpatient Prescriptions  Medication Sig Dispense Refill  . acetaminophen (TYLENOL) 500 MG tablet Take 1,000 mg by mouth every 6 (six) hours as needed for mild pain or headache.     . albuterol (PROVENTIL HFA;VENTOLIN HFA) 108 (90 BASE) MCG/ACT inhaler Inhale 2 puffs into the lungs every 6 (six) hours as needed for wheezing or shortness of breath.    Marland Kitchen alendronate (FOSAMAX) 70 MG tablet Take 70 mg by mouth once a week. Take with a full glass of water on an empty stomach.    Marland Kitchen allopurinol (ZYLOPRIM) 100 MG tablet Take 200 mg by mouth daily.    Marland Kitchen amiodarone (PACERONE) 200 MG tablet Take 1 tablet (200 mg total) by mouth daily. 90 tablet 3   . atorvastatin (LIPITOR) 40 MG tablet Take 40 mg by mouth every evening.    . Calcium Carb-Cholecalciferol (CALCIUM 600 + D PO) Take 2 tablets by mouth every morning.    . carvedilol (COREG) 3.125 MG tablet Take 1 tablet (3.125 mg total) by mouth 2 (two) times daily with a meal. 180 tablet 3  . clopidogrel (PLAVIX) 75 MG tablet Take 75 mg by mouth daily.    . DULoxetine (CYMBALTA) 30 MG capsule Take 30 mg by mouth daily.    . Omega-3 Fatty Acids (FISH OIL) 1200 MG CAPS Take 1 capsule by mouth every morning.    Marland Kitchen omeprazole (PRILOSEC) 40 MG capsule Take 40 mg by mouth every morning.    . tamsulosin (FLOMAX) 0.4 MG CAPS capsule Take 1 capsule (0.4 mg total) by mouth daily after supper. 90 capsule 3  . torsemide (DEMADEX) 20 MG tablet Take 1 tablet (20 mg total) by mouth daily. For weight greater than 158 lb 90 tablet 3  . warfarin (COUMADIN) 1 MG tablet Take 1 mg by mouth once a week. On Sunday    . warfarin (COUMADIN) 4 MG tablet Take 4 mg by mouth See admin instructions. Monday-Saturday     No current facility-administered medications for this encounter.    Filed Vitals:   03/31/14 1156  BP: 112/50  Pulse: 94  Weight: 164 lb (74.39 kg)  SpO2: 97%    PHYSICAL EXAM: General:  Elderly appearing. No resp difficulty Wife present  HEENT: normal Neck: supple. JVP flat. Carotids 2+ bilaterally; no bruits. No lymphadenopathy or thryomegaly appreciated. Cor: PMI normal. Regular rate & rhythm. No rubs, gallops. 2/6 MR Lungs: clear Abdomen: soft, nontender, nondistended. No hepatosplenomegaly. No bruits or masses. Good bowel sounds. Extremities: no cyanosis, clubbing, rash, edema Neuro: alert & orientedx3, cranial nerves grossly intact. Moves all 4 extremities w/o difficulty. Affect pleasant   ASSESSMENT & PLAN:  1) Chronic combined systolic/diastolic HF: NICM s/p CRT-P, EF 30-35%, grade III DD, mod/severe MR, RV sys fx mod reduced (12/2013) -Much improved with stopping lisinopril and  spiro and letting BP drift up.  - Goal weight seems to be 158-162. - For now our regimen will be  Weight  < 158 = no demadex 158-162 = demadex 20 daily >163 = demadex 20 bid  -Will hold lisinopril for now. Will recchck BMET. If potassium ok may try gentle rechallenge with spiro 12.5. -Will consider increasing carvedilol to 6.25mg  bid at next  visit if remains stable   2) Orthostatic Hypotension  - As noted above.   3) PAF- regular rhythm . Continue amiodarone 200 mg daily.  On coumadin and plavix daily (had TIA on coumadin). Off ASA. We discussed possible switch to NOAC but would be off-label due to bioprosthetic AVR.  4)  CKD stage III- Check BMET  5) Gout- on Allopurinol.  6) Bioprosthetic AVR - stable on recent echo  Follow up 3-4 weeks.  Arvilla Meres MD  12:31 PM

## 2014-04-02 ENCOUNTER — Telehealth: Payer: Self-pay | Admitting: Internal Medicine

## 2014-04-02 NOTE — Telephone Encounter (Signed)
LMTCB//sss 

## 2014-04-02 NOTE — Telephone Encounter (Signed)
[name] ([facility] Nurse) wants to make sure that Dr.Hochrein is aware that pt's diastolic BP staying in the 50s. If he is fine with this then she would like to change the alert from 60 mm Hg to 50 mm Hg. She states that the pt is asymptomatic. Will forward to [name] [facility] nurse).

## 2014-04-02 NOTE — Telephone Encounter (Signed)
New Message       1. Has your device fired? no  2. Is you device beeping?n/a   3. Are you experiencing draining or swelling at device site? n/a  4. Are you calling to see if we received your device transmission?yes   5. Have you passed out? No  Patient gets on scale and the B/P is transmitted to their office ([facility]) and his Diastolic pressure is in 50 and range that is set is 60 or above and it alerts them all the time and they want it set.   Please call.

## 2014-04-04 ENCOUNTER — Ambulatory Visit (INDEPENDENT_AMBULATORY_CARE_PROVIDER_SITE_OTHER): Payer: Medicare HMO | Admitting: Cardiology

## 2014-04-04 ENCOUNTER — Encounter: Payer: Self-pay | Admitting: Cardiology

## 2014-04-04 ENCOUNTER — Other Ambulatory Visit: Payer: Self-pay | Admitting: *Deleted

## 2014-04-04 VITALS — BP 120/50 | Ht 70.0 in | Wt 164.4 lb

## 2014-04-04 DIAGNOSIS — I480 Paroxysmal atrial fibrillation: Secondary | ICD-10-CM

## 2014-04-04 DIAGNOSIS — E878 Other disorders of electrolyte and fluid balance, not elsewhere classified: Secondary | ICD-10-CM

## 2014-04-04 MED ORDER — CARVEDILOL 6.25 MG PO TABS: 6.2500 mg | ORAL_TABLET | Freq: Two times a day (BID) | ORAL | Status: DC

## 2014-04-04 NOTE — Patient Instructions (Signed)
Your physician recommends that you schedule a follow-up appointment in: 2 months with Dr Antoine Poche  Have your bloodwork done as soon as you can  We are increasing your coreg to 6.25 mg two times a day

## 2014-04-04 NOTE — Progress Notes (Signed)
HPI The patient presents for followup. He has a complicated past history with coronary artery disease and cardiomyopathy. He has had a reduced EF of 30-35%. Cardiac catheterization demonstrated nonobstructive disease.   He was followed in the heart failure clinic. Lisinopril and spirolonlactone were stopped because of low blood pressures.   Thankfully he is better. He is having much less shortness of breath. He's not had any presyncope or syncope. He denies any chest pressure, neck or arm discomfort. He's starting to do a little activity. His weights have been somewhat stable between 158 162 pounds.    Allergies  Allergen Reactions  . Ambien [Zolpidem] Other (See Comments)    Hallucinations  . Ativan [Lorazepam] Other (See Comments)    hallucinations  . Penicillins     Unknown childhood reaction   . Sulfa Antibiotics     unknown    Current Outpatient Prescriptions  Medication Sig Dispense Refill  . acetaminophen (TYLENOL) 500 MG tablet Take 1,000 mg by mouth every 6 (six) hours as needed for mild pain or headache.     . allopurinol (ZYLOPRIM) 100 MG tablet Take 200 mg by mouth daily.    Marland Kitchen amiodarone (PACERONE) 200 MG tablet Take 1 tablet (200 mg total) by mouth daily. 90 tablet 3  . atorvastatin (LIPITOR) 40 MG tablet Take 40 mg by mouth every evening.    . Calcium Carb-Cholecalciferol (CALCIUM 600 + D PO) Take 2 tablets by mouth every morning.    . carvedilol (COREG) 3.125 MG tablet Take 1 tablet (3.125 mg total) by mouth 2 (two) times daily with a meal. 180 tablet 3  . clopidogrel (PLAVIX) 75 MG tablet Take 75 mg by mouth daily.    . DULoxetine (CYMBALTA) 30 MG capsule Take 30 mg by mouth daily.    . Omega-3 Fatty Acids (FISH OIL) 1200 MG CAPS Take 1 capsule by mouth every morning.    Marland Kitchen omeprazole (PRILOSEC) 40 MG capsule Take 40 mg by mouth every morning.    . tamsulosin (FLOMAX) 0.4 MG CAPS capsule Take 1 capsule (0.4 mg total) by mouth daily after supper. 90 capsule 3  .  torsemide (DEMADEX) 20 MG tablet Take 1 tablet (20 mg total) by mouth daily. For weight greater than 158-162 lb.  If weight greater than 163 lb take 20 mg Twice daily (Patient taking differently: Take 20 mg by mouth 2 (two) times daily. For weight greater than 158-162 lb.  If weight greater than 163 lb take 20 mg Twice daily) 90 tablet 3  . warfarin (COUMADIN) 4 MG tablet Take 4 mg by mouth See admin instructions. Friday, Saturday, and Sunday . Monday Tuesday Wednesday and Thursday 4 mg    . DULoxetine (CYMBALTA) 60 MG capsule      No current facility-administered medications for this visit.    Past Medical History  Diagnosis Date  . Arthritis   . Non-obstructive CAD     a. 12/2013 NSTEMI/Cath: LM nl, LAD 20, LCX 40-50, RCA 20.  Marland Kitchen Hypertension   . Stroke     a. July 2000;  b. January 2007;  c. TIA in 2014.  . CKD (chronic kidney disease), stage III   . GERD (gastroesophageal reflux disease)   . Foot pain, bilateral   . H/O hematuria   . Hyperlipidemia   . Vitamin D deficiency   . Chronic combined systolic and diastolic CHF (congestive heart failure)     a. 12/2013 Echo: EF 30-35%, mod conc LVH, Gr 3 DD,  mild AI, mod-sev MR, mod dil LA, mild TR.  Marland Kitchen PAF (paroxysmal atrial fibrillation)     a. CHA2DS2VASc = 7--> Chronic Coumadin.  Marland Kitchen NICM (nonischemic cardiomyopathy)     a. 12/2013 Echo: EF 30-35%.  . Aortic valve prosthesis present     a. 2006: S/P bioprosthetic AVR.  Marland Kitchen PAD (peripheral artery disease)     a. s/p LLE stenting.  . Carotid arterial disease     a. s/p R CEA  . History of tobacco abuse   . Moderate to Severe Mitral Regurgitation     a. 12/2013 Echo: Mod-Sev MR.  Marland Kitchen Anxiety   . COPD (chronic obstructive pulmonary disease)   . Coronary arteriosclerosis Oct 2015    med Rx    Past Surgical History  Procedure Laterality Date  . Aortic valve replacement (avr)/coronary artery bypass grafting (cabg)  12/2008  . Eye surgery    . Carotid angiogram  1996  . Left lower  extremity stent    . Carotid endarterectomy    . Insert / replace / remove pacemaker    . Coronary angiogram  Oct 2015  . Pacemaker insertion  Nov 2015    MDT BiV  . Left and right heart catheterization with coronary angiogram N/A 01/14/2014    Procedure: LEFT AND RIGHT HEART CATHETERIZATION WITH CORONARY ANGIOGRAM;  Surgeon: Peter M Swaziland, MD;  Location: [facility] CATH LAB;  Service: Cardiovascular;  Laterality: N/A;  . Bi-ventricular pacemaker insertion N/A 02/17/2014    Procedure: BI-VENTRICULAR PACEMAKER INSERTION (CRT-P);  Surgeon: Marinus Maw, MD;  Location: [facility] CATH LAB;  Service: Cardiovascular;  Laterality: N/A;    ROS:  As stated in the HPI and negative for all other systems.  PHYSICAL EXAM BP 120/50 mmHg  Ht  (1.778 m)  Wt 164 lb 6.4 oz (74.571 kg)  BMI 23.59 kg/m2 GENERAL:  Frail appearing but no distress HEENT:  Pupils equal round and reactive, fundi not visualized, oral mucosa unremarkable NECK:  No jugular venous distention, waveform within normal limits, carotid upstroke brisk and symmetric, no bruits, no thyromegaly LYMPHATICS:  No cervical, inguinal adenopathy LUNGS:  Clear to auscultation bilaterally BACK:  No CVA tenderness CHEST:  Unremarkable HEART:  PMI not displaced or sustained,S1 and S2 within normal limits, no S3, no S4, no clicks, no rubs, 3/6 apical holosystolic murmur, no diastolic murmurs ABD:  Flat, positive bowel sounds normal in frequency in pitch, no bruits, no rebound, no guarding, no midline pulsatile mass, no hepatomegaly, no splenomegaly EXT:  2 plus pulses throughout, trace edema, no cyanosis no clubbing SKIN:  No rashes no nodules NEURO:  Cranial nerves II through XII grossly intact, motor grossly intact throughout [facility]:  Cognitively intact, oriented to person place and time  EKG:  Sinus rhythm, with ventricular pacing rate 81.  04/04/2014   ASSESSMENT AND PLAN  CHRONIC SYSTOLIC AND DIASTOLIC HF:    He seems to be euvolemic but he is  needing twice a day torsemide.  I will check a basic metabolic profile today. For now we will continue with the current dose of torsemide with an extra 20 mg as his when necessary dose. He sees a heart failure clinic soon he will likely have spironolactone restarted. Today I will increase his carvedilol to 6.25 mg twice daily but with recent hypotension we will move slowly.  ATRIAL FIB:  He is in sinus rhythm. He will continue with meds as listed. He tolerates anticoagulation.  CAD:  Nonobstructive.  ANXIETY:  This is  much improved with medication adjustment and improvement in his breathing.  CKD:  We will follow this closely.

## 2014-04-08 ENCOUNTER — Telehealth (HOSPITAL_COMMUNITY): Payer: Self-pay | Admitting: Vascular Surgery

## 2014-04-08 NOTE — Telephone Encounter (Signed)
Nurse from [facility] called pt has been complaining SOB for the past few days, belly is full, deminished right lung productive cough, Nurse has questions about pt Torsemide..please advise

## 2014-04-09 NOTE — Telephone Encounter (Signed)
Spoke with wife concerning patient.  States he has been more SOB off and on for last few days, "belly breathing" during exacerbations.  Has been taking 20mg  torsemide twice daily for last couple of days since this started with no result in increased urinary output.  Wife believes overtime his output has slowly diminished.  Also noticed sometimes when he urinates there is blood ting and discomfort at times.  Weight 163lb which is slightly high for him.  Per [facility] nurse, lung sounds diminished bilaterally at bases.  Per Amy Clegg NP-C, have patient take 2 torsemide tablets this afternoon and add on to tomorrow; clinic schedule to be seen.  Labs drawn at home 04/08/14 shows Cr 1.6 and BUN 25.  Wife and patient aware and agreeable to plan as stated.  Ave Filter

## 2014-04-10 ENCOUNTER — Ambulatory Visit (HOSPITAL_COMMUNITY)
Admission: RE | Admit: 2014-04-10 | Discharge: 2014-04-10 | Disposition: A | Discharge status: Home / Self Care | Payer: Medicare HMO | Source: Ambulatory Visit | Attending: Internal Medicine | Admitting: Internal Medicine

## 2014-04-10 VITALS — BP 116/52 | HR 89 | Wt 165.4 lb

## 2014-04-10 DIAGNOSIS — N183 Chronic kidney disease, stage 3 unspecified: Secondary | ICD-10-CM

## 2014-04-10 DIAGNOSIS — I5042 Chronic combined systolic (congestive) and diastolic (congestive) heart failure: Secondary | ICD-10-CM | POA: Diagnosis present

## 2014-04-10 DIAGNOSIS — M109 Gout, unspecified: Secondary | ICD-10-CM | POA: Diagnosis not present

## 2014-04-10 DIAGNOSIS — Z7901 Long term (current) use of anticoagulants: Secondary | ICD-10-CM | POA: Diagnosis not present

## 2014-04-10 DIAGNOSIS — I951 Orthostatic hypotension: Secondary | ICD-10-CM | POA: Insufficient documentation

## 2014-04-10 DIAGNOSIS — I480 Paroxysmal atrial fibrillation: Secondary | ICD-10-CM | POA: Diagnosis not present

## 2014-04-10 DIAGNOSIS — I5022 Chronic systolic (congestive) heart failure: Secondary | ICD-10-CM | POA: Diagnosis not present

## 2014-04-10 MED ORDER — SPIRONOLACTONE 25 MG PO TABS: 12.5000 mg | ORAL_TABLET | Freq: Every day | ORAL | Status: DC

## 2014-04-10 MED ORDER — TORSEMIDE 20 MG PO TABS: 20.0000 mg | ORAL_TABLET | Freq: Two times a day (BID) | ORAL | Status: DC

## 2014-04-10 MED ORDER — POTASSIUM CHLORIDE CRYS ER 20 MEQ PO TBCR: 20.0000 meq | EXTENDED_RELEASE_TABLET | ORAL | Status: AC | PRN

## 2014-04-10 MED ORDER — METOLAZONE 5 MG PO TABS: 5.0000 mg | ORAL_TABLET | ORAL | Status: DC | PRN

## 2014-04-10 MED ORDER — TAMSULOSIN HCL 0.4 MG PO CAPS: 0.4000 mg | ORAL_CAPSULE | Freq: Every day | ORAL | Status: DC

## 2014-04-10 NOTE — Progress Notes (Signed)
Patient ID: Timothy Durham, male   DOB: 1931/01/04, 81 y.o.   MRN: [id number] PCP: Dr Luiz Iron Primary Cardiologist: Dr. Ladona Ridgel  HPI: Timothy Durham is an 81 yo male with a history of prior CVA in 2000 secondary to AF, NICM s/p CRT-P (02/17/14), chronic systolic HF, non-obstructive CAD, HTN, CKD stage III, HLD, PAD and bioprosthetic AVR. Timothy Durham has been admitted 3 times in the last couple of months for dyspnea that was related to volume overload.   Admitted to [facility] with volume overload. Diuresed with IV lasix and later transitioned to 40 mg torsemide twice a day. Overall Timothy Durham diuresed 10 pounds. Discharged weight was 152 pounds.   We saw him on 03/31/14. Timothy Durham was doing much better after cutting stopping his lisinopril/spiro and cutting torsemide to  daily and letting his weight range 158-162. Recently increased torsemide to 20 bid. Saw Dr. Antoine Poche on 04/04/14 and carvedilol doubled to 6.25 bid.  Felt fatigued for 2 days but then better. Yesterday afternoon went out for lunch and had a salad. About 3pm became SOB. No CP. They called clinic and Timothy Durham was instructed to take extra torsemide and Timothy Durham did not have much urine output. But had 4 soft BMs. Weight at home 163.4 in am and 165 in afternoon. HHRN gave him 80 IV lasix last night with good output. Weight this am 162.4.  Studies: RHC (01/14/2014): RA 12, RV 42/11 (16), PA 42/23 (25), PCWP 17, PA 61%, Fick CO/CI: 4.53/2.27 LHC (01/14/1014): moderate non-obstructive CAD with 50% CFX  Echo (12/2013): EF 30-35%, grade III DD, AV bioprosthetic valve nl, V max: 0.96 cm^2, mod/severe MR, RV sys. fx moderately reduced, TR mild  Labs:  03/10/2014  K 5.8 Cr 1.68 04/08/2014  K 4.0 Cr 1.60  ROS: All systems negative except as listed in HPI, PMH and Problem List.  SH:  History   Social History  . Marital Status: Married    Spouse Name: N/A    Number of Children: N/A  . Years of Education: N/A   Occupational History  . Not on file.   Social History Main Topics  .  Smoking status: Former Smoker    Quit date: 09/26/1998  . Smokeless tobacco: Never Used  . Alcohol Use: 0.6 oz/week    1 Cans of beer per week     Comment: drinks one can of beer daily.  . Drug Use: No  . Sexual Activity: Not on file   Other Topics Concern  . Not on file   Social History Narrative    FH:  Family History  Problem Relation Age of Onset  . Stroke Mother   . Cancer Father     Past Medical History  Diagnosis Date  . Arthritis   . Non-obstructive CAD     a. 12/2013 NSTEMI/Cath: LM nl, LAD 20, LCX 40-50, RCA 20.  Marland Kitchen Hypertension   . Stroke     a. July 2000;  b. January 2007;  c. TIA in 2014.  . CKD (chronic kidney disease), stage III   . GERD (gastroesophageal reflux disease)   . Foot pain, bilateral   . H/O hematuria   . Hyperlipidemia   . Vitamin D deficiency   . Chronic combined systolic and diastolic CHF (congestive heart failure)     a. 12/2013 Echo: EF 30-35%, mod conc LVH, Gr 3 DD, mild AI, mod-sev MR, mod dil LA, mild TR.  Marland Kitchen PAF (paroxysmal atrial fibrillation)     a. CHA2DS2VASc = 7--> Chronic Coumadin.  Marland Kitchen  NICM (nonischemic cardiomyopathy)     a. 12/2013 Echo: EF 30-35%.  . Aortic valve prosthesis present     a. 2006: S/P bioprosthetic AVR.  Marland Kitchen PAD (peripheral artery disease)     a. s/p LLE stenting.  . Carotid arterial disease     a. s/p R CEA  . History of tobacco abuse   . Moderate to Severe Mitral Regurgitation     a. 12/2013 Echo: Mod-Sev MR.  Marland Kitchen Anxiety   . COPD (chronic obstructive pulmonary disease)   . Coronary arteriosclerosis Oct 2015    med Rx    Current Outpatient Prescriptions  Medication Sig Dispense Refill  . acetaminophen (TYLENOL) 500 MG tablet Take 1,000 mg by mouth at bedtime.     Marland Kitchen allopurinol (ZYLOPRIM) 100 MG tablet Take 200 mg by mouth daily.    Marland Kitchen amiodarone (PACERONE) 200 MG tablet Take 1 tablet (200 mg total) by mouth daily. 90 tablet 3  . atorvastatin (LIPITOR) 40 MG tablet Take 40 mg by mouth every evening.     . Calcium Carb-Cholecalciferol (CALCIUM 600 + D PO) Take 2 tablets by mouth every morning.    . carvedilol (COREG) 6.25 MG tablet Take 1 tablet (6.25 mg total) by mouth 2 (two) times daily. 180 tablet 3  . clopidogrel (PLAVIX) 75 MG tablet Take 75 mg by mouth daily.    . DULoxetine (CYMBALTA) 60 MG capsule Take 60 mg by mouth daily.     . Omega-3 Fatty Acids (FISH OIL) 1200 MG CAPS Take 1 capsule by mouth every morning.    Marland Kitchen omeprazole (PRILOSEC) 40 MG capsule Take 40 mg by mouth every morning.    . tamsulosin (FLOMAX) 0.4 MG CAPS capsule Take 1 capsule (0.4 mg total) by mouth daily after supper. (Patient taking differently: Take 0.4 mg by mouth at bedtime. ) 90 capsule 3  . torsemide (DEMADEX) 20 MG tablet Take 1 tablet (20 mg total) by mouth daily. For weight greater than 158-162 lb.  If weight greater than 163 lb take 20 mg Twice daily (Patient taking differently: Take 20 mg by mouth 2 (two) times daily. For weight greater than 158-162 lb.  If weight greater than 163 lb take 20 mg Twice daily) 90 tablet 3  . warfarin (COUMADIN) 4 MG tablet Take 2-4 mg by mouth See admin instructions. Take 4 mg on Monday, Tuesday, Wednesday and 2 mg on Thursday, Friday, Saturday and Sunday     No current facility-administered medications for this encounter.    Filed Vitals:   04/10/14 0935  BP: 116/52  Pulse: 89  Weight: 165 lb 6.4 oz (75.025 kg)  SpO2: 94%    PHYSICAL EXAM: General:  Elderly appearing. No resp difficulty Timothy Durham present  HEENT: normal Neck: supple. JVP 6. Carotids 2+ bilaterally; no bruits. No lymphadenopathy or thryomegaly appreciated. Cor: PMI normal. Regular rate & rhythm. No rubs, gallops. 2/6 MR Lungs: clear with minimal crackles in bases.  Abdomen: soft, nontender, nondistended. No hepatosplenomegaly. No bruits or masses. Good bowel sounds. Extremities: no cyanosis, clubbing, rash, tr edema Neuro: alert & orientedx3, cranial nerves grossly intact. Moves all 4 extremities w/o  difficulty. Affect pleasant   ASSESSMENT & PLAN:  1) Chronic combined systolic/diastolic HF: NICM s/p CRT-P, EF 30-35%, grade III DD, mod/severe MR, RV sys fx mod reduced (12/2013) - Was fluid overloaded yesterday exceeding his 158-162 range. Unfortunately, Timothy Durham did not respond to 40 mg extra torsemide and needed IV lasix. Timothy Durham is improved today but weight still at the  high end of his range.  - Continue torsemide 20 bid - Will add back spiro 12.5 mg daily and give him metolazone 5mg  daily (with kcl 20) whenever weight is 163 or greater (or Timothy Durham is SOB) - Goal weight seems to be 158-162. - Will continue to hold lisinopril for now.  - Check BMET early next week. 2) Orthostatic Hypotension  - As noted above.   3) PAF- regular rhythm . Continue amiodarone 200 mg daily.  On coumadin and plavix daily (had TIA on coumadin). Off ASA. We discussed possible switch to NOAC but would be off-label due to bioprosthetic AVR.  4)  CKD stage III- stable 5) Gout- on Allopurinol.  6) Bioprosthetic AVR - stable on recent echo  Follow up 3-4 weeks.  Arvilla Meres MD  9:55 AM

## 2014-04-10 NOTE — Patient Instructions (Signed)
Torsemide 20 mg Twice daily   Spironolactone 12.5 mg (1/2 tab) daily  Take Metolazone 5 mg for weight of 163 lb or greater  Take Potassium 20 meq when you take Metolazone  Labs on Monday, [facility] will do  Your physician recommends that you schedule a follow-up appointment in: 2 weeks

## 2014-04-14 ENCOUNTER — Telehealth (HOSPITAL_COMMUNITY): Payer: Self-pay

## 2014-04-14 NOTE — Telephone Encounter (Signed)
Patient's HH RN with [facility] called for update on patient.  Weight down to 158lb 6 oz, not orthostatic, dizzy, or otherwise symptomatic.  Still SOB with exertion and has some fine crackle and diminished breath sound in RLL.  Advised to continue current medication regimen as recently prescribed from his visit with Dr. Gala Romney this past week.  Will check on patient tomorrow and see what his weight and status is at that time.  Ave Filter

## 2014-04-16 ENCOUNTER — Telehealth (HOSPITAL_COMMUNITY): Payer: Self-pay | Admitting: Cardiology

## 2014-04-16 NOTE — Telephone Encounter (Signed)
Wife called with concerns regarding decrease in weight at home.    spiro 12.5 mg daily ,torsemide 20 bid, metolazone 5mg  prn weight >163/SOB (with kcl 20) reports weight today is 156.8  On 1/14 weight was 162.4, 1/18 weight 162.8 metolazone given as pt was having increased sob and increased cough  Wife states he was having increased cough/dob/spitting up mucus, however this has resolved Wife did hold spiro 1/19, as she thought pt maybe getting too dry   Labs done by [facility] nurse 1/18  Should pt hold torsemide and spiro x 2 days and recheck labs 1/22?    Advised wife/pt will review labs done 01/18 and speak with NP/MD regarding decreased weights. Wife states she will hold torsemide and spiro until she hears from office

## 2014-04-16 NOTE — Telephone Encounter (Signed)
Per Verbal order Tonye Becket ,NP Pt should not hold meds, pt should continue meds as prescribed  spiro 12.5 mg daily ,torsemide 20 bid, metolazone 5mg  prn weight >163/SOB (with kcl 20) Re instructed to only give metolazone for weight greater than 163 and severe SOB   Pts wife voiced understanding

## 2014-04-21 ENCOUNTER — Other Ambulatory Visit (HOSPITAL_COMMUNITY): Payer: Self-pay

## 2014-04-21 ENCOUNTER — Telehealth (HOSPITAL_COMMUNITY): Payer: Self-pay | Admitting: Vascular Surgery

## 2014-04-21 ENCOUNTER — Encounter: Payer: Commercial Managed Care - HMO | Admitting: Cardiology

## 2014-04-21 MED ORDER — TORSEMIDE 20 MG PO TABS
20.0000 mg | ORAL_TABLET | Freq: Two times a day (BID) | ORAL | Status: DC
Start: 2014-04-21 — End: 2014-05-06

## 2014-04-21 NOTE — Telephone Encounter (Signed)
Patient needed refill sent in for torsemide 20mg  twice daily to [facility].  Sent.  Ave Filter

## 2014-04-21 NOTE — Telephone Encounter (Signed)
Pt wife left a message she wants to talk to someone that can help her with a mail order prescription.. Please advise

## 2014-04-21 NOTE — Telephone Encounter (Signed)
Pt wife left message she wants to speak to someone about a mail order prescription

## 2014-04-25 ENCOUNTER — Telehealth (HOSPITAL_COMMUNITY): Payer: Self-pay | Admitting: Vascular Surgery

## 2014-04-25 NOTE — Telephone Encounter (Signed)
Pt wife called she was out for a hour and pt called her to tell her he was having trouble breathing.. Please advise

## 2014-04-25 NOTE — Telephone Encounter (Signed)
Spoke w/pt's wife she states pt has c/o SOB this afternoon, she states she has talked to the RN with [facility] and they suggested he use the albuterol inhaler, she states he has had a bad sinus infection and has been on antibiotics and mucinex for 1 week, he does have a productive cough that has went from a clear white color to a yellow thick mucous, she did gave him albuterol which she doesn't feel like helped any.  She states he has no edema in Le at all and his abd does not seem to be swollen, wt 158.8, 160.2 yesterday, 158.8 today and this is where it usually runs, VS this AM: BP 129/55 HR 72 O2 92%, at 3 pm: BP 122/55, HR 80 02 90% pt is not on home oxygen.  She states pcp started doxycycline 100 mg Twice daily and mucinex all started 1/2, she states he has had no fever or chills, but generally just doesn't feel well and has c/o increased SOB since about 1 pm.  Advised does not sound to be related to heart failure and fluid need to f/u with pcp, she is agreeable and will call them

## 2014-04-28 ENCOUNTER — Ambulatory Visit (HOSPITAL_COMMUNITY)
Admission: RE | Admit: 2014-04-28 | Discharge: 2014-04-28 | Disposition: A | Discharge status: Home / Self Care | Payer: Medicare HMO | Source: Ambulatory Visit | Attending: Cardiology | Admitting: Cardiology

## 2014-04-28 ENCOUNTER — Encounter (HOSPITAL_COMMUNITY): Payer: Self-pay

## 2014-04-28 VITALS — BP 118/48 | HR 84 | Wt 157.0 lb

## 2014-04-28 DIAGNOSIS — M109 Gout, unspecified: Secondary | ICD-10-CM | POA: Diagnosis not present

## 2014-04-28 DIAGNOSIS — I480 Paroxysmal atrial fibrillation: Secondary | ICD-10-CM | POA: Diagnosis not present

## 2014-04-28 DIAGNOSIS — Z954 Presence of other heart-valve replacement: Secondary | ICD-10-CM | POA: Diagnosis not present

## 2014-04-28 DIAGNOSIS — Z7901 Long term (current) use of anticoagulants: Secondary | ICD-10-CM | POA: Insufficient documentation

## 2014-04-28 DIAGNOSIS — N183 Chronic kidney disease, stage 3 unspecified: Secondary | ICD-10-CM

## 2014-04-28 DIAGNOSIS — I5042 Chronic combined systolic (congestive) and diastolic (congestive) heart failure: Secondary | ICD-10-CM | POA: Insufficient documentation

## 2014-04-28 DIAGNOSIS — I5022 Chronic systolic (congestive) heart failure: Secondary | ICD-10-CM

## 2014-04-28 DIAGNOSIS — Z952 Presence of prosthetic heart valve: Secondary | ICD-10-CM

## 2014-04-28 DIAGNOSIS — I951 Orthostatic hypotension: Secondary | ICD-10-CM | POA: Diagnosis not present

## 2014-04-28 NOTE — Progress Notes (Signed)
Patient ID: Ezekeil Scholtes, male   DOB: Jan 18, 1931, 81 y.o.   MRN: [id number] PCP: Dr Luiz Iron Primary Cardiologist: Dr. Ladona Ridgel  HPI: Mr. Swaner is an 81 yo male with a history of prior CVA in 2000 secondary to AF, NICM s/p CRT-P (02/17/14), chronic systolic HF, non-obstructive CAD, HTN, CKD stage III, HLD, PAD and bioprosthetic AVR. He has been admitted 3 times in the last couple of months for dyspnea that was related to volume overload.   Admitted to [facility] with volume overload. Diuresed with IV lasix and later transitioned to 40 mg torsemide twice a day. Overall he diuresed 10 pounds. Discharged weight on 03/03/2014 was 152 pounds.   He returns for follow up. Last visit he was started on spiro and metolazone was added for weight >163 pounds. Overall he is doing ok. He has required one metolazone for increased dyspnea. Having a cough with yellow sputum. He saw his PCP on January 21 he was given muccinex and antibiotics. Complaining of fatigue and mild dyspnea with exertion. Denies PND/Orthopnea. Weight at home 154-160 pounds but most often he is 157 pounds. Taking all medications. [facility] following.   Studies: RHC (01/14/2014): RA 12, RV 42/11 (16), PA 42/23 (25), PCWP 17, PA 61%, Fick CO/CI: 4.53/2.27 LHC (01/14/1014): moderate non-obstructive CAD with 50% CFX  Echo (12/2013): EF 30-35%, grade III DD, AV bioprosthetic valve nl, V max: 0.96 cm^2, mod/severe MR, RV sys. fx moderately reduced, TR mild  Labs: 03/10/2014  K 5.8 Cr 1.68 04/08/2014  K 4.0 Cr 1.60 Na 136  ROS: All systems negative except as listed in HPI, PMH and Problem List.  SH:  History   Social History  . Marital Status: Married    Spouse Name: N/A    Number of Children: N/A  . Years of Education: N/A   Occupational History  . Not on file.   Social History Main Topics  . Smoking status: Former Smoker    Quit date: 09/26/1998  . Smokeless tobacco: Never Used  . Alcohol Use: 0.6 oz/week    1 Cans of beer per week     Comment:  drinks one can of beer daily.  . Drug Use: No  . Sexual Activity: Not on file   Other Topics Concern  . Not on file   Social History Narrative    FH:  Family History  Problem Relation Age of Onset  . Stroke Mother   . Cancer Father     Past Medical History  Diagnosis Date  . Arthritis   . Non-obstructive CAD     a. 12/2013 NSTEMI/Cath: LM nl, LAD 20, LCX 40-50, RCA 20.  Marland Kitchen Hypertension   . Stroke     a. July 2000;  b. January 2007;  c. TIA in 2014.  . CKD (chronic kidney disease), stage III   . GERD (gastroesophageal reflux disease)   . Foot pain, bilateral   . H/O hematuria   . Hyperlipidemia   . Vitamin D deficiency   . Chronic combined systolic and diastolic CHF (congestive heart failure)     a. 12/2013 Echo: EF 30-35%, mod conc LVH, Gr 3 DD, mild AI, mod-sev MR, mod dil LA, mild TR.  Marland Kitchen PAF (paroxysmal atrial fibrillation)     a. CHA2DS2VASc = 7--> Chronic Coumadin.  Marland Kitchen NICM (nonischemic cardiomyopathy)     a. 12/2013 Echo: EF 30-35%.  . Aortic valve prosthesis present     a. 2006: S/P bioprosthetic AVR.  Marland Kitchen PAD (peripheral artery disease)  a. s/p LLE stenting.  . Carotid arterial disease     a. s/p R CEA  . History of tobacco abuse   . Moderate to Severe Mitral Regurgitation     a. 12/2013 Echo: Mod-Sev MR.  Marland Kitchen Anxiety   . COPD (chronic obstructive pulmonary disease)   . Coronary arteriosclerosis Oct 2015    med Rx    Current Outpatient Prescriptions  Medication Sig Dispense Refill  . acetaminophen (TYLENOL) 500 MG tablet Take 1,000 mg by mouth at bedtime.     Marland Kitchen allopurinol (ZYLOPRIM) 100 MG tablet Take 200 mg by mouth daily.    Marland Kitchen amiodarone (PACERONE) 200 MG tablet Take 1 tablet (200 mg total) by mouth daily. 90 tablet 3  . atorvastatin (LIPITOR) 40 MG tablet Take 40 mg by mouth every evening.    . Calcium Carb-Cholecalciferol (CALCIUM 600 + D PO) Take 2 tablets by mouth every morning.    . carvedilol (COREG) 6.25 MG tablet Take 1 tablet (6.25 mg total)  by mouth 2 (two) times daily. 180 tablet 3  . clopidogrel (PLAVIX) 75 MG tablet Take 75 mg by mouth daily.    . DULoxetine (CYMBALTA) 60 MG capsule Take 60 mg by mouth daily.     . metolazone (ZAROXOLYN) 5 MG tablet Take 1 tablet (5 mg total) by mouth as needed. FOR WEIGHT 163 LB OR GREATER 30 tablet 3  . Omega-3 Fatty Acids (FISH OIL) 1200 MG CAPS Take 1 capsule by mouth every morning.    Marland Kitchen omeprazole (PRILOSEC) 40 MG capsule Take 40 mg by mouth every morning.    . potassium chloride SA (K-DUR,KLOR-CON) 20 MEQ tablet Take 1 tablet (20 mEq total) by mouth as needed. TAKE ONLY WHEN YOU TAKE METOLAZONE 30 tablet 3  . spironolactone (ALDACTONE) 25 MG tablet Take 0.5 tablets (12.5 mg total) by mouth daily. 90 tablet 3  . torsemide (DEMADEX) 20 MG tablet Take 1 tablet (20 mg total) by mouth 2 (two) times daily. 90 tablet 3  . warfarin (COUMADIN) 4 MG tablet Take 2-4 mg by mouth See admin instructions. Take 4 mg on Monday, Tuesday, Wednesday and 2 mg on Thursday, Friday, Saturday and Sunday    . tamsulosin (FLOMAX) 0.4 MG CAPS capsule Take 1 capsule (0.4 mg total) by mouth at bedtime. 90 capsule 3   No current facility-administered medications for this encounter.    Filed Vitals:   04/28/14 1355  BP: 118/48  Pulse: 84  Weight: 157 lb (71.215 kg)  SpO2: 96%    PHYSICAL EXAM: General:  Elderly appearing. No resp difficulty Wife present  HEENT: normal Neck: supple. JVP 5-6. Carotids 2+ bilaterally; no bruits. No lymphadenopathy or thryomegaly appreciated. Cor: PMI normal. Regular rate & rhythm. No rubs, gallops. 2/6 MR Lungs: clear with minimal crackles in bases.  Abdomen: soft, nontender, nondistended. No hepatosplenomegaly. No bruits or masses. Good bowel sounds. Extremities: no cyanosis, clubbing, rash, edema Neuro: alert & orientedx3, cranial nerves grossly intact. Moves all 4 extremities w/o difficulty. Affect pleasant   ASSESSMENT & PLAN:  1) Chronic combined systolic/diastolic HF:  NICM s/p CRT-P, EF 30-35%, grade III DD, mod/severe MR, RV sys fx mod reduced (12/2013). NYHA IIIB -- Continue torsemide 20 bid with metolazone as needed for weight 163 pounds or > and if he has increased dyspnea.  -Continue current dose of potassium.   - Continue spiro 12.5 mg daily  - Goal weight seems to be 158-162. - Will continue to hold lisinopril for now.  - Check  BMET tomorrow by [facility] tomorrow.   2) Orthostatic Hypotension  - Resolved.    3) PAF- CHADVASC score = 5. regular rhythm . Continue amiodarone 200 mg daily.  On coumadin and plavix daily (had TIA on coumadin). Off ASA.  4)  CKD stage III- stable 5) Gout- on Allopurinol.  6) Bioprosthetic AVR - stable on recent echo. INR managed by PCP.    Follow up 4 weeks.   CLEGG,AMY NP-C  2:07 PM

## 2014-04-28 NOTE — Patient Instructions (Signed)
[facility] will draw labs tomorrow  Your physician recommends that you schedule a follow-up appointment in: 4 weeks

## 2014-04-29 ENCOUNTER — Telehealth: Payer: Self-pay | Admitting: Cardiology

## 2014-04-29 NOTE — Telephone Encounter (Signed)
According to the chart, the patient was seen by the [facility] yesterday. Order given for BMP to be drawn today and results be sent to dr [email or web address].

## 2014-05-04 ENCOUNTER — Encounter (HOSPITAL_BASED_OUTPATIENT_CLINIC_OR_DEPARTMENT_OTHER): Payer: Self-pay | Admitting: *Deleted

## 2014-05-04 ENCOUNTER — Inpatient Hospital Stay (HOSPITAL_BASED_OUTPATIENT_CLINIC_OR_DEPARTMENT_OTHER)
Admission: EM | Admit: 2014-05-04 | Discharge: 2014-05-06 | DRG: 292 | Disposition: A | Discharge status: Home Health | Payer: Commercial Managed Care - HMO | Attending: Emergency Medicine | Admitting: Emergency Medicine

## 2014-05-04 ENCOUNTER — Emergency Department (HOSPITAL_BASED_OUTPATIENT_CLINIC_OR_DEPARTMENT_OTHER): Payer: Commercial Managed Care - HMO

## 2014-05-04 DIAGNOSIS — K219 Gastro-esophageal reflux disease without esophagitis: Secondary | ICD-10-CM | POA: Diagnosis present

## 2014-05-04 DIAGNOSIS — N183 Chronic kidney disease, stage 3 unspecified: Secondary | ICD-10-CM | POA: Diagnosis present

## 2014-05-04 DIAGNOSIS — I340 Nonrheumatic mitral (valve) insufficiency: Secondary | ICD-10-CM | POA: Diagnosis present

## 2014-05-04 DIAGNOSIS — I2510 Atherosclerotic heart disease of native coronary artery without angina pectoris: Secondary | ICD-10-CM | POA: Diagnosis present

## 2014-05-04 DIAGNOSIS — Z7902 Long term (current) use of antithrombotics/antiplatelets: Secondary | ICD-10-CM

## 2014-05-04 DIAGNOSIS — M109 Gout, unspecified: Secondary | ICD-10-CM | POA: Diagnosis present

## 2014-05-04 DIAGNOSIS — I480 Paroxysmal atrial fibrillation: Secondary | ICD-10-CM | POA: Diagnosis present

## 2014-05-04 DIAGNOSIS — Z79899 Other long term (current) drug therapy: Secondary | ICD-10-CM | POA: Diagnosis not present

## 2014-05-04 DIAGNOSIS — I5043 Acute on chronic combined systolic (congestive) and diastolic (congestive) heart failure: Secondary | ICD-10-CM | POA: Diagnosis not present

## 2014-05-04 DIAGNOSIS — I252 Old myocardial infarction: Secondary | ICD-10-CM | POA: Diagnosis not present

## 2014-05-04 DIAGNOSIS — R0602 Shortness of breath: Secondary | ICD-10-CM | POA: Diagnosis not present

## 2014-05-04 DIAGNOSIS — I428 Other cardiomyopathies: Secondary | ICD-10-CM | POA: Diagnosis present

## 2014-05-04 DIAGNOSIS — I129 Hypertensive chronic kidney disease with stage 1 through stage 4 chronic kidney disease, or unspecified chronic kidney disease: Secondary | ICD-10-CM | POA: Diagnosis present

## 2014-05-04 DIAGNOSIS — Z8673 Personal history of transient ischemic attack (TIA), and cerebral infarction without residual deficits: Secondary | ICD-10-CM | POA: Diagnosis not present

## 2014-05-04 DIAGNOSIS — Z7901 Long term (current) use of anticoagulants: Secondary | ICD-10-CM

## 2014-05-04 DIAGNOSIS — R0902 Hypoxemia: Secondary | ICD-10-CM | POA: Diagnosis present

## 2014-05-04 DIAGNOSIS — E785 Hyperlipidemia, unspecified: Secondary | ICD-10-CM | POA: Diagnosis present

## 2014-05-04 DIAGNOSIS — Z87891 Personal history of nicotine dependence: Secondary | ICD-10-CM

## 2014-05-04 DIAGNOSIS — I5021 Acute systolic (congestive) heart failure: Secondary | ICD-10-CM

## 2014-05-04 DIAGNOSIS — Z954 Presence of other heart-valve replacement: Secondary | ICD-10-CM

## 2014-05-04 DIAGNOSIS — Z951 Presence of aortocoronary bypass graft: Secondary | ICD-10-CM

## 2014-05-04 DIAGNOSIS — I4891 Unspecified atrial fibrillation: Secondary | ICD-10-CM | POA: Diagnosis not present

## 2014-05-04 DIAGNOSIS — I509 Heart failure, unspecified: Secondary | ICD-10-CM

## 2014-05-04 DIAGNOSIS — J449 Chronic obstructive pulmonary disease, unspecified: Secondary | ICD-10-CM | POA: Diagnosis present

## 2014-05-04 DIAGNOSIS — I5023 Acute on chronic systolic (congestive) heart failure: Secondary | ICD-10-CM

## 2014-05-04 DIAGNOSIS — J810 Acute pulmonary edema: Secondary | ICD-10-CM | POA: Diagnosis not present

## 2014-05-04 LAB — BASIC METABOLIC PANEL
ANION GAP: 7 (ref 5–15)
BUN: 43 mg/dL — ABNORMAL HIGH (ref 6–23)
CO2: 29 mmol/L (ref 19–32)
CREATININE: 1.79 mg/dL — AB (ref 0.50–1.35)
Calcium: 8.7 mg/dL (ref 8.4–10.5)
Chloride: 94 mmol/L — ABNORMAL LOW (ref 96–112)
GFR calc Af Amer: 39 mL/min — ABNORMAL LOW (ref >90)
GFR calc non Af Amer: 34 mL/min — ABNORMAL LOW (ref >90)
Glucose, Bld: 120 mg/dL — ABNORMAL HIGH (ref 70–99)
Potassium: 4.3 mmol/L (ref 3.5–5.1)
Sodium: 130 mmol/L — ABNORMAL LOW (ref 135–145)

## 2014-05-04 LAB — HEPATIC FUNCTION PANEL
ALBUMIN: 3.7 g/dL (ref 3.5–5.2)
ALT: 13 U/L (ref 0–53)
AST: 21 U/L (ref 0–37)
Alkaline Phosphatase: 83 U/L (ref 39–117)
BILIRUBIN TOTAL: 1.1 mg/dL (ref 0.3–1.2)
Bilirubin, Direct: 0.3 mg/dL (ref 0.0–0.5)
Indirect Bilirubin: 0.8 mg/dL (ref 0.3–0.9)
Total Protein: 6.6 g/dL (ref 6.0–8.3)

## 2014-05-04 LAB — MRSA PCR SCREENING: MRSA by PCR: NEGATIVE

## 2014-05-04 LAB — CBC
HCT: 39.1 % (ref 39.0–52.0)
Hemoglobin: 13.2 g/dL (ref 13.0–17.0)
MCH: 33.3 pg (ref 26.0–34.0)
MCHC: 33.8 g/dL (ref 30.0–36.0)
MCV: 98.7 fL (ref 78.0–100.0)
Platelets: 263 10*3/uL (ref 150–400)
RBC: 3.96 MIL/uL — ABNORMAL LOW (ref 4.22–5.81)
RDW: 13.9 % (ref 11.5–15.5)
WBC: 8.1 10*3/uL (ref 4.0–10.5)

## 2014-05-04 LAB — TROPONIN I: Troponin I: <0.03 ng/mL (ref <0.031)

## 2014-05-04 LAB — PROTIME-INR
INR: 3.38 — ABNORMAL HIGH (ref 0.00–1.49)
PROTHROMBIN TIME: 34.2 s — AB (ref 11.6–15.2)

## 2014-05-04 LAB — BRAIN NATRIURETIC PEPTIDE: B Natriuretic Peptide: 1013 pg/mL — ABNORMAL HIGH (ref 0.0–100.0)

## 2014-05-04 MED ORDER — FUROSEMIDE 10 MG/ML IJ SOLN
40.0000 mg | Freq: Two times a day (BID) | INTRAMUSCULAR | Status: DC
  Administered 2014-05-05 – 2014-05-06 (×3): 40 mg via INTRAVENOUS
  Filled 2014-05-04 (×6): qty 4

## 2014-05-04 MED ORDER — ONDANSETRON HCL 4 MG/2ML IJ SOLN: 4.0000 mg | Freq: Four times a day (QID) | INTRAMUSCULAR | Status: DC | PRN

## 2014-05-04 MED ORDER — SODIUM CHLORIDE 0.9 % IV SOLN: 250.0000 mL | INTRAVENOUS | Status: DC | PRN

## 2014-05-04 MED ORDER — OMEGA-3-ACID ETHYL ESTERS 1 G PO CAPS
1.0000 g | ORAL_CAPSULE | Freq: Every day | ORAL | Status: DC
  Administered 2014-05-05 – 2014-05-06 (×2): 1 g via ORAL
  Filled 2014-05-04 (×3): qty 1

## 2014-05-04 MED ORDER — SODIUM CHLORIDE 0.9 % IJ SOLN: 3.0000 mL | INTRAMUSCULAR | Status: DC | PRN

## 2014-05-04 MED ORDER — CARVEDILOL 6.25 MG PO TABS
6.2500 mg | ORAL_TABLET | Freq: Two times a day (BID) | ORAL | Status: DC
  Administered 2014-05-04 – 2014-05-06 (×4): 6.25 mg via ORAL
  Filled 2014-05-04 (×7): qty 1

## 2014-05-04 MED ORDER — ALLOPURINOL 100 MG PO TABS
200.0000 mg | ORAL_TABLET | Freq: Every day | ORAL | Status: DC
  Administered 2014-05-05 – 2014-05-06 (×2): 200 mg via ORAL
  Filled 2014-05-04 (×3): qty 2

## 2014-05-04 MED ORDER — FUROSEMIDE 10 MG/ML IJ SOLN: 60.0000 mg | Freq: Two times a day (BID) | INTRAMUSCULAR | Status: DC

## 2014-05-04 MED ORDER — SPIRONOLACTONE 12.5 MG HALF TABLET
12.5000 mg | ORAL_TABLET | Freq: Every day | ORAL | Status: DC
  Administered 2014-05-05 – 2014-05-06 (×2): 12.5 mg via ORAL
  Filled 2014-05-04 (×2): qty 1

## 2014-05-04 MED ORDER — AMIODARONE HCL 200 MG PO TABS
200.0000 mg | ORAL_TABLET | Freq: Every day | ORAL | Status: DC
  Administered 2014-05-05 – 2014-05-06 (×2): 200 mg via ORAL
  Filled 2014-05-04 (×3): qty 1

## 2014-05-04 MED ORDER — ACETAMINOPHEN 500 MG PO TABS
1000.0000 mg | ORAL_TABLET | Freq: Every day | ORAL | Status: DC
  Administered 2014-05-04 – 2014-05-05 (×2): 1000 mg via ORAL
  Filled 2014-05-04 (×4): qty 2

## 2014-05-04 MED ORDER — ALBUTEROL SULFATE (2.5 MG/3ML) 0.083% IN NEBU
5.0000 mg | INHALATION_SOLUTION | Freq: Once | RESPIRATORY_TRACT | Status: AC
  Administered 2014-05-04: 5 mg via RESPIRATORY_TRACT
  Filled 2014-05-04: qty 6

## 2014-05-04 MED ORDER — DULOXETINE HCL 60 MG PO CPEP
60.0000 mg | ORAL_CAPSULE | Freq: Every day | ORAL | Status: DC
  Administered 2014-05-05 – 2014-05-06 (×2): 60 mg via ORAL
  Filled 2014-05-04 (×3): qty 1

## 2014-05-04 MED ORDER — ACETAMINOPHEN 325 MG PO TABS: 650.0000 mg | ORAL_TABLET | ORAL | Status: DC | PRN

## 2014-05-04 MED ORDER — FUROSEMIDE 10 MG/ML IJ SOLN
40.0000 mg | Freq: Once | INTRAMUSCULAR | Status: AC
  Administered 2014-05-04: 40 mg via INTRAVENOUS
  Filled 2014-05-04: qty 4

## 2014-05-04 MED ORDER — PANTOPRAZOLE SODIUM 40 MG PO TBEC
40.0000 mg | DELAYED_RELEASE_TABLET | Freq: Every day | ORAL | Status: DC
  Administered 2014-05-05 – 2014-05-06 (×2): 40 mg via ORAL
  Filled 2014-05-04 (×2): qty 1

## 2014-05-04 MED ORDER — ATORVASTATIN CALCIUM 40 MG PO TABS
40.0000 mg | ORAL_TABLET | Freq: Every evening | ORAL | Status: DC
  Administered 2014-05-04 – 2014-05-05 (×2): 40 mg via ORAL
  Filled 2014-05-04 (×3): qty 1

## 2014-05-04 MED ORDER — CLOPIDOGREL BISULFATE 75 MG PO TABS
75.0000 mg | ORAL_TABLET | Freq: Every day | ORAL | Status: DC
  Administered 2014-05-05 – 2014-05-06 (×2): 75 mg via ORAL
  Filled 2014-05-04 (×3): qty 1

## 2014-05-04 MED ORDER — SODIUM CHLORIDE 0.9 % IJ SOLN
3.0000 mL | Freq: Two times a day (BID) | INTRAMUSCULAR | Status: DC
  Administered 2014-05-04 – 2014-05-06 (×4): 3 mL via INTRAVENOUS

## 2014-05-04 NOTE — ED Provider Notes (Signed)
CSN: [id number]     Arrival date & time 05/04/14  1645 History  This chart was scribed for Richardean Canal, MD by Gwenyth Ober, ED Scribe. This patient was seen in room MH05/MH05 and the patient's care was started at 5:06 PM.    Chief Complaint  Patient presents with  . Shortness of Breath   The history is provided by a relative. No language interpreter was used.    HPI Comments: Timothy Durham is a 81 y.o. male with a history of CHF, brought in by his daughter, who presents to the Emergency Department complaining of constant, moderate SOB that started around 1 pm. His daughter states increased shortness of breath this afternoon that is getting worse. Also has increased fatigue, intermittent productive cough, decreased urination and abdominal breathing as associated symptoms. She administered Metolazone with no relief. Pt was last seen at heart failure clinic 1 week ago. His daughter reports he recently completed 10-day course of an unspecified antibiotic. Pt was admitted to heart failure service in January and was diuresed. Pt is also on Coumadin because he has history of CVA. Pt is not on O2 at home.   PCP Luiz Iron  Past Medical History  Diagnosis Date  . Arthritis   . Non-obstructive CAD     a. 12/2013 NSTEMI/Cath: LM nl, LAD 20, LCX 40-50, RCA 20.  Marland Kitchen Hypertension   . Stroke     a. July 2000;  b. January 2007;  c. TIA in 2014.  . CKD (chronic kidney disease), stage III   . GERD (gastroesophageal reflux disease)   . Foot pain, bilateral   . H/O hematuria   . Hyperlipidemia   . Vitamin D deficiency   . Chronic combined systolic and diastolic CHF (congestive heart failure)     a. 12/2013 Echo: EF 30-35%, mod conc LVH, Gr 3 DD, mild AI, mod-sev MR, mod dil LA, mild TR.  Marland Kitchen PAF (paroxysmal atrial fibrillation)     a. CHA2DS2VASc = 7--> Chronic Coumadin.  Marland Kitchen NICM (nonischemic cardiomyopathy)     a. 12/2013 Echo: EF 30-35%.  . Aortic valve prosthesis present     a. 2006: S/P bioprosthetic AVR.   Marland Kitchen PAD (peripheral artery disease)     a. s/p LLE stenting.  . Carotid arterial disease     a. s/p R CEA  . History of tobacco abuse   . Moderate to Severe Mitral Regurgitation     a. 12/2013 Echo: Mod-Sev MR.  Marland Kitchen Anxiety   . COPD (chronic obstructive pulmonary disease)   . Coronary arteriosclerosis Oct 2015    med Rx   Past Surgical History  Procedure Laterality Date  . Aortic valve replacement (avr)/coronary artery bypass grafting (cabg)  12/2008  . Eye surgery    . Carotid angiogram  1996  . Left lower extremity stent    . Carotid endarterectomy    . Insert / replace / remove pacemaker    . Coronary angiogram  Oct 2015  . Pacemaker insertion  Nov 2015    MDT BiV  . Left and right heart catheterization with coronary angiogram N/A 01/14/2014    Procedure: LEFT AND RIGHT HEART CATHETERIZATION WITH CORONARY ANGIOGRAM;  Surgeon: Peter M Swaziland, MD;  Location: [facility] CATH LAB;  Service: Cardiovascular;  Laterality: N/A;  . Bi-ventricular pacemaker insertion N/A 02/17/2014    Procedure: BI-VENTRICULAR PACEMAKER INSERTION (CRT-P);  Surgeon: Marinus Maw, MD;  Location: [facility] CATH LAB;  Service: Cardiovascular;  Laterality: N/A;   Family History  Problem Relation Age of Onset  . Stroke Mother   . Cancer Father    History  Substance Use Topics  . Smoking status: Former Smoker    Quit date: 09/26/1998  . Smokeless tobacco: Never Used  . Alcohol Use: 0.6 oz/week    1 Cans of beer per week     Comment: drinks one can of beer daily.    Review of Systems  Constitutional: Positive for fatigue.  Respiratory: Positive for cough and shortness of breath.   Genitourinary: Positive for decreased urine volume.  All other systems reviewed and are negative.     Allergies  Ambien; Ativan; Oxycontin; Penicillins; and Sulfa antibiotics  Home Medications   Prior to Admission medications   Medication Sig Start Date End Date Taking? Authorizing Provider  acetaminophen (TYLENOL) 500 MG  tablet Take 1,000 mg by mouth at bedtime.    Yes Historical Provider, MD  allopurinol (ZYLOPRIM) 100 MG tablet Take 200 mg by mouth daily.   Yes Historical Provider, MD  amiodarone (PACERONE) 200 MG tablet Take 1 tablet (200 mg total) by mouth daily. 03/10/14  Yes Dolores Patty, MD  atorvastatin (LIPITOR) 40 MG tablet Take 40 mg by mouth every evening.   Yes Historical Provider, MD  Calcium Carb-Cholecalciferol (CALCIUM 600 + D PO) Take 2 tablets by mouth every morning.   Yes Historical Provider, MD  carvedilol (COREG) 6.25 MG tablet Take 1 tablet (6.25 mg total) by mouth 2 (two) times daily. 04/04/14  Yes Rollene Rotunda, MD  clopidogrel (PLAVIX) 75 MG tablet Take 75 mg by mouth daily.   Yes Historical Provider, MD  DULoxetine (CYMBALTA) 60 MG capsule Take 60 mg by mouth daily.  03/18/14  Yes Historical Provider, MD  metolazone (ZAROXOLYN) 5 MG tablet Take 1 tablet (5 mg total) by mouth as needed. FOR WEIGHT 163 LB OR GREATER 04/10/14  Yes Dolores Patty, MD  Omega-3 Fatty Acids (FISH OIL) 1200 MG CAPS Take 1 capsule by mouth every morning.   Yes Historical Provider, MD  omeprazole (PRILOSEC) 40 MG capsule Take 40 mg by mouth every morning.   Yes Historical Provider, MD  potassium chloride SA (K-DUR,KLOR-CON) 20 MEQ tablet Take 1 tablet (20 mEq total) by mouth as needed. TAKE ONLY WHEN YOU TAKE METOLAZONE 04/10/14  Yes Dolores Patty, MD  spironolactone (ALDACTONE) 25 MG tablet Take 0.5 tablets (12.5 mg total) by mouth daily. 04/10/14  Yes Dolores Patty, MD  tamsulosin (FLOMAX) 0.4 MG CAPS capsule Take 1 capsule (0.4 mg total) by mouth at bedtime. 04/10/14  Yes Dolores Patty, MD  torsemide (DEMADEX) 20 MG tablet Take 1 tablet (20 mg total) by mouth 2 (two) times daily. 04/21/14  Yes Dolores Patty, MD  warfarin (COUMADIN) 4 MG tablet Take 2-4 mg by mouth See admin instructions. Take 4 mg on Monday, Tuesday, Wednesday and 2 mg on Thursday, Friday, Saturday and Sunday 02/24/13  Yes  Historical Provider, MD   BP 120/39 mmHg  Pulse 71  Resp 23  Ht  (1.778 m)  Wt 155 lb (70.308 kg)  BMI 22.24 kg/m2  SpO2 97% Physical Exam  Constitutional: He appears well-developed and well-nourished. No distress.  HENT:  Head: Normocephalic and atraumatic.  Eyes: Conjunctivae and EOM are normal.  Neck: Neck supple. No tracheal deviation present.  Cardiovascular: Normal rate.   Pulmonary/Chest: Effort normal. No respiratory distress.  Tachypnic with retractions and abdominal breathing; crackles at bilateral bases. + JVD.  Abdominal: Soft. There is no  tenderness.  Musculoskeletal:  1+ edema bilateral lower extremities  Skin: Skin is warm and dry.  Psychiatric: He has a normal mood and affect. His behavior is normal.  Nursing note and vitals reviewed.   ED Course  Procedures (including critical care time) DIAGNOSTIC STUDIES: Oxygen Saturation is 99% on [place] 2.5 L/min, normal by my interpretation.    COORDINATION OF CARE: 5:17 PM Discussed treatment plan with pt's daughter at bedside and she agreed to plan.   Labs Review Labs Reviewed  CBC - Abnormal; Notable for the following:    RBC 3.96 (*)    All other components within normal limits  BASIC METABOLIC PANEL - Abnormal; Notable for the following:    Sodium 130 (*)    Chloride 94 (*)    Glucose, Bld 120 (*)    BUN 43 (*)    Creatinine, Ser 1.79 (*)    GFR calc non Af Amer 34 (*)    GFR calc Af Amer 39 (*)    All other components within normal limits  BRAIN NATRIURETIC PEPTIDE - Abnormal; Notable for the following:    B Natriuretic Peptide 1013.0 (*)    All other components within normal limits  PROTIME-INR - Abnormal; Notable for the following:    Prothrombin Time 34.2 (*)    INR 3.38 (*)    All other components within normal limits  TROPONIN I  HEPATIC FUNCTION PANEL    Imaging Review Dg Chest Port 1 View  05/04/2014   CLINICAL DATA:  Increased shortness of breath since this afternoon. Not able to  urinate. History of hypertension, stroke, Coronary artery disease, COPD, pacemaker.  EXAM: PORTABLE CHEST - 1 VIEW  COMPARISON:  03/01/2014  FINDINGS: Postoperative changes in the mediastinum. Cardiac pacemaker. Shallow inspiration with atelectasis in the lung bases. Cardiac enlargement, increasing since prior study. Developing pulmonary vascular congestion and perihilar edema. Small bilateral pleural effusions are increasing since prior study. Fluid in the right major fissure again demonstrated. No pneumothorax. Calcification of the aorta.  IMPRESSION: Increasing heart size, pulmonary vascular congestion, and edema since prior study with small but increasing pleural effusions and basilar atelectasis. Fluid in the right minor fissure again demonstrated.   Electronically Signed   By: Burman Nieves M.D.   On: 05/04/2014 17:39     EKG Interpretation   Date/Time:  Sunday May 04 2014 16:54:40 EST Ventricular Rate:  88 PR Interval:    QRS Duration: 156 QT Interval:  446 QTC Calculation: 539 R Axis:   -105 Text Interpretation:  Ventricular-paced rhythm Abnormal ECG No significant  change since last tracing Confirmed by YAO  MD, DAVID ([id number]) on 05/04/2014  5:04:58 PM      MDM   Final diagnoses:  None   Timothy Durham is a 81 y.o. male here with SOB, cough. Likely acute pulmonary edema. Hypoxic to 83% on RA on arrival. With 2L, around 95%. CXR showed pulmonary vascular congestion. BNP 1000. Given lasix 40 mg and able to urinate. Will admit to [facility].   I personally performed the services described in this documentation, which was scribed in my presence. The recorded information has been reviewed and is accurate.    Richardean Canal, MD 05/04/14 [id number]

## 2014-05-04 NOTE — ED Notes (Addendum)
C/o Shortness of breath since 1420 today with accessory muscles to breathe and pt unable to urinate. Wife states Pt MD has told her to give pt extra medication when this happens. Coughing up a lot yellow/clear muscus in past few days. On arrival pt very pale and using accessory muscles to breath. Decreased breath sds with inspiration. Rhonchi x 2.

## 2014-05-04 NOTE — Progress Notes (Signed)
ANTICOAGULATION CONSULT NOTE - Initial Consult  Pharmacy Consult for coumadin Indication: atrial fibrillation  Allergies  Allergen Reactions  . Ambien [Zolpidem] Other (See Comments)    Hallucinations  . Ativan [Lorazepam] Other (See Comments)    hallucinations  . Oxycontin [Oxycodone] Other (See Comments)    hallucinations  . Penicillins     Unknown childhood reaction   . Sulfa Antibiotics     unknown    Patient Measurements: Height:  (177.8 cm) Weight: 155 lb (70.308 kg) IBW/kg (Calculated) : 73   Vital Signs: BP: 141/45 mmHg (02/07 2053) Pulse Rate: 80 (02/07 1957)  Labs:  Recent Labs  05/04/14 1700  HGB 13.2  HCT 39.1  PLT 263  LABPROT 34.2*  INR 3.38*  CREATININE 1.79*  TROPONINI <0.03    Estimated Creatinine Clearance: 32.2 mL/min (by C-G formula based on Cr of 1.79).   Medical History: Past Medical History  Diagnosis Date  . Arthritis   . Non-obstructive CAD     a. 12/2013 NSTEMI/Cath: LM nl, LAD 20, LCX 40-50, RCA 20.  Marland Kitchen Hypertension   . Stroke     a. July 2000;  b. January 2007;  c. TIA in 2014.  . CKD (chronic kidney disease), stage III   . GERD (gastroesophageal reflux disease)   . Foot pain, bilateral   . H/O hematuria   . Hyperlipidemia   . Vitamin D deficiency   . Chronic combined systolic and diastolic CHF (congestive heart failure)     a. 12/2013 Echo: EF 30-35%, mod conc LVH, Gr 3 DD, mild AI, mod-sev MR, mod dil LA, mild TR.  Marland Kitchen PAF (paroxysmal atrial fibrillation)     a. CHA2DS2VASc = 7--> Chronic Coumadin.  Marland Kitchen NICM (nonischemic cardiomyopathy)     a. 12/2013 Echo: EF 30-35%.  . Aortic valve prosthesis present     a. 2006: S/P bioprosthetic AVR.  Marland Kitchen PAD (peripheral artery disease)     a. s/p LLE stenting.  . Carotid arterial disease     a. s/p R CEA  . History of tobacco abuse   . Moderate to Severe Mitral Regurgitation     a. 12/2013 Echo: Mod-Sev MR.  Marland Kitchen Anxiety   . COPD (chronic obstructive pulmonary disease)   .  Coronary arteriosclerosis Oct 2015    med Rx    Medications:  Prescriptions prior to admission  Medication Sig Dispense Refill Last Dose  . acetaminophen (TYLENOL) 500 MG tablet Take 1,000 mg by mouth at bedtime.    Taking  . allopurinol (ZYLOPRIM) 100 MG tablet Take 200 mg by mouth daily.   Taking  . amiodarone (PACERONE) 200 MG tablet Take 1 tablet (200 mg total) by mouth daily. 90 tablet 3 Taking  . atorvastatin (LIPITOR) 40 MG tablet Take 40 mg by mouth every evening.   Taking  . Calcium Carb-Cholecalciferol (CALCIUM 600 + D PO) Take 2 tablets by mouth every morning.   Taking  . carvedilol (COREG) 6.25 MG tablet Take 1 tablet (6.25 mg total) by mouth 2 (two) times daily. 180 tablet 3 Taking  . clopidogrel (PLAVIX) 75 MG tablet Take 75 mg by mouth daily.   Taking  . DULoxetine (CYMBALTA) 60 MG capsule Take 60 mg by mouth daily.    Taking  . metolazone (ZAROXOLYN) 5 MG tablet Take 1 tablet (5 mg total) by mouth as needed. FOR WEIGHT 163 LB OR GREATER 30 tablet 3 Taking  . Omega-3 Fatty Acids (FISH OIL) 1200 MG CAPS Take 1 capsule by  mouth every morning.   Taking  . omeprazole (PRILOSEC) 40 MG capsule Take 40 mg by mouth every morning.   Taking  . potassium chloride SA (K-DUR,KLOR-CON) 20 MEQ tablet Take 1 tablet (20 mEq total) by mouth as needed. TAKE ONLY WHEN YOU TAKE METOLAZONE 30 tablet 3 Taking  . spironolactone (ALDACTONE) 25 MG tablet Take 0.5 tablets (12.5 mg total) by mouth daily. 90 tablet 3 Taking  . tamsulosin (FLOMAX) 0.4 MG CAPS capsule Take 1 capsule (0.4 mg total) by mouth at bedtime. 90 capsule 3   . torsemide (DEMADEX) 20 MG tablet Take 1 tablet (20 mg total) by mouth 2 (two) times daily. 90 tablet 3 Taking  . warfarin (COUMADIN) 4 MG tablet Take 2-4 mg by mouth See admin instructions. Take 4 mg on Monday, Tuesday, Wednesday and 2 mg on Thursday, Friday, Saturday and Sunday   Taking    Assessment: 81 yo man to continue coumadin for afib.  His INR is supratherapeutic.    Goal of Therapy:  INR 2-3 Monitor platelets by anticoagulation protocol: Yes   Plan:  No coumadin tonight Daily PT/INR  Thanks for allowing pharmacy to be a part of this patient's care.  Talbert Cage, PharmD Clinical Pharmacist, [phone]  05/04/2014,9:08 PM

## 2014-05-04 NOTE — H&P (Addendum)
Timothy Durham is an 81 y.o. male.    Chief Complaint: shortness of breath HPI: Timothy Durham is an 81 yo man with systolic and diastolic heart failure, stage III CKD, non-obstructive CAD, dyslipidemia, atrial fibrillation on chronic anticoagulation with warfarin, bioprosthetic AV from '06, PAD with previous LLE stents, moderate to severe MR who presented to [facility] with shortness of breath and found to have right > left pulmonary edema. He had worsening shortness of breath this afternoon that progressed leading his daughter to brin ghim in. He also has had an associated productive cough (? Improved). He took an extra metolazone without improvement. He has recently finished a 10 day antibiotic course and was seen 04/28/14 in our HF clinic. He was also noted to be 78% on room air on arrival with improvement to 95% with 2L oxygen. He is accompanied by his wife and she says there are no overt triggers or changes in diet. He's been compliant with all of his medications and didn't miss anything this AM. He is actually losing weight, muscle/strength and is down to 154 lbs.  He was transferred to [facility] for admission for acute on chronic heart failure after receiving 40 mg IV lasix there with 350 ml output. Here he had an additional 500 ml out. He feels much improved. He had several ok days and then several days where he is more fatigued/tired. No sick contacts.   Past Medical History  Diagnosis Date  . Arthritis   . Non-obstructive CAD     a. 12/2013 NSTEMI/Cath: LM nl, LAD 20, LCX 40-50, RCA 20.  Marland Kitchen Hypertension   . Stroke     a. July 2000;  b. January 2007;  c. TIA in 2014.  . CKD (chronic kidney disease), stage III   . GERD (gastroesophageal reflux disease)   . Foot pain, bilateral   . H/O hematuria   . Hyperlipidemia   . Vitamin D deficiency   . Chronic combined systolic and diastolic CHF (congestive heart failure)     a. 12/2013 Echo: EF 30-35%, mod conc LVH, Gr 3 DD,  mild AI, mod-sev MR, mod dil LA, mild TR.  Marland Kitchen PAF (paroxysmal atrial fibrillation)     a. CHA2DS2VASc = 7--> Chronic Coumadin.  Marland Kitchen NICM (nonischemic cardiomyopathy)     a. 12/2013 Echo: EF 30-35%.  . Aortic valve prosthesis present     a. 2006: S/P bioprosthetic AVR.  Marland Kitchen PAD (peripheral artery disease)     a. s/p LLE stenting.  . Carotid arterial disease     a. s/p R CEA  . History of tobacco abuse   . Moderate to Severe Mitral Regurgitation     a. 12/2013 Echo: Mod-Sev MR.  Marland Kitchen Anxiety   . COPD (chronic obstructive pulmonary disease)   . Coronary arteriosclerosis Oct 2015    med Rx    Past Surgical History  Procedure Laterality Date  . Aortic valve replacement (avr)/coronary artery bypass grafting (cabg)  12/2008  . Eye surgery    . Carotid angiogram  1996  . Left lower extremity stent    . Carotid endarterectomy    . Insert / replace / remove pacemaker    . Coronary angiogram  Oct 2015  . Pacemaker insertion  Nov 2015    MDT BiV  . Left and right heart catheterization with coronary angiogram N/A 01/14/2014    Procedure: LEFT AND RIGHT HEART CATHETERIZATION WITH CORONARY ANGIOGRAM;  Surgeon: Peter M Martinique, MD;  Location:  [facility] CATH LAB;  Service: Cardiovascular;  Laterality: N/A;  . Bi-ventricular pacemaker insertion N/A 02/17/2014    Procedure: BI-VENTRICULAR PACEMAKER INSERTION (CRT-P);  Surgeon: Evans Lance, MD;  Location: [facility] CATH LAB;  Service: Cardiovascular;  Laterality: N/A;    Family History  Problem Relation Age of Onset  . Stroke Mother   . Cancer Father    Social History:  reports that he quit smoking about 15 years ago. He has never used smokeless tobacco. He reports that he drinks about 0.6 oz of alcohol per week. He reports that he does not use illicit drugs.  Allergies:  Allergies  Allergen Reactions  . Ambien [Zolpidem] Other (See Comments)    Hallucinations  . Ativan [Lorazepam] Other (See Comments)    hallucinations  . Oxycontin [Oxycodone] Other (See  Comments)    hallucinations  . Penicillins     Unknown childhood reaction   . Sulfa Antibiotics     unknown     (Not in a hospital admission)  Results for orders placed or performed during the hospital encounter of 05/04/14 (from the past 48 hour(s))  CBC     Status: Abnormal   Collection Time: 05/04/14  5:00 PM  Result Value Ref Range   WBC 8.1 4.0 - 10.5 K/uL   RBC 3.96 (L) 4.22 - 5.81 MIL/uL   Hemoglobin 13.2 13.0 - 17.0 g/dL   HCT 39.1 39.0 - 52.0 %   MCV 98.7 78.0 - 100.0 fL   MCH 33.3 26.0 - 34.0 pg   MCHC 33.8 30.0 - 36.0 g/dL   RDW 13.9 11.5 - 15.5 %   Platelets 263 150 - 400 K/uL  Basic metabolic panel     Status: Abnormal   Collection Time: 05/04/14  5:00 PM  Result Value Ref Range   Sodium 130 (L) 135 - 145 mmol/L   Potassium 4.3 3.5 - 5.1 mmol/L   Chloride 94 (L) 96 - 112 mmol/L   CO2 29 19 - 32 mmol/L   Glucose, Bld 120 (H) 70 - 99 mg/dL   BUN 43 (H) 6 - 23 mg/dL   Creatinine, Ser 1.79 (H) 0.50 - 1.35 mg/dL   Calcium 8.7 8.4 - 10.5 mg/dL   GFR calc non Af Amer 34 (L) >90 mL/min   GFR calc Af Amer 39 (L) >90 mL/min    Comment: (NOTE) The eGFR has been calculated using the CKD EPI equation. This calculation has not been validated in all clinical situations. eGFR's persistently <90 mL/min signify possible Chronic Kidney Disease.    Anion gap 7 5 - 15  BNP (order ONLY if patient complains of dyspnea/SOB AND you have documented it for THIS visit)     Status: Abnormal   Collection Time: 05/04/14  5:00 PM  Result Value Ref Range   B Natriuretic Peptide 1013.0 (H) 0.0 - 100.0 pg/mL  Troponin I (MHP)     Status: None   Collection Time: 05/04/14  5:00 PM  Result Value Ref Range   Troponin I <0.03 <0.031 ng/mL    Comment:        NO INDICATION OF MYOCARDIAL INJURY.   Protime-INR (if pt is taking Coumadin)     Status: Abnormal   Collection Time: 05/04/14  5:00 PM  Result Value Ref Range   Prothrombin Time 34.2 (H) 11.6 - 15.2 seconds   INR 3.38 (H) 0.00 -  1.49  Hepatic function panel     Status: None   Collection Time: 05/04/14  5:00 PM  Result Value Ref Range   Total Protein 6.6 6.0 - 8.3 g/dL   Albumin 3.7 3.5 - 5.2 g/dL   AST 21 0 - 37 U/L   ALT 13 0 - 53 U/L   Alkaline Phosphatase 83 39 - 117 U/L   Total Bilirubin 1.1 0.3 - 1.2 mg/dL   Bilirubin, Direct 0.3 0.0 - 0.5 mg/dL   Indirect Bilirubin 0.8 0.3 - 0.9 mg/dL   Dg Chest Port 1 View  05/04/2014   CLINICAL DATA:  Increased shortness of breath since this afternoon. Not able to urinate. History of hypertension, stroke, Coronary artery disease, COPD, pacemaker.  EXAM: PORTABLE CHEST - 1 VIEW  COMPARISON:  03/01/2014  FINDINGS: Postoperative changes in the mediastinum. Cardiac pacemaker. Shallow inspiration with atelectasis in the lung bases. Cardiac enlargement, increasing since prior study. Developing pulmonary vascular congestion and perihilar edema. Small bilateral pleural effusions are increasing since prior study. Fluid in the right major fissure again demonstrated. No pneumothorax. Calcification of the aorta.  IMPRESSION: Increasing heart size, pulmonary vascular congestion, and edema since prior study with small but increasing pleural effusions and basilar atelectasis. Fluid in the right minor fissure again demonstrated.   Electronically Signed   By: Lucienne Capers M.D.   On: 05/04/2014 17:39    Review of Systems  Constitutional: Positive for chills, weight loss and malaise/fatigue.  HENT: Negative for ear pain.   Eyes: Negative for blurred vision and photophobia.  Respiratory: Positive for cough and shortness of breath. Negative for hemoptysis.   Cardiovascular: Positive for orthopnea. Negative for chest pain, leg swelling and PND.  Gastrointestinal: Negative for nausea, vomiting and abdominal pain.  Genitourinary: Negative for dysuria and hematuria.  Musculoskeletal: Negative for myalgias and neck pain.  Neurological: Positive for weakness. Negative for tingling, tremors,  sensory change and headaches.  Endo/Heme/Allergies: Negative for polydipsia. Bruises/bleeds easily.  Psychiatric/Behavioral: Negative for depression, suicidal ideas and substance abuse.    Blood pressure 137/39, pulse 80, resp. rate 18, height _0  (1.778 m), weight 70.308 kg (155 lb), SpO2 98 %. Physical Exam  Nursing note and vitals reviewed. Constitutional: He is oriented to person, place, and time. He appears well-developed and well-nourished. No distress.  HENT:  Head: Normocephalic and atraumatic.  Nose: Nose normal.  Mouth/Throat: Oropharynx is clear and moist. No oropharyngeal exudate.  Eyes: Conjunctivae and EOM are normal. Pupils are equal, round, and reactive to light. No scleral icterus.  Neck: Normal range of motion. Neck supple. JVD present.  JVP midneck, slight HRJ  Cardiovascular: Normal rate, regular rhythm and intact distal pulses.   Murmur heard. Respiratory: He has rales.  Not great air movement, some crackles at bases, air movement improved superiorly  GI: Soft. Bowel sounds are normal. He exhibits no distension. There is no tenderness. There is no rebound.  Musculoskeletal: Normal range of motion. He exhibits no edema or tenderness.  Neurological: He is alert and oriented to person, place, and time. No cranial nerve deficit. Coordination normal.  Skin: Skin is warm and dry. No rash noted. He is not diaphoretic. No erythema.  Lukewarm extremities  Psychiatric: He has a normal mood and affect. His behavior is normal. Judgment and thought content normal.    Labs reviewed; wbc 8.1, h/h 13/39, plt 265, na 130, K 4.3, bun/cr 43/1.79, inr 3.4 Chest x-ray: right > left pulmonary edema Echo from 10/15 reviewed; EF 30-35%, grade III DD, moderate to severe MR ECG reviewed; BIV-paced HR 88 RHC (01/14/2014): RA 12, RV 42/11 (16), PA 42/23 (  25), PCWP 17, PA 61%, Fick CO/CI: 4.53/2.27 LHC (01/14/1014): moderate non-obstructive CAD  Assessment/Plan Problem List Acute on  chronic systolic HF Atrial fibrillation Pulmonary edema Stage III CKD Dyslipidemia  Nonobstructive CAD Chronic Anticoagulation  Moderate to Severe MR Hypoxemia   Timothy Durham is an 81 yo man with systolic and diastolic heart failure stage IIIb at home, stage III CKD, non-obstructive CAD, dyslipidemia, atrial fibrillation on chronic anticoagulation with warfarin, bioprosthetic AV from '06 and moderate to Severe MR who presented with worsening shortness of breath and found to have right > pulmonary edema and hypoxemia. His dry weight is 154-160 lbs with need to take metolazone for weight at 163 lbs or greater; however, he appears to be losing weight with weight this AM at home 154 lbs. He is on torsemide 20 mg bid, aldactone 12.5 mg at home, amiodarone 200 mg for CHADSVASC 5 and plavix for previous TIA, not on aspirin. No obvious triggers but clearly volume overloaded. Recent antibiotic treatment but no obvious worsening infectious symptoms. He feels much improved with IV lasix. He appears to have a narrow volume window; likely needs one more dose of IV lasix. Will write for AM dosing and bid but primary team may need to readjust/taper. Hopeful to be able to wean oxygen. If not, may need to consider other etiologies of hypoxemia.   - IV lasix 40 mg received; 40 mg written for in the AM bid - continue warfarin per pharmacy; likely hold dose or reduce to 1 mg this evening - will continue home dose coreg 6.25 mg bid - continue spironolactone 12.5 mg daily - continue amiodarone 200 mg - telemetry with daily electrolytes, potassium repletion    Malvina Schadler 05/04/2014, 8:24 PM

## 2014-05-05 DIAGNOSIS — I5043 Acute on chronic combined systolic (congestive) and diastolic (congestive) heart failure: Principal | ICD-10-CM

## 2014-05-05 LAB — BASIC METABOLIC PANEL
ANION GAP: 9 (ref 5–15)
BUN: 39 mg/dL — AB (ref 6–23)
CHLORIDE: 95 mmol/L — AB (ref 96–112)
CO2: 29 mmol/L (ref 19–32)
Calcium: 9.2 mg/dL (ref 8.4–10.5)
Creatinine, Ser: 1.9 mg/dL — ABNORMAL HIGH (ref 0.50–1.35)
GFR calc Af Amer: 36 mL/min — ABNORMAL LOW (ref >90)
GFR calc non Af Amer: 31 mL/min — ABNORMAL LOW (ref >90)
Glucose, Bld: 89 mg/dL (ref 70–99)
POTASSIUM: 3.5 mmol/L (ref 3.5–5.1)
SODIUM: 133 mmol/L — AB (ref 135–145)

## 2014-05-05 LAB — PROTIME-INR
INR: 2.76 — ABNORMAL HIGH (ref 0.00–1.49)
Prothrombin Time: 29.4 seconds — ABNORMAL HIGH (ref 11.6–15.2)

## 2014-05-05 MED ORDER — WARFARIN - PHARMACIST DOSING INPATIENT: Freq: Every day | Status: DC

## 2014-05-05 MED ORDER — CETYLPYRIDINIUM CHLORIDE 0.05 % MT LIQD
7.0000 mL | Freq: Two times a day (BID) | OROMUCOSAL | Status: DC
  Administered 2014-05-06: 7 mL via OROMUCOSAL

## 2014-05-05 MED ORDER — WARFARIN SODIUM 4 MG PO TABS
4.0000 mg | ORAL_TABLET | Freq: Once | ORAL | Status: AC
  Administered 2014-05-05: 4 mg via ORAL
  Filled 2014-05-05: qty 1

## 2014-05-05 NOTE — Care Management Note (Addendum)
    Page 1 of 1   05/06/2014     12:14:58 PM CARE MANAGEMENT NOTE 05/06/2014  Patient:  Timothy Durham, Timothy Durham   Account Number:  [id number]  Date Initiated:  05/05/2014  Documentation initiated by:  Junius Creamer  Subjective/Objective Assessment:   adm w heart failure     Action/Plan:   lives w wife, pcp dr Dennis Bast   Anticipated DC Date:  05/06/2014   Anticipated DC Plan:  HOME W HOME HEALTH SERVICES         [facility] Choice  Resumption Of Svcs/PTA Provider   Choice offered to / List presented to:  C-1 Patient   DME arranged  OXYGEN      DME agency  [facility]     [facility] arranged  HH-1 RN  HH-10 DISEASE MANAGEMENT      HH agency  [facility]   Status of service:   Medicare Important Message given?  NA - LOS <3 / Initial given by admissions (If response is "NO", the following Medicare IM given date fields will be blank) Date Medicare IM given:   Medicare IM given by:   Date Additional Medicare IM given:   Additional Medicare IM given by:    Discharge Disposition:  HOME W HOME HEALTH SERVICES  Per UR Regulation:  Reviewed for med. necessity/level of care/duration of stay  If discussed at Long Length of Stay Meetings, dates discussed:    Comments:  2/9 0953 debbie Remmington Teters rn,bsn alerted donna w [facility] of poss dc home today.ahc will resume hhrn. pt will need home oxygen. have faxed ref for home o2 on 2-9 at 1210p. port o2 to be del to room.

## 2014-05-05 NOTE — Progress Notes (Signed)
ANTICOAGULATION CONSULT NOTE - Follow Up Consult  Pharmacy Consult for coumadin Indication: atrial fibrillation  Allergies  Allergen Reactions  . Ambien [Zolpidem] Other (See Comments)    Hallucinations  . Ativan [Lorazepam] Other (See Comments)    hallucinations  . Oxycontin [Oxycodone] Other (See Comments)    hallucinations  . Penicillins     Unknown childhood reaction   . Sulfa Antibiotics     unknown    Patient Measurements: Height: 5\' 10"  (177.8 cm) Weight: 148 lb 9.4 oz (67.4 kg) IBW/kg (Calculated) : 73   Vital Signs: Temp: 97.9 F (36.6 C) (02/08 1200) Temp Source: Oral (02/08 1200) BP: 96/33 mmHg (02/08 1200)  Labs:  Recent Labs  05/04/14 1700 05/05/14 0420 05/05/14 1455  HGB 13.2  --   --   HCT 39.1  --   --   PLT 263  --   --   LABPROT 34.2*  --  29.4*  INR 3.38*  --  2.76*  CREATININE 1.79* 1.90*  --   TROPONINI <0.03  --   --     Estimated Creatinine Clearance: 29.1 mL/min (by C-G formula based on Cr of 1.9).   Medications:  Scheduled:  . acetaminophen  1,000 mg Oral QHS  . allopurinol  200 mg Oral Daily  . amiodarone  200 mg Oral Daily  . [START ON 05/06/2014] antiseptic oral rinse  7 mL Mouth Rinse BID  . atorvastatin  40 mg Oral QPM  . carvedilol  6.25 mg Oral BID WC  . clopidogrel  75 mg Oral Daily  . DULoxetine  60 mg Oral Daily  . furosemide  40 mg Intravenous BID  . omega-3 acid ethyl esters  1 g Oral Daily  . pantoprazole  40 mg Oral Daily  . sodium chloride  3 mL Intravenous Q12H  . spironolactone  12.5 mg Oral Daily    Assessment: 81 yo male here with HF and also with history of  PAF on coumadin at home. Pharmacy has been consulted to dose. INR today = 2.76. Coumadin was held 2/7 for INR of 3.38.  Home coumadin dose: 4mg  MTuWe, 2mg  ThFrSaSu (last taken 2/6)  Goal of Therapy:  INR 2-3 Monitor platelets by anticoagulation protocol: Yes   Plan:  -Coumadin 4mg  po today -Daily PT/INR  Harland German, Pharm D 05/05/2014 4:14  PM

## 2014-05-05 NOTE — Progress Notes (Signed)
    Subjective:  Feels better today. Breathing improved. Denies orthopnea.   Objective:  Vital Signs in the last 24 hours: Temp:  [97.4 F (36.3 C)-98.3 F (36.8 C)] 97.9 F (36.6 C) (02/08 1200) Pulse Rate:  [71-87] 75 (02/07 2254) Resp:  [11-33] 11 (02/08 1200) BP: (96-147)/(18-98) 96/33 mmHg (02/08 1200) SpO2:  [91 %-99 %] 97 % (02/08 1200) Weight:  [148 lb 9.4 oz (67.4 kg)-155 lb (70.308 kg)] 148 lb 9.4 oz (67.4 kg) (02/08 0353)  Intake/Output from previous day: 02/07 0701 - 02/08 0700 In: 123 [P.O.:120; I.V.:3] Out: 1650 [Urine:1650]  Physical Exam: Pt is alert and oriented, elderly male in NAD HEENT: normal Neck: JVP - normal Lungs: diminished in bases, otherwise clear CV: RRR without murmur or gallop Abd: soft, NT, Positive BS, no hepatomegaly Ext: ne edema Skin: warm/dry no rash   Lab Results:  Recent Labs  05/04/14 1700  WBC 8.1  HGB 13.2  PLT 263    Recent Labs  05/04/14 1700 05/05/14 0420  NA 130* 133*  K 4.3 3.5  CL 94* 95*  CO2 29 29  GLUCOSE 120* 89  BUN 43* 39*  CREATININE 1.79* 1.90*    Recent Labs  05/04/14 1700  TROPONINI <0.03    Cardiac Studies: 2D Echo 01-12-2014: Study Conclusions  - Left ventricle: There is global hypokinesis with akinesis of the basal and mid inferolateral walls. The cavity size was moderately dilated. There was moderate concentric hypertrophy. Systolic function was moderately to severely reduced. The estimated ejection fraction was in the range of 30% to 35%. Doppler parameters are consistent with a restrictive pattern, indicative of decreased left ventricular diastolic compliance and/or increased left atrial pressure (grade 3 diastolic dysfunction). Doppler parameters are consistent with elevated ventricular end-diastolic filling pressure. - Aortic valve: Bioprosthetic valve seems to be functioning normally. Normal transaortic gradients. There is mild central regurgitation  and no paravalvular leak. There was mild regurgitation directed eccentrically in the LVOT. Valve area (Vmax): 0.96 cm^2. - Aorta: The aorta was normal, not dilated, and non-diseased. Aortic root dimension: 40 mm (ED). - Mitral valve: There was moderate to severe regurgitation. - Left atrium: The atrium was moderately dilated. - Right ventricle: Systolic function was moderately reduced. - Atrial septum: No defect or patent foramen ovale was identified. - Tricuspid valve: There was mild regurgitation.  Impressions:  - When compared to the prior study from 03/26/2013 the left ventricle is now moderately dilated with moderate to severe systolic dysfunction and estimated LVEF 30-35%, previously 40-45%. There is restrictive pattern of diastolic dysfunction with elevated filling pressures. Mitral regurgitation is moderate to severe, previosuly mild. Right ventricular systolic function is moderately reduced.   Tele: V-pacing, occasional PVC's  Assessment/Plan:  1. Acute on chronic systolic heart failure, NYHA Functional Class 4 2. PAF - chronic anticoagulation 3. PAD/carotid disease 4. Moderate-severe MR 5. CKD, Stage III  Clinically improved with IV lasix. Will continue today and convert back to home regimen tomorrow am. Home weights have been stable (reviewed chart), but weight down 7# already from yesterday with diuresis. Pt on appropriate med Rx for HF. No ACE with CKD and risk for acute kidney injury. No clear exam evidence of volume overload after diuresis, but suspect he's very sensitive to left-sided HF with severe MR. Will ask [facility] HF team to see him tomorrow.  Tonny Bollman, M.D. 05/05/2014, 1:51 PM

## 2014-05-06 DIAGNOSIS — N183 Chronic kidney disease, stage 3 (moderate): Secondary | ICD-10-CM

## 2014-05-06 DIAGNOSIS — I5023 Acute on chronic systolic (congestive) heart failure: Secondary | ICD-10-CM

## 2014-05-06 LAB — PROTIME-INR
INR: 2.55 — ABNORMAL HIGH (ref 0.00–1.49)
Prothrombin Time: 27.6 seconds — ABNORMAL HIGH (ref 11.6–15.2)

## 2014-05-06 LAB — BASIC METABOLIC PANEL
Anion gap: 17 — ABNORMAL HIGH (ref 5–15)
BUN: 56 mg/dL — ABNORMAL HIGH (ref 6–23)
CO2: 23 mmol/L (ref 19–32)
Calcium: 9.2 mg/dL (ref 8.4–10.5)
Chloride: 94 mmol/L — ABNORMAL LOW (ref 96–112)
Creatinine, Ser: 2.08 mg/dL — ABNORMAL HIGH (ref 0.50–1.35)
GFR calc Af Amer: 33 mL/min — ABNORMAL LOW (ref >90)
GFR calc non Af Amer: 28 mL/min — ABNORMAL LOW (ref >90)
Glucose, Bld: 91 mg/dL (ref 70–99)
POTASSIUM: 3.3 mmol/L — AB (ref 3.5–5.1)
SODIUM: 134 mmol/L — AB (ref 135–145)

## 2014-05-06 MED ORDER — TORSEMIDE 20 MG PO TABS: 40.0000 mg | ORAL_TABLET | Freq: Every day | ORAL | Status: DC

## 2014-05-06 MED ORDER — HYDRALAZINE HCL 25 MG PO TABS
12.5000 mg | ORAL_TABLET | Freq: Three times a day (TID) | ORAL | Status: DC
  Administered 2014-05-06: 12.5 mg via ORAL
  Filled 2014-05-06 (×3): qty 0.5

## 2014-05-06 MED ORDER — ISOSORBIDE MONONITRATE ER 30 MG PO TB24: 30.0000 mg | ORAL_TABLET | Freq: Every day | ORAL | Status: DC

## 2014-05-06 MED ORDER — TORSEMIDE 20 MG PO TABS
20.0000 mg | ORAL_TABLET | Freq: Every day | ORAL | Status: DC
  Filled 2014-05-06: qty 1

## 2014-05-06 MED ORDER — POTASSIUM CHLORIDE CRYS ER 20 MEQ PO TBCR
40.0000 meq | EXTENDED_RELEASE_TABLET | ORAL | Status: AC
  Administered 2014-05-06 (×2): 40 meq via ORAL
  Filled 2014-05-06 (×2): qty 2

## 2014-05-06 MED ORDER — POTASSIUM CHLORIDE CRYS ER 20 MEQ PO TBCR
20.0000 meq | EXTENDED_RELEASE_TABLET | Freq: Once | ORAL | Status: AC
  Administered 2014-05-06: 20 meq via ORAL
  Filled 2014-05-06: qty 1

## 2014-05-06 MED ORDER — TORSEMIDE 20 MG PO TABS: ORAL_TABLET | ORAL | Status: DC

## 2014-05-06 MED ORDER — WARFARIN SODIUM 4 MG PO TABS
4.0000 mg | ORAL_TABLET | Freq: Once | ORAL | Status: DC
  Filled 2014-05-06: qty 1

## 2014-05-06 MED ORDER — HYDRALAZINE HCL 25 MG PO TABS: 12.5000 mg | ORAL_TABLET | Freq: Three times a day (TID) | ORAL | Status: DC

## 2014-05-06 MED ORDER — ISOSORBIDE MONONITRATE ER 30 MG PO TB24
30.0000 mg | ORAL_TABLET | Freq: Every day | ORAL | Status: DC
  Administered 2014-05-06: 30 mg via ORAL
  Filled 2014-05-06 (×2): qty 1

## 2014-05-06 NOTE — Progress Notes (Signed)
ANTICOAGULATION CONSULT NOTE - Follow Up Consult  Pharmacy Consult for coumadin Indication: atrial fibrillation  Allergies  Allergen Reactions  . Ambien [Zolpidem] Other (See Comments)    Hallucinations  . Ativan [Lorazepam] Other (See Comments)    hallucinations  . Oxycontin [Oxycodone] Other (See Comments)    hallucinations  . Penicillins     Unknown childhood reaction   . Sulfa Antibiotics     unknown    Patient Measurements: Height: 5\' 10"  (177.8 cm) Weight: 143 lb 15.4 oz (65.3 kg) IBW/kg (Calculated) : 73   Vital Signs: Temp: 97.5 F (36.4 C) (02/09 0839) Temp Source: Oral (02/09 0839) BP: 154/55 mmHg (02/09 0839) Pulse Rate: 74 (02/09 0839)  Labs:  Recent Labs  05/04/14 1700 05/05/14 0420 05/05/14 1455 05/06/14 0408  HGB 13.2  --   --   --   HCT 39.1  --   --   --   PLT 263  --   --   --   LABPROT 34.2*  --  29.4* 27.6*  INR 3.38*  --  2.76* 2.55*  CREATININE 1.79* 1.90*  --  2.08*  TROPONINI <0.03  --   --   --     Estimated Creatinine Clearance: 25.7 mL/min (by C-G formula based on Cr of 2.08).   Medications:  Scheduled:  . acetaminophen  1,000 mg Oral QHS  . allopurinol  200 mg Oral Daily  . amiodarone  200 mg Oral Daily  . antiseptic oral rinse  7 mL Mouth Rinse BID  . atorvastatin  40 mg Oral QPM  . carvedilol  6.25 mg Oral BID WC  . clopidogrel  75 mg Oral Daily  . DULoxetine  60 mg Oral Daily  . furosemide  40 mg Intravenous BID  . hydrALAZINE  12.5 mg Oral 3 times per day  . isosorbide mononitrate  30 mg Oral Daily  . omega-3 acid ethyl esters  1 g Oral Daily  . pantoprazole  40 mg Oral Daily  . potassium chloride  40 mEq Oral Q4H  . sodium chloride  3 mL Intravenous Q12H  . spironolactone  12.5 mg Oral Daily  . [START ON 05/07/2014] torsemide  40 mg Oral Daily   And  . torsemide  20 mg Oral q1800  . Warfarin - Pharmacist Dosing Inpatient   Does not apply q1800    Assessment: 81 yo male here with HF and also with history of   PAF on coumadin at home. Pharmacy has been consulted to dose. INR today = 2.5. Coumadin was held 2/7 for INR of 3.38.  Home coumadin dose: 4mg  MTuWe, 2mg  ThFrSaSu (last taken 2/6)  Goal of Therapy:  INR 2-3 Monitor platelets by anticoagulation protocol: Yes   Plan:  -Coumadin 4mg  po today -Daily PT/INR  Sheppard Coil PharmD., BCPS Clinical Pharmacist Pager [phone] 05/06/2014 9:13 AM

## 2014-05-06 NOTE — Discharge Summary (Signed)
Advanced Heart Failure Team  Discharge Summary   Patient ID: Timothy Durham MRN: [id number], DOB/AGE: Aug 14, 1930 81 y.o. Admit date: 05/04/2014 D/C date:     05/06/2014   Primary Discharge Diagnoses:  1. A/C Combined Systolic/Diastolic Heart Failure 2. Hypoxia- discharged on home oxygen.  3. PAF- [place] Vasc Score =on amiodarone and chronic coumadin 4. CKD Stage III 5. Mod-Severe MR    Hospital Course:  Timothy Durham is an 81 yo male with a history of prior CVA in 2000 secondary to AF, NICM s/p CRT-P (02/17/14), chronic systolic HF, non-obstructive CAD, HTN, CKD stage III, HLD, PAD and bioprosthetic AVR  Admitted with mild volume overload and hypoxia. On arrival to the ED oxygen saturations were 83% and he was placed on 2 liters oxygen. He was diuresed with IV lasix and transitioned torsemide 40 mg in am and 20 mg in pm. Overall he diuresed 5 pounds. He will continue current bb and spiro dose. No Ace due to CKD. Add hydralazine and imdur for afterload reduction.   He had hypoxia with O2 sats less than 88% on room air and with exertion therefore home oxygen was set up 2 liters [place] continuously. He will also be referred to pulmonary for COPD.    He will continue to be followed closely in the HF clinic and will be seen next week and check BMET. [facility] will also resume home health.   Discharge Weight Range: 143 pounds.  Discharge Vitals: Blood pressure 130/50, pulse 74, temperature 97.3 F (36.3 C), temperature source Oral, resp. rate 14, height  (1.778 m), weight 143 lb 15.4 oz (65.3 kg), SpO2 90 %.  Labs: Lab Results  Component Value Date   WBC 8.1 05/04/2014   HGB 13.2 05/04/2014   HCT 39.1 05/04/2014   MCV 98.7 05/04/2014   PLT 263 05/04/2014    Recent Labs Lab 05/04/14 1700  05/06/14 0408  NA 130*  < > 134*  K 4.3  < > 3.3*  CL 94*  < > 94*  CO2 29  < > 23  BUN 43*  < > 56*  CREATININE 1.79*  < > 2.08*  CALCIUM 8.7  < > 9.2  PROT 6.6  --   --   BILITOT 1.1  --   --    ALKPHOS 83  --   --   ALT 13  --   --   AST 21  --   --   GLUCOSE 120*  < > 91  < > = values in this interval not displayed. Lab Results  Component Value Date   CHOL 146 03/26/2013   HDL 47 03/26/2013   LDLCALC 81 03/26/2013   TRIG 91 03/26/2013   BNP (last 3 results)  Recent Labs  05/04/14 1700  BNP 1013.0*    ProBNP (last 3 results)  Recent Labs  02/24/14 1606 02/26/14 1623 03/06/14 1129  PROBNP 2973.0* 18948.0* 5943.0*     Diagnostic Studies/Procedures   Dg Chest Port 1 View  05/04/2014   CLINICAL DATA:  Increased shortness of breath since this afternoon. Not able to urinate. History of hypertension, stroke, Coronary artery disease, COPD, pacemaker.  EXAM: PORTABLE CHEST - 1 VIEW  COMPARISON:  03/01/2014  FINDINGS: Postoperative changes in the mediastinum. Cardiac pacemaker. Shallow inspiration with atelectasis in the lung bases. Cardiac enlargement, increasing since prior study. Developing pulmonary vascular congestion and perihilar edema. Small bilateral pleural effusions are increasing since prior study. Fluid in the right major fissure again demonstrated. No pneumothorax.  Calcification of the aorta.  IMPRESSION: Increasing heart size, pulmonary vascular congestion, and edema since prior study with small but increasing pleural effusions and basilar atelectasis. Fluid in the right minor fissure again demonstrated.   Electronically Signed   By: Burman Nieves M.D.   On: 05/04/2014 17:39    Discharge Medications     Medication List    TAKE these medications        acetaminophen 500 MG tablet  Commonly known as:  TYLENOL  Take 1,000 mg by mouth at bedtime.     allopurinol 100 MG tablet  Commonly known as:  ZYLOPRIM  Take 200 mg by mouth daily.     amiodarone 200 MG tablet  Commonly known as:  PACERONE  Take 1 tablet (200 mg total) by mouth daily.     atorvastatin 40 MG tablet  Commonly known as:  LIPITOR  Take 40 mg by mouth every evening.     CALCIUM  600 + D PO  Take 2 tablets by mouth every morning.     carvedilol 6.25 MG tablet  Commonly known as:  COREG  Take 1 tablet (6.25 mg total) by mouth 2 (two) times daily.     clopidogrel 75 MG tablet  Commonly known as:  PLAVIX  Take 75 mg by mouth daily.     DULoxetine 60 MG capsule  Commonly known as:  CYMBALTA  Take 60 mg by mouth daily.     Fish Oil 1200 MG Caps  Take 1 capsule by mouth every morning.     hydrALAZINE 25 MG tablet  Commonly known as:  APRESOLINE  Take 0.5 tablets (12.5 mg total) by mouth every 8 (eight) hours.     isosorbide mononitrate 30 MG 24 hr tablet  Commonly known as:  IMDUR  Take 1 tablet (30 mg total) by mouth daily.     metolazone 5 MG tablet  Commonly known as:  ZAROXOLYN  Take 1 tablet (5 mg total) by mouth as needed. FOR WEIGHT 163 LB OR GREATER     omeprazole 40 MG capsule  Commonly known as:  PRILOSEC  Take 40 mg by mouth every morning.     potassium chloride SA 20 MEQ tablet  Commonly known as:  K-DUR,KLOR-CON  Take 1 tablet (20 mEq total) by mouth as needed. TAKE ONLY WHEN YOU TAKE METOLAZONE     spironolactone 25 MG tablet  Commonly known as:  ALDACTONE  Take 0.5 tablets (12.5 mg total) by mouth daily.     tamsulosin 0.4 MG Caps capsule  Commonly known as:  FLOMAX  Take 1 capsule (0.4 mg total) by mouth at bedtime.     torsemide 20 MG tablet  Commonly known as:  DEMADEX  Take 40 mg in am and 20 mg in pm     warfarin 4 MG tablet  Commonly known as:  COUMADIN  Take 2-4 mg by mouth See admin instructions. Take 4 mg on Monday, Tuesday, Wednesday and 2 mg on Thursday, Friday, Saturday and Sunday        Disposition   The patient will be discharged in stable condition to home. Discharge Instructions    Contraindication to ACEI at discharge    Complete by:  As directed      Diet - low sodium heart healthy    Complete by:  As directed      Heart Failure patients record your daily weight using the same scale at the same time  of day  Complete by:  As directed      Increase activity slowly    Complete by:  As directed           Follow-up Information    Follow up with Marca Ancona, MD On 05/13/2014.   Specialty:  Cardiology   Why:  at 3:20 garage code [id number]    Contact information:   [address]  [address] [place] [address] [phone]         Duration of Discharge Encounter: Greater than 35 minutes   Signed, CLEGG,AMY NP-C  05/06/2014, 1:37 PM

## 2014-05-06 NOTE — Discharge Instructions (Signed)

## 2014-05-06 NOTE — Consult Note (Signed)
Advanced Heart Failure Rounding Note   Subjective:   Timothy Durham is an 81 yo male with a history of prior CVA in 2000 secondary to AF, NICM s/p CRT-P (02/17/14), chronic systolic HF, non-obstructive CAD, HTN, CKD stage III, HLD, PAD and bioprosthetic AVR  Admitted yesterday with increased dyspnea and hypoxia. Diuresed with IV lasix. Weight down 5 pounds. Breathing much improved. Wants to go home.    Creatinine 1.9> 2.08   Studies: RHC (01/14/2014): RA 12, RV 42/11 (16), PA 42/23 (25), PCWP 17, PA 61%, Fick CO/CI: 4.53/2.27 LHC (01/14/1014): moderate non-obstructive CAD with 50% CFX  Echo (12/2013): EF 30-35%, grade III DD, AV bioprosthetic valve nl, V max: 0.96 cm^2, mod/severe MR, RV sys. fx moderately reduced, TR mild   Objective:   Weight Range:  Vital Signs:   Temp:  [97.4 F (36.3 C)-97.9 F (36.6 C)] 97.5 F (36.4 C) (02/09 0345) Pulse Rate:  [68] 68 (02/08 2043) Resp:  [11-26] 12 (02/09 0400) BP: (96-138)/(27-50) 138/31 mmHg (02/09 0400) SpO2:  [87 %-97 %] 95 % (02/09 0400) Weight:  [143 lb 15.4 oz (65.3 kg)] 143 lb 15.4 oz (65.3 kg) (02/09 0345) Last BM Date: 05/05/14  Weight change: Filed Weights   05/04/14 2053 05/05/14 0353 05/06/14 0345  Weight: 151 lb 0.2 oz (68.5 kg) 148 lb 9.4 oz (67.4 kg) 143 lb 15.4 oz (65.3 kg)    Intake/Output:   Intake/Output Summary (Last 24 hours) at 05/06/14 0840 Last data filed at 05/06/14 0600  Gross per 24 hour  Intake    720 ml  Output    950 ml  Net   -230 ml     Physical Exam: General:  Well appearing. No resp difficulty HEENT: normal Neck: supple. JVP 6-7. Carotids 2+ bilat; no bruits. No lymphadenopathy or thryomegaly appreciated. Cor: PMI nondisplaced. Regular rate & rhythm. No rubs, gallops or murmurs. Lungs: clear Abdomen: soft, nontender, nondistended. No hepatosplenomegaly. No bruits or masses. Good bowel sounds. Extremities: no cyanosis, clubbing, rash, edema Neuro: alert & orientedx3, cranial nerves grossly  intact. moves all 4 extremities w/o difficulty. Affect pleasant  Telemetry: V paced 70s  Labs: Basic Metabolic Panel:  Recent Labs Lab 05/04/14 1700 05/05/14 0420 05/06/14 0408  NA 130* 133* 134*  K 4.3 3.5 3.3*  CL 94* 95* 94*  CO2 GLUCOSE 120* 89 91  BUN 43* 39* 56*  CREATININE 1.79* 1.90* 2.08*  CALCIUM 8.7 9.2 9.2    Liver Function Tests:  Recent Labs Lab 05/04/14 1700  AST 21  ALT 13  ALKPHOS 83  BILITOT 1.1  PROT 6.6  ALBUMIN 3.7   No results for input(s): LIPASE, AMYLASE in the last 168 hours. No results for input(s): AMMONIA in the last 168 hours.  CBC:  Recent Labs Lab 05/04/14 1700  WBC 8.1  HGB 13.2  HCT 39.1  MCV 98.7  PLT 263    Cardiac Enzymes:  Recent Labs Lab 05/04/14 1700  TROPONINI <0.03    BNP: BNP (last 3 results)  Recent Labs  05/04/14 1700  BNP 1013.0*    ProBNP (last 3 results)  Recent Labs  02/24/14 1606 02/26/14 1623 03/06/14 1129  PROBNP 2973.0* 18948.0* 5943.0*      Other results:  EKG:   Imaging: Dg Chest Port 1 View  05/04/2014   CLINICAL DATA:  Increased shortness of breath since this afternoon. Not able to urinate. History of hypertension, stroke, Coronary artery disease, COPD, pacemaker.  EXAM: PORTABLE CHEST - 1 VIEW  COMPARISON:  03/01/2014  FINDINGS: Postoperative changes in the mediastinum. Cardiac pacemaker. Shallow inspiration with atelectasis in the lung bases. Cardiac enlargement, increasing since prior study. Developing pulmonary vascular congestion and perihilar edema. Small bilateral pleural effusions are increasing since prior study. Fluid in the right major fissure again demonstrated. No pneumothorax. Calcification of the aorta.  IMPRESSION: Increasing heart size, pulmonary vascular congestion, and edema since prior study with small but increasing pleural effusions and basilar atelectasis. Fluid in the right minor fissure again demonstrated.   Electronically Signed   By: Burman Nieves M.D.   On: 05/04/2014 17:39      Medications:     Scheduled Medications: . acetaminophen  1,000 mg Oral QHS  . allopurinol  200 mg Oral Daily  . amiodarone  200 mg Oral Daily  . antiseptic oral rinse  7 mL Mouth Rinse BID  . atorvastatin  40 mg Oral QPM  . carvedilol  6.25 mg Oral BID WC  . clopidogrel  75 mg Oral Daily  . DULoxetine  60 mg Oral Daily  . furosemide  40 mg Intravenous BID  . omega-3 acid ethyl esters  1 g Oral Daily  . pantoprazole  40 mg Oral Daily  . sodium chloride  3 mL Intravenous Q12H  . spironolactone  12.5 mg Oral Daily  . Warfarin - Pharmacist Dosing Inpatient   Does not apply q1800     Infusions:     PRN Medications:  sodium chloride, acetaminophen, ondansetron (ZOFRAN) IV, sodium chloride   Assessment/Plan   [name] is an 81 yo man with systolic and diastolic heart failure stage IIIb at home, stage III CKD, non-obstructive CAD, dyslipidemia, atrial fibrillation on chronic anticoagulation with warfarin, bioprosthetic AV from '06 and moderate to Severe MR who presented with worsening shortness of breath and found to have right > pulmonary edema and hypoxemia.   1) Acute/Chronic combined systolic/diastolic HF: NICM s/p CRT-P, EF 30-35%, grade III DD, mod/severe MR, RV sys fx mod reduced (12/2013). NYHA IIIB -- Over night he diuresed with IV lasix . Weight down 5 pounds. Stop IV lasix and place on torsemide 40 mg in am and 20 mg in pm. Weight well below his recent dry weight (158-162 pounds. Supplement K.  - Continue spiro 12.5 mg daily for now - Not Ace due to CKD.  - Start hydralazine 12.5 mg tid/imdur 30 daily .  2) Hypoxia- On arrival to ED O2 sats 83% Room Air. Today O2 sats 87 % on room air. Check O2 sats with ambulation. May need home oxygen.   3) PAF- CHADVASC score = 5. regular rhythm . Continue amiodarone 200 mg daily. On coumadin and plavix daily (had TIA on coumadin). Off ASA. INR per -pharmacy.  4) CKD stage  III- watch closely as it seems to be trending up.  5) Gout- on Allopurinol.  6) Bioprosthetic AVR - stable on recent echo. INR managed by PCP.   Wants to go home. Will discuss with Dr Shirlee Latch. [facility] to resume when discharged.   Length of Stay: 2  CLEGG,AMY NP-C  05/06/2014, 8:40 AM  Advanced Heart Failure Team Pager [phone] (M-F; 7a - 4p)  Please contact [facility] for night-coverage after hours (4p -7a ) and weekends on [email or web address]  Patient seen with NP, agree with the above note.  Volume status much improved.  Weight continues to fall. Creatinine starting to trend up.  He is A-V sequentially paced.  - Transition to torsemide today as above at higher  than baseline dose.  - Hydralazine/imdur to start at low dose.  - Continue current Coreg.  - May go home today.  We will see him back next week for close followup.   He is also going to need home oxygen.  He needs to see pulmonary for full evaluation of COPD as I think this is playing a large role in his hypoxemia as he is not markedly volume overloaded at this point.   Marca Ancona 05/06/2014 12:02 PM

## 2014-05-06 NOTE — Progress Notes (Deleted)
   Called to his room by staff nurse for Afib RVR with rate 140-150s. SBP 90s. Currently on amiodarone 30 mg per hour and milrinone at 0.25 mcg.   He was given 150 mg amiodarone bolus and amiodarone was increased to 60 mg hour. Check CO-OX now.   Chemically converted to Sinus Tach low 100s. Continue current plan.   Waldine Zenz NP-C  9:23 AM

## 2014-05-06 NOTE — Progress Notes (Signed)
Pts. O2 sats go down to mid 80s while sitting and ambulating. Placed on oxygen and O2 increased to 95-96 %

## 2014-05-08 ENCOUNTER — Telehealth (HOSPITAL_COMMUNITY): Payer: Self-pay | Admitting: Vascular Surgery

## 2014-05-08 NOTE — Telephone Encounter (Signed)
[facility] called pt weight has been creeping up 2/8 145.8 lb 2/9 146.8 lbs today 147 please advise

## 2014-05-08 NOTE — Telephone Encounter (Signed)
Wife states that patient is up in weight 2 lbs since discharge.  No swelling, no SOB, states patient "feels great".  Advised if it goes up another 2 lbs or more tomorrow, can take extra torsemide tomorrow.  Aware and agreeable.  Ave Filter

## 2014-05-12 ENCOUNTER — Telehealth (HOSPITAL_COMMUNITY): Payer: Self-pay | Admitting: Vascular Surgery

## 2014-05-12 ENCOUNTER — Telehealth (HOSPITAL_COMMUNITY): Payer: Self-pay

## 2014-05-12 NOTE — Telephone Encounter (Signed)
Over weekend provider called about elevated Cr of 2.08 (unsure of name), advised patient at that time to hold torsemide Saturday, and only take 20mg  in am and 20mg  in pm, Sunday (usually takes 40 am and 20 pm).  Patient weight up 5 lbs since weekend, not SOB but "uncomfortable".  Advised to resume 40mg  in am and 20mg  in pm as he is being seen in our clinic tomorrow.  Will recheck BMET at that time.  Aware and agreeable.  Ave Filter

## 2014-05-12 NOTE — Telephone Encounter (Signed)
Pt wife left message she wants to speak to someone she has questions about pt medication

## 2014-05-12 NOTE — Telephone Encounter (Signed)
Nurse with [facility] called to see if we could order a standing sliding scale order for lasix with parameters based on weight/symptoms since patient had pattern of sudden readmits based on CHF.  Patient will be seen tomorrow in our office, will defer to providers.  Ave Filter

## 2014-05-13 ENCOUNTER — Ambulatory Visit (HOSPITAL_COMMUNITY)
Admit: 2014-05-13 | Discharge: 2014-05-13 | Disposition: A | Discharge status: Home / Self Care | Payer: Medicare HMO | Source: Ambulatory Visit | Attending: Internal Medicine | Admitting: Internal Medicine

## 2014-05-13 VITALS — BP 110/42 | HR 96 | Wt 152.8 lb

## 2014-05-13 DIAGNOSIS — R0683 Snoring: Secondary | ICD-10-CM | POA: Diagnosis not present

## 2014-05-13 DIAGNOSIS — N183 Chronic kidney disease, stage 3 unspecified: Secondary | ICD-10-CM

## 2014-05-13 DIAGNOSIS — I5022 Chronic systolic (congestive) heart failure: Secondary | ICD-10-CM

## 2014-05-13 DIAGNOSIS — W19XXXA Unspecified fall, initial encounter: Secondary | ICD-10-CM | POA: Diagnosis not present

## 2014-05-13 DIAGNOSIS — Z09 Encounter for follow-up examination after completed treatment for conditions other than malignant neoplasm: Secondary | ICD-10-CM | POA: Diagnosis not present

## 2014-05-13 NOTE — Progress Notes (Signed)
Patient ID: Timothy Durham, male   DOB: Aug 05, 1930, 81 y.o.   MRN: [id number] PCP: Dr Luiz Iron Primary Cardiologist: Dr. Ladona Ridgel  HPI: Mr. Makris is an 81 yo male with a history of prior CVA in 2000 secondary to AF, NICM s/p CRT-P (02/17/14), chronic systolic HF, non-obstructive CAD, HTN, CKD stage III, HLD, PAD and bioprosthetic AVR. He has been admitted 3 times in the last couple of months for dyspnea that was related to volume overload.   Admitted to [facility] 2/7 through 05/06/14 with volume overload. Diuresed with IV lasix and later transitioned to 40 mg torsemide in am and 20 mg in pm. Also started on home oxygen. Discharge weight was 143 pounds.    He returns for follow up. Overall feels great.  Denies PND/Orthopnea. Had a fall on 2/11 but refused evaluation at ED. Weight at home 148-150 pounds.  Taking all medications. [facility] following. On home oxygen at night. Snores. Has day time fatigue.     Studies: RHC (01/14/2014): RA 12, RV 42/11 (16), PA 42/23 (25), PCWP 17, PA 61%, Fick CO/CI: 4.53/2.27 LHC (01/14/1014): moderate non-obstructive CAD with 50% CFX  Echo (12/2013): EF 30-35%, grade III DD, AV bioprosthetic valve nl, V max: 0.96 cm^2, mod/severe MR, RV sys. fx moderately reduced, TR mild  Labs: 03/10/2014  K 5.8 Cr 1.68 04/08/2014  K 4.0 Cr 1.60 Na 136 05/06/2014 K 3.3 Creatinine 2.08  05/12/2014 K 4.1 Creatinine 2.1   ROS: All systems negative except as listed in HPI, PMH and Problem List.  SH:  History   Social History  . Marital Status: Married    Spouse Name: N/A  . Number of Children: N/A  . Years of Education: N/A   Occupational History  . Not on file.   Social History Main Topics  . Smoking status: Former Smoker    Quit date: 09/26/1998  . Smokeless tobacco: Never Used  . Alcohol Use: 0.6 oz/week    1 Cans of beer per week     Comment: drinks one can of beer daily.  . Drug Use: No  . Sexual Activity: Not on file   Other Topics Concern  . Not on file   Social History  Narrative    FH:  Family History  Problem Relation Age of Onset  . Stroke Mother   . Cancer Father     Past Medical History  Diagnosis Date  . Arthritis   . Non-obstructive CAD     a. 12/2013 NSTEMI/Cath: LM nl, LAD 20, LCX 40-50, RCA 20.  Marland Kitchen Hypertension   . Stroke     a. July 2000;  b. January 2007;  c. TIA in 2014.  . CKD (chronic kidney disease), stage III   . GERD (gastroesophageal reflux disease)   . Foot pain, bilateral   . H/O hematuria   . Hyperlipidemia   . Vitamin D deficiency   . Chronic combined systolic and diastolic CHF (congestive heart failure)     a. 12/2013 Echo: EF 30-35%, mod conc LVH, Gr 3 DD, mild AI, mod-sev MR, mod dil LA, mild TR.  Marland Kitchen PAF (paroxysmal atrial fibrillation)     a. CHA2DS2VASc = 7--> Chronic Coumadin.  Marland Kitchen NICM (nonischemic cardiomyopathy)     a. 12/2013 Echo: EF 30-35%.  . Aortic valve prosthesis present     a. 2006: S/P bioprosthetic AVR.  Marland Kitchen PAD (peripheral artery disease)     a. s/p LLE stenting.  . Carotid arterial disease     a. s/p R  CEA  . History of tobacco abuse   . Moderate to Severe Mitral Regurgitation     a. 12/2013 Echo: Mod-Sev MR.  Marland Kitchen Anxiety   . COPD (chronic obstructive pulmonary disease)   . Coronary arteriosclerosis Oct 2015    med Rx    Current Outpatient Prescriptions  Medication Sig Dispense Refill  . acetaminophen (TYLENOL) 500 MG tablet Take 1,000 mg by mouth at bedtime.     Marland Kitchen allopurinol (ZYLOPRIM) 100 MG tablet Take 200 mg by mouth daily.    Marland Kitchen amiodarone (PACERONE) 200 MG tablet Take 1 tablet (200 mg total) by mouth daily. 90 tablet 3  . atorvastatin (LIPITOR) 40 MG tablet Take 40 mg by mouth every evening.    . Calcium Carb-Cholecalciferol (CALCIUM 600 + D PO) Take 2 tablets by mouth every morning.    . carvedilol (COREG) 6.25 MG tablet Take 1 tablet (6.25 mg total) by mouth 2 (two) times daily. 180 tablet 3  . clopidogrel (PLAVIX) 75 MG tablet Take 75 mg by mouth daily.    . DULoxetine (CYMBALTA) 60  MG capsule Take 60 mg by mouth daily.     . hydrALAZINE (APRESOLINE) 25 MG tablet Take 0.5 tablets (12.5 mg total) by mouth every 8 (eight) hours. 90 tablet 6  . isosorbide mononitrate (IMDUR) 30 MG 24 hr tablet Take 1 tablet (30 mg total) by mouth daily. 30 tablet 6  . metolazone (ZAROXOLYN) 5 MG tablet Take 1 tablet (5 mg total) by mouth as needed. FOR WEIGHT 163 LB OR GREATER 30 tablet 3  . Omega-3 Fatty Acids (FISH OIL) 1200 MG CAPS Take 1 capsule by mouth every morning.    Marland Kitchen omeprazole (PRILOSEC) 40 MG capsule Take 40 mg by mouth every morning.    . potassium chloride SA (K-DUR,KLOR-CON) 20 MEQ tablet Take 1 tablet (20 mEq total) by mouth as needed. TAKE ONLY WHEN YOU TAKE METOLAZONE 30 tablet 3  . spironolactone (ALDACTONE) 25 MG tablet Take 0.5 tablets (12.5 mg total) by mouth daily. 90 tablet 3  . tamsulosin (FLOMAX) 0.4 MG CAPS capsule Take 1 capsule (0.4 mg total) by mouth at bedtime. 90 capsule 3  . torsemide (DEMADEX) 20 MG tablet Take 40 mg in am and 20 mg in pm 90 tablet 3  . warfarin (COUMADIN) 4 MG tablet Take 2-4 mg by mouth See admin instructions. Take 4 mg on Monday, Tuesday, Wednesday and 2 mg on Thursday, Friday, Saturday and Sunday     No current facility-administered medications for this encounter.    Filed Vitals:   05/13/14 1534  BP: 110/42  Pulse: 96  Weight: 152 lb 12.8 oz (69.31 kg)  SpO2: 92%    PHYSICAL EXAM: General:  Elderly appearing. No resp difficulty Wife present  HEENT: normal Neck: supple. JVP 5-6. Carotids 2+ bilaterally; no bruits. No lymphadenopathy or thryomegaly appreciated. Cor: PMI normal. Regular rate & rhythm. No rubs, gallops. 2/6 MR Lungs: clear  Abdomen: soft, nontender, nondistended. No hepatosplenomegaly. No bruits or masses. Good bowel sounds. Extremities: no cyanosis, clubbing, rash, edema Neuro: alert & orientedx3, cranial nerves grossly intact. Moves all 4 extremities w/o difficulty. Affect pleasant   ASSESSMENT & PLAN:  1)  Chronic combined systolic/diastolic HF: NICM s/p CRT-P, EF 30-35%, grade III DD, mod/severe MR, RV sys fx mod reduced (12/2013). NYHA IIIB -- Continue torsemide 40 mg in am and 20 mg in pm.  - Continue spiro 12.5 mg daily  - Continue current dose of hydralazine/imdur 2 PAF- CHADVASC score =  5. regular rhythm. Continue amiodarone 200 mg daily.  On coumadin and plavix daily (had TIA on coumadin). Off ASA.  3)  CKD stage III- stable 4) Gout- on Allopurinol.  5) Bioprosthetic AVR - stable on recent echo. INR managed by PCP.  6) Hypoxia- Discharged on home oxygen. Uses oxygen at night. Needs to keep o2 sats > 88%  7) Snores- Day time fatigue. Refer to pulmonary for sleep study.  8) Fall 05/08/2014 - had fall at home. Refused evaluation at ED. I have encourage him to have medical evaluation in the future if he falls again due to coumadin.    Follow up 3 weeks.   Nykeria Mealing NP-C  3:01 PM

## 2014-05-13 NOTE — Patient Instructions (Signed)
Follow up in 3 weeks  Do the following things EVERYDAY: 1) Weigh yourself in the morning before breakfast. Write it down and keep it in a log. 2) Take your medicines as prescribed 3) Eat low salt foods-Limit salt (sodium) to 2000 mg per day.  4) Stay as active as you can everyday 5) Limit all fluids for the day to less than 2 liters 

## 2014-05-15 ENCOUNTER — Telehealth (HOSPITAL_COMMUNITY): Payer: Self-pay | Admitting: Vascular Surgery

## 2014-05-15 DIAGNOSIS — W19XXXA Unspecified fall, initial encounter: Secondary | ICD-10-CM | POA: Insufficient documentation

## 2014-05-15 DIAGNOSIS — G4733 Obstructive sleep apnea (adult) (pediatric): Secondary | ICD-10-CM | POA: Insufficient documentation

## 2014-05-15 NOTE — Telephone Encounter (Signed)
[facility] from [facility] called you can fax orders for IV lasix [phone]

## 2014-05-15 NOTE — Telephone Encounter (Signed)
Per Dr Gala Romney ok to give 40 mg IV Lasix x 1 dose for sob or wt gain if no relief after extra PO torsemide, order faxed

## 2014-05-20 ENCOUNTER — Ambulatory Visit (INDEPENDENT_AMBULATORY_CARE_PROVIDER_SITE_OTHER): Payer: Medicare HMO | Admitting: Internal Medicine

## 2014-05-20 ENCOUNTER — Telehealth (HOSPITAL_COMMUNITY): Payer: Self-pay | Admitting: Vascular Surgery

## 2014-05-20 ENCOUNTER — Encounter: Payer: Self-pay | Admitting: Internal Medicine

## 2014-05-20 VITALS — BP 102/46 | HR 65 | Ht 70.0 in | Wt 156.8 lb

## 2014-05-20 DIAGNOSIS — I5022 Chronic systolic (congestive) heart failure: Secondary | ICD-10-CM | POA: Diagnosis not present

## 2014-05-20 DIAGNOSIS — I447 Left bundle-branch block, unspecified: Secondary | ICD-10-CM | POA: Diagnosis not present

## 2014-05-20 DIAGNOSIS — Z950 Presence of cardiac pacemaker: Secondary | ICD-10-CM

## 2014-05-20 DIAGNOSIS — I480 Paroxysmal atrial fibrillation: Secondary | ICD-10-CM | POA: Diagnosis not present

## 2014-05-20 DIAGNOSIS — R634 Abnormal weight loss: Secondary | ICD-10-CM

## 2014-05-20 LAB — MDC_IDC_ENUM_SESS_TYPE_INCLINIC
Battery Voltage: 3.04 V
Brady Statistic AP VP Percent: 33.9 %
Brady Statistic AS VP Percent: 65.01 %
Brady Statistic AS VS Percent: 1 %
Date Time Interrogation Session: 20160223131741
Lead Channel Impedance Value: 361 Ohm
Lead Channel Impedance Value: 513 Ohm
Lead Channel Impedance Value: 589 Ohm
Lead Channel Impedance Value: 684 Ohm
Lead Channel Pacing Threshold Amplitude: 1 V
Lead Channel Pacing Threshold Amplitude: 1 V
Lead Channel Pacing Threshold Pulse Width: 0.4 ms
Lead Channel Pacing Threshold Pulse Width: 0.4 ms
Lead Channel Pacing Threshold Pulse Width: 0.4 ms
Lead Channel Sensing Intrinsic Amplitude: 23.25 mV
Lead Channel Setting Pacing Amplitude: 1.75 V
Lead Channel Setting Pacing Amplitude: 2.25 V
Lead Channel Setting Pacing Pulse Width: 0.4 ms
Lead Channel Setting Pacing Pulse Width: 0.8 ms
Lead Channel Setting Sensing Sensitivity: 0.9 mV
MDC IDC MSMT BATTERY REMAINING LONGEVITY: 91 mo
MDC IDC MSMT LEADCHNL LV IMPEDANCE VALUE: 361 Ohm
MDC IDC MSMT LEADCHNL LV IMPEDANCE VALUE: 475 Ohm
MDC IDC MSMT LEADCHNL LV IMPEDANCE VALUE: 570 Ohm
MDC IDC MSMT LEADCHNL LV IMPEDANCE VALUE: 722 Ohm
MDC IDC MSMT LEADCHNL RA IMPEDANCE VALUE: 494 Ohm
MDC IDC MSMT LEADCHNL RA PACING THRESHOLD AMPLITUDE: 0.75 V
MDC IDC MSMT LEADCHNL RA SENSING INTR AMPL: 1.375 mV
MDC IDC SET LEADCHNL RV PACING AMPLITUDE: 2 V
MDC IDC SET ZONE DETECTION INTERVAL: 400 ms
MDC IDC STAT BRADY AP VS PERCENT: 0.1 %
MDC IDC STAT BRADY RA PERCENT PACED: 33.99 %
MDC IDC STAT BRADY RV PERCENT PACED: 98.9 %
Zone Setting Detection Interval: 400 ms

## 2014-05-20 NOTE — Telephone Encounter (Signed)
Nurse called Urine output is not as much as pt expected .Marland Kitchen Please advise

## 2014-05-20 NOTE — Progress Notes (Signed)
HPI Timothy Durham returns today for followup. He is status post biventricular pacemaker insertion. In the interim, he has continued to have problems with fluid overload and prerenal azotemia. He has lost weight. He has been fairly inactive. He denies dietary indiscretion. He has not had syncope. He currently has no peripheral edema but has had problems with swelling in the past few weeks. He is followed by our heart failure service. Allergies  Allergen Reactions  . Ambien [Zolpidem] Other (See Comments)    Hallucinations  . Ativan [Lorazepam] Other (See Comments)    hallucinations  . Oxycontin [Oxycodone] Other (See Comments)    hallucinations  . Penicillins     Unknown childhood reaction   . Sulfa Antibiotics     unknown     Current Outpatient Prescriptions  Medication Sig Dispense Refill  . acetaminophen (TYLENOL) 500 MG tablet Take 1,000 mg by mouth at bedtime.     Marland Kitchen allopurinol (ZYLOPRIM) 100 MG tablet Take 200 mg by mouth daily.    Marland Kitchen amiodarone (PACERONE) 200 MG tablet Take 1 tablet (200 mg total) by mouth daily. 90 tablet 3  . atorvastatin (LIPITOR) 40 MG tablet Take 40 mg by mouth every evening.    . Calcium Carb-Cholecalciferol (CALCIUM 600 + D PO) Take 2 tablets by mouth every morning.    . carvedilol (COREG) 6.25 MG tablet Take 1 tablet (6.25 mg total) by mouth 2 (two) times daily. 180 tablet 3  . clopidogrel (PLAVIX) 75 MG tablet Take 75 mg by mouth daily.    . DULoxetine (CYMBALTA) 60 MG capsule Take 60 mg by mouth daily.     . hydrALAZINE (APRESOLINE) 25 MG tablet Take 0.5 tablets (12.5 mg total) by mouth every 8 (eight) hours. 90 tablet 6  . isosorbide mononitrate (IMDUR) 30 MG 24 hr tablet Take 1 tablet (30 mg total) by mouth daily. 30 tablet 6  . metolazone (ZAROXOLYN) 5 MG tablet Take 1 tablet (5 mg total) by mouth as needed. FOR WEIGHT 163 LB OR GREATER (Patient taking differently: Take 5 mg by mouth as needed (fluid). ) 30 tablet 3  . Omega-3 Fatty Acids (FISH  OIL) 1200 MG CAPS Take 1 capsule by mouth every morning.    Marland Kitchen omeprazole (PRILOSEC) 40 MG capsule Take 40 mg by mouth every morning.    . potassium chloride SA (K-DUR,KLOR-CON) 20 MEQ tablet Take 1 tablet (20 mEq total) by mouth as needed. TAKE ONLY WHEN YOU TAKE METOLAZONE 30 tablet 3  . spironolactone (ALDACTONE) 25 MG tablet Take 0.5 tablets (12.5 mg total) by mouth daily. 90 tablet 3  . tamsulosin (FLOMAX) 0.4 MG CAPS capsule Take 1 capsule (0.4 mg total) by mouth at bedtime. 90 capsule 3  . torsemide (DEMADEX) 20 MG tablet Take 40 mg in am and 20 mg in pm 90 tablet 3  . warfarin (COUMADIN) 4 MG tablet Take 2-4 mg by mouth See admin instructions. Take 4 mg on Monday, Tuesday, Wednesday and 2 mg on Thursday, Friday, Saturday and Sunday     No current facility-administered medications for this visit.     Past Medical History  Diagnosis Date  . Arthritis   . Non-obstructive CAD     a. 12/2013 NSTEMI/Cath: LM nl, LAD 20, LCX 40-50, RCA 20.  Marland Kitchen Hypertension   . Stroke     a. July 2000;  b. January 2007;  c. TIA in 2014.  . CKD (chronic kidney disease), stage III   . GERD (gastroesophageal reflux  disease)   . Foot pain, bilateral   . H/O hematuria   . Hyperlipidemia   . Vitamin D deficiency   . Chronic combined systolic and diastolic CHF (congestive heart failure)     a. 12/2013 Echo: EF 30-35%, mod conc LVH, Gr 3 DD, mild AI, mod-sev MR, mod dil LA, mild TR.  Marland Kitchen PAF (paroxysmal atrial fibrillation)     a. CHA2DS2VASc = 7--> Chronic Coumadin.  Marland Kitchen NICM (nonischemic cardiomyopathy)     a. 12/2013 Echo: EF 30-35%.  . Aortic valve prosthesis present     a. 2006: S/P bioprosthetic AVR.  Marland Kitchen PAD (peripheral artery disease)     a. s/p LLE stenting.  . Carotid arterial disease     a. s/p R CEA  . History of tobacco abuse   . Moderate to Severe Mitral Regurgitation     a. 12/2013 Echo: Mod-Sev MR.  Marland Kitchen Anxiety   . COPD (chronic obstructive pulmonary disease)   . Coronary arteriosclerosis Oct  2015    med Rx    ROS:   All systems reviewed and negative except as noted in the HPI.   Past Surgical History  Procedure Laterality Date  . Aortic valve replacement (avr)/coronary artery bypass grafting (cabg)  12/2008  . Eye surgery    . Carotid angiogram  1996  . Left lower extremity stent    . Carotid endarterectomy    . Insert / replace / remove pacemaker    . Coronary angiogram  Oct 2015  . Pacemaker insertion  Nov 2015    MDT BiV  . Left and right heart catheterization with coronary angiogram N/A 01/14/2014    Procedure: LEFT AND RIGHT HEART CATHETERIZATION WITH CORONARY ANGIOGRAM;  Surgeon: Peter M [place], MD;  Location: [facility] CATH LAB;  Service: Cardiovascular;  Laterality: N/A;  . Bi-ventricular pacemaker insertion N/A 02/17/2014    Procedure: BI-VENTRICULAR PACEMAKER INSERTION (CRT-P);  Surgeon: Marinus Maw, MD;  Location: [facility] CATH LAB;  Service: Cardiovascular;  Laterality: N/A;     Family History  Problem Relation Age of Onset  . Stroke Mother   . Cancer Father      History   Social History  . Marital Status: Married    Spouse Name: N/A  . Number of Children: N/A  . Years of Education: N/A   Occupational History  . Not on file.   Social History Main Topics  . Smoking status: Former Smoker    Quit date: 09/26/1998  . Smokeless tobacco: Never Used  . Alcohol Use: 0.6 oz/week    1 Cans of beer per week     Comment: drinks one can of beer daily.  . Drug Use: No  . Sexual Activity: Not on file   Other Topics Concern  . Not on file   Social History Narrative     BP 102/46 mmHg  Pulse 65  Ht 5\' 10"  (1.778 m)  Wt 156 lb 12.8 oz (71.124 kg)  BMI 22.50 kg/m2  Physical Exam:  Well appearing NAD HEENT: Unremarkable Neck:  No JVD, no thyromegally Lymphatics:  No adenopathy Back:  No CVA tenderness Lungs:  Clear HEART:  Regular rate rhythm, 2/6 systolic murmur consistent with mitral regurgitation, no rubs, no clicks Abd:  soft, positive bowel  sounds, no organomegally, no rebound, no guarding Ext:  2 plus pulses, no edema, no cyanosis, no clubbing Skin:  No rashes no nodules Neuro:  CN II through XII intact, motor grossly intact  EKG  DEVICE  Normal  device function.  See PaceArt for details.   Assess/Plan:

## 2014-05-20 NOTE — Assessment & Plan Note (Signed)
His symptoms remain class III. He is encouraged to increase his physical activity, and he will follow-up in our heart failure clinic.

## 2014-05-20 NOTE — Assessment & Plan Note (Signed)
Interrogation of his Medtronic dual-chamber pacemaker demonstrates normal pacemaker function including normal left ventricular pacing function. He is biventricular pacing 98% of the time.

## 2014-05-20 NOTE — Assessment & Plan Note (Signed)
I suspect this is related to inactivity and poor oral intake. He is encouraged on both.

## 2014-05-20 NOTE — Patient Instructions (Signed)
Your physician wants you to follow-up in: 9 months with Dr. Taylor You will receive a reminder letter in the mail two months in advance. If you don't receive a letter, please call our office to schedule the follow-up appointment.  Remote monitoring is used to monitor your Pacemaker of ICD from home. This monitoring reduces the number of office visits required to check your device to one time per year. It allows us to keep an eye on the functioning of your device to ensure it is working properly. You are scheduled for a device check from home on 08/19/14. You may send your transmission at any time that day. If you have a wireless device, the transmission will be sent automatically. After your physician reviews your transmission, you will receive a postcard with your next transmission date.    

## 2014-05-20 NOTE — Assessment & Plan Note (Signed)
He is maintaining sinus rhythm on amiodarone therapy. He will continue this medication.

## 2014-05-21 ENCOUNTER — Encounter: Payer: Self-pay | Admitting: Internal Medicine

## 2014-05-26 ENCOUNTER — Encounter (HOSPITAL_COMMUNITY): Payer: Commercial Managed Care - HMO

## 2014-05-26 NOTE — Telephone Encounter (Signed)
Will continue to monitor, no increased weight, no swelling/edema, no SOB.  Patient has appointment next week 06/03/14.

## 2014-05-30 ENCOUNTER — Telehealth (HOSPITAL_COMMUNITY): Payer: Self-pay

## 2014-05-30 MED ORDER — TORSEMIDE 20 MG PO TABS: 20.0000 mg | ORAL_TABLET | Freq: Two times a day (BID) | ORAL | Status: DC

## 2014-05-30 NOTE — Telephone Encounter (Signed)
Wife called concerned that patient may be too dried out.  Weight is down a few pounds more than normal at home, and also fatigued and has no energy.  Advised may cut down lasix to 20mg  in am and 20 mg in pm.  Has an appointment with [place] this upcoming Tuesday, will assess at that time.  Ave Filter

## 2014-06-03 ENCOUNTER — Ambulatory Visit (HOSPITAL_COMMUNITY)
Admission: RE | Admit: 2014-06-03 | Discharge: 2014-06-03 | Disposition: A | Discharge status: Home / Self Care | Payer: Medicare HMO | Source: Ambulatory Visit | Attending: Cardiology | Admitting: Cardiology

## 2014-06-03 ENCOUNTER — Telehealth: Payer: Self-pay | Admitting: Cardiology

## 2014-06-03 ENCOUNTER — Telehealth (HOSPITAL_COMMUNITY): Payer: Self-pay

## 2014-06-03 VITALS — BP 110/42 | HR 86 | Wt 154.1 lb

## 2014-06-03 DIAGNOSIS — Z79899 Other long term (current) drug therapy: Secondary | ICD-10-CM | POA: Diagnosis not present

## 2014-06-03 DIAGNOSIS — I429 Cardiomyopathy, unspecified: Secondary | ICD-10-CM | POA: Diagnosis not present

## 2014-06-03 DIAGNOSIS — M109 Gout, unspecified: Secondary | ICD-10-CM | POA: Insufficient documentation

## 2014-06-03 DIAGNOSIS — I5042 Chronic combined systolic (congestive) and diastolic (congestive) heart failure: Secondary | ICD-10-CM | POA: Insufficient documentation

## 2014-06-03 DIAGNOSIS — I252 Old myocardial infarction: Secondary | ICD-10-CM | POA: Diagnosis not present

## 2014-06-03 DIAGNOSIS — I2510 Atherosclerotic heart disease of native coronary artery without angina pectoris: Secondary | ICD-10-CM | POA: Diagnosis not present

## 2014-06-03 DIAGNOSIS — N183 Chronic kidney disease, stage 3 unspecified: Secondary | ICD-10-CM

## 2014-06-03 DIAGNOSIS — Z7902 Long term (current) use of antithrombotics/antiplatelets: Secondary | ICD-10-CM | POA: Insufficient documentation

## 2014-06-03 DIAGNOSIS — Z7901 Long term (current) use of anticoagulants: Secondary | ICD-10-CM | POA: Diagnosis not present

## 2014-06-03 DIAGNOSIS — E559 Vitamin D deficiency, unspecified: Secondary | ICD-10-CM | POA: Insufficient documentation

## 2014-06-03 DIAGNOSIS — Z87891 Personal history of nicotine dependence: Secondary | ICD-10-CM | POA: Insufficient documentation

## 2014-06-03 DIAGNOSIS — I480 Paroxysmal atrial fibrillation: Secondary | ICD-10-CM

## 2014-06-03 DIAGNOSIS — K219 Gastro-esophageal reflux disease without esophagitis: Secondary | ICD-10-CM | POA: Diagnosis not present

## 2014-06-03 DIAGNOSIS — I5022 Chronic systolic (congestive) heart failure: Secondary | ICD-10-CM

## 2014-06-03 DIAGNOSIS — Z953 Presence of xenogenic heart valve: Secondary | ICD-10-CM | POA: Insufficient documentation

## 2014-06-03 DIAGNOSIS — I340 Nonrheumatic mitral (valve) insufficiency: Secondary | ICD-10-CM

## 2014-06-03 DIAGNOSIS — J449 Chronic obstructive pulmonary disease, unspecified: Secondary | ICD-10-CM | POA: Insufficient documentation

## 2014-06-03 DIAGNOSIS — Z952 Presence of prosthetic heart valve: Secondary | ICD-10-CM

## 2014-06-03 DIAGNOSIS — E785 Hyperlipidemia, unspecified: Secondary | ICD-10-CM | POA: Insufficient documentation

## 2014-06-03 DIAGNOSIS — Z954 Presence of other heart-valve replacement: Secondary | ICD-10-CM

## 2014-06-03 DIAGNOSIS — R0683 Snoring: Secondary | ICD-10-CM

## 2014-06-03 MED ORDER — HYDRALAZINE HCL 25 MG PO TABS: 12.5000 mg | ORAL_TABLET | Freq: Two times a day (BID) | ORAL | Status: DC

## 2014-06-03 MED ORDER — SPIRONOLACTONE 25 MG PO TABS: 25.0000 mg | ORAL_TABLET | Freq: Every day | ORAL | Status: DC

## 2014-06-03 NOTE — Telephone Encounter (Signed)
New Message        Heart Failure Clinic calling to schedule pt for a sleep study with Dr. Mayford Knife. Please call back and advise.

## 2014-06-03 NOTE — Telephone Encounter (Signed)
Timothy Durham called to ask about scheduling a sleep study and how soon we could get the patient scheduled. Informed her that sleep studies are being scheduled out to the end of April right now.  Instructed her to call [facility] if she needs it done sooner. Timothy Durham st she already called [facility]. Instructed her to ask if the patient could be put on a waiting list to have the sleep study done sooner if necessary. Instructed Timothy Durham to call back if she needs anything.

## 2014-06-03 NOTE — Patient Instructions (Signed)
Labs today  INCREASE Spironolactone 25 mg, one tablet daily  You have been referred to [facility] for a sleep study. You have been referred to [facility], they will contact you for a appointment.  Your physician recommends that you schedule a follow-up appointment in: 2 months  Do the following things EVERYDAY: 1) Weigh yourself in the morning before breakfast. Write it down and keep it in a log. 2) Take your medicines as prescribed 3) Eat low salt foods-Limit salt (sodium) to 2000 mg per day.  4) Stay as active as you can everyday 5) Limit all fluids for the day to less than 2 liters 6)

## 2014-06-04 NOTE — Progress Notes (Signed)
Patient ID: Timothy Durham, male   DOB: Jun 03, 1930, 81 y.o.   MRN: [id number] PCP: Dr Luiz Iron Primary Cardiologist: Dr. Ladona Ridgel  HPI: Timothy Durham is an 81 yo male with a history of prior CVA in 2000 secondary to AF, nonischemic cardimoyopathy s/p Medtronic CRT-P (02/17/14), chronic systolic HF, non-obstructive CAD, HTN, CKD stage III, HLD, PAD and bioprosthetic AVR. He has been admitted 3 times in the last couple of months for dyspnea that was related to volume overload.   Admitted to [facility] 2/7 through 05/06/14 with volume overload. Diuresed with IV lasix and later transitioned to 40 mg torsemide in am and 20 mg in pm. Also started on home oxygen at night. Discharge weight was 143 pounds.    He returns for follow up. He seems to be doing well.  No dyspnea walking on flat ground.  No chest pain.  No orthopnea or PND.  No lightheadedness.  He occasionally takes an extra 20 mg torsemide in the evening.  He is in NSR today on amiodarone. Weight is up 2 lbs.   Optivol was checked today.  Fluid index was < threshold and thoracic impedance was stable.   Studies: RHC (01/14/2014): RA 12, RV 42/11 (16), PA 42/23 (25), PCWP 17, PA 61%, Fick CO/CI: 4.53/2.27 LHC (01/14/1014): moderate non-obstructive CAD with 50% CFX  Echo (12/2013): EF 30-35%, grade III DD, AV bioprosthetic valve nl, V max: 0.96 cm^2, mod/severe MR, RV sys. fx moderately reduced, TR mild  Labs: 03/10/2014  K 5.8 Cr 1.68 04/08/2014  K 4.0 Cr 1.60 Na 136 05/06/2014 K 3.3 Creatinine 2.08  05/12/2014 K 4.1 Creatinine 2.1    ROS: All systems negative except as listed in HPI, PMH and Problem List.  SH:  History   Social History  . Marital Status: Married    Spouse Name: N/A  . Number of Children: N/A  . Years of Education: N/A   Occupational History  . Not on file.   Social History Main Topics  . Smoking status: Former Smoker    Quit date: 09/26/1998  . Smokeless tobacco: Never Used  . Alcohol Use: 0.6 oz/week    1 Cans of beer per week      Comment: drinks one can of beer daily.  . Drug Use: No  . Sexual Activity: Not on file   Other Topics Concern  . Not on file   Social History Narrative    FH:  Family History  Problem Relation Age of Onset  . Stroke Mother   . Cancer Father     Past Medical History  Diagnosis Date  . Arthritis   . Non-obstructive CAD     a. 12/2013 NSTEMI/Cath: LM nl, LAD 20, LCX 40-50, RCA 20.  Marland Kitchen Hypertension   . Stroke     a. July 2000;  b. January 2007;  c. TIA in 2014.  . CKD (chronic kidney disease), stage III   . GERD (gastroesophageal reflux disease)   . Foot pain, bilateral   . H/O hematuria   . Hyperlipidemia   . Vitamin D deficiency   . Chronic combined systolic and diastolic CHF (congestive heart failure)     a. 12/2013 Echo: EF 30-35%, mod conc LVH, Gr 3 DD, mild AI, mod-sev MR, mod dil LA, mild TR.  Marland Kitchen PAF (paroxysmal atrial fibrillation)     a. CHA2DS2VASc = 7--> Chronic Coumadin.  Marland Kitchen NICM (nonischemic cardiomyopathy)     a. 12/2013 Echo: EF 30-35%.  . Aortic valve prosthesis present  a. 2006: S/P bioprosthetic AVR.  Marland Kitchen PAD (peripheral artery disease)     a. s/p LLE stenting.  . Carotid arterial disease     a. s/p R CEA  . History of tobacco abuse   . Moderate to Severe Mitral Regurgitation     a. 12/2013 Echo: Mod-Sev MR.  Marland Kitchen Anxiety   . COPD (chronic obstructive pulmonary disease)   . Coronary arteriosclerosis Oct 2015    med Rx    Current Outpatient Prescriptions  Medication Sig Dispense Refill  . acetaminophen (TYLENOL) 500 MG tablet Take 1,000 mg by mouth at bedtime.     Marland Kitchen allopurinol (ZYLOPRIM) 100 MG tablet Take 200 mg by mouth daily.    Marland Kitchen amiodarone (PACERONE) 200 MG tablet Take 1 tablet (200 mg total) by mouth daily. 90 tablet 3  . atorvastatin (LIPITOR) 40 MG tablet Take 40 mg by mouth every evening.    . Calcium Carb-Cholecalciferol (CALCIUM 600 + D PO) Take 2 tablets by mouth every morning.    . carvedilol (COREG) 6.25 MG tablet Take 1 tablet  (6.25 mg total) by mouth 2 (two) times daily. 180 tablet 3  . clopidogrel (PLAVIX) 75 MG tablet Take 75 mg by mouth daily.    . DULoxetine (CYMBALTA) 60 MG capsule Take 60 mg by mouth daily.     . hydrALAZINE (APRESOLINE) 25 MG tablet Take 0.5 tablets (12.5 mg total) by mouth 2 (two) times daily. 90 tablet 6  . isosorbide mononitrate (IMDUR) 30 MG 24 hr tablet Take 1 tablet (30 mg total) by mouth daily. 30 tablet 6  . metolazone (ZAROXOLYN) 5 MG tablet Take 1 tablet (5 mg total) by mouth as needed. FOR WEIGHT 163 LB OR GREATER (Patient taking differently: Take 5 mg by mouth as needed (fluid). ) 30 tablet 3  . Omega-3 Fatty Acids (FISH OIL) 1200 MG CAPS Take 1 capsule by mouth every morning.    Marland Kitchen omeprazole (PRILOSEC) 40 MG capsule Take 40 mg by mouth every morning.    . potassium chloride SA (K-DUR,KLOR-CON) 20 MEQ tablet Take 1 tablet (20 mEq total) by mouth as needed. TAKE ONLY WHEN YOU TAKE METOLAZONE 30 tablet 3  . spironolactone (ALDACTONE) 25 MG tablet Take 1 tablet (25 mg total) by mouth daily. 90 tablet 3  . tamsulosin (FLOMAX) 0.4 MG CAPS capsule Take 1 capsule (0.4 mg total) by mouth at bedtime. 90 capsule 3  . torsemide (DEMADEX) 20 MG tablet Take 1 tablet (20 mg total) by mouth 2 (two) times daily. Take 40 mg in am and 20 mg in pm 90 tablet 3  . warfarin (COUMADIN) 4 MG tablet Take 2-4 mg by mouth See admin instructions. Take 4 mg on Monday, Tuesday, Wednesday and 2 mg on Thursday, Friday, Saturday and Sunday     No current facility-administered medications for this encounter.    Filed Vitals:   06/03/14 1334  BP: 110/42  Pulse: 86  Weight: 154 lb 1.9 oz (69.908 kg)  SpO2: 96%    PHYSICAL EXAM: General:  Elderly appearing. No resp difficulty Wife present  HEENT: normal Neck: supple. JVP 5-6. Carotids 2+ bilaterally; no bruits. No lymphadenopathy or thryomegaly appreciated. Cor: PMI normal. Regular rate & rhythm. No rubs, gallops. 2/6 early SEM RUSB Lungs: clear  Abdomen:  soft, nontender, nondistended. No hepatosplenomegaly. No bruits or masses. Good bowel sounds. Extremities: no cyanosis, clubbing, rash, edema Neuro: alert & orientedx3, cranial nerves grossly intact. Moves all 4 extremities w/o difficulty. Affect pleasant   ASSESSMENT &  PLAN:  1) Chronic combined systolic/diastolic HF: Nonischemic cardiomyopathy s/p Medtronic CRT-P.  Echo with EF 30-35%, grade III DD, mod/severe MR, RV sys fx mod reduced (12/2013). NYHA II-III symptoms.  He does not look volume overloaded on exam.  Optivol also looks ok.  - Continue torsemide 40 mg in am and 20 mg in pm.  - Increase spironolactone to 25 mg daily.  BMET today and again in 2 wks.   - Continue current doses of hydralazine/imdur and Coreg.  2) PAF:  CHADVASC score = 5. NSR today. Continue amiodarone 200 mg daily.  On coumadin and plavix daily (had TIA on coumadin). Check LFTs and TSH today.  He will need yearly eye exam given use of amiodarone.  3) CKD stage III: Stable. Check BMET today.  I will arrange for nephrology appointment.  4) Gout: On Allopurinol.  5) Bioprosthetic AVR: Stable on recent echo. Needs endocarditis prophylaxis with dental work.  6) Suspected OSA: Day time fatigue. Will make sure that sleep study has been arranged.   Marca Ancona 06/04/2014

## 2014-06-05 ENCOUNTER — Telehealth (HOSPITAL_COMMUNITY): Payer: Self-pay

## 2014-06-05 NOTE — Telephone Encounter (Signed)
Discussed Pulmonary Rehab with patient.  Patient states that he is interested and would talk it over with his wife and follow up with me.

## 2014-06-06 ENCOUNTER — Telehealth (HOSPITAL_COMMUNITY): Payer: Self-pay | Admitting: Vascular Surgery

## 2014-06-06 NOTE — Telephone Encounter (Signed)
Patient is currently enrolled in [employer] twice a week and enjoys this program.  Would like to continue this program instead.   Advised that Pulmonary rehab will be a back up plan/offer for them if [employer] does not show improvement for him.  Ave Filter

## 2014-06-06 NOTE — Telephone Encounter (Signed)
Pt wife called she would like to speak to someone.. Pt got a call from Cardiac rehab yesterday pt wife wants to know who set this up and why ? Marland KitchenMarland Kitchen Please advise

## 2014-06-10 ENCOUNTER — Encounter: Payer: Self-pay | Admitting: Internal Medicine

## 2014-06-12 ENCOUNTER — Ambulatory Visit (INDEPENDENT_AMBULATORY_CARE_PROVIDER_SITE_OTHER): Payer: Medicare HMO | Admitting: Cardiology

## 2014-06-12 ENCOUNTER — Encounter: Payer: Self-pay | Admitting: Cardiology

## 2014-06-12 VITALS — BP 136/63 | HR 64 | Ht 70.0 in | Wt 153.0 lb

## 2014-06-12 DIAGNOSIS — I5022 Chronic systolic (congestive) heart failure: Secondary | ICD-10-CM

## 2014-06-12 NOTE — Patient Instructions (Signed)
Your physician recommends that you schedule a follow-up appointment in: 6 months with Dr. Hochrein  

## 2014-06-12 NOTE — Telephone Encounter (Signed)
Per Dr.McLean patient referred to [facility] for CKD referral faxed and office return fax with appointment update Patient is rated a 3 on a 1-4 scale Pt should receive appointment in 2-3 months

## 2014-06-12 NOTE — Progress Notes (Signed)
HPI The patient presents for followup. He has a complicated past history with coronary artery disease and cardiomyopathy. He has had a reduced EF of 30-35%. Cardiac catheterization demonstrated nonobstructive disease.   He also has had atrial fibrillation. Status post CRT. He has renal insufficiency. He has a bioprosthetic aortic valve. He's had recurrent hospital admissions with volume overload. The last of these was in February. He has been seen since then in the device clinic and also the advanced heart failure clinic.  Of note he was discharged on when necessary home oxygen. He's to have a sleep study to rule out apnea. He actually thinks he is doing relatively well. His weights have been stable. Seems like his home weight is about 154 pounds. He's only had to take diuretics one time in an extra dose because he felt short of breath. He's not having any new PND or orthopnea. He's not having any weight gain. He denies any chest pressure, neck or arm discomfort. His wife keeps meticulous records of his blood pressure and oxygen saturation.  Allergies  Allergen Reactions  . Ambien [Zolpidem] Other (See Comments)    Hallucinations  . Ativan [Lorazepam] Other (See Comments)    hallucinations  . Oxycontin [Oxycodone] Other (See Comments)    hallucinations  . Penicillins     Unknown childhood reaction   . Sulfa Antibiotics     unknown    Current Outpatient Prescriptions  Medication Sig Dispense Refill  . acetaminophen (TYLENOL) 500 MG tablet Take 1,000 mg by mouth at bedtime.     Marland Kitchen allopurinol (ZYLOPRIM) 100 MG tablet Take 200 mg by mouth daily.    Marland Kitchen amiodarone (PACERONE) 200 MG tablet Take 1 tablet (200 mg total) by mouth daily. 90 tablet 3  . atorvastatin (LIPITOR) 40 MG tablet Take 40 mg by mouth every evening.    . Calcium Carb-Cholecalciferol (CALCIUM 600 + D PO) Take 2 tablets by mouth every morning.    . carvedilol (COREG) 6.25 MG tablet Take 1 tablet (6.25 mg total) by mouth 2 (two)  times daily. 180 tablet 3  . clopidogrel (PLAVIX) 75 MG tablet Take 75 mg by mouth daily.    . DULoxetine (CYMBALTA) 60 MG capsule Take 60 mg by mouth daily.     . hydrALAZINE (APRESOLINE) 25 MG tablet Take 0.5 tablets (12.5 mg total) by mouth 2 (two) times daily. 90 tablet 6  . isosorbide mononitrate (IMDUR) 30 MG 24 hr tablet Take 1 tablet (30 mg total) by mouth daily. 30 tablet 6  . metolazone (ZAROXOLYN) 5 MG tablet Take 1 tablet (5 mg total) by mouth as needed. FOR WEIGHT 163 LB OR GREATER (Patient taking differently: Take 5 mg by mouth as needed (fluid). ) 30 tablet 3  . Omega-3 Fatty Acids (FISH OIL) 1200 MG CAPS Take 1 capsule by mouth every morning.    Marland Kitchen omeprazole (PRILOSEC) 40 MG capsule Take 40 mg by mouth every morning.    . potassium chloride SA (K-DUR,KLOR-CON) 20 MEQ tablet Take 1 tablet (20 mEq total) by mouth as needed. TAKE ONLY WHEN YOU TAKE METOLAZONE 30 tablet 3  . spironolactone (ALDACTONE) 25 MG tablet Take 1 tablet (25 mg total) by mouth daily. 90 tablet 3  . tamsulosin (FLOMAX) 0.4 MG CAPS capsule Take 1 capsule (0.4 mg total) by mouth at bedtime. 90 capsule 3  . torsemide (DEMADEX) 20 MG tablet Take 1 tablet (20 mg total) by mouth 2 (two) times daily. Take 40 mg in am and  20 mg in pm 90 tablet 3  . warfarin (COUMADIN) 4 MG tablet Take 2-4 mg by mouth See admin instructions. Take 4 mg on Monday, Tuesday, Wednesday and 2 mg on Thursday, Friday, Saturday and Sunday     No current facility-administered medications for this visit.    Past Medical History  Diagnosis Date  . Arthritis   . Non-obstructive CAD     a. 12/2013 NSTEMI/Cath: LM nl, LAD 20, LCX 40-50, RCA 20.  Marland Kitchen Hypertension   . Stroke     a. July 2000;  b. January 2007;  c. TIA in 2014.  . CKD (chronic kidney disease), stage III   . GERD (gastroesophageal reflux disease)   . Foot pain, bilateral   . H/O hematuria   . Hyperlipidemia   . Vitamin D deficiency   . Chronic combined systolic and diastolic CHF  (congestive heart failure)     a. 12/2013 Echo: EF 30-35%, mod conc LVH, Gr 3 DD, mild AI, mod-sev MR, mod dil LA, mild TR.  Marland Kitchen PAF (paroxysmal atrial fibrillation)     a. CHA2DS2VASc = 7--> Chronic Coumadin.  Marland Kitchen NICM (nonischemic cardiomyopathy)     a. 12/2013 Echo: EF 30-35%.  . Aortic valve prosthesis present     a. 2006: S/P bioprosthetic AVR.  Marland Kitchen PAD (peripheral artery disease)     a. s/p LLE stenting.  . Carotid arterial disease     a. s/p R CEA  . History of tobacco abuse   . Moderate to Severe Mitral Regurgitation     a. 12/2013 Echo: Mod-Sev MR.  Marland Kitchen Anxiety   . COPD (chronic obstructive pulmonary disease)   . Coronary arteriosclerosis Oct 2015    med Rx    Past Surgical History  Procedure Laterality Date  . Aortic valve replacement (avr)/coronary artery bypass grafting (cabg)  12/2008  . Eye surgery    . Carotid angiogram  1996  . Left lower extremity stent    . Carotid endarterectomy    . Insert / replace / remove pacemaker    . Coronary angiogram  Oct 2015  . Pacemaker insertion  Nov 2015    MDT BiV  . Left and right heart catheterization with coronary angiogram N/A 01/14/2014    Procedure: LEFT AND RIGHT HEART CATHETERIZATION WITH CORONARY ANGIOGRAM;  Surgeon: Peter M Swaziland, MD;  Location: [facility] CATH LAB;  Service: Cardiovascular;  Laterality: N/A;  . Bi-ventricular pacemaker insertion N/A 02/17/2014    Procedure: BI-VENTRICULAR PACEMAKER INSERTION (CRT-P);  Surgeon: Marinus Maw, MD;  Location: [facility] CATH LAB;  Service: Cardiovascular;  Laterality: N/A;    ROS:  As stated in the HPI and negative for all other systems.  PHYSICAL EXAM BP 136/63 mmHg  Pulse 64  Ht 5\' 10"  (1.778 m)  Wt 153 lb (69.4 kg)  BMI 21.95 kg/m2 GENERAL:  Frail appearing but no distress HEENT:  Pupils equal round and reactive, fundi not visualized, oral mucosa unremarkable NECK:  No jugular venous distention, waveform within normal limits, carotid upstroke brisk and symmetric, no bruits, no  thyromegaly LYMPHATICS:  No cervical, inguinal adenopathy LUNGS:  Clear to auscultation bilaterally BACK:  No CVA tenderness CHEST:  Unremarkable HEART:  PMI not displaced or sustained,S1 and S2 within normal limits, no S3, no S4, no clicks, no rubs, 3/6 apical holosystolic whistling murmur, no diastolic murmurs ABD:  Flat, positive bowel sounds normal in frequency in pitch, no bruits, no rebound, no guarding, no midline pulsatile mass, no hepatomegaly, no splenomegaly EXT:  2 plus pulses throughout, trace edema, no cyanosis no clubbing SKIN:  No rashes no nodules NEURO:  Cranial nerves II through XII grossly intact, motor grossly intact throughout PSYCH:  Cognitively intact, oriented to person place and time   ASSESSMENT AND PLAN  CHRONIC SYSTOLIC AND DIASTOLIC HF:    He seems to be euvolemic.  He now has home Lasix to be given if he needs it. I think this is reasonable.  I will be looking for the results of his basic metabolic profile. We had a long discussion about when necessary diuretics. At this point no change in therapy is indicated.  ATRIAL FIB:  He is in sinus rhythm. He will continue with meds as listed. He tolerates anticoagulation.  CHADVASC score = 5.   Of note he remains on Plavix as well because of TIAs.  CAD:  Nonobstructive.  CKD:  We will follow this closely.  Labs were drawn by home health. I will be looking for these.  RULE OUT SLEEP APNEA: He is going to have a sleep study and I suggested he go ahead and continue to comply with this.  Hospital records and it p.m. heart failure clinic records reviewed.

## 2014-06-13 ENCOUNTER — Telehealth: Payer: Self-pay | Admitting: *Deleted

## 2014-06-13 ENCOUNTER — Telehealth: Payer: Self-pay | Admitting: Cardiology

## 2014-06-13 NOTE — Telephone Encounter (Signed)
Pt's wife was returning Nathan's call and she stated that the pt's Home Health provider was [facility].   Thanks

## 2014-06-13 NOTE — Telephone Encounter (Signed)
Contacted [facility] to get BMP results per Dr. Jenene Slicker request. They have a recent result that they will fax.

## 2014-06-13 NOTE — Telephone Encounter (Signed)
Results of recent labwork sent to PCP. Pt informed of labwork.

## 2014-06-13 NOTE — Telephone Encounter (Signed)
Fax received, put to file. Sent Dr. Antoine Poche message on lab values.

## 2014-06-16 ENCOUNTER — Encounter: Payer: Self-pay | Admitting: Cardiology

## 2014-06-26 ENCOUNTER — Encounter: Payer: Self-pay | Admitting: Internal Medicine

## 2014-07-07 ENCOUNTER — Other Ambulatory Visit (HOSPITAL_COMMUNITY): Payer: Self-pay | Admitting: *Deleted

## 2014-07-07 DIAGNOSIS — I5022 Chronic systolic (congestive) heart failure: Secondary | ICD-10-CM

## 2014-07-07 MED ORDER — HYDRALAZINE HCL 25 MG PO TABS: 12.5000 mg | ORAL_TABLET | Freq: Two times a day (BID) | ORAL | Status: DC

## 2014-07-08 ENCOUNTER — Other Ambulatory Visit: Payer: Self-pay

## 2014-07-08 MED ORDER — CARVEDILOL 6.25 MG PO TABS: 6.2500 mg | ORAL_TABLET | Freq: Two times a day (BID) | ORAL | Status: DC

## 2014-07-18 ENCOUNTER — Telehealth (HOSPITAL_COMMUNITY): Payer: Self-pay | Admitting: Cardiology

## 2014-07-18 NOTE — Telephone Encounter (Signed)
pts wife  Called to report desaturations without wearing O2 sats 87-93%. Once O2 started sats remain above 90%  Pt only uses O2 as needed and at rest Increase in fatigue (however pt was very busy around house 3 days prior) Denies SOB, edema, or cough Weight stable at 150-151 Pt reported he was very dry yesterday 07/17/14, so he increased po liquids for the day   Advised pts wife to keep close watch over the weekend and call office for on cal provider for further instructions As he seems stable at this time May need to increase O2 usage (may need to start using O2 with exertion)

## 2014-07-19 ENCOUNTER — Telehealth: Payer: Self-pay | Admitting: Cardiology

## 2014-07-19 ENCOUNTER — Inpatient Hospital Stay (HOSPITAL_COMMUNITY)
Admission: EM | Admit: 2014-07-19 | Discharge: 2014-08-07 | DRG: 193 | Disposition: A | Discharge status: Skilled Nursing Facility | Payer: Medicare HMO | Attending: Family Medicine | Admitting: Family Medicine

## 2014-07-19 DIAGNOSIS — R197 Diarrhea, unspecified: Secondary | ICD-10-CM | POA: Diagnosis present

## 2014-07-19 DIAGNOSIS — I429 Cardiomyopathy, unspecified: Secondary | ICD-10-CM | POA: Diagnosis present

## 2014-07-19 DIAGNOSIS — Z823 Family history of stroke: Secondary | ICD-10-CM

## 2014-07-19 DIAGNOSIS — N179 Acute kidney failure, unspecified: Secondary | ICD-10-CM | POA: Diagnosis present

## 2014-07-19 DIAGNOSIS — W06XXXA Fall from bed, initial encounter: Secondary | ICD-10-CM | POA: Diagnosis not present

## 2014-07-19 DIAGNOSIS — T462X5A Adverse effect of other antidysrhythmic drugs, initial encounter: Secondary | ICD-10-CM | POA: Diagnosis present

## 2014-07-19 DIAGNOSIS — Y92231 Patient bathroom in hospital as the place of occurrence of the external cause: Secondary | ICD-10-CM

## 2014-07-19 DIAGNOSIS — I69354 Hemiplegia and hemiparesis following cerebral infarction affecting left non-dominant side: Secondary | ICD-10-CM

## 2014-07-19 DIAGNOSIS — I340 Nonrheumatic mitral (valve) insufficiency: Secondary | ICD-10-CM | POA: Diagnosis present

## 2014-07-19 DIAGNOSIS — R29898 Other symptoms and signs involving the musculoskeletal system: Secondary | ICD-10-CM | POA: Insufficient documentation

## 2014-07-19 DIAGNOSIS — R0902 Hypoxemia: Secondary | ICD-10-CM

## 2014-07-19 DIAGNOSIS — R338 Other retention of urine: Secondary | ICD-10-CM | POA: Diagnosis present

## 2014-07-19 DIAGNOSIS — E785 Hyperlipidemia, unspecified: Secondary | ICD-10-CM | POA: Diagnosis present

## 2014-07-19 DIAGNOSIS — K219 Gastro-esophageal reflux disease without esophagitis: Secondary | ICD-10-CM | POA: Diagnosis present

## 2014-07-19 DIAGNOSIS — I5042 Chronic combined systolic (congestive) and diastolic (congestive) heart failure: Secondary | ICD-10-CM | POA: Diagnosis present

## 2014-07-19 DIAGNOSIS — I639 Cerebral infarction, unspecified: Secondary | ICD-10-CM

## 2014-07-19 DIAGNOSIS — Z953 Presence of xenogenic heart valve: Secondary | ICD-10-CM

## 2014-07-19 DIAGNOSIS — I6522 Occlusion and stenosis of left carotid artery: Secondary | ICD-10-CM | POA: Diagnosis present

## 2014-07-19 DIAGNOSIS — Z95828 Presence of other vascular implants and grafts: Secondary | ICD-10-CM

## 2014-07-19 DIAGNOSIS — R9389 Abnormal findings on diagnostic imaging of other specified body structures: Secondary | ICD-10-CM | POA: Insufficient documentation

## 2014-07-19 DIAGNOSIS — I739 Peripheral vascular disease, unspecified: Secondary | ICD-10-CM | POA: Diagnosis present

## 2014-07-19 DIAGNOSIS — N183 Chronic kidney disease, stage 3 unspecified: Secondary | ICD-10-CM | POA: Diagnosis present

## 2014-07-19 DIAGNOSIS — T380X5A Adverse effect of glucocorticoids and synthetic analogues, initial encounter: Secondary | ICD-10-CM | POA: Diagnosis present

## 2014-07-19 DIAGNOSIS — I5023 Acute on chronic systolic (congestive) heart failure: Secondary | ICD-10-CM | POA: Diagnosis present

## 2014-07-19 DIAGNOSIS — Z952 Presence of prosthetic heart valve: Secondary | ICD-10-CM

## 2014-07-19 DIAGNOSIS — R0602 Shortness of breath: Secondary | ICD-10-CM | POA: Diagnosis not present

## 2014-07-19 DIAGNOSIS — Z9981 Dependence on supplemental oxygen: Secondary | ICD-10-CM

## 2014-07-19 DIAGNOSIS — R569 Unspecified convulsions: Secondary | ICD-10-CM | POA: Diagnosis not present

## 2014-07-19 DIAGNOSIS — R1310 Dysphagia, unspecified: Secondary | ICD-10-CM | POA: Diagnosis present

## 2014-07-19 DIAGNOSIS — Z9889 Other specified postprocedural states: Secondary | ICD-10-CM | POA: Insufficient documentation

## 2014-07-19 DIAGNOSIS — Z87891 Personal history of nicotine dependence: Secondary | ICD-10-CM

## 2014-07-19 DIAGNOSIS — Z951 Presence of aortocoronary bypass graft: Secondary | ICD-10-CM

## 2014-07-19 DIAGNOSIS — Z885 Allergy status to narcotic agent status: Secondary | ICD-10-CM

## 2014-07-19 DIAGNOSIS — I129 Hypertensive chronic kidney disease with stage 1 through stage 4 chronic kidney disease, or unspecified chronic kidney disease: Secondary | ICD-10-CM | POA: Diagnosis present

## 2014-07-19 DIAGNOSIS — Y92230 Patient room in hospital as the place of occurrence of the external cause: Secondary | ICD-10-CM

## 2014-07-19 DIAGNOSIS — R4701 Aphasia: Secondary | ICD-10-CM | POA: Diagnosis present

## 2014-07-19 DIAGNOSIS — Z7902 Long term (current) use of antithrombotics/antiplatelets: Secondary | ICD-10-CM

## 2014-07-19 DIAGNOSIS — D649 Anemia, unspecified: Secondary | ICD-10-CM | POA: Diagnosis present

## 2014-07-19 DIAGNOSIS — Y95 Nosocomial condition: Secondary | ICD-10-CM | POA: Diagnosis present

## 2014-07-19 DIAGNOSIS — Z880 Allergy status to penicillin: Secondary | ICD-10-CM

## 2014-07-19 DIAGNOSIS — G459 Transient cerebral ischemic attack, unspecified: Secondary | ICD-10-CM | POA: Diagnosis present

## 2014-07-19 DIAGNOSIS — J209 Acute bronchitis, unspecified: Secondary | ICD-10-CM | POA: Diagnosis present

## 2014-07-19 DIAGNOSIS — J8410 Pulmonary fibrosis, unspecified: Secondary | ICD-10-CM

## 2014-07-19 DIAGNOSIS — I959 Hypotension, unspecified: Secondary | ICD-10-CM | POA: Diagnosis not present

## 2014-07-19 DIAGNOSIS — N401 Enlarged prostate with lower urinary tract symptoms: Secondary | ICD-10-CM | POA: Diagnosis present

## 2014-07-19 DIAGNOSIS — E46 Unspecified protein-calorie malnutrition: Secondary | ICD-10-CM | POA: Diagnosis present

## 2014-07-19 DIAGNOSIS — W1830XA Fall on same level, unspecified, initial encounter: Secondary | ICD-10-CM | POA: Diagnosis not present

## 2014-07-19 DIAGNOSIS — Z7901 Long term (current) use of anticoagulants: Secondary | ICD-10-CM

## 2014-07-19 DIAGNOSIS — J441 Chronic obstructive pulmonary disease with (acute) exacerbation: Secondary | ICD-10-CM | POA: Diagnosis present

## 2014-07-19 DIAGNOSIS — Z888 Allergy status to other drugs, medicaments and biological substances status: Secondary | ICD-10-CM

## 2014-07-19 DIAGNOSIS — G451 Carotid artery syndrome (hemispheric): Secondary | ICD-10-CM

## 2014-07-19 DIAGNOSIS — I428 Other cardiomyopathies: Secondary | ICD-10-CM

## 2014-07-19 DIAGNOSIS — J189 Pneumonia, unspecified organism: Secondary | ICD-10-CM | POA: Diagnosis not present

## 2014-07-19 DIAGNOSIS — J984 Other disorders of lung: Secondary | ICD-10-CM | POA: Insufficient documentation

## 2014-07-19 DIAGNOSIS — I252 Old myocardial infarction: Secondary | ICD-10-CM

## 2014-07-19 DIAGNOSIS — J9601 Acute respiratory failure with hypoxia: Secondary | ICD-10-CM | POA: Diagnosis present

## 2014-07-19 DIAGNOSIS — R918 Other nonspecific abnormal finding of lung field: Secondary | ICD-10-CM

## 2014-07-19 DIAGNOSIS — I2510 Atherosclerotic heart disease of native coronary artery without angina pectoris: Secondary | ICD-10-CM | POA: Diagnosis present

## 2014-07-19 DIAGNOSIS — I5022 Chronic systolic (congestive) heart failure: Secondary | ICD-10-CM | POA: Diagnosis present

## 2014-07-19 DIAGNOSIS — Z882 Allergy status to sulfonamides status: Secondary | ICD-10-CM

## 2014-07-19 DIAGNOSIS — Z66 Do not resuscitate: Secondary | ICD-10-CM | POA: Diagnosis present

## 2014-07-19 DIAGNOSIS — I480 Paroxysmal atrial fibrillation: Secondary | ICD-10-CM | POA: Diagnosis present

## 2014-07-19 DIAGNOSIS — Z950 Presence of cardiac pacemaker: Secondary | ICD-10-CM

## 2014-07-19 DIAGNOSIS — I6529 Occlusion and stenosis of unspecified carotid artery: Secondary | ICD-10-CM | POA: Insufficient documentation

## 2014-07-19 DIAGNOSIS — Z79899 Other long term (current) drug therapy: Secondary | ICD-10-CM

## 2014-07-19 DIAGNOSIS — J440 Chronic obstructive pulmonary disease with acute lower respiratory infection: Secondary | ICD-10-CM | POA: Diagnosis present

## 2014-07-19 DIAGNOSIS — J9600 Acute respiratory failure, unspecified whether with hypoxia or hypercapnia: Secondary | ICD-10-CM | POA: Diagnosis present

## 2014-07-19 HISTORY — DX: Major depressive disorder, single episode, unspecified: F32.9

## 2014-07-19 HISTORY — DX: Depression, unspecified: F32.A

## 2014-07-19 HISTORY — DX: Cardiac murmur, unspecified: R01.1

## 2014-07-19 HISTORY — DX: Reserved for inherently not codable concepts without codable children: IMO0001

## 2014-07-19 HISTORY — DX: Presence of cardiac pacemaker: Z95.0

## 2014-07-19 NOTE — ED Provider Notes (Signed)
CSN: [id number]     Arrival date & time 07/19/14  2355 History   This chart was scribed for Tomasita Crumble, MD by Evon Slack, ED Scribe. This patient was seen in room D34C/D34C and the patient's care was started at 12:30 AM.     Chief Complaint  Patient presents with  . Shortness of Breath   Patient is a 81 y.o. male presenting with shortness of breath. The history is provided by the patient and the spouse. No language interpreter was used.  Shortness of Breath Severity:  Moderate Onset quality:  Gradual Duration:  3 days Timing:  Constant Progression:  Worsening Relieved by:  Nothing Ineffective treatments:  Oxygen Associated symptoms: cough and wheezing    HPI Comments: Timothy Durham is a 81 y.o. male with PMHx listed below brought in by ambulance, who presents to the Emergency Department complaining of worsening SOB onset 3 days prior. Pt has associated cough, wheezing and chills. Wife states that he has been compliant with all medications. Wife states that he sleeps with oxygen normally at night but states that he has been needing it earlier in the day around 3 PM. Pt has a HX of CHF and COPD and that this doesn't feel like either Pt also reports some left hip pain that he rates 4/10 from a fall 1 day ago. Pt denies leg swelling or other related symptoms. Pt is currently on coumadin and his last coumadin level was 2.3. Wife states that he has has lost about 30 pounds since October 2015.  Past Medical History  Diagnosis Date  . Arthritis   . Non-obstructive CAD     a. 12/2013 NSTEMI/Cath: LM nl, LAD 20, LCX 40-50, RCA 20.  Marland Kitchen Hypertension   . Stroke     a. July 2000;  b. January 2007;  c. TIA in 2014.  . CKD (chronic kidney disease), stage III   . GERD (gastroesophageal reflux disease)   . Foot pain, bilateral   . H/O hematuria   . Hyperlipidemia   . Vitamin D deficiency   . Chronic combined systolic and diastolic CHF (congestive heart failure)     a. 12/2013 Echo: EF 30-35%,  mod conc LVH, Gr 3 DD, mild AI, mod-sev MR, mod dil LA, mild TR.  Marland Kitchen PAF (paroxysmal atrial fibrillation)     a. CHA2DS2VASc = 7--> Chronic Coumadin.  Marland Kitchen NICM (nonischemic cardiomyopathy)     a. 12/2013 Echo: EF 30-35%.  . Aortic valve prosthesis present     a. 2006: S/P bioprosthetic AVR.  Marland Kitchen PAD (peripheral artery disease)     a. s/p LLE stenting.  . Carotid arterial disease     a. s/p R CEA  . History of tobacco abuse   . Moderate to Severe Mitral Regurgitation     a. 12/2013 Echo: Mod-Sev MR.  Marland Kitchen Anxiety   . COPD (chronic obstructive pulmonary disease)   . Coronary arteriosclerosis Oct 2015    med Rx   Past Surgical History  Procedure Laterality Date  . Aortic valve replacement (avr)/coronary artery bypass grafting (cabg)  12/2008  . Eye surgery    . Carotid angiogram  1996  . Left lower extremity stent    . Carotid endarterectomy    . Insert / replace / remove pacemaker    . Coronary angiogram  Oct 2015  . Pacemaker insertion  Nov 2015    MDT BiV  . Left and right heart catheterization with coronary angiogram N/A 01/14/2014    Procedure: LEFT  AND RIGHT HEART CATHETERIZATION WITH CORONARY ANGIOGRAM;  Surgeon: Peter M Swaziland, MD;  Location: [facility] CATH LAB;  Service: Cardiovascular;  Laterality: N/A;  . Bi-ventricular pacemaker insertion N/A 02/17/2014    Procedure: BI-VENTRICULAR PACEMAKER INSERTION (CRT-P);  Surgeon: Marinus Maw, MD;  Location: [facility] CATH LAB;  Service: Cardiovascular;  Laterality: N/A;   Family History  Problem Relation Age of Onset  . Stroke Mother   . Cancer Father    History  Substance Use Topics  . Smoking status: Former Smoker    Quit date: 09/26/1998  . Smokeless tobacco: Never Used  . Alcohol Use: 0.6 oz/week    1 Cans of beer per week     Comment: drinks one can of beer daily.    Review of Systems  Constitutional: Positive for chills.  Respiratory: Positive for cough, shortness of breath and wheezing.   Cardiovascular: Negative for leg  swelling.  All other systems reviewed and are negative.   Allergies  Ambien; Ativan; Oxycontin; Penicillins; and Sulfa antibiotics  Home Medications   Prior to Admission medications   Medication Sig Start Date End Date Taking? Authorizing Provider  acetaminophen (TYLENOL) 500 MG tablet Take 1,000 mg by mouth at bedtime.     Historical Provider, MD  allopurinol (ZYLOPRIM) 100 MG tablet Take 200 mg by mouth daily.    Historical Provider, MD  amiodarone (PACERONE) 200 MG tablet Take 1 tablet (200 mg total) by mouth daily. 03/10/14   Dolores Patty, MD  atorvastatin (LIPITOR) 40 MG tablet Take 40 mg by mouth every evening.    Historical Provider, MD  Calcium Carb-Cholecalciferol (CALCIUM 600 + D PO) Take 2 tablets by mouth every morning.    Historical Provider, MD  carvedilol (COREG) 6.25 MG tablet Take 1 tablet (6.25 mg total) by mouth 2 (two) times daily. 07/08/14   Rollene Rotunda, MD  clopidogrel (PLAVIX) 75 MG tablet Take 75 mg by mouth daily.    Historical Provider, MD  DULoxetine (CYMBALTA) 60 MG capsule Take 60 mg by mouth daily.  03/18/14   Historical Provider, MD  hydrALAZINE (APRESOLINE) 25 MG tablet Take 0.5 tablets (12.5 mg total) by mouth 2 (two) times daily. 07/07/14   Laurey Morale, MD  isosorbide mononitrate (IMDUR) 30 MG 24 hr tablet Take 1 tablet (30 mg total) by mouth daily. 05/06/14   Amy D Filbert Schilder, NP  metolazone (ZAROXOLYN) 5 MG tablet Take 1 tablet (5 mg total) by mouth as needed. FOR WEIGHT 163 LB OR GREATER Patient taking differently: Take 5 mg by mouth as needed (fluid).  04/10/14   Dolores Patty, MD  Omega-3 Fatty Acids (FISH OIL) 1200 MG CAPS Take 1 capsule by mouth every morning.    Historical Provider, MD  omeprazole (PRILOSEC) 40 MG capsule Take 40 mg by mouth every morning.    Historical Provider, MD  potassium chloride SA (K-DUR,KLOR-CON) 20 MEQ tablet Take 1 tablet (20 mEq total) by mouth as needed. TAKE ONLY WHEN YOU TAKE METOLAZONE 04/10/14   Dolores Patty, MD  spironolactone (ALDACTONE) 25 MG tablet Take 1 tablet (25 mg total) by mouth daily. 06/03/14   Laurey Morale, MD  tamsulosin (FLOMAX) 0.4 MG CAPS capsule Take 1 capsule (0.4 mg total) by mouth at bedtime. 04/10/14   Dolores Patty, MD  torsemide (DEMADEX) 20 MG tablet Take 1 tablet (20 mg total) by mouth 2 (two) times daily. Take 40 mg in am and 20 mg in pm 05/30/14   Amy D Clegg,  NP  warfarin (COUMADIN) 4 MG tablet Take 2-4 mg by mouth See admin instructions. Take 4 mg on Monday, Tuesday, Wednesday and 2 mg on Thursday, Friday, Saturday and Sunday 02/24/13   Historical Provider, MD   BP 94/70 mmHg  Pulse 68  Temp(Src) 98 F (36.7 C) (Oral)  Resp 31  Ht 5\' 10"  (1.778 m)  Wt 149 lb 8 oz (67.813 kg)  BMI 21.45 kg/m2  SpO2 94%   Physical Exam  Constitutional: He is oriented to person, place, and time. Vital signs are normal. He appears well-developed and well-nourished.  Non-toxic appearance. He does not appear ill. No distress. Nasal cannula in place.  HENT:  Head: Normocephalic and atraumatic.  Nose: Nose normal.  Mouth/Throat: Oropharynx is clear and moist. No oropharyngeal exudate.  Eyes: Conjunctivae and EOM are normal. Pupils are equal, round, and reactive to light. No scleral icterus.  Neck: Normal range of motion. Neck supple. No tracheal deviation, no edema, no erythema and normal range of motion present. No thyroid mass and no thyromegaly present.  Cardiovascular: Normal rate, regular rhythm, S1 normal, S2 normal, intact distal pulses and normal pulses.  Exam reveals no gallop and no friction rub.   Murmur heard.  Systolic murmur is present  Pulses:      Radial pulses are 2+ on the right side, and 2+ on the left side.       Dorsalis pedis pulses are 2+ on the right side, and 2+ on the left side.  Bi ventricular pacemaker in place.   Pulmonary/Chest: Effort normal and breath sounds normal. No respiratory distress. He has no wheezes. He has no rhonchi. He has no  rales.  Abdominal: Soft. Normal appearance and bowel sounds are normal. He exhibits no distension, no ascites and no mass. There is no hepatosplenomegaly. There is no tenderness. There is no rebound, no guarding and no CVA tenderness.  Musculoskeletal: Normal range of motion. He exhibits no edema or tenderness.  Lymphadenopathy:    He has no cervical adenopathy.  Neurological: He is alert and oriented to person, place, and time. He has normal strength. No cranial nerve deficit or sensory deficit.  Skin: Skin is warm, dry and intact. No petechiae and no rash noted. He is not diaphoretic. No erythema. No pallor.  Psychiatric: He has a normal mood and affect. His behavior is normal. Judgment normal.  Nursing note and vitals reviewed.   ED Course  Procedures (including critical care time) DIAGNOSTIC STUDIES: Oxygen Saturation is 94% on 4L [place], adequate by my interpretation.    COORDINATION OF CARE: 12:34 AM-Discussed treatment plan with pt at bedside and pt agreed to plan.     Labs Review Labs Reviewed  CBC WITH DIFFERENTIAL/PLATELET - Abnormal; Notable for the following:    WBC 12.2 (*)    RBC 3.15 (*)    Hemoglobin 10.2 (*)    HCT 29.9 (*)    RDW 16.2 (*)    Neutrophils Relative % 92 (*)    Neutro Abs 11.3 (*)    Lymphocytes Relative 2 (*)    Lymphs Abs 0.2 (*)    All other components within normal limits  COMPREHENSIVE METABOLIC PANEL - Abnormal; Notable for the following:    Sodium 131 (*)    Chloride 92 (*)    Glucose, Bld 206 (*)    BUN 70 (*)    Creatinine, Ser 2.65 (*)    Albumin 2.9 (*)    GFR calc non Af Amer 21 (*)  GFR calc Af Amer 24 (*)    All other components within normal limits  BRAIN NATRIURETIC PEPTIDE - Abnormal; Notable for the following:    B Natriuretic Peptide 1469.3 (*)    All other components within normal limits  PROTIME-INR - Abnormal; Notable for the following:    Prothrombin Time 31.3 (*)    INR 2.99 (*)    All other components within  normal limits  CULTURE, BLOOD (ROUTINE X 2)  CULTURE, BLOOD (ROUTINE X 2)  URINALYSIS, ROUTINE W REFLEX MICROSCOPIC  I-STAT TROPOININ, ED    Imaging Review No results found.   EKG Interpretation   Date/Time:  Sunday July 20 2014 00:03:40 EDT Ventricular Rate:  77 PR Interval:  199 QRS Duration: 147 QT Interval:  440 QTC Calculation: 498 R Axis:   -76 Text Interpretation:  VENTRICULAR PACED RHYTHM No significant change since  last tracing Confirmed by Erroll Luna [phone]) on 07/20/2014  12:14:06 AM      MDM   Final diagnoses:  None   Patient since emergency department for worsening shortness of breath and cough. Temperature at home has been elevated to 99, wife states his normal temperature is 95. X-ray shows what appears to be a pneumonia.  Radiology interpretation still has not come back after several hours. Patient was given blood cultures and antibiotics and be admitted to step down unit with [facility] hospitalist for continued management. I spoke with cardiology does not believe this is fluid overload.   I personally performed the services described in this documentation, which was scribed in my presence. The recorded information has been reviewed and is accurate.   Tomasita Crumble, MD 07/20/14 [phone]

## 2014-07-19 NOTE — Telephone Encounter (Signed)
Patient's wife called to state that he has not been feeling well for several days.  He has been wheezing and coughing.  His dry weight is usually around 154lbs and he is down to 149 lbs.  He uses O2 at 2L at night but has had to use the Oxygen earlier in the afternoon the last few days.  Today his O2 sat was 89% and then went up to 93% on 3L.  The patient now asking wife to call EMS which she is going to do.

## 2014-07-20 ENCOUNTER — Inpatient Hospital Stay (HOSPITAL_COMMUNITY): Payer: Medicare HMO

## 2014-07-20 ENCOUNTER — Encounter (HOSPITAL_COMMUNITY): Payer: Self-pay

## 2014-07-20 ENCOUNTER — Emergency Department (HOSPITAL_COMMUNITY): Payer: Medicare HMO

## 2014-07-20 DIAGNOSIS — Z7901 Long term (current) use of anticoagulants: Secondary | ICD-10-CM | POA: Diagnosis not present

## 2014-07-20 DIAGNOSIS — Z888 Allergy status to other drugs, medicaments and biological substances status: Secondary | ICD-10-CM | POA: Diagnosis not present

## 2014-07-20 DIAGNOSIS — Z9981 Dependence on supplemental oxygen: Secondary | ICD-10-CM | POA: Diagnosis not present

## 2014-07-20 DIAGNOSIS — N401 Enlarged prostate with lower urinary tract symptoms: Secondary | ICD-10-CM | POA: Diagnosis present

## 2014-07-20 DIAGNOSIS — J9600 Acute respiratory failure, unspecified whether with hypoxia or hypercapnia: Secondary | ICD-10-CM | POA: Diagnosis present

## 2014-07-20 DIAGNOSIS — J9621 Acute and chronic respiratory failure with hypoxia: Secondary | ICD-10-CM | POA: Diagnosis not present

## 2014-07-20 DIAGNOSIS — I959 Hypotension, unspecified: Secondary | ICD-10-CM | POA: Diagnosis not present

## 2014-07-20 DIAGNOSIS — G458 Other transient cerebral ischemic attacks and related syndromes: Secondary | ICD-10-CM | POA: Diagnosis not present

## 2014-07-20 DIAGNOSIS — I480 Paroxysmal atrial fibrillation: Secondary | ICD-10-CM | POA: Diagnosis present

## 2014-07-20 DIAGNOSIS — K219 Gastro-esophageal reflux disease without esophagitis: Secondary | ICD-10-CM | POA: Diagnosis present

## 2014-07-20 DIAGNOSIS — I739 Peripheral vascular disease, unspecified: Secondary | ICD-10-CM | POA: Diagnosis present

## 2014-07-20 DIAGNOSIS — Y92231 Patient bathroom in hospital as the place of occurrence of the external cause: Secondary | ICD-10-CM | POA: Diagnosis not present

## 2014-07-20 DIAGNOSIS — J411 Mucopurulent chronic bronchitis: Secondary | ICD-10-CM | POA: Diagnosis not present

## 2014-07-20 DIAGNOSIS — R938 Abnormal findings on diagnostic imaging of other specified body structures: Secondary | ICD-10-CM | POA: Diagnosis not present

## 2014-07-20 DIAGNOSIS — N179 Acute kidney failure, unspecified: Secondary | ICD-10-CM | POA: Diagnosis present

## 2014-07-20 DIAGNOSIS — D649 Anemia, unspecified: Secondary | ICD-10-CM | POA: Diagnosis present

## 2014-07-20 DIAGNOSIS — J441 Chronic obstructive pulmonary disease with (acute) exacerbation: Secondary | ICD-10-CM | POA: Diagnosis not present

## 2014-07-20 DIAGNOSIS — J189 Pneumonia, unspecified organism: Secondary | ICD-10-CM | POA: Diagnosis present

## 2014-07-20 DIAGNOSIS — T380X5A Adverse effect of glucocorticoids and synthetic analogues, initial encounter: Secondary | ICD-10-CM | POA: Diagnosis present

## 2014-07-20 DIAGNOSIS — R569 Unspecified convulsions: Secondary | ICD-10-CM | POA: Diagnosis not present

## 2014-07-20 DIAGNOSIS — Z79899 Other long term (current) drug therapy: Secondary | ICD-10-CM | POA: Diagnosis not present

## 2014-07-20 DIAGNOSIS — I6522 Occlusion and stenosis of left carotid artery: Secondary | ICD-10-CM | POA: Diagnosis not present

## 2014-07-20 DIAGNOSIS — I69354 Hemiplegia and hemiparesis following cerebral infarction affecting left non-dominant side: Secondary | ICD-10-CM | POA: Diagnosis not present

## 2014-07-20 DIAGNOSIS — N183 Chronic kidney disease, stage 3 unspecified: Secondary | ICD-10-CM | POA: Diagnosis present

## 2014-07-20 DIAGNOSIS — Y95 Nosocomial condition: Secondary | ICD-10-CM | POA: Diagnosis present

## 2014-07-20 DIAGNOSIS — I2510 Atherosclerotic heart disease of native coronary artery without angina pectoris: Secondary | ICD-10-CM | POA: Diagnosis present

## 2014-07-20 DIAGNOSIS — Z953 Presence of xenogenic heart valve: Secondary | ICD-10-CM | POA: Diagnosis not present

## 2014-07-20 DIAGNOSIS — Z66 Do not resuscitate: Secondary | ICD-10-CM | POA: Diagnosis present

## 2014-07-20 DIAGNOSIS — Z95828 Presence of other vascular implants and grafts: Secondary | ICD-10-CM | POA: Diagnosis not present

## 2014-07-20 DIAGNOSIS — R338 Other retention of urine: Secondary | ICD-10-CM | POA: Diagnosis present

## 2014-07-20 DIAGNOSIS — E785 Hyperlipidemia, unspecified: Secondary | ICD-10-CM | POA: Diagnosis present

## 2014-07-20 DIAGNOSIS — J440 Chronic obstructive pulmonary disease with acute lower respiratory infection: Secondary | ICD-10-CM | POA: Diagnosis present

## 2014-07-20 DIAGNOSIS — J9601 Acute respiratory failure with hypoxia: Secondary | ICD-10-CM | POA: Diagnosis present

## 2014-07-20 DIAGNOSIS — Z950 Presence of cardiac pacemaker: Secondary | ICD-10-CM | POA: Diagnosis not present

## 2014-07-20 DIAGNOSIS — E46 Unspecified protein-calorie malnutrition: Secondary | ICD-10-CM | POA: Diagnosis present

## 2014-07-20 DIAGNOSIS — I5042 Chronic combined systolic (congestive) and diastolic (congestive) heart failure: Secondary | ICD-10-CM | POA: Diagnosis present

## 2014-07-20 DIAGNOSIS — I481 Persistent atrial fibrillation: Secondary | ICD-10-CM | POA: Diagnosis not present

## 2014-07-20 DIAGNOSIS — Z882 Allergy status to sulfonamides status: Secondary | ICD-10-CM | POA: Diagnosis not present

## 2014-07-20 DIAGNOSIS — I428 Other cardiomyopathies: Secondary | ICD-10-CM | POA: Diagnosis not present

## 2014-07-20 DIAGNOSIS — I340 Nonrheumatic mitral (valve) insufficiency: Secondary | ICD-10-CM | POA: Diagnosis present

## 2014-07-20 DIAGNOSIS — I4891 Unspecified atrial fibrillation: Secondary | ICD-10-CM | POA: Diagnosis not present

## 2014-07-20 DIAGNOSIS — R0602 Shortness of breath: Secondary | ICD-10-CM | POA: Diagnosis present

## 2014-07-20 DIAGNOSIS — G459 Transient cerebral ischemic attack, unspecified: Secondary | ICD-10-CM | POA: Diagnosis not present

## 2014-07-20 DIAGNOSIS — I252 Old myocardial infarction: Secondary | ICD-10-CM | POA: Diagnosis not present

## 2014-07-20 DIAGNOSIS — Z885 Allergy status to narcotic agent status: Secondary | ICD-10-CM | POA: Diagnosis not present

## 2014-07-20 DIAGNOSIS — R042 Hemoptysis: Secondary | ICD-10-CM | POA: Diagnosis not present

## 2014-07-20 DIAGNOSIS — I639 Cerebral infarction, unspecified: Secondary | ICD-10-CM | POA: Diagnosis not present

## 2014-07-20 DIAGNOSIS — R918 Other nonspecific abnormal finding of lung field: Secondary | ICD-10-CM | POA: Diagnosis not present

## 2014-07-20 DIAGNOSIS — J708 Respiratory conditions due to other specified external agents: Secondary | ICD-10-CM | POA: Diagnosis not present

## 2014-07-20 DIAGNOSIS — D72829 Elevated white blood cell count, unspecified: Secondary | ICD-10-CM | POA: Diagnosis not present

## 2014-07-20 DIAGNOSIS — T462X5A Adverse effect of other antidysrhythmic drugs, initial encounter: Secondary | ICD-10-CM | POA: Diagnosis not present

## 2014-07-20 DIAGNOSIS — R1310 Dysphagia, unspecified: Secondary | ICD-10-CM | POA: Diagnosis present

## 2014-07-20 DIAGNOSIS — Z880 Allergy status to penicillin: Secondary | ICD-10-CM | POA: Diagnosis not present

## 2014-07-20 DIAGNOSIS — G451 Carotid artery syndrome (hemispheric): Secondary | ICD-10-CM | POA: Diagnosis not present

## 2014-07-20 DIAGNOSIS — I482 Chronic atrial fibrillation: Secondary | ICD-10-CM | POA: Diagnosis not present

## 2014-07-20 DIAGNOSIS — I5023 Acute on chronic systolic (congestive) heart failure: Secondary | ICD-10-CM | POA: Diagnosis not present

## 2014-07-20 DIAGNOSIS — I5021 Acute systolic (congestive) heart failure: Secondary | ICD-10-CM | POA: Diagnosis not present

## 2014-07-20 DIAGNOSIS — Z87891 Personal history of nicotine dependence: Secondary | ICD-10-CM | POA: Diagnosis not present

## 2014-07-20 DIAGNOSIS — I129 Hypertensive chronic kidney disease with stage 1 through stage 4 chronic kidney disease, or unspecified chronic kidney disease: Secondary | ICD-10-CM | POA: Diagnosis present

## 2014-07-20 DIAGNOSIS — J209 Acute bronchitis, unspecified: Secondary | ICD-10-CM | POA: Diagnosis present

## 2014-07-20 DIAGNOSIS — I429 Cardiomyopathy, unspecified: Secondary | ICD-10-CM | POA: Diagnosis present

## 2014-07-20 DIAGNOSIS — Z954 Presence of other heart-valve replacement: Secondary | ICD-10-CM | POA: Diagnosis not present

## 2014-07-20 DIAGNOSIS — R29898 Other symptoms and signs involving the musculoskeletal system: Secondary | ICD-10-CM | POA: Diagnosis not present

## 2014-07-20 DIAGNOSIS — Z7902 Long term (current) use of antithrombotics/antiplatelets: Secondary | ICD-10-CM | POA: Diagnosis not present

## 2014-07-20 DIAGNOSIS — W1830XA Fall on same level, unspecified, initial encounter: Secondary | ICD-10-CM | POA: Diagnosis not present

## 2014-07-20 DIAGNOSIS — W06XXXA Fall from bed, initial encounter: Secondary | ICD-10-CM | POA: Diagnosis not present

## 2014-07-20 DIAGNOSIS — I6529 Occlusion and stenosis of unspecified carotid artery: Secondary | ICD-10-CM | POA: Diagnosis not present

## 2014-07-20 DIAGNOSIS — R0902 Hypoxemia: Secondary | ICD-10-CM | POA: Diagnosis not present

## 2014-07-20 DIAGNOSIS — Z951 Presence of aortocoronary bypass graft: Secondary | ICD-10-CM | POA: Diagnosis not present

## 2014-07-20 DIAGNOSIS — I5022 Chronic systolic (congestive) heart failure: Secondary | ICD-10-CM

## 2014-07-20 DIAGNOSIS — Y92230 Patient room in hospital as the place of occurrence of the external cause: Secondary | ICD-10-CM | POA: Diagnosis not present

## 2014-07-20 DIAGNOSIS — Z823 Family history of stroke: Secondary | ICD-10-CM | POA: Diagnosis not present

## 2014-07-20 DIAGNOSIS — R4701 Aphasia: Secondary | ICD-10-CM | POA: Diagnosis not present

## 2014-07-20 DIAGNOSIS — R197 Diarrhea, unspecified: Secondary | ICD-10-CM | POA: Diagnosis not present

## 2014-07-20 LAB — BRAIN NATRIURETIC PEPTIDE: B Natriuretic Peptide: 1469.3 pg/mL — ABNORMAL HIGH (ref 0.0–100.0)

## 2014-07-20 LAB — URINALYSIS, ROUTINE W REFLEX MICROSCOPIC
Bilirubin Urine: NEGATIVE
Glucose, UA: NEGATIVE mg/dL
HGB URINE DIPSTICK: NEGATIVE
Ketones, ur: NEGATIVE mg/dL
Leukocytes, UA: NEGATIVE
Nitrite: NEGATIVE
Protein, ur: NEGATIVE mg/dL
SPECIFIC GRAVITY, URINE: 1.014 (ref 1.005–1.030)
Urobilinogen, UA: 0.2 mg/dL (ref 0.0–1.0)
pH: 5 (ref 5.0–8.0)

## 2014-07-20 LAB — PROTIME-INR
INR: 2.99 — ABNORMAL HIGH (ref 0.00–1.49)
INR: 3.21 — ABNORMAL HIGH (ref 0.00–1.49)
PROTHROMBIN TIME: 31.3 s — AB (ref 11.6–15.2)
Prothrombin Time: 33.1 seconds — ABNORMAL HIGH (ref 11.6–15.2)

## 2014-07-20 LAB — CBC WITH DIFFERENTIAL/PLATELET
BASOS ABS: 0 10*3/uL (ref 0.0–0.1)
Basophils Relative: 0 % (ref 0–1)
EOS PCT: 1 % (ref 0–5)
Eosinophils Absolute: 0.1 10*3/uL (ref 0.0–0.7)
HCT: 29.9 % — ABNORMAL LOW (ref 39.0–52.0)
HEMOGLOBIN: 10.2 g/dL — AB (ref 13.0–17.0)
LYMPHS PCT: 2 % — AB (ref 12–46)
Lymphs Abs: 0.2 10*3/uL — ABNORMAL LOW (ref 0.7–4.0)
MCH: 32.4 pg (ref 26.0–34.0)
MCHC: 34.1 g/dL (ref 30.0–36.0)
MCV: 94.9 fL (ref 78.0–100.0)
MONOS PCT: 5 % (ref 3–12)
Monocytes Absolute: 0.6 10*3/uL (ref 0.1–1.0)
NEUTROS ABS: 11.3 10*3/uL — AB (ref 1.7–7.7)
NEUTROS PCT: 92 % — AB (ref 43–77)
PLATELETS: 242 10*3/uL (ref 150–400)
RBC: 3.15 MIL/uL — AB (ref 4.22–5.81)
RDW: 16.2 % — AB (ref 11.5–15.5)
WBC: 12.2 10*3/uL — ABNORMAL HIGH (ref 4.0–10.5)

## 2014-07-20 LAB — COMPREHENSIVE METABOLIC PANEL
ALT: 23 U/L (ref 0–53)
AST: 31 U/L (ref 0–37)
Albumin: 2.9 g/dL — ABNORMAL LOW (ref 3.5–5.2)
Alkaline Phosphatase: 92 U/L (ref 39–117)
Anion gap: 14 (ref 5–15)
BUN: 70 mg/dL — ABNORMAL HIGH (ref 6–23)
CALCIUM: 8.4 mg/dL (ref 8.4–10.5)
CO2: 25 mmol/L (ref 19–32)
CREATININE: 2.65 mg/dL — AB (ref 0.50–1.35)
Chloride: 92 mmol/L — ABNORMAL LOW (ref 96–112)
GFR calc Af Amer: 24 mL/min — ABNORMAL LOW (ref >90)
GFR, EST NON AFRICAN AMERICAN: 21 mL/min — AB (ref >90)
Glucose, Bld: 206 mg/dL — ABNORMAL HIGH (ref 70–99)
POTASSIUM: 4 mmol/L (ref 3.5–5.1)
SODIUM: 131 mmol/L — AB (ref 135–145)
Total Bilirubin: 1.2 mg/dL (ref 0.3–1.2)
Total Protein: 6.2 g/dL (ref 6.0–8.3)

## 2014-07-20 LAB — CREATININE, URINE, RANDOM: CREATININE, URINE: 81.42 mg/dL

## 2014-07-20 LAB — MRSA PCR SCREENING: MRSA by PCR: NEGATIVE

## 2014-07-20 LAB — I-STAT TROPONIN, ED: Troponin i, poc: 0.01 ng/mL (ref 0.00–0.08)

## 2014-07-20 LAB — SODIUM, URINE, RANDOM: Sodium, Ur: <10 mmol/L

## 2014-07-20 MED ORDER — SODIUM CHLORIDE 0.9 % IV BOLUS (SEPSIS)
1000.0000 mL | Freq: Once | INTRAVENOUS | Status: AC
  Administered 2014-07-20: 1000 mL via INTRAVENOUS

## 2014-07-20 MED ORDER — SODIUM CHLORIDE 0.9 % IJ SOLN
3.0000 mL | Freq: Two times a day (BID) | INTRAMUSCULAR | Status: DC
  Administered 2014-07-20 – 2014-08-06 (×25): 3 mL via INTRAVENOUS

## 2014-07-20 MED ORDER — VANCOMYCIN HCL 10 G IV SOLR
1500.0000 mg | Freq: Once | INTRAVENOUS | Status: DC
  Filled 2014-07-20: qty 1500

## 2014-07-20 MED ORDER — ALLOPURINOL 100 MG PO TABS
200.0000 mg | ORAL_TABLET | Freq: Every day | ORAL | Status: DC
  Administered 2014-07-20 – 2014-08-07 (×19): 200 mg via ORAL
  Filled 2014-07-20 (×19): qty 2

## 2014-07-20 MED ORDER — AZITHROMYCIN 250 MG PO TABS
250.0000 mg | ORAL_TABLET | Freq: Every day | ORAL | Status: DC
  Administered 2014-07-21: 250 mg via ORAL
  Filled 2014-07-20 (×2): qty 1

## 2014-07-20 MED ORDER — IPRATROPIUM-ALBUTEROL 0.5-2.5 (3) MG/3ML IN SOLN
3.0000 mL | Freq: Four times a day (QID) | RESPIRATORY_TRACT | Status: DC
  Administered 2014-07-21 – 2014-07-24 (×16): 3 mL via RESPIRATORY_TRACT
  Filled 2014-07-20 (×16): qty 3

## 2014-07-20 MED ORDER — TAMSULOSIN HCL 0.4 MG PO CAPS
0.4000 mg | ORAL_CAPSULE | Freq: Every day | ORAL | Status: DC
  Administered 2014-07-20 – 2014-07-26 (×7): 0.4 mg via ORAL
  Filled 2014-07-20 (×8): qty 1

## 2014-07-20 MED ORDER — AMIODARONE HCL 200 MG PO TABS
200.0000 mg | ORAL_TABLET | Freq: Every day | ORAL | Status: DC
  Administered 2014-07-20 – 2014-07-28 (×9): 200 mg via ORAL
  Filled 2014-07-20 (×9): qty 1

## 2014-07-20 MED ORDER — CLOPIDOGREL BISULFATE 75 MG PO TABS
75.0000 mg | ORAL_TABLET | Freq: Every day | ORAL | Status: DC
  Administered 2014-07-20 – 2014-07-28 (×9): 75 mg via ORAL
  Filled 2014-07-20 (×9): qty 1

## 2014-07-20 MED ORDER — ACETAMINOPHEN 500 MG PO TABS
1000.0000 mg | ORAL_TABLET | Freq: Four times a day (QID) | ORAL | Status: DC | PRN
  Administered 2014-07-20 – 2014-07-21 (×3): 1000 mg via ORAL
  Filled 2014-07-20 (×4): qty 2

## 2014-07-20 MED ORDER — WARFARIN SODIUM 2 MG PO TABS
2.0000 mg | ORAL_TABLET | Freq: Every day | ORAL | Status: DC
  Filled 2014-07-20: qty 1

## 2014-07-20 MED ORDER — DULOXETINE HCL 60 MG PO CPEP
60.0000 mg | ORAL_CAPSULE | Freq: Every day | ORAL | Status: DC
  Administered 2014-07-20 – 2014-08-07 (×19): 60 mg via ORAL
  Filled 2014-07-20 (×19): qty 1

## 2014-07-20 MED ORDER — PANTOPRAZOLE SODIUM 40 MG PO TBEC
80.0000 mg | DELAYED_RELEASE_TABLET | Freq: Every day | ORAL | Status: DC
  Administered 2014-07-20 – 2014-08-07 (×19): 80 mg via ORAL
  Filled 2014-07-20 (×21): qty 2

## 2014-07-20 MED ORDER — ALBUTEROL SULFATE (2.5 MG/3ML) 0.083% IN NEBU
2.5000 mg | INHALATION_SOLUTION | RESPIRATORY_TRACT | Status: DC | PRN
  Administered 2014-07-21 – 2014-07-26 (×4): 2.5 mg via RESPIRATORY_TRACT
  Filled 2014-07-20 (×3): qty 3

## 2014-07-20 MED ORDER — ATORVASTATIN CALCIUM 40 MG PO TABS
40.0000 mg | ORAL_TABLET | Freq: Every evening | ORAL | Status: DC
  Administered 2014-07-20 – 2014-08-06 (×18): 40 mg via ORAL
  Filled 2014-07-20 (×19): qty 1

## 2014-07-20 MED ORDER — ALBUTEROL SULFATE (2.5 MG/3ML) 0.083% IN NEBU
2.5000 mg | INHALATION_SOLUTION | RESPIRATORY_TRACT | Status: DC
  Administered 2014-07-20: 2.5 mg via RESPIRATORY_TRACT
  Filled 2014-07-20: qty 3

## 2014-07-20 MED ORDER — ISOSORBIDE MONONITRATE ER 30 MG PO TB24
30.0000 mg | ORAL_TABLET | Freq: Every day | ORAL | Status: DC
  Administered 2014-07-20 – 2014-08-05 (×17): 30 mg via ORAL
  Filled 2014-07-20 (×17): qty 1

## 2014-07-20 MED ORDER — DEXTROSE 5 % IV SOLN
2.0000 g | Freq: Once | INTRAVENOUS | Status: DC
  Filled 2014-07-20: qty 2

## 2014-07-20 MED ORDER — PREDNISONE 20 MG PO TABS
40.0000 mg | ORAL_TABLET | Freq: Every day | ORAL | Status: DC
  Administered 2014-07-20 – 2014-07-24 (×5): 40 mg via ORAL
  Filled 2014-07-20 (×5): qty 2

## 2014-07-20 MED ORDER — HYDRALAZINE HCL 25 MG PO TABS
12.5000 mg | ORAL_TABLET | Freq: Two times a day (BID) | ORAL | Status: DC
  Administered 2014-07-20 – 2014-08-05 (×33): 12.5 mg via ORAL
  Filled 2014-07-20 (×34): qty 0.5

## 2014-07-20 MED ORDER — CARVEDILOL 6.25 MG PO TABS
6.2500 mg | ORAL_TABLET | Freq: Two times a day (BID) | ORAL | Status: DC
  Administered 2014-07-20 – 2014-08-05 (×33): 6.25 mg via ORAL
  Filled 2014-07-20 (×35): qty 1

## 2014-07-20 MED ORDER — ALBUTEROL SULFATE (2.5 MG/3ML) 0.083% IN NEBU
2.5000 mg | INHALATION_SOLUTION | RESPIRATORY_TRACT | Status: DC | PRN
  Administered 2014-07-20 (×2): 2.5 mg via RESPIRATORY_TRACT
  Filled 2014-07-20 (×2): qty 3

## 2014-07-20 MED ORDER — AZITHROMYCIN 250 MG PO TABS
500.0000 mg | ORAL_TABLET | Freq: Once | ORAL | Status: AC
  Administered 2014-07-20: 500 mg via ORAL
  Filled 2014-07-20: qty 2

## 2014-07-20 MED ORDER — WARFARIN - PHARMACIST DOSING INPATIENT
Freq: Every day | Status: DC
  Administered 2014-07-25: 18:00:00

## 2014-07-20 NOTE — ED Notes (Signed)
The patient is unable to give an urine specimen at this time. The RN is aware. 

## 2014-07-20 NOTE — Progress Notes (Addendum)
ANTICOAGULATION CONSULT NOTE - Initial Consult  Pharmacy Consult for Coumadin Indication: atrial fibrillation  Allergies  Allergen Reactions  . Ambien [Zolpidem] Other (See Comments)    Hallucinations  . Ativan [Lorazepam] Other (See Comments)    hallucinations  . Oxycontin [Oxycodone] Other (See Comments)    hallucinations  . Penicillins     Unknown childhood reaction   . Sulfa Antibiotics     unknown    Patient Measurements: Height: 5\' 10"  (177.8 cm) Weight: 149 lb 8 oz (67.813 kg) IBW/kg (Calculated) : 73  Vital Signs: Temp: 99.1 F (37.3 C) (04/24 0045) Temp Source: Rectal (04/24 0045) BP: 120/41 mmHg (04/24 0230) Pulse Rate: 64 (04/24 0230)  Labs:  Recent Labs  07/20/14 0028  HGB 10.2*  HCT 29.9*  PLT 242  LABPROT 31.3*  INR 2.99*  CREATININE 2.65*    Estimated Creatinine Clearance: 21 mL/min (by C-G formula based on Cr of 2.65).   Medical History: Past Medical History  Diagnosis Date  . Arthritis   . Non-obstructive CAD     a. 12/2013 NSTEMI/Cath: LM nl, LAD 20, LCX 40-50, RCA 20.  Marland Kitchen Hypertension   . Stroke     a. July 2000;  b. January 2007;  c. TIA in 2014.  . CKD (chronic kidney disease), stage III   . GERD (gastroesophageal reflux disease)   . Foot pain, bilateral   . H/O hematuria   . Hyperlipidemia   . Vitamin D deficiency   . Chronic combined systolic and diastolic CHF (congestive heart failure)     a. 12/2013 Echo: EF 30-35%, mod conc LVH, Gr 3 DD, mild AI, mod-sev MR, mod dil LA, mild TR.  Marland Kitchen PAF (paroxysmal atrial fibrillation)     a. CHA2DS2VASc = 7--> Chronic Coumadin.  Marland Kitchen NICM (nonischemic cardiomyopathy)     a. 12/2013 Echo: EF 30-35%.  . Aortic valve prosthesis present     a. 2006: S/P bioprosthetic AVR.  Marland Kitchen PAD (peripheral artery disease)     a. s/p LLE stenting.  . Carotid arterial disease     a. s/p R CEA  . History of tobacco abuse   . Moderate to Severe Mitral Regurgitation     a. 12/2013 Echo: Mod-Sev MR.  Marland Kitchen Anxiety    . COPD (chronic obstructive pulmonary disease)   . Coronary arteriosclerosis Oct 2015    med Rx    Assessment: 81yo male to continue on pta coumadin for PAF (last pta dose 4/23). INR 2.99 on admit >> 3.21 (5h later, s/p 1 dose of azithro for COPD exacerbation). Also on amio po pta. To hold warfarin tonight 4/24 and hopefully restart tomorrow. No bleeding documented  PTA warfarin dose: 2mg  daily  Goal of Therapy:  INR 2-3   Plan:  Hold coumadin tonight and hopefully restart pta dose tomorrow  Monitor daily PT/INR  Mon s/sx bleed  Babs Bertin, PharmD Clinical Pharmacist - Resident Pager [phone] 07/20/2014 9:23 AM

## 2014-07-20 NOTE — Progress Notes (Signed)
Report called to RN on 3E. Pt being transferred to 3E21. Family at bedside and aware of pt transfer.

## 2014-07-20 NOTE — ED Notes (Signed)
The patient is unable to give an urine specimen. The RN is aware.

## 2014-07-20 NOTE — Progress Notes (Addendum)
[facility] PROGRESS NOTE  Timothy Durham ZOX:[id number] DOB: 1930-09-06 DOA: 07/19/2014 PCP: Dennis Bast, MD   brief narrative 81 year old male with history of nonischemic cardiomyopathy with EF of 30-35% as per last echo, NST EMI in 2015 with cardiac cath showing nonobstructive CAD, chronic kidney disease stage III, history of stroke in 2000, history of bioprosthetic aVR, paroxysmal A. fib on chronic Coumadin, tobacco use with? COPD who presented with progressive shortness of breath for the past 3 days associated with nonproductive cough and wheezing. She reports being compliant with these medications. Chest x-ray on admission showing moderate vascular congestion. Labs showed elevated BNP of 1400. Patient admitted for acute hypoxic respiratory failure likely in the setting of CHF exacerbation vs acute bronchitis.   Assessment/Plan: Acute hypoxic respiratory failure Likely associated with acute bronchitis and some degree underlying CHF. Patient still wheezy and requiring scheduled nebs. Continue oral prednisone and a Z-Pak. Resume torsemide and Aldactone.  Acute on chronic kidney disease stage III Baseline creatinine around 1.8-2. Torsemide and Aldactone held given worsened renal function. Renal ultrasound shows medical renal disease with multiple bladder diverticula. Resume both torsemide and Aldactone. Avoiding nephrotoxins.  Nonischemic cardiomyopathy -euvolemic . Continue Plavix, Coreg hydralazine and Imdur. resume torsemide and Aldactone. Continue statin. Monitor strict I/O and daily weight Not on ACEi/ ARB due to CKD  History of CVA and peripheral vascular disease Continue Plavix and statin  Atrial fibrillation Rate controlled. Continue amiodarone and Coumadin. Dosing per pharmacy  GERD Continue PPI  DVT prophylaxis: On Coumadin Diet: Heart healthy  Code Status: DO NOT RESUSCITATE Family Communication: None at bedside Disposition Plan: Monitor on telemetry. Home likely  tomorrow if shortness of breath improves.   Consultants:  None  Procedures:  None  Antibiotics:  Z-Pak  HPI/Subjective: Seen and examined. Patient requiring scheduled nebulizer given active wheezing.  Objective: Filed Vitals:   07/20/14 0900  BP:   Pulse:   Temp: 98.8 F (37.1 C)  Resp:     Intake/Output Summary (Last 24 hours) at 07/20/14 1114 Last data filed at 07/20/14 0900  Gross per 24 hour  Intake      0 ml  Output    330 ml  Net   -330 ml   Filed Weights   07/20/14 0008 07/20/14 0417  Weight: 67.813 kg (149 lb 8 oz) 69 kg (152 lb 1.9 oz)    Exam:   General:   no acute distress  HEENT:  no JVD, supple neck, moist oral mucosa  Chest: Scattered rhonchi bilaterally  CVS: S1 and S2 is irregularly irregular, no murmurs rub or gallop  GI: Soft, nondistended, nontender,   Musculoskeletal: Warm, no edema     Data Reviewed: Basic Metabolic Panel:  Recent Labs Lab 07/20/14 0028  NA 131*  K 4.0  CL 92*  CO2 25  GLUCOSE 206*  BUN 70*  CREATININE 2.65*  CALCIUM 8.4   Liver Function Tests:  Recent Labs Lab 07/20/14 0028  AST 31  ALT 23  ALKPHOS 92  BILITOT 1.2  PROT 6.2  ALBUMIN 2.9*   No results for input(s): LIPASE, AMYLASE in the last 168 hours. No results for input(s): AMMONIA in the last 168 hours. CBC:  Recent Labs Lab 07/20/14 0028  WBC 12.2*  NEUTROABS 11.3*  HGB 10.2*  HCT 29.9*  MCV 94.9  PLT 242   Cardiac Enzymes: No results for input(s): CKTOTAL, CKMB, CKMBINDEX, TROPONINI in the last 168 hours. BNP (last 3 results)  Recent Labs  05/04/14 1700 07/20/14 0028  BNP  1013.0* 1469.3*    ProBNP (last 3 results)  Recent Labs  02/24/14 1606 02/26/14 1623 03/06/14 1129  PROBNP 2973.0* 18948.0* 5943.0*    CBG: No results for input(s): GLUCAP in the last 168 hours.  Recent Results (from the past 240 hour(s))  MRSA PCR Screening     Status: None   Collection Time: 07/20/14  4:45 AM  Result Value Ref  Range Status   MRSA by PCR NEGATIVE NEGATIVE Final    Comment:        The GeneXpert MRSA Assay (FDA approved for NASAL specimens only), is one component of a comprehensive MRSA colonization surveillance program. It is not intended to diagnose MRSA infection nor to guide or monitor treatment for MRSA infections.      Studies: Dg Chest 2 View  07/20/2014   CLINICAL DATA:  Shortness of breath.  Pacemaker.  EXAM: CHEST  2 VIEW  COMPARISON:  05/04/2014.  FINDINGS: Mildly increased cardiac silhouette. Triple lead pacer unchanged from priors. Moderate vascular congestion without overt failure or significant consolidation. Mild to moderate pleural effusions, greater on the RIGHT. Aortic valvular placement with prior median sternotomy. No acute osseous findings.  IMPRESSION: Cardiomegaly with stable appearance of the transvenous pacer.  Moderate vascular congestion without overt failure. Overall improved from prior radiograph in February.   Electronically Signed   By: Davonna Belling M.D.   On: 07/20/2014 02:23    Scheduled Meds: . allopurinol  200 mg Oral Daily  . amiodarone  200 mg Oral Daily  . atorvastatin  40 mg Oral QPM  . [START ON 07/21/2014] azithromycin  250 mg Oral Daily  . carvedilol  6.25 mg Oral BID WC  . clopidogrel  75 mg Oral Daily  . DULoxetine  60 mg Oral Daily  . hydrALAZINE  12.5 mg Oral BID  . isosorbide mononitrate  30 mg Oral Daily  . pantoprazole  80 mg Oral Daily  . predniSONE  40 mg Oral Q breakfast  . sodium chloride  3 mL Intravenous Q12H  . tamsulosin  0.4 mg Oral QHS  . Warfarin - Pharmacist Dosing Inpatient   Does not apply q1800   Continuous Infusions:     Time spent: 25 minutes    Chelcy Bolda  [facility] Pager [phone]. If 7PM-7AM, please contact night-coverage at [email or web address], password [email or web address] 07/20/2014, 11:14 AM  LOS: 0 days

## 2014-07-20 NOTE — ED Notes (Signed)
MD at bedside. 

## 2014-07-20 NOTE — H&P (Addendum)
[facility] History and Physical  Timothy Durham WJX:[id number] DOB: 01-13-1931 DOA: 07/19/2014  Referring physician: EDP PCP: Dennis Bast, MD   Chief Complaint: SOB   HPI: Timothy Durham is a 81 y.o. male with history of CHF, EF 30-35%, patient presents to the ED with SOB and non-productive cough that has been worsening over the last 3 days since it started.  There has been associated wheezing and chills.  He uses O2 at night normally.  Review of Systems: Systems reviewed.  As above, otherwise negative  Past Medical History  Diagnosis Date  . Arthritis   . Non-obstructive CAD     a. 12/2013 NSTEMI/Cath: LM nl, LAD 20, LCX 40-50, RCA 20.  Marland Kitchen Hypertension   . Stroke     a. July 2000;  b. January 2007;  c. TIA in 2014.  . CKD (chronic kidney disease), stage III   . GERD (gastroesophageal reflux disease)   . Foot pain, bilateral   . H/O hematuria   . Hyperlipidemia   . Vitamin D deficiency   . Chronic combined systolic and diastolic CHF (congestive heart failure)     a. 12/2013 Echo: EF 30-35%, mod conc LVH, Gr 3 DD, mild AI, mod-sev MR, mod dil LA, mild TR.  Marland Kitchen PAF (paroxysmal atrial fibrillation)     a. CHA2DS2VASc = 7--> Chronic Coumadin.  Marland Kitchen NICM (nonischemic cardiomyopathy)     a. 12/2013 Echo: EF 30-35%.  . Aortic valve prosthesis present     a. 2006: S/P bioprosthetic AVR.  Marland Kitchen PAD (peripheral artery disease)     a. s/p LLE stenting.  . Carotid arterial disease     a. s/p R CEA  . History of tobacco abuse   . Moderate to Severe Mitral Regurgitation     a. 12/2013 Echo: Mod-Sev MR.  Marland Kitchen Anxiety   . COPD (chronic obstructive pulmonary disease)   . Coronary arteriosclerosis Oct 2015    med Rx   Past Surgical History  Procedure Laterality Date  . Aortic valve replacement (avr)/coronary artery bypass grafting (cabg)  12/2008  . Eye surgery    . Carotid angiogram  1996  . Left lower extremity stent    . Carotid endarterectomy    . Insert / replace / remove pacemaker     . Coronary angiogram  Oct 2015  . Pacemaker insertion  Nov 2015    MDT BiV  . Left and right heart catheterization with coronary angiogram N/A 01/14/2014    Procedure: LEFT AND RIGHT HEART CATHETERIZATION WITH CORONARY ANGIOGRAM;  Surgeon: Peter M Swaziland, MD;  Location: [facility] CATH LAB;  Service: Cardiovascular;  Laterality: N/A;  . Bi-ventricular pacemaker insertion N/A 02/17/2014    Procedure: BI-VENTRICULAR PACEMAKER INSERTION (CRT-P);  Surgeon: Marinus Maw, MD;  Location: [facility] CATH LAB;  Service: Cardiovascular;  Laterality: N/A;   Social History:  reports that he quit smoking about 15 years ago. He has never used smokeless tobacco. He reports that he drinks about 0.6 oz of alcohol per week. He reports that he does not use illicit drugs.  Allergies  Allergen Reactions  . Ambien [Zolpidem] Other (See Comments)    Hallucinations  . Ativan [Lorazepam] Other (See Comments)    hallucinations  . Oxycontin [Oxycodone] Other (See Comments)    hallucinations  . Penicillins     Unknown childhood reaction   . Sulfa Antibiotics     unknown    Family History  Problem Relation Age of Onset  . Stroke Mother   . Cancer  Father      Prior to Admission medications   Medication Sig Start Date End Date Taking? Authorizing Provider  acetaminophen (TYLENOL) 500 MG tablet Take 1,000 mg by mouth at bedtime.    Yes Historical Provider, MD  allopurinol (ZYLOPRIM) 100 MG tablet Take 200 mg by mouth daily.   Yes Historical Provider, MD  amiodarone (PACERONE) 200 MG tablet Take 1 tablet (200 mg total) by mouth daily. 03/10/14  Yes Dolores Patty, MD  atorvastatin (LIPITOR) 40 MG tablet Take 40 mg by mouth every evening.   Yes Historical Provider, MD  Calcium Carb-Cholecalciferol (CALCIUM 600 + D PO) Take 2 tablets by mouth every morning.   Yes Historical Provider, MD  carvedilol (COREG) 6.25 MG tablet Take 1 tablet (6.25 mg total) by mouth 2 (two) times daily. 07/08/14  Yes Rollene Rotunda, MD   clopidogrel (PLAVIX) 75 MG tablet Take 75 mg by mouth daily.   Yes Historical Provider, MD  DULoxetine (CYMBALTA) 60 MG capsule Take 60 mg by mouth daily.  03/18/14  Yes Historical Provider, MD  hydrALAZINE (APRESOLINE) 25 MG tablet Take 0.5 tablets (12.5 mg total) by mouth 2 (two) times daily. 07/07/14  Yes Laurey Morale, MD  isosorbide mononitrate (IMDUR) 30 MG 24 hr tablet Take 1 tablet (30 mg total) by mouth daily. 05/06/14  Yes Amy D Clegg, NP  metolazone (ZAROXOLYN) 5 MG tablet Take 1 tablet (5 mg total) by mouth as needed. FOR WEIGHT 163 LB OR GREATER Patient taking differently: Take 5 mg by mouth as needed (fluid).  04/10/14  Yes Dolores Patty, MD  Omega-3 Fatty Acids (FISH OIL) 1200 MG CAPS Take 1 capsule by mouth every morning.   Yes Historical Provider, MD  omeprazole (PRILOSEC) 40 MG capsule Take 40 mg by mouth every morning.   Yes Historical Provider, MD  potassium chloride SA (K-DUR,KLOR-CON) 20 MEQ tablet Take 1 tablet (20 mEq total) by mouth as needed. TAKE ONLY WHEN YOU TAKE METOLAZONE 04/10/14  Yes Dolores Patty, MD  spironolactone (ALDACTONE) 25 MG tablet Take 1 tablet (25 mg total) by mouth daily. 06/03/14  Yes Laurey Morale, MD  tamsulosin (FLOMAX) 0.4 MG CAPS capsule Take 1 capsule (0.4 mg total) by mouth at bedtime. 04/10/14  Yes Dolores Patty, MD  torsemide (DEMADEX) 20 MG tablet Take 1 tablet (20 mg total) by mouth 2 (two) times daily. Take 40 mg in am and 20 mg in pm 05/30/14  Yes Amy D Clegg, NP  warfarin (COUMADIN) 4 MG tablet Take 2 mg by mouth daily at 6 PM.  02/24/13  Yes Historical Provider, MD   Physical Exam: Filed Vitals:   07/20/14 0230  BP: 120/41  Pulse: 64  Temp:   Resp: 24    BP 120/41 mmHg  Pulse 64  Temp(Src) 99.1 F (37.3 C) (Rectal)  Resp 24  Ht  (1.778 m)  Wt 67.813 kg (149 lb 8 oz)  BMI 21.45 kg/m2  SpO2 95%  General Appearance:    Alert, oriented, no distress, appears stated age  Head:    Normocephalic, atraumatic   Eyes:    PERRL, EOMI, sclera non-icteric        Nose:   Nares without drainage or epistaxis. Mucosa, turbinates normal  Throat:   Moist mucous membranes. Oropharynx without erythema or exudate.  Neck:   Supple. No carotid bruits.  No thyromegaly.  No lymphadenopathy.   Back:     No CVA tenderness, no spinal tenderness  Lungs:  Rales bilaterally, few rhonchi at bases.  Chest wall:    No tenderness to palpitation, pacer in place  Heart:    Regular rate and rhythm, systolic murmur present.  Abdomen:     Soft, non-tender, nondistended, normal bowel sounds, no organomegaly  Genitalia:    deferred  Rectal:    deferred  Extremities:   No clubbing, cyanosis or edema.  Pulses:   2+ and symmetric all extremities  Skin:   Skin color, texture, turgor normal, no rashes or lesions  Lymph nodes:   Cervical, supraclavicular, and axillary nodes normal  Neurologic:   CNII-XII intact. Normal strength, sensation and reflexes      throughout    Labs on Admission:  Basic Metabolic Panel:  Recent Labs Lab 07/20/14 0028  NA 131*  K 4.0  CL 92*  CO2 25  GLUCOSE 206*  BUN 70*  CREATININE 2.65*  CALCIUM 8.4   Liver Function Tests:  Recent Labs Lab 07/20/14 0028  AST 31  ALT 23  ALKPHOS 92  BILITOT 1.2  PROT 6.2  ALBUMIN 2.9*   No results for input(s): LIPASE, AMYLASE in the last 168 hours. No results for input(s): AMMONIA in the last 168 hours. CBC:  Recent Labs Lab 07/20/14 0028  WBC 12.2*  NEUTROABS 11.3*  HGB 10.2*  HCT 29.9*  MCV 94.9  PLT 242   Cardiac Enzymes: No results for input(s): CKTOTAL, CKMB, CKMBINDEX, TROPONINI in the last 168 hours.  BNP (last 3 results)  Recent Labs  02/24/14 1606 02/26/14 1623 03/06/14 1129  PROBNP 2973.0* 18948.0* 5943.0*   CBG: No results for input(s): GLUCAP in the last 168 hours.  Radiological Exams on Admission: Dg Chest 2 View  07/20/2014   CLINICAL DATA:  Shortness of breath.  Pacemaker.  EXAM: CHEST  2 VIEW   COMPARISON:  05/04/2014.  FINDINGS: Mildly increased cardiac silhouette. Triple lead pacer unchanged from priors. Moderate vascular congestion without overt failure or significant consolidation. Mild to moderate pleural effusions, greater on the RIGHT. Aortic valvular placement with prior median sternotomy. No acute osseous findings.  IMPRESSION: Cardiomegaly with stable appearance of the transvenous pacer.  Moderate vascular congestion without overt failure. Overall improved from prior radiograph in February.   Electronically Signed   By: Davonna Belling M.D.   On: 07/20/2014 02:23    EKG: Independently reviewed.  Assessment/Plan Principal Problem:   Acute respiratory failure Active Problems:   Chronic systolic heart failure   Atrial fibrillation   Acute renal failure superimposed on stage 3 chronic kidney disease   COPD exacerbation   1. Acute respiratory failure - requiring 4L o2 to maintain sats.  DDx includes COPD exacerbation (most likely), CHF exacerbation (doubt given that he feels dry, and CXR is much better than feb).  PNA unlikely with negative CXR for this. 2. COPD exacerbation - 1. Adult wheeze protocol 2. Prednisone PO 3. Azithromycin 3. A.Fib - continue home meds, on coumadin 4. AKI on CKD stage 3 - creatinine up to 2.6 from 2.0 6 days ago, BUN up to 70 from 50 in same time frame. 1. Spoke with Dr. Mayford Knife: plan is to "hydrate" patient by holding his home diuretics. 2. US renal 3. Strict intake and output 4. FeNa 5. Hopefully he improves, but his creatinine has been slowly trending up since December.  Somewhat worrisome for a type 2 cardiorenal syndrome.    Code Status: Full Code  Family Communication: Family at bedside Disposition Plan: Admit to inpatient   Time spent: 29  min  GARDNER, JARED M. [facility] Pager [phone]  If 7AM-7PM, please contact the day team taking care of the patient [email or web address] Password [email or web address] 07/20/2014, 3:15 AM

## 2014-07-20 NOTE — ED Notes (Signed)
Per EMS pt started feeling Sob earlier tonight; Pt uses O2 PRN for Sob at home; pt used O2 with minimal relief. Pt was placed on 4L by EMS keeping sats at 92%. Pt hs hx CHF and Renal failure. Pt a&ox 4 on arrival and ambulates independently at home; Pt has [employer] pacemaker with ventricular pacing; pt states he fail on yesterday evening denies hitting head or LOC; pt c/o left hip pain at 4/10 but states still able to ambulate;

## 2014-07-21 LAB — BASIC METABOLIC PANEL
Anion gap: 13 (ref 5–15)
BUN: 57 mg/dL — AB (ref 6–23)
CO2: 25 mmol/L (ref 19–32)
Calcium: 8.4 mg/dL (ref 8.4–10.5)
Chloride: 93 mmol/L — ABNORMAL LOW (ref 96–112)
Creatinine, Ser: 1.94 mg/dL — ABNORMAL HIGH (ref 0.50–1.35)
GFR calc Af Amer: 36 mL/min — ABNORMAL LOW (ref >90)
GFR, EST NON AFRICAN AMERICAN: 31 mL/min — AB (ref >90)
GLUCOSE: 124 mg/dL — AB (ref 70–99)
POTASSIUM: 3.6 mmol/L (ref 3.5–5.1)
Sodium: 131 mmol/L — ABNORMAL LOW (ref 135–145)

## 2014-07-21 LAB — PROTIME-INR
INR: 2.68 — ABNORMAL HIGH (ref 0.00–1.49)
PROTHROMBIN TIME: 28.7 s — AB (ref 11.6–15.2)

## 2014-07-21 MED ORDER — SPIRONOLACTONE 25 MG PO TABS
25.0000 mg | ORAL_TABLET | Freq: Every day | ORAL | Status: DC
  Administered 2014-07-21 – 2014-07-23 (×3): 25 mg via ORAL
  Filled 2014-07-21 (×4): qty 1

## 2014-07-21 MED ORDER — WARFARIN SODIUM 1 MG PO TABS
1.0000 mg | ORAL_TABLET | Freq: Once | ORAL | Status: AC
  Administered 2014-07-21: 1 mg via ORAL
  Filled 2014-07-21: qty 1

## 2014-07-21 MED ORDER — MAGNESIUM SULFATE 2 GM/50ML IV SOLN
2.0000 g | Freq: Once | INTRAVENOUS | Status: AC
  Administered 2014-07-21: 2 g via INTRAVENOUS
  Filled 2014-07-21: qty 50

## 2014-07-21 MED ORDER — TORSEMIDE 20 MG PO TABS
20.0000 mg | ORAL_TABLET | Freq: Two times a day (BID) | ORAL | Status: DC
Start: 2014-07-21 — End: 2014-07-22
  Administered 2014-07-21 – 2014-07-22 (×2): 20 mg via ORAL
  Filled 2014-07-21 (×4): qty 1

## 2014-07-21 NOTE — Discharge Instructions (Signed)

## 2014-07-21 NOTE — Progress Notes (Signed)
ANTICOAGULATION CONSULT NOTE  Pharmacy Consult for Coumadin Indication: atrial fibrillation  Allergies  Allergen Reactions  . Ambien [Zolpidem] Other (See Comments)    Hallucinations  . Ativan [Lorazepam] Other (See Comments)    hallucinations  . Oxycontin [Oxycodone] Other (See Comments)    hallucinations  . Penicillins     Unknown childhood reaction   . Sulfa Antibiotics     unknown    Patient Measurements: Height: 5\' 10"  (177.8 cm) Weight: 150 lb 1.6 oz (68.085 kg) (b scale) IBW/kg (Calculated) : 73  Vital Signs: Temp: 97.8 F (36.6 C) (04/25 1001) Temp Source: Oral (04/25 1001) BP: 125/48 mmHg (04/25 1001) Pulse Rate: 86 (04/25 1113)  Labs:  Recent Labs  07/20/14 0028 07/20/14 0526 07/21/14 0500  HGB 10.2*  --   --   HCT 29.9*  --   --   PLT 242  --   --   LABPROT 31.3* 33.1* 28.7*  INR 2.99* 3.21* 2.68*  CREATININE 2.65*  --  1.94*    Estimated Creatinine Clearance: 28.8 mL/min (by C-G formula based on Cr of 1.94).  Assessment: 81yo male to continue on pta coumadin for PAF (last pta dose 4/23). INR 2.99 on admit >> 3.21 (5h later, s/p 1 dose of azithro for COPD exacerbation). Also on amio po pta. Warfarin held on 4/24. INR down to 2.68 this morning. No new CBC- baseline hgb 10.2, plts 242- no bleeding noted.  PTA warfarin dose: 2mg  daily  Goal of Therapy:  INR 2-3   Plan:  -warfarin 1mg  po x1 tonight with addition of prednisone and azithromycin -Monitor daily PT/INR  -follow for s/sx bleeding  Inas Avena D. Tenicia Gural, PharmD, BCPS Clinical Pharmacist Pager: [phone] 07/21/2014 11:26 AM

## 2014-07-21 NOTE — Progress Notes (Signed)
Pharmacist Heart Failure Core Measure Documentation  Assessment: Timothy Durham has an EF documented as 30-35% on 01/12/2014 by Dr. Delton See.  Rationale: Heart failure patients with left ventricular systolic dysfunction (LVSD) and an EF < 40% should be prescribed an angiotensin converting enzyme inhibitor (ACEI) or angiotensin receptor blocker (ARB) at discharge unless a contraindication is documented in the medical record.  This patient is not currently on an ACEI or ARB for HF.  This note is being placed in the record in order to provide documentation that a contraindication to the use of these agents is present for this encounter.  ACE Inhibitor or Angiotensin Receptor Blocker is contraindicated (specify all that apply)  []   ACEI allergy AND ARB allergy []   Angioedema []   Moderate or severe aortic stenosis []   Hyperkalemia []   Hypotension []   Renal artery stenosis [x]   Worsening renal function, preexisting renal disease or dysfunction   Waddell Iten 07/21/2014 3:00 PM

## 2014-07-22 ENCOUNTER — Telehealth (HOSPITAL_COMMUNITY): Payer: Self-pay | Admitting: *Deleted

## 2014-07-22 ENCOUNTER — Inpatient Hospital Stay (HOSPITAL_COMMUNITY): Payer: Medicare HMO

## 2014-07-22 DIAGNOSIS — J9621 Acute and chronic respiratory failure with hypoxia: Secondary | ICD-10-CM

## 2014-07-22 DIAGNOSIS — I5023 Acute on chronic systolic (congestive) heart failure: Secondary | ICD-10-CM

## 2014-07-22 DIAGNOSIS — I5021 Acute systolic (congestive) heart failure: Secondary | ICD-10-CM

## 2014-07-22 DIAGNOSIS — J189 Pneumonia, unspecified organism: Principal | ICD-10-CM

## 2014-07-22 LAB — BLOOD GAS, ARTERIAL
Acid-Base Excess: 1.7 mmol/L (ref 0.0–2.0)
Bicarbonate: 25.1 mEq/L — ABNORMAL HIGH (ref 20.0–24.0)
Drawn by: 29017
FIO2: 0.4 %
O2 Saturation: 95.8 %
PCO2 ART: 34.9 mmHg — AB (ref 35.0–45.0)
Patient temperature: 98.6
TCO2: 26.2 mmol/L (ref 0–100)
pH, Arterial: 7.471 — ABNORMAL HIGH (ref 7.350–7.450)
pO2, Arterial: 83.7 mmHg (ref 80.0–100.0)

## 2014-07-22 LAB — PROTIME-INR
INR: 2.24 — ABNORMAL HIGH (ref 0.00–1.49)
Prothrombin Time: 24.9 seconds — ABNORMAL HIGH (ref 11.6–15.2)

## 2014-07-22 MED ORDER — FUROSEMIDE 10 MG/ML IJ SOLN
80.0000 mg | Freq: Two times a day (BID) | INTRAMUSCULAR | Status: DC
  Administered 2014-07-22 – 2014-07-23 (×2): 80 mg via INTRAVENOUS
  Filled 2014-07-22 (×3): qty 8

## 2014-07-22 MED ORDER — WARFARIN SODIUM 1 MG PO TABS
1.0000 mg | ORAL_TABLET | Freq: Once | ORAL | Status: AC
  Administered 2014-07-22: 1 mg via ORAL
  Filled 2014-07-22: qty 1

## 2014-07-22 MED ORDER — CEFTRIAXONE SODIUM IN DEXTROSE 20 MG/ML IV SOLN
1.0000 g | INTRAVENOUS | Status: DC
  Administered 2014-07-22: 1 g via INTRAVENOUS
  Filled 2014-07-22: qty 50

## 2014-07-22 MED ORDER — AZITHROMYCIN 500 MG PO TABS
500.0000 mg | ORAL_TABLET | Freq: Every day | ORAL | Status: DC
  Filled 2014-07-22: qty 1

## 2014-07-22 MED ORDER — FUROSEMIDE 10 MG/ML IJ SOLN
40.0000 mg | Freq: Two times a day (BID) | INTRAMUSCULAR | Status: DC
  Administered 2014-07-22: 40 mg via INTRAVENOUS
  Filled 2014-07-22: qty 4

## 2014-07-22 MED ORDER — VANCOMYCIN HCL IN DEXTROSE 1-5 GM/200ML-% IV SOLN
1000.0000 mg | INTRAVENOUS | Status: DC
  Administered 2014-07-22 – 2014-07-25 (×4): 1000 mg via INTRAVENOUS
  Filled 2014-07-22 (×4): qty 200

## 2014-07-22 MED ORDER — DEXTROSE 5 % IV SOLN
1.0000 g | INTRAVENOUS | Status: DC
  Administered 2014-07-22 – 2014-07-25 (×4): 1 g via INTRAVENOUS
  Filled 2014-07-22 (×4): qty 1

## 2014-07-22 NOTE — Care Management Note (Signed)
    Page 1 of 2   08/07/2014     4:40:06 PM CARE MANAGEMENT NOTE 08/07/2014  Patient:  Timothy Durham, Timothy Durham   Account Number:  [id number]  Date Initiated:  07/20/2014  Documentation initiated by:  Whitman Hero  Subjective/Objective Assessment:   PTA from home admitted with respiratory distress.     Action/Plan:   Return to home when medically stable. CM to f/u with d/c needs.   Anticipated DC Date:  08/08/2014   Anticipated DC Plan:  SKILLED NURSING FACILITY  In-house referral  Clinical Social Worker      DC Planning Services  CM consult      Choice offered to / List presented to:             Status of service:  Completed, signed off Medicare Important Message given?  YES (If response is "NO", the following Medicare IM given date fields will be blank) Date Medicare IM given:  07/22/2014 Medicare IM given by:  Yunior Jain Date Additional Medicare IM given:  08/07/2014 Additional Medicare IM given by:  Omer Puccinelli  Discharge Disposition:  [place]  Per UR Regulation:  Reviewed for med. necessity/level of care/duration of stay  If discussed at [place] of Stay Meetings, dates discussed:   07/24/2014  07/29/2014  07/31/2014  08/05/2014  08/07/2014    Comments:  08/07/14 Timothy Lambert, RN, BSN [phone] Pt discharged to SNF today, per CSW arrangements.  08/05/14 Timothy Lambert, RN, BSN [phone] Met with pt's wife at her request to discuss dc planning for pt.  Wife overwhelmed and tearful; she does not think she can care for pt at dc, and that he will need SNF for rehab.  PT recommending HHPT unless wife unable to care for pt, then SNF.  She states last time pt was dc'd from hospital, he fell the same day of dc, cut his head open, and refused to go back to the hospital.  She states she knows he will have more falls if he goes home.  She states pt may be more open to SNF if Dr. Sung Amabile, Dr. Aundra Dubin, or Amy Clegg speak to him about it, as he greatly respects their opinion.   She gives permission for pt to be faxed out for SNF, and is interested in [facility] or [facility].  CSW notified of our conversation.  CSW to follow up with pt and wife with bed offers.  Wife/[name] [phone].  Offered emotional support to wife.  Will follow.  07/24/14 Timothy Lambert, RN, BSN [phone] PT recommending HHPT at dc; MD, please leave orders if you agree.

## 2014-07-22 NOTE — Progress Notes (Signed)
1500 Report given by 7-3 RN pt fully awake aler and oriented . Not  in acute distress. Wife in atttendance

## 2014-07-22 NOTE — Telephone Encounter (Signed)
Pts wife said pt was admitted to [place] and wants to know if a member of the heart failure team was going to come see him while he is inpatient.  I gave this information to daphne and informed pts wife someone from our team will be in to see him.

## 2014-07-22 NOTE — Progress Notes (Signed)
Pt. C/o SOB and requesting breathing treatment.  RRT paged to assess. Pt. RR RN and On call NP, K. Craige Cotta also on the floor to see pt. Pt. O2 saturation 87% on 3L O2. Pt. O2 increased to 5L. New orders placed for pt. RN will continue to monitor pt. For changes in condition.

## 2014-07-22 NOTE — Progress Notes (Signed)
ANTICOAGULATION and ANTIBIOTIC CONSULT NOTE  Pharmacy Consult for Coumadin; vancomycin/cefepime Indication: atrial fibrillation; HCAP  Allergies  Allergen Reactions  . Ambien [Zolpidem] Other (See Comments)    Hallucinations  . Ativan [Lorazepam] Other (See Comments)    hallucinations  . Oxycontin [Oxycodone] Other (See Comments)    hallucinations  . Penicillins     Unknown childhood reaction   . Sulfa Antibiotics     unknown    Patient Measurements: Height: 5\' 10"  (177.8 cm) Weight: 151 lb 3.2 oz (68.584 kg) (b scaqle) IBW/kg (Calculated) : 73  Vital Signs: Temp: 98 F (36.7 C) (04/26 0658) Temp Source: Oral (04/26 0658) BP: 103/45 mmHg (04/26 0949) Pulse Rate: 86 (04/26 0949)  Labs:  Recent Labs  07/20/14 0028 07/20/14 0526 07/21/14 0500 07/22/14 0600  HGB 10.2*  --   --   --   HCT 29.9*  --   --   --   PLT 242  --   --   --   LABPROT 31.3* 33.1* 28.7* 24.9*  INR 2.99* 3.21* 2.68* 2.24*  CREATININE 2.65*  --  1.94*  --      Recent Labs  07/20/14 0028 07/20/14 0556 07/21/14 0500  WBC 12.2*  --   --   HGB 10.2*  --   --   PLT 242  --   --   LABCREA  --  81.42  --   CREATININE 2.65*  --  1.94*   Estimated Creatinine Clearance: 29 mL/min (by C-G formula based on Cr of 1.94). No results for input(s): VANCOTROUGH, VANCOPEAK, VANCORANDOM, GENTTROUGH, GENTPEAK, GENTRANDOM, TOBRATROUGH, TOBRAPEAK, TOBRARND, AMIKACINPEAK, AMIKACINTROU, AMIKACIN in the last 72 hours.   Microbiology: Recent Results (from the past 720 hour(s))  Culture, blood (routine x 2)     Status: None (Preliminary result)   Collection Time: 07/20/14  2:10 AM  Result Value Ref Range Status   Specimen Description BLOOD RIGHT FOREARM  Final   Special Requests BOTTLES DRAWN AEROBIC AND ANAEROBIC 5CC EA  Final   Culture   Final           BLOOD CULTURE RECEIVED NO GROWTH TO DATE CULTURE WILL BE HELD FOR 5 DAYS BEFORE ISSUING A FINAL NEGATIVE REPORT Performed at [facility]     Report Status PENDING  Incomplete  Culture, blood (routine x 2)     Status: None (Preliminary result)   Collection Time: 07/20/14  2:19 AM  Result Value Ref Range Status   Specimen Description BLOOD LEFT FOREARM  Final   Special Requests BOTTLES DRAWN AEROBIC ONLY 4CC  Final   Culture   Final           BLOOD CULTURE RECEIVED NO GROWTH TO DATE CULTURE WILL BE HELD FOR 5 DAYS BEFORE ISSUING A FINAL NEGATIVE REPORT Performed at [facility]    Report Status PENDING  Incomplete  MRSA PCR Screening     Status: None   Collection Time: 07/20/14  4:45 AM  Result Value Ref Range Status   MRSA by PCR NEGATIVE NEGATIVE Final    Comment:        The GeneXpert MRSA Assay (FDA approved for NASAL specimens only), is one component of a comprehensive MRSA colonization surveillance program. It is not intended to diagnose MRSA infection nor to guide or monitor treatment for MRSA infections.     Anti-infectives    Start     Dose/Rate Route Frequency Ordered Stop   07/22/14 1000  azithromycin (ZITHROMAX) tablet 500 mg  Status:  Discontinued     500 mg Oral Daily 07/22/14 0740 07/22/14 0939   07/22/14 0800  cefTRIAXone (ROCEPHIN) 1 g in dextrose 5 % 50 mL IVPB - Premix  Status:  Discontinued     1 g 100 mL/hr over 30 Minutes Intravenous Every 24 hours 07/22/14 0740 07/22/14 0939   07/21/14 1000  azithromycin (ZITHROMAX) tablet 250 mg  Status:  Discontinued     250 mg Oral Daily 07/20/14 0311 07/22/14 0740   07/20/14 0200  vancomycin (VANCOCIN) 1,500 mg in sodium chloride 0.9 % 500 mL IVPB  Status:  Discontinued     1,500 mg 250 mL/hr over 120 Minutes Intravenous  Once 07/20/14 0145 07/20/14 0246   07/20/14 0200  ceFEPIme (MAXIPIME) 2 g in dextrose 5 % 50 mL IVPB  Status:  Discontinued     2 g 100 mL/hr over 30 Minutes Intravenous  Once 07/20/14 0145 07/20/14 0246   07/20/14 0200  azithromycin (ZITHROMAX) tablet 500 mg     500 mg Oral  Once 07/20/14 0145 07/20/14 0243       Assessment: 81yo male to continue on pta coumadin for PAF (last pta dose 4/23). INR 2.99 on admit >> 3.21 (5h later, s/p 1 dose of azithro for COPD exacerbation). INR down to 2.24 this morning after dose was held on 4/24. No new CBC- baseline hgb 10.2, plts 242- no bleeding noted.  PTA warfarin dose:  daily  Patient also had increasing SOB and tachypnea overnight. He was on azithromycin for suspected COPD exacerbation, however there was some concern for PNA on admission. She is now to start vancomycin and cefepime for HCAP. She was hospitalized in February of this year which is within 90 days, so he does still fall under the HCAP definition despite some concern on admission of CAP.  SCr 1.94 with est CrCl ~25-81mL/min. WBC increased on 4/24 at 12.2, afebrile.  Goal of Therapy:  Vancomycin trough level 15-20 mcg/ml INR 2-3   Plan:  -warfarin  po x1 tonight. Can likely resume  daily tomorrow. -Monitor daily PT/INR  -follow for s/sx bleeding -cefepime 1g IV q24h -vancomycin 1g IV q24h -f/u c/s, clinical progression, renal function, trough at SS   Alejandro Gamel D. Jerriyah Louis, PharmD, BCPS Clinical Pharmacist Pager: [phone] 07/22/2014 10:01 AM

## 2014-07-22 NOTE — Progress Notes (Signed)
RR RN paged this NP secondary to pt being SOB and dropping O2 sats. Tachypneic in the high 20s, but not in severe respiratory distress. RN progressed pt to 5L O2 per [place] and breathing improved. ? edema because pt's Lasix had been held due to creatinine. Pt was put back on po diuretics 07/21/14. NP to bedside. S: feels better now. Says he has been in hospital before with "fluid". No chest pain and not SOB now. O: Appears well. Alert and oriented. Smiling. No increased WOB. No tachypnea. CXR showed vascular congestion and no overt edema. ABG looked fine. Pt is satting  93% on 5L now and is no long tachypneic. Will try to wean O2 down with goal sat over 90% given his hx COPD. No Lasix now, continue po diuretics and follow. Jimmye Norman, NP [facility]

## 2014-07-22 NOTE — Progress Notes (Addendum)
[facility] PROGRESS NOTE  Timothy Durham UUE:[id number] DOB: 12-Jul-1930 DOA: 07/19/2014 PCP: Dennis Bast, MD   brief narrative 81 year old male with history of nonischemic cardiomyopathy with EF of 30-35% as per last echo, NST EMI in 2015 with cardiac cath showing nonobstructive CAD, chronic kidney disease stage III, history of stroke in 2000, history of bioprosthetic aVR, paroxysmal A. fib on chronic Coumadin, tobacco use with? COPD who presented with progressive shortness of breath for the past 3 days associated with nonproductive cough and wheezing. She reports being compliant with these medications. Chest x-ray on admission showing moderate vascular congestion. Labs showed elevated BNP of 1400. Patient admitted for acute hypoxic respiratory failure due to acute bronchitis. During the hospital stay he became increasingly short of breath and wheezy with follow-up chest x-ray showing multifocal pneumonia and volume overload.   Assessment/Plan: Acute hypoxic respiratory failure Initially admitted for acute bronchitis but during the hospital course became increasingly short of breath with multifocal pneumonia on chest x-ray. Given recent hospitalization will treat for HCAP and broaden antibiotic coverage with empiric vancomycin and Zosyn. Check swallow eval given concern for aspiration. Continue O2 via nasal cannula and scheduled duonebs. Continue  oral prednisone.  Nonischemic cardiomyopathy with acute exacerbation. - euvolemic on admission but in volume overloaded overnight triggered by underlying pneumonia.. Continue Plavix, Coreg hydralazine, imdur and statin..  Switch to IV Lasix 40 mg twice a day. Continue Aldactone.  Monitor strict I/O and daily weight Not on ACEi/ ARB due to CKD Heart failure team consulted  Acute on chronic kidney disease stage III Baseline creatinine around 1.8-2. Torsemide and Aldactone held briefly on admission. [place] abd shows medical renal ds. Renal fn now at  baseline.     History of CVA and peripheral vascular disease Continue Plavix and statin.  Atrial fibrillation Rate controlled. Continue amiodarone and Coumadin. Dosing per pharmacy  GERD Continue PPI  DVT prophylaxis: On Coumadin Diet: Heart healthy  Code Status: DO NOT RESUSCITATE  Family Communication: discussed in detail with patient and his wife at bedside  Disposition Plan: Monitor on telemetry. Home once clinically improved possibly in 48-72 hrs   Consultants:  Heart failure  Procedures:  None  Antibiotics:  Z-Pak  HPI/Subjective: Seen and examined. He became increasingly short of breath and desaturated overnight. Chest x-ray showed new multifocal pneumonia and fluid overload.  Objective: Filed Vitals:   07/22/14 0949  BP: 103/45  Pulse: 86  Temp:   Resp:     Intake/Output Summary (Last 24 hours) at 07/22/14 1401 Last data filed at 07/22/14 1249  Gross per 24 hour  Intake   2040 ml  Output   1050 ml  Net    990 ml   Filed Weights   07/20/14 0417 07/21/14 0344 07/22/14 0658  Weight: 69 kg (152 lb 1.9 oz) 68.085 kg (150 lb 1.6 oz) 68.584 kg (151 lb 3.2 oz)    Exam:   General:   Elderly male in NAD  HEENT:  JVD+, supple neck, moist oral mucosa  Chest: Scattered rhonchi bilaterally, Left basilar crackles  CVS: S1 and S2 is irregularly irregular, no murmurs rub or gallop  GI: Soft, nondistended, nontender, BS+  Musculoskeletal: Warm, no edema  Cns: Alert and oriented     Data Reviewed: Basic Metabolic Panel:  Recent Labs Lab 07/20/14 0028 07/21/14 0500  NA 131* 131*  K 4.0 3.6  CL 92* 93*  CO2 25 25  GLUCOSE 206* 124*  BUN 70* 57*  CREATININE 2.65* 1.94*  CALCIUM 8.4 8.4  Liver Function Tests:  Recent Labs Lab 07/20/14 0028  AST 31  ALT 23  ALKPHOS 92  BILITOT 1.2  PROT 6.2  ALBUMIN 2.9*   No results for input(s): LIPASE, AMYLASE in the last 168 hours. No results for input(s): AMMONIA in the last 168  hours. CBC:  Recent Labs Lab 07/20/14 0028  WBC 12.2*  NEUTROABS 11.3*  HGB 10.2*  HCT 29.9*  MCV 94.9  PLT 242   Cardiac Enzymes: No results for input(s): CKTOTAL, CKMB, CKMBINDEX, TROPONINI in the last 168 hours. BNP (last 3 results)  Recent Labs  05/04/14 1700 07/20/14 0028  BNP 1013.0* 1469.3*    ProBNP (last 3 results)  Recent Labs  02/24/14 1606 02/26/14 1623 03/06/14 1129  PROBNP 2973.0* 18948.0* 5943.0*    CBG: No results for input(s): GLUCAP in the last 168 hours.  Recent Results (from the past 240 hour(s))  Culture, blood (routine x 2)     Status: None (Preliminary result)   Collection Time: 07/20/14  2:10 AM  Result Value Ref Range Status   Specimen Description BLOOD RIGHT FOREARM  Final   Special Requests BOTTLES DRAWN AEROBIC AND ANAEROBIC 5CC EA  Final   Culture   Final           BLOOD CULTURE RECEIVED NO GROWTH TO DATE CULTURE WILL BE HELD FOR 5 DAYS BEFORE ISSUING A FINAL NEGATIVE REPORT Performed at [facility]    Report Status PENDING  Incomplete  Culture, blood (routine x 2)     Status: None (Preliminary result)   Collection Time: 07/20/14  2:19 AM  Result Value Ref Range Status   Specimen Description BLOOD LEFT FOREARM  Final   Special Requests BOTTLES DRAWN AEROBIC ONLY 4CC  Final   Culture   Final           BLOOD CULTURE RECEIVED NO GROWTH TO DATE CULTURE WILL BE HELD FOR 5 DAYS BEFORE ISSUING A FINAL NEGATIVE REPORT Performed at [facility]    Report Status PENDING  Incomplete  MRSA PCR Screening     Status: None   Collection Time: 07/20/14  4:45 AM  Result Value Ref Range Status   MRSA by PCR NEGATIVE NEGATIVE Final    Comment:        The GeneXpert MRSA Assay (FDA approved for NASAL specimens only), is one component of a comprehensive MRSA colonization surveillance program. It is not intended to diagnose MRSA infection nor to guide or monitor treatment for MRSA infections.      Studies: Dg Chest Port  1 View  07/22/2014   CLINICAL DATA:  Acute onset of hypoxemia.  Initial encounter.  EXAM: PORTABLE CHEST - 1 VIEW  COMPARISON:  Chest radiograph performed 07/20/2014  FINDINGS: The lungs are well-aerated. Worsening bilateral midlung airspace opacification raises concern for multifocal pneumonia. Small bilateral pleural effusions are seen, and underlying interstitial edema cannot be excluded. Vascular congestion is noted. No pneumothorax is seen.  The cardiomediastinal silhouette is mildly enlarged. The patient is status post median sternotomy. A pacemaker is noted overlying the left chest wall, with leads ending overlying the right atrium and right ventricle. No acute osseous abnormalities are seen.  IMPRESSION: 1. Worsening bilateral mid lung airspace opacification raises concern for multifocal pneumonia. 2. Small bilateral pleural effusions, with vascular congestion and mild cardiomegaly. Underlying interstitial edema cannot be excluded.   Electronically Signed   By: Roanna Raider M.D.   On: 07/22/2014 04:30    Scheduled Meds: . allopurinol  200 mg Oral Daily  . amiodarone  200 mg Oral Daily  . atorvastatin  40 mg Oral QPM  . carvedilol  6.25 mg Oral BID WC  . ceFEPime (MAXIPIME) IV  1 g Intravenous Q24H  . clopidogrel  75 mg Oral Daily  . DULoxetine  60 mg Oral Daily  . furosemide  40 mg Intravenous Q12H  . hydrALAZINE  12.5 mg Oral BID  . ipratropium-albuterol  3 mL Nebulization Q6H  . isosorbide mononitrate  30 mg Oral Daily  . pantoprazole  80 mg Oral Daily  . predniSONE  40 mg Oral Q breakfast  . sodium chloride  3 mL Intravenous Q12H  . spironolactone  25 mg Oral Daily  . tamsulosin  0.4 mg Oral QHS  . vancomycin  1,000 mg Intravenous Q24H  . warfarin  1 mg Oral ONCE-1800  . Warfarin - Pharmacist Dosing Inpatient   Does not apply q1800   Continuous Infusions:     Time spent:35 minutes    Graceyn Fodor  [facility] Pager [phone]. If 7PM-7AM, please contact  night-coverage at [email or web address], password [facility] 07/22/2014, 2:01 PM  LOS: 2 days

## 2014-07-22 NOTE — Consult Note (Addendum)
Advanced Heart Failure Team Consult Note  Reason for Consultation: HF management  HPI:    Timothy Durham is an 81 yo male with a history of prior CVA in 2000 secondary to PAF, nonischemic cardimoyopathy s/p [employer] CRT-P (02/17/14), chronic systolic HF, non-obstructive CAD, HTN, CKD stage IV, HLD, PAD and bioprosthetic AVR.   Followed closely in HF clinic and recently doing quite well. Diuretics had to be cut back a bit due to hypotension. Weight has been stable around 154 pounds.   Admitted 2 days ago with worsening SOB with fevers and chills with WBC 12.2 and left shift. Weight stable. Initial CXR with mild vascular congestion. Felt to have acute bronchitis. However dyspnea got worse and CXR today shows worsening bilateral infiltrates ? HF vs multifocal PNA. Now on IV lasix and broad spectrub abx. Feeling better but still dyspneic. Urine output sluggish.    Review of Systems: [y] = yes, [ ]  = no   General: Weight gain [ ] ; Weight loss [ ] ; Anorexia [ ] ; Fatigue [ y]; Fever [ y]; Chills [name] ]; Weakness [ y]  Cardiac: Chest pain/pressure [ ] ; Resting SOB [name] ]; Exertional SOB [name] ]; Orthopnea [ ] ; Pedal Edema [ ] ; Palpitations [ ] ; Syncope [ ] ; Presyncope [ ] ; Paroxysmal nocturnal dyspnea[ ]   Pulmonary: Cough [ y]; Wheezing[ y]; Hemoptysis[ ] ; Sputum [name] ]; Snoring [ ]   GI: Vomiting[ ] ; Dysphagia[ ] ; Melena[ ] ; Hematochezia [ ] ; Heartburn[ ] ; Abdominal pain [ ] ; Constipation [ ] ; Diarrhea [ ] ; BRBPR [ ]   GU: Hematuria[ ] ; Dysuria [ ] ; Nocturia[ ]   Vascular: Pain in legs with walking [ ] ; Pain in feet with lying flat [ ] ; Non-healing sores [ ] ; Stroke [ ] ; TIA [ ] ; Slurred speech [ ] ;  Neuro: Headaches[ ] ; Vertigo[ ] ; Seizures[ ] ; Paresthesias[ ] ;Blurred vision [ ] ; Diplopia [ ] ; Vision changes [ ]   Ortho/Skin: Arthritis [name] ]; Joint pain [name] ]; Muscle pain [ ] ; Joint swelling [ ] ; Back Pain [ ] ; Rash [ ]   Psych: Depression[ ] ; Anxiety[ ]   Heme: Bleeding problems [ ] ; Clotting disorders [ ] ;  Anemia [ ]   Endocrine: Diabetes [ ] ; Thyroid dysfunction[ ]   Home Medications Prior to Admission medications   Medication Sig Start Date End Date Taking? Authorizing Provider  acetaminophen (TYLENOL) 500 MG tablet Take 1,000 mg by mouth at bedtime.    Yes Historical Provider, MD  allopurinol (ZYLOPRIM) 100 MG tablet Take 200 mg by mouth daily.   Yes Historical Provider, MD  amiodarone (PACERONE) 200 MG tablet Take 1 tablet (200 mg total) by mouth daily. 03/10/14  Yes Dolores Patty, MD  atorvastatin (LIPITOR) 40 MG tablet Take 40 mg by mouth every evening.   Yes Historical Provider, MD  Calcium Carb-Cholecalciferol (CALCIUM 600 + D PO) Take 2 tablets by mouth every morning.   Yes Historical Provider, MD  carvedilol (COREG) 6.25 MG tablet Take 1 tablet (6.25 mg total) by mouth 2 (two) times daily. 07/08/14  Yes Rollene Rotunda, MD  clopidogrel (PLAVIX) 75 MG tablet Take 75 mg by mouth daily.   Yes Historical Provider, MD  DULoxetine (CYMBALTA) 60 MG capsule Take 60 mg by mouth daily.  03/18/14  Yes Historical Provider, MD  hydrALAZINE (APRESOLINE) 25 MG tablet Take 0.5 tablets (12.5 mg total) by mouth 2 (two) times daily. 07/07/14  Yes Laurey Morale, MD  isosorbide mononitrate (IMDUR) 30 MG 24 hr tablet Take 1 tablet (30 mg total) by mouth daily. 05/06/14  Yes Amy D Clegg, NP  metolazone (ZAROXOLYN) 5 MG tablet Take 1 tablet (5 mg total) by mouth as needed. FOR WEIGHT 163 LB OR GREATER Patient taking differently: Take 5 mg by mouth as needed (fluid).  04/10/14  Yes Dolores Patty, MD  Omega-3 Fatty Acids (FISH OIL) 1200 MG CAPS Take 1 capsule by mouth every morning.   Yes Historical Provider, MD  omeprazole (PRILOSEC) 40 MG capsule Take 40 mg by mouth every morning.   Yes Historical Provider, MD  potassium chloride SA (K-DUR,KLOR-CON) 20 MEQ tablet Take 1 tablet (20 mEq total) by mouth as needed. TAKE ONLY WHEN YOU TAKE METOLAZONE 04/10/14  Yes Dolores Patty, MD  spironolactone  (ALDACTONE) 25 MG tablet Take 1 tablet (25 mg total) by mouth daily. 06/03/14  Yes Laurey Morale, MD  tamsulosin (FLOMAX) 0.4 MG CAPS capsule Take 1 capsule (0.4 mg total) by mouth at bedtime. 04/10/14  Yes Dolores Patty, MD  torsemide (DEMADEX) 20 MG tablet Take 1 tablet (20 mg total) by mouth 2 (two) times daily. Take 40 mg in am and 20 mg in pm 05/30/14  Yes Amy D Clegg, NP  warfarin (COUMADIN) 4 MG tablet Take 2 mg by mouth daily at 6 PM.  02/24/13  Yes Historical Provider, MD    Past Medical History: Past Medical History  Diagnosis Date  . Arthritis   . Non-obstructive CAD     a. 12/2013 NSTEMI/Cath: LM nl, LAD 20, LCX 40-50, RCA 20.  Marland Kitchen Hypertension   . Stroke     a. July 2000;  b. January 2007;  c. TIA in 2014.  . CKD (chronic kidney disease), stage III   . GERD (gastroesophageal reflux disease)   . Foot pain, bilateral   . H/O hematuria   . Hyperlipidemia   . Vitamin D deficiency   . Chronic combined systolic and diastolic CHF (congestive heart failure)     a. 12/2013 Echo: EF 30-35%, mod conc LVH, Gr 3 DD, mild AI, mod-sev MR, mod dil LA, mild TR.  Marland Kitchen PAF (paroxysmal atrial fibrillation)     a. CHA2DS2VASc = 7--> Chronic Coumadin.  Marland Kitchen NICM (nonischemic cardiomyopathy)     a. 12/2013 Echo: EF 30-35%.  . Aortic valve prosthesis present     a. 2006: S/P bioprosthetic AVR.  Marland Kitchen PAD (peripheral artery disease)     a. s/p LLE stenting.  . Carotid arterial disease     a. s/p R CEA  . History of tobacco abuse   . Moderate to Severe Mitral Regurgitation     a. 12/2013 Echo: Mod-Sev MR.  Marland Kitchen Anxiety   . COPD (chronic obstructive pulmonary disease)   . Coronary arteriosclerosis Oct 2015    med Rx  . Shortness of breath dyspnea   . Depression   . Presence of permanent cardiac pacemaker   . Heart murmur   . Dysrhythmia     Past Surgical History: Past Surgical History  Procedure Laterality Date  . Aortic valve replacement (avr)/coronary artery bypass grafting (cabg)  12/2008   . Eye surgery    . Carotid angiogram  1996  . Left lower extremity stent    . Carotid endarterectomy    . Insert / replace / remove pacemaker    . Coronary angiogram  Oct 2015  . Pacemaker insertion  Nov 2015    MDT BiV  . Left and right heart catheterization with coronary angiogram N/A 01/14/2014    Procedure: LEFT AND RIGHT HEART CATHETERIZATION  WITH CORONARY ANGIOGRAM;  Surgeon: Peter M Swaziland, MD;  Location: [facility] CATH LAB;  Service: Cardiovascular;  Laterality: N/A;  . Bi-ventricular pacemaker insertion N/A 02/17/2014    Procedure: BI-VENTRICULAR PACEMAKER INSERTION (CRT-P);  Surgeon: Marinus Maw, MD;  Location: [facility] CATH LAB;  Service: Cardiovascular;  Laterality: N/A;  . Coronary artery bypass graft    . Coronary angioplasty    . Tonsillectomy    . Cardiac valve replacement      Family History: Family History  Problem Relation Age of Onset  . Stroke Mother   . Cancer Father     Social History: History   Social History  . Marital Status: Married    Spouse Name: N/A  . Number of Children: N/A  . Years of Education: N/A   Social History Main Topics  . Smoking status: Former Smoker -- 2.00 packs/day for 35 years    Types: Cigarettes    Quit date: 09/26/1998  . Smokeless tobacco: Never Used  . Alcohol Use: 0.6 oz/week    1 Cans of beer per week     Comment: drinks one can of beer or wine daily.  . Drug Use: No  . Sexual Activity: Not on file   Other Topics Concern  . None   Social History Narrative    Allergies:  Allergies  Allergen Reactions  . Ambien [Zolpidem] Other (See Comments)    Hallucinations  . Ativan [Lorazepam] Other (See Comments)    hallucinations  . Oxycontin [Oxycodone] Other (See Comments)    hallucinations  . Penicillins     Unknown childhood reaction   . Sulfa Antibiotics     unknown    Objective:    Vital Signs:   Temp:  [97.4 F (36.3 C)-98 F (36.7 C)] 97.5 F (36.4 C) (04/26 1431) Pulse Rate:  [82-92] 82 (04/26  1431) Resp:  [20-21] 20 (04/26 0248) BP: (103-136)/(34-62) 113/34 mmHg (04/26 1431) SpO2:  [85 %-97 %] 96 % (04/26 1431) Weight:  [151 lb 3.2 oz (68.584 kg)] 151 lb 3.2 oz (68.584 kg) (04/26 0658) Last BM Date: 07/21/14  Weight change: Filed Weights   07/20/14 0417 07/21/14 0344 07/22/14 0658  Weight: 152 lb 1.9 oz (69 kg) 150 lb 1.6 oz (68.085 kg) 151 lb 3.2 oz (68.584 kg)    Intake/Output:   Intake/Output Summary (Last 24 hours) at 07/22/14 1821 Last data filed at 07/22/14 1400  Gross per 24 hour  Intake   1430 ml  Output   1050 ml  Net    380 ml     Physical Exam: General:  Elderly. Sitting in chair No resp difficulty HEENT: normal Neck: supple. JVP appears ~9.  +R CEA scar Carotids 2+ bilat; no bruits. No lymphadenopathy or thryomegaly appreciated. Cor: PMI nondisplaced. Regular rate & rhythm. No rubs, gallops or murmurs. Lungs: +basilar crackles Abdomen: soft, nontender, + distended. No hepatosplenomegaly. No bruits or masses. Good bowel sounds. Extremities: no cyanosis, clubbing, rash, 1+ edema Neuro: alert & orientedx3, cranial nerves grossly intact. moves all 4 extremities w/o difficulty. Affect pleasant  Telemetry: Sinus with v-pacing  Labs: Basic Metabolic Panel:  Recent Labs Lab 07/20/14 0028 07/21/14 0500  NA 131* 131*  K 4.0 3.6  CL 92* 93*  CO2 25 25  GLUCOSE 206* 124*  BUN 70* 57*  CREATININE 2.65* 1.94*  CALCIUM 8.4 8.4    Liver Function Tests:  Recent Labs Lab 07/20/14 0028  AST 31  ALT 23  ALKPHOS 92  BILITOT 1.2  PROT  6.2  ALBUMIN 2.9*   No results for input(s): LIPASE, AMYLASE in the last 168 hours. No results for input(s): AMMONIA in the last 168 hours.  CBC:  Recent Labs Lab 07/20/14 0028  WBC 12.2*  NEUTROABS 11.3*  HGB 10.2*  HCT 29.9*  MCV 94.9  PLT 242    Cardiac Enzymes: No results for input(s): CKTOTAL, CKMB, CKMBINDEX, TROPONINI in the last 168 hours.  BNP: BNP (last 3 results)  Recent Labs   05/04/14 1700 07/20/14 0028  BNP 1013.0* 1469.3*    ProBNP (last 3 results)  Recent Labs  02/24/14 1606 02/26/14 1623 03/06/14 1129  PROBNP 2973.0* 18948.0* 5943.0*     CBG: No results for input(s): GLUCAP in the last 168 hours.  Coagulation Studies:  Recent Labs  07/20/14 0028 07/20/14 0526 07/21/14 0500 07/22/14 0600  LABPROT 31.3* 33.1* 28.7* 24.9*  INR 2.99* 3.21* 2.68* 2.24*    Other results: EKG: SR with v pacing 77  Imaging: Dg Chest Port 1 View  07/22/2014   CLINICAL DATA:  Acute onset of hypoxemia.  Initial encounter.  EXAM: PORTABLE CHEST - 1 VIEW  COMPARISON:  Chest radiograph performed 07/20/2014  FINDINGS: The lungs are well-aerated. Worsening bilateral midlung airspace opacification raises concern for multifocal pneumonia. Small bilateral pleural effusions are seen, and underlying interstitial edema cannot be excluded. Vascular congestion is noted. No pneumothorax is seen.  The cardiomediastinal silhouette is mildly enlarged. The patient is status post median sternotomy. A pacemaker is noted overlying the left chest wall, with leads ending overlying the right atrium and right ventricle. No acute osseous abnormalities are seen.  IMPRESSION: 1. Worsening bilateral mid lung airspace opacification raises concern for multifocal pneumonia. 2. Small bilateral pleural effusions, with vascular congestion and mild cardiomegaly. Underlying interstitial edema cannot be excluded.   Electronically Signed   By: Roanna Raider M.D.   On: 07/22/2014 04:30      Medications:     Current Medications: . allopurinol  200 mg Oral Daily  . amiodarone  200 mg Oral Daily  . atorvastatin  40 mg Oral QPM  . carvedilol  6.25 mg Oral BID WC  . ceFEPime (MAXIPIME) IV  1 g Intravenous Q24H  . clopidogrel  75 mg Oral Daily  . DULoxetine  60 mg Oral Daily  . furosemide  40 mg Intravenous Q12H  . hydrALAZINE  12.5 mg Oral BID  . ipratropium-albuterol  3 mL Nebulization Q6H  .  isosorbide mononitrate  30 mg Oral Daily  . pantoprazole  80 mg Oral Daily  . predniSONE  40 mg Oral Q breakfast  . sodium chloride  3 mL Intravenous Q12H  . spironolactone  25 mg Oral Daily  . tamsulosin  0.4 mg Oral QHS  . vancomycin  1,000 mg Intravenous Q24H  . Warfarin - Pharmacist Dosing Inpatient   Does not apply q1800     Infusions:      Assessment:   1. Acute respiratory failure 2. Acute on chronic systolic heart failure EF 30-35% 3. Acute bronchitis 4. CKD, stage III-IV 5. PAF in NSR on amio - CHDSVASC 5. On warfarin  Plan/Discussion:     Initial presentation seems more infectious than HF. But now appears to be volume overloaded. Not responding well to lasix 40 iv bid. Will increase to 80 IV bid. Will interrogate pacer and assess impedance in am. Agree with broad spectrum abx. HF team ill follow. Continue amio and warfarin for AF. Watch renal function closely.     Length  of Stay: 2  Arvilla Meres  MD  07/22/2014, 6:21 PM  Advanced Heart Failure Team Pager [phone] (M-F; 7a - 4p)  Please contact [facility] for night-coverage after hours (4p -7a ) and weekends on [email or web address]

## 2014-07-23 DIAGNOSIS — R0902 Hypoxemia: Secondary | ICD-10-CM | POA: Insufficient documentation

## 2014-07-23 DIAGNOSIS — I482 Chronic atrial fibrillation: Secondary | ICD-10-CM

## 2014-07-23 DIAGNOSIS — J9600 Acute respiratory failure, unspecified whether with hypoxia or hypercapnia: Secondary | ICD-10-CM

## 2014-07-23 LAB — BASIC METABOLIC PANEL
Anion gap: 13 (ref 5–15)
BUN: 62 mg/dL — AB (ref 6–23)
CHLORIDE: 93 mmol/L — AB (ref 96–112)
CO2: 26 mmol/L (ref 19–32)
Calcium: 8.5 mg/dL (ref 8.4–10.5)
Creatinine, Ser: 1.86 mg/dL — ABNORMAL HIGH (ref 0.50–1.35)
GFR calc Af Amer: 37 mL/min — ABNORMAL LOW (ref >90)
GFR calc non Af Amer: 32 mL/min — ABNORMAL LOW (ref >90)
Glucose, Bld: 113 mg/dL — ABNORMAL HIGH (ref 70–99)
Potassium: 4 mmol/L (ref 3.5–5.1)
Sodium: 132 mmol/L — ABNORMAL LOW (ref 135–145)

## 2014-07-23 LAB — PROTIME-INR
INR: 2.16 — AB (ref 0.00–1.49)
Prothrombin Time: 24.3 seconds — ABNORMAL HIGH (ref 11.6–15.2)

## 2014-07-23 MED ORDER — WARFARIN SODIUM 2 MG PO TABS
2.0000 mg | ORAL_TABLET | Freq: Once | ORAL | Status: AC
  Administered 2014-07-23: 2 mg via ORAL
  Filled 2014-07-23: qty 1

## 2014-07-23 MED ORDER — POTASSIUM CHLORIDE CRYS ER 20 MEQ PO TBCR
40.0000 meq | EXTENDED_RELEASE_TABLET | Freq: Once | ORAL | Status: AC
  Administered 2014-07-23: 40 meq via ORAL
  Filled 2014-07-23: qty 2

## 2014-07-23 MED ORDER — METOLAZONE 2.5 MG PO TABS
2.5000 mg | ORAL_TABLET | Freq: Once | ORAL | Status: AC
  Administered 2014-07-23: 2.5 mg via ORAL
  Filled 2014-07-23: qty 1

## 2014-07-23 NOTE — Progress Notes (Signed)
ANTICOAGULATION CONSULT NOTE  Pharmacy Consult for Coumadin Indication: atrial fibrillation  Allergies  Allergen Reactions  . Ambien [Zolpidem] Other (See Comments)    Hallucinations  . Ativan [Lorazepam] Other (See Comments)    hallucinations  . Oxycontin [Oxycodone] Other (See Comments)    hallucinations  . Penicillins     Unknown childhood reaction   . Sulfa Antibiotics     unknown    Patient Measurements: Height: 5\' 10"  (177.8 cm) Weight: 151 lb (68.493 kg) (scale b) IBW/kg (Calculated) : 73  Vital Signs: Temp: 98 F (36.7 C) (04/27 0544) Temp Source: Oral (04/27 0544) BP: 120/40 mmHg (04/27 1022) Pulse Rate: 85 (04/27 1022)  Labs:  Recent Labs  07/21/14 0500 07/22/14 0600 07/23/14 0455  LABPROT 28.7* 24.9* 24.3*  INR 2.68* 2.24* 2.16*  CREATININE 1.94*  --  1.86*    Estimated Creatinine Clearance: 30.2 mL/min (by C-G formula based on Cr of 1.86).  Assessment: 81yo male to continue on pta coumadin for PAF (last pta dose 4/23). INR 2.99 on admit Also on amio po pta. Warfarin held on 4/24.  INR down to 2.1 this morning. No new CBC- baseline hgb 10.2, plts 242- no bleeding noted.  PTA warfarin dose: 2mg  daily  Goal of Therapy:  INR 2-3   Plan:  -warfarin 2mg  -Monitor daily PT/INR  -follow for s/sx bleeding  Sheppard Coil PharmD., BCPS Clinical Pharmacist Pager [phone] 07/23/2014 2:02 PM

## 2014-07-23 NOTE — Evaluation (Signed)
Physical Therapy Evaluation Patient Details Name: Timothy Durham MRN: [id number] DOB: 16-Apr-1930 Today's Date: 07/23/2014   History of Present Illness  81 y.o. male admitted with Acute hypoxic respiratory failure, and Nonischemic cardiomyopathy with acute exacerbation.  Clinical Impression  Pt admitted with above complications. Pt currently with functional limitations due to the deficits listed below (see PT Problem List). Ambulates with a rolling walker for stability up to 250 feet today on 3L supplemental O2. SpO2 down to 86% briefly, 96% on 3L supplemental O2 at rest. States he will have 24 hour supervision from wife as needed at d/c. Pt will benefit from skilled PT to increase their independence and safety with mobility to allow discharge to the venue listed below.       Follow Up Recommendations Home health PT;Supervision/Assistance - 24 hour    Equipment Recommendations  Rolling walker with 5" wheels (may need tub bench vs 3-in-1 - TBD)    Recommendations for Other Services OT consult     Precautions / Restrictions Precautions Precautions: Fall Restrictions Weight Bearing Restrictions: No      Mobility  Bed Mobility               General bed mobility comments: sitting in chair  Transfers Overall transfer level: Needs assistance Equipment used: None Transfers: Sit to/from Stand Sit to Stand: Min guard         General transfer comment: Min guard for safety. Demonstrates minor sway but able to self correct.  Ambulation/Gait Ambulation/Gait assistance: Min guard Ambulation Distance (Feet): 250 Feet Assistive device: Rolling walker (2 wheeled) Gait Pattern/deviations: Step-through pattern;Decreased stride length;Decreased dorsiflexion - right;Leaning posteriorly Gait velocity: decreased   General Gait Details: Educated on safe DME use with a rolling walker. Pt attempted to take several steps without assistive device however staggers slightly to posterior.  Improves stability greatly with use of RW for UE support. VC for Rt foot clearance which only drags intermittently. SpO2 dropped to 86% on 3L supplemental O2 during bout. No buckling noted or assistance needed to correct balance.  Stairs            Wheelchair Mobility    Modified Rankin (Stroke Patients Only)       Balance Overall balance assessment: Needs assistance;History of Falls Sitting-balance support: No upper extremity supported;Feet supported Sitting balance-Leahy Scale: Good     Standing balance support: No upper extremity supported Standing balance-Leahy Scale: Fair                               Pertinent Vitals/Pain Pain Assessment: No/denies pain    Home Living Family/patient expects to be discharged to:: Private residence Living Arrangements: Spouse/significant other Available Help at Discharge: Family;Available 24 hours/day Type of Home: House Home Access: Stairs to enter Entrance Stairs-Rails: None Entrance Stairs-Number of Steps: 1 step to patio, one step to house Home Layout: One level Home Equipment: None      Prior Function Level of Independence: Independent               Hand Dominance   Dominant Hand: Left    Extremity/Trunk Assessment   Upper Extremity Assessment: Defer to OT evaluation           Lower Extremity Assessment: RLE deficits/detail RLE Deficits / Details: Rt ankle dorsiflexion 2/5. Reports this is an old symptom. Decreased light touch sensation around Rt distal LE.       Communication   Communication: No difficulties  Cognition Arousal/Alertness: Awake/alert Behavior During Therapy: WFL for tasks assessed/performed Overall Cognitive Status: Within Functional Limits for tasks assessed                      General Comments General comments (skin integrity, edema, etc.): SpO2 96% on 3L supplemental O2, 86% on 3L after ambulating.    Exercises General Exercises - Lower Extremity Ankle  Circles/Pumps: AROM;Both;10 reps;Seated Long Arc Quad: Strengthening;Both;10 reps;Seated Hip Flexion/Marching: Strengthening;Both;10 reps;Seated (causes increased hip pain - instructed to d/c this exercise)      Assessment/Plan    PT Assessment Patient needs continued PT services  PT Diagnosis Difficulty walking;Abnormality of gait;Generalized weakness   PT Problem List Decreased strength;Decreased range of motion;Decreased activity tolerance;Decreased balance;Decreased mobility;Decreased knowledge of use of DME;Cardiopulmonary status limiting activity  PT Treatment Interventions DME instruction;Gait training;Functional mobility training;Therapeutic activities;Therapeutic exercise;Balance training;Neuromuscular re-education;Patient/family education;Modalities   PT Goals (Current goals can be found in the Care Plan section) Acute Rehab PT Goals Patient Stated Goal: Go home PT Goal Formulation: With patient Time For Goal Achievement: 08/06/14 Potential to Achieve Goals: Good    Frequency Min 3X/week   Barriers to discharge        Co-evaluation               End of Session Equipment Utilized During Treatment: Gait belt;Oxygen (3L) Activity Tolerance: Patient tolerated treatment well Patient left: in chair;with call bell/phone within reach Nurse Communication: Mobility status;Other (comment) (O2 Sats)         Time: 1914-7829 PT Time Calculation (min) (ACUTE ONLY): 29 min   Charges:   PT Evaluation $Initial PT Evaluation Tier I: 1 Procedure PT Treatments $Gait Training: 8-22 mins   PT G Codes:        Berton Mount 07/23/2014, 7:09 PM  [facility], [place] [phone]

## 2014-07-23 NOTE — Evaluation (Signed)
Clinical/Bedside Swallow Evaluation Patient Details  Name: Timothy Durham MRN: [id number] Date of Birth: 1930/10/10  Today's Date: 07/23/2014 Time: SLP Start Time (ACUTE ONLY): 1428 SLP Stop Time (ACUTE ONLY): 1449 SLP Time Calculation (min) (ACUTE ONLY): 21 min  Past Medical History:  Past Medical History  Diagnosis Date  . Arthritis   . Non-obstructive CAD     a. 12/2013 NSTEMI/Cath: LM nl, LAD 20, LCX 40-50, RCA 20.  Marland Kitchen Hypertension   . Stroke     a. July 2000;  b. January 2007;  c. TIA in 2014.  . CKD (chronic kidney disease), stage III   . GERD (gastroesophageal reflux disease)   . Foot pain, bilateral   . H/O hematuria   . Hyperlipidemia   . Vitamin D deficiency   . Chronic combined systolic and diastolic CHF (congestive heart failure)     a. 12/2013 Echo: EF 30-35%, mod conc LVH, Gr 3 DD, mild AI, mod-sev MR, mod dil LA, mild TR.  Marland Kitchen PAF (paroxysmal atrial fibrillation)     a. CHA2DS2VASc = 7--> Chronic Coumadin.  Marland Kitchen NICM (nonischemic cardiomyopathy)     a. 12/2013 Echo: EF 30-35%.  . Aortic valve prosthesis present     a. 2006: S/P bioprosthetic AVR.  Marland Kitchen PAD (peripheral artery disease)     a. s/p LLE stenting.  . Carotid arterial disease     a. s/p R CEA  . History of tobacco abuse   . Moderate to Severe Mitral Regurgitation     a. 12/2013 Echo: Mod-Sev MR.  Marland Kitchen Anxiety   . COPD (chronic obstructive pulmonary disease)   . Coronary arteriosclerosis Oct 2015    med Rx  . Shortness of breath dyspnea   . Depression   . Presence of permanent cardiac pacemaker   . Heart murmur   . Dysrhythmia    Past Surgical History:  Past Surgical History  Procedure Laterality Date  . Aortic valve replacement (avr)/coronary artery bypass grafting (cabg)  12/2008  . Eye surgery    . Carotid angiogram  1996  . Left lower extremity stent    . Carotid endarterectomy    . Insert / replace / remove pacemaker    . Coronary angiogram  Oct 2015  . Pacemaker insertion  Nov 2015    MDT BiV   . Left and right heart catheterization with coronary angiogram N/A 01/14/2014    Procedure: LEFT AND RIGHT HEART CATHETERIZATION WITH CORONARY ANGIOGRAM;  Surgeon: Timothy M Swaziland, MD;  Location: [facility] CATH LAB;  Service: Cardiovascular;  Laterality: N/A;  . Bi-ventricular pacemaker insertion N/A 02/17/2014    Procedure: BI-VENTRICULAR PACEMAKER INSERTION (CRT-P);  Surgeon: Timothy Maw, MD;  Location[facility] CATH LAB;  Service: Cardiovascular;  Laterality: N/A;  . Coronary artery bypass graft    . Coronary angioplasty    . Tonsillectomy    . Cardiac valve replacement     HPI:  81 year old male with history of nonischemic cardiomyopathy, NST EMI in 2015, chronic kidney disease stage III, stroke in 2000, history of bioprosthetic aVR, paroxysmal A. fib on chronic Coumadin, tobacco use with? COPD who presented with progressive shortness of breath for the past 3 days associated with nonproductive cough and wheezing.  Dx acute hypoxic respiratory failure/pna, swallow eval ordered.  Per pt and wife, pt had dysphagia after CVA in 2010, for which he received therapy x 6 [place] ([place]  He has occasional difficulty with swallowing, but for the most part it is functional.    Assessment /  Plan / Recommendation Clinical Impression  Pt presents with functional swallowing, with adequate mastication, swift swallow trigger, and one incident of coughing when consuming thin liquids in conjunction with cracker.  Due to his hx of a neurogenic dysphagia, we discussed predisposition for swallowing to regress when he has an acute medical issue.  Pt with good awareness; we reviewed standard precautions to minimize aspiration risk.  Pt demonstrated and verbalized understanding.  Recommend continuing current diet - regular, thin liquids, meds one at a time with water or puree - no further SLP intervention warranted.  Will sign off.     Aspiration Risk  Mild    Diet Recommendation Regular;Thin liquid   Liquid Administration  via: Cup Medication Administration: Whole meds with liquid Compensations: Slow rate;Small sips/bites Postural Changes and/or Swallow Maneuvers: Seated upright 90 degrees;Upright 30-60 min after meal    Other  Recommendations Oral Care Recommendations: Oral care BID   Follow Up Recommendations  None        Swallow Study Prior Functional Status       General HPI: 81 year old male with history of nonischemic cardiomyopathy, NST EMI in 2015, chronic kidney disease stage III, stroke in 2000, history of bioprosthetic aVR, paroxysmal A. fib on chronic Coumadin, tobacco use with? COPD who presented with progressive shortness of breath for the past 3 days associated with nonproductive cough and wheezing.  Dx acute hypoxic respiratory failure/pna, swallow eval ordered.  Per pt and wife, pt had dysphagia after CVA in 2010, for which he received therapy x 6 months ([place]  He has occasional difficulty with swallowing, but for the most part it is functional.  Type of Study: Bedside swallow evaluation Previous Swallow Assessment: 2010 in [place] Diet Prior to this Study: Regular;Thin liquids Temperature Spikes Noted: No Respiratory Status: Nasal cannula History of Recent Intubation: No Behavior/Cognition: Alert;Cooperative;Pleasant mood Oral Cavity - Dentition: Adequate natural dentition Self-Feeding Abilities: Able to feed self Patient Positioning: Upright in chair Baseline Vocal Quality: Clear Volitional Cough: Strong Volitional Swallow: Able to elicit    Oral/Motor/Sensory Function Overall Oral Motor/Sensory Function: Appears within functional limits for tasks assessed   Ice Chips Ice chips: Within functional limits Presentation: Self Fed;Spoon   Thin Liquid Thin Liquid: Within functional limits Presentation: Cup    Nectar Thick Nectar Thick Liquid: Not tested   Honey Thick Honey Thick Liquid: Not tested   Puree Puree: Within functional limits Presentation: Self Fed    Solid   GO    Solid: Within functional limits Presentation: Self Fed      Timothy Vineyard L. Samson Frederic, [place] CCC/SLP Pager [phone]  Blenda Mounts Laurice 07/23/2014,2:59 PM

## 2014-07-23 NOTE — Progress Notes (Signed)
[facility] PROGRESS NOTE  Timothy Durham:[id number] DOB: 1930/03/30 DOA: 07/19/2014 PCP: Dennis Bast, MD   brief narrative 81 year old male with history of nonischemic cardiomyopathy with EF of 30-35% as per last echo, NST EMI in 2015 with cardiac cath showing nonobstructive CAD, chronic kidney disease stage III, history of stroke in 2000, history of bioprosthetic aVR, paroxysmal A. fib on chronic Coumadin, tobacco use with? COPD who presented with progressive shortness of breath for the past 3 days associated with nonproductive cough and wheezing. She reports being compliant with these medications. Chest x-ray on admission showing moderate vascular congestion. Labs showed elevated BNP of 1400. Patient admitted for acute hypoxic respiratory failure due to acute bronchitis. During the hospital stay he became increasingly short of breath and wheezy with follow-up chest x-ray showing multifocal pneumonia and volume overload.   Assessment/Plan: Acute hypoxic respiratory failure Initially admitted for acute bronchitis but during the hospital course became increasingly short of breath with multifocal pneumonia on chest x-ray. Given recent hospitalization will treat for HCAP and broaden antibiotic coverage with empiric vancomycin and Zosyn. passed swallow eval on 4/27 with regular diet and thin liquid. Continue O2 via nasal cannula and scheduled duonebs. Continue  oral prednisone. Taper as able  Nonischemic cardiomyopathy with acute exacerbation. - euvolemic on admission but in volume overloaded overnight triggered by underlying pneumonia.. Continue Plavix, Coreg hydralazine, imdur and statin..  Switch to IV Lasix . Continue Aldactone.  Monitor strict I/O and daily weight Not on ACEi/ ARB due to CKD Heart failure team consulted  Acute on chronic kidney disease stage III Baseline creatinine around 1.8-2. Torsemide and Aldactone held briefly on admission. [place] abd shows medical renal ds. Renal fn  now at baseline.    History of CVA and peripheral vascular disease Continue Plavix and statin.  Atrial fibrillation Pace rhythm, Rate controlled. Continue amiodarone and Coumadin. Dosing per pharmacy  GERD Continue PPI  DVT prophylaxis: On Coumadin Diet: Heart healthy  Code Status: DO NOT RESUSCITATE  Family Communication: discussed in detail with patient   Disposition Plan: Monitor on telemetry. Home once clinically improved possibly in 48-72 hrs   Consultants:  Heart failure  Procedures:  None  Antibiotics:  Z-Pak  Vanc/cefepime from 4/24  HPI/Subjective:  Reported last night coughed up a lot of thick pinkish sputum, currently feeling better, sitting in chair having dinner, denies chest pain, no sob on nasal cannula, does has intermittent dry cough during exam.    Objective: Filed Vitals:   07/23/14 1500  BP: 115/45  Pulse: 80  Temp: 97.6 F (36.4 C)  Resp:     Intake/Output Summary (Last 24 hours) at 07/23/14 1744 Last data filed at 07/23/14 1500  Gross per 24 hour  Intake   1290 ml  Output    400 ml  Net    890 ml   Filed Weights   07/21/14 0344 07/22/14 0658 07/23/14 0544  Weight: 68.085 kg (150 lb 1.6 oz) 68.584 kg (151 lb 3.2 oz) 68.493 kg (151 lb)    Exam:   General:   Elderly male in NAD  HEENT:  JVD+, supple neck, moist oral mucosa  Chest: Scattered rhonchi bilaterally, Left basilar crackles  CVS: S1 and S2 is irregularly irregular, no murmurs rub or gallop  GI: Soft, nondistended, nontender, BS+  Musculoskeletal: Warm, no edema  Cns: Alert and oriented     Data Reviewed: Basic Metabolic Panel:  Recent Labs Lab 07/20/14 0028 07/21/14 0500 07/23/14 0455  NA 131* 131* 132*  K 4.0  3.6 4.0  CL 92* 93* 93*  CO2 GLUCOSE 206* 124* 113*  BUN 70* 57* 62*  CREATININE 2.65* 1.94* 1.86*  CALCIUM 8.4 8.4 8.5   Liver Function Tests:  Recent Labs Lab 07/20/14 0028  AST 31  ALT 23  ALKPHOS 92  BILITOT 1.2   PROT 6.2  ALBUMIN 2.9*   No results for input(s): LIPASE, AMYLASE in the last 168 hours. No results for input(s): AMMONIA in the last 168 hours. CBC:  Recent Labs Lab 07/20/14 0028  WBC 12.2*  NEUTROABS 11.3*  HGB 10.2*  HCT 29.9*  MCV 94.9  PLT 242   Cardiac Enzymes: No results for input(s): CKTOTAL, CKMB, CKMBINDEX, TROPONINI in the last 168 hours. BNP (last 3 results)  Recent Labs  05/04/14 1700 07/20/14 0028  BNP 1013.0* 1469.3*    ProBNP (last 3 results)  Recent Labs  02/24/14 1606 02/26/14 1623 03/06/14 1129  PROBNP 2973.0* 18948.0* 5943.0*    CBG: No results for input(s): GLUCAP in the last 168 hours.  Recent Results (from the past 240 hour(s))  Culture, blood (routine x 2)     Status: None (Preliminary result)   Collection Time: 07/20/14  2:10 AM  Result Value Ref Range Status   Specimen Description BLOOD RIGHT FOREARM  Final   Special Requests BOTTLES DRAWN AEROBIC AND ANAEROBIC 5CC EA  Final   Culture   Final           BLOOD CULTURE RECEIVED NO GROWTH TO DATE CULTURE WILL BE HELD FOR 5 DAYS BEFORE ISSUING A FINAL NEGATIVE REPORT Performed at [facility]    Report Status PENDING  Incomplete  Culture, blood (routine x 2)     Status: None (Preliminary result)   Collection Time: 07/20/14  2:19 AM  Result Value Ref Range Status   Specimen Description BLOOD LEFT FOREARM  Final   Special Requests BOTTLES DRAWN AEROBIC ONLY 4CC  Final   Culture   Final           BLOOD CULTURE RECEIVED NO GROWTH TO DATE CULTURE WILL BE HELD FOR 5 DAYS BEFORE ISSUING A FINAL NEGATIVE REPORT Performed at [facility]    Report Status PENDING  Incomplete  MRSA PCR Screening     Status: None   Collection Time: 07/20/14  4:45 AM  Result Value Ref Range Status   MRSA by PCR NEGATIVE NEGATIVE Final    Comment:        The GeneXpert MRSA Assay (FDA approved for NASAL specimens only), is one component of a comprehensive MRSA colonization surveillance  program. It is not intended to diagnose MRSA infection nor to guide or monitor treatment for MRSA infections.      Studies: Dg Chest Port 1 View  07/22/2014   CLINICAL DATA:  Acute onset of hypoxemia.  Initial encounter.  EXAM: PORTABLE CHEST - 1 VIEW  COMPARISON:  Chest radiograph performed 07/20/2014  FINDINGS: The lungs are well-aerated. Worsening bilateral midlung airspace opacification raises concern for multifocal pneumonia. Small bilateral pleural effusions are seen, and underlying interstitial edema cannot be excluded. Vascular congestion is noted. No pneumothorax is seen.  The cardiomediastinal silhouette is mildly enlarged. The patient is status post median sternotomy. A pacemaker is noted overlying the left chest wall, with leads ending overlying the right atrium and right ventricle. No acute osseous abnormalities are seen.  IMPRESSION: 1. Worsening bilateral mid lung airspace opacification raises concern for multifocal pneumonia. 2. Small bilateral pleural effusions, with vascular  congestion and mild cardiomegaly. Underlying interstitial edema cannot be excluded.   Electronically Signed   By: Roanna Raider M.D.   On: 07/22/2014 04:30    Scheduled Meds: . allopurinol  200 mg Oral Daily  . amiodarone  200 mg Oral Daily  . atorvastatin  40 mg Oral QPM  . carvedilol  6.25 mg Oral BID WC  . ceFEPime (MAXIPIME) IV  1 g Intravenous Q24H  . clopidogrel  75 mg Oral Daily  . DULoxetine  60 mg Oral Daily  . hydrALAZINE  12.5 mg Oral BID  . ipratropium-albuterol  3 mL Nebulization Q6H  . isosorbide mononitrate  30 mg Oral Daily  . pantoprazole  80 mg Oral Daily  . predniSONE  40 mg Oral Q breakfast  . sodium chloride  3 mL Intravenous Q12H  . spironolactone  25 mg Oral Daily  . tamsulosin  0.4 mg Oral QHS  . vancomycin  1,000 mg Intravenous Q24H  . warfarin  2 mg Oral ONCE-1800  . Warfarin - Pharmacist Dosing Inpatient   Does not apply q1800   Continuous Infusions:     Time  spent:35 minutes    Terris Germano  [facility] Pager [phone]. If 7PM-7AM, please contact night-coverage at [email or web address], password [facility] 07/23/2014, 5:44 PM  LOS: 3 days

## 2014-07-23 NOTE — Progress Notes (Addendum)
Patient ID: Timothy Durham, male   DOB: 06-Dec-1930, 81 y.o.   MRN: [id number]   Mr. Laughman is an 81 yo male with a history of prior CVA in 2000 secondary to PAF, nonischemic cardimoyopathy s/p [employer] CRT-P (02/17/14), chronic systolic HF (EF 30-35%), non-obstructive CAD, HTN, CKD stage IV, HLD, PAD and bioprosthetic AVR.   Followed closely in HF clinic and recently doing quite well. Diuretics had to be cut back a bit due to hypotension. Weight has been stable around 154 pounds.   Admitted 4/24 ago with worsening SOB with fevers and chills with WBC 12.2 and left shift. Weight stable. Initial CXR with mild vascular congestion. Felt to have acute bronchitis. However dyspnea got worse and CXR 4/26 showed worsening bilateral infiltrates ? HF vs multifocal PNA. Now on IV lasix and broad spectrub abx.  Patient reports less dyspnea but UOP still sluggish after 2 doses of Lasix 80 mg IV.  Creatinine remains stable.   Scheduled Meds: . allopurinol  200 mg Oral Daily  . amiodarone  200 mg Oral Daily  . atorvastatin  40 mg Oral QPM  . carvedilol  6.25 mg Oral BID WC  . ceFEPime (MAXIPIME) IV  1 g Intravenous Q24H  . clopidogrel  75 mg Oral Daily  . DULoxetine  60 mg Oral Daily  . furosemide  80 mg Intravenous Q12H  . hydrALAZINE  12.5 mg Oral BID  . ipratropium-albuterol  3 mL Nebulization Q6H  . isosorbide mononitrate  30 mg Oral Daily  . metolazone  2.5 mg Oral Once  . pantoprazole  80 mg Oral Daily  . potassium chloride  40 mEq Oral Once  . predniSONE  40 mg Oral Q breakfast  . sodium chloride  3 mL Intravenous Q12H  . spironolactone  25 mg Oral Daily  . tamsulosin  0.4 mg Oral QHS  . vancomycin  1,000 mg Intravenous Q24H  . Warfarin - Pharmacist Dosing Inpatient   Does not apply q1800   Continuous Infusions:  PRN Meds:.acetaminophen, albuterol   Filed Vitals:   07/22/14 1946 07/22/14 2124 07/23/14 0256 07/23/14 0544  BP: 123/52   113/42  Pulse: 84   77  Temp: 97.8 F (36.6 C)   98 F  (36.7 C)  TempSrc: Oral   Oral  Resp: 20   20  Height:      Weight:    151 lb (68.493 kg)  SpO2: 95% 90% 95% 95%    Intake/Output Summary (Last 24 hours) at 07/23/14 0940 Last data filed at 07/23/14 0600  Gross per 24 hour  Intake    850 ml  Output    650 ml  Net    200 ml    LABS: Basic Metabolic Panel:  Recent Labs  32/35/57 0500 07/23/14 0455  NA 131* 132*  K 3.6 4.0  CL 93* 93*  CO2 25 26  GLUCOSE 124* 113*  BUN 57* 62*  CREATININE 1.94* 1.86*  CALCIUM 8.4 8.5   Liver Function Tests: No results for input(s): AST, ALT, ALKPHOS, BILITOT, PROT, ALBUMIN in the last 72 hours. No results for input(s): LIPASE, AMYLASE in the last 72 hours. CBC: No results for input(s): WBC, NEUTROABS, HGB, HCT, MCV, PLT in the last 72 hours. Cardiac Enzymes: No results for input(s): CKTOTAL, CKMB, CKMBINDEX, TROPONINI in the last 72 hours. BNP: Invalid input(s): POCBNP D-Dimer: No results for input(s): DDIMER in the last 72 hours. Hemoglobin A1C: No results for input(s): HGBA1C in the last 72 hours. Fasting Lipid Panel:  No results for input(s): CHOL, HDL, LDLCALC, TRIG, CHOLHDL, LDLDIRECT in the last 72 hours. Thyroid Function Tests: No results for input(s): TSH, T4TOTAL, T3FREE, THYROIDAB in the last 72 hours.  Invalid input(s): FREET3 Anemia Panel: No results for input(s): VITAMINB12, FOLATE, FERRITIN, TIBC, IRON, RETICCTPCT in the last 72 hours.  RADIOLOGY: Dg Chest 2 View  07/20/2014   CLINICAL DATA:  Shortness of breath.  Pacemaker.  EXAM: CHEST  2 VIEW  COMPARISON:  05/04/2014.  FINDINGS: Mildly increased cardiac silhouette. Triple lead pacer unchanged from priors. Moderate vascular congestion without overt failure or significant consolidation. Mild to moderate pleural effusions, greater on the RIGHT. Aortic valvular placement with prior median sternotomy. No acute osseous findings.  IMPRESSION: Cardiomegaly with stable appearance of the transvenous pacer.  Moderate  vascular congestion without overt failure. Overall improved from prior radiograph in February.   Electronically Signed   By: Davonna Belling M.D.   On: 07/20/2014 02:23   US Renal  07/20/2014   CLINICAL DATA:  Acute kidney injury.  Hypertension.  EXAM: RENAL/URINARY TRACT ULTRASOUND COMPLETE  COMPARISON:  None.  FINDINGS: Right Kidney:  Length: 10.3 cm. Echogenicity within normal limits. No mass or hydronephrosis visualized. Diffuse cortical thinning with prominent renal sinus fat.  Left Kidney:  Length: 11.1 cm. Echogenicity within normal limits. No mass or hydronephrosis visualized. Diffuse cortical thinning with prominent renal sinus fat.  Bladder:  Multiple diverticula. The largest measures 4.9 cm in maximum diameter. Small amount of internal debris with no definite calculi  IMPRESSION: 1. Diffuse bilateral renal cortical atrophy. 2. Multiple bladder diverticula. 3. Small amount of debris in the bladder with no definite calculi.   Electronically Signed   By: Beckie Salts M.D.   On: 07/20/2014 12:27   Dg Chest Port 1 View  07/22/2014   CLINICAL DATA:  Acute onset of hypoxemia.  Initial encounter.  EXAM: PORTABLE CHEST - 1 VIEW  COMPARISON:  Chest radiograph performed 07/20/2014  FINDINGS: The lungs are well-aerated. Worsening bilateral midlung airspace opacification raises concern for multifocal pneumonia. Small bilateral pleural effusions are seen, and underlying interstitial edema cannot be excluded. Vascular congestion is noted. No pneumothorax is seen.  The cardiomediastinal silhouette is mildly enlarged. The patient is status post median sternotomy. A pacemaker is noted overlying the left chest wall, with leads ending overlying the right atrium and right ventricle. No acute osseous abnormalities are seen.  IMPRESSION: 1. Worsening bilateral mid lung airspace opacification raises concern for multifocal pneumonia. 2. Small bilateral pleural effusions, with vascular congestion and mild cardiomegaly.  Underlying interstitial edema cannot be excluded.   Electronically Signed   By: Roanna Raider M.D.   On: 07/22/2014 04:30    PHYSICAL EXAM General: NAD Neck: JVP 14 cm, no thyromegaly or thyroid nodule.  Lungs: Decreased breath sounds at bases bilaterally.   CV: Nondisplaced PMI.  Heart regular S1/S2, no S3/S4, 2/6 HSM LLSB/apex.  Trace ankle edema.   Abdomen: Soft, nontender, no hepatosplenomegaly, no distention.  Neurologic: Alert and oriented x 3.  Psych: Normal affect. Extremities: No clubbing or cyanosis.   TELEMETRY: Reviewed telemetry pt in NSR with BiV pacing    Assessment:   1. Acute respiratory failure: Suspect CHF + possible PNA 2. Acute on chronic systolic heart failure EF 30-35% 3. Acute bronchitis 4. CKD, stage III-IV 5. PAF in NSR on amio - CHDSVASC 5. On warfarin 6. Multifocal PNA  Plan/Discussion:    Initial presentation seems more infectious than HF.  Creatinine was up and diuretics were  held.  Now appears volume overloaded. Not responding well to lasix 80 iv bid after 2 doses.  Creatinine is stable.  Continue IV Lasix, will add dose of metolazone 2.5 mg x 1 today. Will interrogate pacer and assess impedance today. Agree with broad spectrum abx.  Continue amio and warfarin for AF, he is in NSR. Watch renal function closely.   Marca Ancona 07/23/2014 9:45 AM  Optivol does not suggest volume overload.  May be that most of symptoms are due to PNA. Will stop IV Lasix.   Marca Ancona 07/23/2014 12:53 PM

## 2014-07-24 ENCOUNTER — Institutional Professional Consult (permissible substitution): Payer: Commercial Managed Care - HMO | Admitting: Pulmonary Disease

## 2014-07-24 LAB — PROTIME-INR
INR: 2.27 — ABNORMAL HIGH (ref 0.00–1.49)
PROTHROMBIN TIME: 25.2 s — AB (ref 11.6–15.2)

## 2014-07-24 LAB — URINALYSIS, ROUTINE W REFLEX MICROSCOPIC
Bilirubin Urine: NEGATIVE
Glucose, UA: NEGATIVE mg/dL
Hgb urine dipstick: NEGATIVE
Ketones, ur: NEGATIVE mg/dL
LEUKOCYTES UA: NEGATIVE
Nitrite: NEGATIVE
Protein, ur: NEGATIVE mg/dL
Specific Gravity, Urine: 1.015 (ref 1.005–1.030)
UROBILINOGEN UA: 0.2 mg/dL (ref 0.0–1.0)
pH: 5 (ref 5.0–8.0)

## 2014-07-24 LAB — BASIC METABOLIC PANEL
Anion gap: 15 (ref 5–15)
BUN: 69 mg/dL — AB (ref 6–23)
CO2: 23 mmol/L (ref 19–32)
CREATININE: 2.15 mg/dL — AB (ref 0.50–1.35)
Calcium: 8.6 mg/dL (ref 8.4–10.5)
Chloride: 95 mmol/L — ABNORMAL LOW (ref 96–112)
GFR, EST AFRICAN AMERICAN: 31 mL/min — AB (ref >90)
GFR, EST NON AFRICAN AMERICAN: 27 mL/min — AB (ref >90)
Glucose, Bld: 108 mg/dL — ABNORMAL HIGH (ref 70–99)
Potassium: 4.8 mmol/L (ref 3.5–5.1)
Sodium: 133 mmol/L — ABNORMAL LOW (ref 135–145)

## 2014-07-24 LAB — CBC
HCT: 28.9 % — ABNORMAL LOW (ref 39.0–52.0)
HEMOGLOBIN: 10.1 g/dL — AB (ref 13.0–17.0)
MCH: 31.6 pg (ref 26.0–34.0)
MCHC: 34.9 g/dL (ref 30.0–36.0)
MCV: 90.3 fL (ref 78.0–100.0)
Platelets: 340 10*3/uL (ref 150–400)
RBC: 3.2 MIL/uL — AB (ref 4.22–5.81)
RDW: 16 % — AB (ref 11.5–15.5)
WBC: 16.5 10*3/uL — AB (ref 4.0–10.5)

## 2014-07-24 LAB — MAGNESIUM: MAGNESIUM: 2.2 mg/dL (ref 1.5–2.5)

## 2014-07-24 LAB — EXPECTORATED SPUTUM ASSESSMENT W REFEX TO RESP CULTURE

## 2014-07-24 LAB — EXPECTORATED SPUTUM ASSESSMENT W GRAM STAIN, RFLX TO RESP C

## 2014-07-24 MED ORDER — PREDNISONE 20 MG PO TABS
20.0000 mg | ORAL_TABLET | Freq: Every day | ORAL | Status: DC
  Administered 2014-07-25 – 2014-07-28 (×4): 20 mg via ORAL
  Filled 2014-07-24 (×5): qty 1

## 2014-07-24 MED ORDER — WARFARIN SODIUM 2 MG PO TABS
2.0000 mg | ORAL_TABLET | Freq: Every day | ORAL | Status: DC
  Administered 2014-07-24 – 2014-07-27 (×4): 2 mg via ORAL
  Filled 2014-07-24 (×5): qty 1

## 2014-07-24 MED ORDER — IPRATROPIUM-ALBUTEROL 0.5-2.5 (3) MG/3ML IN SOLN
3.0000 mL | Freq: Three times a day (TID) | RESPIRATORY_TRACT | Status: DC
  Administered 2014-07-25 – 2014-08-03 (×26): 3 mL via RESPIRATORY_TRACT
  Filled 2014-07-24 (×29): qty 3

## 2014-07-24 MED ORDER — ACETAMINOPHEN 500 MG PO TABS
500.0000 mg | ORAL_TABLET | Freq: Four times a day (QID) | ORAL | Status: DC | PRN
  Administered 2014-07-25 – 2014-07-26 (×4): 500 mg via ORAL
  Filled 2014-07-24 (×4): qty 1

## 2014-07-24 NOTE — Progress Notes (Signed)
[facility] PROGRESS NOTE  Timothy Durham POL:[id number] DOB: May 12, 1930 DOA: 07/19/2014 PCP: Dennis Bast, MD   brief narrative 81 year old male with history of nonischemic cardiomyopathy with EF of 30-35% as per last echo, NST EMI in 2015 with cardiac cath showing nonobstructive CAD, chronic kidney disease stage III, history of stroke in 2000, history of bioprosthetic aVR, paroxysmal A. fib on chronic Coumadin, tobacco use with? COPD who presented with progressive shortness of breath for the past 3 days associated with nonproductive cough and wheezing. She reports being compliant with these medications. Chest x-ray on admission showing moderate vascular congestion. Labs showed elevated BNP of 1400. Patient admitted for acute hypoxic respiratory failure due to acute bronchitis. During the hospital stay he became increasingly short of breath and wheezy with follow-up chest x-ray showing multifocal pneumonia and volume overload.   Assessment/Plan: Acute hypoxic respiratory failure Initially admitted for acute bronchitis but during the hospital course became increasingly short of breath with multifocal pneumonia on chest x-ray.  broaden antibiotic coverage with empiric vancomycin and cefepime for HCAP  passed swallow eval on 4/27 with regular diet and thin liquid. Continue O2 via nasal cannula and scheduled duonebs. Continue  oral prednisone. Taper as able -reported worsening of sob, chest CT ordered.  Nonischemic cardiomyopathy with acute exacerbation. - euvolemic on admission but in volume overloaded overnight triggered by underlying pneumonia.. Continue Plavix, Coreg hydralazine, imdur and statin..  Treated with IV Lasix  And oral Aldactone. D/ced due to cr elevation. Monitor strict I/O and daily weight Not on ACEi/ ARB due to CKD Heart failure team consulted  Acute on chronic kidney disease stage III Baseline creatinine around 1.8-2.  Cr worsening, diuretic held. [place] abd shows medical  renal ds. Repeat ua pending. Avoid renal toxin.   History of CVA and peripheral vascular disease Continue Plavix and statin.  Atrial fibrillation Pace rhythm, Rate controlled. Continue amiodarone and Coumadin. Dosing per pharmacy  GERD Continue PPI  DVT prophylaxis: On Coumadin Diet: Heart healthy  Code Status: DO NOT RESUSCITATE  Family Communication: discussed in detail with patient   Disposition Plan: Monitor on telemetry. Home vs rehab   Consultants:  Heart failure  Procedures:  None  Antibiotics:  Z-Pak  Vanc/cefepime from 4/24  HPI/Subjective:  No cough noted today, but upon entering the room patient reported sob just started this pm, denies chest pain.   Objective: Filed Vitals:   07/24/14 1353  BP: 136/56  Pulse: 97  Temp: 97.4 F (36.3 C)  Resp:     Intake/Output Summary (Last 24 hours) at 07/24/14 1408 Last data filed at 07/24/14 1356  Gross per 24 hour  Intake   1460 ml  Output   1050 ml  Net    410 ml   Filed Weights   07/22/14 0658 07/23/14 0544 07/24/14 0516  Weight: 68.584 kg (151 lb 3.2 oz) 68.493 kg (151 lb) 69.854 kg (154 lb)    Exam:   General:   Elderly male in NAD  HEENT:  JVD+, supple neck, moist oral mucosa  Chest: Scattered rhonchi bilaterally, Left basilar crackles, mild faint wheezing   CVS: S1 and S2 is irregularly irregular, no murmurs rub or gallop  GI: Soft, nondistended, nontender, BS+  Musculoskeletal: Warm, no edema  Cns: Alert and oriented     Data Reviewed: Basic Metabolic Panel:  Recent Labs Lab 07/20/14 0028 07/21/14 0500 07/23/14 0455 07/24/14 0340  NA 131* 131* 132* 133*  K 4.0 3.6 4.0 4.8  CL 92* 93* 93* 95*  CO2  GLUCOSE 206* 124* 113* 108*  BUN 70* 57* 62* 69*  CREATININE 2.65* 1.94* 1.86* 2.15*  CALCIUM 8.4 8.4 8.5 8.6  MG  --   --   --  2.2   Liver Function Tests:  Recent Labs Lab 07/20/14 0028  AST 31  ALT 23  ALKPHOS 92  BILITOT 1.2  PROT 6.2   ALBUMIN 2.9*   No results for input(s): LIPASE, AMYLASE in the last 168 hours. No results for input(s): AMMONIA in the last 168 hours. CBC:  Recent Labs Lab 07/20/14 0028 07/24/14 0340  WBC 12.2* 16.5*  NEUTROABS 11.3*  --   HGB 10.2* 10.1*  HCT 29.9* 28.9*  MCV 94.9 90.3  PLT 242 340   Cardiac Enzymes: No results for input(s): CKTOTAL, CKMB, CKMBINDEX, TROPONINI in the last 168 hours. BNP (last 3 results)  Recent Labs  05/04/14 1700 07/20/14 0028  BNP 1013.0* 1469.3*    ProBNP (last 3 results)  Recent Labs  02/24/14 1606 02/26/14 1623 03/06/14 1129  PROBNP 2973.0* 18948.0* 5943.0*    CBG: No results for input(s): GLUCAP in the last 168 hours.  Recent Results (from the past 240 hour(s))  Culture, blood (routine x 2)     Status: None (Preliminary result)   Collection Time: 07/20/14  2:10 AM  Result Value Ref Range Status   Specimen Description BLOOD RIGHT FOREARM  Final   Special Requests BOTTLES DRAWN AEROBIC AND ANAEROBIC 5CC EA  Final   Culture   Final           BLOOD CULTURE RECEIVED NO GROWTH TO DATE CULTURE WILL BE HELD FOR 5 DAYS BEFORE ISSUING A FINAL NEGATIVE REPORT Performed at [facility]    Report Status PENDING  Incomplete  Culture, blood (routine x 2)     Status: None (Preliminary result)   Collection Time: 07/20/14  2:19 AM  Result Value Ref Range Status   Specimen Description BLOOD LEFT FOREARM  Final   Special Requests BOTTLES DRAWN AEROBIC ONLY 4CC  Final   Culture   Final           BLOOD CULTURE RECEIVED NO GROWTH TO DATE CULTURE WILL BE HELD FOR 5 DAYS BEFORE ISSUING A FINAL NEGATIVE REPORT Performed at [facility]    Report Status PENDING  Incomplete  MRSA PCR Screening     Status: None   Collection Time: 07/20/14  4:45 AM  Result Value Ref Range Status   MRSA by PCR NEGATIVE NEGATIVE Final    Comment:        The GeneXpert MRSA Assay (FDA approved for NASAL specimens only), is one component of  a comprehensive MRSA colonization surveillance program. It is not intended to diagnose MRSA infection nor to guide or monitor treatment for MRSA infections.   Culture, expectorated sputum-assessment     Status: None   Collection Time: 07/23/14 11:23 PM  Result Value Ref Range Status   Specimen Description SPUTUM  Final   Special Requests NONE  Final   Sputum evaluation   Final    MICROSCOPIC FINDINGS SUGGEST THAT THIS SPECIMEN IS NOT REPRESENTATIVE OF LOWER RESPIRATORY SECRETIONS. PLEASE RECOLLECT. RESULT CALLED TO, READ BACK BY AND VERIFIED WITH: NOTIFIED G RUSUL,RN 07/24/14 AT 0040 BY RHOLMES   Report Status 07/24/2014 FINAL  Final     Studies: No results found.  Scheduled Meds: . allopurinol  200 mg Oral Daily  . amiodarone  200 mg Oral Daily  . atorvastatin  40 mg Oral QPM  . carvedilol  6.25 mg Oral BID WC  . ceFEPime (MAXIPIME) IV  1 g Intravenous Q24H  . clopidogrel  75 mg Oral Daily  . DULoxetine  60 mg Oral Daily  . hydrALAZINE  12.5 mg Oral BID  . ipratropium-albuterol  3 mL Nebulization Q6H  . isosorbide mononitrate  30 mg Oral Daily  . pantoprazole  80 mg Oral Daily  . [START ON 07/25/2014] predniSONE  20 mg Oral Q breakfast  . sodium chloride  3 mL Intravenous Q12H  . tamsulosin  0.4 mg Oral QHS  . vancomycin  1,000 mg Intravenous Q24H  . warfarin  2 mg Oral q1800  . Warfarin - Pharmacist Dosing Inpatient   Does not apply q1800   Continuous Infusions:     Time spent:35 minutes    Trammell Bowden  [facility] Pager [phone]. If 7PM-7AM, please contact night-coverage at [email or web address], password [facility] 07/24/2014, 2:08 PM  LOS: 4 days

## 2014-07-24 NOTE — Progress Notes (Signed)
Patient ID: Timothy Durham, male   DOB: 06-22-1930, 81 y.o.   MRN: [id number]   Timothy Durham is an 81 yo male with a history of prior CVA in 2000 secondary to PAF, nonischemic cardimoyopathy s/p [employer] CRT-P (02/17/14), chronic systolic HF (EF 30-35%), non-obstructive CAD, HTN, CKD stage IV, HLD, PAD and bioprosthetic AVR.   Followed closely in HF clinic and recently doing quite well. Diuretics had to be cut back a bit due to hypotension. Weight has been stable around 154 pounds.   Admitted 4/24 ago with worsening SOB with fevers and chills with WBC 12.2 and left shift. Weight stable. Initial CXR with mild vascular congestion. Felt to have acute bronchitis. However dyspnea got worse and CXR 4/26 showed worsening bilateral infiltrates ? HF vs multifocal PNA. Now on IV lasix and broad spectrub abx.  Yesterday optivol did not show fluid so diuretics held. Today weight up 3 pounds.  Creatinine up 2.1   Scheduled Meds: . allopurinol  200 mg Oral Daily  . amiodarone  200 mg Oral Daily  . atorvastatin  40 mg Oral QPM  . carvedilol  6.25 mg Oral BID WC  . ceFEPime (MAXIPIME) IV  1 g Intravenous Q24H  . clopidogrel  75 mg Oral Daily  . DULoxetine  60 mg Oral Daily  . hydrALAZINE  12.5 mg Oral BID  . ipratropium-albuterol  3 mL Nebulization Q6H  . isosorbide mononitrate  30 mg Oral Daily  . pantoprazole  80 mg Oral Daily  . [START ON 07/25/2014] predniSONE  20 mg Oral Q breakfast  . sodium chloride  3 mL Intravenous Q12H  . spironolactone  25 mg Oral Daily  . tamsulosin  0.4 mg Oral QHS  . vancomycin  1,000 mg Intravenous Q24H  . Warfarin - Pharmacist Dosing Inpatient   Does not apply q1800   Continuous Infusions:  PRN Meds:.acetaminophen, albuterol   Filed Vitals:   07/23/14 1500 07/23/14 2126 07/24/14 0516 07/24/14 0747  BP: 115/45 110/68 126/49   Pulse: 80 88 86   Temp: 97.6 F (36.4 C) 98 F (36.7 C) 98.1 F (36.7 C)   TempSrc: Oral Oral Oral   Resp:  20 18   Height:      Weight:    154 lb (69.854 kg)   SpO2: 96% 95% 93% 93%    Intake/Output Summary (Last 24 hours) at 07/24/14 0910 Last data filed at 07/24/14 0906  Gross per 24 hour  Intake   1620 ml  Output    500 ml  Net   1120 ml    LABS: Basic Metabolic Panel:  Recent Labs  40/98/11 0455 07/24/14 0340  NA 132* 133*  K 4.0 4.8  CL 93* 95*  CO2 26 23  GLUCOSE 113* 108*  BUN 62* 69*  CREATININE 1.86* 2.15*  CALCIUM 8.5 8.6  MG  --  2.2   Liver Function Tests: No results for input(s): AST, ALT, ALKPHOS, BILITOT, PROT, ALBUMIN in the last 72 hours. No results for input(s): LIPASE, AMYLASE in the last 72 hours. CBC:  Recent Labs  07/24/14 0340  WBC 16.5*  HGB 10.1*  HCT 28.9*  MCV 90.3  PLT 340   Cardiac Enzymes: No results for input(s): CKTOTAL, CKMB, CKMBINDEX, TROPONINI in the last 72 hours. BNP: Invalid input(s): POCBNP D-Dimer: No results for input(s): DDIMER in the last 72 hours. Hemoglobin A1C: No results for input(s): HGBA1C in the last 72 hours. Fasting Lipid Panel: No results for input(s): CHOL, HDL, LDLCALC, TRIG, CHOLHDL,  LDLDIRECT in the last 72 hours. Thyroid Function Tests: No results for input(s): TSH, T4TOTAL, T3FREE, THYROIDAB in the last 72 hours.  Invalid input(s): FREET3 Anemia Panel: No results for input(s): VITAMINB12, FOLATE, FERRITIN, TIBC, IRON, RETICCTPCT in the last 72 hours.  RADIOLOGY: Dg Chest 2 View  07/20/2014   CLINICAL DATA:  Shortness of breath.  Pacemaker.  EXAM: CHEST  2 VIEW  COMPARISON:  05/04/2014.  FINDINGS: Mildly increased cardiac silhouette. Triple lead pacer unchanged from priors. Moderate vascular congestion without overt failure or significant consolidation. Mild to moderate pleural effusions, greater on the RIGHT. Aortic valvular placement with prior median sternotomy. No acute osseous findings.  IMPRESSION: Cardiomegaly with stable appearance of the transvenous pacer.  Moderate vascular congestion without overt failure. Overall improved  from prior radiograph in February.   Electronically Signed   By: Davonna Belling M.D.   On: 07/20/2014 02:23   US Renal  07/20/2014   CLINICAL DATA:  Acute kidney injury.  Hypertension.  EXAM: RENAL/URINARY TRACT ULTRASOUND COMPLETE  COMPARISON:  None.  FINDINGS: Right Kidney:  Length: 10.3 cm. Echogenicity within normal limits. No mass or hydronephrosis visualized. Diffuse cortical thinning with prominent renal sinus fat.  Left Kidney:  Length: 11.1 cm. Echogenicity within normal limits. No mass or hydronephrosis visualized. Diffuse cortical thinning with prominent renal sinus fat.  Bladder:  Multiple diverticula. The largest measures 4.9 cm in maximum diameter. Small amount of internal debris with no definite calculi  IMPRESSION: 1. Diffuse bilateral renal cortical atrophy. 2. Multiple bladder diverticula. 3. Small amount of debris in the bladder with no definite calculi.   Electronically Signed   By: Beckie Salts M.D.   On: 07/20/2014 12:27   Dg Chest Port 1 View  07/22/2014   CLINICAL DATA:  Acute onset of hypoxemia.  Initial encounter.  EXAM: PORTABLE CHEST - 1 VIEW  COMPARISON:  Chest radiograph performed 07/20/2014  FINDINGS: The lungs are well-aerated. Worsening bilateral midlung airspace opacification raises concern for multifocal pneumonia. Small bilateral pleural effusions are seen, and underlying interstitial edema cannot be excluded. Vascular congestion is noted. No pneumothorax is seen.  The cardiomediastinal silhouette is mildly enlarged. The patient is status post median sternotomy. A pacemaker is noted overlying the left chest wall, with leads ending overlying the right atrium and right ventricle. No acute osseous abnormalities are seen.  IMPRESSION: 1. Worsening bilateral mid lung airspace opacification raises concern for multifocal pneumonia. 2. Small bilateral pleural effusions, with vascular congestion and mild cardiomegaly. Underlying interstitial edema cannot be excluded.   Electronically  Signed   By: Roanna Raider M.D.   On: 07/22/2014 04:30    PHYSICAL EXAM General: NAD. Sitting in chair Neck: JVP  6-7 cm, no thyromegaly or thyroid nodule.  Lungs: Decreased breath sounds at bases bilaterally.   CV: Nondisplaced PMI.  Heart regular S1/S2, no S3/S4, 2/6 HSM LLSB/apex.  Trace ankle edema.   Abdomen: Soft, nontender, no hepatosplenomegaly, no distention.  Neurologic: Alert and oriented x 3.  Psych: Normal affect. Extremities: No clubbing or cyanosis.   TELEMETRY: Reviewed telemetry pt in NSR with BiV pacing    Assessment:   1. Acute respiratory failure: Suspect CHF + possible PNA 2. Acute on chronic systolic heart failure EF 30-35% 3. Acute bronchitis 4. CKD, stage III-IV 5. PAF in NSR on amio - CHDSVASC 5. On warfarin 6. Multifocal PNA  Plan/Discussion:  From HF perspective he is stable. Volume status low. Creatinine/BUN trending up. Continue to hold diuretics. Hold spiro. Continue current dose  of carvedilol, hydralazine, and imdur. No Ace with CKD.   Continue antibiotics per primary team.   Consult cardiac rehab.        CLEGG,AMY 07/24/2014 9:10 AM      Patient seen and examined with Tonye Becket, NP. We discussed all aspects of the encounter. I agree with the assessment and plan as stated above.   Volume status ok. Has diffuse PNA on CXR. Agree with noncontrast chest CT. Will likely need to restart diuretics in next day or two.    Truman Hayward 8:33 PM

## 2014-07-24 NOTE — Progress Notes (Signed)
ANTICOAGULATION CONSULT NOTE  Pharmacy Consult for Coumadin Indication: atrial fibrillation  Allergies  Allergen Reactions  . Ambien [Zolpidem] Other (See Comments)    Hallucinations  . Ativan [Lorazepam] Other (See Comments)    hallucinations  . Oxycontin [Oxycodone] Other (See Comments)    hallucinations  . Penicillins     Unknown childhood reaction   . Sulfa Antibiotics     unknown    Patient Measurements: Height: 5\' 10"  (177.8 cm) Weight: 154 lb (69.854 kg) (b scale; rn notified of weight gain) IBW/kg (Calculated) : 73  Vital Signs: Temp: 98.1 F (36.7 C) (04/28 0516) Temp Source: Oral (04/28 0516) BP: 126/49 mmHg (04/28 0516) Pulse Rate: 86 (04/28 0516)  Labs:  Recent Labs  07/22/14 0600 07/23/14 0455 07/24/14 0340  HGB  --   --  10.1*  HCT  --   --  28.9*  PLT  --   --  340  LABPROT 24.9* 24.3* 25.2*  INR 2.24* 2.16* 2.27*  CREATININE  --  1.86* 2.15*    Estimated Creatinine Clearance: 26.6 mL/min (by C-G formula based on Cr of 2.15).  Assessment: 81yo male to continue on pta coumadin for PAF (last pta dose 4/23). INR 2.99 on admit Also on amio po pta. Warfarin held on 4/24.  INR down to 2.2 this morning. CBC stable- no bleeding noted.  PTA warfarin dose: 2mg  daily  Goal of Therapy:  INR 2-3   Plan:  -Resume home dose of warfarin 2mg  -Monitor daily PT/INR  -follow for s/sx bleeding  Sheppard Coil PharmD., BCPS Clinical Pharmacist Pager [phone] 07/24/2014 9:13 AM

## 2014-07-25 ENCOUNTER — Encounter (HOSPITAL_COMMUNITY): Payer: Self-pay

## 2014-07-25 ENCOUNTER — Inpatient Hospital Stay (HOSPITAL_COMMUNITY): Payer: Medicare HMO

## 2014-07-25 DIAGNOSIS — I5020 Unspecified systolic (congestive) heart failure: Secondary | ICD-10-CM

## 2014-07-25 DIAGNOSIS — R0902 Hypoxemia: Secondary | ICD-10-CM | POA: Insufficient documentation

## 2014-07-25 DIAGNOSIS — D72829 Elevated white blood cell count, unspecified: Secondary | ICD-10-CM

## 2014-07-25 DIAGNOSIS — Z7901 Long term (current) use of anticoagulants: Secondary | ICD-10-CM

## 2014-07-25 DIAGNOSIS — R918 Other nonspecific abnormal finding of lung field: Secondary | ICD-10-CM

## 2014-07-25 DIAGNOSIS — R042 Hemoptysis: Secondary | ICD-10-CM

## 2014-07-25 DIAGNOSIS — I4891 Unspecified atrial fibrillation: Secondary | ICD-10-CM

## 2014-07-25 DIAGNOSIS — J189 Pneumonia, unspecified organism: Secondary | ICD-10-CM | POA: Diagnosis present

## 2014-07-25 DIAGNOSIS — J411 Mucopurulent chronic bronchitis: Secondary | ICD-10-CM | POA: Insufficient documentation

## 2014-07-25 DIAGNOSIS — Z952 Presence of prosthetic heart valve: Secondary | ICD-10-CM

## 2014-07-25 LAB — STREP PNEUMONIAE URINARY ANTIGEN: Strep Pneumo Urinary Antigen: NEGATIVE

## 2014-07-25 LAB — CBC
HCT: 27.6 % — ABNORMAL LOW (ref 39.0–52.0)
Hemoglobin: 9.8 g/dL — ABNORMAL LOW (ref 13.0–17.0)
MCH: 31.4 pg (ref 26.0–34.0)
MCHC: 35.5 g/dL (ref 30.0–36.0)
MCV: 88.5 fL (ref 78.0–100.0)
PLATELETS: 325 10*3/uL (ref 150–400)
RBC: 3.12 MIL/uL — ABNORMAL LOW (ref 4.22–5.81)
RDW: 15.9 % — ABNORMAL HIGH (ref 11.5–15.5)
WBC: 16.7 10*3/uL — ABNORMAL HIGH (ref 4.0–10.5)

## 2014-07-25 LAB — PROTIME-INR
INR: 2.34 — AB (ref 0.00–1.49)
PROTHROMBIN TIME: 25.8 s — AB (ref 11.6–15.2)

## 2014-07-25 LAB — PROCALCITONIN: PROCALCITONIN: 0.53 ng/mL

## 2014-07-25 LAB — BASIC METABOLIC PANEL
Anion gap: 12 (ref 5–15)
BUN: 68 mg/dL — ABNORMAL HIGH (ref 6–23)
CALCIUM: 8.8 mg/dL (ref 8.4–10.5)
CHLORIDE: 96 mmol/L (ref 96–112)
CO2: 24 mmol/L (ref 19–32)
CREATININE: 2.02 mg/dL — AB (ref 0.50–1.35)
GFR calc Af Amer: 34 mL/min — ABNORMAL LOW (ref >90)
GFR, EST NON AFRICAN AMERICAN: 29 mL/min — AB (ref >90)
GLUCOSE: 104 mg/dL — AB (ref 70–99)
Potassium: 4.5 mmol/L (ref 3.5–5.1)
Sodium: 132 mmol/L — ABNORMAL LOW (ref 135–145)

## 2014-07-25 LAB — SEDIMENTATION RATE: Sed Rate: 134 mm/hr — ABNORMAL HIGH (ref 0–16)

## 2014-07-25 LAB — EXPECTORATED SPUTUM ASSESSMENT W REFEX TO RESP CULTURE: SPECIAL REQUESTS: NORMAL

## 2014-07-25 LAB — EXPECTORATED SPUTUM ASSESSMENT W GRAM STAIN, RFLX TO RESP C: Special Requests: NORMAL

## 2014-07-25 MED ORDER — FUROSEMIDE 10 MG/ML IJ SOLN
80.0000 mg | Freq: Once | INTRAMUSCULAR | Status: AC
  Administered 2014-07-25: 80 mg via INTRAVENOUS
  Filled 2014-07-25: qty 8

## 2014-07-25 MED ORDER — AZITHROMYCIN 250 MG PO TABS
250.0000 mg | ORAL_TABLET | Freq: Every day | ORAL | Status: AC
  Administered 2014-07-25 – 2014-07-27 (×3): 250 mg via ORAL
  Filled 2014-07-25 (×3): qty 1

## 2014-07-25 NOTE — Progress Notes (Signed)
Patient ID: Timothy Durham, male   DOB: [birth year], 82 y.o.   MRN: [id number]   Timothy Durham is an 81 yo male with a history of prior CVA in 2000 secondary to PAF, nonischemic cardimoyopathy s/p Medtronic CRT-P (02/17/14), chronic systolic HF (EF 30-35%), non-obstructive CAD, HTN, CKD stage IV, HLD, PAD and bioprosthetic AVR.   Followed closely in HF clinic and recently doing quite well. Diuretics had to be cut back a bit due to hypotension. Weight has been stable around 154 pounds.   Admitted 4/24 ago with worsening SOB with fevers and chills with WBC 12.2 and left shift. Weight stable. Initial CXR with mild vascular congestion. Felt to have acute bronchitis. However dyspnea got worse and CXR 4/26 showed worsening bilateral infiltrates ? HF vs multifocal PNA. Now on IV lasix and broad spectrub abx.  Yesterday diuretics placed on hold. Today weight up 1 pound.  More SOB. Creatinine up 2.1 >2.0. CT chest (images reviewed personally) with diffuse, severe multilobar PNA. With mild overlying edema.   Scheduled Meds: . allopurinol  200 mg Oral Daily  . amiodarone  200 mg Oral Daily  . atorvastatin  40 mg Oral QPM  . carvedilol  6.25 mg Oral BID WC  . ceFEPime (MAXIPIME) IV  1 g Intravenous Q24H  . clopidogrel  75 mg Oral Daily  . DULoxetine  60 mg Oral Daily  . hydrALAZINE  12.5 mg Oral BID  . ipratropium-albuterol  3 mL Nebulization TID  . isosorbide mononitrate  30 mg Oral Daily  . pantoprazole  80 mg Oral Daily  . predniSONE  20 mg Oral Q breakfast  . sodium chloride  3 mL Intravenous Q12H  . tamsulosin  0.4 mg Oral QHS  . vancomycin  1,000 mg Intravenous Q24H  . warfarin  2 mg Oral q1800  . Warfarin - Pharmacist Dosing Inpatient   Does not apply q1800   Continuous Infusions:  PRN Meds:.acetaminophen, albuterol   Filed Vitals:   07/25/14 0455 07/25/14 0509 07/25/14 0523 07/25/14 0800  BP:   130/47   Pulse:   93   Temp:   97.7 F (36.5 C)   TempSrc:   Oral   Resp:   20   Height:        Weight:    155 lb 3.3 oz (70.4 kg)  SpO2: 88% 95% 95%     Intake/Output Summary (Last 24 hours) at 07/25/14 0843 Last data filed at 07/25/14 0600  Gross per 24 hour  Intake   1250 ml  Output   1625 ml  Net   -375 ml    LABS: Basic Metabolic Panel:  Recent Labs  40/98/11 0340 07/25/14 0431  NA 133* 132*  K 4.8 4.5  CL 95* 96  CO2 23 24  GLUCOSE 108* 104*  BUN 69* 68*  CREATININE 2.15* 2.02*  CALCIUM 8.6 8.8  MG 2.2  --    Liver Function Tests: No results for input(s): AST, ALT, ALKPHOS, BILITOT, PROT, ALBUMIN in the last 72 hours. No results for input(s): LIPASE, AMYLASE in the last 72 hours. CBC:  Recent Labs  07/24/14 0340 07/25/14 0431  WBC 16.5* 16.7*  HGB 10.1* 9.8*  HCT 28.9* 27.6*  MCV 90.3 88.5  PLT 340 325   Cardiac Enzymes: No results for input(s): CKTOTAL, CKMB, CKMBINDEX, TROPONINI in the last 72 hours. BNP: Invalid input(s): POCBNP D-Dimer: No results for input(s): DDIMER in the last 72 hours. Hemoglobin A1C: No results for input(s): HGBA1C in the last 72  hours. Fasting Lipid Panel: No results for input(s): CHOL, HDL, LDLCALC, TRIG, CHOLHDL, LDLDIRECT in the last 72 hours. Thyroid Function Tests: No results for input(s): TSH, T4TOTAL, T3FREE, THYROIDAB in the last 72 hours.  Invalid input(s): FREET3 Anemia Panel: No results for input(s): VITAMINB12, FOLATE, FERRITIN, TIBC, IRON, RETICCTPCT in the last 72 hours.  RADIOLOGY: Dg Chest 2 View  07/20/2014   CLINICAL DATA:  Shortness of breath.  Pacemaker.  EXAM: CHEST  2 VIEW  COMPARISON:  05/04/2014.  FINDINGS: Mildly increased cardiac silhouette. Triple lead pacer unchanged from priors. Moderate vascular congestion without overt failure or significant consolidation. Mild to moderate pleural effusions, greater on the RIGHT. Aortic valvular placement with prior median sternotomy. No acute osseous findings.  IMPRESSION: Cardiomegaly with stable appearance of the transvenous pacer.  Moderate  vascular congestion without overt failure. Overall improved from prior radiograph in February.   Electronically Signed   By: Davonna Belling M.D.   On: 07/20/2014 02:23   Ct Chest Wo Contrast  07/25/2014   CLINICAL DATA:  Hypoxia and progressive shortness of breath for the past 3 days. Nonproductive cough and wheeze  EXAM: CT CHEST WITHOUT CONTRAST  TECHNIQUE: Multidetector CT imaging of the chest was performed following the standard protocol without IV contrast.  COMPARISON:  None.  FINDINGS: THORACIC INLET/BODY WALL:  There is a biventricular pacer from the left with leads in unremarkable position. No acute findings at the thoracic inlet.  There is an incidental 1 cm intramuscular lipoma in the right chest wall.  MEDIASTINUM:  Cardiomegaly which is chronic based on chest x-rays. No pericardial effusion. The patient is status post median sternotomy with aortic valve replacement. The ascending aorta measures 41 mm in diameter. There is mild enlargement of mediastinal lymph nodes which could be reactive to the presumed pneumonia or sequela of remote granulomatous disease (there is also scattered nodal coarse calcification). No acute vascular findings. Negative esophagus.  LUNG WINDOWS:  There is patchy consolidative and ground-glass opacities in all lobes. Airspace disease is most extensive in the left upper lobe and most dense in the posterior segment right upper lobe. Small bilateral pleural effusions with interlobular septal thickening. No cavitation. No evidence of loculated effusion.  UPPER ABDOMEN:  Granulomatous changes in the liver and spleen.  OSSEOUS:  No acute findings.  IMPRESSION: 1. Multilobar pneumonia. 2. Superimposed interstitial edema and small pleural effusions. 3. Chronic and postoperative findings noted above.   Electronically Signed   By: Marnee Spring M.D.   On: 07/25/2014 08:00   US Renal  07/20/2014   CLINICAL DATA:  Acute kidney injury.  Hypertension.  EXAM: RENAL/URINARY TRACT  ULTRASOUND COMPLETE  COMPARISON:  None.  FINDINGS: Right Kidney:  Length: 10.3 cm. Echogenicity within normal limits. No mass or hydronephrosis visualized. Diffuse cortical thinning with prominent renal sinus fat.  Left Kidney:  Length: 11.1 cm. Echogenicity within normal limits. No mass or hydronephrosis visualized. Diffuse cortical thinning with prominent renal sinus fat.  Bladder:  Multiple diverticula. The largest measures 4.9 cm in maximum diameter. Small amount of internal debris with no definite calculi  IMPRESSION: 1. Diffuse bilateral renal cortical atrophy. 2. Multiple bladder diverticula. 3. Small amount of debris in the bladder with no definite calculi.   Electronically Signed   By: Beckie Salts M.D.   On: 07/20/2014 12:27   Dg Chest Port 1 View  07/22/2014   CLINICAL DATA:  Acute onset of hypoxemia.  Initial encounter.  EXAM: PORTABLE CHEST - 1 VIEW  COMPARISON:  Chest radiograph performed 07/20/2014  FINDINGS: The lungs are well-aerated. Worsening bilateral midlung airspace opacification raises concern for multifocal pneumonia. Small bilateral pleural effusions are seen, and underlying interstitial edema cannot be excluded. Vascular congestion is noted. No pneumothorax is seen.  The cardiomediastinal silhouette is mildly enlarged. The patient is status post median sternotomy. A pacemaker is noted overlying the left chest wall, with leads ending overlying the right atrium and right ventricle. No acute osseous abnormalities are seen.  IMPRESSION: 1. Worsening bilateral mid lung airspace opacification raises concern for multifocal pneumonia. 2. Small bilateral pleural effusions, with vascular congestion and mild cardiomegaly. Underlying interstitial edema cannot be excluded.   Electronically Signed   By: Roanna Raider M.D.   On: 07/22/2014 04:30    PHYSICAL EXAM General: NAD. Sitting in chair Neck: JVP  6-7 cm, no thyromegaly or thyroid nodule.  Lungs: + crackles Decreased breath sounds at bases  bilaterally.   CV: Nondisplaced PMI.  Heart regular S1/S2, no S3/S4, 2/6 HSM LLSB/apex.  Trace ankle edema.   Abdomen: Soft, nontender, no hepatosplenomegaly, no distention.  Neurologic: Alert and oriented x 3.  Psych: Normal affect. Extremities: No clubbing or cyanosis.   TELEMETRY: Reviewed telemetry pt in NSR with BiV pacing    Assessment:   1. Acute respiratory failure: Suspect CHF + possible PNA 2. Acute on chronic systolic heart failure EF 30-35% 3. Acute bronchitis 4. CKD, stage III-IV 5. PAF in NSR on amio - CHDSVASC 5. On warfarin 6. Multifocal PNA  Plan/Discussion:  From HF perspective he is stable. Volume status low. Creatinine/BUN coming down.  Restart torsemide tomorrow 20 mg twice a day. Continue current dose of carvedilol, hydralazine, and imdur. No Ace with CKD.   Continue antibiotics per primary team.   Consult cardiac rehab.        CLEGG,AMY NP-C  07/25/2014 8:43 AM      Patient seen and examined with Tonye Becket, NP. We discussed all aspects of the encounter. I agree with the assessment and plan as stated above.   CT images reviewed he has severe multilobar PNA with mild overlying CHF. Continue abx. Consider asking Pulmonary to see. Will restart diuretics carefully with one dose of lasix 80 IV today. Can restart po diuretics soon.   Daniel Bensimhon,MD 10:55 AM

## 2014-07-25 NOTE — Consult Note (Signed)
Name: Timothy Durham MRN: [id number] DOB: [birth year]    ADMISSION DATE:  07/19/2014 CONSULTATION DATE:  4/29  REFERRING MD :  Erlinda Hong  CHIEF COMPLAINT:  Pneumonia not improving   BRIEF PATIENT DESCRIPTION:  81 year old male w/ known sig h/o systolic CM (EF 41-58%), CKD stage IV, prior AVR, and PAF. Admitted 4/23 w/ working dx of PNA w/ element of decompensated HF/pulmonary edema. Treated w/ broad spec abx and lasix until creatinine climbed and interrogation of optivol device on pacemaker suggested euvolemia on 4/27. 4/29 early evening suddenly more short of breath. CT chest obtained showing diffuse ground-glass opacities bilateral. PCCM asked to see re: "pneumonia not improving"   SIGNIFICANT EVENTS    STUDIES:  CT chest 4/29: diffuse patchy GG opacifications bilaterally, small bilateral pleural effusions.    HISTORY OF PRESENT ILLNESS:    81 yo male with a history of prior CVA in 2000 secondary to PAF, nonischemic cardimoyopathy s/p Medtronic CRT-P (30/94/07), chronic systolic HF (EF 68-08%), non-obstructive CAD, HTN, CKD stage IV, HLD, PAD and bioprosthetic AVR. Followed closely in HF clinic and recently doing quite well. Diuretics had to be cut back a bit due to hypotension. Weight has been stable around 154 pounds.   Admitted 4/24  With 4d h/o dry non-productive cough, worsening SOB with fevers and chills with WBC 12.2 and left shift. Weight stable. Had no sick exposures, no HA, nasal congestion, sore throat. Did note that he was putting together furniture from [place] the day prior to onset of his symptoms and had inhaled some dust or chemical that seemed to really irritate him. In the ER his Initial CXR with mild vascular congestion. Felt to have acute bronchitis. Treated w/ azithro, pred and lasix. On 4/26 had worsening hypoxia and progressive worsening on his CXR. His antibiotics were widened and lasix escalated. On 4/27 his pacemaker optivol device was interrogated and readings suggested  euvolemia, so lasix was held. He did not feel as though he had improved much. The following day on 4/28 his weight was up 3 lbs and creatinine had climbed. A CT of chest was obtained that continued to show diffuse bilateral ground glass opacifications. PCCM was asked to see to make recommendations re: further care in setting of limited radiographic improvement.   PAST MEDICAL HISTORY :   has a past medical history of Arthritis; Non-obstructive CAD; Hypertension; Stroke; CKD (chronic kidney disease), stage III; GERD (gastroesophageal reflux disease); Foot pain, bilateral; H/O hematuria; Hyperlipidemia; Vitamin D deficiency; Chronic combined systolic and diastolic CHF (congestive heart failure); PAF (paroxysmal atrial fibrillation); NICM (nonischemic cardiomyopathy); Aortic valve prosthesis present; PAD (peripheral artery disease); Carotid arterial disease; History of tobacco abuse; Moderate to Severe Mitral Regurgitation; Anxiety; COPD (chronic obstructive pulmonary disease); Coronary arteriosclerosis (Oct 2015); Shortness of breath dyspnea; Depression; Presence of permanent cardiac pacemaker; Heart murmur; and Dysrhythmia.  has past surgical history that includes Aortic valve replacement (avr)/coronary artery bypass grafting (cabg) (12/2008); Eye surgery; Carotid angiogram (1996); LEFT LOWER EXTREMITY STENT; Carotid endarterectomy; Insert / replace / remove pacemaker; Coronary angiogram (Oct 2015); Pacemaker insertion (Nov 2015); left and right heart catheterization with coronary angiogram (N/A, 01/14/2014); bi-ventricular pacemaker insertion (N/A, 02/17/2014); Coronary artery bypass graft; Coronary angioplasty; Tonsillectomy; and Cardiac valve replacement. Prior to Admission medications   Medication Sig Start Date End Date Taking? Authorizing Provider  acetaminophen (TYLENOL) 500 MG tablet Take 1,000 mg by mouth at bedtime.    Yes Historical Provider, MD  allopurinol (ZYLOPRIM) 100 MG tablet Take 200 mg by  mouth daily.   Yes Historical Provider, MD  amiodarone (PACERONE) 200 MG tablet Take 1 tablet (200 mg total) by mouth daily. 03/10/14  Yes Jolaine Artist, MD  atorvastatin (LIPITOR) 40 MG tablet Take 40 mg by mouth every evening.   Yes Historical Provider, MD  Calcium Carb-Cholecalciferol (CALCIUM 600 + D PO) Take 2 tablets by mouth every morning.   Yes Historical Provider, MD  carvedilol (COREG) 6.25 MG tablet Take 1 tablet (6.25 mg total) by mouth 2 (two) times daily. 07/08/14  Yes Minus Breeding, MD  clopidogrel (PLAVIX) 75 MG tablet Take 75 mg by mouth daily.   Yes Historical Provider, MD  DULoxetine (CYMBALTA) 60 MG capsule Take 60 mg by mouth daily.  03/18/14  Yes Historical Provider, MD  hydrALAZINE (APRESOLINE) 25 MG tablet Take 0.5 tablets (12.5 mg total) by mouth 2 (two) times daily. 07/07/14  Yes Larey Dresser, MD  isosorbide mononitrate (IMDUR) 30 MG 24 hr tablet Take 1 tablet (30 mg total) by mouth daily. 05/06/14  Yes Amy D Clegg, NP  metolazone (ZAROXOLYN) 5 MG tablet Take 1 tablet (5 mg total) by mouth as needed. FOR WEIGHT 163 LB OR GREATER Patient taking differently: Take 5 mg by mouth as needed (fluid).  04/10/14  Yes Jolaine Artist, MD  Omega-3 Fatty Acids (FISH OIL) 1200 MG CAPS Take 1 capsule by mouth every morning.   Yes Historical Provider, MD  omeprazole (PRILOSEC) 40 MG capsule Take 40 mg by mouth every morning.   Yes Historical Provider, MD  potassium chloride SA (K-DUR,KLOR-CON) 20 MEQ tablet Take 1 tablet (20 mEq total) by mouth as needed. TAKE ONLY WHEN YOU TAKE METOLAZONE 04/10/14  Yes Jolaine Artist, MD  spironolactone (ALDACTONE) 25 MG tablet Take 1 tablet (25 mg total) by mouth daily. 06/03/14  Yes Larey Dresser, MD  tamsulosin (FLOMAX) 0.4 MG CAPS capsule Take 1 capsule (0.4 mg total) by mouth at bedtime. 04/10/14  Yes Jolaine Artist, MD  torsemide (DEMADEX) 20 MG tablet Take 1 tablet (20 mg total) by mouth 2 (two) times daily. Take 40 mg in am and 20 mg  in pm 05/30/14  Yes Amy D Clegg, NP  warfarin (COUMADIN) 4 MG tablet Take 2 mg by mouth daily at 6 PM.  02/24/13  Yes Historical Provider, MD   Allergies  Allergen Reactions  . Ambien [Zolpidem] Other (See Comments)    Hallucinations  . Ativan [Lorazepam] Other (See Comments)    hallucinations  . Oxycontin [Oxycodone] Other (See Comments)    hallucinations  . Penicillins     Unknown childhood reaction   . Sulfa Antibiotics     unknown    FAMILY HISTORY:  family history includes Cancer in his father; Stroke in his mother. SOCIAL HISTORY:  reports that he quit smoking about 15 years ago. His smoking use included Cigarettes. He has a 70 pack-year smoking history. He has never used smokeless tobacco. He reports that he drinks about 0.6 oz of alcohol per week. He reports that he does not use illicit drugs.  REVIEW OF SYSTEMS:   Constitutional: Negative for further fevers or chills, weight loss, malaise/+fatigue and diaphoresis.  HENT: Negative for hearing loss, ear pain, nosebleeds, congestion, sore throat, neck pain, tinnitus and ear discharge.   Eyes: Negative for blurred vision, double vision, photophobia, pain, discharge and redness.  Respiratory:+cough, now some blood tinged,  shortness of breath, worse last night, had not really ever improved to baseline since admit, wheezing and stridor.  Cardiovascular: Negative for chest pain, palpitations, orthopnea, claudication, leg swelling, over last 24hrs and PND.  Gastrointestinal: Negative for heartburn, nausea, vomiting, abdominal pain, diarrhea, constipation, blood in stool and melena.  Genitourinary: Negative for dysuria, urgency, frequency, hematuria and flank pain.  Musculoskeletal: Negative for myalgias, back pain, joint pain and falls.  Skin: Negative for itching and rash.  Neurological: Negative for dizziness, tingling, tremors, sensory change, speech change, focal weakness, seizures, loss of consciousness, weakness and headaches.    Endo/Heme/Allergies: Negative for environmental allergies and polydipsia. Does not bruise/bleed easily.  SUBJECTIVE:  Very little improvement  VITAL SIGNS: Temp:  [97.4 F (36.3 C)-97.7 F (36.5 C)] 97.6 F (36.4 C) (04/29 1033) Pulse Rate:  [86-97] 86 (04/29 1033) Resp:  [18-20] 20 (04/29 1033) BP: (120-136)/(44-56) 120/44 mmHg (04/29 1033) SpO2:  [88 %-98 %] 95 % (04/29 1033) Weight:  [70.4 kg (155 lb 3.3 oz)] 70.4 kg (155 lb 3.3 oz) (04/29 0800)  PHYSICAL EXAMINATION: General:  81 year old male sitting in chair. No distress.  Neuro:  Alert, oriented, no focal def  HEENT:  CN, no JVD  Cardiovascular:  Reg irreg, IV/VI SEM Lungs:  Diffuse rales, no accessory muscle use  Abdomen:  Soft, non-tender + bowel sounds  Musculoskeletal:  Intact  Skin:  Trace ankle edema    Recent Labs Lab 07/23/14 0455 07/24/14 0340 07/25/14 0431  NA 132* 133* 132*  K 4.0 4.8 4.5  CL 93* 95* 96  CO2 26 23 24   BUN 62* 69* 68*  CREATININE 1.86* 2.15* 2.02*  GLUCOSE 113* 108* 104*    Recent Labs Lab 07/20/14 0028 07/24/14 0340 07/25/14 0431  HGB 10.2* 10.1* 9.8*  HCT 29.9* 28.9* 27.6*  WBC 12.2* 16.5* 16.7*  PLT 242 340 325   Ct Chest Wo Contrast  07/25/2014   CLINICAL DATA:  Hypoxia and progressive shortness of breath for the past 3 days. Nonproductive cough and wheeze  EXAM: CT CHEST WITHOUT CONTRAST  TECHNIQUE: Multidetector CT imaging of the chest was performed following the standard protocol without IV contrast.  COMPARISON:  None.  FINDINGS: THORACIC INLET/BODY WALL:  There is a biventricular pacer from the left with leads in unremarkable position. No acute findings at the thoracic inlet.  There is an incidental 1 cm intramuscular lipoma in the right chest wall.  MEDIASTINUM:  Cardiomegaly which is chronic based on chest x-rays. No pericardial effusion. The patient is status post median sternotomy with aortic valve replacement. The ascending aorta measures 41 mm in diameter. There is  mild enlargement of mediastinal lymph nodes which could be reactive to the presumed pneumonia or sequela of remote granulomatous disease (there is also scattered nodal coarse calcification). No acute vascular findings. Negative esophagus.  LUNG WINDOWS:  There is patchy consolidative and ground-glass opacities in all lobes. Airspace disease is most extensive in the left upper lobe and most dense in the posterior segment right upper lobe. Small bilateral pleural effusions with interlobular septal thickening. No cavitation. No evidence of loculated effusion.  UPPER ABDOMEN:  Granulomatous changes in the liver and spleen.  OSSEOUS:  No acute findings.  IMPRESSION: 1. Multilobar pneumonia. 2. Superimposed interstitial edema and small pleural effusions. 3. Chronic and postoperative findings noted above.   Electronically Signed   By: Monte Fantasia M.D.   On: 07/25/2014 08:00    ASSESSMENT / PLAN:  Acute hypoxic respiratory failure in setting of diffuse bilateral infiltrates.. Culture data to date has been non-diagnostic. Had swallow eval and did not show aspiration.  Suspect that a component of this is edema, but don't think that this is worsening pneumonia (he does not appear toxic at all) Need to consider alternative ddx such as viral PNA/pneumonitis, hypersensitivity pneumonitis  (from fumes/dust from furniture) or drug induced pneumonitis as has not had normal chest xray dating back for over a year. Nothing in his hx suggests auto-immune etiology.  Plan Ck Urine strep  Send resp viral panel Ck procalcitonin Agree w/ re-introduction of diuresis Ck ESR Will talk w/ Dr Nelda Marseille, we will need to decide on timing of steroid trial No change in abx for now.  Don't think he is a good bronchoscopy candidate  Will need to decide of amiodarone is a good long term drug for him   NICM (EF 30-35%), CKD stage III, h/o CVA, atrial fib, and GERD Plan Per cardiac and internal medicine teams.    Erick Colace  ACNP-BC [place] Pager # [phone] OR # [phone] if no answer  81 year old DNR patient with PNA and hypoxemic respiratory failure.  CT findings more consistent with a pneumonitis.  However, can not ignore the fact that WBC is elevated.  Patient does not appear toxic enough to be concerned for none improving pneumonia at this time however.  I am concerned for pulmonary edema in addition to current condition.  I reviewed chest CT myself, pattern is more consistent with a pneumonitis.    Hypoxemic respiratory failure: concern for PNA vs pneumonitis.  - Supplemental O2.  - DNR status confirmed.  Pulmonary infiltrate: not sure if this is all pneumonia.  He has an exposure history to dust and furniture paint.  - Recommend broadening the abx spectrum.  - Trial of steroids with solumedrol 40 mg IV q8 x3 days then reevaluate imaging via a CXR not CT.  Pulmonary edema: pattern on CXR can not be ignored.  - Lasix 40 mg IV q8 x2 doses then evaluate O2 demand.  - 2D echo for EF and PAP.  Pneumonia: imaging and O2 demand not improving.  - Broaden abx spectrum by adding vanc/zosyn  - Repeat cultures.  - Never bronch, DNR and increased O2 demand, will likely end up on the ventilator if conscious sedation is used.  - Recommend calling ID service.  Patient seen and examined, agree with above note.  I dictated the care and orders written for this patient under my direction.  Rush Farmer, MD [phone]  07/25/2014, 11:28 AM

## 2014-07-25 NOTE — Progress Notes (Signed)
Pt woke up having trouble breathing checked sats they were 85, went up to 88, gave Albuterol breathing tx went to 95 was on 4L changed to 5 to keep sats at 95 and up, will conitue to ,monitor Thanks Lavonda Jumbo RN

## 2014-07-25 NOTE — Progress Notes (Signed)
[facility] PROGRESS NOTE  Timothy Durham UJW:[id number] DOB: 1930/08/14 DOA: 07/19/2014 PCP: Dennis Bast, MD   brief narrative 81 year old male with history of nonischemic cardiomyopathy with EF of 30-35% as per last echo, NST EMI in 2015 with cardiac cath showing nonobstructive CAD, chronic kidney disease stage III, history of stroke in 2000, history of bioprosthetic aVR, paroxysmal A. fib on chronic Coumadin, tobacco use with? COPD who presented with progressive shortness of breath for the past 3 days associated with nonproductive cough and wheezing. She reports being compliant with these medications. Chest x-ray on admission showing moderate vascular congestion. Labs showed elevated BNP of 1400. Patient admitted for acute hypoxic respiratory failure due to acute bronchitis. During the hospital stay he became increasingly short of breath and wheezy with follow-up chest x-ray showing multifocal pneumonia and volume overload.   Assessment/Plan: Acute hypoxic respiratory failure Initially admitted for acute bronchitis but during the hospital course became increasingly short of breath with multifocal pneumonia on chest x-ray.  broaden antibiotic coverage with empiric vancomycin and cefepime for HCAP  passed swallow eval on 4/27 with regular diet and thin liquid. Continue O2 via nasal cannula and scheduled duonebs. Continue  oral prednisone. Taper as able -reported worsening of sob, chest CT ordered. Persistent patchy consolidative and ground glass opacities in all lobes, increased oxygen requirement, now on 5liters. Sputum sample not representative for lower airway sample, will attempt induced sputum, consult pulmology and ID.  Nonischemic cardiomyopathy with acute exacerbation. - euvolemic on admission but in volume overloaded overnight triggered by underlying pneumonia.. Continue Plavix, Coreg hydralazine, imdur and statin..  Treated with IV Lasix  And oral Aldactone. D/ced due to cr  elevation. Monitor strict I/O and daily weight Not on ACEi/ ARB due to CKD Heart failure team consulted  Acute on chronic kidney disease stage III Baseline creatinine around 1.8-2.  Cr worsening, diuretic held. [place] abd shows medical renal ds. Repeat ua no infection. Avoid renal toxin. Renal dosing meds.   History of CVA and peripheral vascular disease Continue Plavix and statin.  Atrial fibrillation Pace rhythm, Rate controlled. Continue amiodarone and Coumadin. Dosing per pharmacy. May need to d/c amiodarone due to pulmonary toxicity, to be determined by cardiology.  GERD Continue PPI  DVT prophylaxis: On Coumadin Diet: Heart healthy  Code Status: DO NOT RESUSCITATE  Family Communication: discussed in detail with patient /wife  Disposition Plan: Monitor on telemetry. Home vs rehab   Consultants:  Heart failure team  pccm  ID  Procedures:  None  Antibiotics:  Z-Pak  Vanc/cefepime from 4/24  HPI/Subjective:  Sitting in chair, on 5liter, patient reported sob, productive cough, denies chest pain.   Objective: Filed Vitals:   07/25/14 1402  BP: 122/47  Pulse: 78  Temp: 97.8 F (36.6 C)  Resp: 20    Intake/Output Summary (Last 24 hours) at 07/25/14 1638 Last data filed at 07/25/14 1435  Gross per 24 hour  Intake   1120 ml  Output   1775 ml  Net   -655 ml   Filed Weights   07/23/14 0544 07/24/14 0516 07/25/14 0800  Weight: 68.493 kg (151 lb) 69.854 kg (154 lb) 70.4 kg (155 lb 3.3 oz)    Exam:   General:   Elderly male in NAD  HEENT:  JVD+, supple neck, moist oral mucosa  Chest: Scattered rhonchi bilaterally, Left basilar crackles, mild faint wheezing   CVS: S1 and S2 is irregularly irregular, no murmurs rub or gallop  GI: Soft, nondistended, nontender, BS+  Musculoskeletal: Warm,  no edema  Cns: Alert and oriented     Data Reviewed: Basic Metabolic Panel:  Recent Labs Lab 07/20/14 0028 07/21/14 0500 07/23/14 0455  07/24/14 0340 07/25/14 0431  NA 131* 131* 132* 133* 132*  K 4.0 3.6 4.0 4.8 4.5  CL 92* 93* 93* 95* 96  CO2 25 25 26 23 24   GLUCOSE 206* 124* 113* 108* 104*  BUN 70* 57* 62* 69* 68*  CREATININE 2.65* 1.94* 1.86* 2.15* 2.02*  CALCIUM 8.4 8.4 8.5 8.6 8.8  MG  --   --   --  2.2  --    Liver Function Tests:  Recent Labs Lab 07/20/14 0028  AST 31  ALT 23  ALKPHOS 92  BILITOT 1.2  PROT 6.2  ALBUMIN 2.9*   No results for input(s): LIPASE, AMYLASE in the last 168 hours. No results for input(s): AMMONIA in the last 168 hours. CBC:  Recent Labs Lab 07/20/14 0028 07/24/14 0340 07/25/14 0431  WBC 12.2* 16.5* 16.7*  NEUTROABS 11.3*  --   --   HGB 10.2* 10.1* 9.8*  HCT 29.9* 28.9* 27.6*  MCV 94.9 90.3 88.5  PLT 242 340 325   Cardiac Enzymes: No results for input(s): CKTOTAL, CKMB, CKMBINDEX, TROPONINI in the last 168 hours. BNP (last 3 results)  Recent Labs  05/04/14 1700 07/20/14 0028  BNP 1013.0* 1469.3*    ProBNP (last 3 results)  Recent Labs  02/24/14 1606 02/26/14 1623 03/06/14 1129  PROBNP 2973.0* 18948.0* 5943.0*    CBG: No results for input(s): GLUCAP in the last 168 hours.  Recent Results (from the past 240 hour(s))  Culture, blood (routine x 2)     Status: None (Preliminary result)   Collection Time: 07/20/14  2:10 AM  Result Value Ref Range Status   Specimen Description BLOOD RIGHT FOREARM  Final   Special Requests BOTTLES DRAWN AEROBIC AND ANAEROBIC 5CC EA  Final   Culture   Final           BLOOD CULTURE RECEIVED NO GROWTH TO DATE CULTURE WILL BE HELD FOR 5 DAYS BEFORE ISSUING A FINAL NEGATIVE REPORT Performed at [facility]    Report Status PENDING  Incomplete  Culture, blood (routine x 2)     Status: None (Preliminary result)   Collection Time: 07/20/14  2:19 AM  Result Value Ref Range Status   Specimen Description BLOOD LEFT FOREARM  Final   Special Requests BOTTLES DRAWN AEROBIC ONLY 4CC  Final   Culture   Final            BLOOD CULTURE RECEIVED NO GROWTH TO DATE CULTURE WILL BE HELD FOR 5 DAYS BEFORE ISSUING A FINAL NEGATIVE REPORT Performed at [facility]    Report Status PENDING  Incomplete  MRSA PCR Screening     Status: None   Collection Time: 07/20/14  4:45 AM  Result Value Ref Range Status   MRSA by PCR NEGATIVE NEGATIVE Final    Comment:        The GeneXpert MRSA Assay (FDA approved for NASAL specimens only), is one component of a comprehensive MRSA colonization surveillance program. It is not intended to diagnose MRSA infection nor to guide or monitor treatment for MRSA infections.   Culture, expectorated sputum-assessment     Status: None   Collection Time: 07/23/14 11:23 PM  Result Value Ref Range Status   Specimen Description SPUTUM  Final   Special Requests NONE  Final   Sputum evaluation   Final  MICROSCOPIC FINDINGS SUGGEST THAT THIS SPECIMEN IS NOT REPRESENTATIVE OF LOWER RESPIRATORY SECRETIONS. PLEASE RECOLLECT. RESULT CALLED TO, READ BACK BY AND VERIFIED WITH: NOTIFIED G RUSUL,RN 07/24/14 AT 0040 BY RHOLMES   Report Status 07/24/2014 FINAL  Final  Culture, expectorated sputum-assessment     Status: None   Collection Time: 07/24/14  9:14 PM  Result Value Ref Range Status   Specimen Description SPUTUM  Final   Special Requests Normal  Final   Sputum evaluation   Final    MICROSCOPIC FINDINGS SUGGEST THAT THIS SPECIMEN IS NOT REPRESENTATIVE OF LOWER RESPIRATORY SECRETIONS. PLEASE RECOLLECT. CALLED TO D.HART ,RN AT 0124 BY L.PITT 07/25/14    Report Status 07/25/2014 FINAL  Final     Studies: Ct Chest Wo Contrast  07/25/2014   CLINICAL DATA:  Hypoxia and progressive shortness of breath for the past 3 days. Nonproductive cough and wheeze  EXAM: CT CHEST WITHOUT CONTRAST  TECHNIQUE: Multidetector CT imaging of the chest was performed following the standard protocol without IV contrast.  COMPARISON:  None.  FINDINGS: THORACIC INLET/BODY WALL:  There is a biventricular pacer  from the left with leads in unremarkable position. No acute findings at the thoracic inlet.  There is an incidental 1 cm intramuscular lipoma in the right chest wall.  MEDIASTINUM:  Cardiomegaly which is chronic based on chest x-rays. No pericardial effusion. The patient is status post median sternotomy with aortic valve replacement. The ascending aorta measures 41 mm in diameter. There is mild enlargement of mediastinal lymph nodes which could be reactive to the presumed pneumonia or sequela of remote granulomatous disease (there is also scattered nodal coarse calcification). No acute vascular findings. Negative esophagus.  LUNG WINDOWS:  There is patchy consolidative and ground-glass opacities in all lobes. Airspace disease is most extensive in the left upper lobe and most dense in the posterior segment right upper lobe. Small bilateral pleural effusions with interlobular septal thickening. No cavitation. No evidence of loculated effusion.  UPPER ABDOMEN:  Granulomatous changes in the liver and spleen.  OSSEOUS:  No acute findings.  IMPRESSION: 1. Multilobar pneumonia. 2. Superimposed interstitial edema and small pleural effusions. 3. Chronic and postoperative findings noted above.   Electronically Signed   By: Marnee Spring M.D.   On: 07/25/2014 08:00    Scheduled Meds: . allopurinol  200 mg Oral Daily  . amiodarone  200 mg Oral Daily  . atorvastatin  40 mg Oral QPM  . azithromycin  250 mg Oral Daily  . carvedilol  6.25 mg Oral BID WC  . clopidogrel  75 mg Oral Daily  . DULoxetine  60 mg Oral Daily  . hydrALAZINE  12.5 mg Oral BID  . ipratropium-albuterol  3 mL Nebulization TID  . isosorbide mononitrate  30 mg Oral Daily  . pantoprazole  80 mg Oral Daily  . predniSONE  20 mg Oral Q breakfast  . sodium chloride  3 mL Intravenous Q12H  . tamsulosin  0.4 mg Oral QHS  . warfarin  2 mg Oral q1800  . Warfarin - Pharmacist Dosing Inpatient   Does not apply q1800   Continuous Infusions:      Time spent:35 minutes    Timothy Durham  [facility] Pager [phone]. If 7PM-7AM, please contact night-coverage at [email or web address], password [facility] 07/25/2014, 4:38 PM  LOS: 5 days

## 2014-07-25 NOTE — Progress Notes (Addendum)
Anticoagulation/Antibiotic CONSULT NOTE  Pharmacy Consult for Coumadin Indication: atrial fibrillation  Allergies  Allergen Reactions  . Ambien [Zolpidem] Other (See Comments)    Hallucinations  . Ativan [Lorazepam] Other (See Comments)    hallucinations  . Oxycontin [Oxycodone] Other (See Comments)    hallucinations  . Penicillins     Unknown childhood reaction   . Sulfa Antibiotics     unknown    Patient Measurements: Height: 5\' 10"  (177.8 cm) Weight: 155 lb 3.3 oz (70.4 kg) (bed scale) IBW/kg (Calculated) : 73  Vital Signs: Temp: 97.6 F (36.4 C) (04/29 1033) Temp Source: Axillary (04/29 1033) BP: 120/44 mmHg (04/29 1033) Pulse Rate: 86 (04/29 1033)  Labs:  Recent Labs  07/23/14 0455 07/24/14 0340 07/25/14 0431  HGB  --  10.1* 9.8*  HCT  --  28.9* 27.6*  PLT  --  340 325  LABPROT 24.3* 25.2* 25.8*  INR 2.16* 2.27* 2.34*  CREATININE 1.86* 2.15* 2.02*    Estimated Creatinine Clearance: 28.6 mL/min (by C-G formula based on Cr of 2.02).  Assessment: 81yo male to continue on pta coumadin for PAF (last pta dose 4/23). INR 2.99 on admit Also on amio po pta. Warfarin held on 4/24.  INR down to 2.3 this morning. CBC stable- no bleeding noted.  PTA warfarin dose: 2mg  daily  ID: was on azithro for COPD exacerbation. CT shows severe pneumonia, he continues on  vanc/cefepime for HCAP. Afeb, wbc 16 this am (stable). Scr fairly stable at 2.0. ID has been consulted.   Will check vancomycin level with next dose tomorrow.  Azith 4/25>>4/26 CTX 4/26 x1 Vanc 4/26>> Cefepime 4/26>>  4/24 BCx2: NGTD 4/28 Resp cx - recollect  Goal of Therapy:  INR 2-3   Plan:  - Vanc level in am - continue cefepime 1g q24h -Continue home dose of warfarin 2mg  -Monitor daily PT/INR  -follow for s/sx bleeding  Sheppard Coil PharmD., BCPS Clinical Pharmacist Pager [phone] 07/25/2014 10:56 AM

## 2014-07-25 NOTE — Progress Notes (Addendum)
Respiratory virus collected and sent to lab.  Pt tolerated well.  Amanda Pea, Charity fundraiser.

## 2014-07-25 NOTE — Progress Notes (Signed)
RT Note: RT came to see about inducing a sputum on the patient. He had already expectorated some sputum and had a very productive cough. RN was notified and she is sending the sample down to lab. RT will continue to monitor.

## 2014-07-25 NOTE — Progress Notes (Signed)
Physical Therapy Treatment Patient Details Name: Timothy Durham MRN: [id number] DOB: October 13, 1930 Today's Date: 07/25/2014    History of Present Illness 81 y.o. male admitted with Acute hypoxic respiratory failure, and Nonischemic cardiomyopathy with acute exacerbation.    PT Comments    Pt admitted with above diagnosis. Pt currently with functional limitations due to very poor endurance.  Pt limited today due to O2 needs incr overnight from 3 to 5L.  O2 sats on arrival were 96-98% on 5LO2.  Ecucated wife how to check sats when pt going to bathroom as he was winded earlier when he went to bathroom with her assist.  Pt did not feel like ambulating right now.  Will continue PT as pt able.  Pt will benefit from skilled PT to increase their independence and safety with mobility to allow discharge to the venue listed below.    Follow Up Recommendations  Home health PT;Supervision/Assistance - 24 hour     Equipment Recommendations  Rolling walker with 5" wheels (may need tub bench vs 3-in-1 - TBD)    Recommendations for Other Services       Precautions / Restrictions Precautions Precautions: Fall Restrictions Weight Bearing Restrictions: No    Mobility  Bed Mobility               General bed mobility comments: sitting in chair  Transfers                    Ambulation/Gait                 Stairs            Wheelchair Mobility    Modified Rankin (Stroke Patients Only)       Balance                                    Cognition Arousal/Alertness: Awake/alert Behavior During Therapy: WFL for tasks assessed/performed Overall Cognitive Status: Within Functional Limits for tasks assessed                      Exercises      General Comments General comments (skin integrity, edema, etc.): SpO2 at rest 96% on 5LO2.  Pt's wife states that she has been ambulating with pt to and from the bathroom all am and that pt got winded but  that wife did not check O2 sat so she did not know what it was but that he was very winded.  Showed pt's wife how to check O2 sat for next trip to bathroom.  Instructed her to let the nurse know if it dropped with activity.      Pertinent Vitals/Pain Pain Assessment: No/denies pain  O2 96-98% with 5LO2    Home Living                      Prior Function            PT Goals (current goals can now be found in the care plan section) Progress towards PT goals: Not progressing toward goals - comment (O2 had to be incr this am from 3L to 5L.)    Frequency  Min 3X/week    PT Plan Current plan remains appropriate    Co-evaluation             End of Session Equipment Utilized During Treatment: Gait belt;Oxygen (5L) Activity Tolerance:  Patient limited by fatigue Patient left: in chair;with call bell/phone within reach;with family/visitor present     Time: 1157-1205 PT Time Calculation (min) (ACUTE ONLY): 8 min  Charges:  $Self Care/Home Management: 8-22                    G CodesBerline Lopes 08-21-14, 2:04 PM Johnathyn Viscomi,PT Acute Rehabilitation [phone] (pager)

## 2014-07-25 NOTE — Consult Note (Signed)
[facility]     Reason for Consult:Atypical PNA not responsive to broad spectrum Abx    Referring Physician: Dr. Roda Shutters  Principal Problem:   Acute respiratory failure Active Problems:   Chronic systolic heart failure   Atrial fibrillation   Acute renal failure superimposed on stage 3 chronic kidney disease   COPD exacerbation   Hypoxemia   Pneumonia, organism unspecified   Purulent bronchitis   . allopurinol  200 mg Oral Daily  . amiodarone  200 mg Oral Daily  . atorvastatin  40 mg Oral QPM  . carvedilol  6.25 mg Oral BID WC  . ceFEPime (MAXIPIME) IV  1 g Intravenous Q24H  . clopidogrel  75 mg Oral Daily  . DULoxetine  60 mg Oral Daily  . hydrALAZINE  12.5 mg Oral BID  . ipratropium-albuterol  3 mL Nebulization TID  . isosorbide mononitrate  30 mg Oral Daily  . pantoprazole  80 mg Oral Daily  . predniSONE  20 mg Oral Q breakfast  . sodium chloride  3 mL Intravenous Q12H  . tamsulosin  0.4 mg Oral QHS  . vancomycin  1,000 mg Intravenous Q24H  . warfarin  2 mg Oral q1800  . Warfarin - Pharmacist Dosing Inpatient   Does not apply q1800    Recommendations: - D/C Vanc and Cefepime (do not suspect HAP) - Can continue Azithromycin for possible atypical organisms - Agree with pulmonology workup for non infectious, may need bronch - Check HIV   Assessment: Acute hypoxic respiratory failure Bilateral infiltrates on CT of Chest Leukocytosis (likely secondary to steroids) Hemoptysis sCHF CKD 3/4 A fib (A/C with coumadin) Bioprosthetic AVR   Antibiotics: IV Vanc 4/26>>> IV Cefepime 4/26>>> Azythromycin 4/23, 4/25  HPI: Timothy Durham is a 81 y.o. male with an extensive PMH documented below but significant for systolic CHF (EF 29-56%), PAF on coumadin, CKD Stage 3/4, HTN, CAD.  Who presented to [facility] on 4/24 with a 4 day history of non productive cough, worsening SOB with fever and chills and a mildly elevated WBC count.  He apparently put together  some furniture on the day prior to his symptoms starting.  In the ED an CXR was obtained that should improved mild vascular congestion.  He was felt to be clinically euvolemic to hypovolemic and was initially treated for acute bronchitis with azithromycin, prednisone.  Two days later he had worsening hypoxia and a repeat CXR showed some worsening opacification and small pleural effusions. He was started on Vancomycin and Cefepime for HAP.  Heart failure was also consulted and felt he was volume overloaded so he was started on IV diuretics until his pacer was interragated and suggested euvolemia at which point lasix was held.  He has since had some weight gain and lasix has been resumed. Pulmonology has also been consulted today.   He reports that really he feels no better than when he came to the hospital.  He denies any sick contacts at home.  Lives with his wife.  Only trip out of the house in the past few weeks was to [facility].  Social history: Born in [place], has lived in [place], [place], [place], then in [place] for the last 4 years.  Worked in Community education officer mostly, had occasional hobby of woodwork (built birdhouses) but has not done in many years.  Travel outside of [place] only to [place] and [place], [place] (last out of country 10 years ago. Has children and grandchildren that live in [place],  has not seen them recently.   Review of Systems: Review of Systems  Constitutional: Positive for chills and malaise/fatigue. Negative for fever, weight loss and diaphoresis.  Eyes: Negative for blurred vision and photophobia.  Respiratory: Positive for cough, hemoptysis, sputum production and shortness of breath. Negative for wheezing.   Cardiovascular: Positive for leg swelling. Negative for chest pain.  Gastrointestinal: Negative for heartburn and abdominal pain.  Genitourinary: Negative for dysuria and frequency.  Musculoskeletal: Negative for myalgias, back pain and joint pain.  Neurological: Negative for dizziness, focal weakness  and headaches.     Past Medical History  Diagnosis Date  . Arthritis   . Non-obstructive CAD     a. 12/2013 NSTEMI/Cath: LM nl, LAD 20, LCX 40-50, RCA 20.  Marland Kitchen Hypertension   . Stroke     a. July 2000;  b. January 2007;  c. TIA in 2014.  . CKD (chronic kidney disease), stage III   . GERD (gastroesophageal reflux disease)   . Foot pain, bilateral   . H/O hematuria   . Hyperlipidemia   . Vitamin D deficiency   . Chronic combined systolic and diastolic CHF (congestive heart failure)     a. 12/2013 Echo: EF 30-35%, mod conc LVH, Gr 3 DD, mild AI, mod-sev MR, mod dil LA, mild TR.  Marland Kitchen PAF (paroxysmal atrial fibrillation)     a. CHA2DS2VASc = 7--> Chronic Coumadin.  Marland Kitchen NICM (nonischemic cardiomyopathy)     a. 12/2013 Echo: EF 30-35%.  . Aortic valve prosthesis present     a. 2006: S/P bioprosthetic AVR.  Marland Kitchen PAD (peripheral artery disease)     a. s/p LLE stenting.  . Carotid arterial disease     a. s/p R CEA  . History of tobacco abuse   . Moderate to Severe Mitral Regurgitation     a. 12/2013 Echo: Mod-Sev MR.  Marland Kitchen Anxiety   . COPD (chronic obstructive pulmonary disease)   . Coronary arteriosclerosis Oct 2015    med Rx  . Shortness of breath dyspnea   . Depression   . Presence of permanent cardiac pacemaker   . Heart murmur   . Dysrhythmia     History  Substance Use Topics  . Smoking status: Former Smoker -- 2.00 packs/day for 35 years    Types: Cigarettes    Quit date: 09/26/1998  . Smokeless tobacco: Never Used  . Alcohol Use: 0.6 oz/week    1 Cans of beer per week     Comment: drinks one can of beer or wine daily.    Family History  Problem Relation Age of Onset  . Stroke Mother   . Cancer Father    Allergies  Allergen Reactions  . Ambien [Zolpidem] Other (See Comments)    Hallucinations  . Ativan [Lorazepam] Other (See Comments)    hallucinations  . Oxycontin [Oxycodone] Other (See Comments)    hallucinations  . Penicillins     Unknown childhood reaction   .  Sulfa Antibiotics     unknown    OBJECTIVE: Blood pressure 120/44, pulse 86, temperature 97.6 F (36.4 C), temperature source Axillary, resp. rate 20, height  (1.778 m), weight 155 lb 3.3 oz (70.4 kg), SpO2 95 %. General: elderly male sitting in chair in NAD on supplemental O2 via [place] Skin: warm dry Lungs: scattered rales no wheezing Cor: irregular +systolic murmur Abdomen: soft non tender + BS   Microbiology: Recent Results (from the past 240 hour(s))  Culture, blood (routine x 2)  Status: None (Preliminary result)   Collection Time: 07/20/14  2:10 AM  Result Value Ref Range Status   Specimen Description BLOOD RIGHT FOREARM  Final   Special Requests BOTTLES DRAWN AEROBIC AND ANAEROBIC 5CC EA  Final   Culture   Final           BLOOD CULTURE RECEIVED NO GROWTH TO DATE CULTURE WILL BE HELD FOR 5 DAYS BEFORE ISSUING A FINAL NEGATIVE REPORT Performed at [facility]    Report Status PENDING  Incomplete  Culture, blood (routine x 2)     Status: None (Preliminary result)   Collection Time: 07/20/14  2:19 AM  Result Value Ref Range Status   Specimen Description BLOOD LEFT FOREARM  Final   Special Requests BOTTLES DRAWN AEROBIC ONLY 4CC  Final   Culture   Final           BLOOD CULTURE RECEIVED NO GROWTH TO DATE CULTURE WILL BE HELD FOR 5 DAYS BEFORE ISSUING A FINAL NEGATIVE REPORT Performed at [facility]    Report Status PENDING  Incomplete  MRSA PCR Screening     Status: None   Collection Time: 07/20/14  4:45 AM  Result Value Ref Range Status   MRSA by PCR NEGATIVE NEGATIVE Final    Comment:        The GeneXpert MRSA Assay (FDA approved for NASAL specimens only), is one component of a comprehensive MRSA colonization surveillance program. It is not intended to diagnose MRSA infection nor to guide or monitor treatment for MRSA infections.   Culture, expectorated sputum-assessment     Status: None   Collection Time: 07/23/14 11:23 PM  Result Value  Ref Range Status   Specimen Description SPUTUM  Final   Special Requests NONE  Final   Sputum evaluation   Final    MICROSCOPIC FINDINGS SUGGEST THAT THIS SPECIMEN IS NOT REPRESENTATIVE OF LOWER RESPIRATORY SECRETIONS. PLEASE RECOLLECT. RESULT CALLED TO, READ BACK BY AND VERIFIED WITH: NOTIFIED G RUSUL,RN 07/24/14 AT 0040 BY RHOLMES   Report Status 07/24/2014 FINAL  Final  Culture, expectorated sputum-assessment     Status: None   Collection Time: 07/24/14  9:14 PM  Result Value Ref Range Status   Specimen Description SPUTUM  Final   Special Requests Normal  Final   Sputum evaluation   Final    MICROSCOPIC FINDINGS SUGGEST THAT THIS SPECIMEN IS NOT REPRESENTATIVE OF LOWER RESPIRATORY SECRETIONS. PLEASE RECOLLECT. CALLED TO D.HART ,RN AT 0124 BY L.PITT 07/25/14    Report Status 07/25/2014 FINAL  Final    Gust Rung, DO IMTS PGY-2 07/25/2014, 1:40 PM

## 2014-07-26 LAB — CBC
HCT: 26.2 % — ABNORMAL LOW (ref 39.0–52.0)
Hemoglobin: 9.4 g/dL — ABNORMAL LOW (ref 13.0–17.0)
MCH: 31.5 pg (ref 26.0–34.0)
MCHC: 35.9 g/dL (ref 30.0–36.0)
MCV: 87.9 fL (ref 78.0–100.0)
PLATELETS: 340 10*3/uL (ref 150–400)
RBC: 2.98 MIL/uL — ABNORMAL LOW (ref 4.22–5.81)
RDW: 16 % — AB (ref 11.5–15.5)
WBC: 19.8 10*3/uL — AB (ref 4.0–10.5)

## 2014-07-26 LAB — BASIC METABOLIC PANEL
Anion gap: 13 (ref 5–15)
BUN: 87 mg/dL — AB (ref 6–23)
CALCIUM: 9 mg/dL (ref 8.4–10.5)
CHLORIDE: 97 mmol/L (ref 96–112)
CO2: 23 mmol/L (ref 19–32)
CREATININE: 2.21 mg/dL — AB (ref 0.50–1.35)
GFR calc Af Amer: 30 mL/min — ABNORMAL LOW (ref >90)
GFR calc non Af Amer: 26 mL/min — ABNORMAL LOW (ref >90)
Glucose, Bld: 92 mg/dL (ref 70–99)
Potassium: 4.7 mmol/L (ref 3.5–5.1)
Sodium: 133 mmol/L — ABNORMAL LOW (ref 135–145)

## 2014-07-26 LAB — HEPATIC FUNCTION PANEL
ALBUMIN: 2.2 g/dL — AB (ref 3.5–5.2)
ALT: 51 U/L (ref 0–53)
AST: 42 U/L — AB (ref 0–37)
Alkaline Phosphatase: 130 U/L — ABNORMAL HIGH (ref 39–117)
Bilirubin, Direct: 0.5 mg/dL (ref 0.0–0.5)
Indirect Bilirubin: 1.3 mg/dL — ABNORMAL HIGH (ref 0.3–0.9)
Total Bilirubin: 1.8 mg/dL — ABNORMAL HIGH (ref 0.3–1.2)
Total Protein: 6.2 g/dL (ref 6.0–8.3)

## 2014-07-26 LAB — PROTIME-INR
INR: 2.33 — ABNORMAL HIGH (ref 0.00–1.49)
Prothrombin Time: 25.7 seconds — ABNORMAL HIGH (ref 11.6–15.2)

## 2014-07-26 LAB — CULTURE, BLOOD (ROUTINE X 2)
CULTURE: NO GROWTH
Culture: NO GROWTH

## 2014-07-26 LAB — HIV ANTIBODY (ROUTINE TESTING W REFLEX): HIV Screen 4th Generation wRfx: NONREACTIVE

## 2014-07-26 MED ORDER — PRO-STAT SUGAR FREE PO LIQD
30.0000 mL | Freq: Two times a day (BID) | ORAL | Status: DC
  Administered 2014-07-27: 30 mL via ORAL
  Filled 2014-07-26 (×11): qty 30

## 2014-07-26 NOTE — Progress Notes (Signed)
INITIAL NUTRITION ASSESSMENT  DOCUMENTATION CODES Per approved criteria  -Non-severe (moderate) malnutrition in the context of chronic illness   INTERVENTION: -Reccommend MVI  -Prostat BID. Each supplement provides 100 kcals, 15 grams of protein  -Monitor oral intake and add snacks/supplements as warranted  NUTRITION DIAGNOSIS: Increased protein needs related to acute illness as evidenced by having PNA/WBC 19.8   Goal: Pt to meet >/= 90% of their estimated nutrition needs   Monitor:  Oral intake, fluid status, labs  Reason for Assessment: C/S for assessment of status  81 y.o. male  Admitting Dx: Acute respiratory failure  ASSESSMENT: 81 year old male with history of nonischemic cardiomyopathy, NSTEMI,, CAD, CKD, stroke, COPD who presented with progressive SOB for the past 3 days. chest x-ray showing multifocal pneumonia and volume overload.  Pt states his appetite has been "great". He has been eating well. He eats 3 meals a day at home with occasional snacks. 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 He does have boost/ensure at home and says he will occasionally have one of these.   He reports his normal weight as 150-152, saying this is his "new normal". He believes his multiple hospital admissions have caused him to gradually lose weight.   He states he is not a snacker and repeatedly declined supplements/snacks saying he thinks he is eating fine and would not like any. He reports that his meals have been fine.   Nutrition Focused Physical Exam:  Subcutaneous Fat:  Orbital Region: Moderate Fat loss Upper Arm Region: Moderate fat loss Thoracic and Lumbar Region: n/a  Muscle:  Temple Region: mild-moderate muscle loss Clavicle Bone Region: moderate muscle loss Clavicle and Acromion Bone Region: moderate muscle loss Scapular Bone Region: n/a Dorsal Hand: age appropriate Patellar Region: n/a Anterior Thigh  Region: n/a Posterior Calf Region: mild muscle loss  Edema: +1  Height: Ht Readings from Last 1 Encounters:  07/20/14  (1.778 m)    Weight: Wt Readings from Last 1 Encounters:  07/26/14 149 lb 9.6 oz (67.858 kg)    Ideal Body Weight: 166 lbs  % Ideal Body Weight: 90%  Wt Readings from Last 10 Encounters:  07/26/14 149 lb 9.6 oz (67.858 kg)  06/12/14 153 lb (69.4 kg)  05/20/14 156 lb 12.8 oz (71.124 kg)  05/13/14 152 lb 12.8 oz (69.31 kg)  05/06/14 143 lb 15.4 oz (65.3 kg)  04/10/14 165 lb 6.4 oz (75.025 kg)  04/04/14 164 lb 6.4 oz (74.571 kg)  03/31/14 164 lb (74.39 kg)  03/03/14 152 lb 5.4 oz (69.1 kg)  02/24/14 169 lb 3.2 oz (76.749 kg)  16 lb loss in 3 months  Usual Body Weight: 150-152 lbs  % Usual Body Weight: 99%  BMI:  Body mass index is 21.47 kg/(m^2).  Estimated Nutritional Needs: Kcal: 1850-1950 (27-29 kcal/kg) Protein: 81-95 (1.2-1.4 g/kg) Fluid: 2 liters  Skin: Dry, flaky, pale, ecchymosis, multiple abrasions  Diet Order: Diet Heart Room service appropriate?: Yes; Fluid consistency:: Thin  EDUCATION NEEDS: -No education needs identified at this time   Intake/Output Summary (Last 24 hours) at 07/26/14 1405 Last data filed at 07/26/14 1045  Gross per 24 hour  Intake    960 ml  Output   1200 ml  Net   -240 ml    Last BM: 4/30  Labs: Hypoalbuminemia, likely related to volume overload and infectious process. Not necessarily indicative of nutritional status   Recent Labs Lab 07/24/14 0340 07/25/14  0431 07/26/14 0407  NA 133* 132* 133*  K 4.8 4.5 4.7  CL 95* 96 97  CO2 23 24 23   BUN 69* 68* 87*  CREATININE 2.15* 2.02* 2.21*  CALCIUM 8.6 8.8 9.0  MG 2.2  --   --   GLUCOSE 108* 104* 92    CBG (last 3)  No results for input(s): GLUCAP in the last 72 hours.  Scheduled Meds: . allopurinol  200 mg Oral Daily  . amiodarone  200 mg Oral Daily  . atorvastatin  40 mg Oral QPM  . azithromycin  250 mg Oral Daily  . carvedilol   6.25 mg Oral BID WC  . clopidogrel  75 mg Oral Daily  . DULoxetine  60 mg Oral Daily  . hydrALAZINE  12.5 mg Oral BID  . ipratropium-albuterol  3 mL Nebulization TID  . isosorbide mononitrate  30 mg Oral Daily  . pantoprazole  80 mg Oral Daily  . predniSONE  20 mg Oral Q breakfast  . sodium chloride  3 mL Intravenous Q12H  . tamsulosin  0.4 mg Oral QHS  . warfarin  2 mg Oral q1800  . Warfarin - Pharmacist Dosing Inpatient   Does not apply q1800    Continuous Infusions:   Past Medical History  Diagnosis Date  . Arthritis   . Non-obstructive CAD     a. 12/2013 NSTEMI/Cath: LM nl, LAD 20, LCX 40-50, RCA 20.  Marland Kitchen Hypertension   . Stroke     a. July 2000;  b. January 2007;  c. TIA in 2014.  . CKD (chronic kidney disease), stage III   . GERD (gastroesophageal reflux disease)   . Foot pain, bilateral   . H/O hematuria   . Hyperlipidemia   . Vitamin D deficiency   . Chronic combined systolic and diastolic CHF (congestive heart failure)     a. 12/2013 Echo: EF 30-35%, mod conc LVH, Gr 3 DD, mild AI, mod-sev MR, mod dil LA, mild TR.  Marland Kitchen PAF (paroxysmal atrial fibrillation)     a. CHA2DS2VASc = 7--> Chronic Coumadin.  Marland Kitchen NICM (nonischemic cardiomyopathy)     a. 12/2013 Echo: EF 30-35%.  . Aortic valve prosthesis present     a. 2006: S/P bioprosthetic AVR.  Marland Kitchen PAD (peripheral artery disease)     a. s/p LLE stenting.  . Carotid arterial disease     a. s/p R CEA  . History of tobacco abuse   . Moderate to Severe Mitral Regurgitation     a. 12/2013 Echo: Mod-Sev MR.  Marland Kitchen Anxiety   . COPD (chronic obstructive pulmonary disease)   . Coronary arteriosclerosis Oct 2015    med Rx  . Shortness of breath dyspnea   . Depression   . Presence of permanent cardiac pacemaker   . Heart murmur   . Dysrhythmia     Past Surgical History  Procedure Laterality Date  . Aortic valve replacement (avr)/coronary artery bypass grafting (cabg)  12/2008  . Eye surgery    . Carotid angiogram  1996  .  Left lower extremity stent    . Carotid endarterectomy    . Insert / replace / remove pacemaker    . Coronary angiogram  Oct 2015  . Pacemaker insertion  Nov 2015    MDT BiV  . Left and right heart catheterization with coronary angiogram N/A 01/14/2014    Procedure: LEFT AND RIGHT HEART CATHETERIZATION WITH CORONARY ANGIOGRAM;  Surgeon: Peter M Swaz[place], MD;  Location[facility] CATH LAB;  Service: Cardiovascular;  Laterality: N/A;  . Bi-ventricular pacemaker insertion N/A 02/17/2014    Procedure: BI-VENTRICULAR PACEMAKER INSERTION (CRT-P);  Surgeon: Marinus Maw, MD;  Location: [facility] CATH LAB;  Service: Cardiovascular;  Laterality: N/A;  . Coronary artery bypass graft    . Coronary angioplasty    . Tonsillectomy    . Cardiac valve replacement      Christophe Louis RD, LDN Nutrition Pager: [phone] 07/26/2014 2:05 PM

## 2014-07-26 NOTE — Progress Notes (Addendum)
[facility] PROGRESS NOTE  Timothy Durham TIW:[id number] DOB: [birth year] DOA: 07/19/2014 PCP: Dennis Bast, MD   brief narrative 81 year old male with history of nonischemic cardiomyopathy with EF of 30-35% as per last echo, NST EMI in 2015 with cardiac cath showing nonobstructive CAD, chronic kidney disease stage III, history of stroke in 2000, history of bioprosthetic aVR, paroxysmal A. fib on chronic Coumadin, tobacco use with? COPD who presented with progressive shortness of breath for the past 3 days associated with nonproductive cough and wheezing. She reports being compliant with these medications. Chest x-ray on admission showing moderate vascular congestion. Labs showed elevated BNP of 1400. Patient admitted for acute hypoxic respiratory failure due to acute bronchitis. During the hospital stay he became increasingly short of breath and wheezy with follow-up chest x-ray showing multifocal pneumonia and volume overload.   Assessment/Plan: Acute hypoxic respiratory failure Initially admitted for acute bronchitis but during the hospital course became increasingly short of breath, broaden antibiotic coverage with empiric vancomycin and cefepime for HCAP,O2 supplement/ duonebs/ oral prednisone. Taper as able  -passed swallow eval on 4/27 with regular diet and thin liquid. -4/29 reported worsening of sob, chest CT ordered. Persistent patchy consolidative and ground glass opacities in all lobes, increased oxygen requirement,  pulmology and ID consulted.Sputum sample recollected, viral panel pending,  ID recommended to stop abx due to clinical course not consistent with typical bacterial infection, vanc/cefepime d/ced on 4/29. On zithromycin from 4/29.   -4/30, patient reported feeling better with lasix. Cardiology to decide on amiodarone use (concerning for pulm side effect?)  Nonischemic cardiomyopathy with acute exacerbation. - euvolemic on admission but in volume overloaded overnight  triggered by underlying pneumonia.. Continue Plavix, Coreg hydralazine, imdur and statin..  Treated with IV Lasix  And oral Aldactone. D/ced due to cr elevation. Monitor strict I/O and daily weight Not on ACEi/ ARB due to CKD Heart failure team consulted, lasix restarted on 4/29.  Acute on chronic kidney disease stage III Baseline creatinine around 1.8-2.  Cr worsening, diuretic held. [place] abd shows medical renal ds. Repeat ua no infection. Avoid renal toxin. Renal dosing meds.   History of CVA and peripheral vascular disease Continue Plavix and statin.  Atrial fibrillation Pace rhythm, Rate controlled. Continue amiodarone and Coumadin. Dosing per pharmacy. May need to d/c amiodarone due to pulmonary toxicity, to be determined by cardiology.  GERD Continue PPI  Malnutrition, low albumin, nutrition consult obtained.  DVT prophylaxis: On Coumadin Diet: Heart healthy  Code Status: DO NOT RESUSCITATE  Family Communication: discussed in detail with patient /wife  Disposition Plan: Monitor on telemetry. Home vs rehab   Consultants:  Heart failure team  pccm  ID  Procedures:  None  Antibiotics:  Z-Pak  Vanc/cefepime from 4/24  HPI/Subjective:  Sitting in chair, on 4liter, patient reported feeling better, reported weight down, concerned about nutrition.   Objective: Filed Vitals:   07/26/14 1042  BP: 109/52  Pulse:   Temp:   Resp:     Intake/Output Summary (Last 24 hours) at 07/26/14 1203 Last data filed at 07/26/14 0855  Gross per 24 hour  Intake   1080 ml  Output   1400 ml  Net   -320 ml   Filed Weights   07/24/14 0516 07/25/14 0800 07/26/14 0451  Weight: 69.854 kg (154 lb) 70.4 kg (155 lb 3.3 oz) 67.858 kg (149 lb 9.6 oz)    Exam:   General:   Elderly male in NAD  HEENT:  JVD+, supple neck, moist oral mucosa  Chest: Scattered rhonchi bilaterally, Left basilar crackles, mild faint wheezing   CVS: S1 and S2 is irregularly irregular, no  murmurs rub or gallop  GI: Soft, nondistended, nontender, BS+  Musculoskeletal: Warm, no edema  Cns: Alert and oriented     Data Reviewed: Basic Metabolic Panel:  Recent Labs Lab 07/21/14 0500 07/23/14 0455 07/24/14 0340 07/25/14 0431 07/26/14 0407  NA 131* 132* 133* 132* 133*  K 3.6 4.0 4.8 4.5 4.7  CL 93* 93* 95* 96 97  CO2 25 26 23 24 23   GLUCOSE 124* 113* 108* 104* 92  BUN 57* 62* 69* 68* 87*  CREATININE 1.94* 1.86* 2.15* 2.02* 2.21*  CALCIUM 8.4 8.5 8.6 8.8 9.0  MG  --   --  2.2  --   --    Liver Function Tests:  Recent Labs Lab 07/20/14 0028 07/26/14 0407  AST 31 42*  ALT 23 51  ALKPHOS 92 130*  BILITOT 1.2 1.8*  PROT 6.2 6.2  ALBUMIN 2.9* 2.2*   No results for input(s): LIPASE, AMYLASE in the last 168 hours. No results for input(s): AMMONIA in the last 168 hours. CBC:  Recent Labs Lab 07/20/14 0028 07/24/14 0340 07/25/14 0431 07/26/14 0407  WBC 12.2* 16.5* 16.7* 19.8*  NEUTROABS 11.3*  --   --   --   HGB 10.2* 10.1* 9.8* 9.4*  HCT 29.9* 28.9* 27.6* 26.2*  MCV 94.9 90.3 88.5 87.9  PLT 242 340 325 340   Cardiac Enzymes: No results for input(s): CKTOTAL, CKMB, CKMBINDEX, TROPONINI in the last 168 hours. BNP (last 3 results)  Recent Labs  05/04/14 1700 07/20/14 0028  BNP 1013.0* 1469.3*    ProBNP (last 3 results)  Recent Labs  02/24/14 1606 02/26/14 1623 03/06/14 1129  PROBNP 2973.0* 18948.0* 5943.0*    CBG: No results for input(s): GLUCAP in the last 168 hours.  Recent Results (from the past 240 hour(s))  Culture, blood (routine x 2)     Status: None   Collection Time: 07/20/14  2:10 AM  Result Value Ref Range Status   Specimen Description BLOOD RIGHT FOREARM  Final   Special Requests BOTTLES DRAWN AEROBIC AND ANAEROBIC 5CC EA  Final   Culture   Final    NO GROWTH 5 DAYS Performed at [facility]    Report Status 07/26/2014 FINAL  Final  Culture, blood (routine x 2)     Status: None   Collection Time:  07/20/14  2:19 AM  Result Value Ref Range Status   Specimen Description BLOOD LEFT FOREARM  Final   Special Requests BOTTLES DRAWN AEROBIC ONLY 4CC  Final   Culture   Final    NO GROWTH 5 DAYS Performed at [facility]    Report Status 07/26/2014 FINAL  Final  MRSA PCR Screening     Status: None   Collection Time: 07/20/14  4:45 AM  Result Value Ref Range Status   MRSA by PCR NEGATIVE NEGATIVE Final    Comment:        The GeneXpert MRSA Assay (FDA approved for NASAL specimens only), is one component of a comprehensive MRSA colonization surveillance program. It is not intended to diagnose MRSA infection nor to guide or monitor treatment for MRSA infections.   Culture, expectorated sputum-assessment     Status: None   Collection Time: 07/23/14 11:23 PM  Result Value Ref Range Status   Specimen Description SPUTUM  Final   Special Requests NONE  Final   Sputum evaluation  Final    MICROSCOPIC FINDINGS SUGGEST THAT THIS SPECIMEN IS NOT REPRESENTATIVE OF LOWER RESPIRATORY SECRETIONS. PLEASE RECOLLECT. RESULT CALLED TO, READ BACK BY AND VERIFIED WITH: NOTIFIED G RUSUL,RN 07/24/14 AT 0040 BY RHOLMES   Report Status 07/24/2014 FINAL  Final  Culture, expectorated sputum-assessment     Status: None   Collection Time: 07/24/14  9:14 PM  Result Value Ref Range Status   Specimen Description SPUTUM  Final   Special Requests Normal  Final   Sputum evaluation   Final    MICROSCOPIC FINDINGS SUGGEST THAT THIS SPECIMEN IS NOT REPRESENTATIVE OF LOWER RESPIRATORY SECRETIONS. PLEASE RECOLLECT. CALLED TO D.HART ,RN AT 0124 BY L.PITT 07/25/14    Report Status 07/25/2014 FINAL  Final  Culture, expectorated sputum-assessment     Status: None   Collection Time: 07/25/14  3:42 PM  Result Value Ref Range Status   Specimen Description SPUTUM  Final   Special Requests Normal  Final   Sputum evaluation   Final    THIS SPECIMEN IS ACCEPTABLE. RESPIRATORY CULTURE REPORT TO FOLLOW.   Report  Status 07/25/2014 FINAL  Final     Studies: Ct Chest Wo Contrast  07/25/2014   CLINICAL DATA:  Hypoxia and progressive shortness of breath for the past 3 days. Nonproductive cough and wheeze  EXAM: CT CHEST WITHOUT CONTRAST  TECHNIQUE: Multidetector CT imaging of the chest was performed following the standard protocol without IV contrast.  COMPARISON:  None.  FINDINGS: THORACIC INLET/BODY WALL:  There is a biventricular pacer from the left with leads in unremarkable position. No acute findings at the thoracic inlet.  There is an incidental 1 cm intramuscular lipoma in the right chest wall.  MEDIASTINUM:  Cardiomegaly which is chronic based on chest x-rays. No pericardial effusion. The patient is status post median sternotomy with aortic valve replacement. The ascending aorta measures 41 mm in diameter. There is mild enlargement of mediastinal lymph nodes which could be reactive to the presumed pneumonia or sequela of remote granulomatous disease (there is also scattered nodal coarse calcification). No acute vascular findings. Negative esophagus.  LUNG WINDOWS:  There is patchy consolidative and ground-glass opacities in all lobes. Airspace disease is most extensive in the left upper lobe and most dense in the posterior segment right upper lobe. Small bilateral pleural effusions with interlobular septal thickening. No cavitation. No evidence of loculated effusion.  UPPER ABDOMEN:  Granulomatous changes in the liver and spleen.  OSSEOUS:  No acute findings.  IMPRESSION: 1. Multilobar pneumonia. 2. Superimposed interstitial edema and small pleural effusions. 3. Chronic and postoperative findings noted above.   Electronically Signed   By: Marnee Spring M.D.   On: 07/25/2014 08:00    Scheduled Meds: . allopurinol  200 mg Oral Daily  . amiodarone  200 mg Oral Daily  . atorvastatin  40 mg Oral QPM  . azithromycin  250 mg Oral Daily  . carvedilol  6.25 mg Oral BID WC  . clopidogrel  75 mg Oral Daily  .  DULoxetine  60 mg Oral Daily  . hydrALAZINE  12.5 mg Oral BID  . ipratropium-albuterol  3 mL Nebulization TID  . isosorbide mononitrate  30 mg Oral Daily  . pantoprazole  80 mg Oral Daily  . predniSONE  20 mg Oral Q breakfast  . sodium chloride  3 mL Intravenous Q12H  . tamsulosin  0.4 mg Oral QHS  . warfarin  2 mg Oral q1800  . Warfarin - Pharmacist Dosing Inpatient   Does not apply  q1800   Continuous Infusions:     Time spent:35 minutes    Halim Surrette  [facility] Pager [phone]. If 7PM-7AM, please contact night-coverage at [email or web address], password [facility] 07/26/2014, 12:03 PM  LOS: 6 days

## 2014-07-26 NOTE — Progress Notes (Signed)
Anticoagulation CONSULT NOTE  Pharmacy Consult for Coumadin Indication: atrial fibrillation  Allergies  Allergen Reactions  . Ambien [Zolpidem] Other (See Comments)    Hallucinations  . Ativan [Lorazepam] Other (See Comments)    hallucinations  . Oxycontin [Oxycodone] Other (See Comments)    hallucinations  . Penicillins     Unknown childhood reaction   . Sulfa Antibiotics     unknown    Patient Measurements: Height: 5\' 10"  (177.8 cm) Weight: 149 lb 9.6 oz (67.858 kg) (Scale B) IBW/kg (Calculated) : 73  Vital Signs: Temp: 97.8 F (36.6 C) (04/30 0451) Temp Source: Oral (04/30 0451) BP: 114/56 mmHg (04/30 0451) Pulse Rate: 76 (04/30 0451)  Labs:  Recent Labs  07/24/14 0340 07/25/14 0431 07/26/14 0407  HGB 10.1* 9.8* 9.4*  HCT 28.9* 27.6* 26.2*  PLT 340 325 340  LABPROT 25.2* 25.8* 25.7*  INR 2.27* 2.34* 2.33*  CREATININE 2.15* 2.02* 2.21*    Estimated Creatinine Clearance: 25.2 mL/min (by C-G formula based on Cr of 2.21).  Assessment: 81yo male to continue on pta coumadin for PAF (last pta dose 4/23). INR 2.99 on admit. Also on amio po pta. Warfarin held on 4/24 but restarted on 4/25. INR remains stable at therapeutic level of 2.33 on home dose. H/H low but stable, plt wnl and stable with no reported significant s/s bleeding.   PTA warfarin dose: 2mg  daily  Goal of Therapy:  INR 2-3   Plan:  - Continue home dose of warfarin 2 mg PO daily - Monitor PT/INR on MWF since INR has been stable on home dose - Follow for s/sx bleeding  [place] Memmer K. Bonnye Fava, PharmD, BCPS Clinical Pharmacist - Resident Pager: [phone] Pharmacy: [phone] 07/26/2014 10:29 AM

## 2014-07-27 DIAGNOSIS — I481 Persistent atrial fibrillation: Secondary | ICD-10-CM

## 2014-07-27 LAB — COMPREHENSIVE METABOLIC PANEL
ALBUMIN: 2.1 g/dL — AB (ref 3.5–5.0)
ALK PHOS: 126 U/L (ref 38–126)
ALT: 44 U/L (ref 17–63)
ANION GAP: 12 (ref 5–15)
AST: 30 U/L (ref 15–41)
BILIRUBIN TOTAL: 1.2 mg/dL (ref 0.3–1.2)
BUN: 82 mg/dL — AB (ref 6–20)
CHLORIDE: 92 mmol/L — AB (ref 101–111)
CO2: 28 mmol/L (ref 22–32)
Calcium: 9.4 mg/dL (ref 8.9–10.3)
Creatinine, Ser: 2.26 mg/dL — ABNORMAL HIGH (ref 0.61–1.24)
GFR calc Af Amer: 30 mL/min — ABNORMAL LOW (ref >60)
GFR, EST NON AFRICAN AMERICAN: 26 mL/min — AB (ref >60)
GLUCOSE: 89 mg/dL (ref 70–99)
POTASSIUM: 4.1 mmol/L (ref 3.5–5.1)
Sodium: 132 mmol/L — ABNORMAL LOW (ref 135–145)
Total Protein: 6 g/dL — ABNORMAL LOW (ref 6.5–8.1)

## 2014-07-27 LAB — RESPIRATORY VIRUS PANEL
ADENOVIRUS: NEGATIVE
Influenza A: NEGATIVE
Influenza B: NEGATIVE
Metapneumovirus: NEGATIVE
Parainfluenza 1: NEGATIVE
Parainfluenza 2: NEGATIVE
Parainfluenza 3: NEGATIVE
RESPIRATORY SYNCYTIAL VIRUS B: NEGATIVE
Respiratory Syncytial Virus A: NEGATIVE
Rhinovirus: NEGATIVE

## 2014-07-27 LAB — PROCALCITONIN: PROCALCITONIN: 0.39 ng/mL

## 2014-07-27 LAB — MAGNESIUM: Magnesium: 2.2 mg/dL (ref 1.7–2.4)

## 2014-07-27 MED ORDER — ACETAMINOPHEN 500 MG PO TABS
500.0000 mg | ORAL_TABLET | Freq: Three times a day (TID) | ORAL | Status: DC | PRN
  Administered 2014-07-27 – 2014-08-05 (×9): 500 mg via ORAL
  Filled 2014-07-27 (×9): qty 1

## 2014-07-27 MED ORDER — TAMSULOSIN HCL 0.4 MG PO CAPS
0.8000 mg | ORAL_CAPSULE | Freq: Every day | ORAL | Status: DC
  Administered 2014-07-27 – 2014-08-06 (×11): 0.8 mg via ORAL
  Filled 2014-07-27 (×12): qty 2

## 2014-07-27 NOTE — Progress Notes (Addendum)
[facility] PROGRESS NOTE  Timothy Durham QMV:[id number] DOB: July 03, 1930 DOA: 07/19/2014 PCP: Dennis Bast, MD   brief narrative 81 year old male with history of nonischemic cardiomyopathy with EF of 30-35% as per last echo, NST EMI in 2015 with cardiac cath showing nonobstructive CAD, chronic kidney disease stage III, history of stroke in 2000, history of bioprosthetic aVR, paroxysmal A. fib on chronic Coumadin, tobacco use with? COPD who presented with progressive shortness of breath for the past 3 days associated with nonproductive cough and wheezing. She reports being compliant with these medications. Chest x-ray on admission showing moderate vascular congestion. Labs showed elevated BNP of 1400. Patient admitted for acute hypoxic respiratory failure due to acute bronchitis. During the hospital stay he became increasingly short of breath and wheezy with follow-up chest x-ray showing multifocal pneumonia and volume overload.   Assessment/Plan: Acute hypoxic respiratory failure Initially admitted for acute bronchitis but during the hospital course became increasingly short of breath, broaden antibiotic coverage with empiric vancomycin and cefepime for HCAP,O2 supplement/ duonebs/ oral prednisone. Taper as able  -passed swallow eval on 4/27 with regular diet and thin liquid. -4/29 reported worsening of sob, chest CT showed Persistent patchy consolidative and ground glass opacities in all lobes, increased oxygen requirement,  pulmology and ID consulted.Sputum sample recollected, result pending, viral panel negative,  ID recommended to stop abx due to clinical course not consistent with typical bacterial infection, vanc/cefepime d/ced on 4/29. On zithromycin from 4/29 to 5/1 for atypical? zithromax d/ced, less likely infectious per ID.  -Cardiology to decide on amiodarone use (concerning for pulm side effect?) -pulmonology to decide bronchoscopy  Nonischemic cardiomyopathy with acute  exacerbation. - euvolemic on admission but in volume overloaded overnight triggered by underlying pneumonia.. Continue Plavix, Coreg hydralazine, imdur and statin..  Treated with IV Lasix  And oral Aldactone. D/ced due to cr elevation. Monitor strict I/O and daily weight Not on ACEi/ ARB due to CKD Heart failure team consulted, on and off lasix, last dose on 4/29.  -5/1 patient reported notice ankle edema, cards to determine lasix dose.  Acute on chronic kidney disease stage III Baseline creatinine around 1.8-2.  [place] abd shows medical renal ds. Repeat ua no infection. Avoid renal toxin. Renal dosing meds. Cr worsening, diuretic held since 4/30.  History of CVA and peripheral vascular disease Continue Plavix and statin.  Atrial fibrillation Pace rhythm, Rate controlled. Continue amiodarone and Coumadin. Dosing per pharmacy. May need to d/c amiodarone due to pulmonary toxicity, to be determined by cardiology.  GERD Continue PPI  Malnutrition, low albumin, nutrition consult obtained.  BPH: urinary retention of >600 on 5/1, in and out cathx1, increase flomax.  DVT prophylaxis: On Coumadin Diet: Heart healthy  Code Status: DO NOT RESUSCITATE  Family Communication: discussed in detail with patient  Disposition Plan: remain inpatient, Home vs rehab at discharge   Consultants:  Heart failure team  pccm  ID  Procedures:  None  Antibiotics:  Zithromax last dose 5/1  Vanc/cefepime from 4/24-4/29  HPI/Subjective:  Sitting in chair, on 4liter, patient reported feeling better, reported weight down, concerned about nutrition.   Objective: Filed Vitals:   07/27/14 1447  BP: 120/38  Pulse: 73  Temp: 97.7 F (36.5 C)  Resp: 20    Intake/Output Summary (Last 24 hours) at 07/27/14 1555 Last data filed at 07/27/14 1447  Gross per 24 hour  Intake    843 ml  Output   1200 ml  Net   -357 ml   [employer]  07/25/14 0800 07/26/14 0451 07/27/14 0401  Weight: 70.4  kg (155 lb 3.3 oz) 67.858 kg (149 lb 9.6 oz) 68.493 kg (151 lb)    Exam:   General:   Elderly male in NAD, sitting in chair.  HEENT:  JVD+, supple neck, moist oral mucosa  Chest: Scattered rhonchi bilaterally, Left basilar crackles, mild faint wheezing   CVS: S1 and S2 is irregularly irregular, no murmurs rub or gallop  GI: Soft, nondistended, nontender, BS+  Musculoskeletal: Warm, mild bilateral ankle edema  Cns: Alert and oriented     Data Reviewed: Basic Metabolic Panel:  Recent Labs Lab 07/23/14 0455 07/24/14 0340 07/25/14 0431 07/26/14 0407 07/27/14 0539  NA 132* 133* 132* 133* 132*  K 4.0 4.8 4.5 4.7 4.1  CL 93* 95* 96 97 92*  CO2 26 23 24 23 28   GLUCOSE 113* 108* 104* 92 89  BUN 62* 69* 68* 87* 82*  CREATININE 1.86* 2.15* 2.02* 2.21* 2.26*  CALCIUM 8.5 8.6 8.8 9.0 9.4  MG  --  2.2  --   --  2.2   Liver Function Tests:  Recent Labs Lab 07/26/14 0407 07/27/14 0539  AST 42* 30  ALT 51 44  ALKPHOS 130* 126  BILITOT 1.8* 1.2  PROT 6.2 6.0*  ALBUMIN 2.2* 2.1*   No results for input(s): LIPASE, AMYLASE in the last 168 hours. No results for input(s): AMMONIA in the last 168 hours. CBC:  Recent Labs Lab 07/24/14 0340 07/25/14 0431 07/26/14 0407  WBC 16.5* 16.7* 19.8*  HGB 10.1* 9.8* 9.4*  HCT 28.9* 27.6* 26.2*  MCV 90.3 88.5 87.9  PLT 340 325 340   Cardiac Enzymes: No results for input(s): CKTOTAL, CKMB, CKMBINDEX, TROPONINI in the last 168 hours. BNP (last 3 results)  Recent Labs  05/04/14 1700 07/20/14 0028  BNP 1013.0* 1469.3*    ProBNP (last 3 results)  Recent Labs  02/24/14 1606 02/26/14 1623 03/06/14 1129  PROBNP 2973.0* 18948.0* 5943.0*    CBG: No results for input(s): GLUCAP in the last 168 hours.  Recent Results (from the past 240 hour(s))  Culture, blood (routine x 2)     Status: None   Collection Time: 07/20/14  2:10 AM  Result Value Ref Range Status   Specimen Description BLOOD RIGHT FOREARM  Final   Special  Requests BOTTLES DRAWN AEROBIC AND ANAEROBIC 5CC EA  Final   Culture   Final    NO GROWTH 5 DAYS Performed at [facility]    Report Status 07/26/2014 FINAL  Final  Culture, blood (routine x 2)     Status: None   Collection Time: 07/20/14  2:19 AM  Result Value Ref Range Status   Specimen Description BLOOD LEFT FOREARM  Final   Special Requests BOTTLES DRAWN AEROBIC ONLY 4CC  Final   Culture   Final    NO GROWTH 5 DAYS Performed at [facility]    Report Status 07/26/2014 FINAL  Final  MRSA PCR Screening     Status: None   Collection Time: 07/20/14  4:45 AM  Result Value Ref Range Status   MRSA by PCR NEGATIVE NEGATIVE Final    Comment:        The GeneXpert MRSA Assay (FDA approved for NASAL specimens only), is one component of a comprehensive MRSA colonization surveillance program. It is not intended to diagnose MRSA infection nor to guide or monitor treatment for MRSA infections.   Culture, expectorated sputum-assessment     Status: None  Collection Time: 07/23/14 11:23 PM  Result Value Ref Range Status   Specimen Description SPUTUM  Final   Special Requests NONE  Final   Sputum evaluation   Final    MICROSCOPIC FINDINGS SUGGEST THAT THIS SPECIMEN IS NOT REPRESENTATIVE OF LOWER RESPIRATORY SECRETIONS. PLEASE RECOLLECT. RESULT CALLED TO, READ BACK BY AND VERIFIED WITH: NOTIFIED G RUSUL,RN 07/24/14 AT 0040 BY RHOLMES   Report Status 07/24/2014 FINAL  Final  Culture, expectorated sputum-assessment     Status: None   Collection Time: 07/24/14  9:14 PM  Result Value Ref Range Status   Specimen Description SPUTUM  Final   Special Requests Normal  Final   Sputum evaluation   Final    MICROSCOPIC FINDINGS SUGGEST THAT THIS SPECIMEN IS NOT REPRESENTATIVE OF LOWER RESPIRATORY SECRETIONS. PLEASE RECOLLECT. CALLED TO D.HART ,RN AT 0124 BY L.PITT 07/25/14    Report Status 07/25/2014 FINAL  Final  Culture, expectorated sputum-assessment     Status: None    Collection Time: 07/25/14  3:42 PM  Result Value Ref Range Status   Specimen Description SPUTUM  Final   Special Requests Normal  Final   Sputum evaluation   Final    THIS SPECIMEN IS ACCEPTABLE. RESPIRATORY CULTURE REPORT TO FOLLOW.   Report Status 07/25/2014 FINAL  Final  Culture, respiratory (NON-Expectorated)     Status: None (Preliminary result)   Collection Time: 07/25/14  3:42 PM  Result Value Ref Range Status   Specimen Description SPUTUM  Final   Special Requests NONE  Final   Gram Stain PENDING  Incomplete   Culture   Final    Culture reincubated for better growth Performed at [facility]    Report Status PENDING  Incomplete  Respiratory virus panel     Status: None   Collection Time: 07/25/14  8:03 PM  Result Value Ref Range Status   Respiratory Syncytial Virus A Negative Negative Final   Respiratory Syncytial Virus B Negative Negative Final   Influenza A Negative Negative Final   Influenza B Negative Negative Final   Parainfluenza 1 Negative Negative Final   Parainfluenza 2 Negative Negative Final   Parainfluenza 3 Negative Negative Final   Metapneumovirus Negative Negative Final   Rhinovirus Negative Negative Final   Adenovirus Negative Negative Final    Comment: (NOTE) Performed At: [facility] [address] [place], [place] [id number] Mila Homer MD WU:[id number]      Studies: No results found.  Scheduled Meds: . allopurinol  200 mg Oral Daily  . amiodarone  200 mg Oral Daily  . atorvastatin  40 mg Oral QPM  . carvedilol  6.25 mg Oral BID WC  . clopidogrel  75 mg Oral Daily  . DULoxetine  60 mg Oral Daily  . feeding supplement (PRO-STAT SUGAR FREE 64)  30 mL Oral BID  . hydrALAZINE  12.5 mg Oral BID  . ipratropium-albuterol  3 mL Nebulization TID  . isosorbide mononitrate  30 mg Oral Daily  . pantoprazole  80 mg Oral Daily  . predniSONE  20 mg Oral Q breakfast  . sodium chloride  3 mL Intravenous Q12H  . tamsulosin  0.4 mg  Oral QHS  . warfarin  2 mg Oral q1800  . Warfarin - Pharmacist Dosing Inpatient   Does not apply q1800   Continuous Infusions:     Time spent:35 minutes    Shirlie Enck  [facility] Pager [phone]. If 7PM-7AM, please contact night-coverage at [email or web address], password [email or web address] 07/27/2014, 3:55 PM  LOS:  7 days

## 2014-07-27 NOTE — Progress Notes (Signed)
Patient ID: Timothy Durham, male   DOB: 1930-08-16, 81 y.o.   MRN: [id number]    Primary cardiologist:  Subjective:    Stable SOB  Objective:   Temp:  [97.6 F (36.4 C)-98.1 F (36.7 C)] 98.1 F (36.7 C) (05/01 0401) Pulse Rate:  [75-93] 93 (05/01 0401) Resp:  [20] 20 (05/01 0401) BP: (106-133)/(45-54) 106/54 mmHg (05/01 0401) SpO2:  [91 %-99 %] 91 % (05/01 0401) Weight:  [151 lb (68.493 kg)] 151 lb (68.493 kg) (05/01 0401) Last BM Date: 07/26/14  Filed Weights   07/25/14 0800 07/26/14 0451 07/27/14 0401  Weight: 155 lb 3.3 oz (70.4 kg) 149 lb 9.6 oz (67.858 kg) 151 lb (68.493 kg)    Intake/Output Summary (Last 24 hours) at 07/27/14 0807 Last data filed at 07/27/14 0616  Gross per 24 hour  Intake    960 ml  Output   1200 ml  Net   -240 ml     Exam:  General: NAD  Resp: coarse breath sounds bilateral bases  Cardiac: RRR, 2/6 systolic murmur RUSB, no JVD  GI: abdomen soft, NT, ND  MSK: trace bilateral edema  Neuro: no focal deficits  Psych: appropriate affect  Lab Results:  Basic Metabolic Panel:  Recent Labs Lab 07/24/14 0340 07/25/14 0431 07/26/14 0407 07/27/14 0539  NA 133* 132* 133* 132*  K 4.8 4.5 4.7 4.1  CL 95* 96 97 92*  CO2 23 24 23 28   GLUCOSE 108* 104* 92 89  BUN 69* 68* 87* 82*  CREATININE 2.15* 2.02* 2.21* 2.26*  CALCIUM 8.6 8.8 9.0 9.4  MG 2.2  --   --  2.2    Liver Function Tests:  Recent Labs Lab 07/26/14 0407 07/27/14 0539  AST 42* 30  ALT 51 44  ALKPHOS 130* 126  BILITOT 1.8* 1.2  PROT 6.2 6.0*  ALBUMIN 2.2* 2.1*    CBC:  Recent Labs Lab 07/24/14 0340 07/25/14 0431 07/26/14 0407  WBC 16.5* 16.7* 19.8*  HGB 10.1* 9.8* 9.4*  HCT 28.9* 27.6* 26.2*  MCV 90.3 88.5 87.9  PLT 340 325 340    Cardiac Enzymes: No results for input(s): CKTOTAL, CKMB, CKMBINDEX, TROPONINI in the last 168 hours.  BNP:  Recent Labs  02/24/14 1606 02/26/14 1623 03/06/14 1129  PROBNP 2973.0* 18948.0* 5943.0*     Coagulation:  Recent Labs Lab 07/24/14 0340 07/25/14 0431 07/26/14 0407  INR 2.27* 2.34* 2.33*    ECG:   Medications:   Scheduled Medications: . allopurinol  200 mg Oral Daily  . amiodarone  200 mg Oral Daily  . atorvastatin  40 mg Oral QPM  . azithromycin  250 mg Oral Daily  . carvedilol  6.25 mg Oral BID WC  . clopidogrel  75 mg Oral Daily  . DULoxetine  60 mg Oral Daily  . feeding supplement (PRO-STAT SUGAR FREE 64)  30 mL Oral BID  . hydrALAZINE  12.5 mg Oral BID  . ipratropium-albuterol  3 mL Nebulization TID  . isosorbide mononitrate  30 mg Oral Daily  . pantoprazole  80 mg Oral Daily  . predniSONE  20 mg Oral Q breakfast  . sodium chloride  3 mL Intravenous Q12H  . tamsulosin  0.4 mg Oral QHS  . warfarin  2 mg Oral q1800  . Warfarin - Pharmacist Dosing Inpatient   Does not apply q1800     Infusions:     PRN Medications:  acetaminophen, albuterol     Assessment/Plan    1. Acute on chronic systolic  HF - 12/2013 echo LVEF 30-35%, grade III diastolic dysfunction - admitted with pneumonia, initially with some volume overload as well - diuretics currently on hold due to elevated Cr and BUN. Baseline Cr variable over last 4  months 1.4-2. Now up to 2.26 with BUN of 82. Continue to hold diuretics  2. Afib - on amio, coreg, coumadin - will defer to primary team/CHF service decision/duration of continued amiodarone  3. Pneumona - abx per primary team  4. AKI - unclear etiology. Potentially related to severe pneumonia or abx. Continue to follow   Dina Rich, M.D.

## 2014-07-27 NOTE — Progress Notes (Signed)
Anticoagulation CONSULT NOTE  Pharmacy Consult for Coumadin Indication: atrial fibrillation  Allergies  Allergen Reactions  . Ambien [Zolpidem] Other (See Comments)    Hallucinations  . Ativan [Lorazepam] Other (See Comments)    hallucinations  . Oxycontin [Oxycodone] Other (See Comments)    hallucinations  . Penicillins     Unknown childhood reaction   . Sulfa Antibiotics     unknown    Patient Measurements: Height: 5\' 10"  (177.8 cm) Weight: 151 lb (68.493 kg) (scale b) IBW/kg (Calculated) : 73  Vital Signs: Temp: 98.1 F (36.7 C) (05/01 0401) Temp Source: Oral (05/01 0401) BP: 106/54 mmHg (05/01 0401) Pulse Rate: 93 (05/01 0401)  Labs:  Recent Labs  07/25/14 0431 07/26/14 0407 07/27/14 0539  HGB 9.8* 9.4*  --   HCT 27.6* 26.2*  --   PLT 325 340  --   LABPROT 25.8* 25.7*  --   INR 2.34* 2.33*  --   CREATININE 2.02* 2.21* 2.26*    Estimated Creatinine Clearance: 24.8 mL/min (by C-G formula based on Cr of 2.26).  Assessment: 81yo male to continue on pta coumadin for PAF (last pta dose 4/23). INR 2.99 on admit. Also on amio po pta. Warfarin held on 4/24 but restarted on 4/25. INR stable at therapeutic level of 2.33 from 4/30 on home dose. H/H low but stable, plt wnl and stable with no reported significant s/s bleeding.   PTA warfarin dose: 2mg  daily  Goal of Therapy:  INR 2-3   Plan:  - Continue home dose of warfarin 2 mg PO daily - Monitor PT/INR on MWF since INR has been stable on home dose - Follow for s/sx bleeding  Tynika Luddy K. Bonnye Fava, PharmD, BCPS Clinical Pharmacist - Resident Pager: [phone] Pharmacy: [phone] 07/27/2014 9:37 AM

## 2014-07-27 NOTE — Progress Notes (Signed)
   Name: Timothy Durham MRN: [id number] DOB: [birth year]    ADMISSION DATE:  07/19/2014 CONSULTATION DATE:  4/29  REFERRING MD :  Roda Shutters  CHIEF COMPLAINT:  Pneumonia not improving   BRIEF PATIENT DESCRIPTION:  81 year old male w/ known sig h/o systolic CM (EF 30-35%), CKD stage IV, prior AVR, and PAF. Admitted 4/23 w/ working dx of PNA w/ element of decompensated HF/pulmonary edema. Treated w/ broad spec abx and lasix until creatinine climbed and interrogation of optivol device on pacemaker suggested euvolemia on 4/27. 4/29 early evening suddenly more short of breath. CT chest obtained showing diffuse ground-glass opacities bilateral. PCCM asked to see re: "pneumonia not improving"   SIGNIFICANT EVENTS    STUDIES:  CT chest 4/29: diffuse patchy GG opacifications bilaterally, small bilateral pleural effusions.   SUBJECTIVE:  "Very little improvement" Still desaturates easily with exertion. Family note he can now speak full sentences at rest on O2, and coughing out scant clear, where originally it looked bloody.  VITAL SIGNS: Temp:  [97.6 F (36.4 C)-98.1 F (36.7 C)] 98.1 F (36.7 C) (05/01 0401) Pulse Rate:  [75-93] 93 (05/01 0401) Resp:  [20] 20 (05/01 0401) BP: (106-133)/(45-54) 106/54 mmHg (05/01 0401) SpO2:  [91 %-99 %] 96 % (05/01 0827) Weight:  [68.493 kg (151 lb)] 68.493 kg (151 lb) (05/01 0401)  PHYSICAL EXAMINATION: General:  81 year old male sitting in chair. No distress.  Neuro:  Alert, oriented, no focal def, seems sharp HEENT:  CN, no JVD, speech clear, no stridor  Cardiovascular:  Reg irreg, IV/VI SEM Lungs:  Diffuse rales, no accessory muscle use  Abdomen:  Soft, non-tender + bowel sounds  Musculoskeletal:  Intact  Skin:  Trace ankle edema    I reviewed CT images from 4/29   Recent Labs Lab 07/25/14 0431 07/26/14 0407 07/27/14 0539  NA 132* 133* 132*  K 4.5 4.7 4.1  CL 96 97 92*  CO2 24 23 28   BUN 68* 87* 82*  CREATININE 2.02* 2.21* 2.26*  GLUCOSE 104*  92 89    Recent Labs Lab 07/24/14 0340 07/25/14 0431 07/26/14 0407  HGB 10.1* 9.8* 9.4*  HCT 28.9* 27.6* 26.2*  WBC 16.5* 16.7* 19.8*  PLT 340 325 340   No results found.  ASSESSMENT / PLAN:  Acute hypoxic respiratory failure in setting of diffuse bilateral infiltrates.. Culture data to date has been non-diagnostic. Had swallow eval and did not show aspiration.  Need to consider alternative ddx such as viral PNA/pneumonitis, hypersensitivity pneumonitis  (from fumes/dust from furniture) or drug induced pneumonitis as has not had normal chest xray dating back for over a year. Nothing in his hx suggests auto-immune etiology.  Procalcitonin declining from 0.53 to 0.39. Resp Viral panel NEG, WBC was 19,800, but has not been "toxic".  Sputum was bloody or rusty? earlier in course. CXR review back to 11/15 shows persistent vascular congestion and CE, but CT shows dense consolidation. Need to distinguish acute from chronic status. Plan Update CXR 5/2, requesting downstairs 2V  NICM (EF 30-35%), CKD stage III, h/o CVA, atrial fib, and GERD Plan Per cardiac, ID and internal medicine teams.      Waymon Budge, MD [phone]  07/27/2014, 10:53 AM

## 2014-07-27 NOTE — Progress Notes (Signed)
Patient last urine output was 0400 am, patient denies pain scan patient bladder, machine show 677 ml urine. MD notified, order for in and out cath given. I&O cath done, 525 ml urine return. Patient tolerate well. Will continue to monitor patient.

## 2014-07-27 NOTE — Progress Notes (Signed)
[facility]    Date of Admission:  07/19/2014   Total days of antibiotics 8        Day 3 of azithromycin           ID: Timothy Durham is a 81 y.o. male with  Principal Problem:   Acute respiratory failure Active Problems:   Chronic systolic heart failure   Atrial fibrillation   Acute renal failure superimposed on stage 3 chronic kidney disease   COPD exacerbation   Hypoxemia   Pneumonia, organism unspecified   Purulent bronchitis   Hypoxia    Subjective: Remains afebrile, but still having increasing shortness of breath, continues sup ox need at rest  Medications:  . allopurinol  200 mg Oral Daily  . amiodarone  200 mg Oral Daily  . atorvastatin  40 mg Oral QPM  . carvedilol  6.25 mg Oral BID WC  . clopidogrel  75 mg Oral Daily  . DULoxetine  60 mg Oral Daily  . feeding supplement (PRO-STAT SUGAR FREE 64)  30 mL Oral BID  . hydrALAZINE  12.5 mg Oral BID  . ipratropium-albuterol  3 mL Nebulization TID  . isosorbide mononitrate  30 mg Oral Daily  . pantoprazole  80 mg Oral Daily  . predniSONE  20 mg Oral Q breakfast  . sodium chloride  3 mL Intravenous Q12H  . tamsulosin  0.4 mg Oral QHS  . warfarin  2 mg Oral q1800  . Warfarin - Pharmacist Dosing Inpatient   Does not apply q1800    Objective: Vital signs in last 24 hours: Temp:  [97.7 F (36.5 C)-98.1 F (36.7 C)] 97.7 F (36.5 C) (05/01 1447) Pulse Rate:  [73-93] 73 (05/01 1447) Resp:  [20] 20 (05/01 1447) BP: (106-133)/(38-54) 120/38 mmHg (05/01 1447) SpO2:  [91 %-99 %] 97 % (05/01 1447) Weight:  [151 lb (68.493 kg)] 151 lb (68.493 kg) (05/01 0401) Physical Exam  Constitutional: He is oriented to person, place, and time. He appears well-developed and well-nourished. No distress. Wearing oxygebn, appears stated age HENT:  Mouth/Throat: Oropharynx is clear and moist. No oropharyngeal exudate.  Cardiovascular: Normal rate, + murmur BH at apex nad LUSB Pulmonary/Chest: Effort normal  and breath sounds normal. No respiratory distress. He has no wheezes. + velcro sounds at right base Abdominal: Soft. Bowel sounds are normal. He exhibits no distension. There is no tenderness.  Lymphadenopathy:  He has no cervical adenopathy.  Skin: Skin is warm and dry. No rash noted. No erythema.   Lab Results  Recent Labs  07/25/14 0431 07/26/14 0407 07/27/14 0539  WBC 16.7* 19.8*  --   HGB 9.8* 9.4*  --   HCT 27.6* 26.2*  --   NA 132* 133* 132*  K 4.5 4.7 4.1  CL 96 97 92*  CO2 24 23 28   BUN 68* 87* 82*  CREATININE 2.02* 2.21* 2.26*   Liver Panel  Recent Labs  07/26/14 0407 07/27/14 0539  PROT 6.2 6.0*  ALBUMIN 2.2* 2.1*  AST 42* 30  ALT 51 44  ALKPHOS 130* 126  BILITOT 1.8* 1.2  BILIDIR 0.5  --   IBILI 1.3*  --    Sedimentation Rate  Recent Labs  07/25/14 1419  ESRSEDRATE 134*   C-Reactive Protein No results for input(s): CRP in the last 72 hours.  Microbiology:  Studies/Results: No results found.   Assessment/Plan: Diffuse lung infiltrates = reviewed CT findings with family of diffuse ground glass L>R . Currently being treated  for atypical infections with azithromycin. stiill feel this is less likely infection. Recommend to discontinue azithromycin, consider discussing with pulmonary and whether this could be non-infectious like BOOP/COP.   Drue Second [address] [facility] Cell: [phone] Pager: [phone]  07/27/2014, 5:03 PM

## 2014-07-28 ENCOUNTER — Encounter (HOSPITAL_COMMUNITY): Payer: Self-pay | Admitting: Cardiology

## 2014-07-28 ENCOUNTER — Inpatient Hospital Stay (HOSPITAL_COMMUNITY): Payer: Medicare HMO

## 2014-07-28 DIAGNOSIS — R9389 Abnormal findings on diagnostic imaging of other specified body structures: Secondary | ICD-10-CM | POA: Insufficient documentation

## 2014-07-28 DIAGNOSIS — J9601 Acute respiratory failure with hypoxia: Secondary | ICD-10-CM | POA: Insufficient documentation

## 2014-07-28 DIAGNOSIS — I428 Other cardiomyopathies: Secondary | ICD-10-CM

## 2014-07-28 DIAGNOSIS — R938 Abnormal findings on diagnostic imaging of other specified body structures: Secondary | ICD-10-CM

## 2014-07-28 DIAGNOSIS — K219 Gastro-esophageal reflux disease without esophagitis: Secondary | ICD-10-CM

## 2014-07-28 LAB — CBC
HCT: 27.5 % — ABNORMAL LOW (ref 39.0–52.0)
Hemoglobin: 9.7 g/dL — ABNORMAL LOW (ref 13.0–17.0)
MCH: 31.2 pg (ref 26.0–34.0)
MCHC: 35.3 g/dL (ref 30.0–36.0)
MCV: 88.4 fL (ref 78.0–100.0)
Platelets: 367 10*3/uL (ref 150–400)
RBC: 3.11 MIL/uL — AB (ref 4.22–5.81)
RDW: 16.2 % — AB (ref 11.5–15.5)
WBC: 19.6 10*3/uL — AB (ref 4.0–10.5)

## 2014-07-28 LAB — COMPREHENSIVE METABOLIC PANEL
ALK PHOS: 123 U/L (ref 38–126)
ALT: 38 U/L (ref 17–63)
ANION GAP: 10 (ref 5–15)
AST: 26 U/L (ref 15–41)
Albumin: 2.1 g/dL — ABNORMAL LOW (ref 3.5–5.0)
BUN: 82 mg/dL — ABNORMAL HIGH (ref 6–20)
CALCIUM: 9.9 mg/dL (ref 8.9–10.3)
CO2: 29 mmol/L (ref 22–32)
Chloride: 95 mmol/L — ABNORMAL LOW (ref 101–111)
Creatinine, Ser: 2.18 mg/dL — ABNORMAL HIGH (ref 0.61–1.24)
GFR calc Af Amer: 31 mL/min — ABNORMAL LOW (ref >60)
GFR calc non Af Amer: 27 mL/min — ABNORMAL LOW (ref >60)
GLUCOSE: 104 mg/dL — AB (ref 70–99)
POTASSIUM: 4.3 mmol/L (ref 3.5–5.1)
SODIUM: 134 mmol/L — AB (ref 135–145)
Total Bilirubin: 1.1 mg/dL (ref 0.3–1.2)
Total Protein: 6.3 g/dL — ABNORMAL LOW (ref 6.5–8.1)

## 2014-07-28 LAB — PROTIME-INR
INR: 2.25 — AB (ref 0.00–1.49)
Prothrombin Time: 25.1 seconds — ABNORMAL HIGH (ref 11.6–15.2)

## 2014-07-28 LAB — BRAIN NATRIURETIC PEPTIDE: B NATRIURETIC PEPTIDE 5: 2044.9 pg/mL — AB (ref 0.0–100.0)

## 2014-07-28 MED ORDER — AMIODARONE HCL 100 MG PO TABS: 100.0000 mg | ORAL_TABLET | Freq: Every day | ORAL | Status: DC

## 2014-07-28 MED ORDER — METHYLPREDNISOLONE SODIUM SUCC 125 MG IJ SOLR
80.0000 mg | Freq: Two times a day (BID) | INTRAMUSCULAR | Status: DC
  Administered 2014-07-28 – 2014-07-31 (×6): 80 mg via INTRAVENOUS
  Filled 2014-07-28 (×4): qty 1.28
  Filled 2014-07-28: qty 2
  Filled 2014-07-28: qty 1.28
  Filled 2014-07-28: qty 2
  Filled 2014-07-28: qty 1.28

## 2014-07-28 NOTE — Progress Notes (Signed)
PROGRESS NOTE    Timothy Durham OHF:[id number] DOB: 24-Oct-1930 DOA: 07/19/2014 PCP: Dennis Bast, MD  HPI/Brief narrative 81 year old male with history of nonischemic cardiomyopathy with EF of 30-35% as per last echo, NSTEMI in 2015 with cardiac cath showing nonobstructive CAD, chronic kidney disease stage III, history of stroke in 2000, history of bioprosthetic aVR, paroxysmal A. fib on chronic Coumadin, tobacco use with? COPD who presented with progressive shortness of breath for the past 3 days associated with nonproductive cough and wheezing. He reports being compliant with his medications. Chest x-ray on admission showing moderate vascular congestion. Labs showed elevated BNP of 1400. Patient admitted for acute hypoxic respiratory failure due to acute bronchitis. During the hospital stay he became increasingly short of breath and wheezy with follow-up chest x-ray showing multifocal pneumonia and volume overload. Pulmonary, ID and cardiology were consulted. At this time exact etiology of his hypoxia and CT chest with diffuse bilateral consolidated airspace disease, is unclear. Consideration for bronchoscopy.   Assessment/Plan:  Acute hypoxic respiratory failure - Initially admitted for acute bronchitis but during the hospital course became increasingly short of breath - broadened antibiotic coverage with empiric vancomycin and cefepime for HCAP,O2 supplement/ duonebs/ oral prednisone.  - passed swallow eval on 4/27 with regular diet and thin liquid. - 4/29 reported worsening of sob, chest CT showed persistent patchy consolidative and ground glass opacities in all lobes, increased oxygen requirement, - pulmology and ID consulted. - Sputum sample recollected: Normal OP flora. RSV panel negative,  - ID recommended to stop abx due to clinical course not consistent with typical bacterial infection, vanc/cefepime d/ced on 4/29. Azithromycin discontinued 5/1. ID signed off 5/2  - As per cardiology  follow-up 5/2, doubt amiodarone toxicity since only started in December 2015. Cardiology have discussed with pulmonology and have stopped amiodarone for now. Continue steroids. - pulmonology to decide bronchoscopy - Personally viewed and appreciated chest x-ray results.  Nonischemic cardiomyopathy/acute on chronic systolic CHF - euvolemic on admission but developed some volume overload likely precipitated by acute respiratory failure.  - Continue Plavix, Coreg hydralazine, imdur and statin..  - Treated with IV Lasixand oral Aldactone. D/ced due to cr elevation. - Not on ACEi/ ARB due to CKD - Advanced heart failure team follow-up appreciated and do not believe that the primary process here is heart failure. Diurese as needed  Acute on chronic kidney disease stage III - Baseline creatinine around 1.8-2.  - [place] abd shows medical renal ds. Repeat ua no infection. Avoid renal toxin. Renal dosing meds. - Cr worsening, diuretic held since 4/30. - Creatinine is stable in the 2.1-2.2 range over the last 3 days.  History of CVA and peripheral vascular disease - PTA on Coumadin, Plavix and statin. Patient on both Coumadin and Plavix for A. fib (had TIA on Coumadin alone). - Anticoagulation held for possible bronchoscopy with biopsy  Atrial fibrillation - Pace rhythm, Rate controlled.  - Amiodarone discontinued 5/2 - Anticoagulation held for possible bronchoscopy and biopsy - Start heparin when INR <2 - INR on 5/2:2.25  GERD Continue PPI  Malnutrition,  - low albumin, nutrition consult obtained.  BPH/urinary retention  - of >600 on 5/1, in and out cathx1, increase flomax.  Chronic anemia - Stable  Leukocytosis - Possibly related to steroids    Code Status: DNR Family Communication: Discussed with patient's spouse and daughter at bedside. Disposition Plan: DC home when medically stable. Not medically stable yet for discharge. At risk for decline.   Consultants:  Advanced CHF  team  PCCM  ID-signed off 5/2  Procedures:  None  Antibiotics:  Azithromycin- completed   Subjective: States that his dyspnea has not significantly improved compared to admission. Leg swelling may be a little better.  Objective: Filed Vitals:   07/28/14 0809 07/28/14 0952 07/28/14 1415 07/28/14 1514  BP:  116/52  124/41  Pulse:  72  72  Temp:    97.5 F (36.4 C)  TempSrc:    Oral  Resp:  18  16  Height:      Weight:      SpO2: 96% 97% 95% 100%    Intake/Output Summary (Last 24 hours) at 07/28/14 1604 Last data filed at 07/28/14 1319  Gross per 24 hour  Intake   1400 ml  Output   1050 ml  Net    350 ml   Filed Weights   07/26/14 0451 07/27/14 0401 07/28/14 0530  Weight: 67.858 kg (149 lb 9.6 oz) 68.493 kg (151 lb) 68.584 kg (151 lb 3.2 oz)     Exam:  General exam: Pleasant elderly male sitting up comfortably in chair Respiratory system: Reduced breath sounds bilaterally, especially in the bases with scattered few bibasal crackles. No increased work of breathing. Cardiovascular system: S1 & S2 heard, RRR. No JVD, murmurs, gallops, clicks. 1+ pitting bilateral leg edema. Not on telemetry. Gastrointestinal system: Abdomen is nondistended, soft and nontender. Normal bowel sounds heard. Central nervous system: Alert and oriented. No focal neurological deficits. Extremities: Symmetric 5 x 5 power.   Data Reviewed: Basic Metabolic Panel:  Recent Labs Lab 07/24/14 0340 07/25/14 0431 07/26/14 0407 07/27/14 0539 07/28/14 0522  NA 133* 132* 133* 132* 134*  K 4.8 4.5 4.7 4.1 4.3  CL 95* 96 97 92* 95*  CO2 GLUCOSE 108* 104* 92 89 104*  BUN 69* 68* 87* 82* 82*  CREATININE 2.15* 2.02* 2.21* 2.26* 2.18*  CALCIUM 8.6 8.8 9.0 9.4 9.9  MG 2.2  --   --  2.2  --    Liver Function Tests:  Recent Labs Lab 07/26/14 0407 07/27/14 0539 07/28/14 0522  AST 42* 30 26  ALT 51 44 38  ALKPHOS 130* 126 123  BILITOT 1.8* 1.2 1.1  PROT 6.2 6.0* 6.3*    ALBUMIN 2.2* 2.1* 2.1*   No results for input(s): LIPASE, AMYLASE in the last 168 hours. No results for input(s): AMMONIA in the last 168 hours. CBC:  Recent Labs Lab 07/24/14 0340 07/25/14 0431 07/26/14 0407 07/28/14 0522  WBC 16.5* 16.7* 19.8* 19.6*  HGB 10.1* 9.8* 9.4* 9.7*  HCT 28.9* 27.6* 26.2* 27.5*  MCV 90.3 88.5 87.9 88.4  PLT 340 325 340 367   Cardiac Enzymes: No results for input(s): CKTOTAL, CKMB, CKMBINDEX, TROPONINI in the last 168 hours. BNP (last 3 results)  Recent Labs  02/24/14 1606 02/26/14 1623 03/06/14 1129  PROBNP 2973.0* 18948.0* 5943.0*   CBG: No results for input(s): GLUCAP in the last 168 hours.  Recent Results (from the past 240 hour(s))  Culture, blood (routine x 2)     Status: None   Collection Time: 07/20/14  2:10 AM  Result Value Ref Range Status   Specimen Description BLOOD RIGHT FOREARM  Final   Special Requests BOTTLES DRAWN AEROBIC AND ANAEROBIC 5CC EA  Final   Culture   Final    NO GROWTH 5 DAYS Performed at [facility]    Report Status 07/26/2014 FINAL  Final  Culture, blood (routine x 2)  Status: None   Collection Time: 07/20/14  2:19 AM  Result Value Ref Range Status   Specimen Description BLOOD LEFT FOREARM  Final   Special Requests BOTTLES DRAWN AEROBIC ONLY 4CC  Final   Culture   Final    NO GROWTH 5 DAYS Performed at [facility]    Report Status 07/26/2014 FINAL  Final  MRSA PCR Screening     Status: None   Collection Time: 07/20/14  4:45 AM  Result Value Ref Range Status   MRSA by PCR NEGATIVE NEGATIVE Final    Comment:        The GeneXpert MRSA Assay (FDA approved for NASAL specimens only), is one component of a comprehensive MRSA colonization surveillance program. It is not intended to diagnose MRSA infection nor to guide or monitor treatment for MRSA infections.   Culture, expectorated sputum-assessment     Status: None   Collection Time: 07/23/14 11:23 PM  Result Value Ref Range  Status   Specimen Description SPUTUM  Final   Special Requests NONE  Final   Sputum evaluation   Final    MICROSCOPIC FINDINGS SUGGEST THAT THIS SPECIMEN IS NOT REPRESENTATIVE OF LOWER RESPIRATORY SECRETIONS. PLEASE RECOLLECT. RESULT CALLED TO, READ BACK BY AND VERIFIED WITH: NOTIFIED G RUSUL,RN 07/24/14 AT 0040 BY RHOLMES   Report Status 07/24/2014 FINAL  Final  Culture, expectorated sputum-assessment     Status: None   Collection Time: 07/24/14  9:14 PM  Result Value Ref Range Status   Specimen Description SPUTUM  Final   Special Requests Normal  Final   Sputum evaluation   Final    MICROSCOPIC FINDINGS SUGGEST THAT THIS SPECIMEN IS NOT REPRESENTATIVE OF LOWER RESPIRATORY SECRETIONS. PLEASE RECOLLECT. CALLED TO D.HART ,RN AT 0124 BY L.PITT 07/25/14    Report Status 07/25/2014 FINAL  Final  Culture, expectorated sputum-assessment     Status: None   Collection Time: 07/25/14  3:42 PM  Result Value Ref Range Status   Specimen Description SPUTUM  Final   Special Requests Normal  Final   Sputum evaluation   Final    THIS SPECIMEN IS ACCEPTABLE. RESPIRATORY CULTURE REPORT TO FOLLOW.   Report Status 07/25/2014 FINAL  Final  Culture, respiratory (NON-Expectorated)     Status: None (Preliminary result)   Collection Time: 07/25/14  3:42 PM  Result Value Ref Range Status   Specimen Description SPUTUM  Final   Special Requests NONE  Final   Gram Stain PENDING  Incomplete   Culture   Final    NORMAL OROPHARYNGEAL FLORA Performed at [facility]    Report Status PENDING  Incomplete  Respiratory virus panel     Status: None   Collection Time: 07/25/14  8:03 PM  Result Value Ref Range Status   Respiratory Syncytial Virus A Negative Negative Final   Respiratory Syncytial Virus B Negative Negative Final   Influenza A Negative Negative Final   Influenza B Negative Negative Final   Parainfluenza 1 Negative Negative Final   Parainfluenza 2 Negative Negative Final   Parainfluenza 3  Negative Negative Final   Metapneumovirus Negative Negative Final   Rhinovirus Negative Negative Final   Adenovirus Negative Negative Final    Comment: (NOTE) Performed At: [facility] [address] [place], [place] [id number] Mila Homer MD WU:[id number]          Studies: Dg Chest 2 View  07/28/2014   CLINICAL DATA:  Respiratory failure .  EXAM: CHEST  2 VIEW  COMPARISON:  07/22/2014 .  CT 07/25/2014.  FINDINGS: Mediastinum and hilar structures are normal. Prior cardiac valve replacement. Cardiac pacer noted with lead tips in stable position. Persistent multifocal bilateral pulmonary infiltrates, no interim change. Small right pleural effusion. No pneumothorax.  IMPRESSION: 1. Persistent multifocal bilateral pulmonary infiltrates most consistent with pneumonia. No significant interim change.  2. Prior cardiac valve replacement. Cardiac pacer stable position. Stable cardiomegaly .   Electronically Signed   By: Maisie Fus  Register   On: 07/28/2014 08:33        Scheduled Meds: . allopurinol  200 mg Oral Daily  . atorvastatin  40 mg Oral QPM  . carvedilol  6.25 mg Oral BID WC  . DULoxetine  60 mg Oral Daily  . feeding supplement (PRO-STAT SUGAR FREE 64)  30 mL Oral BID  . hydrALAZINE  12.5 mg Oral BID  . ipratropium-albuterol  3 mL Nebulization TID  . isosorbide mononitrate  30 mg Oral Daily  . pantoprazole  80 mg Oral Daily  . predniSONE  20 mg Oral Q breakfast  . sodium chloride  3 mL Intravenous Q12H  . tamsulosin  0.8 mg Oral QHS  . Warfarin - Pharmacist Dosing Inpatient   Does not apply q1800   Continuous Infusions:   Principal Problem:   Acute respiratory failure Active Problems:   TIA 2007, Dec 2014 (Plavix added)   Coronary artery disease-moderate Oct 2015   CKD (chronic kidney disease), stage III   Moderate to Severe Mitral Regurgitation   PAD- LLE PTA, RCEA, attempted LICA stent   Tissue AVR 2006 (in [place])   NICM (nonischemic cardiomyopathy)   PAF    Hyperlipidemia   Chronic anticoagulation   Pacemaker- MDT BiV 02/17/14   Acute on chronic systolic CHF (congestive heart failure)   Chronic systolic heart failure   Acute renal failure superimposed on stage 3 chronic kidney disease   Pneumonia, organism unspecified    Time spent: 30 minutes    Lachele Lievanos, MD, FACP, FHM. [facility] Pager [phone]  If 7PM-7AM, please contact night-coverage [email or web address] Password [email or web address] 07/28/2014, 4:04 PM    LOS: 8 days

## 2014-07-28 NOTE — Progress Notes (Addendum)
Subjective:  Continued dyspnea and hypoxia with mobilization. Comfortable sitting in chair on O2.  Objective:  Vital Signs in the last 24 hours: Temp:  [97.5 F (36.4 C)-97.8 F (36.6 C)] 97.5 F (36.4 C) (05/02 0530) Pulse Rate:  [72-87] 72 (05/02 0952) Resp:  [18-20] 18 (05/02 0952) BP: (116-131)/(38-58) 116/52 mmHg (05/02 0952) SpO2:  [92 %-97 %] 97 % (05/02 0952) Weight:  [151 lb 3.2 oz (68.584 kg)] 151 lb 3.2 oz (68.584 kg) (05/02 0530)  Intake/Output from previous day:  Intake/Output Summary (Last 24 hours) at 07/28/14 1032 Last data filed at 07/28/14 0953  Gross per 24 hour  Intake   1423 ml  Output   1050 ml  Net    373 ml    Physical Exam: General appearance: alert, cooperative, cachectic, no distress and pale Neck: no JVD and RCA scar Lungs: scatter rhonchi Heart: regular rate and rhythm and 1/6 systolic murmur Abdomen: liver 2 fngers below RCM Extremities: 1+ pitting edema on Rt, trace on Lt Pulses: diminnished Skin: pale, cool, dry Neurologic: Grossly normal   Rate: 72  Rhythm: normal sinus rhythm and paced  Lab Results:  Recent Labs  07/26/14 0407 07/28/14 0522  WBC 19.8* 19.6*  HGB 9.4* 9.7*  PLT 340 367    Recent Labs  07/27/14 0539 07/28/14 0522  NA 132* 134*  K 4.1 4.3  CL 92* 95*  CO2 28 29  GLUCOSE 89 104*  BUN 82* 82*  CREATININE 2.26* 2.18*   No results for input(s): TROPONINI in the last 72 hours.  Invalid input(s): CK, MB  Recent Labs  07/28/14 0522  INR 2.25*    Scheduled Meds: . allopurinol  200 mg Oral Daily  . amiodarone  200 mg Oral Daily  . atorvastatin  40 mg Oral QPM  . carvedilol  6.25 mg Oral BID WC  . clopidogrel  75 mg Oral Daily  . DULoxetine  60 mg Oral Daily  . feeding supplement (PRO-STAT SUGAR FREE 64)  30 mL Oral BID  . hydrALAZINE  12.5 mg Oral BID  . ipratropium-albuterol  3 mL Nebulization TID  . isosorbide mononitrate  30 mg Oral Daily  . pantoprazole  80 mg Oral Daily  . predniSONE   20 mg Oral Q breakfast  . sodium chloride  3 mL Intravenous Q12H  . tamsulosin  0.8 mg Oral QHS  . warfarin  2 mg Oral q1800  . Warfarin - Pharmacist Dosing Inpatient   Does not apply q1800   Continuous Infusions:  PRN Meds:.acetaminophen, albuterol   Imaging: Dg Chest 2 View  07/28/2014   CLINICAL DATA:  Respiratory failure .  EXAM: CHEST  2 VIEW  COMPARISON:  07/22/2014 .  CT 07/25/2014.  FINDINGS: Mediastinum and hilar structures are normal. Prior cardiac valve replacement. Cardiac pacer noted with lead tips in stable position. Persistent multifocal bilateral pulmonary infiltrates, no interim change. Small right pleural effusion. No pneumothorax.  IMPRESSION: 1. Persistent multifocal bilateral pulmonary infiltrates most consistent with pneumonia. No significant interim change.  2. Prior cardiac valve replacement. Cardiac pacer stable position. Stable cardiomegaly .   Electronically Signed   By: Maisie Fus  Register   On: 07/28/2014 08:33    Cardiac Studies: Echo Oct 2015 EF 30-35% with tissue AVR and moderate to severe MR   Assessment/Plan:  81 yo male with a history of CVA in 2000 secondary to PAF- recurrent PAF Oct 2015, chronic anticoagulation now with Coumadin, nonischemic cardimoyopathy s/p [employer] CRT-P (02/17/14), chronic systolic  HF- last EF 30-35% Oct 2015, non-obstructive CAD at cath 2015, HTN, CKD stage IV, HLD, PAD and s/o bioprosthetic AVR in 2010 in [place]. He has been followed closely in HF clinic and recently doing quite well. Diuretics had to be cut back a bit due to hypotension. Weight has been stable around 154 pounds. He was admitted 07/19/14 with pneumonia and some degree of suspected superimposed heart failure. Initial his diuretics were held secondary to acute on chronic renal insufficiency but resumed on 4/29. He was seen by the pulmonary service and steroids added for pneumonitis. BNP was 2044 this am. I/O positive this admission 1.4 L. BUN/SCr looks to be about at baseline.  CHF has been asked to comment on his volume status as the pt has not made significant improvement to date.     Principal Problem:   Acute respiratory failure Active Problems:   Acute on chronic systolic CHF (congestive heart failure)   Acute renal failure superimposed on stage 3 chronic kidney disease   Pneumonia, organism unspecified   CKD (chronic kidney disease), stage III   Moderate to Severe Mitral Regurgitation   NICM (nonischemic cardiomyopathy)   PAF   Chronic anticoagulation   Pacemaker- MDT BiV 02/17/14   Chronic systolic heart failure   TIA 2007, Dec 2014 (Plavix added)   Coronary artery disease-moderate Oct 2015   PAD- LLE PTA, RCEA, attempted LICA stent   Tissue AVR 2006 (in [place])   Hyperlipidemia   PLAN: Will review with MD. He is currently not on a staning diuretic dose.   Briar Witherspoon PA-C 07/28/2014, 10:32 AM   Patient seen and examined independently. Corine Shelter, PA-C note reviewed carefully - agree with his assessment and plan. I have edited the note based on my findings.   The primary process here is not HF. Current volume status looks good. Can diurese further as needed.   I have reviewed his CT and CXRs personally. I have also reviewed CT images with Dr. Llana Aliment. CT with diffuse bilateral consolidated airspace disease.   He has been on amiodarone since 12/15. Doubt this is amio toxicity but after talking with Dr. Kendrick Fries we will go ahead and stop for now. Continue steroids. On coumadin and Plavix for AF (had TIA on coumadin alone). Will hold anticoagulation for possible bronch with biopsy. Start heparin when INR < 2.0.  Discussed with patient and his family.   Bensimhon, Daniel,MD 3:36 PM

## 2014-07-28 NOTE — Progress Notes (Signed)
INFECTIOUS DISEASE PROGRESS NOTE  ID: Timothy Durham is a 82 y.o. male with  Principal Problem:   Acute respiratory failure Active Problems:   TIA 2007, Dec 2014 (Plavix added)   Coronary artery disease-moderate Oct 2015   CKD (chronic kidney disease), stage III   Moderate to Severe Mitral Regurgitation   PAD- LLE PTA, RCEA, attempted LICA stent   Tissue AVR 2006 (in [place])   NICM (nonischemic cardiomyopathy)   PAF   Hyperlipidemia   Chronic anticoagulation   Pacemaker- MDT BiV 02/17/14   Acute on chronic systolic CHF (congestive heart failure)   Chronic systolic heart failure   Acute renal failure superimposed on stage 3 chronic kidney disease   Pneumonia, organism unspecified  Subjective: Still with cough prod of white sputum, he feels that this is coming from his upper airways and his lower congestion is unchanged.    Abtx:  Anti-infectives    Start     Dose/Rate Route Frequency Ordered Stop   07/25/14 1630  azithromycin (ZITHROMAX) tablet 250 mg     250 mg Oral Daily 07/25/14 1523 07/27/14 1118   07/22/14 1100  ceFEPIme (MAXIPIME) 1 g in dextrose 5 % 50 mL IVPB  Status:  Discontinued     1 g 100 mL/hr over 30 Minutes Intravenous Every 24 hours 07/22/14 1008 07/25/14 1523   07/22/14 1100  vancomycin (VANCOCIN) IVPB 1000 mg/200 mL premix  Status:  Discontinued     1,000 mg 200 mL/hr over 60 Minutes Intravenous Every 24 hours 07/22/14 1008 07/25/14 1523   07/22/14 1000  azithromycin (ZITHROMAX) tablet 500 mg  Status:  Discontinued     500 mg Oral Daily 07/22/14 0740 07/22/14 0939   07/22/14 0800  cefTRIAXone (ROCEPHIN) 1 g in dextrose 5 % 50 mL IVPB - Premix  Status:  Discontinued     1 g 100 mL/hr over 30 Minutes Intravenous Every 24 hours 07/22/14 0740 07/22/14 0939   07/21/14 1000  azithromycin (ZITHROMAX) tablet 250 mg  Status:  Discontinued     250 mg Oral Daily 07/20/14 0311 07/22/14 0740   07/20/14 0200  vancomycin (VANCOCIN) 1,500 mg in sodium chloride 0.9 % 500 mL  IVPB  Status:  Discontinued     1,500 mg 250 mL/hr over 120 Minutes Intravenous  Once 07/20/14 0145 07/20/14 0246   07/20/14 0200  ceFEPIme (MAXIPIME) 2 g in dextrose 5 % 50 mL IVPB  Status:  Discontinued     2 g 100 mL/hr over 30 Minutes Intravenous  Once 07/20/14 0145 07/20/14 0246   07/20/14 0200  azithromycin (ZITHROMAX) tablet 500 mg     500 mg Oral  Once 07/20/14 0145 07/20/14 0243      Medications:  Scheduled: . allopurinol  200 mg Oral Daily  . amiodarone  200 mg Oral Daily  . atorvastatin  40 mg Oral QPM  . carvedilol  6.25 mg Oral BID WC  . clopidogrel  75 mg Oral Daily  . DULoxetine  60 mg Oral Daily  . feeding supplement (PRO-STAT SUGAR FREE 64)  30 mL Oral BID  . hydrALAZINE  12.5 mg Oral BID  . ipratropium-albuterol  3 mL Nebulization TID  . isosorbide mononitrate  30 mg Oral Daily  . pantoprazole  80 mg Oral Daily  . predniSONE  20 mg Oral Q breakfast  . sodium chloride  3 mL Intravenous Q12H  . tamsulosin  0.8 mg Oral QHS  . warfarin  2 mg Oral q1800  . Warfarin - Pharmacist  Dosing Inpatient   Does not apply q1800    Objective: Vital signs in last 24 hours: Temp:  [97.5 F (36.4 C)-97.8 F (36.6 C)] 97.5 F (36.4 C) (05/02 0530) Pulse Rate:  [72-87] 72 (05/02 0952) Resp:  [18-20] 18 (05/02 0952) BP: (116-131)/(38-58) 116/52 mmHg (05/02 0952) SpO2:  [92 %-97 %] 97 % (05/02 0952) Weight:  [68.584 kg (151 lb 3.2 oz)] 68.584 kg (151 lb 3.2 oz) (05/02 0530)   General appearance: alert, cooperative and no distress Resp: rhonchi bilaterally Cardio: regular rate and rhythm GI: normal findings: bowel sounds normal and soft, non-tender Extremities: edema R > L.   Lab Results  Recent Labs  07/26/14 0407 07/27/14 0539 07/28/14 0522  WBC 19.8*  --  19.6*  HGB 9.4*  --  9.7*  HCT 26.2*  --  27.5*  NA 133* 132* 134*  K 4.7 4.1 4.3  CL 97 92* 95*  CO2 23 28 29   BUN 87* 82* 82*  CREATININE 2.21* 2.26* 2.18*   Liver Panel  Recent Labs   07/26/14 0407 07/27/14 0539 07/28/14 0522  PROT 6.2 6.0* 6.3*  ALBUMIN 2.2* 2.1* 2.1*  AST 42* 30 26  ALT 51 44 38  ALKPHOS 130* 126 123  BILITOT 1.8* 1.2 1.1  BILIDIR 0.5  --   --   IBILI 1.3*  --   --    Sedimentation Rate  Recent Labs  07/25/14 1419  ESRSEDRATE 134*   C-Reactive Protein No results for input(s): CRP in the last 72 hours.  Microbiology: Recent Results (from the past 240 hour(s))  Culture, blood (routine x 2)     Status: None   Collection Time: 07/20/14  2:10 AM  Result Value Ref Range Status   Specimen Description BLOOD RIGHT FOREARM  Final   Special Requests BOTTLES DRAWN AEROBIC AND ANAEROBIC 5CC EA  Final   Culture   Final    NO GROWTH 5 DAYS Performed at [facility]    Report Status 07/26/2014 FINAL  Final  Culture, blood (routine x 2)     Status: None   Collection Time: 07/20/14  2:19 AM  Result Value Ref Range Status   Specimen Description BLOOD LEFT FOREARM  Final   Special Requests BOTTLES DRAWN AEROBIC ONLY 4CC  Final   Culture   Final    NO GROWTH 5 DAYS Performed at [facility]    Report Status 07/26/2014 FINAL  Final  MRSA PCR Screening     Status: None   Collection Time: 07/20/14  4:45 AM  Result Value Ref Range Status   MRSA by PCR NEGATIVE NEGATIVE Final    Comment:        The GeneXpert MRSA Assay (FDA approved for NASAL specimens only), is one component of a comprehensive MRSA colonization surveillance program. It is not intended to diagnose MRSA infection nor to guide or monitor treatment for MRSA infections.   Culture, expectorated sputum-assessment     Status: None   Collection Time: 07/23/14 11:23 PM  Result Value Ref Range Status   Specimen Description SPUTUM  Final   Special Requests NONE  Final   Sputum evaluation   Final    MICROSCOPIC FINDINGS SUGGEST THAT THIS SPECIMEN IS NOT REPRESENTATIVE OF LOWER RESPIRATORY SECRETIONS. PLEASE RECOLLECT. RESULT CALLED TO, READ BACK BY AND VERIFIED WITH:  NOTIFIED G RUSUL,RN 07/24/14 AT 0040 BY RHOLMES   Report Status 07/24/2014 FINAL  Final  Culture, expectorated sputum-assessment     Status: None  Collection Time: 07/24/14  9:14 PM  Result Value Ref Range Status   Specimen Description SPUTUM  Final   Special Requests Normal  Final   Sputum evaluation   Final    MICROSCOPIC FINDINGS SUGGEST THAT THIS SPECIMEN IS NOT REPRESENTATIVE OF LOWER RESPIRATORY SECRETIONS. PLEASE RECOLLECT. CALLED TO D.HART ,RN AT 0124 BY L.PITT 07/25/14    Report Status 07/25/2014 FINAL  Final  Culture, expectorated sputum-assessment     Status: None   Collection Time: 07/25/14  3:42 PM  Result Value Ref Range Status   Specimen Description SPUTUM  Final   Special Requests Normal  Final   Sputum evaluation   Final    THIS SPECIMEN IS ACCEPTABLE. RESPIRATORY CULTURE REPORT TO FOLLOW.   Report Status 07/25/2014 FINAL  Final  Culture, respiratory (NON-Expectorated)     Status: None (Preliminary result)   Collection Time: 07/25/14  3:42 PM  Result Value Ref Range Status   Specimen Description SPUTUM  Final   Special Requests NONE  Final   Gram Stain PENDING  Incomplete   Culture   Final    NORMAL OROPHARYNGEAL FLORA Performed at [facility]    Report Status PENDING  Incomplete  Respiratory virus panel     Status: None   Collection Time: 07/25/14  8:03 PM  Result Value Ref Range Status   Respiratory Syncytial Virus A Negative Negative Final   Respiratory Syncytial Virus B Negative Negative Final   Influenza A Negative Negative Final   Influenza B Negative Negative Final   Parainfluenza 1 Negative Negative Final   Parainfluenza 2 Negative Negative Final   Parainfluenza 3 Negative Negative Final   Metapneumovirus Negative Negative Final   Rhinovirus Negative Negative Final   Adenovirus Negative Negative Final    Comment: (NOTE) Performed At: [facility] [address] [place], [place] [id number] Mila Homer MD OZ:[id number]      Studies/Results: Dg Chest 2 View  07/28/2014   CLINICAL DATA:  Respiratory failure .  EXAM: CHEST  2 VIEW  COMPARISON:  07/22/2014 .  CT 07/25/2014.  FINDINGS: Mediastinum and hilar structures are normal. Prior cardiac valve replacement. Cardiac pacer noted with lead tips in stable position. Persistent multifocal bilateral pulmonary infiltrates, no interim change. Small right pleural effusion. No pneumothorax.  IMPRESSION: 1. Persistent multifocal bilateral pulmonary infiltrates most consistent with pneumonia. No significant interim change.  2. Prior cardiac valve replacement. Cardiac pacer stable position. Stable cardiomegaly .   Electronically Signed   By: Maisie Fus  Register   On: 07/28/2014 08:33     Assessment/Plan: Diffuse lung infiltrates with respiratory failure (HIV-) NICM CKD 3 A fib GERD  Total days of antibiotics: stopped 5-1 Vanco/cefepime  azithro On amio, prednisone (day 9)  Does he need bronch? eval for DVT? His CT did not show PE and his findings would not be typical for PE.  Has received course of anbx.  Will be available as needed.          Johny Sax Infectious Diseases (pager) [phone] [email or web address] 07/28/2014, 12:23 PM  LOS: 8 days

## 2014-07-28 NOTE — Progress Notes (Signed)
Physical Therapy Treatment Patient Details Name: Timothy Durham MRN: [id number] DOB: December 13, 1930 Today's Date: 07/28/2014    History of Present Illness 81 y.o. male admitted with Acute hypoxic respiratory failure, and Nonischemic cardiomyopathy with acute exacerbation.    PT Comments    Pt admitted with above diagnosis. Pt currently with functional limitations due to balance and endurance deficits. Pt ambulated with RW with desaturation upon standing.  Continues to be limited by poor endurance and desaturation with little activity.  Will continue PT.   Pt will benefit from skilled PT to increase their independence and safety with mobility to allow discharge to the venue listed below.    Follow Up Recommendations  Home health PT;Supervision/Assistance - 24 hour     Equipment Recommendations  Rolling walker with 5" wheels (may need tub bench vs 3-in-1 - TBD)    Recommendations for Other Services       Precautions / Restrictions Precautions Precautions: Fall Restrictions Weight Bearing Restrictions: No    Mobility  Bed Mobility               General bed mobility comments: sitting in chair  Transfers Overall transfer level: Needs assistance Equipment used: Rolling walker (2 wheeled) Transfers: Sit to/from Stand Sit to Stand: Min guard         General transfer comment: Min guard for safety. Demonstrates minor sway but able to self correct.  Ambulation/Gait Ambulation/Gait assistance: Min guard Ambulation Distance (Feet): 40 Feet Assistive device: Rolling walker (2 wheeled) Gait Pattern/deviations: Step-through pattern;Decreased stride length;Decreased dorsiflexion - right Gait velocity: decreased Gait velocity interpretation: <1.8 ft/sec, indicative of risk for recurrent falls General Gait Details: Pt able to ambulate to door and back to chair.  Pt with good safety awareness overall with occasional cues to sequence steps and Rw.     Stairs             Wheelchair Mobility    Modified Rankin (Stroke Patients Only)       Balance Overall balance assessment: Needs assistance         Standing balance support: Bilateral upper extremity supported;During functional activity Standing balance-Leahy Scale: Fair Standing balance comment: can stand statically for seconds without UE support.                     Cognition Arousal/Alertness: Awake/alert Behavior During Therapy: WFL for tasks assessed/performed Overall Cognitive Status: Within Functional Limits for tasks assessed                      Exercises General Exercises - Lower Extremity Ankle Circles/Pumps: AROM;Both;10 reps;Seated Long Arc Quad: Strengthening;Both;10 reps;Seated Hip Flexion/Marching: Strengthening;Both;10 reps;Seated    General Comments        Pertinent Vitals/Pain Pain Assessment: No/denies pain  O2 at rest 94-96% on 4L.  Ambulated and O2 dropped to 78% on 4L.  After pt rested for 5 minutes, O2 at 4L 98%.      Home Living                      Prior Function            PT Goals (current goals can now be found in the care plan section) Progress towards PT goals: Progressing toward goals    Frequency  Min 3X/week    PT Plan Current plan remains appropriate    Co-evaluation             End of Session Equipment  Utilized During Treatment: Gait belt;Oxygen (4L) Activity Tolerance: Patient limited by fatigue Patient left: in chair;with call bell/phone within reach;with family/visitor present     Time: 0272-5366 PT Time Calculation (min) (ACUTE ONLY): 29 min  Charges:  $Gait Training: 8-22 mins $Therapeutic Exercise: 8-22 mins                    G Codes:      Timothy Durham, Timothy Durham F 2014-08-01, 1:18 PM  [facility] [phone] (pager)

## 2014-07-28 NOTE — Progress Notes (Addendum)
Name: Timothy Durham MRN: [id number] DOB: November 13, 1930    ADMISSION DATE:  07/19/2014 CONSULTATION DATE:  4/29  REFERRING MD :  Roda Shutters  CHIEF COMPLAINT:  Pneumonia not improving   BRIEF PATIENT DESCRIPTION:  81 year old male w/ known sig h/o systolic CM (EF 30-35%), CKD stage IV, prior AVR, and PAF. Admitted 4/23 w/ working dx of PNA w/ element of decompensated HF/pulmonary edema. Treated w/ broad spec abx and lasix until creatinine climbed and interrogation of optivol device on pacemaker suggested euvolemia on 4/27. 4/29 early evening suddenly more short of breath. CT chest obtained showing diffuse ground-glass opacities bilateral. PCCM asked to see re: "pneumonia not improving"   SIGNIFICANT EVENTS    STUDIES:  CT chest 4/29: diffuse patchy GG opacifications bilaterally, small bilateral pleural effusions.   SUBJECTIVE:  Not much better, still desaturating  VITAL SIGNS: Temp:  [97.5 F (36.4 C)-97.8 F (36.6 C)] 97.5 F (36.4 C) (05/02 1514) Pulse Rate:  [72-87] 72 (05/02 1514) Resp:  [16-20] 16 (05/02 1514) BP: (116-131)/(41-58) 124/41 mmHg (05/02 1514) SpO2:  [92 %-100 %] 100 % (05/02 1514) Weight:  [151 lb 3.2 oz (68.584 kg)] 151 lb 3.2 oz (68.584 kg) (05/02 0530)  PHYSICAL EXAMINATION: Gen: chronically ill appearing HENT: OP clear, TM's clear, neck supple PULM: Craackles bases bilaterally CV: Irreg irreg, no mgr, trace edema GI: BS+, soft, nontender Derm: no cyanosis or rash Psyche: normal mood and affect     Recent Labs Lab 07/26/14 0407 07/27/14 0539 07/28/14 0522  NA 133* 132* 134*  K 4.7 4.1 4.3  CL 97 92* 95*  CO2 BUN 87* 82* 82*  CREATININE 2.21* 2.26* 2.18*  GLUCOSE 92 89 104*    Recent Labs Lab 07/25/14 0431 07/26/14 0407 07/28/14 0522  HGB 9.8* 9.4* 9.7*  HCT 27.6* 26.2* 27.5*  WBC 16.7* 19.8* 19.6*  PLT 325 340 367   CT chest 4/29 images personally reviewed> bilateral patchy consolidation and ground glass in an upper lobe  distribution, food in esophagus and in stomach  ASSESSMENT / PLAN:  Acute hypoxic respiratory failure in setting of diffuse bilateral infiltrates.. This started after about 5 months of amiodarone and within 72 hours of exposure to some new pinewood furniture he assembled.  The illness was associated with a low grade temperature, bilateral infiltrates, scant clear sputum production, and dyspnea with new hypoxemia.  Nothing has really made him better here.  He also notes choking on food from time to time and was known to aspirate after his stroke in the early 2000s.  He notes food gets stuck from time to time.  Last week when he felt worse he was coughing up "ketchup".  DDx here is broad> amiodarone induced lung toxicity, hypersensitivity pneumonitis (should be better by now), less likely occult malignancy like bronchoalveolar cell carcinoma vs aspiration pneumonitis  Plan Barium swallow (look at esophagus) Continue reg diet with thin liquids per speech consult 4/27 Increase steroids again Stop amiodarone Hold warfarin and plavix in case he is not better then we can perform bronchoscopy after he has been off of plavix. Will repeat 2 view CXR on 5/4 and see him then Would walk for O2 assessment  Dysphagia: new problem to me, see above    NICM (EF 30-35%), CKD stage III, h/o CVA, atrial fib, and GERD Plan Per cardiac, ID and internal medicine teams.   Discussed with CHF team, they will hold amiodarone  Heber Thornburg, MD [facility] Pager: [phone] Cell: [phone]  If no response, call [phone]   07/28/2014, 5:15 PM

## 2014-07-28 NOTE — Progress Notes (Signed)
Anticoagulation CONSULT NOTE  Pharmacy Consult for Coumadin Indication: atrial fibrillation  Allergies  Allergen Reactions  . Ambien [Zolpidem] Other (See Comments)    Hallucinations  . Ativan [Lorazepam] Other (See Comments)    hallucinations  . Oxycontin [Oxycodone] Other (See Comments)    hallucinations  . Penicillins     Unknown childhood reaction   . Sulfa Antibiotics     unknown    Patient Measurements: Height: 5\' 10"  (177.8 cm) Weight: 151 lb 3.2 oz (68.584 kg) (scale B) IBW/kg (Calculated) : 73  Vital Signs: Temp: 97.5 F (36.4 C) (05/02 0530) Temp Source: Oral (05/02 0530) BP: 116/52 mmHg (05/02 0952) Pulse Rate: 72 (05/02 0952)  Labs:  Recent Labs  07/26/14 0407 07/27/14 0539 07/28/14 0522  HGB 9.4*  --  9.7*  HCT 26.2*  --  27.5*  PLT 340  --  367  LABPROT 25.7*  --  25.1*  INR 2.33*  --  2.25*  CREATININE 2.21* 2.26* 2.18*    Estimated Creatinine Clearance: 25.8 mL/min (by C-G formula based on Cr of 2.18).  Assessment: 81yo male to continue on pta coumadin for PAF (last pta dose 4/23). INR 2.99 on admit. Also on amio po pta. Warfarin held on 4/24 but restarted on 4/25. INR stable at therapeutic level of 2.2 from 4/30 on home dose. H/H low but stable, plt wnl and stable with no reported significant s/s bleeding.   PTA warfarin dose: 2mg  daily  Goal of Therapy:  INR 2-3   Plan:  - Continue home dose of warfarin 2 mg PO daily - Monitor PT/INR on MWF since INR has been stable on home dose - Follow for s/sx bleeding  Sheppard Coil PharmD., BCPS Clinical Pharmacist Pager [phone] 07/28/2014 12:50 PM

## 2014-07-29 ENCOUNTER — Inpatient Hospital Stay (HOSPITAL_COMMUNITY): Payer: Medicare HMO

## 2014-07-29 LAB — BASIC METABOLIC PANEL
ANION GAP: 11 (ref 5–15)
BUN: 79 mg/dL — ABNORMAL HIGH (ref 6–20)
CALCIUM: 9.5 mg/dL (ref 8.9–10.3)
CO2: 26 mmol/L (ref 22–32)
Chloride: 95 mmol/L — ABNORMAL LOW (ref 101–111)
Creatinine, Ser: 1.83 mg/dL — ABNORMAL HIGH (ref 0.61–1.24)
GFR, EST AFRICAN AMERICAN: 38 mL/min — AB (ref >60)
GFR, EST NON AFRICAN AMERICAN: 33 mL/min — AB (ref >60)
GLUCOSE: 143 mg/dL — AB (ref 70–99)
POTASSIUM: 4.5 mmol/L (ref 3.5–5.1)
SODIUM: 132 mmol/L — AB (ref 135–145)

## 2014-07-29 LAB — PROCALCITONIN: PROCALCITONIN: 0.15 ng/mL

## 2014-07-29 LAB — PROTIME-INR
INR: 2.24 — AB (ref 0.00–1.49)
PROTHROMBIN TIME: 24.9 s — AB (ref 11.6–15.2)

## 2014-07-29 MED ORDER — TORSEMIDE 20 MG PO TABS
20.0000 mg | ORAL_TABLET | Freq: Two times a day (BID) | ORAL | Status: DC
  Administered 2014-07-29 – 2014-08-01 (×7): 20 mg via ORAL
  Filled 2014-07-29 (×9): qty 1

## 2014-07-29 NOTE — Progress Notes (Signed)
Patient ID: Timothy Durham, male   DOB: 14-Apr-1930, 81 y.o.   MRN: [id number]   Mr. Donatelli is an 81 yo male with a history of prior CVA in 2000 secondary to PAF, nonischemic cardimoyopathy s/p [employer] CRT-P (02/17/14), chronic systolic HF (EF 30-35%), non-obstructive CAD, HTN, CKD stage IV, HLD, PAD and bioprosthetic AVR.   Followed closely in HF clinic and recently doing quite well. Diuretics had to be cut back a bit due to hypotension. Weight has been stable around 154 pounds.   Admitted 4/24 ago with worsening SOB with fevers and chills with WBC 12.2 and left shift. Weight stable. Initial CXR with mild vascular congestion. Felt to have acute bronchitis. However dyspnea got worse and CXR 4/26 showed worsening bilateral infiltrates ? HF vs multifocal PNA. IV Lasix and abx initially, has completed antibiotic course.  Diuretics have been on hold. Steroids increased.   Anticoagulation held for possible bronchoscopy.   CT chest with diffuse, bilateral consolidated airspace disease.    Scheduled Meds: . allopurinol  200 mg Oral Daily  . atorvastatin  40 mg Oral QPM  . carvedilol  6.25 mg Oral BID WC  . DULoxetine  60 mg Oral Daily  . feeding supplement (PRO-STAT SUGAR FREE 64)  30 mL Oral BID  . hydrALAZINE  12.5 mg Oral BID  . ipratropium-albuterol  3 mL Nebulization TID  . isosorbide mononitrate  30 mg Oral Daily  . methylPREDNISolone (SOLU-MEDROL) injection  80 mg Intravenous Q12H  . pantoprazole  80 mg Oral Daily  . sodium chloride  3 mL Intravenous Q12H  . tamsulosin  0.8 mg Oral QHS  . torsemide  20 mg Oral BID   Continuous Infusions:  PRN Meds:.acetaminophen, albuterol   Filed Vitals:   07/28/14 2039 07/28/14 2056 07/29/14 0432 07/29/14 0934  BP: 126/49  125/50 129/42  Pulse: 78  83 77  Temp:   97.6 F (36.4 C) 97.5 F (36.4 C)  TempSrc:   Oral Oral  Resp: Height:      Weight:   151 lb 7.3 oz (68.7 kg)   SpO2: 97% 98% 92% 97%    Intake/Output Summary (Last 24  hours) at 07/29/14 0953 Last data filed at 07/29/14 4098  Gross per 24 hour  Intake    810 ml  Output    477 ml  Net    333 ml    LABS: Basic Metabolic Panel:  Recent Labs  11/91/47 0539 07/28/14 0522 07/29/14 0600  NA 132* 134* 132*  K 4.1 4.3 4.5  CL 92* 95* 95*  CO2 GLUCOSE 89 104* 143*  BUN 82* 82* 79*  CREATININE 2.26* 2.18* 1.83*  CALCIUM 9.4 9.9 9.5  MG 2.2  --   --    Liver Function Tests:  Recent Labs  07/27/14 0539 07/28/14 0522  AST 30 26  ALT 44 38  ALKPHOS 126 123  BILITOT 1.2 1.1  PROT 6.0* 6.3*  ALBUMIN 2.1* 2.1*   No results for input(s): LIPASE, AMYLASE in the last 72 hours. CBC:  Recent Labs  07/28/14 0522  WBC 19.6*  HGB 9.7*  HCT 27.5*  MCV 88.4  PLT 367   Cardiac Enzymes: No results for input(s): CKTOTAL, CKMB, CKMBINDEX, TROPONINI in the last 72 hours. BNP: Invalid input(s): POCBNP D-Dimer: No results for input(s): DDIMER in the last 72 hours. Hemoglobin A1C: No results for input(s): HGBA1C in the last 72 hours. Fasting Lipid Panel: No results for input(s):  CHOL, HDL, LDLCALC, TRIG, CHOLHDL, LDLDIRECT in the last 72 hours. Thyroid Function Tests: No results for input(s): TSH, T4TOTAL, T3FREE, THYROIDAB in the last 72 hours.  Invalid input(s): FREET3 Anemia Panel: No results for input(s): VITAMINB12, FOLATE, FERRITIN, TIBC, IRON, RETICCTPCT in the last 72 hours.  RADIOLOGY: Dg Chest 2 View  07/28/2014   CLINICAL DATA:  Respiratory failure .  EXAM: CHEST  2 VIEW  COMPARISON:  07/22/2014 .  CT 07/25/2014.  FINDINGS: Mediastinum and hilar structures are normal. Prior cardiac valve replacement. Cardiac pacer noted with lead tips in stable position. Persistent multifocal bilateral pulmonary infiltrates, no interim change. Small right pleural effusion. No pneumothorax.  IMPRESSION: 1. Persistent multifocal bilateral pulmonary infiltrates most consistent with pneumonia. No significant interim change.  2. Prior cardiac valve  replacement. Cardiac pacer stable position. Stable cardiomegaly .   Electronically Signed   By: Maisie Fus  Register   On: 07/28/2014 08:33   Dg Chest 2 View  07/20/2014   CLINICAL DATA:  Shortness of breath.  Pacemaker.  EXAM: CHEST  2 VIEW  COMPARISON:  05/04/2014.  FINDINGS: Mildly increased cardiac silhouette. Triple lead pacer unchanged from priors. Moderate vascular congestion without overt failure or significant consolidation. Mild to moderate pleural effusions, greater on the RIGHT. Aortic valvular placement with prior median sternotomy. No acute osseous findings.  IMPRESSION: Cardiomegaly with stable appearance of the transvenous pacer.  Moderate vascular congestion without overt failure. Overall improved from prior radiograph in February.   Electronically Signed   By: Davonna Belling M.D.   On: 07/20/2014 02:23   Ct Chest Wo Contrast  07/25/2014   CLINICAL DATA:  Hypoxia and progressive shortness of breath for the past 3 days. Nonproductive cough and wheeze  EXAM: CT CHEST WITHOUT CONTRAST  TECHNIQUE: Multidetector CT imaging of the chest was performed following the standard protocol without IV contrast.  COMPARISON:  None.  FINDINGS: THORACIC INLET/BODY WALL:  There is a biventricular pacer from the left with leads in unremarkable position. No acute findings at the thoracic inlet.  There is an incidental 1 cm intramuscular lipoma in the right chest wall.  MEDIASTINUM:  Cardiomegaly which is chronic based on chest x-rays. No pericardial effusion. The patient is status post median sternotomy with aortic valve replacement. The ascending aorta measures 41 mm in diameter. There is mild enlargement of mediastinal lymph nodes which could be reactive to the presumed pneumonia or sequela of remote granulomatous disease (there is also scattered nodal coarse calcification). No acute vascular findings. Negative esophagus.  LUNG WINDOWS:  There is patchy consolidative and ground-glass opacities in all lobes. Airspace  disease is most extensive in the left upper lobe and most dense in the posterior segment right upper lobe. Small bilateral pleural effusions with interlobular septal thickening. No cavitation. No evidence of loculated effusion.  UPPER ABDOMEN:  Granulomatous changes in the liver and spleen.  OSSEOUS:  No acute findings.  IMPRESSION: 1. Multilobar pneumonia. 2. Superimposed interstitial edema and small pleural effusions. 3. Chronic and postoperative findings noted above.   Electronically Signed   By: Marnee Spring M.D.   On: 07/25/2014 08:00   Dg Esophagus  07/29/2014   CLINICAL DATA:  Dysphagia.  Choking.  Bilateral pneumonia.  EXAM: ESOPHOGRAM/BARIUM SWALLOW  TECHNIQUE: Combined double contrast and single contrast examination performed using effervescent crystals, thick barium liquid, and thin barium liquid.  FLUOROSCOPY TIME:  Radiation Exposure Index (as provided by the fluoroscopic device): Dose area product: 829.79uGy*m^2  COMPARISON:  Radiographs 07/28/2014.  Chest CT 07/25/2014.  FINDINGS: Pharyngeal phase of swallowing was normal. The study was carried out in the AP projection due to patient condition. Nonspecific esophageal dysmotility was present without stricture. 0/3 primary waves reach the gastroesophageal junction. Tertiary contractions were observed. Fetal on esophagus incidentally noted.  There is no bear ear passage of the 13 mm barium tablet challenge however the tablet remain static in the lower esophagus just proximal to the gastroesophageal junction for several min. No gastroesophageal reflux could be elicited with water siphon test.  Esophageal stasis was present in the supine position. Esophageal folds appeared within normal limits.  IMPRESSION: 1. No esophageal stricture or mass. 2. Nonspecific esophageal dysmotility disorder with with tertiary contractions. Stasis in the supine position.   Electronically Signed   By: Andreas Newport M.D.   On: 07/29/2014 09:39   US Renal  07/20/2014    CLINICAL DATA:  Acute kidney injury.  Hypertension.  EXAM: RENAL/URINARY TRACT ULTRASOUND COMPLETE  COMPARISON:  None.  FINDINGS: Right Kidney:  Length: 10.3 cm. Echogenicity within normal limits. No mass or hydronephrosis visualized. Diffuse cortical thinning with prominent renal sinus fat.  Left Kidney:  Length: 11.1 cm. Echogenicity within normal limits. No mass or hydronephrosis visualized. Diffuse cortical thinning with prominent renal sinus fat.  Bladder:  Multiple diverticula. The largest measures 4.9 cm in maximum diameter. Small amount of internal debris with no definite calculi  IMPRESSION: 1. Diffuse bilateral renal cortical atrophy. 2. Multiple bladder diverticula. 3. Small amount of debris in the bladder with no definite calculi.   Electronically Signed   By: Beckie Salts M.D.   On: 07/20/2014 12:27   Dg Chest Port 1 View  07/22/2014   CLINICAL DATA:  Acute onset of hypoxemia.  Initial encounter.  EXAM: PORTABLE CHEST - 1 VIEW  COMPARISON:  Chest radiograph performed 07/20/2014  FINDINGS: The lungs are well-aerated. Worsening bilateral midlung airspace opacification raises concern for multifocal pneumonia. Small bilateral pleural effusions are seen, and underlying interstitial edema cannot be excluded. Vascular congestion is noted. No pneumothorax is seen.  The cardiomediastinal silhouette is mildly enlarged. The patient is status post median sternotomy. A pacemaker is noted overlying the left chest wall, with leads ending overlying the right atrium and right ventricle. No acute osseous abnormalities are seen.  IMPRESSION: 1. Worsening bilateral mid lung airspace opacification raises concern for multifocal pneumonia. 2. Small bilateral pleural effusions, with vascular congestion and mild cardiomegaly. Underlying interstitial edema cannot be excluded.   Electronically Signed   By: Roanna Raider M.D.   On: 07/22/2014 04:30    PHYSICAL EXAM General: NAD. Sitting in chair Neck: JVP  6-7 cm, no  thyromegaly or thyroid nodule.  Lungs: + crackles Decreased breath sounds at bases bilaterally.   CV: Nondisplaced PMI.  Heart regular S1/S2, no S3/S4, 2/6 HSM LLSB/apex.  1+ ankle edema.   Abdomen: Soft, nontender, no hepatosplenomegaly, no distention.  Neurologic: Alert and oriented x 3.  Psych: Normal affect. Extremities: No clubbing or cyanosis.   TELEMETRY: Reviewed telemetry pt in NSR with BiV pacing    Assessment:   1. Acute respiratory failure: Suspect CHF + possible PNA 2. Acute on chronic systolic heart failure EF 30-35% 3. Acute bronchitis 4. CKD, stage III-IV 5. PAF in NSR on amio - CHDSVASC 5. On warfarin 6. Multifocal airspace disease: ?amiodarone toxicity versus hypersensitivity pneumonitis vs aspiration PNA.    Plan/Discussion:  From HF perspective he is stable. Volume status looks ok. Creatinine/BUN coming down.  Restart torsemide tomorrow 20 mg twice a day  today. Continue current dose of carvedilol, hydralazine, and imdur. No ACEI with CKD.   Workup of airspace disease per pulmonary.  Had barium swallow this morning to look for aspiration.  He is now off amiodarone for ?lung toxicity.  Consideration for bronch with biopsy => therefore Plavix and warfarin on hold.       Marca Ancona  07/29/2014 9:53 AM

## 2014-07-29 NOTE — Progress Notes (Signed)
PROGRESS NOTE    Timothy Durham VZC:[id number] DOB: [birth year] DOA: 07/19/2014 PCP: Dennis Bast, MD  HPI/Brief narrative 81 year old male with history of nonischemic cardiomyopathy with EF of 30-35% as per last echo, NSTEMI in 2015 with cardiac cath showing nonobstructive CAD, chronic kidney disease stage III, history of stroke in 2000, history of bioprosthetic aVR, paroxysmal A. fib on chronic Coumadin, tobacco use with? COPD who presented with progressive shortness of breath for the past 3 days associated with nonproductive cough and wheezing. He reports being compliant with his medications. Chest x-ray on admission showing moderate vascular congestion. Labs showed elevated BNP of 1400. Patient admitted for acute hypoxic respiratory failure due to acute bronchitis. During the hospital stay he became increasingly short of breath and wheezy with follow-up chest x-ray showing multifocal pneumonia and volume overload. Pulmonary, ID and cardiology were consulted. At this time exact etiology of his hypoxia and CT chest with diffuse bilateral consolidated airspace disease, is unclear. Consideration for bronchoscopy.   Assessment/Plan:  Acute hypoxic respiratory failure - Initially admitted for acute bronchitis but during the hospital course became increasingly short of breath - broadened antibiotic coverage with empiric vancomycin and cefepime for HCAP,O2 supplement/ duonebs/ oral prednisone.  - passed swallow eval on 4/27 with regular diet and thin liquid. - 4/29 reported worsening of sob, chest CT showed persistent patchy consolidative and ground glass opacities in all lobes, increased oxygen requirement, - pulmology and ID consulted. - Sputum sample recollected: Normal OP flora. RSV panel negative,  - ID recommended to stop abx due to clinical course not consistent with typical bacterial infection, vanc/cefepime d/ced on 4/29. Azithromycin discontinued 5/1. ID signed off 5/2  - As per cardiology  follow-up 5/2, doubt amiodarone toxicity since only started in December 2015. Cardiology have discussed with pulmonology and have stopped amiodarone for now. Continue steroids. - Pulmonology input from 5/2 appreciated: Broad differential diagnosis: Amiodarone lung toxicity, hypersensitivity pneumonitis, less likely occult malignancy like bronchoalveolar cell carcinoma versus aspiration pneumonitis. - Barium swallow: Some nonspecific esophageal dysmotility. - Steroids changed and Solu-Medrol started 5/2 - Plan is for repeat chest x-ray 5/4 and reassess regarding bronchoscopy. - Patient still requires high flow oxygen with minimal activity - Torsemide resumed by cardiology on 5/3.   Non-ischemic cardiomyopathy/acute on chronic systolic CHF - euvolemic on admission but developed some volume overload likely precipitated by acute respiratory failure.  - Continue Coreg hydralazine, imdur and statin..  - Treated with IV Lasixand oral Aldactone. D/ced due to cr elevation. - Not on ACEi/ ARB due to CKD - Advanced heart failure team follow-up appreciated and do not believe that the primary process here is heart failure. Diurese as needed - Coumadin (last dose 5/1) and Plavix (last dose 5/2) held for possible bronchoscopy.  - Torsemide resumed 5/3   Acute on chronic kidney disease stage III - Baseline creatinine around 1.8-2.  - [place] abd shows medical renal ds. Repeat ua no infection. Avoid renal toxin. Renal dosing meds. - Cr worsening, diuretic held since 4/30. - creatinine has improved from 2.18 > 1.8. - Follow creatinine while diuretics have been resumed.   History of CVA and peripheral vascular disease - PTA on Coumadin, Plavix and statin. Patient on both Coumadin and Plavix for A. fib (had TIA on Coumadin alone). - Anticoagulation held for possible bronchoscopy with biopsy  Atrial fibrillation - Pace rhythm, Rate controlled.  - Amiodarone discontinued 5/2 - Anticoagulation held for  possible bronchoscopy and biopsy - Start heparin when INR <2 - INR on 5/3:  2.24  GERD Continue PPI  Malnutrition,  - low albumin, nutrition consult obtained.  BPH/urinary retention  - of >600 on 5/1, in and out cathx1, increased flomax.  Chronic anemia - Stable  Leukocytosis - Possibly related to steroids    Code Status: DNR Family Communication: Discussed with patient's spouse and daughter at bedside. Disposition Plan: DC home when medically stable. Not medically stable yet for discharge. At risk for decline.   Consultants:  Advanced CHF team  PCCM  ID-signed off 5/2  Procedures:  None  Antibiotics:  Azithromycin- completed   Subjective: States that his dyspnea may be slightly better. Denies any other complaints.   Objective: Filed Vitals:   07/29/14 0432 07/29/14 0934 07/29/14 1349 07/29/14 1514  BP: 125/50 129/42 116/46   Pulse: 83 77 76   Temp: 97.6 F (36.4 C) 97.5 F (36.4 C) 97.7 F (36.5 C)   TempSrc: Oral Oral Oral   Resp: 21 18 18    Height:      Weight: 68.7 kg (151 lb 7.3 oz)     SpO2: 92% 97% 99% 94%    Intake/Output Summary (Last 24 hours) at 07/29/14 1739 Last data filed at 07/29/14 1718  Gross per 24 hour  Intake    830 ml  Output   1302 ml  Net   -472 ml   Filed Weights   07/27/14 0401 07/28/14 0530 07/29/14 0432  Weight: 68.493 kg (151 lb) 68.584 kg (151 lb 3.2 oz) 68.7 kg (151 lb 7.3 oz)     Exam:  General exam: Pleasant elderly male sitting up comfortably in chair Respiratory system: Reduced breath sounds bilaterally, especially in the bases with scattered few bibasal crackles. No increased work of breathing. Cardiovascular system: S1 & S2 heard, RRR. No JVD, murmurs, gallops, clicks. Trace bilateral leg edema. Not on telemetry. Gastrointestinal system: Abdomen is nondistended, soft and nontender. Normal bowel sounds heard. Central nervous system: Alert and oriented. No focal neurological deficits. Extremities:  Symmetric 5 x 5 power.   Data Reviewed: Basic Metabolic Panel:  Recent Labs Lab 07/24/14 0340 07/25/14 0431 07/26/14 0407 07/27/14 0539 07/28/14 0522 07/29/14 0600  NA 133* 132* 133* 132* 134* 132*  K 4.8 4.5 4.7 4.1 4.3 4.5  CL 95* 96 97 92* 95* 95*  CO2 23 24 23 28 29 26   GLUCOSE 108* 104* 92 89 104* 143*  BUN 69* 68* 87* 82* 82* 79*  CREATININE 2.15* 2.02* 2.21* 2.26* 2.18* 1.83*  CALCIUM 8.6 8.8 9.0 9.4 9.9 9.5  MG 2.2  --   --  2.2  --   --    Liver Function Tests:  Recent Labs Lab 07/26/14 0407 07/27/14 0539 07/28/14 0522  AST 42* 30 26  ALT 51 44 38  ALKPHOS 130* 126 123  BILITOT 1.8* 1.2 1.1  PROT 6.2 6.0* 6.3*  ALBUMIN 2.2* 2.1* 2.1*   No results for input(s): LIPASE, AMYLASE in the last 168 hours. No results for input(s): AMMONIA in the last 168 hours. CBC:  Recent Labs Lab 07/24/14 0340 07/25/14 0431 07/26/14 0407 07/28/14 0522  WBC 16.5* 16.7* 19.8* 19.6*  HGB 10.1* 9.8* 9.4* 9.7*  HCT 28.9* 27.6* 26.2* 27.5*  MCV 90.3 88.5 87.9 88.4  PLT 340 325 340 367   Cardiac Enzymes: No results for input(s): CKTOTAL, CKMB, CKMBINDEX, TROPONINI in the last 168 hours. BNP (last 3 results)  Recent Labs  02/24/14 1606 02/26/14 1623 03/06/14 1129  PROBNP 2973.0* 18948.0* 5943.0*   CBG: No results for input(s):  GLUCAP in the last 168 hours.  Recent Results (from the past 240 hour(s))  Culture, blood (routine x 2)     Status: None   Collection Time: 07/20/14  2:10 AM  Result Value Ref Range Status   Specimen Description BLOOD RIGHT FOREARM  Final   Special Requests BOTTLES DRAWN AEROBIC AND ANAEROBIC 5CC EA  Final   Culture   Final    NO GROWTH 5 DAYS Performed at [facility]    Report Status 07/26/2014 FINAL  Final  Culture, blood (routine x 2)     Status: None   Collection Time: 07/20/14  2:19 AM  Result Value Ref Range Status   Specimen Description BLOOD LEFT FOREARM  Final   Special Requests BOTTLES DRAWN AEROBIC ONLY 4CC   Final   Culture   Final    NO GROWTH 5 DAYS Performed at [facility]    Report Status 07/26/2014 FINAL  Final  MRSA PCR Screening     Status: None   Collection Time: 07/20/14  4:45 AM  Result Value Ref Range Status   MRSA by PCR NEGATIVE NEGATIVE Final    Comment:        The GeneXpert MRSA Assay (FDA approved for NASAL specimens only), is one component of a comprehensive MRSA colonization surveillance program. It is not intended to diagnose MRSA infection nor to guide or monitor treatment for MRSA infections.   Culture, expectorated sputum-assessment     Status: None   Collection Time: 07/23/14 11:23 PM  Result Value Ref Range Status   Specimen Description SPUTUM  Final   Special Requests NONE  Final   Sputum evaluation   Final    MICROSCOPIC FINDINGS SUGGEST THAT THIS SPECIMEN IS NOT REPRESENTATIVE OF LOWER RESPIRATORY SECRETIONS. PLEASE RECOLLECT. RESULT CALLED TO, READ BACK BY AND VERIFIED WITH: NOTIFIED G RUSUL,RN 07/24/14 AT 0040 BY RHOLMES   Report Status 07/24/2014 FINAL  Final  Culture, expectorated sputum-assessment     Status: None   Collection Time: 07/24/14  9:14 PM  Result Value Ref Range Status   Specimen Description SPUTUM  Final   Special Requests Normal  Final   Sputum evaluation   Final    MICROSCOPIC FINDINGS SUGGEST THAT THIS SPECIMEN IS NOT REPRESENTATIVE OF LOWER RESPIRATORY SECRETIONS. PLEASE RECOLLECT. CALLED TO D.HART ,RN AT 0124 BY L.PITT 07/25/14    Report Status 07/25/2014 FINAL  Final  Culture, expectorated sputum-assessment     Status: None   Collection Time: 07/25/14  3:42 PM  Result Value Ref Range Status   Specimen Description SPUTUM  Final   Special Requests Normal  Final   Sputum evaluation   Final    THIS SPECIMEN IS ACCEPTABLE. RESPIRATORY CULTURE REPORT TO FOLLOW.   Report Status 07/25/2014 FINAL  Final  Culture, respiratory (NON-Expectorated)     Status: None (Preliminary result)   Collection Time: 07/25/14  3:42 PM    Result Value Ref Range Status   Specimen Description SPUTUM  Final   Special Requests NONE  Final   Gram Stain PENDING  Incomplete   Culture   Final    NORMAL OROPHARYNGEAL FLORA Performed at [facility]    Report Status PENDING  Incomplete  Respiratory virus panel     Status: None   Collection Time: 07/25/14  8:03 PM  Result Value Ref Range Status   Respiratory Syncytial Virus A Negative Negative Final   Respiratory Syncytial Virus B Negative Negative Final   Influenza A Negative Negative Final  Influenza B Negative Negative Final   Parainfluenza 1 Negative Negative Final   Parainfluenza 2 Negative Negative Final   Parainfluenza 3 Negative Negative Final   Metapneumovirus Negative Negative Final   Rhinovirus Negative Negative Final   Adenovirus Negative Negative Final    Comment: (NOTE) Performed At: [facility] [address] [place], [place] [id number] Mila Homer MD JX:[id number]          Studies: Dg Chest 2 View  07/28/2014   CLINICAL DATA:  Respiratory failure .  EXAM: CHEST  2 VIEW  COMPARISON:  07/22/2014 .  CT 07/25/2014.  FINDINGS: Mediastinum and hilar structures are normal. Prior cardiac valve replacement. Cardiac pacer noted with lead tips in stable position. Persistent multifocal bilateral pulmonary infiltrates, no interim change. Small right pleural effusion. No pneumothorax.  IMPRESSION: 1. Persistent multifocal bilateral pulmonary infiltrates most consistent with pneumonia. No significant interim change.  2. Prior cardiac valve replacement. Cardiac pacer stable position. Stable cardiomegaly .   Electronically Signed   By: Maisie Fus  Register   On: 07/28/2014 08:33   Dg Esophagus  07/29/2014   CLINICAL DATA:  Dysphagia.  Choking.  Bilateral pneumonia.  EXAM: ESOPHOGRAM/BARIUM SWALLOW  TECHNIQUE: Combined double contrast and single contrast examination performed using effervescent crystals, thick barium liquid, and thin barium liquid.   FLUOROSCOPY TIME:  Radiation Exposure Index (as provided by the fluoroscopic device): Dose area product: 829.79uGy*m^2  COMPARISON:  Radiographs 07/28/2014.  Chest CT 07/25/2014.  FINDINGS: Pharyngeal phase of swallowing was normal. The study was carried out in the AP projection due to patient condition. Nonspecific esophageal dysmotility was present without stricture. 0/3 primary waves reach the gastroesophageal junction. Tertiary contractions were observed. Fetal on esophagus incidentally noted.  There is no bear ear passage of the 13 mm barium tablet challenge however the tablet remain static in the lower esophagus just proximal to the gastroesophageal junction for several min. No gastroesophageal reflux could be elicited with water siphon test.  Esophageal stasis was present in the supine position. Esophageal folds appeared within normal limits.  IMPRESSION: 1. No esophageal stricture or mass. 2. Nonspecific esophageal dysmotility disorder with with tertiary contractions. Stasis in the supine position.   Electronically Signed   By: Andreas Newport M.D.   On: 07/29/2014 09:39        Scheduled Meds: . allopurinol  200 mg Oral Daily  . atorvastatin  40 mg Oral QPM  . carvedilol  6.25 mg Oral BID WC  . DULoxetine  60 mg Oral Daily  . feeding supplement (PRO-STAT SUGAR FREE 64)  30 mL Oral BID  . hydrALAZINE  12.5 mg Oral BID  . ipratropium-albuterol  3 mL Nebulization TID  . isosorbide mononitrate  30 mg Oral Daily  . methylPREDNISolone (SOLU-MEDROL) injection  80 mg Intravenous Q12H  . pantoprazole  80 mg Oral Daily  . sodium chloride  3 mL Intravenous Q12H  . tamsulosin  0.8 mg Oral QHS  . torsemide  20 mg Oral BID   Continuous Infusions:   Principal Problem:   Acute respiratory failure Active Problems:   TIA 2007, Dec 2014 (Plavix added)   Coronary artery disease-moderate Oct 2015   CKD (chronic kidney disease), stage III   Moderate to Severe Mitral Regurgitation   PAD- LLE PTA,  RCEA, attempted LICA stent   Tissue AVR 2006 (in [place])   NICM (nonischemic cardiomyopathy)   PAF   Hyperlipidemia   Chronic anticoagulation   Pacemaker- MDT BiV 02/17/14   Acute on chronic systolic  CHF (congestive heart failure)   Chronic systolic heart failure   Acute renal failure superimposed on stage 3 chronic kidney disease   Pneumonia, organism unspecified   Acute respiratory failure with hypoxia   Abnormal CT scan, chest    Time spent: 30 minutes    Shya Kovatch, MD, FACP, FHM. [facility] Pager [phone]  If 7PM-7AM, please contact night-coverage [email or web address] Password [email or web address] 07/29/2014, 5:39 PM    LOS: 9 days

## 2014-07-29 NOTE — Progress Notes (Signed)
CARDIAC REHAB PHASE I   PRE:  Rate/Rhythm: not on tele   BP:  Supine:   Sitting: 130/58  Standing:    SaO2: 95%4L  MODE:  Ambulation:  Bathroom and then 16  Ft, rested and sat and then 16 ft back   POST:  Rate/Rhythm: 72  BP:  Supine:   Sitting: 130/60  Standing:    SaO2: 75% 4L at 16 ft, sat on rollator and with rest and 6L 89%, 84% 6L room [id number] Pt not able to tolerate activity even on 6L. Used rollator and started out on 4L as stated above. Desat even with 6L. Pt is not appropriate for our services at this time. Will follow his progress with PT. Pt's RN aware of pt's tolerance of ex. Getting respiratory treatment when I left.  Luetta Nutting, RN BSN  07/29/2014 3:22 PM

## 2014-07-30 ENCOUNTER — Inpatient Hospital Stay (HOSPITAL_COMMUNITY): Payer: Medicare HMO

## 2014-07-30 DIAGNOSIS — R197 Diarrhea, unspecified: Secondary | ICD-10-CM

## 2014-07-30 LAB — BASIC METABOLIC PANEL
Anion gap: 12 (ref 5–15)
BUN: 86 mg/dL — ABNORMAL HIGH (ref 6–20)
CALCIUM: 9.3 mg/dL (ref 8.9–10.3)
CO2: 30 mmol/L (ref 22–32)
Chloride: 92 mmol/L — ABNORMAL LOW (ref 101–111)
Creatinine, Ser: 1.91 mg/dL — ABNORMAL HIGH (ref 0.61–1.24)
GFR, EST AFRICAN AMERICAN: 36 mL/min — AB (ref >60)
GFR, EST NON AFRICAN AMERICAN: 31 mL/min — AB (ref >60)
Glucose, Bld: 124 mg/dL — ABNORMAL HIGH (ref 70–99)
POTASSIUM: 4.8 mmol/L (ref 3.5–5.1)
SODIUM: 134 mmol/L — AB (ref 135–145)

## 2014-07-30 LAB — CBC
HCT: 29.6 % — ABNORMAL LOW (ref 39.0–52.0)
Hemoglobin: 10.2 g/dL — ABNORMAL LOW (ref 13.0–17.0)
MCH: 31.3 pg (ref 26.0–34.0)
MCHC: 34.5 g/dL (ref 30.0–36.0)
MCV: 90.8 fL (ref 78.0–100.0)
Platelets: 375 10*3/uL (ref 150–400)
RBC: 3.26 MIL/uL — ABNORMAL LOW (ref 4.22–5.81)
RDW: 16.9 % — ABNORMAL HIGH (ref 11.5–15.5)
WBC: 21.7 10*3/uL — ABNORMAL HIGH (ref 4.0–10.5)

## 2014-07-30 LAB — CLOSTRIDIUM DIFFICILE BY PCR: Toxigenic C. Difficile by PCR: NEGATIVE

## 2014-07-30 LAB — PROTIME-INR
INR: 1.96 — AB (ref 0.00–1.49)
PROTHROMBIN TIME: 22.5 s — AB (ref 11.6–15.2)

## 2014-07-30 MED ORDER — HEPARIN (PORCINE) IN NACL 100-0.45 UNIT/ML-% IJ SOLN
950.0000 [IU]/h | INTRAMUSCULAR | Status: DC
  Administered 2014-07-30 – 2014-08-04 (×5): 950 [IU]/h via INTRAVENOUS
  Filled 2014-07-30 (×9): qty 250

## 2014-07-30 NOTE — Progress Notes (Signed)
Name: Timothy Durham MRN: [id number] DOB: 12/12/1930    ADMISSION DATE:  07/19/2014 CONSULTATION DATE:  4/29  REFERRING MD :  Roda Shutters  CHIEF COMPLAINT:  Pneumonia not improving   BRIEF PATIENT DESCRIPTION:  81 year old male w/ known sig h/o systolic CM (EF 30-35%), CKD stage IV, prior AVR, and PAF. Admitted 4/23 w/ working dx of PNA w/ element of decompensated HF/pulmonary edema. Treated w/ broad spec abx and lasix until creatinine climbed and interrogation of optivol device on pacemaker suggested euvolemia on 4/27. 4/29 early evening suddenly more short of breath. CT chest obtained showing diffuse ground-glass opacities bilateral. PCCM asked to see re: "pneumonia not improving"   SIGNIFICANT EVENTS    STUDIES:  CT chest 4/29: diffuse patchy GG opacifications bilaterally, small bilateral pleural effusions.  Barium swallow 5/3>>> No esophageal stricture or mass, nonspecific esophageal dysmotility, Stasis in the supine position.  SUBJECTIVE:  Still easily desaturating on 4-6L.  Attempted ambulation with PT yesterday, sats 70's with minimal activity.   VITAL SIGNS: Temp:  [97.7 F (36.5 C)-98.5 F (36.9 C)] 97.7 F (36.5 C) (05/04 0540) Pulse Rate:  [69-93] 73 (05/04 0953) Resp:  [16-18] 18 (05/04 0953) BP: (116-125)/(39-46) 122/42 mmHg (05/04 0953) SpO2:  [94 %-99 %] 98 % (05/04 0953) Weight:  [150 lb 8 oz (68.266 kg)] 150 lb 8 oz (68.266 kg) (05/04 0540)  PHYSICAL EXAMINATION: Gen: chronically ill appearing, male NAD  HENT: OP clear, TM's clear, neck supple PULM: resps even, no labored on 5L [place], few scattered crackles, otherwise clear  CV: Irreg irreg, no mgr, trace edema GI: BS+, soft, nontender Derm: no cyanosis or rash Psyche: normal mood and affect     Recent Labs Lab 07/28/14 0522 07/29/14 0600 07/30/14 0548  NA 134* 132* 134*  K 4.3 4.5 4.8  CL 95* 95* 92*  CO2 29 26 30   BUN 82* 79* 86*  CREATININE 2.18* 1.83* 1.91*  GLUCOSE 104* 143* 124*    Recent Labs Lab  07/26/14 0407 07/28/14 0522 07/30/14 0548  HGB 9.4* 9.7* 10.2*  HCT 26.2* 27.5* 29.6*  WBC 19.8* 19.6* 21.7*  PLT 340 367 375   CT chest 4/29 images personally reviewed> bilateral patchy consolidation and ground glass in an upper lobe distribution, food in esophagus and in stomach  ASSESSMENT / PLAN:  Acute hypoxic respiratory failure in setting of diffuse bilateral infiltrates.. This started after about 5 months of amiodarone and within 72 hours of exposure to some new pinewood furniture he assembled.  The illness was associated with a low grade temperature, bilateral infiltrates, scant clear sputum production, and dyspnea with new hypoxemia.  Nothing has really made him better here.  He also notes choking on food from time to time and was known to aspirate after his stroke in the early 2000s.  He notes food gets stuck from time to time.  Last week when he felt worse he was coughing up "ketchup".  DDx here is broad> amiodarone induced lung toxicity, hypersensitivity pneumonitis (should be better by now), less likely occult malignancy like bronchoalveolar cell carcinoma vs aspiration pneumonitis  CXR with some improvement 5/4  Plan Cont supplemental O2 and titrate as able  Cont steroids  Diuresis as able per cards  Cont hold amiodarone Cont hold warfarin, plavix  Ok to start heparin gtt once INR <2 IF no FOB planned this am -- will d/w Dr. Kendrick Fries  F/u CXR  Would walk for O2 assessment HR control   Dysphagia:  Plan  Nonspecific  esophageal dysmotility on barium swallow  Cont diet per speech recs   NICM (EF 30-35%), CKD stage III, h/o CVA, atrial fib, and GERD Plan Per cardiac, ID and internal medicine teams.    Dirk Dress, NP 07/30/2014  10:54 AM Pager: [phone] or [phone]

## 2014-07-30 NOTE — Progress Notes (Signed)
Anticoagulation CONSULT NOTE  Pharmacy Consult for heparin Indication: atrial fibrillation  Allergies  Allergen Reactions  . Ambien [Zolpidem] Other (See Comments)    Hallucinations  . Ativan [Lorazepam] Other (See Comments)    hallucinations  . Oxycontin [Oxycodone] Other (See Comments)    hallucinations  . Penicillins     Unknown childhood reaction   . Sulfa Antibiotics     unknown    Patient Measurements: Height: 5\' 10"  (177.8 cm) Weight: 150 lb 8 oz (68.266 kg) (Scale B) IBW/kg (Calculated) : 73  Vital Signs: Temp: 97.7 F (36.5 C) (05/04 0540) Temp Source: Axillary (05/04 0540) BP: 113/36 mmHg (05/04 1330) Pulse Rate: 68 (05/04 1330)  Labs:  Recent Labs  07/28/14 0522 07/29/14 0600 07/29/14 1110 07/30/14 0548  HGB 9.7*  --   --  10.2*  HCT 27.5*  --   --  29.6*  PLT 367  --   --  375  LABPROT 25.1*  --  24.9* 22.5*  INR 2.25*  --  2.24* 1.96*  CREATININE 2.18* 1.83*  --  1.91*    Estimated Creatinine Clearance: 29.3 mL/min (by C-G formula based on Cr of 1.91).  Assessment: 81yo male on pta coumadin for PAF to start heparin as coumadin on hold for possible procedure.  INR this am 1.96.  Hg low but stable, no bleeding reported  PTA warfarin dose: 2mg  daily  Goal of Therapy:  Heparin level 0.3-0.7 units/ml   Plan:  - Start heparin drip at 950 units/hr with no bolus -Check heparin level 8 hours after start - Follow for s/sx bleeding  Thanks for allowing pharmacy to be a part of this patient's care.  Talbert Cage, PharmD Clinical Pharmacist, [phone] 07/30/2014 4:16 PM

## 2014-07-30 NOTE — Progress Notes (Signed)
The patient's VS were stable overnight and he did not have any complaints.  He remained A&Ox4 and did go for his chest X-ray this morning.

## 2014-07-30 NOTE — Progress Notes (Signed)
Patient ID: Timothy Durham, male   DOB: [birth year], 81 y.o.   MRN: [id number]   Timothy Durham is an 81 yo male with a history of prior CVA in 2000 secondary to PAF, nonischemic cardimoyopathy s/p [employer] CRT-P (02/17/14), chronic systolic HF (EF 30-35%), non-obstructive CAD, HTN, CKD stage IV, HLD, PAD and bioprosthetic AVR.   Followed closely in HF clinic and recently doing quite well. Diuretics had to be cut back a bit due to hypotension. Weight has been stable around 154 pounds.   Admitted 4/24 ago with worsening SOB with fevers and chills with WBC 12.2 and left shift. Weight stable. Initial CXR with mild vascular congestion. Felt to have acute bronchitis. However dyspnea got worse and CXR 4/26 showed worsening bilateral infiltrates ? HF vs multifocal PNA. IV Lasix and abx initially, has completed antibiotic course.  Steroids increased.   He feels marginally better today, able to walk more.  Torsemide restarted.   Anticoagulation held for possible bronchoscopy.   CT chest with diffuse, bilateral consolidated airspace disease.    Barium swallow with "nonspecific esophageal dysmotility disorder."   Scheduled Meds: . allopurinol  200 mg Oral Daily  . atorvastatin  40 mg Oral QPM  . carvedilol  6.25 mg Oral BID WC  . DULoxetine  60 mg Oral Daily  . feeding supplement (PRO-STAT SUGAR FREE 64)  30 mL Oral BID  . hydrALAZINE  12.5 mg Oral BID  . ipratropium-albuterol  3 mL Nebulization TID  . isosorbide mononitrate  30 mg Oral Daily  . methylPREDNISolone (SOLU-MEDROL) injection  80 mg Intravenous Q12H  . pantoprazole  80 mg Oral Daily  . sodium chloride  3 mL Intravenous Q12H  . tamsulosin  0.8 mg Oral QHS  . torsemide  20 mg Oral BID   Continuous Infusions:  PRN Meds:.acetaminophen, albuterol   Filed Vitals:   07/29/14 2210 07/30/14 0540 07/30/14 0933 07/30/14 0953  BP: 123/45 125/39  122/42  Pulse: 70 69  73  Temp: 98.5 F (36.9 C) 97.7 F (36.5 C)    TempSrc:  Axillary    Resp:  Height:      Weight:  150 lb 8 oz (68.266 kg)    SpO2: 98% 96% 94% 98%    Intake/Output Summary (Last 24 hours) at 07/30/14 1139 Last data filed at 07/30/14 0952  Gross per 24 hour  Intake   1540 ml  Output   1226 ml  Net    314 ml    LABS: Basic Metabolic Panel:  Recent Labs  40/98/11 0600 07/30/14 0548  NA 132* 134*  K 4.5 4.8  CL 95* 92*  CO2 26 30  GLUCOSE 143* 124*  BUN 79* 86*  CREATININE 1.83* 1.91*  CALCIUM 9.5 9.3   Liver Function Tests:  Recent Labs  07/28/14 0522  AST 26  ALT 38  ALKPHOS 123  BILITOT 1.1  PROT 6.3*  ALBUMIN 2.1*   No results for input(s): LIPASE, AMYLASE in the last 72 hours. CBC:  Recent Labs  07/28/14 0522 07/30/14 0548  WBC 19.6* 21.7*  HGB 9.7* 10.2*  HCT 27.5* 29.6*  MCV 88.4 90.8  PLT 367 375   Cardiac Enzymes: No results for input(s): CKTOTAL, CKMB, CKMBINDEX, TROPONINI in the last 72 hours. BNP: Invalid input(s): POCBNP D-Dimer: No results for input(s): DDIMER in the last 72 hours. Hemoglobin A1C: No results for input(s): HGBA1C in the last 72 hours. Fasting Lipid Panel: No results for input(s): CHOL, HDL, LDLCALC, TRIG,  CHOLHDL, LDLDIRECT in the last 72 hours. Thyroid Function Tests: No results for input(s): TSH, T4TOTAL, T3FREE, THYROIDAB in the last 72 hours.  Invalid input(s): FREET3 Anemia Panel: No results for input(s): VITAMINB12, FOLATE, FERRITIN, TIBC, IRON, RETICCTPCT in the last 72 hours.  RADIOLOGY: Dg Chest 2 View  07/30/2014   CLINICAL DATA:  Hypoxia.  Weakness.  EXAM: CHEST  2 VIEW  COMPARISON:  07/28/2014.  FINDINGS: There is improvement from 07/28/2014, with partial clearance of the multifocal bilateral airspace opacities although significant opacities persist, left greater than right. Mild right lateral base pleural thickening persists. No significant effusion is evident. There is moderate unchanged cardiomegaly. There is prior sternotomy and aortic valvuloplasty. There are intact  appearances of the transvenous leads.  IMPRESSION: Slight improvement. Multifocal bilateral airspace opacities persist, partially cleared.   Electronically Signed   By: Ellery Plunk M.D.   On: 07/30/2014 06:50   Dg Chest 2 View  07/28/2014   CLINICAL DATA:  Respiratory failure .  EXAM: CHEST  2 VIEW  COMPARISON:  07/22/2014 .  CT 07/25/2014.  FINDINGS: Mediastinum and hilar structures are normal. Prior cardiac valve replacement. Cardiac pacer noted with lead tips in stable position. Persistent multifocal bilateral pulmonary infiltrates, no interim change. Small right pleural effusion. No pneumothorax.  IMPRESSION: 1. Persistent multifocal bilateral pulmonary infiltrates most consistent with pneumonia. No significant interim change.  2. Prior cardiac valve replacement. Cardiac pacer stable position. Stable cardiomegaly .   Electronically Signed   By: Maisie Fus  Register   On: 07/28/2014 08:33   Dg Chest 2 View  07/20/2014   CLINICAL DATA:  Shortness of breath.  Pacemaker.  EXAM: CHEST  2 VIEW  COMPARISON:  05/04/2014.  FINDINGS: Mildly increased cardiac silhouette. Triple lead pacer unchanged from priors. Moderate vascular congestion without overt failure or significant consolidation. Mild to moderate pleural effusions, greater on the RIGHT. Aortic valvular placement with prior median sternotomy. No acute osseous findings.  IMPRESSION: Cardiomegaly with stable appearance of the transvenous pacer.  Moderate vascular congestion without overt failure. Overall improved from prior radiograph in February.   Electronically Signed   By: Davonna Belling M.D.   On: 07/20/2014 02:23   Ct Chest Wo Contrast  07/25/2014   CLINICAL DATA:  Hypoxia and progressive shortness of breath for the past 3 days. Nonproductive cough and wheeze  EXAM: CT CHEST WITHOUT CONTRAST  TECHNIQUE: Multidetector CT imaging of the chest was performed following the standard protocol without IV contrast.  COMPARISON:  None.  FINDINGS: THORACIC  INLET/BODY WALL:  There is a biventricular pacer from the left with leads in unremarkable position. No acute findings at the thoracic inlet.  There is an incidental 1 cm intramuscular lipoma in the right chest wall.  MEDIASTINUM:  Cardiomegaly which is chronic based on chest x-rays. No pericardial effusion. The patient is status post median sternotomy with aortic valve replacement. The ascending aorta measures 41 mm in diameter. There is mild enlargement of mediastinal lymph nodes which could be reactive to the presumed pneumonia or sequela of remote granulomatous disease (there is also scattered nodal coarse calcification). No acute vascular findings. Negative esophagus.  LUNG WINDOWS:  There is patchy consolidative and ground-glass opacities in all lobes. Airspace disease is most extensive in the left upper lobe and most dense in the posterior segment right upper lobe. Small bilateral pleural effusions with interlobular septal thickening. No cavitation. No evidence of loculated effusion.  UPPER ABDOMEN:  Granulomatous changes in the liver and spleen.  OSSEOUS:  No  acute findings.  IMPRESSION: 1. Multilobar pneumonia. 2. Superimposed interstitial edema and small pleural effusions. 3. Chronic and postoperative findings noted above.   Electronically Signed   By: Marnee Spring M.D.   On: 07/25/2014 08:00   Dg Esophagus  07/29/2014   CLINICAL DATA:  Dysphagia.  Choking.  Bilateral pneumonia.  EXAM: ESOPHOGRAM/BARIUM SWALLOW  TECHNIQUE: Combined double contrast and single contrast examination performed using effervescent crystals, thick barium liquid, and thin barium liquid.  FLUOROSCOPY TIME:  Radiation Exposure Index (as provided by the fluoroscopic device): Dose area product: 829.79uGy*m^2  COMPARISON:  Radiographs 07/28/2014.  Chest CT 07/25/2014.  FINDINGS: Pharyngeal phase of swallowing was normal. The study was carried out in the AP projection due to patient condition. Nonspecific esophageal dysmotility was  present without stricture. 0/3 primary waves reach the gastroesophageal junction. Tertiary contractions were observed. Fetal on esophagus incidentally noted.  There is no bear ear passage of the 13 mm barium tablet challenge however the tablet remain static in the lower esophagus just proximal to the gastroesophageal junction for several min. No gastroesophageal reflux could be elicited with water siphon test.  Esophageal stasis was present in the supine position. Esophageal folds appeared within normal limits.  IMPRESSION: 1. No esophageal stricture or mass. 2. Nonspecific esophageal dysmotility disorder with with tertiary contractions. Stasis in the supine position.   Electronically Signed   By: Andreas Newport M.D.   On: 07/29/2014 09:39   US Renal  07/20/2014   CLINICAL DATA:  Acute kidney injury.  Hypertension.  EXAM: RENAL/URINARY TRACT ULTRASOUND COMPLETE  COMPARISON:  None.  FINDINGS: Right Kidney:  Length: 10.3 cm. Echogenicity within normal limits. No mass or hydronephrosis visualized. Diffuse cortical thinning with prominent renal sinus fat.  Left Kidney:  Length: 11.1 cm. Echogenicity within normal limits. No mass or hydronephrosis visualized. Diffuse cortical thinning with prominent renal sinus fat.  Bladder:  Multiple diverticula. The largest measures 4.9 cm in maximum diameter. Small amount of internal debris with no definite calculi  IMPRESSION: 1. Diffuse bilateral renal cortical atrophy. 2. Multiple bladder diverticula. 3. Small amount of debris in the bladder with no definite calculi.   Electronically Signed   By: Beckie Salts M.D.   On: 07/20/2014 12:27   Dg Chest Port 1 View  07/22/2014   CLINICAL DATA:  Acute onset of hypoxemia.  Initial encounter.  EXAM: PORTABLE CHEST - 1 VIEW  COMPARISON:  Chest radiograph performed 07/20/2014  FINDINGS: The lungs are well-aerated. Worsening bilateral midlung airspace opacification raises concern for multifocal pneumonia. Small bilateral pleural  effusions are seen, and underlying interstitial edema cannot be excluded. Vascular congestion is noted. No pneumothorax is seen.  The cardiomediastinal silhouette is mildly enlarged. The patient is status post median sternotomy. A pacemaker is noted overlying the left chest wall, with leads ending overlying the right atrium and right ventricle. No acute osseous abnormalities are seen.  IMPRESSION: 1. Worsening bilateral mid lung airspace opacification raises concern for multifocal pneumonia. 2. Small bilateral pleural effusions, with vascular congestion and mild cardiomegaly. Underlying interstitial edema cannot be excluded.   Electronically Signed   By: Roanna Raider M.D.   On: 07/22/2014 04:30    PHYSICAL EXAM General: NAD. Sitting in chair Neck: JVP  6-7 cm, no thyromegaly or thyroid nodule.  Lungs: + crackles Decreased breath sounds at bases bilaterally.   CV: Nondisplaced PMI.  Heart regular S1/S2, no S3/S4, 2/6 HSM LLSB/apex.  1+ ankle edema.   Abdomen: Soft, nontender, no hepatosplenomegaly, no distention.  Neurologic:  Alert and oriented x 3.  Psych: Normal affect. Extremities: No clubbing or cyanosis.   TELEMETRY: Reviewed telemetry Timothy Durham in NSR with BiV pacing    Assessment:   1. Acute respiratory failure: Suspect CHF + possible PNA 2. Acute on chronic systolic heart failure EF 30-35% 3. Acute bronchitis 4. CKD, stage III-IV 5. PAF in NSR on amio - CHDSVASC 5. On warfarin 6. Multifocal airspace disease: ?amiodarone toxicity versus hypersensitivity pneumonitis vs aspiration PNA.    Plan/Discussion:  From HF perspective he is stable. Volume status looks ok. He has restarted torsemide 20 mg bid. Continue current dose of carvedilol, hydralazine, and imdur. No ACEI with CKD.   Workup of airspace disease per pulmonary.  He is now off amiodarone for ?lung toxicity.  Consideration for bronch with biopsy => therefore Plavix and warfarin on hold.  If no bronchoscopy today, would start  heparin gtt.  If no bronch at all, restart warfarin and Plavix.       Marca Ancona  07/30/2014 11:39 AM

## 2014-07-30 NOTE — Progress Notes (Signed)
PROGRESS NOTE    Timothy Durham ZOX:[id number] DOB: 07-31-1930 DOA: 07/19/2014 PCP: Dennis Bast, MD  HPI/Brief narrative 81 year old male with history of nonischemic cardiomyopathy with EF of 30-35% as per last echo, NSTEMI in 2015 with cardiac cath showing nonobstructive CAD, chronic kidney disease stage III, history of stroke in 2000, history of bioprosthetic aVR, paroxysmal A. fib on chronic Coumadin, tobacco use with? COPD who presented with progressive shortness of breath for the past 3 days associated with nonproductive cough and wheezing. He reports being compliant with his medications. Chest x-ray on admission showing moderate vascular congestion. Labs showed elevated BNP of 1400. Patient admitted for acute hypoxic respiratory failure due to acute bronchitis. During the hospital stay he became increasingly short of breath and wheezy with follow-up chest x-ray showing multifocal pneumonia and volume overload. Pulmonary, ID and cardiology were consulted. At this time exact etiology of his hypoxia and CT chest with diffuse bilateral consolidated airspace disease, is unclear. Consideration for bronchoscopy.   Assessment/Plan:  Acute hypoxic respiratory failure - Initially admitted for acute bronchitis but during the hospital course became increasingly short of breath - broadened antibiotic coverage with empiric vancomycin and cefepime for HCAP,O2 supplement/ duonebs/ oral prednisone.  - passed swallow eval on 4/27 with regular diet and thin liquid. - 4/29 reported worsening of sob, chest CT showed persistent patchy consolidative and ground glass opacities in all lobes, increased oxygen requirement, - pulmology and ID consulted. - Sputum sample recollected: Normal OP flora. RSV panel negative,  - ID recommended to stop abx due to clinical course not consistent with typical bacterial infection, vanc/cefepime d/ced on 4/29. Azithromycin discontinued 5/1. ID signed off 5/2  - As per cardiology  follow-up 5/2, doubt amiodarone toxicity since only started in December 2015. Cardiology have discussed with pulmonology and have stopped amiodarone for now. Continue steroids. - Pulmonology input from 5/2 appreciated: Broad differential diagnosis: Amiodarone lung toxicity, hypersensitivity pneumonitis, less likely occult malignancy like bronchoalveolar cell carcinoma versus aspiration pneumonitis. - Barium swallow: Some nonspecific esophageal dysmotility. - Steroids changed and Solu-Medrol started 5/2 - Patient still requires high flow oxygen with minimal activity - Torsemide resumed by cardiology on 5/3. - Chest x-ray slightly better. - As per pulmonology follow-up 5/4: Risk of bronchoscopy outweighs benefit and hence plans to follow daily chest x-ray, keep off of amiodarone for 6 weeks, treated with prednisone for 6 weeks and if no improvement over that course or if worsens then consider bronchoscopy - Discussed with Dr. Kendrick Fries: Hold off starting Coumadin or Plavix until reevaluation in AM - Will start Heparin gtt  Non-ischemic cardiomyopathy/acute on chronic systolic CHF - euvolemic on admission but developed some volume overload likely precipitated by acute respiratory failure.  - Continue Coreg hydralazine, imdur and statin..  - Treated with IV Lasixand oral Aldactone. D/ced due to cr elevation. - Not on ACEi/ ARB due to CKD - Advanced heart failure team follow-up appreciated and do not believe that the primary process here is heart failure. Diurese as needed - Coumadin (last dose 5/1) and Plavix (last dose 5/2) held for possible bronchoscopy.  - Torsemide resumed 5/3   Acute on chronic kidney disease stage III - Baseline creatinine around 1.8-2.  - [place] abd shows medical renal ds. Repeat ua no infection. Avoid renal toxin. Renal dosing meds. - Cr worsening, diuretic held since 4/30. - creatinine has improved from 2.18 > 1.8 >1.9. - Follow creatinine while diuretics have been  resumed.   History of CVA and peripheral vascular disease - PTA  on Coumadin, Plavix and statin. Patient on both Coumadin and Plavix for A. fib (had TIA on Coumadin alone). - Anticoagulation held for possible bronchoscopy with biopsy  Atrial fibrillation - Pace rhythm, Rate controlled.  - Amiodarone discontinued 5/2 - Anticoagulation held for possible bronchoscopy and biopsy - Start heparin when INR <2 - INR on 5/3: 2.24  GERD Continue PPI  Malnutrition,  - low albumin, nutrition consult obtained.  BPH/urinary retention  - of >600 on 5/1, in and out cathx1, increased flomax.  Chronic anemia - Stable  Leukocytosis - Possibly related to steroids  Diarrhea - Check C. difficile PCR  Code Status: DNR Family Communication: Discussed with patient's spouse and daughter at bedside on 5/3. Disposition Plan: DC home when medically stable. Not medically stable yet for discharge. At risk for decline.   Consultants:  Advanced CHF team  PCCM  ID-signed off 5/2  Procedures:  None  Antibiotics:  Azithromycin- completed   Subjective: Dyspnea without significant change. Complained of 2 episodes of diarrhea this morning.  Objective: Filed Vitals:   07/30/14 0933 07/30/14 0953 07/30/14 1330 07/30/14 1500  BP:  122/42 113/36   Pulse:  73 68   Temp:      TempSrc:      Resp:  18 18   Height:      Weight:      SpO2: 94% 98% 97% 98%    Intake/Output Summary (Last 24 hours) at 07/30/14 1544 Last data filed at 07/30/14 1334  Gross per 24 hour  Intake   1520 ml  Output   1227 ml  Net    293 ml   Filed Weights   07/28/14 0530 07/29/14 0432 07/30/14 0540  Weight: 68.584 kg (151 lb 3.2 oz) 68.7 kg (151 lb 7.3 oz) 68.266 kg (150 lb 8 oz)     Exam:  General exam: Pleasant elderly male sitting up comfortably in chair Respiratory system: Reduced breath sounds bilaterally, especially in the bases with scattered few bibasal crackles. No increased work of breathing. No  significant change over the last couple of days. Cardiovascular system: S1 & S2 heard, RRR. No JVD, murmurs, gallops, clicks. Trace bilateral leg edema. Not on telemetry. Gastrointestinal system: Abdomen is nondistended, soft and nontender. Normal bowel sounds heard. Central nervous system: Alert and oriented. No focal neurological deficits. Extremities: Symmetric 5 x 5 power.   Data Reviewed: Basic Metabolic Panel:  Recent Labs Lab 07/24/14 0340  07/26/14 0407 07/27/14 0539 07/28/14 0522 07/29/14 0600 07/30/14 0548  NA 133*  < > 133* 132* 134* 132* 134*  K 4.8  < > 4.7 4.1 4.3 4.5 4.8  CL 95*  < > 97 92* 95* 95* 92*  CO2 23  < > GLUCOSE 108*  < > 92 89 104* 143* 124*  BUN 69*  < > 87* 82* 82* 79* 86*  CREATININE 2.15*  < > 2.21* 2.26* 2.18* 1.83* 1.91*  CALCIUM 8.6  < > 9.0 9.4 9.9 9.5 9.3  MG 2.2  --   --  2.2  --   --   --   < > = values in this interval not displayed. Liver Function Tests:  Recent Labs Lab 07/26/14 0407 07/27/14 0539 07/28/14 0522  AST 42* 30 26  ALT 51 44 38  ALKPHOS 130* 126 123  BILITOT 1.8* 1.2 1.1  PROT 6.2 6.0* 6.3*  ALBUMIN 2.2* 2.1* 2.1*   No results for input(s): LIPASE, AMYLASE in the last 168  hours. No results for input(s): AMMONIA in the last 168 hours. CBC:  Recent Labs Lab 07/24/14 0340 07/25/14 0431 07/26/14 0407 07/28/14 0522 07/30/14 0548  WBC 16.5* 16.7* 19.8* 19.6* 21.7*  HGB 10.1* 9.8* 9.4* 9.7* 10.2*  HCT 28.9* 27.6* 26.2* 27.5* 29.6*  MCV 90.3 88.5 87.9 88.4 90.8  PLT 340 325 340 367 375   Cardiac Enzymes: No results for input(s): CKTOTAL, CKMB, CKMBINDEX, TROPONINI in the last 168 hours. BNP (last 3 results)  Recent Labs  02/24/14 1606 02/26/14 1623 03/06/14 1129  PROBNP 2973.0* 18948.0* 5943.0*   CBG: No results for input(s): GLUCAP in the last 168 hours.  Recent Results (from the past 240 hour(s))  Culture, expectorated sputum-assessment     Status: None   Collection Time: 07/23/14  11:23 PM  Result Value Ref Range Status   Specimen Description SPUTUM  Final   Special Requests NONE  Final   Sputum evaluation   Final    MICROSCOPIC FINDINGS SUGGEST THAT THIS SPECIMEN IS NOT REPRESENTATIVE OF LOWER RESPIRATORY SECRETIONS. PLEASE RECOLLECT. RESULT CALLED TO, READ BACK BY AND VERIFIED WITH: NOTIFIED G RUSUL,RN 07/24/14 AT 0040 BY RHOLMES   Report Status 07/24/2014 FINAL  Final  Culture, expectorated sputum-assessment     Status: None   Collection Time: 07/24/14  9:14 PM  Result Value Ref Range Status   Specimen Description SPUTUM  Final   Special Requests Normal  Final   Sputum evaluation   Final    MICROSCOPIC FINDINGS SUGGEST THAT THIS SPECIMEN IS NOT REPRESENTATIVE OF LOWER RESPIRATORY SECRETIONS. PLEASE RECOLLECT. CALLED TO D.HART ,RN AT 0124 BY L.PITT 07/25/14    Report Status 07/25/2014 FINAL  Final  Culture, expectorated sputum-assessment     Status: None   Collection Time: 07/25/14  3:42 PM  Result Value Ref Range Status   Specimen Description SPUTUM  Final   Special Requests Normal  Final   Sputum evaluation   Final    THIS SPECIMEN IS ACCEPTABLE. RESPIRATORY CULTURE REPORT TO FOLLOW.   Report Status 07/25/2014 FINAL  Final  Culture, respiratory (NON-Expectorated)     Status: None (Preliminary result)   Collection Time: 07/25/14  3:42 PM  Result Value Ref Range Status   Specimen Description SPUTUM  Final   Special Requests NONE  Final   Gram Stain PENDING  Incomplete   Culture   Final    NORMAL OROPHARYNGEAL FLORA Performed at [facility]    Report Status PENDING  Incomplete  Respiratory virus panel     Status: None   Collection Time: 07/25/14  8:03 PM  Result Value Ref Range Status   Respiratory Syncytial Virus A Negative Negative Final   Respiratory Syncytial Virus B Negative Negative Final   Influenza A Negative Negative Final   Influenza B Negative Negative Final   Parainfluenza 1 Negative Negative Final   Parainfluenza 2 Negative  Negative Final   Parainfluenza 3 Negative Negative Final   Metapneumovirus Negative Negative Final   Rhinovirus Negative Negative Final   Adenovirus Negative Negative Final    Comment: (NOTE) Performed At: [facility] [address] [place], [place] [id number] Mila Homer MD WU:[id number]          Studies: Dg Chest 2 View  07/30/2014   CLINICAL DATA:  Hypoxia.  Weakness.  EXAM: CHEST  2 VIEW  COMPARISON:  07/28/2014.  FINDINGS: There is improvement from 07/28/2014, with partial clearance of the multifocal bilateral airspace opacities although significant opacities persist, left greater than right. Mild  right lateral base pleural thickening persists. No significant effusion is evident. There is moderate unchanged cardiomegaly. There is prior sternotomy and aortic valvuloplasty. There are intact appearances of the transvenous leads.  IMPRESSION: Slight improvement. Multifocal bilateral airspace opacities persist, partially cleared.   Electronically Signed   By: Ellery Plunk M.D.   On: 07/30/2014 06:50   Dg Esophagus  07/29/2014   CLINICAL DATA:  Dysphagia.  Choking.  Bilateral pneumonia.  EXAM: ESOPHOGRAM/BARIUM SWALLOW  TECHNIQUE: Combined double contrast and single contrast examination performed using effervescent crystals, thick barium liquid, and thin barium liquid.  FLUOROSCOPY TIME:  Radiation Exposure Index (as provided by the fluoroscopic device): Dose area product: 829.79uGy*m^2  COMPARISON:  Radiographs 07/28/2014.  Chest CT 07/25/2014.  FINDINGS: Pharyngeal phase of swallowing was normal. The study was carried out in the AP projection due to patient condition. Nonspecific esophageal dysmotility was present without stricture. 0/3 primary waves reach the gastroesophageal junction. Tertiary contractions were observed. Fetal on esophagus incidentally noted.  There is no bear ear passage of the 13 mm barium tablet challenge however the tablet remain static in the lower  esophagus just proximal to the gastroesophageal junction for several min. No gastroesophageal reflux could be elicited with water siphon test.  Esophageal stasis was present in the supine position. Esophageal folds appeared within normal limits.  IMPRESSION: 1. No esophageal stricture or mass. 2. Nonspecific esophageal dysmotility disorder with with tertiary contractions. Stasis in the supine position.   Electronically Signed   By: Andreas Newport M.D.   On: 07/29/2014 09:39        Scheduled Meds: . allopurinol  200 mg Oral Daily  . atorvastatin  40 mg Oral QPM  . carvedilol  6.25 mg Oral BID WC  . DULoxetine  60 mg Oral Daily  . feeding supplement (PRO-STAT SUGAR FREE 64)  30 mL Oral BID  . hydrALAZINE  12.5 mg Oral BID  . ipratropium-albuterol  3 mL Nebulization TID  . isosorbide mononitrate  30 mg Oral Daily  . methylPREDNISolone (SOLU-MEDROL) injection  80 mg Intravenous Q12H  . pantoprazole  80 mg Oral Daily  . sodium chloride  3 mL Intravenous Q12H  . tamsulosin  0.8 mg Oral QHS  . torsemide  20 mg Oral BID   Continuous Infusions:   Principal Problem:   Acute respiratory failure Active Problems:   TIA 2007, Dec 2014 (Plavix added)   Coronary artery disease-moderate Oct 2015   CKD (chronic kidney disease), stage III   Moderate to Severe Mitral Regurgitation   PAD- LLE PTA, RCEA, attempted LICA stent   Tissue AVR 2006 (in [place])   NICM (nonischemic cardiomyopathy)   PAF   Hyperlipidemia   Chronic anticoagulation   Pacemaker- MDT BiV 02/17/14   Acute on chronic systolic CHF (congestive heart failure)   Chronic systolic heart failure   Acute renal failure superimposed on stage 3 chronic kidney disease   Pneumonia, organism unspecified   Acute respiratory failure with hypoxia   Abnormal CT scan, chest    Time spent: 20 minutes    HONGALGI,ANAND, MD, FACP, FHM. [facility] Pager [phone]  If 7PM-7AM, please contact night-coverage [email or web address] Password  [email or web address] 07/30/2014, 3:44 PM    LOS: 10 days

## 2014-07-30 NOTE — Progress Notes (Signed)
Physical Therapy Treatment Patient Details Name: Timothy Durham MRN: [id number] DOB: 02-Mar-1931 Today's Date: 07/30/2014    History of Present Illness 81 y.o. male admitted with Acute hypoxic respiratory failure, and Nonischemic cardiomyopathy with acute exacerbation.    PT Comments    Pt admitted with above diagnosis. Pt currently with functional limitations due to the deficits listed below (see PT Problem List).  Pt progressing well ambulating well with RW.  Limited by desat with activity even with 4-6LO2. Continue PT.  Pt will benefit from skilled PT to increase their independence and safety with mobility to allow discharge to the venue listed below.    Follow Up Recommendations  Home health PT;Supervision/Assistance - 24 hour     Equipment Recommendations  Rolling walker with 5" wheels (may need tub bench vs 3-in-1 - TBD)    Recommendations for Other Services       Precautions / Restrictions Precautions Precautions: Fall Restrictions Weight Bearing Restrictions: No    Mobility  Bed Mobility               General bed mobility comments: sitting in chair  Transfers Overall transfer level: Needs assistance Equipment used: Rolling walker (2 wheeled) Transfers: Sit to/from Stand Sit to Stand: Min guard         General transfer comment: Min guard for safety. Demonstrates minor sway but able to self correct.  Ambulation/Gait Ambulation/Gait assistance: Min guard Ambulation Distance (Feet): 110 Feet Assistive device: Rolling walker (2 wheeled) Gait Pattern/deviations: Step-through pattern;Decreased stride length;Decreased dorsiflexion - right;Leaning posteriorly Gait velocity: decreased Gait velocity interpretation: <1.8 ft/sec, indicative of risk for recurrent falls General Gait Details: Pt able to ambulate in hallway today.  Pt with good safety awareness overall with occasional cues to sequence steps and Rw.     Stairs            Wheelchair Mobility     Modified Rankin (Stroke Patients Only)       Balance Overall balance assessment: Needs assistance         Standing balance support: Bilateral upper extremity supported;During functional activity Standing balance-Leahy Scale: Fair Standing balance comment: can stand statically for seconds without UE support.                     Cognition Arousal/Alertness: Awake/alert Behavior During Therapy: WFL for tasks assessed/performed Overall Cognitive Status: Within Functional Limits for tasks assessed                      Exercises General Exercises - Lower Extremity Ankle Circles/Pumps: AROM;Both;10 reps;Seated Long Arc Quad: Strengthening;Both;10 reps;Seated Hip Flexion/Marching: Strengthening;Both;10 reps;Seated Other Exercises Other Exercises: Instructed in use of incentive spirometer.     General Comments General comments (skin integrity, edema, etc.): SpO2 at rest on 4L 94%.  Desat down to 74% by end of walk and incr O2 to 6L toward end of walk.  Recovered to 90% within 2 minutes of rest on 6LO2.  Left pt on 4LO2 with sat at 94%.        Pertinent Vitals/Pain Pain Assessment: No/denies pain  See above for sats.  Other VSS.     Home Living                      Prior Function            PT Goals (current goals can now be found in the care plan section) Progress towards PT goals: Progressing toward goals  Frequency  Min 3X/week    PT Plan Current plan remains appropriate    Co-evaluation             End of Session Equipment Utilized During Treatment: Gait belt;Oxygen (4L) Activity Tolerance: Patient limited by fatigue Patient left: in chair;with call bell/phone within reach;with family/visitor present     Time: 0947-0962 PT Time Calculation (min) (ACUTE ONLY): 24 min  Charges:  $Gait Training: 8-22 mins $Therapeutic Exercise: 8-22 mins                    G CodesTawni Millers Durham 08-Aug-2014, 11:27 AM  Timothy Durham [facility] [phone] (pager)

## 2014-07-31 ENCOUNTER — Inpatient Hospital Stay (HOSPITAL_COMMUNITY): Payer: Medicare HMO

## 2014-07-31 DIAGNOSIS — J708 Respiratory conditions due to other specified external agents: Secondary | ICD-10-CM

## 2014-07-31 DIAGNOSIS — T462X5A Adverse effect of other antidysrhythmic drugs, initial encounter: Secondary | ICD-10-CM

## 2014-07-31 DIAGNOSIS — J984 Other disorders of lung: Secondary | ICD-10-CM | POA: Insufficient documentation

## 2014-07-31 LAB — BASIC METABOLIC PANEL
Anion gap: 10 (ref 5–15)
BUN: 82 mg/dL — ABNORMAL HIGH (ref 6–20)
CO2: 32 mmol/L (ref 22–32)
CREATININE: 1.91 mg/dL — AB (ref 0.61–1.24)
Calcium: 8.8 mg/dL — ABNORMAL LOW (ref 8.9–10.3)
Chloride: 94 mmol/L — ABNORMAL LOW (ref 101–111)
GFR, EST AFRICAN AMERICAN: 36 mL/min — AB (ref >60)
GFR, EST NON AFRICAN AMERICAN: 31 mL/min — AB (ref >60)
GLUCOSE: 127 mg/dL — AB (ref 70–99)
Potassium: 4.3 mmol/L (ref 3.5–5.1)
SODIUM: 136 mmol/L (ref 135–145)

## 2014-07-31 LAB — CBC
HCT: 27.7 % — ABNORMAL LOW (ref 39.0–52.0)
HEMOGLOBIN: 9.5 g/dL — AB (ref 13.0–17.0)
MCH: 31.3 pg (ref 26.0–34.0)
MCHC: 34.3 g/dL (ref 30.0–36.0)
MCV: 91.1 fL (ref 78.0–100.0)
PLATELETS: 362 10*3/uL (ref 150–400)
RBC: 3.04 MIL/uL — AB (ref 4.22–5.81)
RDW: 17.2 % — ABNORMAL HIGH (ref 11.5–15.5)
WBC: 18.3 10*3/uL — AB (ref 4.0–10.5)

## 2014-07-31 LAB — CULTURE, RESPIRATORY

## 2014-07-31 LAB — CULTURE, RESPIRATORY W GRAM STAIN: Culture: NORMAL

## 2014-07-31 LAB — HEPARIN LEVEL (UNFRACTIONATED)
Heparin Unfractionated: 0.36 IU/mL (ref 0.30–0.70)
Heparin Unfractionated: 0.35 IU/mL (ref 0.30–0.70)

## 2014-07-31 LAB — PROTIME-INR
INR: 1.89 — AB (ref 0.00–1.49)
Prothrombin Time: 21.8 seconds — ABNORMAL HIGH (ref 11.6–15.2)

## 2014-07-31 MED ORDER — CLOPIDOGREL BISULFATE 75 MG PO TABS
75.0000 mg | ORAL_TABLET | Freq: Every day | ORAL | Status: DC
  Administered 2014-07-31 – 2014-08-01 (×2): 75 mg via ORAL
  Filled 2014-07-31 (×2): qty 1

## 2014-07-31 MED ORDER — WARFARIN SODIUM 1 MG PO TABS
1.0000 mg | ORAL_TABLET | Freq: Once | ORAL | Status: AC
  Administered 2014-07-31: 1 mg via ORAL
  Filled 2014-07-31: qty 1

## 2014-07-31 MED ORDER — PREDNISONE 20 MG PO TABS
40.0000 mg | ORAL_TABLET | Freq: Every day | ORAL | Status: DC
  Filled 2014-07-31 (×2): qty 2

## 2014-07-31 MED ORDER — WARFARIN - PHARMACIST DOSING INPATIENT
Freq: Every day | Status: DC
  Administered 2014-07-31 – 2014-08-06 (×5)

## 2014-07-31 MED ORDER — ADULT MULTIVITAMIN W/MINERALS CH
1.0000 | ORAL_TABLET | Freq: Every day | ORAL | Status: DC
  Administered 2014-08-01 – 2014-08-07 (×7): 1 via ORAL
  Filled 2014-07-31 (×7): qty 1

## 2014-07-31 NOTE — Progress Notes (Signed)
Name: Timothy Durham MRN: [id number] DOB: October 11, 1930    ADMISSION DATE:  07/19/2014 CONSULTATION DATE:  4/29  REFERRING MD :  Roda Shutters  CHIEF COMPLAINT:  Pneumonia not improving   BRIEF PATIENT DESCRIPTION:  81 year old male w/ known sig h/o systolic CM (EF 30-35%), CKD stage IV, prior AVR, and PAF. Admitted 4/23 w/ working dx of PNA w/ element of decompensated HF/pulmonary edema. Treated w/ broad spec abx and lasix until creatinine climbed and interrogation of optivol device on pacemaker suggested euvolemia on 4/27. 4/29 early evening suddenly more short of breath. CT chest obtained showing diffuse ground-glass opacities bilateral. PCCM asked to see re: "pneumonia not improving"   SIGNIFICANT EVENTS    STUDIES:  CT chest 4/29: diffuse patchy GG opacifications bilaterally, small bilateral pleural effusions.  Barium swallow 5/3>>> No esophageal stricture or mass, nonspecific esophageal dysmotility, Stasis in the supine position.  SUBJECTIVE:  Feels tired today, no other complaints. Would like to try to ambulate more. Desats easily on 4-6 L.  VITAL SIGNS: Temp:  [97.6 F (36.4 C)-97.8 F (36.6 C)] 97.8 F (36.6 C) (05/05 0510) Pulse Rate:  [66-86] 81 (05/05 1000) Resp:  [18] 18 (05/05 0510) BP: (113-134)/(36-44) 125/41 mmHg (05/05 1000) SpO2:  [97 %-98 %] 98 % (05/05 0835) Weight:  [67.359 kg (148 lb 8 oz)] 67.359 kg (148 lb 8 oz) (05/05 0510)  PHYSICAL EXAMINATION: Gen: chronically ill appearing, male NAD  HENT: [place]/AT, PERRL, no JVD noted PULM: resps even, no labored on 5L [place], few scattered crackles CV: Regular, 1/6 SEM GI: BS+, soft, nontender Derm: no cyanosis or rash Psyche: normal mood and affect     Recent Labs Lab 07/29/14 0600 07/30/14 0548 07/31/14 0458  NA 132* 134* 136  K 4.5 4.8 4.3  CL 95* 92* 94*  CO2 26 30 32  BUN 79* 86* 82*  CREATININE 1.83* 1.91* 1.91*  GLUCOSE 143* 124* 127*    Recent Labs Lab 07/28/14 0522 07/30/14 0548 07/31/14 0458  HGB  9.7* 10.2* 9.5*  HCT 27.5* 29.6* 27.7*  WBC 19.6* 21.7* 18.3*  PLT 367 375 362   CT chest 4/29 images personally reviewed> bilateral patchy consolidation and ground glass in an upper lobe distribution, food in esophagus and in stomach  ASSESSMENT / PLAN:  Acute hypoxic respiratory failure in setting of diffuse bilateral infiltrates.. This started after about 5 months of amiodarone and within 72 hours of exposure to some new pinewood furniture he assembled.  The illness was associated with a low grade temperature, bilateral infiltrates, scant clear sputum production, and dyspnea with new hypoxemia.  Nothing has really made him better here.  He also notes choking on food from time to time and was known to aspirate after his stroke in the early 2000s. Barium swallow nonspecific.   DDx here is broad> amiodarone induced lung toxicity, hypersensitivity pneumonitis (should be better by now), less likely occult malignancy like bronchoalveolar cell carcinoma vs aspiration pneumonitis  Plan Continue supplemental O2 and titrate as able  Continue prednisone for 6 weeks Holding amiodarone in setting suspected lung toxicity Daily CXR No bronch at this time so OK to resume warfarin, plavix from pulmonary standpoint HR control   Encourage IS  Dysphagia:  Plan  Nonspecific esophageal dysmotility on barium swallow  Cont diet per speech recs   NICM (EF 30-35%), CKD stage III, h/o CVA, atrial fib, and GERD Plan Per Cardiology, ID and internal medicine teams.   Joneen Roach, AGACNP-BC [facility] Pulmonology/Critical Care Pager [phone] or [phone]  07/31/2014 11:35 AM

## 2014-07-31 NOTE — Progress Notes (Signed)
Anticoagulation CONSULT NOTE -Follow up  Pharmacy Consult for heparin Indication: atrial fibrillation  Allergies  Allergen Reactions  . Ambien [Zolpidem] Other (See Comments)    Hallucinations  . Ativan [Lorazepam] Other (See Comments)    hallucinations  . Oxycontin [Oxycodone] Other (See Comments)    hallucinations  . Penicillins     Unknown childhood reaction   . Sulfa Antibiotics     unknown    Patient Measurements: Height: 5\' 10"  (177.8 cm) Weight: 148 lb 8 oz (67.359 kg) (scale b) IBW/kg (Calculated) : 73  Vital Signs: Temp: 97.8 F (36.6 C) (05/05 0510) Temp Source: Oral (05/05 0510) BP: 125/41 mmHg (05/05 1000) Pulse Rate: 81 (05/05 1000)  Labs:  Recent Labs  07/29/14 0600 07/29/14 1110 07/30/14 0548 07/31/14 0101 07/31/14 0458  HGB  --   --  10.2*  --  9.5*  HCT  --   --  29.6*  --  27.7*  PLT  --   --  375  --  362  LABPROT  --  24.9* 22.5*  --  21.8*  INR  --  2.24* 1.96*  --  1.89*  HEPARINUNFRC  --   --   --  0.36 0.35  CREATININE 1.83*  --  1.91*  --  1.91*    Estimated Creatinine Clearance: 28.9 mL/min (by C-G formula based on Cr of 1.91).  Assessment:  81yo male on pta coumadin for PAF. Coumadin now on hold for possible procedure. Therapeutic heparin level. No bleeding reported  PTA warfarin dose: 2mg  daily  Goal of Therapy:  Heparin level 0.3-0.7 units/ml   Plan:  Continue heparin drip at 950 units/hr.  Recheck heparin level and CBC daily with morning labs. Follow for s/sx bleeding  Thanks for allowing pharmacy to be a part of this patient's care.  Tad Moore, BCPS  Clinical Pharmacist Pager [phone]  07/31/2014 11:47 AM

## 2014-07-31 NOTE — Progress Notes (Signed)
PROGRESS NOTE    Timothy Durham ZOX:[id number] DOB: 04-22-1930 DOA: 07/19/2014 PCP: Dennis Bast, MD  HPI/Brief narrative 81 year old male with history of nonischemic cardiomyopathy with EF of 30-35% as per last echo, NSTEMI in 2015 with cardiac cath showing nonobstructive CAD, chronic kidney disease stage III, history of stroke in 2000, history of bioprosthetic aVR, paroxysmal A. fib on chronic Coumadin, tobacco use with? COPD who presented with progressive shortness of breath for the past 3 days associated with nonproductive cough and wheezing. He reports being compliant with his medications. Chest x-ray on admission showing moderate vascular congestion. Labs showed elevated BNP of 1400. Patient admitted for acute hypoxic respiratory failure due to acute bronchitis. During the hospital stay he became increasingly short of breath and wheezy with follow-up chest x-ray showing multifocal pneumonia and volume overload. Pulmonary, ID and cardiology were consulted. At this time exact etiology of his hypoxia and CT chest with diffuse bilateral consolidated airspace disease, is unclear. Consideration for bronchoscopy.   Assessment/Plan:  Acute hypoxic respiratory failure - Initially admitted for acute bronchitis but during the hospital course became increasingly short of breath - broadened antibiotic coverage with empiric vancomycin and cefepime for HCAP,O2 supplement/ duonebs/ oral prednisone.  - passed swallow eval on 4/27 with regular diet and thin liquid. - 4/29 reported worsening of sob, chest CT showed persistent patchy consolidative and ground glass opacities in all lobes, increased oxygen requirement, - pulmology and ID consulted. - Sputum sample recollected: Normal OP flora. RSV panel negative,  - ID recommended to stop abx due to clinical course not consistent with typical bacterial infection, vanc/cefepime d/ced on 4/29. Azithromycin discontinued 5/1. ID signed off 5/2  - As per cardiology  follow-up 5/2, doubt amiodarone toxicity since only started in December 2015. Cardiology have discussed with pulmonology and have stopped amiodarone for now. Continue steroids. - Pulmonology input from 5/2 appreciated: Broad differential diagnosis: Amiodarone lung toxicity, hypersensitivity pneumonitis, less likely occult malignancy like bronchoalveolar cell carcinoma versus aspiration pneumonitis. - Barium swallow: Some nonspecific esophageal dysmotility. - Steroids changed and Solu-Medrol started 5/2 - Patient still requires high flow oxygen with minimal activity - Torsemide resumed by cardiology on 5/3. - Chest x-ray slightly better. - As per pulmonology follow-up 5/4: Risk of bronchoscopy outweighs benefit and hence plans to follow daily chest x-ray, keep off of amiodarone for 6 weeks, treated with prednisone for 6 weeks and if no improvement over that course or if worsens then consider bronchoscopy - Started Heparin gtt 5/4. - As per pulmonology 5/5: Acute respiratory failure with hypoxia presumably due to organizing pneumonia from amiodarone: Recommendations are to hold amiodarone indefinitely, slow prednisone taper, wean oxygen for >88%. Coumadin and Plavix resumed 5/5. - Slowly improving.  Non-ischemic cardiomyopathy/acute on chronic systolic CHF - euvolemic on admission but developed some volume overload likely precipitated by acute respiratory failure.  - Continue Coreg hydralazine, imdur and statin..  - Treated with IV Lasixand oral Aldactone. D/ced due to cr elevation. - Not on ACEi/ ARB due to CKD - Advanced heart failure team follow-up appreciated and do not believe that the primary process here is heart failure. Diurese as needed - Coumadin (last dose 5/1) and Plavix (last dose 5/2) held for possible bronchoscopy.  - Torsemide resumed 5/3   Acute on chronic kidney disease stage III - Baseline creatinine around 1.8-2.  - [place] abd shows medical renal ds. Repeat ua no infection.  Avoid renal toxin. Renal dosing meds. - Cr worsening, diuretic held since 4/30. - creatinine has improved from  2.18 > 1.8 >1.9. - Follow creatinine while diuretics have been resumed.   History of CVA and peripheral vascular disease - PTA on Coumadin, Plavix and statin. Patient on both Coumadin and Plavix for A. fib (had TIA on Coumadin alone). -Anticoagulation resumed since no plans for bronchoscopy at this time.  Atrial fibrillation - Pace rhythm, Rate controlled.  - Amiodarone discontinued 5/2 - Anticoagulation held and resumed  GERD Continue PPI  Malnutrition,  - low albumin, nutrition consult obtained.  BPH/urinary retention  - of >600 on 5/1, in and out cathx1, increased flomax.  Chronic anemia - Stable  Leukocytosis - Possibly related to steroids  Diarrhea - Resolved. C. difficile PCR negative.  Code Status: DNR Family Communication: Discussed with patient's spouse at bedside on 5/5. Disposition Plan: DC home when medically stable. Not medically stable yet for discharge. At risk for decline.   Consultants:  Advanced CHF team  PCCM  ID-signed off 5/2  Procedures:  None  Antibiotics:  Azithromycin- completed   Subjective: Diarrhea improved/resolved. Patient stated that he feels "so-so".  Objective: Filed Vitals:   07/31/14 0819 07/31/14 0835 07/31/14 1000 07/31/14 1500  BP: 134/40  125/41 122/40  Pulse: 86  81 75  Temp:    97.4 F (36.3 C)  TempSrc:    Oral  Resp:      Height:      Weight:      SpO2:  98%  98%    Intake/Output Summary (Last 24 hours) at 07/31/14 1651 Last data filed at 07/31/14 1500  Gross per 24 hour  Intake 818.12 ml  Output   3032 ml  Net -2213.88 ml   Filed Weights   07/29/14 0432 07/30/14 0540 07/31/14 0510  Weight: 68.7 kg (151 lb 7.3 oz) 68.266 kg (150 lb 8 oz) 67.359 kg (148 lb 8 oz)     Exam:  General exam: Pleasant elderly male sitting up comfortably in chair Respiratory system: Lungs are actually  sounding better with improved breath sounds and diminished crackles. No increased work of breathing. Cardiovascular system: S1 & S2 heard, RRR. No JVD, murmurs, gallops, clicks. Trace bilateral leg edema. Not on telemetry. Gastrointestinal system: Abdomen is nondistended, soft and nontender. Normal bowel sounds heard. Central nervous system: Alert and oriented. No focal neurological deficits. Extremities: Symmetric 5 x 5 power.   Data Reviewed: Basic Metabolic Panel:  Recent Labs Lab 07/27/14 0539 07/28/14 0522 07/29/14 0600 07/30/14 0548 07/31/14 0458  NA 132* 134* 132* 134* 136  K 4.1 4.3 4.5 4.8 4.3  CL 92* 95* 95* 92* 94*  CO2 32  GLUCOSE 89 104* 143* 124* 127*  BUN 82* 82* 79* 86* 82*  CREATININE 2.26* 2.18* 1.83* 1.91* 1.91*  CALCIUM 9.4 9.9 9.5 9.3 8.8*  MG 2.2  --   --   --   --    Liver Function Tests:  Recent Labs Lab 07/26/14 0407 07/27/14 0539 07/28/14 0522  AST 42* 30 26  ALT 51 44 38  ALKPHOS 130* 126 123  BILITOT 1.8* 1.2 1.1  PROT 6.2 6.0* 6.3*  ALBUMIN 2.2* 2.1* 2.1*   No results for input(s): LIPASE, AMYLASE in the last 168 hours. No results for input(s): AMMONIA in the last 168 hours. CBC:  Recent Labs Lab 07/25/14 0431 07/26/14 0407 07/28/14 0522 07/30/14 0548 07/31/14 0458  WBC 16.7* 19.8* 19.6* 21.7* 18.3*  HGB 9.8* 9.4* 9.7* 10.2* 9.5*  HCT 27.6* 26.2* 27.5* 29.6* 27.7*  MCV 88.5 87.9 88.4 90.8  91.1  PLT 325 340 367 375 362   Cardiac Enzymes: No results for input(s): CKTOTAL, CKMB, CKMBINDEX, TROPONINI in the last 168 hours. BNP (last 3 results)  Recent Labs  02/24/14 1606 02/26/14 1623 03/06/14 1129  PROBNP 2973.0* 18948.0* 5943.0*   CBG: No results for input(s): GLUCAP in the last 168 hours.  Recent Results (from the past 240 hour(s))  Culture, expectorated sputum-assessment     Status: None   Collection Time: 07/23/14 11:23 PM  Result Value Ref Range Status   Specimen Description SPUTUM  Final   Special  Requests NONE  Final   Sputum evaluation   Final    MICROSCOPIC FINDINGS SUGGEST THAT THIS SPECIMEN IS NOT REPRESENTATIVE OF LOWER RESPIRATORY SECRETIONS. PLEASE RECOLLECT. RESULT CALLED TO, READ BACK BY AND VERIFIED WITH: NOTIFIED G RUSUL,RN 07/24/14 AT 0040 BY RHOLMES   Report Status 07/24/2014 FINAL  Final  Culture, expectorated sputum-assessment     Status: None   Collection Time: 07/24/14  9:14 PM  Result Value Ref Range Status   Specimen Description SPUTUM  Final   Special Requests Normal  Final   Sputum evaluation   Final    MICROSCOPIC FINDINGS SUGGEST THAT THIS SPECIMEN IS NOT REPRESENTATIVE OF LOWER RESPIRATORY SECRETIONS. PLEASE RECOLLECT. CALLED TO D.HART ,RN AT 0124 BY L.PITT 07/25/14    Report Status 07/25/2014 FINAL  Final  Culture, expectorated sputum-assessment     Status: None   Collection Time: 07/25/14  3:42 PM  Result Value Ref Range Status   Specimen Description SPUTUM  Final   Special Requests Normal  Final   Sputum evaluation   Final    THIS SPECIMEN IS ACCEPTABLE. RESPIRATORY CULTURE REPORT TO FOLLOW.   Report Status 07/25/2014 FINAL  Final  Culture, respiratory (NON-Expectorated)     Status: None   Collection Time: 07/25/14  3:42 PM  Result Value Ref Range Status   Specimen Description SPUTUM  Final   Special Requests NONE  Final   Gram Stain   Final    RARE WBC PRESENT, PREDOMINANTLY PMN RARE SQUAMOUS EPITHELIAL CELLS PRESENT RARE GRAM POSITIVE COCCI IN PAIRS Performed at [facility]    Culture   Final    NORMAL OROPHARYNGEAL FLORA Performed at [facility]    Report Status 07/31/2014 FINAL  Final  Respiratory virus panel     Status: None   Collection Time: 07/25/14  8:03 PM  Result Value Ref Range Status   Respiratory Syncytial Virus A Negative Negative Final   Respiratory Syncytial Virus B Negative Negative Final   Influenza A Negative Negative Final   Influenza B Negative Negative Final   Parainfluenza 1 Negative Negative  Final   Parainfluenza 2 Negative Negative Final   Parainfluenza 3 Negative Negative Final   Metapneumovirus Negative Negative Final   Rhinovirus Negative Negative Final   Adenovirus Negative Negative Final    Comment: (NOTE) Performed At: [facility] [address] [place], [place] [id number] Mila Homer MD VV:[id number]   Clostridium Difficile by PCR     Status: None   Collection Time: 07/30/14 12:51 PM  Result Value Ref Range Status   C difficile by pcr NEGATIVE NEGATIVE Final         Studies: Dg Chest 2 View  07/31/2014   CLINICAL DATA:  Acute respiratory failure with hypoxia. Pt hx COPD, CAD  EXAM: CHEST  2 VIEW  COMPARISON:  07/30/2014  FINDINGS: Patchy areas of airspace and interstitial opacities in the lungs are stable from  most recent prior exam. No new lung abnormalities. No significant pleural effusion and no pneumothorax.  Changes from cardiac surgery and aortic valve replacement are stable. Cardiac silhouette is mildly enlarged. No mediastinal or hilar masses. Left anterior chest wall sequential pacemaker is stable and well positioned.  IMPRESSION: No change from the previous day's study. Bilateral patchy airspace and interstitial lung opacities are stable. No new abnormalities.   Electronically Signed   By: Amie Portland M.D.   On: 07/31/2014 13:02   Dg Chest 2 View  07/30/2014   CLINICAL DATA:  Hypoxia.  Weakness.  EXAM: CHEST  2 VIEW  COMPARISON:  07/28/2014.  FINDINGS: There is improvement from 07/28/2014, with partial clearance of the multifocal bilateral airspace opacities although significant opacities persist, left greater than right. Mild right lateral base pleural thickening persists. No significant effusion is evident. There is moderate unchanged cardiomegaly. There is prior sternotomy and aortic valvuloplasty. There are intact appearances of the transvenous leads.  IMPRESSION: Slight improvement. Multifocal bilateral airspace opacities persist, partially  cleared.   Electronically Signed   By: Ellery Plunk M.D.   On: 07/30/2014 06:50        Scheduled Meds: . allopurinol  200 mg Oral Daily  . atorvastatin  40 mg Oral QPM  . carvedilol  6.25 mg Oral BID WC  . clopidogrel  75 mg Oral Daily  . DULoxetine  60 mg Oral Daily  . hydrALAZINE  12.5 mg Oral BID  . ipratropium-albuterol  3 mL Nebulization TID  . isosorbide mononitrate  30 mg Oral Daily  . [START ON 08/01/2014] multivitamin with minerals  1 tablet Oral Daily  . pantoprazole  80 mg Oral Daily  . [START ON 08/01/2014] predniSONE  40 mg Oral Q breakfast  . sodium chloride  3 mL Intravenous Q12H  . tamsulosin  0.8 mg Oral QHS  . torsemide  20 mg Oral BID  . warfarin  1 mg Oral ONCE-1800  . Warfarin - Pharmacist Dosing Inpatient   Does not apply q1800   Continuous Infusions: . heparin 950 Units/hr (07/30/14 1734)    Principal Problem:   Acute respiratory failure Active Problems:   TIA 2007, Dec 2014 (Plavix added)   Coronary artery disease-moderate Oct 2015   CKD (chronic kidney disease), stage III   Moderate to Severe Mitral Regurgitation   PAD- LLE PTA, RCEA, attempted LICA stent   Tissue AVR 2006 (in [place])   NICM (nonischemic cardiomyopathy)   PAF   Hyperlipidemia   Chronic anticoagulation   Pacemaker- MDT BiV 02/17/14   Acute on chronic systolic CHF (congestive heart failure)   Chronic systolic heart failure   Acute renal failure superimposed on stage 3 chronic kidney disease   Pneumonia, organism unspecified   Acute respiratory failure with hypoxia   Abnormal CT scan, chest   Diarrhea    Time spent: 20 minutes    Aliene Tamura, MD, FACP, FHM. [facility] Pager [phone]  If 7PM-7AM, please contact night-coverage [email or web address] Password [email or web address] 07/31/2014, 4:51 PM    LOS: 11 days

## 2014-07-31 NOTE — Progress Notes (Signed)
SATURATION QUALIFICATIONS: (This note is used to comply with regulatory documentation for home oxygen)  Patient Saturations on Room Air at Rest = Not tested  Patient Saturations on Room Air while Ambulating = not tested  Patient Saturations on 4 Liters of oxygen while Ambulating = 85%-88%  Please briefly explain why patient needs home oxygen:

## 2014-07-31 NOTE — Progress Notes (Signed)
FOLLOW-UP NUTRITION ASSESSMENT  DOCUMENTATION CODES Per approved criteria  -Non-severe (moderate) malnutrition in the context of chronic illness   INTERVENTION: Provide Multivitamin with minerals daily Allow double portions of meats at meals Provide snack once daily Encourage PO intake  NUTRITION DIAGNOSIS: Increased protein needs related to acute illness as evidenced by having PNA/WBC 19.8; ongoing  Goal: Pt to meet >/= 90% of their estimated nutrition needs; not met  Monitor:  Oral intake, fluid status, labs  Admitting Dx: Acute respiratory failure  ASSESSMENT: 81 year old male with history of nonischemic cardiomyopathy, NSTEMI,, CAD, CKD, stroke, COPD who presented with progressive SOB for the past 3 days. chest x-ray showing multifocal pneumonia and volume overload.  Pt states that he has been eating 100% of most meals. He was not consuming Pro-stat protein supplements but, agrees to double portions of meats at meals instead to improve protein intake.    Labs: low hemoglobin, low calcium, low GFR, low albumin    Height: Ht Readings from Last 1 Encounters:  07/20/14 _0  (1.778 m)    Weight: Wt Readings from Last 1 Encounters:  07/31/14 148 lb 8 oz (67.359 kg)    Ideal Body Weight: 166 lbs  BMI:  Body mass index is 21.31 kg/(m^2).  Estimated Nutritional Needs: Kcal: 1850-1950  Protein: 80-95 grams Fluid: 1.8 L/day  Skin: +2 RLE and LLE edema per nursing notes  Diet Order: Diet Heart Room service appropriate?: Yes; Fluid consistency:: Thin    Intake/Output Summary (Last 24 hours) at 07/31/14 1608 Last data filed at 07/31/14 1500  Gross per 24 hour  Intake 818.12 ml  Output   3032 ml  Net -2213.88 ml    Last BM: 5/4   Continuous Infusions: . heparin 950 Units/hr (07/30/14 1734)   Pryor Ochoa RD, LDN Inpatient Clinical Dietitian Pager: [phone] After Hours Pager: [phone]

## 2014-07-31 NOTE — Progress Notes (Signed)
Ambulated patient in hallway 150 feet on oxygen at 4 lpm, nasal cannula. Saturations drop to 85%-88%. Not mild shortness of breath on exertion. Sats returned to 93%-95% at rest, post ambulation.

## 2014-07-31 NOTE — Progress Notes (Signed)
Anticoagulation CONSULT NOTE -Follow up  Pharmacy Consult for heparin Indication: atrial fibrillation  Allergies  Allergen Reactions  . Ambien [Zolpidem] Other (See Comments)    Hallucinations  . Ativan [Lorazepam] Other (See Comments)    hallucinations  . Oxycontin [Oxycodone] Other (See Comments)    hallucinations  . Penicillins     Unknown childhood reaction   . Sulfa Antibiotics     unknown    Patient Measurements: Height: 5\' 10"  (177.8 cm) Weight: 150 lb 8 oz (68.266 kg) (Scale B) IBW/kg (Calculated) : 73  Vital Signs: Temp: 97.6 F (36.4 C) (05/04 2114) Temp Source: Oral (05/04 2114) BP: 129/44 mmHg (05/04 2146) Pulse Rate: 68 (05/04 2114)  Labs:  Recent Labs  07/28/14 0522 07/29/14 0600 07/29/14 1110 07/30/14 0548 07/31/14 0101  HGB 9.7*  --   --  10.2*  --   HCT 27.5*  --   --  29.6*  --   PLT 367  --   --  375  --   LABPROT 25.1*  --  24.9* 22.5*  --   INR 2.25*  --  2.24* 1.96*  --   HEPARINUNFRC  --   --   --   --  0.36  CREATININE 2.18* 1.83*  --  1.91*  --     Estimated Creatinine Clearance: 29.3 mL/min (by C-G formula based on Cr of 1.91).  Assessment: 8 hour heparin level = 0.36 on heparin drip 950 units/hr in this 81yo male on pta coumadin for PAF. Coumadin now on hold for possible procedure. Therapeutic heparin level. No bleeding reported  PTA warfarin dose: 2mg  daily  Goal of Therapy:  Heparin level 0.3-0.7 units/ml   Plan:  Continue heparin drip at 950 units/hr.  Recheck heparin level with this morning's labs to confirm remains therapeutic. Follow for s/sx bleeding  Thanks for allowing pharmacy to be a part of this patient's care.  Noah Delaine, RPh Clinical Pharmacist Pager: [phone]  07/31/2014 3:25 AM

## 2014-07-31 NOTE — Progress Notes (Signed)
ANTICOAGULATION CONSULT NOTE - Initial Consult  Pharmacy Consult for Coumadin Indication: Bioprosthetic AVR / PAF  Allergies  Allergen Reactions  . Ambien [Zolpidem] Other (See Comments)    Hallucinations  . Ativan [Lorazepam] Other (See Comments)    hallucinations  . Oxycontin [Oxycodone] Other (See Comments)    hallucinations  . Penicillins     Unknown childhood reaction   . Sulfa Antibiotics     unknown    Patient Measurements: Height:  (177.8 cm) Weight: 148 lb 8 oz (67.359 kg) (scale b) IBW/kg (Calculated) : 73   Vital Signs: Temp: 97.4 F (36.3 C) (05/05 1500) Temp Source: Oral (05/05 1500) BP: 122/40 mmHg (05/05 1500) Pulse Rate: 75 (05/05 1500)  Labs:  Recent Labs  07/29/14 0600 07/29/14 1110 07/30/14 0548 07/31/14 0101 07/31/14 0458  HGB  --   --  10.2*  --  9.5*  HCT  --   --  29.6*  --  27.7*  PLT  --   --  375  --  362  LABPROT  --  24.9* 22.5*  --  21.8*  INR  --  2.24* 1.96*  --  1.89*  HEPARINUNFRC  --   --   --  0.36 0.35  CREATININE 1.83*  --  1.91*  --  1.91*    Estimated Creatinine Clearance: 28.9 mL/min (by C-G formula based on Cr of 1.91).   Medical History: Past Medical History  Diagnosis Date  . Arthritis   . Non-obstructive CAD     a. 12/2013 NSTEMI/Cath: LM nl, LAD 20, LCX 40-50, RCA 20.  Marland Kitchen Hypertension   . Stroke     a. July 2000;  b. January 2007;  c. TIA in 2014.  . CKD (chronic kidney disease), stage III   . GERD (gastroesophageal reflux disease)   . Foot pain, bilateral   . H/O hematuria   . Hyperlipidemia   . Vitamin D deficiency   . Chronic combined systolic and diastolic CHF (congestive heart failure)     a. 12/2013 Echo: EF 30-35%, mod conc LVH, Gr 3 DD, mild AI, mod-sev MR, mod dil LA, mild TR.  Marland Kitchen PAF (paroxysmal atrial fibrillation)     a. CHA2DS2VASc = 7--> Chronic Coumadin.  Marland Kitchen NICM (nonischemic cardiomyopathy)     a. 12/2013 Echo: EF 30-35%.  . Aortic valve prosthesis present     a. 2006: S/P  bioprosthetic AVR.  Marland Kitchen PAD (peripheral artery disease)     a. s/p LLE stenting.  . Carotid arterial disease     a. s/p R CEA  . History of tobacco abuse   . Moderate to Severe Mitral Regurgitation     a. 12/2013 Echo: Mod-Sev MR.  Marland Kitchen Anxiety   . COPD (chronic obstructive pulmonary disease)   . Coronary arteriosclerosis Oct 2015    med Rx  . Shortness of breath dyspnea   . Depression   . Presence of permanent cardiac pacemaker   . Heart murmur   . Dysrhythmia     Medications:  Scheduled:  . allopurinol  200 mg Oral Daily  . atorvastatin  40 mg Oral QPM  . carvedilol  6.25 mg Oral BID WC  . clopidogrel  75 mg Oral Daily  . DULoxetine  60 mg Oral Daily  . hydrALAZINE  12.5 mg Oral BID  . ipratropium-albuterol  3 mL Nebulization TID  . isosorbide mononitrate  30 mg Oral Daily  . pantoprazole  80 mg Oral Daily  . [START ON 08/01/2014]  predniSONE  40 mg Oral Q breakfast  . sodium chloride  3 mL Intravenous Q12H  . tamsulosin  0.8 mg Oral QHS  . torsemide  20 mg Oral BID    Assessment: Patient was on warfarin 2 mg daily PTA and was admitted with an INR of 2.99. Upon admission warfarin was stopped and heparin drip was started. His current INR is 1.89.  Goal of Therapy:  INR 2-3 Monitor platelets by anticoagulation protocol: Yes   Plan:  Initiate 1mg  warfarin PO daily.  Monitor INR daily. Monitor for signs and symptoms of bleeding daily.   Ivar Bury PharmD Candidate 07/31/2014,3:22 PM  Agree. Okey Regal, PharmD

## 2014-07-31 NOTE — Progress Notes (Signed)
Physical Therapy Treatment Patient Details Name: Timothy Durham MRN: [id number] DOB: Jun 23, 1930 Today's Date: 07/31/2014    History of Present Illness 81 y.o. male admitted with Acute hypoxic respiratory failure, and Nonischemic cardiomyopathy with acute exacerbation.    PT Comments    Progressing steadily.  Sats dropped into the mid 80's during gait at both 4 and 6L's, but with 6L [place] recovery noticeably quicker.  Pt never looked especially fatigued or SOB.  Foot flap is also concerning given pt states he has had poor circulation in that leg.  I asked pt to make this aware to his MD.  Follow Up Recommendations  Home health PT;Supervision/Assistance - 24 hour     Equipment Recommendations  Rolling walker with 5" wheels    Recommendations for Other Services       Precautions / Restrictions Precautions Precautions: Fall    Mobility  Bed Mobility               General bed mobility comments: sitting in chair  Transfers Overall transfer level: Needs assistance Equipment used: Rolling walker (2 wheeled) Transfers: Sit to/from Stand Sit to Stand: Supervision            Ambulation/Gait Ambulation/Gait assistance: Min guard Ambulation Distance (Feet): 300 Feet Assistive device: Rolling walker (2 wheeled) Gait Pattern/deviations: Step-through pattern (foot flap) Gait velocity: decreased   General Gait Details: Initially fairly steady then R foot "flap" becomes more pronounced with fatigue and pt becomes more unsteady.  See comments for SpO2/ [facility]   Stairs            Wheelchair Mobility    Modified Rankin (Stroke Patients Only)       Balance Overall balance assessment: Needs assistance Sitting-balance support: No upper extremity supported Sitting balance-Leahy Scale: Good     Standing balance support: No upper extremity supported Standing balance-Leahy Scale: Fair                      Cognition Arousal/Alertness: Awake/alert Behavior During  Therapy: WFL for tasks assessed/performed Overall Cognitive Status: Within Functional Limits for tasks assessed                      Exercises      General Comments General comments (skin integrity, edema, etc.): SpO2 at 94% at rest on 3L, At 4L [place] during gait sats dropped to 84% EHR 89 bpm, on 6L [place] still dropped to 84/85% EHR low 90's.  Recovery each time with efficient breathing and standing rest took less than 1 min.      Pertinent Vitals/Pain Pain Assessment: No/denies pain    Home Living                      Prior Function            PT Goals (current goals can now be found in the care plan section) Acute Rehab PT Goals PT Goal Formulation: With patient Time For Goal Achievement: 08/06/14 Potential to Achieve Goals: Good Progress towards PT goals: Progressing toward goals    Frequency  Min 3X/week    PT Plan Current plan remains appropriate    Co-evaluation             End of Session Equipment Utilized During Treatment: Oxygen;Gait belt Activity Tolerance: Patient tolerated treatment well Patient left: in chair;with call bell/phone within reach     Time: 1650-1709 PT Time Calculation (min) (ACUTE ONLY): 19 min  Charges:  $  Gait Training: 8-22 mins                    G Codes:      Timothy Durham, Eliseo Gum 07/31/2014, 5:18 PM 07/31/2014  [place], PT [phone]  (pager)

## 2014-07-31 NOTE — Progress Notes (Signed)
Patient ID: Timothy Durham, male   DOB: 09-23-1930, 81 y.o.   MRN: [id number]   Timothy Durham is an 81 yo male with a history of prior CVA in 2000 secondary to PAF, nonischemic cardimoyopathy s/p [employer] CRT-P (02/17/14), chronic systolic HF (EF 30-35%), non-obstructive CAD, HTN, CKD stage IV, HLD, PAD and bioprosthetic AVR.   Followed closely in HF clinic and recently doing quite well. Diuretics had to be cut back a bit due to hypotension. Weight has been stable around 154 pounds.   Admitted 4/24 ago with worsening SOB with fevers and chills with WBC 12.2 and left shift. Weight stable. Initial CXR with mild vascular congestion. Felt to have acute bronchitis. However dyspnea got worse and CXR 4/26 showed worsening bilateral infiltrates ? HF vs multifocal PNA. IV Lasix and abx initially, has completed antibiotic course.  Steroids increased.   Feels a little better again today.  Not walking much.   Anticoagulation held for possible bronchoscopy, now on heparin gtt.   CT chest with diffuse, bilateral consolidated airspace disease.    Barium swallow with "nonspecific esophageal dysmotility disorder."   Scheduled Meds: . allopurinol  200 mg Oral Daily  . atorvastatin  40 mg Oral QPM  . carvedilol  6.25 mg Oral BID WC  . DULoxetine  60 mg Oral Daily  . hydrALAZINE  12.5 mg Oral BID  . ipratropium-albuterol  3 mL Nebulization TID  . isosorbide mononitrate  30 mg Oral Daily  . pantoprazole  80 mg Oral Daily  . [START ON 08/01/2014] predniSONE  40 mg Oral Q breakfast  . sodium chloride  3 mL Intravenous Q12H  . tamsulosin  0.8 mg Oral QHS  . torsemide  20 mg Oral BID   Continuous Infusions: . heparin 950 Units/hr (07/30/14 1734)   PRN Meds:.acetaminophen, albuterol   Filed Vitals:   07/31/14 0510 07/31/14 0819 07/31/14 0835 07/31/14 1000  BP: 119/44 134/40  125/41  Pulse: 66 86  81  Temp: 97.8 F (36.6 C)     TempSrc: Oral     Resp: 18     Height:      Weight: 148 lb 8 oz (67.359 kg)      SpO2: 98%  98%     Intake/Output Summary (Last 24 hours) at 07/31/14 1450 Last data filed at 07/31/14 1329  Gross per 24 hour  Intake 818.12 ml  Output   3031 ml  Net -2212.88 ml    LABS: Basic Metabolic Panel:  Recent Labs  40/98/11 0548 07/31/14 0458  NA 134* 136  K 4.8 4.3  CL 92* 94*  CO2 30 32  GLUCOSE 124* 127*  BUN 86* 82*  CREATININE 1.91* 1.91*  CALCIUM 9.3 8.8*   Liver Function Tests: No results for input(s): AST, ALT, ALKPHOS, BILITOT, PROT, ALBUMIN in the last 72 hours. No results for input(s): LIPASE, AMYLASE in the last 72 hours. CBC:  Recent Labs  07/30/14 0548 07/31/14 0458  WBC 21.7* 18.3*  HGB 10.2* 9.5*  HCT 29.6* 27.7*  MCV 90.8 91.1  PLT 375 362   Cardiac Enzymes: No results for input(s): CKTOTAL, CKMB, CKMBINDEX, TROPONINI in the last 72 hours. BNP: Invalid input(s): POCBNP D-Dimer: No results for input(s): DDIMER in the last 72 hours. Hemoglobin A1C: No results for input(s): HGBA1C in the last 72 hours. Fasting Lipid Panel: No results for input(s): CHOL, HDL, LDLCALC, TRIG, CHOLHDL, LDLDIRECT in the last 72 hours. Thyroid Function Tests: No results for input(s): TSH, T4TOTAL, T3FREE, THYROIDAB in the last  72 hours.  Invalid input(s): FREET3 Anemia Panel: No results for input(s): VITAMINB12, FOLATE, FERRITIN, TIBC, IRON, RETICCTPCT in the last 72 hours.  RADIOLOGY: Dg Chest 2 View  07/31/2014   CLINICAL DATA:  Acute respiratory failure with hypoxia. Pt hx COPD, CAD  EXAM: CHEST  2 VIEW  COMPARISON:  07/30/2014  FINDINGS: Patchy areas of airspace and interstitial opacities in the lungs are stable from most recent prior exam. No new lung abnormalities. No significant pleural effusion and no pneumothorax.  Changes from cardiac surgery and aortic valve replacement are stable. Cardiac silhouette is mildly enlarged. No mediastinal or hilar masses. Left anterior chest wall sequential pacemaker is stable and well positioned.  IMPRESSION: No  change from the previous day's study. Bilateral patchy airspace and interstitial lung opacities are stable. No new abnormalities.   Electronically Signed   By: Amie Portland M.D.   On: 07/31/2014 13:02   Dg Chest 2 View  07/30/2014   CLINICAL DATA:  Hypoxia.  Weakness.  EXAM: CHEST  2 VIEW  COMPARISON:  07/28/2014.  FINDINGS: There is improvement from 07/28/2014, with partial clearance of the multifocal bilateral airspace opacities although significant opacities persist, left greater than right. Mild right lateral base pleural thickening persists. No significant effusion is evident. There is moderate unchanged cardiomegaly. There is prior sternotomy and aortic valvuloplasty. There are intact appearances of the transvenous leads.  IMPRESSION: Slight improvement. Multifocal bilateral airspace opacities persist, partially cleared.   Electronically Signed   By: Ellery Plunk M.D.   On: 07/30/2014 06:50   Dg Chest 2 View  07/28/2014   CLINICAL DATA:  Respiratory failure .  EXAM: CHEST  2 VIEW  COMPARISON:  07/22/2014 .  CT 07/25/2014.  FINDINGS: Mediastinum and hilar structures are normal. Prior cardiac valve replacement. Cardiac pacer noted with lead tips in stable position. Persistent multifocal bilateral pulmonary infiltrates, no interim change. Small right pleural effusion. No pneumothorax.  IMPRESSION: 1. Persistent multifocal bilateral pulmonary infiltrates most consistent with pneumonia. No significant interim change.  2. Prior cardiac valve replacement. Cardiac pacer stable position. Stable cardiomegaly .   Electronically Signed   By: Maisie Fus  Register   On: 07/28/2014 08:33   Dg Chest 2 View  07/20/2014   CLINICAL DATA:  Shortness of breath.  Pacemaker.  EXAM: CHEST  2 VIEW  COMPARISON:  05/04/2014.  FINDINGS: Mildly increased cardiac silhouette. Triple lead pacer unchanged from priors. Moderate vascular congestion without overt failure or significant consolidation. Mild to moderate pleural effusions,  greater on the RIGHT. Aortic valvular placement with prior median sternotomy. No acute osseous findings.  IMPRESSION: Cardiomegaly with stable appearance of the transvenous pacer.  Moderate vascular congestion without overt failure. Overall improved from prior radiograph in February.   Electronically Signed   By: Davonna Belling M.D.   On: 07/20/2014 02:23   Ct Chest Wo Contrast  07/25/2014   CLINICAL DATA:  Hypoxia and progressive shortness of breath for the past 3 days. Nonproductive cough and wheeze  EXAM: CT CHEST WITHOUT CONTRAST  TECHNIQUE: Multidetector CT imaging of the chest was performed following the standard protocol without IV contrast.  COMPARISON:  None.  FINDINGS: THORACIC INLET/BODY WALL:  There is a biventricular pacer from the left with leads in unremarkable position. No acute findings at the thoracic inlet.  There is an incidental 1 cm intramuscular lipoma in the right chest wall.  MEDIASTINUM:  Cardiomegaly which is chronic based on chest x-rays. No pericardial effusion. The patient is status post median sternotomy with  aortic valve replacement. The ascending aorta measures 41 mm in diameter. There is mild enlargement of mediastinal lymph nodes which could be reactive to the presumed pneumonia or sequela of remote granulomatous disease (there is also scattered nodal coarse calcification). No acute vascular findings. Negative esophagus.  LUNG WINDOWS:  There is patchy consolidative and ground-glass opacities in all lobes. Airspace disease is most extensive in the left upper lobe and most dense in the posterior segment right upper lobe. Small bilateral pleural effusions with interlobular septal thickening. No cavitation. No evidence of loculated effusion.  UPPER ABDOMEN:  Granulomatous changes in the liver and spleen.  OSSEOUS:  No acute findings.  IMPRESSION: 1. Multilobar pneumonia. 2. Superimposed interstitial edema and small pleural effusions. 3. Chronic and postoperative findings noted above.    Electronically Signed   By: Marnee Spring M.D.   On: 07/25/2014 08:00   Dg Esophagus  07/29/2014   CLINICAL DATA:  Dysphagia.  Choking.  Bilateral pneumonia.  EXAM: ESOPHOGRAM/BARIUM SWALLOW  TECHNIQUE: Combined double contrast and single contrast examination performed using effervescent crystals, thick barium liquid, and thin barium liquid.  FLUOROSCOPY TIME:  Radiation Exposure Index (as provided by the fluoroscopic device): Dose area product: 829.79uGy*m^2  COMPARISON:  Radiographs 07/28/2014.  Chest CT 07/25/2014.  FINDINGS: Pharyngeal phase of swallowing was normal. The study was carried out in the AP projection due to patient condition. Nonspecific esophageal dysmotility was present without stricture. 0/3 primary waves reach the gastroesophageal junction. Tertiary contractions were observed. Fetal on esophagus incidentally noted.  There is no bear ear passage of the 13 mm barium tablet challenge however the tablet remain static in the lower esophagus just proximal to the gastroesophageal junction for several min. No gastroesophageal reflux could be elicited with water siphon test.  Esophageal stasis was present in the supine position. Esophageal folds appeared within normal limits.  IMPRESSION: 1. No esophageal stricture or mass. 2. Nonspecific esophageal dysmotility disorder with with tertiary contractions. Stasis in the supine position.   Electronically Signed   By: Andreas Newport M.D.   On: 07/29/2014 09:39   US Renal  07/20/2014   CLINICAL DATA:  Acute kidney injury.  Hypertension.  EXAM: RENAL/URINARY TRACT ULTRASOUND COMPLETE  COMPARISON:  None.  FINDINGS: Right Kidney:  Length: 10.3 cm. Echogenicity within normal limits. No mass or hydronephrosis visualized. Diffuse cortical thinning with prominent renal sinus fat.  Left Kidney:  Length: 11.1 cm. Echogenicity within normal limits. No mass or hydronephrosis visualized. Diffuse cortical thinning with prominent renal sinus fat.  Bladder:   Multiple diverticula. The largest measures 4.9 cm in maximum diameter. Small amount of internal debris with no definite calculi  IMPRESSION: 1. Diffuse bilateral renal cortical atrophy. 2. Multiple bladder diverticula. 3. Small amount of debris in the bladder with no definite calculi.   Electronically Signed   By: Beckie Salts M.D.   On: 07/20/2014 12:27   Dg Chest Port 1 View  07/22/2014   CLINICAL DATA:  Acute onset of hypoxemia.  Initial encounter.  EXAM: PORTABLE CHEST - 1 VIEW  COMPARISON:  Chest radiograph performed 07/20/2014  FINDINGS: The lungs are well-aerated. Worsening bilateral midlung airspace opacification raises concern for multifocal pneumonia. Small bilateral pleural effusions are seen, and underlying interstitial edema cannot be excluded. Vascular congestion is noted. No pneumothorax is seen.  The cardiomediastinal silhouette is mildly enlarged. The patient is status post median sternotomy. A pacemaker is noted overlying the left chest wall, with leads ending overlying the right atrium and right ventricle. No acute  osseous abnormalities are seen.  IMPRESSION: 1. Worsening bilateral mid lung airspace opacification raises concern for multifocal pneumonia. 2. Small bilateral pleural effusions, with vascular congestion and mild cardiomegaly. Underlying interstitial edema cannot be excluded.   Electronically Signed   By: Roanna Raider M.D.   On: 07/22/2014 04:30    PHYSICAL EXAM General: NAD. Sitting in chair Neck: JVP  6-7 cm, no thyromegaly or thyroid nodule.  Lungs: + crackles Decreased breath sounds at bases bilaterally.   CV: Nondisplaced PMI.  Heart regular S1/S2, no S3/S4, 2/6 HSM LLSB/apex.  1+ ankle edema.   Abdomen: Soft, nontender, no hepatosplenomegaly, no distention.  Neurologic: Alert and oriented x 3.  Psych: Normal affect. Extremities: No clubbing or cyanosis.   TELEMETRY: Reviewed telemetry pt in NSR with BiV pacing    Assessment:   1. Acute respiratory  failure: Suspect CHF + possible PNA 2. Acute on chronic systolic heart failure EF 30-35% 3. Acute bronchitis 4. CKD, stage III-IV 5. PAF in NSR on amio - CHDSVASC 5. On warfarin 6. Multifocal airspace disease: ?amiodarone toxicity versus hypersensitivity pneumonitis vs aspiration PNA.    Plan/Discussion:  From HF perspective Timothy Durham is stable. Volume status looks ok. Timothy Durham has restarted torsemide 20 mg bid. Continue current dose of carvedilol, hydralazine, and imdur. No ACEI with CKD.   Workup of airspace disease per pulmonary.  Timothy Durham is now off amiodarone for ?lung toxicity.  Timothy Durham has been seen by Dr Kendrick Fries, plan to treat with tapering prednisone for possible organizing PNA from amiodarone toxicity.  Does not sound like Timothy Durham will get bronchoscopy.  Can restart warfarin and Plavix.        Marca Ancona  07/31/2014 2:50 PM

## 2014-08-01 ENCOUNTER — Inpatient Hospital Stay (HOSPITAL_COMMUNITY): Payer: Medicare HMO

## 2014-08-01 ENCOUNTER — Inpatient Hospital Stay (HOSPITAL_COMMUNITY): Admit: 2014-08-01 | Payer: Medicare HMO

## 2014-08-01 LAB — HYPERSENSITIVITY PNEUMONITIS
A. Pullulans Abs: NEGATIVE
A.Fumigatus #1 Abs: NEGATIVE
Micropolyspora faeni, IgG: NEGATIVE
PIGEON SERUM ABS: NEGATIVE
Thermoact. Saccharii: NEGATIVE
Thermoactinomyces vulgaris, IgG: NEGATIVE

## 2014-08-01 LAB — CBC
HEMATOCRIT: 29.2 % — AB (ref 39.0–52.0)
HEMOGLOBIN: 9.7 g/dL — AB (ref 13.0–17.0)
MCH: 31.7 pg (ref 26.0–34.0)
MCHC: 33.2 g/dL (ref 30.0–36.0)
MCV: 95.4 fL (ref 78.0–100.0)
Platelets: 390 10*3/uL (ref 150–400)
RBC: 3.06 MIL/uL — ABNORMAL LOW (ref 4.22–5.81)
RDW: 17.9 % — AB (ref 11.5–15.5)
WBC: 22 10*3/uL — ABNORMAL HIGH (ref 4.0–10.5)

## 2014-08-01 LAB — BASIC METABOLIC PANEL
Anion gap: 11 (ref 5–15)
BUN: 78 mg/dL — AB (ref 6–20)
CHLORIDE: 95 mmol/L — AB (ref 101–111)
CO2: 34 mmol/L — AB (ref 22–32)
Calcium: 9.3 mg/dL (ref 8.9–10.3)
Creatinine, Ser: 2.02 mg/dL — ABNORMAL HIGH (ref 0.61–1.24)
GFR calc Af Amer: 34 mL/min — ABNORMAL LOW (ref >60)
GFR calc non Af Amer: 29 mL/min — ABNORMAL LOW (ref >60)
GLUCOSE: 106 mg/dL — AB (ref 70–99)
POTASSIUM: 4.6 mmol/L (ref 3.5–5.1)
Sodium: 140 mmol/L (ref 135–145)

## 2014-08-01 LAB — PROTIME-INR
INR: 1.62 — AB (ref 0.00–1.49)
Prothrombin Time: 19.4 seconds — ABNORMAL HIGH (ref 11.6–15.2)

## 2014-08-01 LAB — HEPARIN LEVEL (UNFRACTIONATED): HEPARIN UNFRACTIONATED: 0.5 [IU]/mL (ref 0.30–0.70)

## 2014-08-01 MED ORDER — FUROSEMIDE 10 MG/ML IJ SOLN
40.0000 mg | Freq: Once | INTRAMUSCULAR | Status: AC
  Administered 2014-08-01: 40 mg via INTRAVENOUS
  Filled 2014-08-01: qty 4

## 2014-08-01 MED ORDER — TORSEMIDE 20 MG PO TABS
40.0000 mg | ORAL_TABLET | Freq: Every day | ORAL | Status: DC
  Administered 2014-08-02 – 2014-08-05 (×4): 40 mg via ORAL
  Filled 2014-08-01 (×7): qty 2

## 2014-08-01 MED ORDER — WARFARIN SODIUM 2 MG PO TABS: 2.0000 mg | ORAL_TABLET | Freq: Once | ORAL | Status: DC

## 2014-08-01 MED ORDER — TORSEMIDE 20 MG PO TABS
20.0000 mg | ORAL_TABLET | Freq: Every evening | ORAL | Status: DC
  Administered 2014-08-02 – 2014-08-04 (×3): 20 mg via ORAL
  Filled 2014-08-01 (×4): qty 1

## 2014-08-01 MED ORDER — PREDNISONE 20 MG PO TABS
40.0000 mg | ORAL_TABLET | Freq: Every day | ORAL | Status: DC
  Administered 2014-08-01 – 2014-08-07 (×7): 40 mg via ORAL
  Filled 2014-08-01 (×8): qty 2

## 2014-08-01 MED ORDER — WARFARIN SODIUM 2 MG PO TABS
2.0000 mg | ORAL_TABLET | Freq: Once | ORAL | Status: AC
  Administered 2014-08-01: 2 mg via ORAL
  Filled 2014-08-01: qty 1

## 2014-08-01 NOTE — Progress Notes (Signed)
Physical Therapy Treatment Patient Details Name: Timothy Durham MRN: [id number] DOB: October 16, 1930 Today's Date: 08/01/2014    History of Present Illness 81 y.o. male admitted with Acute hypoxic respiratory failure, and Nonischemic cardiomyopathy with acute exacerbation.    PT Comments    Progressing well.  Pt /wife educated on the complications of his sats dropping and what to look for.  Also discussed his drop foot/foot flap as potentially coming from neuropathy due to ?R LE vascular compromised.  Gave them info on a orthotic device to help with mild foot drop.   Follow Up Recommendations  Home health PT;Supervision/Assistance - 24 hour     Equipment Recommendations  Rolling walker with 5" wheels    Recommendations for Other Services       Precautions / Restrictions Precautions Precautions: Fall Restrictions Weight Bearing Restrictions: No    Mobility  Bed Mobility               General bed mobility comments: sitting in chair  Transfers Overall transfer level: Needs assistance Equipment used: Rolling walker (2 wheeled) Transfers: Sit to/from Stand Sit to Stand: Supervision         General transfer comment: safe transitions  Ambulation/Gait Ambulation/Gait assistance: Min guard Ambulation Distance (Feet): 300 Feet Assistive device:  (iv pole) Gait Pattern/deviations: Step-through pattern;Steppage Gait velocity: decreased Gait velocity interpretation: Below normal speed for age/gender General Gait Details: less steady with device like iv pole vs RW.  Steppage gait more pronounced with fatigue   Stairs            Wheelchair Mobility    Modified Rankin (Stroke Patients Only)       Balance Overall balance assessment: Needs assistance   Sitting balance-Leahy Scale: Good     Standing balance support: No upper extremity supported;Single extremity supported Standing balance-Leahy Scale: Fair                      Cognition  Arousal/Alertness: Awake/alert Behavior During Therapy: WFL for tasks assessed/performed Overall Cognitive Status: Within Functional Limits for tasks assessed                      Exercises General Exercises - Lower Extremity Hip ABduction/ADduction: AROM;Strengthening;Both;10 reps;Standing Hip Flexion/Marching: AROM;Strengthening;Both;10 reps;Standing Mini-Sqauts: AROM;Strengthening;Both;10 reps;Standing    General Comments General comments (skin integrity, edema, etc.): SpO2 on 4L dropped to 84% during gait or exercise, Quick to recover with coached efficient breathing techn.  On 6L, [place] dropped to 85/86% with exertion.      Pertinent Vitals/Pain Pain Assessment: No/denies pain    Home Living                      Prior Function            PT Goals (current goals can now be found in the care plan section) Acute Rehab PT Goals Time For Goal Achievement: 08/06/14 Potential to Achieve Goals: Good Progress towards PT goals: Progressing toward goals    Frequency  Min 3X/week    PT Plan Current plan remains appropriate    Co-evaluation             End of Session Equipment Utilized During Treatment: Oxygen Activity Tolerance: Patient tolerated treatment well Patient left: in chair;with call bell/phone within reach     Time: 3729-0211 PT Time Calculation (min) (ACUTE ONLY): 44 min  Charges:  $Gait Training: 8-22 mins $Therapeutic Exercise: 8-22 mins $Self Care/Home Management: 8-22  G Codes:      Timothy Durham, Eliseo Gum 08/01/2014, 12:39 PM  08/01/2014  [place], PT [phone]  (pager)

## 2014-08-01 NOTE — Progress Notes (Addendum)
Patient ID: Broly Klepinger, male   DOB: [birth year], 81 y.o.   MRN: [id number]   Mr. Hartling is an 81 yo male with a history of prior CVA in 2000 secondary to PAF, nonischemic cardimoyopathy s/p [employer] CRT-P (02/17/14), chronic systolic HF (EF 30-35%), non-obstructive CAD, HTN, CKD stage IV, HLD, PAD and bioprosthetic AVR.   Followed closely in HF clinic and recently doing quite well. Diuretics had to be cut back a bit due to hypotension. Weight has been stable around 154 pounds.   Admitted 4/24 ago with worsening SOB with fevers and chills with WBC 12.2 and left shift. Weight stable. Initial CXR with mild vascular congestion. Felt to have acute bronchitis. However dyspnea got worse and CXR 4/26 showed worsening bilateral infiltrates ? HF vs multifocal PNA. IV Lasix and abx initially, has completed antibiotic course.  Steroids increased.   Gradual improvement, walking more.  More lower extremity edema also.  CXR looking better.   CT chest with diffuse, bilateral consolidated airspace disease.    Barium swallow with "nonspecific esophageal dysmotility disorder."   Scheduled Meds: . allopurinol  200 mg Oral Daily  . atorvastatin  40 mg Oral QPM  . carvedilol  6.25 mg Oral BID WC  . clopidogrel  75 mg Oral Daily  . DULoxetine  60 mg Oral Daily  . furosemide  40 mg Intravenous Once  . hydrALAZINE  12.5 mg Oral BID  . ipratropium-albuterol  3 mL Nebulization TID  . isosorbide mononitrate  30 mg Oral Daily  . multivitamin with minerals  1 tablet Oral Daily  . pantoprazole  80 mg Oral Daily  . predniSONE  40 mg Oral Q breakfast  . sodium chloride  3 mL Intravenous Q12H  . tamsulosin  0.8 mg Oral QHS  . [START ON 08/02/2014] torsemide  20 mg Oral QPM  . [START ON 08/02/2014] torsemide  40 mg Oral Q breakfast  . Warfarin - Pharmacist Dosing Inpatient   Does not apply q1800   Continuous Infusions: . heparin 950 Units/hr (07/31/14 1923)   PRN Meds:.acetaminophen, albuterol   Filed Vitals:   07/31/14 2057 08/01/14 0528 08/01/14 0915 08/01/14 0918  BP:  116/42    Pulse:  88    Temp:  97.2 F (36.2 C)    TempSrc:  Oral    Resp:  18    Height:      Weight:  148 lb 9.6 oz (67.405 kg)    SpO2: 97% 97% 89% 96%    Intake/Output Summary (Last 24 hours) at 08/01/14 1341 Last data filed at 08/01/14 1047  Gross per 24 hour  Intake 1270.01 ml  Output   1103 ml  Net 167.01 ml    LABS: Basic Metabolic Panel:  Recent Labs  40/98/11 0458 08/01/14 0333  NA 136 140  K 4.3 4.6  CL 94* 95*  CO2 32 34*  GLUCOSE 127* 106*  BUN 82* 78*  CREATININE 1.91* 2.02*  CALCIUM 8.8* 9.3   Liver Function Tests: No results for input(s): AST, ALT, ALKPHOS, BILITOT, PROT, ALBUMIN in the last 72 hours. No results for input(s): LIPASE, AMYLASE in the last 72 hours. CBC:  Recent Labs  07/31/14 0458 08/01/14 1137  WBC 18.3* 22.0*  HGB 9.5* 9.7*  HCT 27.7* 29.2*  MCV 91.1 95.4  PLT 362 390   Cardiac Enzymes: No results for input(s): CKTOTAL, CKMB, CKMBINDEX, TROPONINI in the last 72 hours. BNP: Invalid input(s): POCBNP D-Dimer: No results for input(s): DDIMER in the last 72 hours. Hemoglobin  A1C: No results for input(s): HGBA1C in the last 72 hours. Fasting Lipid Panel: No results for input(s): CHOL, HDL, LDLCALC, TRIG, CHOLHDL, LDLDIRECT in the last 72 hours. Thyroid Function Tests: No results for input(s): TSH, T4TOTAL, T3FREE, THYROIDAB in the last 72 hours.  Invalid input(s): FREET3 Anemia Panel: No results for input(s): VITAMINB12, FOLATE, FERRITIN, TIBC, IRON, RETICCTPCT in the last 72 hours.  RADIOLOGY: Dg Chest 2 View  07/31/2014   CLINICAL DATA:  Acute respiratory failure with hypoxia. Pt hx COPD, CAD  EXAM: CHEST  2 VIEW  COMPARISON:  07/30/2014  FINDINGS: Patchy areas of airspace and interstitial opacities in the lungs are stable from most recent prior exam. No new lung abnormalities. No significant pleural effusion and no pneumothorax.  Changes from cardiac surgery  and aortic valve replacement are stable. Cardiac silhouette is mildly enlarged. No mediastinal or hilar masses. Left anterior chest wall sequential pacemaker is stable and well positioned.  IMPRESSION: No change from the previous day's study. Bilateral patchy airspace and interstitial lung opacities are stable. No new abnormalities.   Electronically Signed   By: Amie Portland M.D.   On: 07/31/2014 13:02   Dg Chest 2 View  07/30/2014   CLINICAL DATA:  Hypoxia.  Weakness.  EXAM: CHEST  2 VIEW  COMPARISON:  07/28/2014.  FINDINGS: There is improvement from 07/28/2014, with partial clearance of the multifocal bilateral airspace opacities although significant opacities persist, left greater than right. Mild right lateral base pleural thickening persists. No significant effusion is evident. There is moderate unchanged cardiomegaly. There is prior sternotomy and aortic valvuloplasty. There are intact appearances of the transvenous leads.  IMPRESSION: Slight improvement. Multifocal bilateral airspace opacities persist, partially cleared.   Electronically Signed   By: Ellery Plunk M.D.   On: 07/30/2014 06:50   Dg Chest 2 View  07/28/2014   CLINICAL DATA:  Respiratory failure .  EXAM: CHEST  2 VIEW  COMPARISON:  07/22/2014 .  CT 07/25/2014.  FINDINGS: Mediastinum and hilar structures are normal. Prior cardiac valve replacement. Cardiac pacer noted with lead tips in stable position. Persistent multifocal bilateral pulmonary infiltrates, no interim change. Small right pleural effusion. No pneumothorax.  IMPRESSION: 1. Persistent multifocal bilateral pulmonary infiltrates most consistent with pneumonia. No significant interim change.  2. Prior cardiac valve replacement. Cardiac pacer stable position. Stable cardiomegaly .   Electronically Signed   By: Maisie Fus  Register   On: 07/28/2014 08:33   Dg Chest 2 View  07/20/2014   CLINICAL DATA:  Shortness of breath.  Pacemaker.  EXAM: CHEST  2 VIEW  COMPARISON:  05/04/2014.   FINDINGS: Mildly increased cardiac silhouette. Triple lead pacer unchanged from priors. Moderate vascular congestion without overt failure or significant consolidation. Mild to moderate pleural effusions, greater on the RIGHT. Aortic valvular placement with prior median sternotomy. No acute osseous findings.  IMPRESSION: Cardiomegaly with stable appearance of the transvenous pacer.  Moderate vascular congestion without overt failure. Overall improved from prior radiograph in February.   Electronically Signed   By: Davonna Belling M.D.   On: 07/20/2014 02:23   Ct Chest Wo Contrast  07/25/2014   CLINICAL DATA:  Hypoxia and progressive shortness of breath for the past 3 days. Nonproductive cough and wheeze  EXAM: CT CHEST WITHOUT CONTRAST  TECHNIQUE: Multidetector CT imaging of the chest was performed following the standard protocol without IV contrast.  COMPARISON:  None.  FINDINGS: THORACIC INLET/BODY WALL:  There is a biventricular pacer from the left with leads in unremarkable position.  No acute findings at the thoracic inlet.  There is an incidental 1 cm intramuscular lipoma in the right chest wall.  MEDIASTINUM:  Cardiomegaly which is chronic based on chest x-rays. No pericardial effusion. The patient is status post median sternotomy with aortic valve replacement. The ascending aorta measures 41 mm in diameter. There is mild enlargement of mediastinal lymph nodes which could be reactive to the presumed pneumonia or sequela of remote granulomatous disease (there is also scattered nodal coarse calcification). No acute vascular findings. Negative esophagus.  LUNG WINDOWS:  There is patchy consolidative and ground-glass opacities in all lobes. Airspace disease is most extensive in the left upper lobe and most dense in the posterior segment right upper lobe. Small bilateral pleural effusions with interlobular septal thickening. No cavitation. No evidence of loculated effusion.  UPPER ABDOMEN:  Granulomatous changes in  the liver and spleen.  OSSEOUS:  No acute findings.  IMPRESSION: 1. Multilobar pneumonia. 2. Superimposed interstitial edema and small pleural effusions. 3. Chronic and postoperative findings noted above.   Electronically Signed   By: Marnee Spring M.D.   On: 07/25/2014 08:00   Dg Esophagus  07/29/2014   CLINICAL DATA:  Dysphagia.  Choking.  Bilateral pneumonia.  EXAM: ESOPHOGRAM/BARIUM SWALLOW  TECHNIQUE: Combined double contrast and single contrast examination performed using effervescent crystals, thick barium liquid, and thin barium liquid.  FLUOROSCOPY TIME:  Radiation Exposure Index (as provided by the fluoroscopic device): Dose area product: 829.79uGy*m^2  COMPARISON:  Radiographs 07/28/2014.  Chest CT 07/25/2014.  FINDINGS: Pharyngeal phase of swallowing was normal. The study was carried out in the AP projection due to patient condition. Nonspecific esophageal dysmotility was present without stricture. 0/3 primary waves reach the gastroesophageal junction. Tertiary contractions were observed. Fetal on esophagus incidentally noted.  There is no bear ear passage of the 13 mm barium tablet challenge however the tablet remain static in the lower esophagus just proximal to the gastroesophageal junction for several min. No gastroesophageal reflux could be elicited with water siphon test.  Esophageal stasis was present in the supine position. Esophageal folds appeared within normal limits.  IMPRESSION: 1. No esophageal stricture or mass. 2. Nonspecific esophageal dysmotility disorder with with tertiary contractions. Stasis in the supine position.   Electronically Signed   By: Andreas Newport M.D.   On: 07/29/2014 09:39   US Renal  07/20/2014   CLINICAL DATA:  Acute kidney injury.  Hypertension.  EXAM: RENAL/URINARY TRACT ULTRASOUND COMPLETE  COMPARISON:  None.  FINDINGS: Right Kidney:  Length: 10.3 cm. Echogenicity within normal limits. No mass or hydronephrosis visualized. Diffuse cortical thinning with  prominent renal sinus fat.  Left Kidney:  Length: 11.1 cm. Echogenicity within normal limits. No mass or hydronephrosis visualized. Diffuse cortical thinning with prominent renal sinus fat.  Bladder:  Multiple diverticula. The largest measures 4.9 cm in maximum diameter. Small amount of internal debris with no definite calculi  IMPRESSION: 1. Diffuse bilateral renal cortical atrophy. 2. Multiple bladder diverticula. 3. Small amount of debris in the bladder with no definite calculi.   Electronically Signed   By: Beckie Salts M.D.   On: 07/20/2014 12:27   Dg Chest Port 1 View  08/01/2014   CLINICAL DATA:  81 year old male with a history of pulmonary infiltrates and shortness of breath.  EXAM: PORTABLE CHEST - 1 VIEW  COMPARISON:  Multiple prior, most recent 07/31/2014, 07/30/2014  FINDINGS: Cardiomediastinal silhouette unchanged.  Atherosclerosis.  Unchanged cardiac pacing device with 3 leads in place.  Bilateral airspace disease, involving  the right greater than left bases and the left greater than right apices. Distribution is similar to comparison plain film. Over the course of several chest x-ray this has improved, dating to 07/22/2014 and 07/20/2014.  Trace pleural effusions.  Calcified right hilar lymph nodes.  IMPRESSION: Persisting bilateral airspace disease, which is unchanged since the most recent plain films though has improved since the comparisons of 07/20/2014.  Persisting small pleural effusions.  Signed,  Yvone Neu. Loreta Ave, DO  [facility]  [facility]   Electronically Signed   By: Gilmer Mor D.O.   On: 08/01/2014 07:19   Dg Chest Port 1 View  07/22/2014   CLINICAL DATA:  Acute onset of hypoxemia.  Initial encounter.  EXAM: PORTABLE CHEST - 1 VIEW  COMPARISON:  Chest radiograph performed 07/20/2014  FINDINGS: The lungs are well-aerated. Worsening bilateral midlung airspace opacification raises concern for multifocal pneumonia. Small bilateral pleural  effusions are seen, and underlying interstitial edema cannot be excluded. Vascular congestion is noted. No pneumothorax is seen.  The cardiomediastinal silhouette is mildly enlarged. The patient is status post median sternotomy. A pacemaker is noted overlying the left chest wall, with leads ending overlying the right atrium and right ventricle. No acute osseous abnormalities are seen.  IMPRESSION: 1. Worsening bilateral mid lung airspace opacification raises concern for multifocal pneumonia. 2. Small bilateral pleural effusions, with vascular congestion and mild cardiomegaly. Underlying interstitial edema cannot be excluded.   Electronically Signed   By: Roanna Raider M.D.   On: 07/22/2014 04:30    PHYSICAL EXAM General: NAD. Sitting in chair Neck: JVP  8-9 cm, no thyromegaly or thyroid nodule.  Lungs: + crackles Decreased breath sounds at bases bilaterally.   CV: Nondisplaced PMI.  Heart regular S1/S2, no S3/S4, 2/6 HSM LLSB/apex.  1+ edema 1/2 to knees bilaterally Abdomen: Soft, nontender, no hepatosplenomegaly, no distention.  Neurologic: Alert and oriented x 3.  Psych: Normal affect. Extremities: No clubbing or cyanosis.   TELEMETRY: Reviewed telemetry pt in NSR with BiV pacing    Assessment:   1. Acute respiratory failure: Suspect CHF + possible PNA 2. Acute on chronic systolic heart failure EF 30-35% 3. Acute bronchitis 4. CKD, stage III-IV 5. PAF in NSR on amio - CHDSVASC 5. On warfarin 6. Multifocal airspace disease: ?amiodarone toxicity versus hypersensitivity pneumonitis vs aspiration PNA.    Plan/Discussion:   Mild volume overload today.  I will give him a dose of IV Lasix this afternoon and increase torsemide to 40 qam/20 qpm starting tomorrow (this has been his home dose). Continue current dose of carvedilol, hydralazine, and imdur. No ACEI with CKD.   Workup of airspace disease per pulmonary.  He is now off amiodarone for ?lung toxicity.  He has been seen by Dr  Kendrick Fries, plan to treat with tapering prednisone for possible organizing PNA from amiodarone toxicity.  No bronchoscopy yet.  Warfarin restarted, will keep him off Plavix for now in case bronch/biopsy needed in the near future.    He is near being ready for discharge.  Will arrange CHF clinic followup, will also need followup with pulmonary.       Marca Ancona  08/01/2014 1:41 PM

## 2014-08-01 NOTE — Progress Notes (Signed)
PROGRESS NOTE    Timothy Durham DGU:[id number] DOB: Jan 28, 1931 DOA: 07/19/2014 PCP: Dennis Bast, MD  HPI/Brief narrative 81 year old male with history of nonischemic cardiomyopathy with EF of 30-35% as per last echo, NSTEMI in 2015 with cardiac cath showing nonobstructive CAD, chronic kidney disease stage III, history of stroke in 2000, history of bioprosthetic aVR, paroxysmal A. fib on chronic Coumadin, tobacco use with? COPD who presented with progressive shortness of breath for the past 3 days associated with nonproductive cough and wheezing. He reports being compliant with his medications. Chest x-ray on admission showing moderate vascular congestion. Labs showed elevated BNP of 1400. Patient admitted for acute hypoxic respiratory failure due to acute bronchitis. During the hospital stay he became increasingly short of breath and wheezy with follow-up chest x-ray showing multifocal pneumonia and volume overload. Pulmonary, ID and cardiology were consulted. At this time exact etiology of his hypoxia and CT chest with diffuse bilateral consolidated airspace disease, is unclear. Consideration for bronchoscopy.   Assessment/Plan:  Acute hypoxic respiratory failure - Initially admitted for acute bronchitis but during the hospital course became increasingly short of breath - broadened antibiotic coverage with empiric vancomycin and cefepime for HCAP,O2 supplement/ duonebs/ oral prednisone.  - passed swallow eval on 4/27 with regular diet and thin liquid. - 4/29 reported worsening of sob, chest CT showed persistent patchy consolidative and ground glass opacities in all lobes, increased oxygen requirement, - pulmology and ID consulted. - Sputum sample recollected: Normal OP flora. RSV panel negative,  - ID recommended to stop abx due to clinical course not consistent with typical bacterial infection, vanc/cefepime d/ced on 4/29. Azithromycin discontinued 5/1. ID signed off 5/2  - As per cardiology  follow-up 5/2, doubt amiodarone toxicity since only started in December 2015. Cardiology have discussed with pulmonology and have stopped amiodarone for now. Continue steroids. - Pulmonology input from 5/2 appreciated: Broad differential diagnosis: Amiodarone lung toxicity, hypersensitivity pneumonitis, less likely occult malignancy like bronchoalveolar cell carcinoma versus aspiration pneumonitis. - Barium swallow: Some nonspecific esophageal dysmotility. - Steroids changed and Solu-Medrol started 5/2 - Patient still requires high flow oxygen with minimal activity - Torsemide resumed by cardiology on 5/3. - Chest x-ray slightly better. - As per pulmonology follow-up 5/4: Risk of bronchoscopy outweighs benefit and hence plans to follow daily chest x-ray, keep off of amiodarone for 6 weeks, treated with prednisone for 6 weeks and if no improvement over that course or if worsens then consider bronchoscopy - Started Heparin gtt 5/4. - As per pulmonology 5/5: Acute respiratory failure with hypoxia presumably due to organizing pneumonia from amiodarone: Recommendations are to hold amiodarone indefinitely, slow prednisone taper, wean oxygen for >88%. Coumadin resumed 5/5. Pulmonary want him off Plavix for now in case [place]/biopsy needed in the future. - Slowly improving.  Non-ischemic cardiomyopathy/acute on chronic systolic CHF - euvolemic on admission but developed some volume overload likely precipitated by acute respiratory failure.  - Continue Coreg hydralazine, imdur and statin..  - Treated with IV Lasixand oral Aldactone. D/ced due to cr elevation. - Not on ACEi/ ARB due to CKD - Advanced heart failure team follow-up appreciated and do not believe that the primary process here is heart failure. Diurese as needed - Coumadin (last dose 5/1) and Plavix (last dose 5/2) held for possible bronchoscopy.  - Torsemide resumed 5/3   Acute on chronic kidney disease stage III - Baseline creatinine  around 1.8-2.  - [place] abd shows medical renal ds. Repeat ua no infection. Avoid renal toxin. Renal dosing meds. -  Cr worsening, diuretic held since 4/30. - creatinine has improved from 2.18 > 1.8 >1.9 >2.02. - Follow creatinine while diuretics have been resumed.   History of CVA and peripheral vascular disease - PTA on Coumadin, Plavix and statin. Patient on both Coumadin and Plavix for A. fib (had TIA on Coumadin alone). -Anticoagulation resumed since no plans for bronchoscopy at this time.  Atrial fibrillation - Pace rhythm, Rate controlled.  - Amiodarone discontinued 5/2 - Anticoagulation held and resumed  GERD Continue PPI  Malnutrition,  - low albumin, nutrition consult obtained.  BPH/urinary retention  - of >600 on 5/1, in and out cathx1, increased flomax.  Chronic anemia - Stable  Leukocytosis - Possibly related to steroids  Diarrhea - Resolved. C. difficile PCR negative.  Code Status: DNR Family Communication: Discussed with patient's spouse at bedside on 5/5. Disposition Plan: DC home possibly 5/7   Consultants:  Advanced CHF team  PCCM  ID-signed off 5/2  Procedures:  None  Antibiotics:  Azithromycin- completed   Subjective: Dyspnea continues to improve. Denies any other complaints.  Objective: Filed Vitals:   08/01/14 0528 08/01/14 0915 08/01/14 0918 08/01/14 1438  BP: 116/42   135/46  Pulse: 88   71  Temp: 97.2 F (36.2 C)   97.6 F (36.4 C)  TempSrc: Oral   Axillary  Resp: 18   18  Height:      Weight: 67.405 kg (148 lb 9.6 oz)     SpO2: 97% 89% 96% 100%    Intake/Output Summary (Last 24 hours) at 08/01/14 1646 Last data filed at 08/01/14 1533  Gross per 24 hour  Intake 1730.01 ml  Output   1203 ml  Net 527.01 ml   Filed Weights   07/30/14 0540 07/31/14 0510 08/01/14 0528  Weight: 68.266 kg (150 lb 8 oz) 67.359 kg (148 lb 8 oz) 67.405 kg (148 lb 9.6 oz)     Exam:  General exam: Pleasant elderly male sitting up  comfortably in chair Respiratory system: Lungs are actually sounding better with improved breath sounds and diminished crackles. No increased work of breathing. Cardiovascular system: S1 & S2 heard, RRR. No JVD, murmurs, gallops, clicks. Trace bilateral leg edema. Not on telemetry. Gastrointestinal system: Abdomen is nondistended, soft and nontender. Normal bowel sounds heard. Central nervous system: Alert and oriented. No focal neurological deficits. Extremities: Symmetric 5 x 5 power.   Data Reviewed: Basic Metabolic Panel:  Recent Labs Lab 07/27/14 0539 07/28/14 0522 07/29/14 0600 07/30/14 0548 07/31/14 0458 08/01/14 0333  NA 132* 134* 132* 134* 136 140  K 4.1 4.3 4.5 4.8 4.3 4.6  CL 92* 95* 95* 92* 94* 95*  CO2 32 34*  GLUCOSE 89 104* 143* 124* 127* 106*  BUN 82* 82* 79* 86* 82* 78*  CREATININE 2.26* 2.18* 1.83* 1.91* 1.91* 2.02*  CALCIUM 9.4 9.9 9.5 9.3 8.8* 9.3  MG 2.2  --   --   --   --   --    Liver Function Tests:  Recent Labs Lab 07/26/14 0407 07/27/14 0539 07/28/14 0522  AST 42* 30 26  ALT 51 44 38  ALKPHOS 130* 126 123  BILITOT 1.8* 1.2 1.1  PROT 6.2 6.0* 6.3*  ALBUMIN 2.2* 2.1* 2.1*   No results for input(s): LIPASE, AMYLASE in the last 168 hours. No results for input(s): AMMONIA in the last 168 hours. CBC:  Recent Labs Lab 07/26/14 0407 07/28/14 0522 07/30/14 0548 07/31/14 0458 08/01/14 1137  WBC 19.8* 19.6* 21.7*  18.3* 22.0*  HGB 9.4* 9.7* 10.2* 9.5* 9.7*  HCT 26.2* 27.5* 29.6* 27.7* 29.2*  MCV 87.9 88.4 90.8 91.1 95.4  PLT 340 367 375 362 390   Cardiac Enzymes: No results for input(s): CKTOTAL, CKMB, CKMBINDEX, TROPONINI in the last 168 hours. BNP (last 3 results)  Recent Labs  02/24/14 1606 02/26/14 1623 03/06/14 1129  PROBNP 2973.0* 18948.0* 5943.0*   CBG: No results for input(s): GLUCAP in the last 168 hours.  Recent Results (from the past 240 hour(s))  Culture, expectorated sputum-assessment     Status: None    Collection Time: 07/23/14 11:23 PM  Result Value Ref Range Status   Specimen Description SPUTUM  Final   Special Requests NONE  Final   Sputum evaluation   Final    MICROSCOPIC FINDINGS SUGGEST THAT THIS SPECIMEN IS NOT REPRESENTATIVE OF LOWER RESPIRATORY SECRETIONS. PLEASE RECOLLECT. RESULT CALLED TO, READ BACK BY AND VERIFIED WITH: NOTIFIED G RUSUL,RN 07/24/14 AT 0040 BY RHOLMES   Report Status 07/24/2014 FINAL  Final  Culture, expectorated sputum-assessment     Status: None   Collection Time: 07/24/14  9:14 PM  Result Value Ref Range Status   Specimen Description SPUTUM  Final   Special Requests Normal  Final   Sputum evaluation   Final    MICROSCOPIC FINDINGS SUGGEST THAT THIS SPECIMEN IS NOT REPRESENTATIVE OF LOWER RESPIRATORY SECRETIONS. PLEASE RECOLLECT. CALLED TO D.HART ,RN AT 0124 BY L.PITT 07/25/14    Report Status 07/25/2014 FINAL  Final  Culture, expectorated sputum-assessment     Status: None   Collection Time: 07/25/14  3:42 PM  Result Value Ref Range Status   Specimen Description SPUTUM  Final   Special Requests Normal  Final   Sputum evaluation   Final    THIS SPECIMEN IS ACCEPTABLE. RESPIRATORY CULTURE REPORT TO FOLLOW.   Report Status 07/25/2014 FINAL  Final  Culture, respiratory (NON-Expectorated)     Status: None   Collection Time: 07/25/14  3:42 PM  Result Value Ref Range Status   Specimen Description SPUTUM  Final   Special Requests NONE  Final   Gram Stain   Final    RARE WBC PRESENT, PREDOMINANTLY PMN RARE SQUAMOUS EPITHELIAL CELLS PRESENT RARE GRAM POSITIVE COCCI IN PAIRS Performed at [facility]    Culture   Final    NORMAL OROPHARYNGEAL FLORA Performed at [facility]    Report Status 07/31/2014 FINAL  Final  Respiratory virus panel     Status: None   Collection Time: 07/25/14  8:03 PM  Result Value Ref Range Status   Respiratory Syncytial Virus A Negative Negative Final   Respiratory Syncytial Virus B Negative Negative Final    Influenza A Negative Negative Final   Influenza B Negative Negative Final   Parainfluenza 1 Negative Negative Final   Parainfluenza 2 Negative Negative Final   Parainfluenza 3 Negative Negative Final   Metapneumovirus Negative Negative Final   Rhinovirus Negative Negative Final   Adenovirus Negative Negative Final    Comment: (NOTE) Performed At: [facility] [address] [place], [place] [id number] Mila Homer MD [id number]   Clostridium Difficile by PCR     Status: None   Collection Time: 07/30/14 12:51 PM  Result Value Ref Range Status   C difficile by pcr NEGATIVE NEGATIVE Final         Studies: Dg Chest 2 View  07/31/2014   CLINICAL DATA:  Acute respiratory failure with hypoxia. Pt hx COPD, CAD  EXAM: CHEST  2 VIEW  COMPARISON:  07/30/2014  FINDINGS: Patchy areas of airspace and interstitial opacities in the lungs are stable from most recent prior exam. No new lung abnormalities. No significant pleural effusion and no pneumothorax.  Changes from cardiac surgery and aortic valve replacement are stable. Cardiac silhouette is mildly enlarged. No mediastinal or hilar masses. Left anterior chest wall sequential pacemaker is stable and well positioned.  IMPRESSION: No change from the previous day's study. Bilateral patchy airspace and interstitial lung opacities are stable. No new abnormalities.   Electronically Signed   By: Amie Portland M.D.   On: 07/31/2014 13:02   Dg Chest Port 1 View  08/01/2014   CLINICAL DATA:  81 year old male with a history of pulmonary infiltrates and shortness of breath.  EXAM: PORTABLE CHEST - 1 VIEW  COMPARISON:  Multiple prior, most recent 07/31/2014, 07/30/2014  FINDINGS: Cardiomediastinal silhouette unchanged.  Atherosclerosis.  Unchanged cardiac pacing device with 3 leads in place.  Bilateral airspace disease, involving the right greater than left bases and the left greater than right apices. Distribution is similar to comparison plain  film. Over the course of several chest x-ray this has improved, dating to 07/22/2014 and 07/20/2014.  Trace pleural effusions.  Calcified right hilar lymph nodes.  IMPRESSION: Persisting bilateral airspace disease, which is unchanged since the most recent plain films though has improved since the comparisons of 07/20/2014.  Persisting small pleural effusions.  Signed,  Yvone Neu. Loreta Ave, DO  [facility]  [facility]   Electronically Signed   By: Gilmer Mor D.O.   On: 08/01/2014 07:19        Scheduled Meds: . allopurinol  200 mg Oral Daily  . atorvastatin  40 mg Oral QPM  . carvedilol  6.25 mg Oral BID WC  . clopidogrel  75 mg Oral Daily  . DULoxetine  60 mg Oral Daily  . hydrALAZINE  12.5 mg Oral BID  . ipratropium-albuterol  3 mL Nebulization TID  . isosorbide mononitrate  30 mg Oral Daily  . multivitamin with minerals  1 tablet Oral Daily  . pantoprazole  80 mg Oral Daily  . predniSONE  40 mg Oral Q breakfast  . sodium chloride  3 mL Intravenous Q12H  . tamsulosin  0.8 mg Oral QHS  . [START ON 08/02/2014] torsemide  20 mg Oral QPM  . [START ON 08/02/2014] torsemide  40 mg Oral Q breakfast  . Warfarin - Pharmacist Dosing Inpatient   Does not apply q1800   Continuous Infusions: . heparin 950 Units/hr (07/31/14 1923)    Principal Problem:   Acute respiratory failure Active Problems:   TIA 2007, Dec 2014 (Plavix added)   Coronary artery disease-moderate Oct 2015   CKD (chronic kidney disease), stage III   Moderate to Severe Mitral Regurgitation   PAD- LLE PTA, RCEA, attempted LICA stent   Tissue AVR 2006 (in [place])   NICM (nonischemic cardiomyopathy)   PAF   Hyperlipidemia   Chronic anticoagulation   Pacemaker- MDT BiV 02/17/14   Acute on chronic systolic CHF (congestive heart failure)   Chronic systolic heart failure   Acute renal failure superimposed on stage 3 chronic kidney disease   Pneumonia, organism unspecified   Acute  respiratory failure with hypoxia   Abnormal CT scan, chest   Diarrhea   Amiodarone pulmonary toxicity    Time spent: 20 minutes    Latorya Bautch, MD, FACP, FHM. [facility] Pager [phone]  If 7PM-7AM, please contact night-coverage [email or web address] Password [email or web address]  08/01/2014, 4:46 PM    LOS: 12 days

## 2014-08-01 NOTE — Progress Notes (Addendum)
Anticoagulation CONSULT NOTE -Follow up  Pharmacy Consult for heparin/Coumadin Indication: atrial fibrillation, bioprosthetic AVR  Allergies  Allergen Reactions  . Ambien [Zolpidem] Other (See Comments)    Hallucinations  . Ativan [Lorazepam] Other (See Comments)    hallucinations  . Oxycontin [Oxycodone] Other (See Comments)    hallucinations  . Penicillins     Unknown childhood reaction   . Sulfa Antibiotics     unknown    Patient Measurements: Height: 5\' 10"  (177.8 cm) Weight: 148 lb 9.6 oz (67.405 kg) (scale b) IBW/kg (Calculated) : 73  Vital Signs: Temp: 97.2 F (36.2 C) (05/06 0528) Temp Source: Oral (05/06 0528) BP: 116/42 mmHg (05/06 0528) Pulse Rate: 88 (05/06 0528)  Labs:  Recent Labs  07/30/14 0548 07/31/14 0101 07/31/14 0458 08/01/14 0333  HGB 10.2*  --  9.5*  --   HCT 29.6*  --  27.7*  --   PLT 375  --  362  --   LABPROT 22.5*  --  21.8* 19.4*  INR 1.96*  --  1.89* 1.62*  HEPARINUNFRC  --  0.36 0.35 0.50  CREATININE 1.91*  --  1.91* 2.02*    Estimated Creatinine Clearance: 27.3 mL/min (by C-G formula based on Cr of 2.02).  Assessment: 81yo male on pta coumadin for PAF. Therapeutic heparin level. No bleeding reported.  Coumadin resumed last night.  This morning's CBC stable.  PTA warfarin dose: 2mg  daily  Goal of Therapy:  Heparin level 0.3-0.7 units/ml   Plan:  Continue heparin drip at 950 units/hr.  Recheck heparin level and CBC daily with morning labs. Coumadin 2 mg po x 1 tonight. Follow for s/sx bleeding  Thanks for allowing pharmacy to be a part of this patient's care.  Tad Moore, BCPS  Clinical Pharmacist Pager [phone]  08/01/2014 9:42 AM

## 2014-08-01 NOTE — Progress Notes (Signed)
   Name: Timothy Durham MRN: [id number] DOB: 02/26/1931    ADMISSION DATE:  07/19/2014 CONSULTATION DATE:  4/29  REFERRING MD :  Roda Shutters  CHIEF COMPLAINT:  Pneumonia not improving   BRIEF PATIENT DESCRIPTION:  81 year old male w/ known sig h/o systolic CM (EF 30-35%), CKD stage IV, prior AVR, and PAF. Admitted 4/23 w/ working dx of PNA w/ element of decompensated HF/pulmonary edema. Treated w/ broad spec abx and lasix until creatinine climbed and interrogation of optivol device on pacemaker suggested euvolemia on 4/27. 4/29 early evening suddenly more short of breath. CT chest obtained showing diffuse ground-glass opacities bilateral. PCCM asked to see re: "pneumonia not improving"   SIGNIFICANT EVENTS    STUDIES:  CT chest 4/29: diffuse patchy GG opacifications bilaterally, small bilateral pleural effusions.  Barium swallow 5/3>>> No esophageal stricture or mass, nonspecific esophageal dysmotility, Stasis in the supine position.  SUBJECTIVE:  Feels better today Some ankle swelling  VITAL SIGNS: Temp:  [97.2 F (36.2 C)-97.4 F (36.3 C)] 97.2 F (36.2 C) (05/06 0528) Pulse Rate:  [75-88] 88 (05/06 0528) Resp:  [18] 18 (05/06 0528) BP: (116-136)/(40-51) 116/42 mmHg (05/06 0528) SpO2:  [89 %-98 %] 96 % (05/06 0918) Weight:  [148 lb 9.6 oz (67.405 kg)] 148 lb 9.6 oz (67.405 kg) (05/06 0528)  PHYSICAL EXAMINATION: Gen: no distress, in chair  HENT: OP clear, TM's clear, neck supple PULM: Crackles left lung more than right, good effort CV: RRR, no mgr, trace ankle edema GI: BS+, soft, nontender Derm: no cyanosis or rash Psyche: normal mood and affect     Recent Labs Lab 07/30/14 0548 07/31/14 0458 08/01/14 0333  NA 134* 136 140  K 4.8 4.3 4.6  CL 92* 94* 95*  CO2 30 32 34*  BUN 86* 82* 78*  CREATININE 1.91* 1.91* 2.02*  GLUCOSE 124* 127* 106*    Recent Labs Lab 07/30/14 0548 07/31/14 0458 08/01/14 1137  HGB 10.2* 9.5* 9.7*  HCT 29.6* 27.7* 29.2*  WBC 21.7* 18.3*  22.0*  PLT 375 362 390   CT chest 4/29 images personally reviewed> bilateral patchy consolidation and ground glass in an upper lobe distribution, food in esophagus and in stomach  ASSESSMENT / PLAN:  Acute hypoxic respiratory failure in setting of diffuse bilateral infiltrates.. Improved, I assume this is amiodarone lung toxicity.  Plan Continue supplemental O2 and titrate as able > will request home O2 eval Continue prednisone for 6 weeks > 40mg  daily 2 weeks, 30mg  daily 2 weeks, then 20mg  daily 2 weeks Continue to hold amiodarone [place] to resume warfarin, please hold plavix incase he needs bronch F/u with [place] in 2 weeks with CXR> will arrange post D/C, his wife has my card  Dysphagia:  Plan  Nonspecific esophageal dysmotility on barium swallow  Cont diet per speech recs   NICM (EF 30-35%), CKD stage III, h/o CVA, atrial fib, and GERD Plan Per Cardiology, ID and internal medicine teams.   Heber Garrard, MD [place] [facility] Pager: [phone] Cell: [phone] If no response, call [phone]   08/01/2014 2:03 PM

## 2014-08-01 NOTE — Progress Notes (Signed)
CARDIAC REHAB PHASE I   PRE:  Rate/Rhythm: npt on tele  BP:  Supine:   Sitting: 126/50  Standing:    SaO2: 95% 3L  MODE:  Ambulation: 440 ft   POST:  Rate/Rhythm: 100  BP:  Supine:   Sitting: 132/60  Standing:    SaO2: 92% 6L hall and 96% in room 1405-1435 Walked pt on 6L and sats stayed up. Pt walked 440 ft on 6L with asst x 1 with fairly steady gait. Tolerated well. Stopped once to rest. Talked with pt's wife. Would recommend rollator as walker so pt could sit when tired. Wife mentioned shower chair but 3 in 1 might be better in case he needs it as BSC at some point. Wife stated would discuss with Dr. Shirlee Latch. Pt happy at progress today and stated likes to walk.   Luetta Nutting, RN BSN  08/01/2014 2:32 PM

## 2014-08-02 ENCOUNTER — Inpatient Hospital Stay (HOSPITAL_COMMUNITY): Payer: Medicare HMO

## 2014-08-02 DIAGNOSIS — G459 Transient cerebral ischemic attack, unspecified: Secondary | ICD-10-CM

## 2014-08-02 DIAGNOSIS — G458 Other transient cerebral ischemic attacks and related syndromes: Secondary | ICD-10-CM

## 2014-08-02 DIAGNOSIS — G451 Carotid artery syndrome (hemispheric): Secondary | ICD-10-CM

## 2014-08-02 LAB — BASIC METABOLIC PANEL
Anion gap: 8 (ref 5–15)
BUN: 68 mg/dL — ABNORMAL HIGH (ref 6–20)
CO2: 35 mmol/L — ABNORMAL HIGH (ref 22–32)
Calcium: 8.8 mg/dL — ABNORMAL LOW (ref 8.9–10.3)
Chloride: 94 mmol/L — ABNORMAL LOW (ref 101–111)
Creatinine, Ser: 1.69 mg/dL — ABNORMAL HIGH (ref 0.61–1.24)
GFR calc Af Amer: 42 mL/min — ABNORMAL LOW (ref >60)
GFR calc non Af Amer: 36 mL/min — ABNORMAL LOW (ref >60)
Glucose, Bld: 98 mg/dL (ref 70–99)
Potassium: 4.2 mmol/L (ref 3.5–5.1)
Sodium: 137 mmol/L (ref 135–145)

## 2014-08-02 LAB — PROTIME-INR
INR: 1.52 — ABNORMAL HIGH (ref 0.00–1.49)
Prothrombin Time: 18.5 seconds — ABNORMAL HIGH (ref 11.6–15.2)

## 2014-08-02 LAB — CBC
HCT: 27.4 % — ABNORMAL LOW (ref 39.0–52.0)
Hemoglobin: 9.3 g/dL — ABNORMAL LOW (ref 13.0–17.0)
MCH: 32.2 pg (ref 26.0–34.0)
MCHC: 33.9 g/dL (ref 30.0–36.0)
MCV: 94.8 fL (ref 78.0–100.0)
PLATELETS: 384 10*3/uL (ref 150–400)
RBC: 2.89 MIL/uL — ABNORMAL LOW (ref 4.22–5.81)
RDW: 17.8 % — AB (ref 11.5–15.5)
WBC: 14.1 10*3/uL — ABNORMAL HIGH (ref 4.0–10.5)

## 2014-08-02 LAB — HEPARIN LEVEL (UNFRACTIONATED): Heparin Unfractionated: 0.61 [IU]/mL (ref 0.30–0.70)

## 2014-08-02 MED ORDER — WARFARIN SODIUM 3 MG PO TABS
3.0000 mg | ORAL_TABLET | Freq: Once | ORAL | Status: AC
  Administered 2014-08-02: 3 mg via ORAL
  Filled 2014-08-02: qty 1

## 2014-08-02 NOTE — Progress Notes (Signed)
Patient states left thoracic pain that comes and goes and describes the pain as "achy." VSS, EKG- shows V-Paced. Tylenol given per Prn order. Notified on-call hospitalist of issue.Marland Kitchen

## 2014-08-02 NOTE — Significant Event (Addendum)
Rapid Response Event Note  Overview: Time Called: 0826 Arrival Time: 0828 Event Type: Neurologic  Initial Focused Assessment:  Called by primary RN for patient who fell while in bathroom, RN observed him on his left side and noted to have left side weakness.  On my arrival to patients room RN at bedside.  Patient lying in bed with oxygen, noted to have some aphasia and garbled speech, and facial droop (which nurse notes as old).   Interventions:  VSS 115/65, HR 85 Sats 95% on 2 lpm nasal cannula.  Placed patient on tele monitor.  Spoke with Dr. Waymon Amato, orders received, code stroke activated.  NIHSS 4.    LSN [id number].  Patient states he had a bowel movement and when he stood up became dizzy and fell, he does not think he hit his head.  Patient noted to have a red bruise like area in front of left ear, RN unsure if that was present prior to fall.  Patient transported to CT scan via bed with monitor and oxygen in place.  Dr Thad Ranger  In CT scan on arrival.  Swallow screen done, patient passed.  Primary RN updated on care.  Dr. Waymon Amato at bedside, Wife arrived to bedside.   Event Summary:  RN to call if assistance needed   at      at          [facility], [place]

## 2014-08-02 NOTE — Progress Notes (Signed)
Called to room by Charge. Patient had went to the bathroom, and fallen on the floor while in the bathroom. He pulled the bathroom call bell, and that's how staff was able to help him.  Pt was lying on his left side.  This morning patient was alert and oriented. After the fall he was not able to state the year. Also his speech is somewhat garbled. He is able to lift his hands and move his legs as he was before the fall. Rapid response has been called and [place] has been called. Vital signs stable.

## 2014-08-02 NOTE — Progress Notes (Signed)
PROGRESS NOTE    Timothy Durham AST:[id number] DOB: 01-Oct-1930 DOA: 07/19/2014 PCP: Dennis Bast, MD  HPI/Brief narrative 81 year old male with history of nonischemic cardiomyopathy with EF of 30-35% as per last echo, NSTEMI in 2015 with cardiac cath showing nonobstructive CAD, chronic kidney disease stage III, history of stroke in 2000, history of bioprosthetic aVR, paroxysmal A. fib on chronic Coumadin, tobacco use with? COPD who presented with progressive shortness of breath for the past 3 days associated with nonproductive cough and wheezing. He reports being compliant with his medications. Chest x-ray on admission showing moderate vascular congestion. Labs showed elevated BNP of 1400. Patient admitted for acute hypoxic respiratory failure due to acute bronchitis. During the hospital stay he became increasingly short of breath and wheezy with follow-up chest x-ray showing multifocal pneumonia and volume overload. Pulmonary, ID and cardiology were consulted. At this time exact etiology of his hypoxia and CT chest with diffuse bilateral consolidated airspace disease, is unclear. Consideration for bronchoscopy.   Assessment/Plan:  Acute hypoxic respiratory failure - Initially admitted for acute bronchitis but during the hospital course became increasingly short of breath - broadened antibiotic coverage with empiric vancomycin and cefepime for HCAP,O2 supplement/ duonebs/ oral prednisone.  - passed swallow eval on 4/27 with regular diet and thin liquid. - 4/29 reported worsening of sob, chest CT showed persistent patchy consolidative and ground glass opacities in all lobes, increased oxygen requirement, - pulmology and ID consulted. - Sputum sample recollected: Normal OP flora. RSV panel negative,  - ID recommended to stop abx due to clinical course not consistent with typical bacterial infection, vanc/cefepime d/ced on 4/29. Azithromycin discontinued 5/1. ID signed off 5/2  - As per cardiology  follow-up 5/2, doubt amiodarone toxicity since only started in December 2015. Cardiology have discussed with pulmonology and have stopped amiodarone for now. Continue steroids. - Barium swallow: Some nonspecific esophageal dysmotility. - Torsemide resumed by cardiology on 5/3. - As per pulmonology follow-up 5/4: Risk of bronchoscopy outweighs benefit and hence plans to follow daily chest x-ray, keep off of amiodarone for 6 weeks, treated with prednisone for 6 weeks and if no improvement over that course or if worsens then consider bronchoscopy - Started Heparin gtt 5/4. - As per pulmonology 5/5: Acute respiratory failure with hypoxia presumably due to organizing pneumonia from amiodarone: Recommendations are to hold amiodarone indefinitely, slow prednisone taper, wean oxygen for >88%. Coumadin resumed 5/5. Pulmonary want him off Plavix for now in case [place]/biopsy needed in the future. - Slowly improving.  Non-ischemic cardiomyopathy/acute on chronic systolic CHF - euvolemic on admission but developed some volume overload likely precipitated by acute respiratory failure.  - Continue Coreg hydralazine, imdur and statin..  - Treated with IV Lasixand oral Aldactone. D/ced due to cr elevation. - Not on ACEi/ ARB due to CKD - Advanced heart failure team follow-up appreciated and do not believe that the primary process here is heart failure. Diurese as needed - Coumadin (last dose 5/1) and Plavix (last dose 5/2) held for possible bronchoscopy.  - Torsemide resumed 5/3   Acute on chronic kidney disease stage III - Baseline creatinine around 1.8-2.  - [place] abd shows medical renal ds. Repeat ua no infection. Avoid renal toxin. Renal dosing meds. - Cr worsening, diuretic held since 4/30. - creatinine has improved from 2.18 > 1.8 >1.9 >2.02 >1.69. - Follow creatinine while diuretics have been resumed.   History of CVA and peripheral vascular disease/new CVA versus TIA on 5/7 - PTA on Coumadin, Plavix  and statin.  Patient on both Coumadin and Plavix for A. fib (had TIA on Coumadin alone). - Both Coumadin and Plavix had been held for a couple of days while bronchoscopy was being contemplated. - After INR dropped to less than 2, IV heparin was initiated. Plavix continues to be held in case he needs bronchoscopy in the future. - On 5/7, patient had fallen in the toilet and noted intermittently to have speech difficulty, left facial droop and left upper extremity weakness-waxing and waning. - Head CT 5/7 negative for bleed. - Unable to do MRI brain secondary to patient having pacemaker. - Stroke code was initiated by rapid response and neurology consultation appreciated. Discussed with Dr. Thad Ranger. - At this time on appropriate treatment i.e. IV heparin while waiting for Coumadin to become therapeutic. - Not candidate for TPA.  Atrial fibrillation - Biventricular paced rhythm - Amiodarone discontinued 5/2 - Anticoagulation held and resumed  GERD Continue PPI  Malnutrition,  - low albumin, nutrition consult obtained.  BPH/urinary retention  - of >600 on 5/1, in and out cathx1, increased flomax.  Chronic anemia - Stable  Leukocytosis - Possibly related to steroids  Diarrhea - Resolved. C. difficile PCR negative.  Fall on 5/7 - No overt injuries. - CT head negative  Code Status: DNR Family Communication: Me and Dr. Thad Ranger discussed extensively with patient's spouse at bedside on 08/02/14. Disposition Plan: Patient was planned for discharge today but will be postponed until stable from new events-TIA/CVA.   Consultants:  Advanced CHF team  PCCM  ID-signed off 5/2  Neurology  Procedures:  None  Antibiotics:  Azithromycin- completed   Subjective: Alerted by nursing this morning that patient had fallen in the toilet. He denied passing out. He stated that when he tried to get up from the commode, he felt dizzy but no chest pain or palpitations. Nursing noted  difficulty in speech. He apparently has chronic left facial droop and left upper extremity weakness. Symptoms eventually resolved but recurred briefly when his wife came by later in the morning with speech difficulty and worsening left upper extremity weakness-resolved again.  Objective: Filed Vitals:   08/02/14 0900 08/02/14 1007 08/02/14 1021 08/02/14 1445  BP:   120/38 119/44  Pulse:   60 79  Temp: 98.3 F (36.8 C)  98.7 F (37.1 C) 98.7 F (37.1 C)  TempSrc:   Oral Oral  Resp:   18 18  Height:      Weight:      SpO2:  95% 90% 96%    Intake/Output Summary (Last 24 hours) at 08/02/14 1511 Last data filed at 08/02/14 1458  Gross per 24 hour  Intake   1326 ml  Output   3368 ml  Net  -2042 ml   Filed Weights   07/31/14 0510 08/01/14 0528 08/02/14 0448  Weight: 67.359 kg (148 lb 8 oz) 67.405 kg (148 lb 9.6 oz) 67.722 kg (149 lb 4.8 oz)     Exam:  General exam: Pleasant elderly male lying comfortably in bed. Respiratory system: Clear to auscultation. No increased work of breathing. Cardiovascular system: S1 & S2 heard, RRR. No JVD, murmurs, gallops, clicks. Trace bilateral leg edema. Telemetry: BiV paced. Gastrointestinal system: Abdomen is nondistended, soft and nontender. Normal bowel sounds heard. Central nervous system: Examined during the episode: Patient is aphasic/dysarthric with left pronator drift and left facial droop. Except left upper extremity weakness Extremities: Symmetric 5 x 5 power.   Data Reviewed: Basic Metabolic Panel:  Recent Labs Lab 07/27/14 0539  07/29/14  0600 07/30/14 0548 07/31/14 0458 08/01/14 0333 08/02/14 0415  NA 132*  < > 132* 134* 136 140 137  K 4.1  < > 4.5 4.8 4.3 4.6 4.2  CL 92*  < > 95* 92* 94* 95* 94*  CO2 28  < > 26 30 32 34* 35*  GLUCOSE 89  < > 143* 124* 127* 106* 98  BUN 82*  < > 79* 86* 82* 78* 68*  CREATININE 2.26*  < > 1.83* 1.91* 1.91* 2.02* 1.69*  CALCIUM 9.4  < > 9.5 9.3 8.8* 9.3 8.8*  MG 2.2  --   --   --   --    --   --   < > = values in this interval not displayed. Liver Function Tests:  Recent Labs Lab 07/27/14 0539 07/28/14 0522  AST 30 26  ALT 44 38  ALKPHOS 126 123  BILITOT 1.2 1.1  PROT 6.0* 6.3*  ALBUMIN 2.1* 2.1*   No results for input(s): LIPASE, AMYLASE in the last 168 hours. No results for input(s): AMMONIA in the last 168 hours. CBC:  Recent Labs Lab 07/28/14 0522 07/30/14 0548 07/31/14 0458 08/01/14 1137 08/02/14 0415  WBC 19.6* 21.7* 18.3* 22.0* 14.1*  HGB 9.7* 10.2* 9.5* 9.7* 9.3*  HCT 27.5* 29.6* 27.7* 29.2* 27.4*  MCV 88.4 90.8 91.1 95.4 94.8  PLT 367 375 362 390 384   Cardiac Enzymes: No results for input(s): CKTOTAL, CKMB, CKMBINDEX, TROPONINI in the last 168 hours. BNP (last 3 results)  Recent Labs  02/24/14 1606 02/26/14 1623 03/06/14 1129  PROBNP 2973.0* 18948.0* 5943.0*   CBG: No results for input(s): GLUCAP in the last 168 hours.  Recent Results (from the past 240 hour(s))  Culture, expectorated sputum-assessment     Status: None   Collection Time: 07/23/14 11:23 PM  Result Value Ref Range Status   Specimen Description SPUTUM  Final   Special Requests NONE  Final   Sputum evaluation   Final    MICROSCOPIC FINDINGS SUGGEST THAT THIS SPECIMEN IS NOT REPRESENTATIVE OF LOWER RESPIRATORY SECRETIONS. PLEASE RECOLLECT. RESULT CALLED TO, READ BACK BY AND VERIFIED WITH: NOTIFIED G RUSUL,RN 07/24/14 AT 0040 BY RHOLMES   Report Status 07/24/2014 FINAL  Final  Culture, expectorated sputum-assessment     Status: None   Collection Time: 07/24/14  9:14 PM  Result Value Ref Range Status   Specimen Description SPUTUM  Final   Special Requests Normal  Final   Sputum evaluation   Final    MICROSCOPIC FINDINGS SUGGEST THAT THIS SPECIMEN IS NOT REPRESENTATIVE OF LOWER RESPIRATORY SECRETIONS. PLEASE RECOLLECT. CALLED TO D.HART ,RN AT 0124 BY L.PITT 07/25/14    Report Status 07/25/2014 FINAL  Final  Culture, expectorated sputum-assessment     Status: None    Collection Time: 07/25/14  3:42 PM  Result Value Ref Range Status   Specimen Description SPUTUM  Final   Special Requests Normal  Final   Sputum evaluation   Final    THIS SPECIMEN IS ACCEPTABLE. RESPIRATORY CULTURE REPORT TO FOLLOW.   Report Status 07/25/2014 FINAL  Final  Culture, respiratory (NON-Expectorated)     Status: None   Collection Time: 07/25/14  3:42 PM  Result Value Ref Range Status   Specimen Description SPUTUM  Final   Special Requests NONE  Final   Gram Stain   Final    RARE WBC PRESENT, PREDOMINANTLY PMN RARE SQUAMOUS EPITHELIAL CELLS PRESENT RARE GRAM POSITIVE COCCI IN PAIRS Performed at [facility]  Culture   Final    NORMAL OROPHARYNGEAL FLORA Performed at [facility]    Report Status 07/31/2014 FINAL  Final  Respiratory virus panel     Status: None   Collection Time: 07/25/14  8:03 PM  Result Value Ref Range Status   Respiratory Syncytial Virus A Negative Negative Final   Respiratory Syncytial Virus B Negative Negative Final   Influenza A Negative Negative Final   Influenza B Negative Negative Final   Parainfluenza 1 Negative Negative Final   Parainfluenza 2 Negative Negative Final   Parainfluenza 3 Negative Negative Final   Metapneumovirus Negative Negative Final   Rhinovirus Negative Negative Final   Adenovirus Negative Negative Final    Comment: (NOTE) Performed At: [facility] [address] [place], [place] [id number] Mila Homer MD WU:[id number]   Clostridium Difficile by PCR     Status: None   Collection Time: 07/30/14 12:51 PM  Result Value Ref Range Status   C difficile by pcr NEGATIVE NEGATIVE Final         Studies: Ct Head Wo Contrast  08/02/2014   CLINICAL DATA:  Right facial droop.  History of stroke.  EXAM: CT HEAD WITHOUT CONTRAST  TECHNIQUE: Contiguous axial images were obtained from the base of the skull through the vertex without intravenous contrast.  COMPARISON:  Head CT dated 03/25/2013  and brain MR dated 03/26/2013.  FINDINGS: Previously demonstrated in bold right occipital, right frontoparietal and small left posterior frontal infarcts are unchanged. Diffusely enlarged ventricles and subarachnoid spaces. No intracranial hemorrhage, mass lesion or CT evidence of acute infarction. Unremarkable bones and included paranasal sinuses.  IMPRESSION: 1. No acute abnormality. 2. Stable old infarcts, as described above. 3. Stable mild to moderate diffuse cerebral atrophy. These results were called by telephone at the time of interpretation on 08/02/2014 at 9:32 am to Dr. Thad Ranger, who verbally acknowledged these results.   Electronically Signed   By: Beckie Salts M.D.   On: 08/02/2014 09:32   Dg Chest Port 1 View  08/01/2014   CLINICAL DATA:  81 year old male with a history of pulmonary infiltrates and shortness of breath.  EXAM: PORTABLE CHEST - 1 VIEW  COMPARISON:  Multiple prior, most recent 07/31/2014, 07/30/2014  FINDINGS: Cardiomediastinal silhouette unchanged.  Atherosclerosis.  Unchanged cardiac pacing device with 3 leads in place.  Bilateral airspace disease, involving the right greater than left bases and the left greater than right apices. Distribution is similar to comparison plain film. Over the course of several chest x-ray this has improved, dating to 07/22/2014 and 07/20/2014.  Trace pleural effusions.  Calcified right hilar lymph nodes.  IMPRESSION: Persisting bilateral airspace disease, which is unchanged since the most recent plain films though has improved since the comparisons of 07/20/2014.  Persisting small pleural effusions.  Signed,  Yvone Neu. Loreta Ave, DO  [facility]  [facility]   Electronically Signed   By: Gilmer Mor D.O.   On: 08/01/2014 07:19        Scheduled Meds: . allopurinol  200 mg Oral Daily  . atorvastatin  40 mg Oral QPM  . carvedilol  6.25 mg Oral BID WC  . DULoxetine  60 mg Oral Daily  . hydrALAZINE  12.5 mg  Oral BID  . ipratropium-albuterol  3 mL Nebulization TID  . isosorbide mononitrate  30 mg Oral Daily  . multivitamin with minerals  1 tablet Oral Daily  . pantoprazole  80 mg Oral Daily  . predniSONE  40  mg Oral Q breakfast  . sodium chloride  3 mL Intravenous Q12H  . tamsulosin  0.8 mg Oral QHS  . torsemide  20 mg Oral QPM  . torsemide  40 mg Oral Q breakfast  . Warfarin - Pharmacist Dosing Inpatient   Does not apply q1800   Continuous Infusions: . heparin 950 Units/hr (08/01/14 2323)    Principal Problem:   Acute respiratory failure Active Problems:   TIA 2007, Dec 2014 (Plavix added)   Coronary artery disease-moderate Oct 2015   CKD (chronic kidney disease), stage III   Moderate to Severe Mitral Regurgitation   PAD- LLE PTA, RCEA, attempted LICA stent   Tissue AVR 2006 (in [place])   NICM (nonischemic cardiomyopathy)   PAF   Hyperlipidemia   Chronic anticoagulation   Pacemaker- MDT BiV 02/17/14   Acute on chronic systolic CHF (congestive heart failure)   Chronic systolic heart failure   Acute renal failure superimposed on stage 3 chronic kidney disease   Pneumonia, organism unspecified   Acute respiratory failure with hypoxia   Abnormal CT scan, chest   Diarrhea   Amiodarone pulmonary toxicity    Time spent: 40 minutes    Lamontae Ricardo, MD, FACP, FHM. [facility] Pager [phone]  If 7PM-7AM, please contact night-coverage [email or web address] Password [email or web address] 08/02/2014, 3:11 PM    LOS: 13 days

## 2014-08-02 NOTE — Progress Notes (Signed)
At first patient was unable to state what happened and how he fell. Before the fall patient was sitting up in his recliner chair. He states that he went to the bathroom, he used the bathroom, then he got up to go back to the bed when he became dizzy and fell. No bruising noted. No skin tears. Patient states that he did not hit his head.

## 2014-08-02 NOTE — Consult Note (Signed)
Referring Physician: Hongalgi    Chief Complaint: Difficulty with speech  HPI: Timothy Durham is an 81 y.o. male with a history of a stroke with residual left hemiparesis who went to the bathroom this morning.  Had a bowel movement and on getting up became dizzy and fell.  With evaluation afterward noted to have difficulty with speech.  Patient on Heparin and Coumadin.  Code stroke called.  Initial NIHSS of 4. Patient unable to have MRI due to pacemaker.  Date last known well: Date: 08/02/2014 Time last known well: Time: 07:54 tPA Given: No: Patient on Heparin and Coumadin, symptoms improving.  Past Medical History  Diagnosis Date  . Arthritis   . Non-obstructive CAD     a. 12/2013 NSTEMI/Cath: LM nl, LAD 20, LCX 40-50, RCA 20.  Marland Kitchen Hypertension   . Stroke     a. July 2000;  b. January 2007;  c. TIA in 2014.  . CKD (chronic kidney disease), stage III   . GERD (gastroesophageal reflux disease)   . Foot pain, bilateral   . H/O hematuria   . Hyperlipidemia   . Vitamin D deficiency   . Chronic combined systolic and diastolic CHF (congestive heart failure)     a. 12/2013 Echo: EF 30-35%, mod conc LVH, Gr 3 DD, mild AI, mod-sev MR, mod dil LA, mild TR.  Marland Kitchen PAF (paroxysmal atrial fibrillation)     a. CHA2DS2VASc = 7--> Chronic Coumadin.  Marland Kitchen NICM (nonischemic cardiomyopathy)     a. 12/2013 Echo: EF 30-35%.  . Aortic valve prosthesis present     a. 2006: S/P bioprosthetic AVR.  Marland Kitchen PAD (peripheral artery disease)     a. s/p LLE stenting.  . Carotid arterial disease     a. s/p R CEA  . History of tobacco abuse   . Moderate to Severe Mitral Regurgitation     a. 12/2013 Echo: Mod-Sev MR.  Marland Kitchen Anxiety   . COPD (chronic obstructive pulmonary disease)   . Coronary arteriosclerosis Oct 2015    med Rx  . Shortness of breath dyspnea   . Depression   . Presence of permanent cardiac pacemaker   . Heart murmur   . Dysrhythmia     Past Surgical History  Procedure Laterality Date  . Aortic valve  replacement (avr)/coronary artery bypass grafting (cabg)  12/2008  . Eye surgery    . Carotid angiogram  1996  . Left lower extremity stent    . Carotid endarterectomy    . Insert / replace / remove pacemaker    . Coronary angiogram  Oct 2015  . Pacemaker insertion  Nov 2015    MDT BiV  . Left and right heart catheterization with coronary angiogram N/A 01/14/2014    Procedure: LEFT AND RIGHT HEART CATHETERIZATION WITH CORONARY ANGIOGRAM;  Surgeon: Peter M Swaziland, MD;  Location: [facility] CATH LAB;  Service: Cardiovascular;  Laterality: N/A;  . Bi-ventricular pacemaker insertion N/A 02/17/2014    Procedure: BI-VENTRICULAR PACEMAKER INSERTION (CRT-P);  Surgeon: Marinus Maw, MD;  Location: [facility] CATH LAB;  Service: Cardiovascular;  Laterality: N/A;  . Coronary artery bypass graft    . Coronary angioplasty    . Tonsillectomy      Family History  Problem Relation Age of Onset  . Stroke Mother   . Cancer Father    Social History:  reports that he quit smoking about 15 years ago. His smoking use included Cigarettes. He has a 70 pack-year smoking history. He has never used smokeless tobacco.  He reports that he drinks about 0.6 oz of alcohol per week. He reports that he does not use illicit drugs.  Allergies:  Allergies  Allergen Reactions  . Ambien [Zolpidem] Other (See Comments)    Hallucinations  . Ativan [Lorazepam] Other (See Comments)    hallucinations  . Oxycontin [Oxycodone] Other (See Comments)    hallucinations  . Penicillins     Unknown childhood reaction   . Sulfa Antibiotics     unknown    Medications:  I have reviewed the patient's current medications. Prior to Admission:  Prescriptions prior to admission  Medication Sig Dispense Refill Last Dose  . acetaminophen (TYLENOL) 500 MG tablet Take 1,000 mg by mouth at bedtime.    07/19/2014 at Unknown time  . allopurinol (ZYLOPRIM) 100 MG tablet Take 200 mg by mouth daily.   07/19/2014 at Unknown time  . amiodarone (PACERONE) 200  MG tablet Take 1 tablet (200 mg total) by mouth daily. 90 tablet 3 07/19/2014 at Unknown time  . atorvastatin (LIPITOR) 40 MG tablet Take 40 mg by mouth every evening.   07/19/2014 at Unknown time  . Calcium Carb-Cholecalciferol (CALCIUM 600 + D PO) Take 2 tablets by mouth every morning.   07/19/2014 at Unknown time  . carvedilol (COREG) 6.25 MG tablet Take 1 tablet (6.25 mg total) by mouth 2 (two) times daily. 180 tablet 1 07/19/2014 at 1700  . clopidogrel (PLAVIX) 75 MG tablet Take 75 mg by mouth daily.   07/19/2014 at Unknown time  . DULoxetine (CYMBALTA) 60 MG capsule Take 60 mg by mouth daily.    07/19/2014 at Unknown time  . hydrALAZINE (APRESOLINE) 25 MG tablet Take 0.5 tablets (12.5 mg total) by mouth 2 (two) times daily. 90 tablet 6 07/19/2014 at Unknown time  . isosorbide mononitrate (IMDUR) 30 MG 24 hr tablet Take 1 tablet (30 mg total) by mouth daily. 30 tablet 6 07/19/2014 at Unknown time  . metolazone (ZAROXOLYN) 5 MG tablet Take 1 tablet (5 mg total) by mouth as needed. FOR WEIGHT 163 LB OR GREATER (Patient taking differently: Take 5 mg by mouth as needed (fluid). ) 30 tablet 3 Past Month at Unknown time  . Omega-3 Fatty Acids (FISH OIL) 1200 MG CAPS Take 1 capsule by mouth every morning.   07/19/2014 at Unknown time  . omeprazole (PRILOSEC) 40 MG capsule Take 40 mg by mouth every morning.   07/19/2014 at Unknown time  . potassium chloride SA (K-DUR,KLOR-CON) 20 MEQ tablet Take 1 tablet (20 mEq total) by mouth as needed. TAKE ONLY WHEN YOU TAKE METOLAZONE 30 tablet 3 Past Month at Unknown time  . spironolactone (ALDACTONE) 25 MG tablet Take 1 tablet (25 mg total) by mouth daily. 90 tablet 3 07/19/2014 at Unknown time  . tamsulosin (FLOMAX) 0.4 MG CAPS capsule Take 1 capsule (0.4 mg total) by mouth at bedtime. 90 capsule 3 07/19/2014 at Unknown time  . torsemide (DEMADEX) 20 MG tablet Take 1 tablet (20 mg total) by mouth 2 (two) times daily. Take 40 mg in am and 20 mg in pm 90 tablet 3 07/19/2014 at  Unknown time  . warfarin (COUMADIN) 4 MG tablet Take 2 mg by mouth daily at 6 PM.    07/19/2014 at Unknown time   Scheduled: . allopurinol  200 mg Oral Daily  . atorvastatin  40 mg Oral QPM  . carvedilol  6.25 mg Oral BID WC  . DULoxetine  60 mg Oral Daily  . hydrALAZINE  12.5 mg Oral BID  .  ipratropium-albuterol  3 mL Nebulization TID  . isosorbide mononitrate  30 mg Oral Daily  . multivitamin with minerals  1 tablet Oral Daily  . pantoprazole  80 mg Oral Daily  . predniSONE  40 mg Oral Q breakfast  . sodium chloride  3 mL Intravenous Q12H  . tamsulosin  0.8 mg Oral QHS  . torsemide  20 mg Oral QPM  . torsemide  40 mg Oral Q breakfast  . Warfarin - Pharmacist Dosing Inpatient   Does not apply q1800    ROS: History obtained from the patient  General ROS: negative for - chills, fatigue, fever, night sweats, weight gain or weight loss Psychological ROS: negative for - behavioral disorder, hallucinations, memory difficulties, mood swings or suicidal ideation Ophthalmic ROS: negative for - blurry vision, double vision, eye pain or loss of vision ENT ROS: negative for - epistaxis, nasal discharge, oral lesions, sore throat, tinnitus or vertigo Allergy and Immunology ROS: negative for - hives or itchy/watery eyes Hematological and Lymphatic ROS: negative for - bleeding problems, bruising or swollen lymph nodes Endocrine ROS: negative for - galactorrhea, hair pattern changes, polydipsia/polyuria or temperature intolerance Respiratory ROS: negative for - cough, hemoptysis, shortness of breath or wheezing Cardiovascular ROS: lower extremity swelling Gastrointestinal ROS: negative for - abdominal pain, diarrhea, hematemesis, nausea/vomiting or stool incontinence Genito-Urinary ROS: negative for - dysuria, hematuria, incontinence or urinary frequency/urgency Musculoskeletal ROS: negative for - joint swelling or muscular weakness Neurological ROS: as noted in HPI Dermatological ROS: negative  for rash and skin lesion changes  Physical Examination: Blood pressure 115/65, pulse 84, temperature 98.3 F (36.8 C), temperature source Oral, resp. rate 18, height 5\' 10"  (1.778 m), weight 67.722 kg (149 lb 4.8 oz), SpO2 95 %.  HEENT-  Normocephalic, no lesions, without obvious abnormality.  Normal external eye and conjunctiva.  Normal TM's bilaterally.  Normal auditory canals and external ears. Normal external nose, mucus membranes and septum.  Normal pharynx. Cardiovascular- S1, S2 normal, pulses palpable throughout   Lungs- chest clear, no wheezing, rales, normal symmetric air entry Abdomen- soft, non-tender; bowel sounds normal; no masses,  no organomegaly Extremities- 2+ edema in the bilateral lower extremities, right greater than left Lymph-no adenopathy palpable Musculoskeletal-no joint tenderness, deformity or swelling Skin-bruise on left side of face.    Neurological Examination Mental Status: Alert, oriented, thought content appropriate.  Speech with some mild expressive aphasia noted.  Able to follow 3 step commands without difficulty. Cranial Nerves: II: Discs flat bilaterally; Visual fields grossly normal, pupils equal, round, reactive to light and accommodation III,IV, VI: ptosis not present, extra-ocular motions intact bilaterally V,VII: Left facial droop, facial light touch sensation normal bilaterally VIII: hearing normal bilaterally IX,X: gag reflex present XI: bilateral shoulder shrug XII: midline tongue extension Motor: Right : Upper extremity   5/5    Left:     Upper extremity   4+/5  Lower extremity   5/5     Lower extremity   5-/5 Tone and bulk:normal tone throughout; no atrophy noted Sensory: Pinprick and light touch decreased on the left Deep Tendon Reflexes: 2+ and symmetric throughout Plantars: Right: mute   Left: upgoing Cerebellar: normal finger-to-nose and normal heel-to-shin testing bilaterally Gait: not tested due to safety concerns     Laboratory Studies:  Basic Metabolic Panel:  Recent Labs Lab 07/27/14 0539  07/29/14 0600 07/30/14 0548 07/31/14 0458 08/01/14 0333 08/02/14 0415  NA 132*  < > 132* 134* 136 140 137  K 4.1  < > 4.5  4.8 4.3 4.6 4.2  CL 92*  < > 95* 92* 94* 95* 94*  CO2 28  < > 26 30 32 34* 35*  GLUCOSE 89  < > 143* 124* 127* 106* 98  BUN 82*  < > 79* 86* 82* 78* 68*  CREATININE 2.26*  < > 1.83* 1.91* 1.91* 2.02* 1.69*  CALCIUM 9.4  < > 9.5 9.3 8.8* 9.3 8.8*  MG 2.2  --   --   --   --   --   --   < > = values in this interval not displayed.  Liver Function Tests:  Recent Labs Lab 07/27/14 0539 07/28/14 0522  AST 30 26  ALT 44 38  ALKPHOS 126 123  BILITOT 1.2 1.1  PROT 6.0* 6.3*  ALBUMIN 2.1* 2.1*   No results for input(s): LIPASE, AMYLASE in the last 168 hours. No results for input(s): AMMONIA in the last 168 hours.  CBC:  Recent Labs Lab 07/28/14 0522 07/30/14 0548 07/31/14 0458 08/01/14 1137 08/02/14 0415  WBC 19.6* 21.7* 18.3* 22.0* 14.1*  HGB 9.7* 10.2* 9.5* 9.7* 9.3*  HCT 27.5* 29.6* 27.7* 29.2* 27.4*  MCV 88.4 90.8 91.1 95.4 94.8  PLT 367 375 362 390 384    Cardiac Enzymes: No results for input(s): CKTOTAL, CKMB, CKMBINDEX, TROPONINI in the last 168 hours.  BNP: Invalid input(s): POCBNP  CBG: No results for input(s): GLUCAP in the last 168 hours.  Microbiology: Results for orders placed or performed during the hospital encounter of 07/19/14  Culture, blood (routine x 2)     Status: None   Collection Time: 07/20/14  2:10 AM  Result Value Ref Range Status   Specimen Description BLOOD RIGHT FOREARM  Final   Special Requests BOTTLES DRAWN AEROBIC AND ANAEROBIC 5CC EA  Final   Culture   Final    NO GROWTH 5 DAYS Performed at [facility]    Report Status 07/26/2014 FINAL  Final  Culture, blood (routine x 2)     Status: None   Collection Time: 07/20/14  2:19 AM  Result Value Ref Range Status   Specimen Description BLOOD LEFT FOREARM  Final    Special Requests BOTTLES DRAWN AEROBIC ONLY 4CC  Final   Culture   Final    NO GROWTH 5 DAYS Performed at [facility]    Report Status 07/26/2014 FINAL  Final  MRSA PCR Screening     Status: None   Collection Time: 07/20/14  4:45 AM  Result Value Ref Range Status   MRSA by PCR NEGATIVE NEGATIVE Final    Comment:        The GeneXpert MRSA Assay (FDA approved for NASAL specimens only), is one component of a comprehensive MRSA colonization surveillance program. It is not intended to diagnose MRSA infection nor to guide or monitor treatment for MRSA infections.   Culture, expectorated sputum-assessment     Status: None   Collection Time: 07/23/14 11:23 PM  Result Value Ref Range Status   Specimen Description SPUTUM  Final   Special Requests NONE  Final   Sputum evaluation   Final    MICROSCOPIC FINDINGS SUGGEST THAT THIS SPECIMEN IS NOT REPRESENTATIVE OF LOWER RESPIRATORY SECRETIONS. PLEASE RECOLLECT. RESULT CALLED TO, READ BACK BY AND VERIFIED WITH: NOTIFIED G RUSUL,RN 07/24/14 AT 0040 BY RHOLMES   Report Status 07/24/2014 FINAL  Final  Culture, expectorated sputum-assessment     Status: None   Collection Time: 07/24/14  9:14 PM  Result Value Ref Range  Status   Specimen Description SPUTUM  Final   Special Requests Normal  Final   Sputum evaluation   Final    MICROSCOPIC FINDINGS SUGGEST THAT THIS SPECIMEN IS NOT REPRESENTATIVE OF LOWER RESPIRATORY SECRETIONS. PLEASE RECOLLECT. CALLED TO D.HART ,RN AT 0124 BY L.PITT 07/25/14    Report Status 07/25/2014 FINAL  Final  Culture, expectorated sputum-assessment     Status: None   Collection Time: 07/25/14  3:42 PM  Result Value Ref Range Status   Specimen Description SPUTUM  Final   Special Requests Normal  Final   Sputum evaluation   Final    THIS SPECIMEN IS ACCEPTABLE. RESPIRATORY CULTURE REPORT TO FOLLOW.   Report Status 07/25/2014 FINAL  Final  Culture, respiratory (NON-Expectorated)     Status: None   Collection  Time: 07/25/14  3:42 PM  Result Value Ref Range Status   Specimen Description SPUTUM  Final   Special Requests NONE  Final   Gram Stain   Final    RARE WBC PRESENT, PREDOMINANTLY PMN RARE SQUAMOUS EPITHELIAL CELLS PRESENT RARE GRAM POSITIVE COCCI IN PAIRS Performed at [facility]    Culture   Final    NORMAL OROPHARYNGEAL FLORA Performed at [facility]    Report Status 07/31/2014 FINAL  Final  Respiratory virus panel     Status: None   Collection Time: 07/25/14  8:03 PM  Result Value Ref Range Status   Respiratory Syncytial Virus A Negative Negative Final   Respiratory Syncytial Virus B Negative Negative Final   Influenza A Negative Negative Final   Influenza B Negative Negative Final   Parainfluenza 1 Negative Negative Final   Parainfluenza 2 Negative Negative Final   Parainfluenza 3 Negative Negative Final   Metapneumovirus Negative Negative Final   Rhinovirus Negative Negative Final   Adenovirus Negative Negative Final    Comment: (NOTE) Performed At: [facility] [address] [place], [place] [id number] Mila Homer MD WU:[id number]   Clostridium Difficile by PCR     Status: None   Collection Time: 07/30/14 12:51 PM  Result Value Ref Range Status   C difficile by pcr NEGATIVE NEGATIVE Final    Coagulation Studies:  Recent Labs  07/31/14 0458 08/01/14 0333 08/02/14 0415  LABPROT 21.8* 19.4* 18.5*  INR 1.89* 1.62* 1.52*    Urinalysis: No results for input(s): COLORURINE, LABSPEC, PHURINE, GLUCOSEU, HGBUR, BILIRUBINUR, KETONESUR, PROTEINUR, UROBILINOGEN, NITRITE, LEUKOCYTESUR in the last 168 hours.  Invalid input(s): APPERANCEUR  Lipid Panel:    Component Value Date/Time   CHOL 146 03/26/2013 0820   TRIG 91 03/26/2013 0820   HDL 47 03/26/2013 0820   CHOLHDL 3.1 03/26/2013 0820   VLDL 18 03/26/2013 0820   LDLCALC 81 03/26/2013 0820    HgbA1C:  Lab Results  Component Value Date   HGBA1C 5.7* 03/26/2013    Urine  Drug Screen:     Component Value Date/Time   LABOPIA NONE DETECTED 03/26/2013 1408   COCAINSCRNUR NONE DETECTED 03/26/2013 1408   LABBENZ NONE DETECTED 03/26/2013 1408   AMPHETMU NONE DETECTED 03/26/2013 1408   THCU NONE DETECTED 03/26/2013 1408   LABBARB NONE DETECTED 03/26/2013 1408    Alcohol Level: No results for input(s): ETH in the last 168 hours.  Other results:  Imaging: Dg Chest 2 View  07/31/2014   CLINICAL DATA:  Acute respiratory failure with hypoxia. Pt hx COPD, CAD  EXAM: CHEST  2 VIEW  COMPARISON:  07/30/2014  FINDINGS: Patchy areas of airspace and interstitial opacities in  the lungs are stable from most recent prior exam. No new lung abnormalities. No significant pleural effusion and no pneumothorax.  Changes from cardiac surgery and aortic valve replacement are stable. Cardiac silhouette is mildly enlarged. No mediastinal or hilar masses. Left anterior chest wall sequential pacemaker is stable and well positioned.  IMPRESSION: No change from the previous day's study. Bilateral patchy airspace and interstitial lung opacities are stable. No new abnormalities.   Electronically Signed   By: Amie Portland M.D.   On: 07/31/2014 13:02   Ct Head Wo Contrast  08/02/2014   CLINICAL DATA:  Right facial droop.  History of stroke.  EXAM: CT HEAD WITHOUT CONTRAST  TECHNIQUE: Contiguous axial images were obtained from the base of the skull through the vertex without intravenous contrast.  COMPARISON:  Head CT dated 03/25/2013 and brain MR dated 03/26/2013.  FINDINGS: Previously demonstrated in bold right occipital, right frontoparietal and small left posterior frontal infarcts are unchanged. Diffusely enlarged ventricles and subarachnoid spaces. No intracranial hemorrhage, mass lesion or CT evidence of acute infarction. Unremarkable bones and included paranasal sinuses.  IMPRESSION: 1. No acute abnormality. 2. Stable old infarcts, as described above. 3. Stable mild to moderate diffuse cerebral  atrophy. These results were called by telephone at the time of interpretation on 08/02/2014 at 9:32 am to Dr. Thad Ranger, who verbally acknowledged these results.   Electronically Signed   By: Beckie Salts M.D.   On: 08/02/2014 09:32   Dg Chest Port 1 View  08/01/2014   CLINICAL DATA:  81 year old male with a history of pulmonary infiltrates and shortness of breath.  EXAM: PORTABLE CHEST - 1 VIEW  COMPARISON:  Multiple prior, most recent 07/31/2014, 07/30/2014  FINDINGS: Cardiomediastinal silhouette unchanged.  Atherosclerosis.  Unchanged cardiac pacing device with 3 leads in place.  Bilateral airspace disease, involving the right greater than left bases and the left greater than right apices. Distribution is similar to comparison plain film. Over the course of several chest x-ray this has improved, dating to 07/22/2014 and 07/20/2014.  Trace pleural effusions.  Calcified right hilar lymph nodes.  IMPRESSION: Persisting bilateral airspace disease, which is unchanged since the most recent plain films though has improved since the comparisons of 07/20/2014.  Persisting small pleural effusions.  Signed,  Yvone Neu. Loreta Ave, DO  [facility]  [facility]   Electronically Signed   By: Gilmer Mor D.O.   On: 08/01/2014 07:19    Assessment: 81 y.o. male on Coumadin and Heparin with an episode of expressive aphasia this morning.  Symptoms are resolving.  Patient not a tPA candidate due to PT of 18.5.  Head CT personally reviewed and shows no evidence of acute changes-no evidence of hemorrhage.  Patient unable to have an MRI due to pacemaker.  NIHSS low with renal insufficiency therefore patient not an intervention candidate.    Stroke Risk Factors - atrial fibrillation, hyperlipidemia and hypertension  Plan: 1. Prophylactic therapy-Continue anticoagulation 2. NPO until RN stroke swallow screen 3. Frequent neuro checks  Case discussed with Dr. Carmon Ginsberg, MD [facility] [phone] 08/02/2014, 10:26 AM  Addendum: Called back to see patient.  Speech worsened (unable to say anything and worsening of left sided weakness-unable to lift left arm).  Upon my arrival symptoms had resolved with only some mild word finding difficulties noted.  This raises concern for bi-hemispheric compromise.    Recommendations: 1.  Echocardiogram 2.  Patient still not an intervention candidate at this time.  Will continue to follow clinically.  If further events that do not clear, will consider at that time whether risk of further renal compromise is acceptable per family.   3.  Continue frequent neuro checks.    Thana Farr, MD [facility] [phone]

## 2014-08-02 NOTE — Progress Notes (Signed)
Patient has been having episodes of left sided weakness and not being able to speak off and on all day.  Dr. Thad Ranger is aware.

## 2014-08-02 NOTE — Progress Notes (Signed)
  Echocardiogram 2D Echocardiogram has been performed.  Timothy Durham M 08/02/2014, 2:48 PM

## 2014-08-02 NOTE — Progress Notes (Signed)
ANTICOAGULATION CONSULT NOTE - Follow Up Consult  Pharmacy Consult for Heparin and Coumadin Indication: atrial fibrillation and prior CVA  Allergies  Allergen Reactions  . Ambien [Zolpidem] Other (See Comments)    Hallucinations  . Ativan [Lorazepam] Other (See Comments)    hallucinations  . Oxycontin [Oxycodone] Other (See Comments)    hallucinations  . Penicillins     Unknown childhood reaction   . Sulfa Antibiotics     unknown    Patient Measurements: Height: 5\' 10"  (177.8 cm) Weight: 149 lb 4.8 oz (67.722 kg) (scale b) IBW/kg (Calculated) : 73 Heparin Dosing Weight: 68 kg  Vital Signs: Temp: 98.7 F (37.1 C) (05/07 1445) Temp Source: Oral (05/07 1445) BP: 119/44 mmHg (05/07 1445) Pulse Rate: 79 (05/07 1445)  Labs:  Recent Labs  07/31/14 0458 08/01/14 0333 08/01/14 1137 08/02/14 0415  HGB 9.5*  --  9.7* 9.3*  HCT 27.7*  --  29.2* 27.4*  PLT 362  --  390 384  LABPROT 21.8* 19.4*  --  18.5*  INR 1.89* 1.62*  --  1.52*  HEPARINUNFRC 0.35 0.50  --  0.61  CREATININE 1.91* 2.02*  --  1.69*    Estimated Creatinine Clearance: 32.8 mL/min (by C-G formula based on Cr of 1.69).  Assessment:   81 yr old male on Coumadin prior to admission for PAF.    Home Coumadin regimen: 2 mg daily. INR 2.99 on admit 4/24.   Coumadin and Plavix held on 5/2 for possible bronchoscopy.   Heparin begun on 5/4 when INR down to 1.96.   Heparin level remains therapeutic today (0.61) on 950 units/hr.   Risk of bronch outweighed benefit so bronch not done.  Coumadin resumed on 5/6 with 1 mg dose; 2 mg given on 5/6, but INR has trended down to 1.52.  Plavix remains on hold.    Became dizzy and fell, when up to bathroom this am.  Did not hit his head. Some difficulty with speech noted. Code stroke called; evaluated by Neuro. Head CT negative for bleed.    Goal of Therapy:  INR 2-3 Heparin level 0.3-0.7 units/ml Monitor platelets by anticoagulation protocol: Yes   Plan:   Continue  heparin drip at 950 units/hr.  Increase today's Coumadin dose to 3 mg x 1.  Continue daily heparin level, PT/INR and CBC.  Dennie Fetters, [place] Pager: [phone] 08/02/2014,3:41 PM

## 2014-08-02 NOTE — Progress Notes (Signed)
Orthostatic vitals not completed. Patient unstable on his legs

## 2014-08-02 NOTE — Progress Notes (Signed)
Patient assisted to the bedside commode and had a bowel movement. Was assisted by nurse and nurse tech. Assisted patient back to bed. Stroke screen within baseline as documented. Patient alert and oriented and able to answer questions efficiently and perform movements as directed. Currently patient complains of no pain, dyspnea, or discomfort. Will continue to monitor patient to end of shift.

## 2014-08-02 NOTE — Progress Notes (Signed)
Orthostatic vitals not completed due to patients condition.

## 2014-08-02 NOTE — Progress Notes (Signed)
Patient ID: Timothy Durham, male   DOB: [birth year], 81 y.o.   MRN: [id number]   Timothy Durham is an 81 yo male with a history of prior CVA in 2000 secondary to PAF, nonischemic cardimoyopathy s/p [employer] CRT-P (02/17/14), chronic systolic HF (EF 30-35%), non-obstructive CAD, HTN, CKD stage IV, HLD, PAD and bioprosthetic AVR.   Admitted 4/24 ago with worsening SOB with fevers and chills with WBC 12.2 and left shift. Weight stable. Initial CXR with mild vascular congestion. Felt to have acute bronchitis. However dyspnea got worse and CXR 4/26 showed worsening bilateral infiltrates ? HF vs multifocal PNA. IV Lasix and abx initially, has completed antibiotic course.  Steroids increased.   Earlier this morning the patient developed dysarthria and left-sided weakness. A code stroke was called. Dr. Thad Ranger has evaluated and CT shows no bleed. Some of his symptoms are now improving. He denies dyspnea, chest pain or syncope.   Scheduled Meds: . allopurinol  200 mg Oral Daily  . atorvastatin  40 mg Oral QPM  . carvedilol  6.25 mg Oral BID WC  . DULoxetine  60 mg Oral Daily  . hydrALAZINE  12.5 mg Oral BID  . ipratropium-albuterol  3 mL Nebulization TID  . isosorbide mononitrate  30 mg Oral Daily  . multivitamin with minerals  1 tablet Oral Daily  . pantoprazole  80 mg Oral Daily  . predniSONE  40 mg Oral Q breakfast  . sodium chloride  3 mL Intravenous Q12H  . tamsulosin  0.8 mg Oral QHS  . torsemide  20 mg Oral QPM  . torsemide  40 mg Oral Q breakfast  . Warfarin - Pharmacist Dosing Inpatient   Does not apply q1800   Continuous Infusions: . heparin 950 Units/hr (08/01/14 2323)   PRN Meds:.acetaminophen, albuterol   Filed Vitals:   08/02/14 0842 08/02/14 0900 08/02/14 1007 08/02/14 1021  BP: 115/65   120/38  Pulse: 84   60  Temp:  98.3 F (36.8 C)  98.7 F (37.1 C)  TempSrc:    Oral  Resp:    18  Height:      Weight:      SpO2: 95%  95% 90%    Intake/Output Summary (Last 24 hours) at  08/02/14 1145 Last data filed at 08/02/14 0649  Gross per 24 hour  Intake   1546 ml  Output   1968 ml  Net   -422 ml    LABS: Basic Metabolic Panel:  Recent Labs  40/98/11 0333 08/02/14 0415  NA 140 137  K 4.6 4.2  CL 95* 94*  CO2 34* 35*  GLUCOSE 106* 98  BUN 78* 68*  CREATININE 2.02* 1.69*  CALCIUM 9.3 8.8*   CBC:  Recent Labs  08/01/14 1137 08/02/14 0415  WBC 22.0* 14.1*  HGB 9.7* 9.3*  HCT 29.2* 27.4*  MCV 95.4 94.8  PLT 390 384   RADIOLOGY: Dg Chest 2 View  07/31/2014   CLINICAL DATA:  Acute respiratory failure with hypoxia. Pt hx COPD, CAD  EXAM: CHEST  2 VIEW  COMPARISON:  07/30/2014  FINDINGS: Patchy areas of airspace and interstitial opacities in the lungs are stable from most recent prior exam. No new lung abnormalities. No significant pleural effusion and no pneumothorax.  Changes from cardiac surgery and aortic valve replacement are stable. Cardiac silhouette is mildly enlarged. No mediastinal or hilar masses. Left anterior chest wall sequential pacemaker is stable and well positioned.  IMPRESSION: No change from the previous day's study. Bilateral patchy airspace  and interstitial lung opacities are stable. No new abnormalities.   Electronically Signed   By: Amie Portland M.D.   On: 07/31/2014 13:02   Dg Chest 2 View  07/30/2014   CLINICAL DATA:  Hypoxia.  Weakness.  EXAM: CHEST  2 VIEW  COMPARISON:  07/28/2014.  FINDINGS: There is improvement from 07/28/2014, with partial clearance of the multifocal bilateral airspace opacities although significant opacities persist, left greater than right. Mild right lateral base pleural thickening persists. No significant effusion is evident. There is moderate unchanged cardiomegaly. There is prior sternotomy and aortic valvuloplasty. There are intact appearances of the transvenous leads.  IMPRESSION: Slight improvement. Multifocal bilateral airspace opacities persist, partially cleared.   Electronically Signed   By: Ellery Plunk M.D.   On: 07/30/2014 06:50   Dg Chest 2 View  07/28/2014   CLINICAL DATA:  Respiratory failure .  EXAM: CHEST  2 VIEW  COMPARISON:  07/22/2014 .  CT 07/25/2014.  FINDINGS: Mediastinum and hilar structures are normal. Prior cardiac valve replacement. Cardiac pacer noted with lead tips in stable position. Persistent multifocal bilateral pulmonary infiltrates, no interim change. Small right pleural effusion. No pneumothorax.  IMPRESSION: 1. Persistent multifocal bilateral pulmonary infiltrates most consistent with pneumonia. No significant interim change.  2. Prior cardiac valve replacement. Cardiac pacer stable position. Stable cardiomegaly .   Electronically Signed   By: Maisie Fus  Register   On: 07/28/2014 08:33   Dg Chest 2 View  07/20/2014   CLINICAL DATA:  Shortness of breath.  Pacemaker.  EXAM: CHEST  2 VIEW  COMPARISON:  05/04/2014.  FINDINGS: Mildly increased cardiac silhouette. Triple lead pacer unchanged from priors. Moderate vascular congestion without overt failure or significant consolidation. Mild to moderate pleural effusions, greater on the RIGHT. Aortic valvular placement with prior median sternotomy. No acute osseous findings.  IMPRESSION: Cardiomegaly with stable appearance of the transvenous pacer.  Moderate vascular congestion without overt failure. Overall improved from prior radiograph in February.   Electronically Signed   By: Davonna Belling M.D.   On: 07/20/2014 02:23   Ct Head Wo Contrast  08/02/2014   CLINICAL DATA:  Right facial droop.  History of stroke.  EXAM: CT HEAD WITHOUT CONTRAST  TECHNIQUE: Contiguous axial images were obtained from the base of the skull through the vertex without intravenous contrast.  COMPARISON:  Head CT dated 03/25/2013 and brain MR dated 03/26/2013.  FINDINGS: Previously demonstrated in bold right occipital, right frontoparietal and small left posterior frontal infarcts are unchanged. Diffusely enlarged ventricles and subarachnoid spaces. No  intracranial hemorrhage, mass lesion or CT evidence of acute infarction. Unremarkable bones and included paranasal sinuses.  IMPRESSION: 1. No acute abnormality. 2. Stable old infarcts, as described above. 3. Stable mild to moderate diffuse cerebral atrophy. These results were called by telephone at the time of interpretation on 08/02/2014 at 9:32 am to Dr. Thad Ranger, who verbally acknowledged these results.   Electronically Signed   By: Beckie Salts M.D.   On: 08/02/2014 09:32   Ct Chest Wo Contrast  07/25/2014   CLINICAL DATA:  Hypoxia and progressive shortness of breath for the past 3 days. Nonproductive cough and wheeze  EXAM: CT CHEST WITHOUT CONTRAST  TECHNIQUE: Multidetector CT imaging of the chest was performed following the standard protocol without IV contrast.  COMPARISON:  None.  FINDINGS: THORACIC INLET/BODY WALL:  There is a biventricular pacer from the left with leads in unremarkable position. No acute findings at the thoracic inlet.  There is an incidental 1  cm intramuscular lipoma in the right chest wall.  MEDIASTINUM:  Cardiomegaly which is chronic based on chest x-rays. No pericardial effusion. The patient is status post median sternotomy with aortic valve replacement. The ascending aorta measures 41 mm in diameter. There is mild enlargement of mediastinal lymph nodes which could be reactive to the presumed pneumonia or sequela of remote granulomatous disease (there is also scattered nodal coarse calcification). No acute vascular findings. Negative esophagus.  LUNG WINDOWS:  There is patchy consolidative and ground-glass opacities in all lobes. Airspace disease is most extensive in the left upper lobe and most dense in the posterior segment right upper lobe. Small bilateral pleural effusions with interlobular septal thickening. No cavitation. No evidence of loculated effusion.  UPPER ABDOMEN:  Granulomatous changes in the liver and spleen.  OSSEOUS:  No acute findings.  IMPRESSION: 1. Multilobar  pneumonia. 2. Superimposed interstitial edema and small pleural effusions. 3. Chronic and postoperative findings noted above.   Electronically Signed   By: Marnee Spring M.D.   On: 07/25/2014 08:00   Dg Esophagus  07/29/2014   CLINICAL DATA:  Dysphagia.  Choking.  Bilateral pneumonia.  EXAM: ESOPHOGRAM/BARIUM SWALLOW  TECHNIQUE: Combined double contrast and single contrast examination performed using effervescent crystals, thick barium liquid, and thin barium liquid.  FLUOROSCOPY TIME:  Radiation Exposure Index (as provided by the fluoroscopic device): Dose area product: 829.79uGy*m^2  COMPARISON:  Radiographs 07/28/2014.  Chest CT 07/25/2014.  FINDINGS: Pharyngeal phase of swallowing was normal. The study was carried out in the AP projection due to patient condition. Nonspecific esophageal dysmotility was present without stricture. 0/3 primary waves reach the gastroesophageal junction. Tertiary contractions were observed. Fetal on esophagus incidentally noted.  There is no bear ear passage of the 13 mm barium tablet challenge however the tablet remain static in the lower esophagus just proximal to the gastroesophageal junction for several min. No gastroesophageal reflux could be elicited with water siphon test.  Esophageal stasis was present in the supine position. Esophageal folds appeared within normal limits.  IMPRESSION: 1. No esophageal stricture or mass. 2. Nonspecific esophageal dysmotility disorder with with tertiary contractions. Stasis in the supine position.   Electronically Signed   By: Andreas Newport M.D.   On: 07/29/2014 09:39   US Renal  07/20/2014   CLINICAL DATA:  Acute kidney injury.  Hypertension.  EXAM: RENAL/URINARY TRACT ULTRASOUND COMPLETE  COMPARISON:  None.  FINDINGS: Right Kidney:  Length: 10.3 cm. Echogenicity within normal limits. No mass or hydronephrosis visualized. Diffuse cortical thinning with prominent renal sinus fat.  Left Kidney:  Length: 11.1 cm. Echogenicity within  normal limits. No mass or hydronephrosis visualized. Diffuse cortical thinning with prominent renal sinus fat.  Bladder:  Multiple diverticula. The largest measures 4.9 cm in maximum diameter. Small amount of internal debris with no definite calculi  IMPRESSION: 1. Diffuse bilateral renal cortical atrophy. 2. Multiple bladder diverticula. 3. Small amount of debris in the bladder with no definite calculi.   Electronically Signed   By: Beckie Salts M.D.   On: 07/20/2014 12:27   Dg Chest Port 1 View  08/01/2014   CLINICAL DATA:  81 year old male with a history of pulmonary infiltrates and shortness of breath.  EXAM: PORTABLE CHEST - 1 VIEW  COMPARISON:  Multiple prior, most recent 07/31/2014, 07/30/2014  FINDINGS: Cardiomediastinal silhouette unchanged.  Atherosclerosis.  Unchanged cardiac pacing device with 3 leads in place.  Bilateral airspace disease, involving the right greater than left bases and the left greater than right apices.  Distribution is similar to comparison plain film. Over the course of several chest x-ray this has improved, dating to 07/22/2014 and 07/20/2014.  Trace pleural effusions.  Calcified right hilar lymph nodes.  IMPRESSION: Persisting bilateral airspace disease, which is unchanged since the most recent plain films though has improved since the comparisons of 07/20/2014.  Persisting small pleural effusions.  Signed,  Yvone Neu. Loreta Ave, DO  [facility]  [facility]   Electronically Signed   By: Gilmer Mor D.O.   On: 08/01/2014 07:19   Dg Chest Port 1 View  07/22/2014   CLINICAL DATA:  Acute onset of hypoxemia.  Initial encounter.  EXAM: PORTABLE CHEST - 1 VIEW  COMPARISON:  Chest radiograph performed 07/20/2014  FINDINGS: The lungs are well-aerated. Worsening bilateral midlung airspace opacification raises concern for multifocal pneumonia. Small bilateral pleural effusions are seen, and underlying interstitial edema cannot be excluded.  Vascular congestion is noted. No pneumothorax is seen.  The cardiomediastinal silhouette is mildly enlarged. The patient is status post median sternotomy. A pacemaker is noted overlying the left chest wall, with leads ending overlying the right atrium and right ventricle. No acute osseous abnormalities are seen.  IMPRESSION: 1. Worsening bilateral mid lung airspace opacification raises concern for multifocal pneumonia. 2. Small bilateral pleural effusions, with vascular congestion and mild cardiomegaly. Underlying interstitial edema cannot be excluded.   Electronically Signed   By: Roanna Raider M.D.   On: 07/22/2014 04:30    PHYSICAL EXAM General: NAD. HEENT: normal Neck: Supple Lungs: minimal basilar crackles CV: RRR, 2/6 systolic murmur apex.  Abdomen: Soft, nontender, no distention.  Extremities: 1 + ankle edema  TELEMETRY: Reviewed telemetry pt in NSR with BiV pacing    Assessment:   1. Acute respiratory failure: Suspect CHF + possible PNA;  2. Acute on chronic systolic heart failure EF 30-35% 3. Acute bronchitis 4. CKD, stage III-IV 5. PAF in NSR on amio - CHDSVASC 5. On warfarin 6. Multifocal airspace disease: ?amiodarone toxicity versus hypersensitivity pneumonitis vs aspiration PNA.   7. TIA  Plan/Discussion:   Volume status is improving. Continue present dose of Demadex and follow renal function. Continue current dose of carvedilol, hydralazine, and imdur. No ACEI with CKD.   Workup of airspace disease per pulmonary.  He is now off amiodarone for ?lung toxicity.  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  No bronchoscopy yet.  Warfarin restarted, will keep him off Plavix for now in case bronch/biopsy needed in the near future.    Patient with CVA/TIA this morning. His neurological status is improving. CT shows no bleed. Neurology is following. Plan echocardiogram to rule out source of  embolus. He does have a history of paroxysmal atrial fibrillation but is in sinus rhythm this morning. Continue IV heparin until INR greater than 2.      Timothy Durham  08/02/2014 11:45 AM

## 2014-08-02 NOTE — Progress Notes (Signed)
Noticed some changes in patient during 10:20 assessment. Paged.Dr. Thad Ranger and notified her that patient was not able to lift left arm as much. Grip strength is very weal in left hand. Unable to speak at this moment, and unable to say his wifes name.

## 2014-08-02 NOTE — Progress Notes (Signed)
Called and spoke with Shela Nevin which is the patients wife.  Described what happened to patient regarding the fall. Wife states that this is similar to what happened when patient had TIA. Wife will be here soon.

## 2014-08-02 NOTE — Progress Notes (Signed)
Noted in chart pt with new symptoms of possible CVA.  Will hold on progressive ambulation and follow up and see when appropriate. Alanson Aly, BSN

## 2014-08-03 ENCOUNTER — Inpatient Hospital Stay (HOSPITAL_COMMUNITY): Payer: Medicare HMO

## 2014-08-03 DIAGNOSIS — I739 Peripheral vascular disease, unspecified: Secondary | ICD-10-CM

## 2014-08-03 DIAGNOSIS — E785 Hyperlipidemia, unspecified: Secondary | ICD-10-CM

## 2014-08-03 DIAGNOSIS — Z950 Presence of cardiac pacemaker: Secondary | ICD-10-CM

## 2014-08-03 DIAGNOSIS — I6529 Occlusion and stenosis of unspecified carotid artery: Secondary | ICD-10-CM | POA: Insufficient documentation

## 2014-08-03 DIAGNOSIS — I639 Cerebral infarction, unspecified: Secondary | ICD-10-CM

## 2014-08-03 DIAGNOSIS — Z9889 Other specified postprocedural states: Secondary | ICD-10-CM

## 2014-08-03 DIAGNOSIS — R4701 Aphasia: Secondary | ICD-10-CM | POA: Insufficient documentation

## 2014-08-03 DIAGNOSIS — I429 Cardiomyopathy, unspecified: Secondary | ICD-10-CM

## 2014-08-03 LAB — CBC
HCT: 28.3 % — ABNORMAL LOW (ref 39.0–52.0)
Hemoglobin: 9.5 g/dL — ABNORMAL LOW (ref 13.0–17.0)
MCH: 31.4 pg (ref 26.0–34.0)
MCHC: 33.6 g/dL (ref 30.0–36.0)
MCV: 93.4 fL (ref 78.0–100.0)
Platelets: 412 10*3/uL — ABNORMAL HIGH (ref 150–400)
RBC: 3.03 MIL/uL — AB (ref 4.22–5.81)
RDW: 17.8 % — ABNORMAL HIGH (ref 11.5–15.5)
WBC: 11.8 10*3/uL — ABNORMAL HIGH (ref 4.0–10.5)

## 2014-08-03 LAB — BASIC METABOLIC PANEL
Anion gap: 8 (ref 5–15)
BUN: 61 mg/dL — ABNORMAL HIGH (ref 6–20)
CO2: 35 mmol/L — ABNORMAL HIGH (ref 22–32)
Calcium: 8.9 mg/dL (ref 8.9–10.3)
Chloride: 93 mmol/L — ABNORMAL LOW (ref 101–111)
Creatinine, Ser: 1.84 mg/dL — ABNORMAL HIGH (ref 0.61–1.24)
GFR calc Af Amer: 38 mL/min — ABNORMAL LOW (ref >60)
GFR calc non Af Amer: 33 mL/min — ABNORMAL LOW (ref >60)
GLUCOSE: 133 mg/dL — AB (ref 70–99)
Potassium: 4.3 mmol/L (ref 3.5–5.1)
Sodium: 136 mmol/L (ref 135–145)

## 2014-08-03 LAB — LIPID PANEL
CHOL/HDL RATIO: 2.7 ratio
Cholesterol: 147 mg/dL (ref 0–200)
HDL: 54 mg/dL (ref >40)
LDL Cholesterol: 80 mg/dL (ref 0–99)
Triglycerides: 63 mg/dL (ref <150)
VLDL: 13 mg/dL (ref 0–40)

## 2014-08-03 LAB — PROTIME-INR
INR: 1.36 (ref 0.00–1.49)
Prothrombin Time: 16.9 seconds — ABNORMAL HIGH (ref 11.6–15.2)

## 2014-08-03 LAB — HEPARIN LEVEL (UNFRACTIONATED): HEPARIN UNFRACTIONATED: 0.61 [IU]/mL (ref 0.30–0.70)

## 2014-08-03 MED ORDER — WARFARIN SODIUM 3 MG PO TABS
3.0000 mg | ORAL_TABLET | Freq: Once | ORAL | Status: AC
  Administered 2014-08-03: 3 mg via ORAL
  Filled 2014-08-03: qty 1

## 2014-08-03 MED ORDER — CLOPIDOGREL BISULFATE 75 MG PO TABS
75.0000 mg | ORAL_TABLET | Freq: Every day | ORAL | Status: DC
  Administered 2014-08-03 – 2014-08-07 (×5): 75 mg via ORAL
  Filled 2014-08-03 (×5): qty 1

## 2014-08-03 NOTE — Progress Notes (Signed)
Patient ID: Timothy Durham, male   DOB: [birth year], 81 y.o.   MRN: [id number]   Timothy Durham is an 81 yo male with a history of prior CVA in 2000 secondary to PAF, nonischemic cardimoyopathy s/p [employer] CRT-P (02/17/14), chronic systolic HF (EF 30-35%), non-obstructive CAD, HTN, CKD stage IV, HLD, PAD and bioprosthetic AVR.   Admitted 4/24 ago with worsening SOB with fevers and chills with WBC 12.2 and left shift. Weight stable. Initial CXR with mild vascular congestion. Felt to have acute bronchitis. However dyspnea got worse and CXR 4/26 showed worsening bilateral infiltrates ? HF vs multifocal PNA. IV Lasix and abx initially, has completed antibiotic course.  Steroids increased.   Patient denies CP or dyspnea; no further neurological symptoms   Scheduled Meds: . allopurinol  200 mg Oral Daily  . atorvastatin  40 mg Oral QPM  . carvedilol  6.25 mg Oral BID WC  . DULoxetine  60 mg Oral Daily  . hydrALAZINE  12.5 mg Oral BID  . isosorbide mononitrate  30 mg Oral Daily  . multivitamin with minerals  1 tablet Oral Daily  . pantoprazole  80 mg Oral Daily  . predniSONE  40 mg Oral Q breakfast  . sodium chloride  3 mL Intravenous Q12H  . tamsulosin  0.8 mg Oral QHS  . torsemide  20 mg Oral QPM  . torsemide  40 mg Oral Q breakfast  . Warfarin - Pharmacist Dosing Inpatient   Does not apply q1800   Continuous Infusions: . heparin 950 Units/hr (08/03/14 0236)   PRN Meds:.acetaminophen, albuterol   Filed Vitals:   08/03/14 0010 08/03/14 0300 08/03/14 0943 08/03/14 0948  BP: 122/37 117/32 134/41   Pulse: 71 74  71  Temp: 98 F (36.7 C) 98.1 F (36.7 C)    TempSrc: Oral Oral    Resp: Height:      Weight:  148 lb 11.2 oz (67.45 kg)    SpO2: 94% 94%  96%    Intake/Output Summary (Last 24 hours) at 08/03/14 1008 Last data filed at 08/03/14 0900  Gross per 24 hour  Intake 1544.99 ml  Output   3745 ml  Net -2200.01 ml    LABS: Basic Metabolic Panel:  Recent Labs  40/98/11 0415 08/03/14 0318  NA 137 136  K 4.2 4.3  CL 94* 93*  CO2 35* 35*  GLUCOSE 98 133*  BUN 68* 61*  CREATININE 1.69* 1.84*  CALCIUM 8.8* 8.9   CBC:  Recent Labs  08/02/14 0415 08/03/14 0318  WBC 14.1* 11.8*  HGB 9.3* 9.5*  HCT 27.4* 28.3*  MCV 94.8 93.4  PLT 384 412*   RADIOLOGY: Dg Chest 2 View  07/31/2014   CLINICAL DATA:  Acute respiratory failure with hypoxia. Pt hx COPD, CAD  EXAM: CHEST  2 VIEW  COMPARISON:  07/30/2014  FINDINGS: Patchy areas of airspace and interstitial opacities in the lungs are stable from most recent prior exam. No new lung abnormalities. No significant pleural effusion and no pneumothorax.  Changes from cardiac surgery and aortic valve replacement are stable. Cardiac silhouette is mildly enlarged. No mediastinal or hilar masses. Left anterior chest wall sequential pacemaker is stable and well positioned.  IMPRESSION: No change from the previous day's study. Bilateral patchy airspace and interstitial lung opacities are stable. No new abnormalities.   Electronically Signed   By: Amie Portland M.D.   On: 07/31/2014 13:02   Dg Chest 2 View  07/30/2014   CLINICAL DATA:  Hypoxia.  Weakness.  EXAM: CHEST  2 VIEW  COMPARISON:  07/28/2014.  FINDINGS: There is improvement from 07/28/2014, with partial clearance of the multifocal bilateral airspace opacities although significant opacities persist, left greater than right. Mild right lateral base pleural thickening persists. No significant effusion is evident. There is moderate unchanged cardiomegaly. There is prior sternotomy and aortic valvuloplasty. There are intact appearances of the transvenous leads.  IMPRESSION: Slight improvement. Multifocal bilateral airspace opacities persist, partially cleared.   Electronically Signed   By: Ellery Plunk M.D.   On: 07/30/2014 06:50   Dg Chest 2 View  07/28/2014   CLINICAL DATA:  Respiratory failure .  EXAM: CHEST  2 VIEW  COMPARISON:  07/22/2014 .  CT 07/25/2014.   FINDINGS: Mediastinum and hilar structures are normal. Prior cardiac valve replacement. Cardiac pacer noted with lead tips in stable position. Persistent multifocal bilateral pulmonary infiltrates, no interim change. Small right pleural effusion. No pneumothorax.  IMPRESSION: 1. Persistent multifocal bilateral pulmonary infiltrates most consistent with pneumonia. No significant interim change.  2. Prior cardiac valve replacement. Cardiac pacer stable position. Stable cardiomegaly .   Electronically Signed   By: Maisie Fus  Register   On: 07/28/2014 08:33   Dg Chest 2 View  07/20/2014   CLINICAL DATA:  Shortness of breath.  Pacemaker.  EXAM: CHEST  2 VIEW  COMPARISON:  05/04/2014.  FINDINGS: Mildly increased cardiac silhouette. Triple lead pacer unchanged from priors. Moderate vascular congestion without overt failure or significant consolidation. Mild to moderate pleural effusions, greater on the RIGHT. Aortic valvular placement with prior median sternotomy. No acute osseous findings.  IMPRESSION: Cardiomegaly with stable appearance of the transvenous pacer.  Moderate vascular congestion without overt failure. Overall improved from prior radiograph in February.   Electronically Signed   By: Davonna Belling M.D.   On: 07/20/2014 02:23   Ct Head Wo Contrast  08/02/2014   CLINICAL DATA:  Right facial droop.  History of stroke.  EXAM: CT HEAD WITHOUT CONTRAST  TECHNIQUE: Contiguous axial images were obtained from the base of the skull through the vertex without intravenous contrast.  COMPARISON:  Head CT dated 03/25/2013 and brain MR dated 03/26/2013.  FINDINGS: Previously demonstrated in bold right occipital, right frontoparietal and small left posterior frontal infarcts are unchanged. Diffusely enlarged ventricles and subarachnoid spaces. No intracranial hemorrhage, mass lesion or CT evidence of acute infarction. Unremarkable bones and included paranasal sinuses.  IMPRESSION: 1. No acute abnormality. 2. Stable old  infarcts, as described above. 3. Stable mild to moderate diffuse cerebral atrophy. These results were called by telephone at the time of interpretation on 08/02/2014 at 9:32 am to Dr. Thad Ranger, who verbally acknowledged these results.   Electronically Signed   By: Beckie Salts M.D.   On: 08/02/2014 09:32   Ct Chest Wo Contrast  07/25/2014   CLINICAL DATA:  Hypoxia and progressive shortness of breath for the past 3 days. Nonproductive cough and wheeze  EXAM: CT CHEST WITHOUT CONTRAST  TECHNIQUE: Multidetector CT imaging of the chest was performed following the standard protocol without IV contrast.  COMPARISON:  None.  FINDINGS: THORACIC INLET/BODY WALL:  There is a biventricular pacer from the left with leads in unremarkable position. No acute findings at the thoracic inlet.  There is an incidental 1 cm intramuscular lipoma in the right chest wall.  MEDIASTINUM:  Cardiomegaly which is chronic based on chest x-rays. No pericardial effusion. The patient is status post median sternotomy with aortic valve replacement. The ascending aorta measures 41  mm in diameter. There is mild enlargement of mediastinal lymph nodes which could be reactive to the presumed pneumonia or sequela of remote granulomatous disease (there is also scattered nodal coarse calcification). No acute vascular findings. Negative esophagus.  LUNG WINDOWS:  There is patchy consolidative and ground-glass opacities in all lobes. Airspace disease is most extensive in the left upper lobe and most dense in the posterior segment right upper lobe. Small bilateral pleural effusions with interlobular septal thickening. No cavitation. No evidence of loculated effusion.  UPPER ABDOMEN:  Granulomatous changes in the liver and spleen.  OSSEOUS:  No acute findings.  IMPRESSION: 1. Multilobar pneumonia. 2. Superimposed interstitial edema and small pleural effusions. 3. Chronic and postoperative findings noted above.   Electronically Signed   By: Marnee Spring M.D.    On: 07/25/2014 08:00   Dg Esophagus  07/29/2014   CLINICAL DATA:  Dysphagia.  Choking.  Bilateral pneumonia.  EXAM: ESOPHOGRAM/BARIUM SWALLOW  TECHNIQUE: Combined double contrast and single contrast examination performed using effervescent crystals, thick barium liquid, and thin barium liquid.  FLUOROSCOPY TIME:  Radiation Exposure Index (as provided by the fluoroscopic device): Dose area product: 829.79uGy*m^2  COMPARISON:  Radiographs 07/28/2014.  Chest CT 07/25/2014.  FINDINGS: Pharyngeal phase of swallowing was normal. The study was carried out in the AP projection due to patient condition. Nonspecific esophageal dysmotility was present without stricture. 0/3 primary waves reach the gastroesophageal junction. Tertiary contractions were observed. Fetal on esophagus incidentally noted.  There is no bear ear passage of the 13 mm barium tablet challenge however the tablet remain static in the lower esophagus just proximal to the gastroesophageal junction for several min. No gastroesophageal reflux could be elicited with water siphon test.  Esophageal stasis was present in the supine position. Esophageal folds appeared within normal limits.  IMPRESSION: 1. No esophageal stricture or mass. 2. Nonspecific esophageal dysmotility disorder with with tertiary contractions. Stasis in the supine position.   Electronically Signed   By: Andreas Newport M.D.   On: 07/29/2014 09:39   US Renal  07/20/2014   CLINICAL DATA:  Acute kidney injury.  Hypertension.  EXAM: RENAL/URINARY TRACT ULTRASOUND COMPLETE  COMPARISON:  None.  FINDINGS: Right Kidney:  Length: 10.3 cm. Echogenicity within normal limits. No mass or hydronephrosis visualized. Diffuse cortical thinning with prominent renal sinus fat.  Left Kidney:  Length: 11.1 cm. Echogenicity within normal limits. No mass or hydronephrosis visualized. Diffuse cortical thinning with prominent renal sinus fat.  Bladder:  Multiple diverticula. The largest measures 4.9 cm in  maximum diameter. Small amount of internal debris with no definite calculi  IMPRESSION: 1. Diffuse bilateral renal cortical atrophy. 2. Multiple bladder diverticula. 3. Small amount of debris in the bladder with no definite calculi.   Electronically Signed   By: Beckie Salts M.D.   On: 07/20/2014 12:27   Dg Chest Port 1 View  08/01/2014   CLINICAL DATA:  81 year old male with a history of pulmonary infiltrates and shortness of breath.  EXAM: PORTABLE CHEST - 1 VIEW  COMPARISON:  Multiple prior, most recent 07/31/2014, 07/30/2014  FINDINGS: Cardiomediastinal silhouette unchanged.  Atherosclerosis.  Unchanged cardiac pacing device with 3 leads in place.  Bilateral airspace disease, involving the right greater than left bases and the left greater than right apices. Distribution is similar to comparison plain film. Over the course of several chest x-ray this has improved, dating to 07/22/2014 and 07/20/2014.  Trace pleural effusions.  Calcified right hilar lymph nodes.  IMPRESSION: Persisting bilateral airspace disease,  which is unchanged since the most recent plain films though has improved since the comparisons of 07/20/2014.  Persisting small pleural effusions.  Signed,  Yvone Neu. Loreta Ave, DO  [facility]  [facility]   Electronically Signed   By: Gilmer Mor D.O.   On: 08/01/2014 07:19   Dg Chest Port 1 View  07/22/2014   CLINICAL DATA:  Acute onset of hypoxemia.  Initial encounter.  EXAM: PORTABLE CHEST - 1 VIEW  COMPARISON:  Chest radiograph performed 07/20/2014  FINDINGS: The lungs are well-aerated. Worsening bilateral midlung airspace opacification raises concern for multifocal pneumonia. Small bilateral pleural effusions are seen, and underlying interstitial edema cannot be excluded. Vascular congestion is noted. No pneumothorax is seen.  The cardiomediastinal silhouette is mildly enlarged. The patient is status post median sternotomy. A pacemaker is noted  overlying the left chest wall, with leads ending overlying the right atrium and right ventricle. No acute osseous abnormalities are seen.  IMPRESSION: 1. Worsening bilateral mid lung airspace opacification raises concern for multifocal pneumonia. 2. Small bilateral pleural effusions, with vascular congestion and mild cardiomegaly. Underlying interstitial edema cannot be excluded.   Electronically Signed   By: Roanna Raider M.D.   On: 07/22/2014 04:30    PHYSICAL EXAM General: NAD. HEENT: normal Neck: Supple Lungs: minimal basilar crackles CV: RRR, 2/6 systolic murmur apex.  Abdomen: Soft, nontender, no distention.  Extremities: 1 + ankle edema  TELEMETRY: Reviewed telemetry pt in NSR with BiV pacing    Assessment:   1. Acute respiratory failure: Suspect CHF + possible PNA;  2. Acute on chronic systolic heart failure EF 30-35% 3. Acute bronchitis 4. CKD, stage III-IV 5. PAF in NSR on amio - CHDSVASC 5. On warfarin 6. Multifocal airspace disease: ?amiodarone toxicity versus hypersensitivity pneumonitis vs aspiration PNA.   7. TIA  Plan/Discussion:   Patient appears to be euvolemic. Continue present dose of Demadex and follow renal function. Continue current dose of carvedilol, hydralazine, and imdur. No ACEI with CKD.   Workup of airspace disease per pulmonary.  He is now off amiodarone for ? lung toxicity.  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  No bronchoscopy yet.    Patient with CVA/TIA yesterday. No further symptoms today. CT showed no bleed. Neurology is following. Await echocardiogram. He does have a history of paroxysmal atrial fibrillation but is in sinus rhythm this morning. He apparently had previous TIA while on coumadin and plavix was added. Given recent TIA, will resume plavix now (had been on hold for possible bronchoscopy). Continue IV heparin until INR greater than 2.        Olga Millers  08/03/2014 10:08 AM

## 2014-08-03 NOTE — Progress Notes (Signed)
ANTICOAGULATION CONSULT NOTE - Follow Up Consult  Pharmacy Consult for Heparin and Coumadin Indication: atrial fibrillation and CVA  Patient Measurements: Height: 5\' 10"  (177.8 cm) Weight: 148 lb 11.2 oz (67.45 kg) (Scale B) IBW/kg (Calculated) : 73 Heparin Dosing Weight: 67 kg  Vital Signs: Temp: 98.1 F (36.7 C) (05/08 0300) Temp Source: Oral (05/08 0300) BP: 134/41 mmHg (05/08 0943) Pulse Rate: 71 (05/08 0948)  Labs:  Recent Labs  08/01/14 0333  08/01/14 1137 08/02/14 0415 08/03/14 0318  HGB  --   < > 9.7* 9.3* 9.5*  HCT  --   --  29.2* 27.4* 28.3*  PLT  --   --  390 384 412*  LABPROT 19.4*  --   --  18.5* 16.9*  INR 1.62*  --   --  1.52* 1.36  HEPARINUNFRC 0.50  --   --  0.61 0.61  CREATININE 2.02*  --   --  1.69* 1.84*  < > = values in this interval not displayed.  Estimated Creatinine Clearance: 30.1 mL/min (by C-G formula based on Cr of 1.84).  Assessment:   81 yr old male on Coumadin prior to admission for PAF.    Home Coumadin regimen: 2 mg daily. INR 2.99 on admit 4/24.   Coumadin and Plavix held on 5/2 for possible bronchoscopy.   Heparin begun on 5/4 when INR down to 1.96.   Heparin level remains therapeutic today (0.61) on 950 units/hr.   Risk of bronch outweighed benefit so bronch not done.  Coumadin resumed on 5/6 with 1 mg dose; 2 mg given on 5/6, then 3 mg on 5/7, but INR has trended down further to 1.36. Plavix resumed.     Became dizzy and fell, when up to bathroom on 5/7.  Did not hit his head. Some difficulty with speech noted. Code stroke called; evaluated by Neuro. Head CT 5/7 negative for bleed.  Symptoms noted resolved.  Goal of Therapy:  INR 2-3 Heparin level 0.3-0.7 units/ml Monitor platelets by anticoagulation protocol: Yes   Plan:   Continue heparin drip at 950 units/hr.  Repeat Coumadin dose 3 mg today.   Continue daily heparin level, PT/INR and CBC.  Dennie Fetters, RPh Pager: [phone] 08/03/2014,1:35 PM

## 2014-08-03 NOTE — Progress Notes (Signed)
PROGRESS NOTE    Timothy Durham TKK:[id number] DOB: [birth year] DOA: 07/19/2014 PCP: Dennis Bast, MD  HPI/Brief narrative 81 year old male with history of nonischemic cardiomyopathy with EF of 30-35% as per last echo, NSTEMI in 2015 with cardiac cath showing nonobstructive CAD, chronic kidney disease stage III, history of stroke in 2000, history of bioprosthetic aVR, paroxysmal A. fib on chronic Coumadin, tobacco use with? COPD who presented with progressive shortness of breath for the past 3 days associated with nonproductive cough and wheezing. He reports being compliant with his medications. Chest x-ray on admission showing moderate vascular congestion. Labs showed elevated BNP of 1400. Patient admitted for acute hypoxic respiratory failure due to acute bronchitis. During the hospital stay he became increasingly short of breath and wheezy with follow-up chest x-ray showing multifocal pneumonia and volume overload. Pulmonary, ID and cardiology were consulted. At this time exact etiology of his hypoxia and CT chest with diffuse bilateral consolidated airspace disease, is unclear. Consideration for bronchoscopy.   Assessment/Plan:  Acute hypoxic respiratory failure - Initially admitted for acute bronchitis but during the hospital course became increasingly short of breath - broadened antibiotic coverage with empiric vancomycin and cefepime for HCAP,O2 supplement/ duonebs/ oral prednisone.  - passed swallow eval on 4/27 with regular diet and thin liquid. - 4/29 reported worsening of sob, chest CT showed persistent patchy consolidative and ground glass opacities in all lobes, increased oxygen requirement, - pulmology and ID consulted. - Sputum sample recollected: Normal OP flora. RSV panel negative,  - ID recommended to stop abx due to clinical course not consistent with typical bacterial infection, vanc/cefepime d/ced on 4/29. Azithromycin discontinued 5/1. ID signed off 5/2  - As per cardiology  follow-up 5/2, doubt amiodarone toxicity since only started in December 2015. Cardiology have discussed with pulmonology and have stopped amiodarone for now. Continue steroids. - Barium swallow: Some nonspecific esophageal dysmotility. - Torsemide resumed by cardiology on 5/3. - Started Heparin gtt 5/4. - As per pulmonology 5/5: Acute respiratory failure with hypoxia presumably due to organizing pneumonia from amiodarone: Recommendations are to hold amiodarone indefinitely, slow prednisone taper, wean oxygen for >88%. Coumadin resumed 5/5. Was off Plavix in case [place]/biopsy needed in the future. But Plavix resumed on 5/8 secondary to new CVA/TIA on 5/7. - Slowly improving.  Non-ischemic cardiomyopathy/acute on chronic systolic CHF - euvolemic on admission but developed some volume overload likely precipitated by acute respiratory failure.  - Continue Coreg hydralazine, imdur and statin..  - Treated with IV Lasixand oral Aldactone. D/ced due to cr elevation. - Not on ACEi/ ARB due to CKD - Advanced heart failure team follow-up appreciated and do not believe that the primary process here is heart failure. Diurese as needed - Torsemide resumed 5/3   Acute on chronic kidney disease stage III - Baseline creatinine around 1.8-2.  - [place] abd shows medical renal ds. Repeat ua no infection. Avoid renal toxin. Renal dosing meds. - Cr worsening, diuretic held since 4/30. - creatinine fluctuating and gradually creeping up - Follow creatinine while diuretics have been resumed.   History of CVA and peripheral vascular disease/new CVA versus TIA on 5/7 - PTA on Coumadin, Plavix and statin. Patient on both Coumadin and Plavix for A. fib (had TIA on Coumadin alone). - Both Coumadin and Plavix had been held for a couple of days while bronchoscopy was being contemplated. - After INR dropped to less than 2, IV heparin was initiated.  - On 5/7, patient had fallen in the toilet and noted intermittently to  have  speech difficulty, left facial droop and left upper extremity weakness-waxing and waning. - Head CT 5/7 negative for bleed. - Unable to do MRI brain secondary to patient having pacemaker. - Stroke code was initiated by rapid response and neurology consultation appreciated.  - Not candidate for TPA. - Likely had CVA or TIA secondary to PAF versus ICA stenosis. Remains on IV heparin until Coumadin can become therapeutic. Plavix which was held for possible bronchoscopy has been resumed on 5/8. - Stroke team follow-up appreciated.  Atrial fibrillation - Biventricular paced rhythm - Amiodarone discontinued 5/2 - Anticoagulation held and resumed  GERD Continue PPI  Malnutrition,  - low albumin, nutrition consult obtained.  BPH/urinary retention  - of >600 on 5/1, in and out cathx1, increased flomax.  Chronic anemia - Stable  Leukocytosis - Possibly related to steroids  Diarrhea - Resolved. C. difficile PCR negative.  Fall on 5/7 - No overt injuries. - CT head negative  Code Status: DNR Family Communication: Discussed with with patient's spouse at bedside on 08/03/14. Disposition Plan: Patient was planned for discharge today but will be postponed until stable from new events-TIA/CVA. Possible discharge after INR therapeutic >2.   Consultants:  Advanced CHF team  PCCM  ID-signed off 5/2  Neurology  Procedures:  None  Antibiotics:  Azithromycin- completed   Subjective: Patient states that he feels fine. Denies complaints. As per spouse, speech has normalized. Facial droop and left upper extremity weakness resolved.  Objective: Filed Vitals:   08/03/14 0010 08/03/14 0300 08/03/14 0943 08/03/14 0948  BP: 122/37 117/32 134/41   Pulse: 71 74  71  Temp: 98 F (36.7 C) 98.1 F (36.7 C)    TempSrc: Oral Oral    Resp: Height:      Weight:  67.45 kg (148 lb 11.2 oz)    SpO2: 94% 94%  96%    Intake/Output Summary (Last 24 hours) at 08/03/14 1644 Last  data filed at 08/03/14 1325  Gross per 24 hour  Intake 1544.99 ml  Output   3045 ml  Net -1500.01 ml   Filed Weights   08/01/14 0528 08/02/14 0448 08/03/14 0300  Weight: 67.405 kg (148 lb 9.6 oz) 67.722 kg (149 lb 4.8 oz) 67.45 kg (148 lb 11.2 oz)     Exam:  General exam: Pleasant elderly male lying comfortably in bed. Looks much better than he did yesterday. Respiratory system: Clear to auscultation. No increased work of breathing. Cardiovascular system: S1 & S2 heard, RRR. No JVD, murmurs, gallops, clicks. Trace bilateral leg edema. Telemetry: BiV paced. Gastrointestinal system: Abdomen is nondistended, soft and nontender. Normal bowel sounds heard. Central nervous system: Alert and oriented. No focal deficits. Extremities: Symmetric 5 x 5 power.   Data Reviewed: Basic Metabolic Panel:  Recent Labs Lab 07/30/14 0548 07/31/14 0458 08/01/14 0333 08/02/14 0415 08/03/14 0318  NA 134* 136 140 137 136  K 4.8 4.3 4.6 4.2 4.3  CL 92* 94* 95* 94* 93*  CO2 30 32 34* 35* 35*  GLUCOSE 124* 127* 106* 98 133*  BUN 86* 82* 78* 68* 61*  CREATININE 1.91* 1.91* 2.02* 1.69* 1.84*  CALCIUM 9.3 8.8* 9.3 8.8* 8.9   Liver Function Tests:  Recent Labs Lab 07/28/14 0522  AST 26  ALT 38  ALKPHOS 123  BILITOT 1.1  PROT 6.3*  ALBUMIN 2.1*   No results for input(s): LIPASE, AMYLASE in the last 168 hours. No results for input(s): AMMONIA in the last 168 hours.  CBC:  Recent Labs Lab 07/30/14 0548 07/31/14 0458 08/01/14 1137 08/02/14 0415 08/03/14 0318  WBC 21.7* 18.3* 22.0* 14.1* 11.8*  HGB 10.2* 9.5* 9.7* 9.3* 9.5*  HCT 29.6* 27.7* 29.2* 27.4* 28.3*  MCV 90.8 91.1 95.4 94.8 93.4  PLT 375 362 390 384 412*   Cardiac Enzymes: No results for input(s): CKTOTAL, CKMB, CKMBINDEX, TROPONINI in the last 168 hours. BNP (last 3 results)  Recent Labs  02/24/14 1606 02/26/14 1623 03/06/14 1129  PROBNP 2973.0* 18948.0* 5943.0*   CBG: No results for input(s): GLUCAP in the last  168 hours.  Recent Results (from the past 240 hour(s))  Culture, expectorated sputum-assessment     Status: None   Collection Time: 07/24/14  9:14 PM  Result Value Ref Range Status   Specimen Description SPUTUM  Final   Special Requests Normal  Final   Sputum evaluation   Final    MICROSCOPIC FINDINGS SUGGEST THAT THIS SPECIMEN IS NOT REPRESENTATIVE OF LOWER RESPIRATORY SECRETIONS. PLEASE RECOLLECT. CALLED TO D.HART ,RN AT 0124 BY L.PITT 07/25/14    Report Status 07/25/2014 FINAL  Final  Culture, expectorated sputum-assessment     Status: None   Collection Time: 07/25/14  3:42 PM  Result Value Ref Range Status   Specimen Description SPUTUM  Final   Special Requests Normal  Final   Sputum evaluation   Final    THIS SPECIMEN IS ACCEPTABLE. RESPIRATORY CULTURE REPORT TO FOLLOW.   Report Status 07/25/2014 FINAL  Final  Culture, respiratory (NON-Expectorated)     Status: None   Collection Time: 07/25/14  3:42 PM  Result Value Ref Range Status   Specimen Description SPUTUM  Final   Special Requests NONE  Final   Gram Stain   Final    RARE WBC PRESENT, PREDOMINANTLY PMN RARE SQUAMOUS EPITHELIAL CELLS PRESENT RARE GRAM POSITIVE COCCI IN PAIRS Performed at [facility]    Culture   Final    NORMAL OROPHARYNGEAL FLORA Performed at [facility]    Report Status 07/31/2014 FINAL  Final  Respiratory virus panel     Status: None   Collection Time: 07/25/14  8:03 PM  Result Value Ref Range Status   Respiratory Syncytial Virus A Negative Negative Final   Respiratory Syncytial Virus B Negative Negative Final   Influenza A Negative Negative Final   Influenza B Negative Negative Final   Parainfluenza 1 Negative Negative Final   Parainfluenza 2 Negative Negative Final   Parainfluenza 3 Negative Negative Final   Metapneumovirus Negative Negative Final   Rhinovirus Negative Negative Final   Adenovirus Negative Negative Final    Comment: (NOTE) Performed At: [facility] [address] [place], [place] [id number] Mila Homer MD WU:[id number]   Clostridium Difficile by PCR     Status: None   Collection Time: 07/30/14 12:51 PM  Result Value Ref Range Status   C difficile by pcr NEGATIVE NEGATIVE Final         Studies: Ct Head Wo Contrast  08/02/2014   CLINICAL DATA:  Right facial droop.  History of stroke.  EXAM: CT HEAD WITHOUT CONTRAST  TECHNIQUE: Contiguous axial images were obtained from the base of the skull through the vertex without intravenous contrast.  COMPARISON:  Head CT dated 03/25/2013 and brain MR dated 03/26/2013.  FINDINGS: Previously demonstrated in bold right occipital, right frontoparietal and small left posterior frontal infarcts are unchanged. Diffusely enlarged ventricles and subarachnoid spaces. No intracranial hemorrhage, mass lesion or CT evidence of acute infarction.  Unremarkable bones and included paranasal sinuses.  IMPRESSION: 1. No acute abnormality. 2. Stable old infarcts, as described above. 3. Stable mild to moderate diffuse cerebral atrophy. These results were called by telephone at the time of interpretation on 08/02/2014 at 9:32 am to Dr. Thad Ranger, who verbally acknowledged these results.   Electronically Signed   By: Beckie Salts M.D.   On: 08/02/2014 09:32        Scheduled Meds: . allopurinol  200 mg Oral Daily  . atorvastatin  40 mg Oral QPM  . carvedilol  6.25 mg Oral BID WC  . clopidogrel  75 mg Oral Daily  . DULoxetine  60 mg Oral Daily  . hydrALAZINE  12.5 mg Oral BID  . isosorbide mononitrate  30 mg Oral Daily  . multivitamin with minerals  1 tablet Oral Daily  . pantoprazole  80 mg Oral Daily  . predniSONE  40 mg Oral Q breakfast  . sodium chloride  3 mL Intravenous Q12H  . tamsulosin  0.8 mg Oral QHS  . torsemide  20 mg Oral QPM  . torsemide  40 mg Oral Q breakfast  . warfarin  3 mg Oral ONCE-1800  . Warfarin - Pharmacist Dosing Inpatient   Does not apply q1800   Continuous  Infusions: . heparin 950 Units/hr (08/03/14 0236)    Principal Problem:   Acute respiratory failure Active Problems:   TIA 2007, Dec 2014 (Plavix added)   Coronary artery disease-moderate Oct 2015   CKD (chronic kidney disease), stage III   Moderate to Severe Mitral Regurgitation   PAD- LLE PTA, RCEA, attempted LICA stent   Tissue AVR 2006 (in [place])   NICM (nonischemic cardiomyopathy)   PAF   Hyperlipidemia   Chronic anticoagulation   Pacemaker- MDT BiV 02/17/14   Acute on chronic systolic CHF (congestive heart failure)   Chronic systolic heart failure   Acute renal failure superimposed on stage 3 chronic kidney disease   Pneumonia, organism unspecified   Acute respiratory failure with hypoxia   Abnormal CT scan, chest   Diarrhea   Amiodarone pulmonary toxicity   Aphasia   Carotid stenosis   S/P carotid endarterectomy    Time spent: 20 minutes    Timothy Blissett, MD, FACP, FHM. [facility] Pager [phone]  If 7PM-7AM, please contact night-coverage [email or web address] Password [email or web address] 08/03/2014, 4:44 PM    LOS: 14 days

## 2014-08-03 NOTE — Progress Notes (Signed)
STROKE TEAM PROGRESS NOTE   SUBJECTIVE (INTERVAL HISTORY) His wife is at the bedside.  Overall he feels his condition is completely resolved. He is left-handed. He had episode yesterday morning of difficulty getting words out, left facial and left arm weakness for about 3 minutes and resolved, followed by right side hand weakness and numbness for about 3 minutes and resolved, wax and wane episode about 3-4 times. CT showed old bilateral infarct but no acute abnormality. He is Coumadin with heparin bridging. He was on Coumadin and the Plavix prior to admission. His Plavix was added today. INR still not therapeutic.   OBJECTIVE Temp:  [97.9 F (36.6 C)-98.7 F (37.1 C)] 98.1 F (36.7 C) (05/08 0300) Pulse Rate:  [71-79] 71 (05/08 0948) Cardiac Rhythm:  [-] Ventricular paced;A-V Sequential paced (05/07 1905) Resp:  [18-20] 18 (05/08 0948) BP: (109-134)/(32-45) 134/41 mmHg (05/08 0943) SpO2:  [94 %-98 %] 96 % (05/08 0948) Weight:  [148 lb 11.2 oz (67.45 kg)] 148 lb 11.2 oz (67.45 kg) (05/08 0300)  No results for input(s): GLUCAP in the last 168 hours.  Recent Labs Lab 07/30/14 0548 07/31/14 0458 08/01/14 0333 08/02/14 0415 08/03/14 0318  NA 134* 136 140 137 136  K 4.8 4.3 4.6 4.2 4.3  CL 92* 94* 95* 94* 93*  CO2 30 32 34* 35* 35*  GLUCOSE 124* 127* 106* 98 133*  BUN 86* 82* 78* 68* 61*  CREATININE 1.91* 1.91* 2.02* 1.69* 1.84*  CALCIUM 9.3 8.8* 9.3 8.8* 8.9    Recent Labs Lab 07/28/14 0522  AST 26  ALT 38  ALKPHOS 123  BILITOT 1.1  PROT 6.3*  ALBUMIN 2.1*    Recent Labs Lab 07/30/14 0548 07/31/14 0458 08/01/14 1137 08/02/14 0415 08/03/14 0318  WBC 21.7* 18.3* 22.0* 14.1* 11.8*  HGB 10.2* 9.5* 9.7* 9.3* 9.5*  HCT 29.6* 27.7* 29.2* 27.4* 28.3*  MCV 90.8 91.1 95.4 94.8 93.4  PLT 375 362 390 384 412*   No results for input(s): CKTOTAL, CKMB, CKMBINDEX, TROPONINI in the last 168 hours.  Recent Labs  08/01/14 0333 08/02/14 0415 08/03/14 0318  LABPROT 19.4*  18.5* 16.9*  INR 1.62* 1.52* 1.36   No results for input(s): COLORURINE, LABSPEC, PHURINE, GLUCOSEU, HGBUR, BILIRUBINUR, KETONESUR, PROTEINUR, UROBILINOGEN, NITRITE, LEUKOCYTESUR in the last 72 hours.  Invalid input(s): APPERANCEUR     Component Value Date/Time   CHOL 146 03/26/2013 0820   TRIG 91 03/26/2013 0820   HDL 47 03/26/2013 0820   CHOLHDL 3.1 03/26/2013 0820   VLDL 18 03/26/2013 0820   LDLCALC 81 03/26/2013 0820   Lab Results  Component Value Date   HGBA1C 5.7* 03/26/2013      Component Value Date/Time   LABOPIA NONE DETECTED 03/26/2013 1408   COCAINSCRNUR NONE DETECTED 03/26/2013 1408   LABBENZ NONE DETECTED 03/26/2013 1408   AMPHETMU NONE DETECTED 03/26/2013 1408   THCU NONE DETECTED 03/26/2013 1408   LABBARB NONE DETECTED 03/26/2013 1408    No results for input(s): ETH in the last 168 hours.  I have personally reviewed the radiological images below and agree with the radiology interpretations.  Ct Head Wo Contrast  08/02/2014   IMPRESSION: 1. No acute abnormality. 2. Stable old infarcts, as described above. 3. Stable mild to moderate diffuse cerebral atrophy. These results were called by telephone at the time of interpretation on 08/02/2014 at 9:32 am to Dr. Thad Ranger, who verbally acknowledged these results.    Dg Chest Port 1 View  08/01/2014   IMPRESSION: Persisting bilateral airspace disease,  which is unchanged since the most recent plain films though has improved since the comparisons of 07/20/2014.  Persisting small pleural effusions.    Carotid Doppler  Pending  CUS 02/2013 - Findings suggest 1-39% right internal carotid artery stenosis. The left internal carotid artery demonstrates elevated peak systolic velocities suggestive of 80-99% stenosis, elevated end diastolic velocities suggestive of 40-59% stenosis, and ICA/CCA ratio suggestive of upper range 40-59% stenosis. The left ECA demonstrates elevated velocities suggestive of stenosis. Vertebral arteries  are patent with antegrade flow.  TCD pending  2D Echocardiogram  - EF 25-30%, Global hypokinesis and inferior lateral akinesis; overall severely reduced LV function; grade 2 diastolic dysfunction with elevated LV filling pressure; bioprosthetic aortic valve; mild AI; moderate LAE; mild MR and TR; mildly elevated pulmonary pressure.  PHYSICAL EXAM  Temp:  [97.9 F (36.6 C)-98.7 F (37.1 C)] 98.1 F (36.7 C) (05/08 0300) Pulse Rate:  [71-79] 71 (05/08 0948) Resp:  [18-20] 18 (05/08 0948) BP: (109-134)/(32-45) 134/41 mmHg (05/08 0943) SpO2:  [94 %-98 %] 96 % (05/08 0948) Weight:  [148 lb 11.2 oz (67.45 kg)] 148 lb 11.2 oz (67.45 kg) (05/08 0300)  General - Well nourished, well developed, in no apparent distress.  Ophthalmologic - Sharp disc margins OU.  Cardiovascular - Regular rate and rhythm with no murmur.  Mental Status -  Level of arousal and orientation to time, place, and person were intact. Language including expression, naming, repetition, comprehension was assessed and found intact. Fund of Knowledge was assessed and was intact.  Cranial Nerves II - XII - II - Visual field intact OU. III, IV, VI - Extraocular movements intact. V - Facial sensation intact bilaterally. VII - Facial movement intact bilaterally. VIII - Hearing & vestibular intact bilaterally. X - Palate elevates symmetrically. XI - Chin turning & shoulder shrug intact bilaterally. XII - Tongue protrusion intact.  Motor Strength - The patient's strength was normal in all extremities and pronator drift was absent.  Bulk was normal and fasciculations were absent.   Motor Tone - Muscle tone was assessed at the neck and appendages and was normal.  Reflexes - The patient's reflexes were symmetrical in all extremities and he had no pathological reflexes.  Sensory - Light touch, temperature/pinprick were assessed and were symmetrical.    Coordination - The patient had normal movements in the  hands with no ataxia or dysmetria.  Tremor was absent.  Gait and Station - deferred due to safety concerns.   ASSESSMENT/PLAN [name] is a 81 y.o. male with history of CAD and the STEMI in 2015, HTN, CKD, HLD, CHF with EF 30-35%, s/p AVR and pacemaker, pAfib on Coumadin, PAD s/p stent on plavix, stroke in 2000, 2007, and a TIA in 2014, s/p right CEA with left body stenosis admitted for pneumonia and fluid overload. His Coumadin and Plavix was on hold for bronchoscope, but later resumed Coumadin with IV heparin bridge. 08/02/14 morning, he went to bathroom, felt dizzy and fell, found to have difficulty getting words out, left facial and left arm weakness, episodic, wax and wane, seems also involving right hand weakness and numbness. Symptoms now resolved. Plavix resumed 08/03/14.  Stroke/TIA:  Seems to have both left and right side involvement, difficult for localization. His episode can certainly be due to paroxysmal A. fib, however he was on heparin drip. He also has carotid artery stenosis s/p right CEA, and the left high-grade stenosis. However, he failed left carotid stent due to tortuosity of the left ICA. He  then lost to follow-up with vascular surgeon in [facility].  MRI  MRA not able to perform due to pacer  CT showed old bilateral infarcts but no acute abnormality  Will repeat CT head  Carotid Doppler pending, but 02/2013 left ICA high grade stenosis and Dr. Sondra Come in [facility] failed left CAS due to ICA tortuosity.  2D Echo  EF 25-30%  LDL pending  HgbA1c pending  TCD pending  Heparin drip for VTE prophylaxis  Diet Heart Room service appropriate?: Yes; Fluid consistency:: Thin   clopidogrel 75 mg orally every day and warfarin prior to admission, now on clopidogrel 75 mg orally every day, warfarin and heparin  Patient counseled to be compliant with his antithrombotic medications  Ongoing aggressive stroke risk factor management  Paroxysmal A.  Fib  On heparin drip  On Coumadin  INR subtherapeutic  On amiodarone  Carotid stenosis  Status post right CEA  Left carotid stenosis but failed CAS  Carotid Doppler pending  Carotid Doppler in December 2014 showed right ICA patent, left ICA 80-99% stenosis  Agree to resume Plavix  Family request a vascular surgeon in [place] system to follow up with  CAD with STEMI in 2015  Cardiology is following  On Plavix and torsemide  EF 25-30%  Hypertension  Home meds:   Coreg, hydralazine, spironolactone, torsemide Currently on Coreg, hydralazine, torsemide BP goal normotensive  Stable  Patient counseled to be compliant with his blood pressure medications  Hyperlipidemia  Home meds:  Lipitor 40   Currently on Lipitor 40  LDL pending, goal < 70  Continue statin at discharge  Other Stroke Risk Factors  Advanced age  Hx stroke/TIA in 2000, 2007, and 2014  Coronary artery disease  PAD s/p left femoral stenting  Other Active Problems  INR subtherapeutic  Chronic kidney disease  Anemia  Leukocytosis, improving  Other Pertinent History  S/p AVR  Pacemaker in place  Hospital day # 14  I have personally obtained the history, examined the patient, evaluated laboratory data, individually viewed imaging studies and agree with radiology interpretations. I also obtained additional history from pt's wife at bedside.   Marvel Plan, MD PhD Stroke Neurology 08/03/2014 1:08 PM    To contact Stroke Continuity provider, please refer to [email or web address]. After hours, contact General Neurology

## 2014-08-04 ENCOUNTER — Inpatient Hospital Stay (HOSPITAL_COMMUNITY): Payer: Medicare HMO

## 2014-08-04 ENCOUNTER — Ambulatory Visit (HOSPITAL_COMMUNITY): Payer: Medicare HMO

## 2014-08-04 DIAGNOSIS — I6522 Occlusion and stenosis of left carotid artery: Secondary | ICD-10-CM

## 2014-08-04 DIAGNOSIS — R918 Other nonspecific abnormal finding of lung field: Secondary | ICD-10-CM | POA: Insufficient documentation

## 2014-08-04 DIAGNOSIS — I6529 Occlusion and stenosis of unspecified carotid artery: Secondary | ICD-10-CM

## 2014-08-04 DIAGNOSIS — G459 Transient cerebral ischemic attack, unspecified: Secondary | ICD-10-CM

## 2014-08-04 DIAGNOSIS — I5022 Chronic systolic (congestive) heart failure: Secondary | ICD-10-CM

## 2014-08-04 DIAGNOSIS — Z954 Presence of other heart-valve replacement: Secondary | ICD-10-CM

## 2014-08-04 DIAGNOSIS — Z7901 Long term (current) use of anticoagulants: Secondary | ICD-10-CM

## 2014-08-04 LAB — BASIC METABOLIC PANEL
ANION GAP: 9 (ref 5–15)
BUN: 69 mg/dL — ABNORMAL HIGH (ref 6–20)
CALCIUM: 8.8 mg/dL — AB (ref 8.9–10.3)
CHLORIDE: 92 mmol/L — AB (ref 101–111)
CO2: 36 mmol/L — ABNORMAL HIGH (ref 22–32)
CREATININE: 1.81 mg/dL — AB (ref 0.61–1.24)
GFR calc non Af Amer: 33 mL/min — ABNORMAL LOW (ref >60)
GFR, EST AFRICAN AMERICAN: 39 mL/min — AB (ref >60)
Glucose, Bld: 97 mg/dL (ref 70–99)
Potassium: 3.9 mmol/L (ref 3.5–5.1)
Sodium: 137 mmol/L (ref 135–145)

## 2014-08-04 LAB — CBC
HCT: 29.8 % — ABNORMAL LOW (ref 39.0–52.0)
Hemoglobin: 9.9 g/dL — ABNORMAL LOW (ref 13.0–17.0)
MCH: 31.2 pg (ref 26.0–34.0)
MCHC: 33.2 g/dL (ref 30.0–36.0)
MCV: 94 fL (ref 78.0–100.0)
Platelets: 440 10*3/uL — ABNORMAL HIGH (ref 150–400)
RBC: 3.17 MIL/uL — ABNORMAL LOW (ref 4.22–5.81)
RDW: 17.9 % — AB (ref 11.5–15.5)
WBC: 11.8 10*3/uL — AB (ref 4.0–10.5)

## 2014-08-04 LAB — HEMOGLOBIN A1C
Hgb A1c MFr Bld: 6.2 % — ABNORMAL HIGH (ref 4.8–5.6)
Mean Plasma Glucose: 131 mg/dL

## 2014-08-04 LAB — PROTIME-INR
INR: 1.39 (ref 0.00–1.49)
PROTHROMBIN TIME: 17.2 s — AB (ref 11.6–15.2)

## 2014-08-04 LAB — HEPARIN LEVEL (UNFRACTIONATED): Heparin Unfractionated: 0.46 IU/mL (ref 0.30–0.70)

## 2014-08-04 MED ORDER — WARFARIN SODIUM 5 MG PO TABS
5.0000 mg | ORAL_TABLET | Freq: Once | ORAL | Status: AC
  Administered 2014-08-04: 5 mg via ORAL
  Filled 2014-08-04: qty 1

## 2014-08-04 NOTE — Consult Note (Signed)
Hospital Consult    Reason for Consult:  ICA stenosis/stroke Referring Physician:  Bensimhon   MRN #:  [id number]  History of Present Illness: This is a 81 y.o. male pt who presented to the hospital on 07/20/14 for worsening shortness of breath and a non productive cough and was associated with wheezing and chills.  The pt does use home O2 at night.  He reports a history of CVA in 2000 with residual left sided weakness. Since that event he was placed on coumadin for newly diagnosed atrial fibrillation. He had a TIA in 2006/2007 which subsequently resulted in him undergoing a right carotid endarterectomy (in [place]). In December 2014, he was hospitalized following a TIA. His carotid duplex at this time demonstrated peak systolic velocities of the left ICA around 80-99%, but with lower end diastolic velocities suggestive of 40-59%.   His PCP Dr. Luiz Iron in [facility] referred him for follow-up with Dr. Sondra Come. He underwent a carotid angiogram with attempt at left carotid stent in January 2014 or 2015. The patient cannot recall exactly the date. The stent was unsuccessful due to tortuosity. During his procedure, he also had a left "thigh" stent placed for "70%" blockage. Per the patient, his "right side" is occluded. Dr. Sondra Come placed him on plavix.   He does have a hx of NSTEMI in 2015 with cardiac catheterization revealing nonobstructive CAD acute on chronic sysstolic heart failure with Ef of 30-35%, CKD 3-4, hx of stroke in 2000, hx of bioprosthetic AVR, he has paroxsymal Afib and is on coumadin for this.  He does have COPD with sob and on home O2 at night.  The pt was admitted for acute hypoxic respiratory failure likely in setting of CHF exacerbation vs acute bronchitis.  He does have a hx of pacemaker.  Pt on Plavix as well as coumadin.  He was placed on Vanc/Zosyn for broadened abx coverage for HCAP and possible aspiration.  CT scan on 07/24/14, revealed severe multilobar PNA with mild overlying CHF.   Pt's diuretics were tweaked throughout admission.  A pulmonary consult was obtained.  A swallow eval did not reveal aspiration.  Abx were discontinued per ID due to clinical course not consistent with typical bacterial infection, vanc/cefepime d/ced on 4/29. On zithromycin from 4/29.  Amiodarone was also discontinued.  Anticoagulation was held for possible bronchoscopy when her INR dropped below 2, heparin was started.  His anticoagulation was restarted on 08/01/14 due to no plans for bronch.  Pt is also on steroids and has had some leukocytosis, which is felt to be some component of this.  His baseline creatinine is around 1.8-2.0.  On his barium swallow, he did have nonspecific esophageal dysmotility and diet recs were continued per speech therapy.  On 08/02/14, rapid response was called to the room where the pt was found to have left sided weakness.  He did have some aphasia and garbled speech as well as a facial droop (RN notes as there previously).  A code stroke was called.  The pt states he had a BM when he stood up and became dizzy and fell.  He does not think he hit his head.  Pt was transported to the CT scan. (unable to have MRI due to PPM).  A neuro consult was obtained.  The pt had another episode where he did have difficulty getting words out, left facial and left arm weakness for 3 minutes, which did resolved.  This was followed by right had weakness and numbness for about  3 minutes and also resolved.  This waxed and waned for about 3-4 times.  CT revealed old bilateral infarct, but no acute abnormality.  He is bridging coumadin with heparin and is also on Plavix, which was started on 08/03/14.    His wife reports the patient sitting on the side of the bed and falling forward. She was able to assist his fall to the ground.   Past Medical History  Diagnosis Date  . Arthritis   . Non-obstructive CAD     a. 12/2013 NSTEMI/Cath: LM nl, LAD 20, LCX 40-50, RCA 20.  Marland Kitchen Hypertension   . Stroke      a. July 2000;  b. January 2007;  c. TIA in 2014.  . CKD (chronic kidney disease), stage III   . GERD (gastroesophageal reflux disease)   . Foot pain, bilateral   . H/O hematuria   . Hyperlipidemia   . Vitamin D deficiency   . Chronic combined systolic and diastolic CHF (congestive heart failure)     a. 12/2013 Echo: EF 30-35%, mod conc LVH, Gr 3 DD, mild AI, mod-sev MR, mod dil LA, mild TR.  Marland Kitchen PAF (paroxysmal atrial fibrillation)     a. CHA2DS2VASc = 7--> Chronic Coumadin.  Marland Kitchen NICM (nonischemic cardiomyopathy)     a. 12/2013 Echo: EF 30-35%.  . Aortic valve prosthesis present     a. 2006: S/P bioprosthetic AVR.  Marland Kitchen PAD (peripheral artery disease)     a. s/p LLE stenting.  . Carotid arterial disease     a. s/p R CEA  . History of tobacco abuse   . Moderate to Severe Mitral Regurgitation     a. 12/2013 Echo: Mod-Sev MR.  Marland Kitchen Anxiety   . COPD (chronic obstructive pulmonary disease)   . Coronary arteriosclerosis Oct 2015    med Rx  . Shortness of breath dyspnea   . Depression   . Presence of permanent cardiac pacemaker   . Heart murmur   . Dysrhythmia     Past Surgical History  Procedure Laterality Date  . Aortic valve replacement (avr)/coronary artery bypass grafting (cabg)  12/2008  . Eye surgery    . Carotid angiogram  1996  . Left lower extremity stent    . Carotid endarterectomy    . Insert / replace / remove pacemaker    . Coronary angiogram  Oct 2015  . Pacemaker insertion  Nov 2015    MDT BiV  . Left and right heart catheterization with coronary angiogram N/A 01/14/2014    Procedure: LEFT AND RIGHT HEART CATHETERIZATION WITH CORONARY ANGIOGRAM;  Surgeon: Peter M Swaziland, MD;  Location: [facility] CATH LAB;  Service: Cardiovascular;  Laterality: N/A;  . Bi-ventricular pacemaker insertion N/A 02/17/2014    Procedure: BI-VENTRICULAR PACEMAKER INSERTION (CRT-P);  Surgeon: Marinus Maw, MD;  Location: [facility] CATH LAB;  Service: Cardiovascular;  Laterality: N/A;  . Coronary artery  bypass graft    . Coronary angioplasty    . Tonsillectomy      Allergies  Allergen Reactions  . Ambien [Zolpidem] Other (See Comments)    Hallucinations  . Ativan [Lorazepam] Other (See Comments)    hallucinations  . Oxycontin [Oxycodone] Other (See Comments)    hallucinations  . Penicillins     Unknown childhood reaction   . Sulfa Antibiotics     unknown    Prior to Admission medications   Medication Sig Start Date End Date Taking? Authorizing Provider  acetaminophen (TYLENOL) 500 MG tablet Take 1,000 mg by  mouth at bedtime.    Yes Historical Provider, MD  allopurinol (ZYLOPRIM) 100 MG tablet Take 200 mg by mouth daily.   Yes Historical Provider, MD  amiodarone (PACERONE) 200 MG tablet Take 1 tablet (200 mg total) by mouth daily. 03/10/14  Yes Dolores Patty, MD  atorvastatin (LIPITOR) 40 MG tablet Take 40 mg by mouth every evening.   Yes Historical Provider, MD  Calcium Carb-Cholecalciferol (CALCIUM 600 + D PO) Take 2 tablets by mouth every morning.   Yes Historical Provider, MD  carvedilol (COREG) 6.25 MG tablet Take 1 tablet (6.25 mg total) by mouth 2 (two) times daily. 07/08/14  Yes Rollene Rotunda, MD  clopidogrel (PLAVIX) 75 MG tablet Take 75 mg by mouth daily.   Yes Historical Provider, MD  DULoxetine (CYMBALTA) 60 MG capsule Take 60 mg by mouth daily.  03/18/14  Yes Historical Provider, MD  hydrALAZINE (APRESOLINE) 25 MG tablet Take 0.5 tablets (12.5 mg total) by mouth 2 (two) times daily. 07/07/14  Yes Laurey Morale, MD  isosorbide mononitrate (IMDUR) 30 MG 24 hr tablet Take 1 tablet (30 mg total) by mouth daily. 05/06/14  Yes Amy D Clegg, NP  metolazone (ZAROXOLYN) 5 MG tablet Take 1 tablet (5 mg total) by mouth as needed. FOR WEIGHT 163 LB OR GREATER Patient taking differently: Take 5 mg by mouth as needed (fluid).  04/10/14  Yes Dolores Patty, MD  Omega-3 Fatty Acids (FISH OIL) 1200 MG CAPS Take 1 capsule by mouth every morning.   Yes Historical Provider, MD    omeprazole (PRILOSEC) 40 MG capsule Take 40 mg by mouth every morning.   Yes Historical Provider, MD  potassium chloride SA (K-DUR,KLOR-CON) 20 MEQ tablet Take 1 tablet (20 mEq total) by mouth as needed. TAKE ONLY WHEN YOU TAKE METOLAZONE 04/10/14  Yes Dolores Patty, MD  spironolactone (ALDACTONE) 25 MG tablet Take 1 tablet (25 mg total) by mouth daily. 06/03/14  Yes Laurey Morale, MD  tamsulosin (FLOMAX) 0.4 MG CAPS capsule Take 1 capsule (0.4 mg total) by mouth at bedtime. 04/10/14  Yes Dolores Patty, MD  torsemide (DEMADEX) 20 MG tablet Take 1 tablet (20 mg total) by mouth 2 (two) times daily. Take 40 mg in am and 20 mg in pm 05/30/14  Yes Amy D Clegg, NP  warfarin (COUMADIN) 4 MG tablet Take 2 mg by mouth daily at 6 PM.  02/24/13  Yes Historical Provider, MD    History   Social History  . Marital Status: Married    Spouse Name: N/A  . Number of Children: N/A  . Years of Education: N/A   Occupational History  . Not on file.   Social History Main Topics  . Smoking status: Former Smoker -- 2.00 packs/day for 35 years    Types: Cigarettes    Quit date: 09/26/1998  . Smokeless tobacco: Never Used  . Alcohol Use: 0.6 oz/week    1 Cans of beer per week     Comment: drinks one can of beer or wine daily.  . Drug Use: No  . Sexual Activity: Not on file   Other Topics Concern  . Not on file   Social History Narrative    Family History  Problem Relation Age of Onset  . Stroke Mother   . Cancer Father     ROS: [x]  Positive   [ ]  Negative   [ ]  All sytems reviewed and are negative Cardiovascular: []  chest pain/pressure []  palpitations []  SOB lying  flat []  DOE []  pain in legs while walking []  pain in legs at rest []  pain in legs at night []  non-healing ulcers []  hx of DVT []  swelling in legs  Pulmonary: []  productive cough []  asthma/wheezing []  home O2  Neurologic: []  weakness in []  arms []  legs []  numbness in []  arms []  legs []  hx of CVA []  mini  stroke [] difficulty speaking or slurred speech []  temporary loss of vision in one eye []  dizziness  Hematologic: []  hx of cancer []  bleeding problems []  problems with blood clotting easily  Endocrine:   []  diabetes []  thyroid disease  GI []  vomiting blood []  blood in stool  GU: [x]  CKD/renal failure []  HD--[]  M/W/F or []  T/T/S []  burning with urination []  blood in urine  Psychiatric: []  anxiety []  depression  Musculoskeletal: []  arthritis []  joint pain  Integumentary: []  rashes []  ulcers  Constitutional: []  fever []  chills   Physical Examination  Filed Vitals:   08/04/14 0435  BP: 111/32  Pulse: 68  Temp: 97 F (36.1 C)  Resp: 18   Body mass index is 20.91 kg/(m^2).  General:  WDWN in NAD Gait: Not observed HENT: WNL, normocephalic Pulmonary: normal non-labored breathing Cardiac: regular, with soft murmur  without carotid bruits Abdomen: soft, NT/ND, no masses Skin: without rashes, without ulcers  Vascular Exam/Pulses:  Right Left  Radial 2+ (normal) 1+ (weak)  Femoral absent 2+ (normal)  DP 2+ (normal) 2+ (normal)   Extremities: without ischemic changes, without Gangrene , with cellulitis; without open wounds;  Musculoskeletal: no muscle wasting or atrophy  Neurologic: A&O X 3; CN II-XII grossly intact. Appropriate Affect ; SENSATION: normal; MOTOR FUNCTION: 5/5 strength all extremities except 4/5 right lower extremity . Speech is fluent/normal  CBC    Component Value Date/Time   WBC 11.8* 08/04/2014 0352   RBC 3.17* 08/04/2014 0352   HGB 9.9* 08/04/2014 0352   HCT 29.8* 08/04/2014 0352   PLT 440* 08/04/2014 0352   MCV 94.0 08/04/2014 0352   MCH 31.2 08/04/2014 0352   MCHC 33.2 08/04/2014 0352   RDW 17.9* 08/04/2014 0352   LYMPHSABS 0.2* 07/20/2014 0028   MONOABS 0.6 07/20/2014 0028   EOSABS 0.1 07/20/2014 0028   BASOSABS 0.0 07/20/2014 0028    BMET    Component Value Date/Time   NA 137 08/04/2014 0352   K 3.9 08/04/2014 0352    CL 92* 08/04/2014 0352   CO2 36* 08/04/2014 0352   GLUCOSE 97 08/04/2014 0352   BUN 69* 08/04/2014 0352   CREATININE 1.81* 08/04/2014 0352   CREATININE 1.34 02/14/2014 1015   CALCIUM 8.8* 08/04/2014 0352   GFRNONAA 33* 08/04/2014 0352   GFRAA 39* 08/04/2014 0352    COAGS: Lab Results  Component Value Date   INR 1.39 08/04/2014   INR 1.36 08/03/2014   INR 1.52* 08/02/2014    Statin:  Yes.   Beta Blocker:  Yes Aspirin:  No. ACEI:  No. ARB:  No. Other antiplatelets/anticoagulants:  Yes.   coumadin/Plavix   ASSESSMENT/PLAN: This is a 81 y.o. male with TIA versus stroke with atrial fibrillation currently on coumadin and plavix. He has had a history of CVA in the past on coumadin alone. He has had a right carotid endarterectomy in 2006 or 2007 and carotid angiogram and attempted left carotid stent in 2014 or 2015 (patient cannot recall) by Dr. Sondra Come in [facility]. The stent was unsuccessful due to carotid tortuosity.  His last carotid duplex study from 02/2013 demonstrated peak  systolic velocities of left ICA ~300 cm/s suggestive of 80-99% stenosis but end diastolic velocities of ~40 cm/s suggestive of 40-59% stenosis. Carotid duplex pending.    Given the patient's cardiac and pulmonary status, do not plan on any interventions at this time. He will need formal cardiac clearance if we plan to perform surgery. We will need imaging via CTA or carotid angiogram to better evaluate his anatomy and determine the location of his lesion.  Unfortunately, this will involve contrast. The patient has CKD stage III with baseline creatinine 1.8-2. Dr. Arbie Cookey to evaluate patient and make further plans.   Maris Berger, PA-C [facility] [phone]  I have examined the patient, reviewed and agree with above. Had long discussion with the patient and his wife present. His carotid duplex today shows no significant narrowing at the less than 40% on his right endarterectomy site. On the left  side he has 40-60% stenosis. Regarding his symptoms, his initial neurologic deficit was flaccid left arm. He reported that following this he did have some numbness in his right arm. I do not feel that he has any symptoms related to his left carotid. He has moderate possibly moderate to severe stenosis. Certainly would not recommend any further intervention. Does have renal insufficiency whatsoever would not recommend CTA for further evaluation unless he had specific focal left brain event. Recommend a repeat carotid duplex in one year for follow-up of his asymptomatic moderate stenosis. Will not follow actively.  Gretta Began, MD 08/04/2014 7:51 PM

## 2014-08-04 NOTE — Progress Notes (Signed)
STROKE TEAM PROGRESS NOTE   SUBJECTIVE (INTERVAL HISTORY) No family is at the bedside.  Overall he feels his condition is completely resolved. No recurrent symptoms. His INR still 1.39, on coumadin, plavix and heparin drip. CUS and TCD pending. Vascular surgery has been consulted. Repeat CT no acute infarct.   OBJECTIVE Temp:  [97 F (36.1 C)-97.4 F (36.3 C)] 97.4 F (36.3 C) (05/09 1004) Pulse Rate:  [65-79] 65 (05/09 1036) Cardiac Rhythm:  [-] Ventricular paced (05/09 0730) Resp:  [18] 18 (05/09 1004) BP: (108-119)/(29-40) 119/35 mmHg (05/09 1036) SpO2:  [93 %-98 %] 93 % (05/09 1004) Weight:  [145 lb 11.2 oz (66.089 kg)] 145 lb 11.2 oz (66.089 kg) (05/09 0435)  No results for input(s): GLUCAP in the last 168 hours.  Recent Labs Lab 07/31/14 0458 08/01/14 0333 08/02/14 0415 08/03/14 0318 08/04/14 0352  NA 136 140 137 136 137  K 4.3 4.6 4.2 4.3 3.9  CL 94* 95* 94* 93* 92*  CO2 32 34* 35* 35* 36*  GLUCOSE 127* 106* 98 133* 97  BUN 82* 78* 68* 61* 69*  CREATININE 1.91* 2.02* 1.69* 1.84* 1.81*  CALCIUM 8.8* 9.3 8.8* 8.9 8.8*   No results for input(s): AST, ALT, ALKPHOS, BILITOT, PROT, ALBUMIN in the last 168 hours.  Recent Labs Lab 07/31/14 0458 08/01/14 1137 08/02/14 0415 08/03/14 0318 08/04/14 0352  WBC 18.3* 22.0* 14.1* 11.8* 11.8*  HGB 9.5* 9.7* 9.3* 9.5* 9.9*  HCT 27.7* 29.2* 27.4* 28.3* 29.8*  MCV 91.1 95.4 94.8 93.4 94.0  PLT 362 390 384 412* 440*   No results for input(s): CKTOTAL, CKMB, CKMBINDEX, TROPONINI in the last 168 hours.  Recent Labs  08/02/14 0415 08/03/14 0318 08/04/14 0352  LABPROT 18.5* 16.9* 17.2*  INR 1.52* 1.36 1.39   No results for input(s): COLORURINE, LABSPEC, PHURINE, GLUCOSEU, HGBUR, BILIRUBINUR, KETONESUR, PROTEINUR, UROBILINOGEN, NITRITE, LEUKOCYTESUR in the last 72 hours.  Invalid input(s): APPERANCEUR     Component Value Date/Time   CHOL 147 08/03/2014 1630   TRIG 63 08/03/2014 1630   HDL 54 08/03/2014 1630   CHOLHDL  2.7 08/03/2014 1630   VLDL 13 08/03/2014 1630   LDLCALC 80 08/03/2014 1630   Lab Results  Component Value Date   HGBA1C 6.2* 08/03/2014      Component Value Date/Time   LABOPIA NONE DETECTED 03/26/2013 1408   COCAINSCRNUR NONE DETECTED 03/26/2013 1408   LABBENZ NONE DETECTED 03/26/2013 1408   AMPHETMU NONE DETECTED 03/26/2013 1408   THCU NONE DETECTED 03/26/2013 1408   LABBARB NONE DETECTED 03/26/2013 1408    No results for input(s): ETH in the last 168 hours.  I have personally reviewed the radiological images below and agree with the radiology interpretations.  Ct Head Wo Contrast 08/04/14 - Remote infarctions as detailed above. No evidence of new infarct or of infarct extension. Moderate chronic atrophy.  08/02/2014   IMPRESSION: 1. No acute abnormality. 2. Stable old infarcts, as described above. 3. Stable mild to moderate diffuse cerebral atrophy. These results were called by telephone at the time of interpretation on 08/02/2014 at 9:32 am to Dr. Thad Durham, who verbally acknowledged these results.    Dg Chest Port 1 View  08/01/2014   IMPRESSION: Persisting bilateral airspace disease, which is unchanged since the most recent plain films though has improved since the comparisons of 07/20/2014.  Persisting small pleural effusions.    Carotid Doppler  Pending  CUS 02/2013 - Findings suggest 1-39% right internal carotid artery stenosis. The left internal carotid artery demonstrates  elevated peak systolic velocities suggestive of 80-99% stenosis, elevated end diastolic velocities suggestive of 40-59% stenosis, and ICA/CCA ratio suggestive of upper range 40-59% stenosis. The left ECA demonstrates elevated velocities suggestive of stenosis. Vertebral arteries are patent with antegrade flow.  TCD pending  2D Echocardiogram  - EF 25-30%, Global hypokinesis and inferior lateral akinesis; overall severely reduced LV function; grade 2 diastolic dysfunction with elevated LV filling  pressure; bioprosthetic aortic valve; mild AI; moderate LAE; mild MR and TR; mildly elevated pulmonary pressure.  PHYSICAL EXAM  Temp:  [97 F (36.1 C)-97.4 F (36.3 C)] 97.4 F (36.3 C) (05/09 1004) Pulse Rate:  [65-79] 65 (05/09 1036) Resp:  [18] 18 (05/09 1004) BP: (108-119)/(29-40) 119/35 mmHg (05/09 1036) SpO2:  [93 %-98 %] 93 % (05/09 1004) Weight:  [145 lb 11.2 oz (66.089 kg)] 145 lb 11.2 oz (66.089 kg) (05/09 0435)  General - Well nourished, well developed, in no apparent distress.  Ophthalmologic - Sharp disc margins OU.  Cardiovascular - Regular rate and rhythm with no murmur.  Mental Status -  Level of arousal and orientation to time, place, and person were intact. Language including expression, naming, repetition, comprehension was assessed and found intact. Fund of Knowledge was assessed and was intact.  Cranial Nerves II - XII - II - Visual field intact OU. III, IV, VI - Extraocular movements intact. V - Facial sensation intact bilaterally. VII - Facial movement intact bilaterally. VIII - Hearing & vestibular intact bilaterally. X - Palate elevates symmetrically. XI - Chin turning & shoulder shrug intact bilaterally. XII - Tongue protrusion intact.  Motor Strength - The patient's strength was normal in all extremities and pronator drift was absent.  Bulk was normal and fasciculations were absent.   Motor Tone - Muscle tone was assessed at the neck and appendages and was normal.  Reflexes - The patient's reflexes were symmetrical in all extremities and he had no pathological reflexes.  Sensory - Light touch, temperature/pinprick were assessed and were symmetrical.    Coordination - The patient had normal movements in the hands with no ataxia or dysmetria.  Tremor was absent.  Gait and Station - deferred due to safety concerns.   ASSESSMENT/PLAN [name] is a 81 y.o. male with history of CAD and the STEMI in 2015, HTN, CKD, HLD, CHF with EF  30-35%, s/p AVR and pacemaker, pAfib on Coumadin, PAD s/p stent on plavix, stroke in 2000, 2007, and a TIA in 2014, s/p right CEA with left body stenosis admitted for pneumonia and fluid overload. His Coumadin and Plavix was on hold for bronchoscope, but later resumed Coumadin with IV heparin bridge. 08/02/14 morning, he went to bathroom, felt dizzy and fell, found to have difficulty getting words out, left facial and left arm weakness, episodic, wax and wane, seems also involving right hand weakness and numbness. Symptoms now resolved. Plavix resumed 08/03/14.  Stroke/TIA:  Seems to have both left and right side involvement, difficult for localization. His episode can certainly be due to paroxysmal A. fib, however he was on heparin drip. He also has carotid artery stenosis s/p right CEA, and the left high-grade stenosis. However, he failed left carotid stent due to tortuosity of the left ICA. He then lost to follow-up with vascular [facility].  MRI  MRA not able to perform due to pacer  CT showed old bilateral infarcts but no acute abnormality  repeat CT head no new infarct  Carotid Doppler pending, but  02/2013 left ICA high grade stenosis and Dr. Sondra Come in [facility] failed left CAS due to ICA tortuosity.  2D Echo  EF 25-30%  LDL 80  HgbA1c 6.2`  TCD pending  Heparin drip for VTE prophylaxis  Diet Heart Room service appropriate?: Yes; Fluid consistency:: Thin   clopidogrel 75 mg orally every day and warfarin prior to admission, now on clopidogrel 75 mg orally every day, warfarin and heparin  Patient counseled to be compliant with his antithrombotic medications  Ongoing aggressive stroke risk factor management  Paroxysmal A. Fib  On heparin drip  On Coumadin  INR subtherapeutic 1.36->1.39  On amiodarone  Carotid stenosis  Status post right CEA  Left carotid stenosis but failed CAS  Carotid Doppler pending  Carotid Doppler in December 2014  showed right ICA patent, left ICA 80-99% stenosis  Agree to resume Plavix  Vascular surgery on board  Avoid hypotension  CAD with STEMI in 2015  Cardiology is following  On Plavix and torsemide  EF 25-30%  Hypertension  Home meds:   Coreg, hydralazine, spironolactone, torsemide Currently on Coreg, hydralazine, torsemide BP goal normotensive  Stable  Patient counseled to be compliant with his blood pressure medications  Hyperlipidemia  Home meds:  Lipitor 40   Currently on Lipitor 40  LDL 80, goal < 70  Continue statin at discharge  Other Stroke Risk Factors  Advanced age  Hx stroke/TIA in 2000, 2007, and 2014  Coronary artery disease  PAD s/p left femoral stenting  Other Active Problems  INR subtherapeutic  Chronic kidney disease  Anemia  Leukocytosis, improving  Other Pertinent History  S/p AVR  Pacemaker in place  Hospital day # 15  Marvel Plan, MD PhD Stroke Neurology 08/04/2014 1:41 PM    To contact Stroke Continuity provider, please refer to [email or web address]. After hours, contact General Neurology

## 2014-08-04 NOTE — Progress Notes (Signed)
While being assisted by wife after a bowel movement. Patient sat on the edge of the bed and slid to the floor. Stated by wife. Patient did not hit his head. Patient also did not have on bed room slippers because he took them off prior to him leaving the unit for X-ray and he prefers to wear to the slippers because the non-slip socks are to tight. Patient nor wife called for assistance, until after patient had been assisted by the wife to the floor.

## 2014-08-04 NOTE — Progress Notes (Signed)
ANTICOAGULATION CONSULT NOTE - Follow Up Consult  Pharmacy Consult for Heparin and Coumadin Indication: atrial fibrillation and CVA  Patient Measurements: Height: 5\' 10"  (177.8 cm) Weight: 145 lb 11.2 oz (66.089 kg) (scale b) IBW/kg (Calculated) : 73 Heparin Dosing Weight: 67 kg  Vital Signs: Temp: 97.4 F (36.3 C) (05/09 1004) Temp Source: Oral (05/09 1004) BP: 108/29 mmHg (05/09 1004) Pulse Rate: 79 (05/09 1004)  Labs:  Recent Labs  08/02/14 0415 08/03/14 0318 08/04/14 0352  HGB 9.3* 9.5* 9.9*  HCT 27.4* 28.3* 29.8*  PLT 384 412* 440*  LABPROT 18.5* 16.9* 17.2*  INR 1.52* 1.36 1.39  HEPARINUNFRC 0.61 0.61 0.46  CREATININE 1.69* 1.84* 1.81*    Estimated Creatinine Clearance: 29.9 mL/min (by C-G formula based on Cr of 1.81).  Assessment:   81 yr old male on Coumadin prior to admission for PAF.    Home Coumadin regimen: 2 mg daily. INR 2.99 on admit 4/24.   Coumadin and Plavix held on 5/2 for possible bronchoscopy.   Heparin begun on 5/4 when INR down to 1.96.   Heparin level remains therapeutic today (0.46) on 950 units/hr.   INR = 1.39 today, slow trend up   Goal of Therapy:  INR 2-3 Heparin level 0.3-0.7 units/ml Monitor platelets by anticoagulation protocol: Yes   Plan:   Continue heparin drip at 950 units/hr.  Coumadin 5 mg po x 1 dose tonight  Continue daily heparin level, PT/INR and CBC.  Thank you. Okey Regal, PharmD [phone]  08/04/2014,10:18 AM

## 2014-08-04 NOTE — Progress Notes (Signed)
Name: Timothy Durham MRN: [id number] DOB: Nov 07, 1930    ADMISSION DATE:  07/19/2014 CONSULTATION DATE:  4/29  REFERRING MD :  Roda Shutters  CHIEF COMPLAINT:  Pneumonia not improving   BRIEF PATIENT DESCRIPTION:  81 year old male w/ known sig h/o systolic CM (EF 30-35%), CKD stage IV, prior AVR, and PAF. Admitted 4/23 w/ working dx of PNA w/ element of decompensated HF/pulmonary edema. Treated w/ broad spec abx and lasix until creatinine climbed and interrogation of optivol device on pacemaker suggested euvolemia on 4/27. 4/29 early evening suddenly more short of breath. CT chest obtained showing diffuse ground-glass opacities bilateral. PCCM asked to see re: "pneumonia not improving"  Heavy smoker 2-3PPD until 2000 , > 40 Pyrs  SIGNIFICANT EVENTS    STUDIES:  CT chest 4/29: diffuse patchy GG opacifications bilaterally, small bilateral pleural effusions.  Barium swallow 5/3>>> No esophageal stricture or mass, nonspecific esophageal dysmotility, Stasis in the supine position.  SUBJECTIVE:  oob to chair Breathing better Would like to try to ambulate more.  VITAL SIGNS: Temp:  [97 F (36.1 C)-97.4 F (36.3 C)] 97.4 F (36.3 C) (05/09 1004) Pulse Rate:  [65-79] 65 (05/09 1036) Resp:  [18] 18 (05/09 1004) BP: (108-119)/(29-40) 119/35 mmHg (05/09 1036) SpO2:  [93 %-98 %] 93 % (05/09 1004) Weight:  [145 lb 11.2 oz (66.089 kg)] 145 lb 11.2 oz (66.089 kg) (05/09 0435)  PHYSICAL EXAMINATION: Gen: chronically ill appearing, male NAD  HENT: [place]/AT, PERRL, no JVD noted PULM: resps even, no labored on 5L [place], few scattered crackles CV: Regular, 1/6 SEM GI: BS+, soft, nontender Derm: no cyanosis or rash Psyche: normal mood and affect     Recent Labs Lab 08/02/14 0415 08/03/14 0318 08/04/14 0352  NA 137 136 137  K 4.2 4.3 3.9  CL 94* 93* 92*  CO2 35* 35* 36*  BUN 68* 61* 69*  CREATININE 1.69* 1.84* 1.81*  GLUCOSE 98 133* 97    Recent Labs Lab 08/02/14 0415 08/03/14 0318  08/04/14 0352  HGB 9.3* 9.5* 9.9*  HCT 27.4* 28.3* 29.8*  WBC 14.1* 11.8* 11.8*  PLT 384 412* 440*   CT chest 4/29 images personally reviewed> bilateral patchy consolidation and ground glass in an upper lobe distribution, food in esophagus and in stomach  ASSESSMENT / PLAN:  Acute hypoxic respiratory failure in setting of diffuse bilateral infiltrates.. This started after about 5 months of amiodarone and within 72 hours of exposure to some new pinewood furniture he assembled.  The illness was associated with a low grade temperature, bilateral infiltrates, scant clear sputum production, and dyspnea with new hypoxemia.  He also notes choking on food from time to time and was known to aspirate after his stroke in the early 2000s. Barium swallow nonspecific.   DDx here is broad> amiodarone induced lung toxicity, hypersensitivity pneumonitis (should be better by now), less likely occult malignancy like bronchoalveolar cell carcinoma vs aspiration pneumonitis  Oxygenation slowly improving  Plan Continue supplemental O2 and titrate as able  Continue prednisone for 6 weeks Holding amiodarone in setting suspected lung toxicity Daily CXR No bronch at this time so OK to resume warfarin, plavix from pulmonary standpoint Encourage IS Spirometry as outpt  Dysphagia:  Plan  Nonspecific esophageal dysmotility on barium swallow  Cont diet per speech recs   NICM (EF 30-35%), CKD stage III, h/o CVA, atrial fib, and GERD Plan Per Cardiology, ID and internal medicine teams.    Check satn on RA , ambulation to decide O2 needs  on discharge Have made FU appt with me at [facility] office Taper prednisone by 5 mg every 4 days until off   [facility] V. MD  08/04/2014 11:24 AM

## 2014-08-04 NOTE — Progress Notes (Signed)
CARDIAC REHAB PHASE I   PRE:  Rate/Rhythm: 93 paced  BP:  Sitting: 108/71        SaO2: 96 3L, 88 RA  MODE:  Ambulation: 120 ft   POST:  Rate/Rhythm: 80 paced  BP:  Sitting: 131/36         SaO2: 95 4L  Pt fell with family member earlier today per unit staff.  Pt sitting up in recliner, O2 96% on 3L. Pt O2 sats dropped to 88% on RA at rest.  2L O2 applied, sats increased to 93%.   Pt assisted to bathroom with gait belt, rolling walker, IV and 2LO2.  Small incontinent episode. Pt is somewhat impulsive and tries to move independently of cords, IV pole, walker.  Pt ambulated an additional 160 ft with 2-4L O2, IV,  rolling walker, gait belt, assist x1.  Pt O2 sats 87% ambulation on 2 L, increased to 90% on 4L. Pt c/o DOE and mild dizziness.  Unsteady gait, poor activity tolerance. VSS. Pt states he feels much weaker today. Reviewed CHF education, pt states "he already knows it." Pt returned to recliner, chair alarm on, feet elevated, call bell within reach.  [id number]     Joylene Grapes, RN, BSN 08/04/2014 3:01 PM

## 2014-08-04 NOTE — Progress Notes (Signed)
Transcranial Doppler completed.  Farrel Demark, RDMS, RVT  08/04/2014

## 2014-08-04 NOTE — Progress Notes (Signed)
SATURATION QUALIFICATIONS: (This note is used to comply with regulatory documentation for home oxygen)  Patient Saturations on Room Air at Rest =88%  Patient Saturations on Room Air while Ambulating = N/A  Patient Saturations on 2 Liters of oxygen while Ambulating = 87%, improved to 90% on 4L  Please briefly explain why patient needs home oxygen: Pt requiring 3 L O2 at rest and 4 L during ambulation.

## 2014-08-04 NOTE — Progress Notes (Signed)
Physical Therapy Treatment Patient Details Name: Timothy Durham MRN: [id number] DOB: April 11, 1930 Today's Date: 08/04/2014    History of Present Illness 81 y.o. male admitted with Acute hypoxic respiratory failure, and Nonischemic cardiomyopathy with acute exacerbation.    PT Comments    Pt admitted with above diagnosis. Pt currently with functional limitations due to balance, endurance and safety  deficits.  Pt with decr safety awareness with impulsivity at times.  Has progressed but is still weak.  Wanted to discuss progress with wife however she is not here.  Tried both numbers for wife listed in chart and wife did not answer.  REcommend NHP for therapy short term if wife cannot provide 24 hour physical assist.  Otherwise, keep plan as before with HHPT f/u and RW.  Pt will benefit from skilled PT to increase their independence and safety with mobility to allow discharge to the venue listed below.    Follow Up Recommendations  Home health PT;Supervision/Assistance - 24 hour (if wife cannot provide 24 hour physical assist, NHP )     Equipment Recommendations  Rolling walker with 5" wheels, home O2   Recommendations for Other Services       Precautions / Restrictions Precautions Precautions: Fall Restrictions Weight Bearing Restrictions: No    Mobility  Bed Mobility               General bed mobility comments: sitting in chair with cardiac rehab in room  Transfers Overall transfer level: Needs assistance Equipment used: Rolling walker (2 wheeled) Transfers: Sit to/from Stand Sit to Stand: Min guard         General transfer comment: Pt needs min guard assist for safety.  Did not get feet placed correctly prior to standing and is impulsive in transitioning from sit to stand.    Ambulation/Gait Ambulation/Gait assistance: Min guard Ambulation Distance (Feet): 10 Feet Assistive device: Rolling walker (2 wheeled) Gait Pattern/deviations: Step-through pattern;Decreased  stride length;Steppage;Wide base of support;Narrow base of support   Gait velocity interpretation: Below normal speed for age/gender General Gait Details: Pt used RW.  Pt was mildly unsteady with ambulation with RW from recliner to bathroom.  Pt unaware that he had soiled his pants a little.  PT encouraged pt to change pants and pt did so with reluctance.  Assist to thoroughly clean pt of BM.  Pt stood from toilet holding grab bar in bathroom with min guard assist for safety as again pt moving impulsively.   Pt varies as he sometimes has narrow BOS and sometimes has wide BOS throwing his balance off. Pt walked back to chair to put clean underwear and pants on and then Cardiac Rehab wanted to work with pt.     Stairs            Wheelchair Mobility    Modified Rankin (Stroke Patients Only)       Balance Overall balance assessment: Needs assistance;History of Falls Sitting-balance support: No upper extremity supported;Feet supported Sitting balance-Leahy Scale: Good     Standing balance support: Bilateral upper extremity supported;During functional activity Standing balance-Leahy Scale: Poor Standing balance comment: requires UE support for balance.                      Cognition Arousal/Alertness: Awake/alert Behavior During Therapy: WFL for tasks assessed/performed Overall Cognitive Status: Within Functional Limits for tasks assessed Area of Impairment: Safety/judgement;Following commands;Awareness;Problem solving   Current Attention Level: Focused   Following Commands: Follows one step commands consistently Safety/Judgement: Decreased  awareness of safety Awareness: Intellectual Problem Solving: Difficulty sequencing;Requires verbal cues General Comments: Pt slightly impulsive thus decreasing safety with transtional movements.      Exercises      General Comments General comments (skin integrity, edema, etc.): SpO2 88% on RA at rest.  Reapplied O2 at 3L.  Called  wife to discuss d/c plan but no answer.  Left message.        Pertinent Vitals/Pain Pain Assessment: No/denies pain  SATURATION QUALIFICATIONS: (This note is used to comply with regulatory documentation for home oxygen)  Patient Saturations on Room Air at Rest = 88%  Patient Saturations on Room Air while Ambulating = 83-86%  Patient Saturations on 3  Liters of oxygen while Ambulating = >90%  Please briefly explain why patient needs home oxygen:Pt desats on RA at rest.       Home Living                      Prior Function            PT Goals (current goals can now be found in the care plan section) Progress towards PT goals: Progressing toward goals    Frequency  Min 3X/week    PT Plan Current plan remains appropriate    Co-evaluation             End of Session Equipment Utilized During Treatment: Gait belt;Oxygen Activity Tolerance: Patient limited by fatigue Patient left: in chair;with call bell/phone within reach;with chair alarm set;Other (comment) (with Cardiac rehab in room)     Time: 1610-9604 PT Time Calculation (min) (ACUTE ONLY): 14 min  Charges:  $Self Care/Home Management: 2022/12/14                    G Codes:      WhiteAmadeo Garnet 08/31/14, 3:11 PM Vernette Moise,PT Acute Rehabilitation [phone] (pager)

## 2014-08-04 NOTE — Progress Notes (Signed)
Patient ID: Timothy Durham, male   DOB: 01-30-1931, 81 y.o.   MRN: [id number] Patient ID: Timothy Durham, male   DOB: 03/03/1931, 81 y.o.   MRN: [id number]   Mr. Becraft is an 81 yo male with a history of prior CVA in 2000 secondary to PAF, nonischemic cardimoyopathy s/p [employer] CRT-P (02/17/14), chronic systolic HF (EF 30-35%), non-obstructive CAD, HTN, CKD stage IV, HLD, PAD and bioprosthetic AVR.   Followed closely in HF clinic and recently doing quite well. Diuretics had to be cut back a bit due to hypotension. Weight has been stable around 154 pounds.   Admitted 4/24 ago with worsening SOB with fevers and chills with WBC 12.2 and left shift. Weight stable. Initial CXR with mild vascular congestion. Felt to have acute bronchitis. However dyspnea got worse and CXR 4/26 showed worsening bilateral infiltrates ? HF vs multifocal PNA. IV Lasix and abx initially, has completed antibiotic course.  Steroids increased.   On 5/7, developed dysarthria and left arm weakness.  CT head with no acute changes, unable to do MRI with CRT-P device.  Symptoms totally resolved, TIA.  Neurology following.    Gradual improvement, walking more. CXR looking better.   CT chest with diffuse, bilateral consolidated airspace disease.    Barium swallow with "nonspecific esophageal dysmotility disorder."   Scheduled Meds: . allopurinol  200 mg Oral Daily  . atorvastatin  40 mg Oral QPM  . carvedilol  6.25 mg Oral BID WC  . clopidogrel  75 mg Oral Daily  . DULoxetine  60 mg Oral Daily  . hydrALAZINE  12.5 mg Oral BID  . isosorbide mononitrate  30 mg Oral Daily  . multivitamin with minerals  1 tablet Oral Daily  . pantoprazole  80 mg Oral Daily  . predniSONE  40 mg Oral Q breakfast  . sodium chloride  3 mL Intravenous Q12H  . tamsulosin  0.8 mg Oral QHS  . torsemide  20 mg Oral QPM  . torsemide  40 mg Oral Q breakfast  . Warfarin - Pharmacist Dosing Inpatient   Does not apply q1800   Continuous Infusions: . heparin  950 Units/hr (08/03/14 0236)   PRN Meds:.acetaminophen, albuterol   Filed Vitals:   08/03/14 0943 08/03/14 0948 08/03/14 2002 08/04/14 0435  BP: 134/41  119/40 111/32  Pulse:  71 71 68  Temp:   97.2 F (36.2 C) 97 F (36.1 C)  TempSrc:   Oral Oral  Resp:  18 18 18   Height:      Weight:    145 lb 11.2 oz (66.089 kg)  SpO2:  96% 96% 98%    Intake/Output Summary (Last 24 hours) at 08/04/14 0846 Last data filed at 08/04/14 0834  Gross per 24 hour  Intake    840 ml  Output   2070 ml  Net  -1230 ml    LABS: Basic Metabolic Panel:  Recent Labs  78/29/56 0318 08/04/14 0352  NA 136 137  K 4.3 3.9  CL 93* 92*  CO2 35* 36*  GLUCOSE 133* 97  BUN 61* 69*  CREATININE 1.84* 1.81*  CALCIUM 8.9 8.8*   Liver Function Tests: No results for input(s): AST, ALT, ALKPHOS, BILITOT, PROT, ALBUMIN in the last 72 hours. No results for input(s): LIPASE, AMYLASE in the last 72 hours. CBC:  Recent Labs  08/03/14 0318 08/04/14 0352  WBC 11.8* 11.8*  HGB 9.5* 9.9*  HCT 28.3* 29.8*  MCV 93.4 94.0  PLT 412* 440*   Cardiac Enzymes: No results  for input(s): CKTOTAL, CKMB, CKMBINDEX, TROPONINI in the last 72 hours. BNP: Invalid input(s): POCBNP D-Dimer: No results for input(s): DDIMER in the last 72 hours. Hemoglobin A1C: No results for input(s): HGBA1C in the last 72 hours. Fasting Lipid Panel:  Recent Labs  08/03/14 1630  CHOL 147  HDL 54  LDLCALC 80  TRIG 63  CHOLHDL 2.7   Thyroid Function Tests: No results for input(s): TSH, T4TOTAL, T3FREE, THYROIDAB in the last 72 hours.  Invalid input(s): FREET3 Anemia Panel: No results for input(s): VITAMINB12, FOLATE, FERRITIN, TIBC, IRON, RETICCTPCT in the last 72 hours.  RADIOLOGY: Dg Chest 2 View  07/31/2014   CLINICAL DATA:  Acute respiratory failure with hypoxia. Pt hx COPD, CAD  EXAM: CHEST  2 VIEW  COMPARISON:  07/30/2014  FINDINGS: Patchy areas of airspace and interstitial opacities in the lungs are stable from most  recent prior exam. No new lung abnormalities. No significant pleural effusion and no pneumothorax.  Changes from cardiac surgery and aortic valve replacement are stable. Cardiac silhouette is mildly enlarged. No mediastinal or hilar masses. Left anterior chest wall sequential pacemaker is stable and well positioned.  IMPRESSION: No change from the previous day's study. Bilateral patchy airspace and interstitial lung opacities are stable. No new abnormalities.   Electronically Signed   By: Amie Portland M.D.   On: 07/31/2014 13:02   Dg Chest 2 View  07/30/2014   CLINICAL DATA:  Hypoxia.  Weakness.  EXAM: CHEST  2 VIEW  COMPARISON:  07/28/2014.  FINDINGS: There is improvement from 07/28/2014, with partial clearance of the multifocal bilateral airspace opacities although significant opacities persist, left greater than right. Mild right lateral base pleural thickening persists. No significant effusion is evident. There is moderate unchanged cardiomegaly. There is prior sternotomy and aortic valvuloplasty. There are intact appearances of the transvenous leads.  IMPRESSION: Slight improvement. Multifocal bilateral airspace opacities persist, partially cleared.   Electronically Signed   By: Ellery Plunk M.D.   On: 07/30/2014 06:50   Dg Chest 2 View  07/28/2014   CLINICAL DATA:  Respiratory failure .  EXAM: CHEST  2 VIEW  COMPARISON:  07/22/2014 .  CT 07/25/2014.  FINDINGS: Mediastinum and hilar structures are normal. Prior cardiac valve replacement. Cardiac pacer noted with lead tips in stable position. Persistent multifocal bilateral pulmonary infiltrates, no interim change. Small right pleural effusion. No pneumothorax.  IMPRESSION: 1. Persistent multifocal bilateral pulmonary infiltrates most consistent with pneumonia. No significant interim change.  2. Prior cardiac valve replacement. Cardiac pacer stable position. Stable cardiomegaly .   Electronically Signed   By: Maisie Fus  Register   On: 07/28/2014 08:33    Dg Chest 2 View  07/20/2014   CLINICAL DATA:  Shortness of breath.  Pacemaker.  EXAM: CHEST  2 VIEW  COMPARISON:  05/04/2014.  FINDINGS: Mildly increased cardiac silhouette. Triple lead pacer unchanged from priors. Moderate vascular congestion without overt failure or significant consolidation. Mild to moderate pleural effusions, greater on the RIGHT. Aortic valvular placement with prior median sternotomy. No acute osseous findings.  IMPRESSION: Cardiomegaly with stable appearance of the transvenous pacer.  Moderate vascular congestion without overt failure. Overall improved from prior radiograph in February.   Electronically Signed   By: Davonna Belling M.D.   On: 07/20/2014 02:23   Ct Head Wo Contrast  08/04/2014   CLINICAL DATA:  Episode yesterday of difficulty word finding, left facial on left arm weakness. This was followed by right upper extremity weakness and paresthesias.  EXAM: CT HEAD  WITHOUT CONTRAST  TECHNIQUE: Contiguous axial images were obtained from the base of the skull through the vertex without intravenous contrast.  COMPARISON:  CT 08/02/2014, MR 03/26/2013.  FINDINGS: There is no intracranial hemorrhage or evidence of acute infarction. There is no extra-axial fluid collection. There is encephalomalacia due to remote infarction in the right occipital, right frontoparietal and left posterior frontal regions, these remote infarctions are unchanged from the prior MRI. No new infarction is evident. There is no mass, mass effect or midline shift. There is moderate generalized atrophy. No significant bony abnormality is evident. No interval change is evident from yesterday's CT.  IMPRESSION: Remote infarctions as detailed above. No evidence of new infarct or of infarct extension. Moderate chronic atrophy.   Electronically Signed   By: Ellery Plunk M.D.   On: 08/04/2014 02:24   Ct Head Wo Contrast  08/02/2014   CLINICAL DATA:  Right facial droop.  History of stroke.  EXAM: CT HEAD WITHOUT  CONTRAST  TECHNIQUE: Contiguous axial images were obtained from the base of the skull through the vertex without intravenous contrast.  COMPARISON:  Head CT dated 03/25/2013 and brain MR dated 03/26/2013.  FINDINGS: Previously demonstrated in bold right occipital, right frontoparietal and small left posterior frontal infarcts are unchanged. Diffusely enlarged ventricles and subarachnoid spaces. No intracranial hemorrhage, mass lesion or CT evidence of acute infarction. Unremarkable bones and included paranasal sinuses.  IMPRESSION: 1. No acute abnormality. 2. Stable old infarcts, as described above. 3. Stable mild to moderate diffuse cerebral atrophy. These results were called by telephone at the time of interpretation on 08/02/2014 at 9:32 am to Dr. Thad Ranger, who verbally acknowledged these results.   Electronically Signed   By: Beckie Salts M.D.   On: 08/02/2014 09:32   Ct Chest Wo Contrast  07/25/2014   CLINICAL DATA:  Hypoxia and progressive shortness of breath for the past 3 days. Nonproductive cough and wheeze  EXAM: CT CHEST WITHOUT CONTRAST  TECHNIQUE: Multidetector CT imaging of the chest was performed following the standard protocol without IV contrast.  COMPARISON:  None.  FINDINGS: THORACIC INLET/BODY WALL:  There is a biventricular pacer from the left with leads in unremarkable position. No acute findings at the thoracic inlet.  There is an incidental 1 cm intramuscular lipoma in the right chest wall.  MEDIASTINUM:  Cardiomegaly which is chronic based on chest x-rays. No pericardial effusion. The patient is status post median sternotomy with aortic valve replacement. The ascending aorta measures 41 mm in diameter. There is mild enlargement of mediastinal lymph nodes which could be reactive to the presumed pneumonia or sequela of remote granulomatous disease (there is also scattered nodal coarse calcification). No acute vascular findings. Negative esophagus.  LUNG WINDOWS:  There is patchy consolidative  and ground-glass opacities in all lobes. Airspace disease is most extensive in the left upper lobe and most dense in the posterior segment right upper lobe. Small bilateral pleural effusions with interlobular septal thickening. No cavitation. No evidence of loculated effusion.  UPPER ABDOMEN:  Granulomatous changes in the liver and spleen.  OSSEOUS:  No acute findings.  IMPRESSION: 1. Multilobar pneumonia. 2. Superimposed interstitial edema and small pleural effusions. 3. Chronic and postoperative findings noted above.   Electronically Signed   By: Marnee Spring M.D.   On: 07/25/2014 08:00   Dg Esophagus  07/29/2014   CLINICAL DATA:  Dysphagia.  Choking.  Bilateral pneumonia.  EXAM: ESOPHOGRAM/BARIUM SWALLOW  TECHNIQUE: Combined double contrast and single contrast examination performed using effervescent  crystals, thick barium liquid, and thin barium liquid.  FLUOROSCOPY TIME:  Radiation Exposure Index (as provided by the fluoroscopic device): Dose area product: 829.79uGy*m^2  COMPARISON:  Radiographs 07/28/2014.  Chest CT 07/25/2014.  FINDINGS: Pharyngeal phase of swallowing was normal. The study was carried out in the AP projection due to patient condition. Nonspecific esophageal dysmotility was present without stricture. 0/3 primary waves reach the gastroesophageal junction. Tertiary contractions were observed. Fetal on esophagus incidentally noted.  There is no bear ear passage of the 13 mm barium tablet challenge however the tablet remain static in the lower esophagus just proximal to the gastroesophageal junction for several min. No gastroesophageal reflux could be elicited with water siphon test.  Esophageal stasis was present in the supine position. Esophageal folds appeared within normal limits.  IMPRESSION: 1. No esophageal stricture or mass. 2. Nonspecific esophageal dysmotility disorder with with tertiary contractions. Stasis in the supine position.   Electronically Signed   By: Andreas Newport M.D.    On: 07/29/2014 09:39   US Renal  07/20/2014   CLINICAL DATA:  Acute kidney injury.  Hypertension.  EXAM: RENAL/URINARY TRACT ULTRASOUND COMPLETE  COMPARISON:  None.  FINDINGS: Right Kidney:  Length: 10.3 cm. Echogenicity within normal limits. No mass or hydronephrosis visualized. Diffuse cortical thinning with prominent renal sinus fat.  Left Kidney:  Length: 11.1 cm. Echogenicity within normal limits. No mass or hydronephrosis visualized. Diffuse cortical thinning with prominent renal sinus fat.  Bladder:  Multiple diverticula. The largest measures 4.9 cm in maximum diameter. Small amount of internal debris with no definite calculi  IMPRESSION: 1. Diffuse bilateral renal cortical atrophy. 2. Multiple bladder diverticula. 3. Small amount of debris in the bladder with no definite calculi.   Electronically Signed   By: Beckie Salts M.D.   On: 07/20/2014 12:27   Dg Chest Port 1 View  08/01/2014   CLINICAL DATA:  81 year old male with a history of pulmonary infiltrates and shortness of breath.  EXAM: PORTABLE CHEST - 1 VIEW  COMPARISON:  Multiple prior, most recent 07/31/2014, 07/30/2014  FINDINGS: Cardiomediastinal silhouette unchanged.  Atherosclerosis.  Unchanged cardiac pacing device with 3 leads in place.  Bilateral airspace disease, involving the right greater than left bases and the left greater than right apices. Distribution is similar to comparison plain film. Over the course of several chest x-ray this has improved, dating to 07/22/2014 and 07/20/2014.  Trace pleural effusions.  Calcified right hilar lymph nodes.  IMPRESSION: Persisting bilateral airspace disease, which is unchanged since the most recent plain films though has improved since the comparisons of 07/20/2014.  Persisting small pleural effusions.  Signed,  Yvone Neu. Loreta Ave, DO  [facility]  [facility]   Electronically Signed   By: Gilmer Mor D.O.   On: 08/01/2014 07:19   Dg Chest Port 1  View  07/22/2014   CLINICAL DATA:  Acute onset of hypoxemia.  Initial encounter.  EXAM: PORTABLE CHEST - 1 VIEW  COMPARISON:  Chest radiograph performed 07/20/2014  FINDINGS: The lungs are well-aerated. Worsening bilateral midlung airspace opacification raises concern for multifocal pneumonia. Small bilateral pleural effusions are seen, and underlying interstitial edema cannot be excluded. Vascular congestion is noted. No pneumothorax is seen.  The cardiomediastinal silhouette is mildly enlarged. The patient is status post median sternotomy. A pacemaker is noted overlying the left chest wall, with leads ending overlying the right atrium and right ventricle. No acute osseous abnormalities are seen.  IMPRESSION: 1. Worsening bilateral mid lung airspace  opacification raises concern for multifocal pneumonia. 2. Small bilateral pleural effusions, with vascular congestion and mild cardiomegaly. Underlying interstitial edema cannot be excluded.   Electronically Signed   By: Roanna Raider M.D.   On: 07/22/2014 04:30    PHYSICAL EXAM General: NAD. Sitting in chair Neck: JVP  8 cm, no thyromegaly or thyroid nodule.  Lungs: + crackles, decreased breath sounds at bases bilaterally.   CV: Nondisplaced PMI.  Heart regular S1/S2, no S3/S4, 2/6 HSM LLSB/apex. 1+ edema right ankle none left. Abdomen: Soft, nontender, no hepatosplenomegaly, no distention.  Neurologic: Alert and oriented x 3.  Psych: Normal affect. Extremities: No clubbing or cyanosis.   TELEMETRY: Reviewed telemetry pt in NSR with BiV pacing    Assessment:   1. 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  2. Acute on chronic systolic heart failure: EF 30-35% by last echo.  3. Acute bronchitis 4. CKD, stage III-IV 5. PAF in NSR on amio - CHDSVASC 5. On warfarin 6. Multifocal airspace disease: ?amiodarone toxicity versus hypersensitivity pneumonitis vs aspiration PNA.   7. TIA 5/7.  History of  right CEA and left tight carotid stenosis, unable to do carotid stent in past.   Plan/Discussion:   Volume status looks good. Continue current torsemide.  Continue current dose of carvedilol, hydralazine, and imdur. No ACEI with CKD.   Workup of airspace disease per pulmonary.  He is now off amiodarone for ?lung toxicity.  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  Prednisone dosing per pulmonary.  Overall, breathing is gradually improving.  Will repeat CXR tomorrow.   TIA on 5/7.  He has PAF but has been on heparin gtt while INR subtherapeutic.  He has a tight left carotid stenosis historically.  He had come off coumadin and Plavix for possible bronchoscopy with biopsy.  Decided to hold off on this and restarted coumadin/heparin gtt but had kept off Plavix for possible bronch in the next couple of weeks. He has actually had a TIA on coumadin and off Plavix in the past.   - Restarted Plavix - Continue heparin gtt until INR > 2.  - Will ask vascular to evaluate.  Has had right CEA and had attempted left carotid stent in past that failed due to carotid tortuousity.  Will repeat carotid dopplers.  He would be relatively high risk for surgery at this point given comorbidities but not out of the question now with pulmonary improvement.       Marca Ancona  08/04/2014 8:46 AM

## 2014-08-04 NOTE — Progress Notes (Signed)
PROGRESS NOTE    Timothy Durham ZOX:[id number] DOB: July 14, 1930 DOA: 07/19/2014 PCP: Dennis Bast, MD  HPI/Brief narrative 81 year old male with history of nonischemic cardiomyopathy with EF of 30-35% as per last echo, NSTEMI in 2015 with cardiac cath showing nonobstructive CAD, chronic kidney disease stage III, history of stroke in 2000, history of bioprosthetic aVR, paroxysmal A. fib on chronic Coumadin, tobacco use with? COPD who presented with progressive shortness of breath for the past 3 days associated with nonproductive cough and wheezing. He reports being compliant with his medications. Chest x-ray on admission showing moderate vascular congestion. Labs showed elevated BNP of 1400. Patient admitted for acute hypoxic respiratory failure. He underwent extensive evaluation by pulmonary, ID and cardiology. At this time his hypoxia is felt to be due to organizing pneumonia from amiodarone which has been discontinued. Started on slow prednisone taper with plans for outpatient follow-up. Hypoxia is improving. On 5/7, patient developed stroke/TIA like presentation and stroke service is consulting. DC home when INR therapeutic >2.  Assessment/Plan:  Acute hypoxic respiratory failure - Initially felt to be due to infectious etiology/pneumonia or bronchitis. - ID and pulmonary consulted - Underwent extensive evaluation and even bronchoscopy was considered - Eventually felt to be due to organizing pneumonia from amiodarone + CHF: Recommendations are to hold amiodarone indefinitely, slow prednisone taper, wean oxygen for >88%.  - Improving  Non-ischemic cardiomyopathy/acute on chronic systolic CHF - Continue Coreg hydralazine, imdur and statin..  - Not on ACEi/ ARB due to CKD - Advanced heart failure team following - Torsemide resumed 5/3  - Euvolemic  Acute on chronic kidney disease stage III - Baseline creatinine around 1.8-2.  - [place] abd shows medical renal ds.  - Creatinine had transiently  worsened on IV Lasix - Now creatinine fluctuating in the 1.6-1.8 range. Monitor closely  History of CVA and peripheral vascular disease/new CVA versus TIA on 5/7 - PTA on Coumadin, Plavix and statin. Patient on both Coumadin and Plavix for A. fib (had TIA on Coumadin alone). - Both Coumadin and Plavix were held while bronchoscopy was being considered. - IV heparin was started when INR dropped to less than 2 - On 5/7, patient sustained a fall and had strokelike presentation. Neurology was consulted - Head CT 5/7 & 5/9 negative for acute stroke or bleeds. - Unable to do MRI brain secondary to patient having pacemaker. - Not candidate for TPA. - Likely had CVA or TIA secondary to PAF versus left ICA stenosis. Remains on IV heparin until Coumadin can become therapeutic. Plavix which was held for possible bronchoscopy was resumed on 5/8. - Stroke team follow-up appreciated. - Echo: LVEF 25-30 percent, LDL 80, A1c 6.2 - Cardiology consulting vascular surgery for evaluation of left ICA high-grade stenosis  Atrial fibrillation - Biventricular paced rhythm - Amiodarone discontinued 5/2 - Anticoagulation held and resumed as stated above - CHDSVASC 5  GERD Continue PPI  Malnutrition,  - low albumin, nutrition consult obtained.  BPH/urinary retention  - of >600 on 5/1, in and out cathx1, increased flomax.  Chronic anemia - Stable  Leukocytosis - Possibly related to steroids - Improved  Diarrhea - Resolved. C. difficile PCR negative.  Fall on 5/7 & 5/9 - No overt injuries. - CT head negative - On 5/9, patient apparently was sitting at edge of bed and his spouse was helping him to bedside chair when he slid down the bed onto the floor and states that he did not sustain any injuries. - Patient and family have been counseled  extensively that he should not attempt to get out of bed without nursing assistance. He verbalizes understanding.  Carotid stenosis - Status post right CEA -  Left high-grade ICA stenosis but failed stenting - Doppler carotid pending - Vascular surgery consultation pending  Essential hypertension - Controlled  Hyperlipidemia - Continue statins   Code Status: DNR Family Communication: Discussed with with patient's spouse at bedside on 08/03/14. None at bedside on 5/9. Disposition Plan: DC home when medically stable and INR >2  Consultants:  Advanced CHF team  PCCM  ID-signed off 5/2  Neurology/stroke service  Procedures:  None  Antibiotics:  Azithromycin- completed   Subjective: Patient seen this morning post fall. According to patient, he was sitting at edge of bed and his wife was attempting to help him from bed to the chair next to the bed when his slid down the bed and landed on the floor. He denies any injuries. He states that he feels fine.  Objective: Filed Vitals:   08/03/14 2002 08/04/14 0435 08/04/14 1004 08/04/14 1036  BP: 119/40 111/32 108/29 119/35  Pulse: 71 68 79 65  Temp: 97.2 F (36.2 C) 97 F (36.1 C) 97.4 F (36.3 C)   TempSrc: Oral Oral Oral   Resp: 18 18 18    Height:      Weight:  66.089 kg (145 lb 11.2 oz)    SpO2: 96% 98% 93%     Intake/Output Summary (Last 24 hours) at 08/04/14 1404 Last data filed at 08/04/14 1036  Gross per 24 hour  Intake    600 ml  Output    700 ml  Net   -100 ml   Filed Weights   08/02/14 0448 08/03/14 0300 08/04/14 0435  Weight: 67.722 kg (149 lb 4.8 oz) 67.45 kg (148 lb 11.2 oz) 66.089 kg (145 lb 11.2 oz)     Exam:  General exam: Pleasant elderly male sitting up comfortably in chair. Respiratory system: Clear to auscultation. No increased work of breathing. Cardiovascular system: S1 & S2 heard, RRR. No JVD, murmurs, gallops, clicks. Trace bilateral leg edema. Telemetry: BiV paced. Gastrointestinal system: Abdomen is nondistended, soft and nontender. Normal bowel sounds heard. Central nervous system: Alert and oriented. No focal deficits. Extremities:  Symmetric 5 x 5 power.  No obvious injuries noted   Data Reviewed: Basic Metabolic Panel:  Recent Labs Lab 07/31/14 0458 08/01/14 0333 08/02/14 0415 08/03/14 0318 08/04/14 0352  NA 136 140 137 136 137  K 4.3 4.6 4.2 4.3 3.9  CL 94* 95* 94* 93* 92*  CO2 32 34* 35* 35* 36*  GLUCOSE 127* 106* 98 133* 97  BUN 82* 78* 68* 61* 69*  CREATININE 1.91* 2.02* 1.69* 1.84* 1.81*  CALCIUM 8.8* 9.3 8.8* 8.9 8.8*   Liver Function Tests: No results for input(s): AST, ALT, ALKPHOS, BILITOT, PROT, ALBUMIN in the last 168 hours. No results for input(s): LIPASE, AMYLASE in the last 168 hours. No results for input(s): AMMONIA in the last 168 hours. CBC:  Recent Labs Lab 07/31/14 0458 08/01/14 1137 08/02/14 0415 08/03/14 0318 08/04/14 0352  WBC 18.3* 22.0* 14.1* 11.8* 11.8*  HGB 9.5* 9.7* 9.3* 9.5* 9.9*  HCT 27.7* 29.2* 27.4* 28.3* 29.8*  MCV 91.1 95.4 94.8 93.4 94.0  PLT 362 390 384 412* 440*   Cardiac Enzymes: No results for input(s): CKTOTAL, CKMB, CKMBINDEX, TROPONINI in the last 168 hours. BNP (last 3 results)  Recent Labs  02/24/14 1606 02/26/14 1623 03/06/14 1129  PROBNP 2973.0* 18948.0* 5943.0*   CBG:  No results for input(s): GLUCAP in the last 168 hours.  Recent Results (from the past 240 hour(s))  Culture, expectorated sputum-assessment     Status: None   Collection Time: 07/25/14  3:42 PM  Result Value Ref Range Status   Specimen Description SPUTUM  Final   Special Requests Normal  Final   Sputum evaluation   Final    THIS SPECIMEN IS ACCEPTABLE. RESPIRATORY CULTURE REPORT TO FOLLOW.   Report Status 07/25/2014 FINAL  Final  Culture, respiratory (NON-Expectorated)     Status: None   Collection Time: 07/25/14  3:42 PM  Result Value Ref Range Status   Specimen Description SPUTUM  Final   Special Requests NONE  Final   Gram Stain   Final    RARE WBC PRESENT, PREDOMINANTLY PMN RARE SQUAMOUS EPITHELIAL CELLS PRESENT RARE GRAM POSITIVE COCCI IN PAIRS Performed  at [facility]    Culture   Final    NORMAL OROPHARYNGEAL FLORA Performed at [facility]    Report Status 07/31/2014 FINAL  Final  Respiratory virus panel     Status: None   Collection Time: 07/25/14  8:03 PM  Result Value Ref Range Status   Respiratory Syncytial Virus A Negative Negative Final   Respiratory Syncytial Virus B Negative Negative Final   Influenza A Negative Negative Final   Influenza B Negative Negative Final   Parainfluenza 1 Negative Negative Final   Parainfluenza 2 Negative Negative Final   Parainfluenza 3 Negative Negative Final   Metapneumovirus Negative Negative Final   Rhinovirus Negative Negative Final   Adenovirus Negative Negative Final    Comment: (NOTE) Performed At: [facility] [address] [place], [place] [id number] Mila Homer MD WU:[id number]   Clostridium Difficile by PCR     Status: None   Collection Time: 07/30/14 12:51 PM  Result Value Ref Range Status   C difficile by pcr NEGATIVE NEGATIVE Final         Studies: Dg Chest 2 View  08/04/2014   CLINICAL DATA:  Short of breath. Weakness. History of pulmonary fibrosis.  EXAM: CHEST  2 VIEW  COMPARISON:  08/01/2014.  FINDINGS: Changes from cardiac surgery are stable. Cardiac silhouette is mildly enlarged. No mediastinal or hilar masses.  Left upper lobe airspace opacity noted previously has improved.  Areas of coarse reticular type opacity in both lungs and at both lung bases is stable consistent with scarring.  No pulmonary edema. Minimal left pleural effusions suggested. No pneumothorax.  IMPRESSION: 1. Improved left upper lobe pneumonia. 2. No new abnormalities.  Persistent areas of lung scarring.   Electronically Signed   By: Amie Portland M.D.   On: 08/04/2014 10:10   Ct Head Wo Contrast  08/04/2014   CLINICAL DATA:  Episode yesterday of difficulty word finding, left facial on left arm weakness. This was followed by right upper extremity weakness and  paresthesias.  EXAM: CT HEAD WITHOUT CONTRAST  TECHNIQUE: Contiguous axial images were obtained from the base of the skull through the vertex without intravenous contrast.  COMPARISON:  CT 08/02/2014, MR 03/26/2013.  FINDINGS: There is no intracranial hemorrhage or evidence of acute infarction. There is no extra-axial fluid collection. There is encephalomalacia due to remote infarction in the right occipital, right frontoparietal and left posterior frontal regions, these remote infarctions are unchanged from the prior MRI. No new infarction is evident. There is no mass, mass effect or midline shift. There is moderate generalized atrophy. No significant bony abnormality is evident. No  interval change is evident from yesterday's CT.  IMPRESSION: Remote infarctions as detailed above. No evidence of new infarct or of infarct extension. Moderate chronic atrophy.   Electronically Signed   By: Ellery Plunk M.D.   On: 08/04/2014 02:24        Scheduled Meds: . allopurinol  200 mg Oral Daily  . atorvastatin  40 mg Oral QPM  . carvedilol  6.25 mg Oral BID WC  . clopidogrel  75 mg Oral Daily  . DULoxetine  60 mg Oral Daily  . hydrALAZINE  12.5 mg Oral BID  . isosorbide mononitrate  30 mg Oral Daily  . multivitamin with minerals  1 tablet Oral Daily  . pantoprazole  80 mg Oral Daily  . predniSONE  40 mg Oral Q breakfast  . sodium chloride  3 mL Intravenous Q12H  . tamsulosin  0.8 mg Oral QHS  . torsemide  20 mg Oral QPM  . torsemide  40 mg Oral Q breakfast  . warfarin  5 mg Oral ONCE-1800  . Warfarin - Pharmacist Dosing Inpatient   Does not apply q1800   Continuous Infusions: . heparin 950 Units/hr (08/04/14 1036)    Principal Problem:   Acute respiratory failure Active Problems:   TIA 2007, Dec 2014 (Plavix added)   Coronary artery disease-moderate Oct 2015   CKD (chronic kidney disease), stage III   Moderate to Severe Mitral Regurgitation   PAD- LLE PTA, RCEA, attempted LICA stent    Tissue AVR 2006 (in [place])   NICM (nonischemic cardiomyopathy)   PAF   Hyperlipidemia   Chronic anticoagulation   Pacemaker- MDT BiV 02/17/14   Acute on chronic systolic CHF (congestive heart failure)   Chronic systolic heart failure   Acute renal failure superimposed on stage 3 chronic kidney disease   Pneumonia, organism unspecified   Acute respiratory failure with hypoxia   Abnormal CT scan, chest   Diarrhea   Amiodarone pulmonary toxicity   Aphasia   Carotid stenosis   S/P carotid endarterectomy   Pulmonary infiltrates    Time spent: 20 minutes    Ovida Delagarza, MD, FACP, FHM. [facility] Pager [phone]  If 7PM-7AM, please contact night-coverage [email or web address] Password [email or web address] 08/04/2014, 2:04 PM    LOS: 15 days

## 2014-08-04 NOTE — Progress Notes (Addendum)
SATURATION QUALIFICATIONS: (This note is used to comply with regulatory documentation for home oxygen)  Patient Saturations on Room Air at Rest = 88%  Patient Saturations on 4  Liters of oxygen while Ambulating = >90%  Please briefly explain why patient needs home oxygen:Pt desats on RA at rest and with ambulation unless on 4LO2. Thanks [employer] [facility] [phone] (pager)

## 2014-08-04 NOTE — Progress Notes (Signed)
*  PRELIMINARY RESULTS* Vascular Ultrasound Carotid Duplex (Doppler) has been completed.  Preliminary findings: Right = 1-39% ICA stenosis. Left = 40-59% ICA stenosis, based on diastolic velocity, however, based on systolic velocity and ratio, the range is 60-79%.    Farrel Demark, RDMS, RVT  08/04/2014, 3:44 PM

## 2014-08-05 ENCOUNTER — Inpatient Hospital Stay (HOSPITAL_COMMUNITY): Payer: Medicare HMO

## 2014-08-05 DIAGNOSIS — Z954 Presence of other heart-valve replacement: Secondary | ICD-10-CM

## 2014-08-05 DIAGNOSIS — R29898 Other symptoms and signs involving the musculoskeletal system: Secondary | ICD-10-CM

## 2014-08-05 LAB — BASIC METABOLIC PANEL
ANION GAP: 11 (ref 5–15)
BUN: 68 mg/dL — ABNORMAL HIGH (ref 6–20)
CALCIUM: 8.7 mg/dL — AB (ref 8.9–10.3)
CO2: 33 mmol/L — ABNORMAL HIGH (ref 22–32)
CREATININE: 2 mg/dL — AB (ref 0.61–1.24)
Chloride: 93 mmol/L — ABNORMAL LOW (ref 101–111)
GFR calc Af Amer: 34 mL/min — ABNORMAL LOW (ref >60)
GFR calc non Af Amer: 30 mL/min — ABNORMAL LOW (ref >60)
Glucose, Bld: 167 mg/dL — ABNORMAL HIGH (ref 70–99)
Potassium: 3.7 mmol/L (ref 3.5–5.1)
Sodium: 137 mmol/L (ref 135–145)

## 2014-08-05 LAB — CBC
HCT: 28.9 % — ABNORMAL LOW (ref 39.0–52.0)
Hemoglobin: 9.7 g/dL — ABNORMAL LOW (ref 13.0–17.0)
MCH: 31.3 pg (ref 26.0–34.0)
MCHC: 33.6 g/dL (ref 30.0–36.0)
MCV: 93.2 fL (ref 78.0–100.0)
PLATELETS: 415 10*3/uL — AB (ref 150–400)
RBC: 3.1 MIL/uL — AB (ref 4.22–5.81)
RDW: 17.9 % — ABNORMAL HIGH (ref 11.5–15.5)
WBC: 12 10*3/uL — AB (ref 4.0–10.5)

## 2014-08-05 LAB — HEPARIN LEVEL (UNFRACTIONATED): Heparin Unfractionated: 0.54 IU/mL (ref 0.30–0.70)

## 2014-08-05 LAB — PROTIME-INR
INR: 1.59 — AB (ref 0.00–1.49)
Prothrombin Time: 19.1 seconds — ABNORMAL HIGH (ref 11.6–15.2)

## 2014-08-05 MED ORDER — ENSURE ENLIVE PO LIQD
237.0000 mL | ORAL | Status: DC
  Administered 2014-08-05: 237 mL via ORAL

## 2014-08-05 MED ORDER — WARFARIN SODIUM 5 MG PO TABS
5.0000 mg | ORAL_TABLET | Freq: Once | ORAL | Status: AC
  Administered 2014-08-05: 5 mg via ORAL
  Filled 2014-08-05: qty 1

## 2014-08-05 MED ORDER — SODIUM CHLORIDE 0.9 % IV SOLN
INTRAVENOUS | Status: DC
  Administered 2014-08-05 – 2014-08-06 (×2): via INTRAVENOUS

## 2014-08-05 NOTE — Progress Notes (Signed)
Patient transferred from bed to chair with using a walker and one assist. Transfers was very unsteady. O2 sats dropped just from moving from bed to chair in the room.

## 2014-08-05 NOTE — Progress Notes (Addendum)
Was called by nurse that pt had episode around 1pm with confusion, difficulty speech and left arm and leg weakness. The episode lasted about 15-78min resolved. BP at that time 110/35. By the time I was seeing him, he was sitting in chair and seems back to baseline. However, during the conversation, he started to have another episode with slurry speech, mild left facial droop, left arm weakness with drift and mild left leg weakness. BP at that time 112/30. We transferred pt to bed and heparin on hold. Stat CT and EEG ordered. While stay in bed, pt symptoms starting to resolve and at about out, he was again back to his baseline and neuro exam back to baseline. CT head did not show acute abnormalities. Will continue with stat EEG. Discussed with Dr. Shirlee Latch that will hold off some of his BP meds for acute ischemic episodes.   Marvel Plan, MD PhD Stroke Neurology 08/05/2014 3:12 PM

## 2014-08-05 NOTE — Procedures (Signed)
ELECTROENCEPHALOGRAM REPORT  Patient: Timothy Durham       Room #: 3E21 EEG No. ID: [id number] Age: 81 y.o.        Sex: male Referring Physician: Waymon Amato, A Report Date:  08/05/2014        Interpreting Physician: Aline Brochure  History: [name] is an 81 y.o. male 81 year old man with recurrent spells of transient confusion, speech difficulty and left arm and leg weakness. Repeat CT scans head have not shown a significant abnormality. Patient has a pacemaker and cannot undergo MRI evaluation.  Indications for study:  Rule out focal seizure disorder.  Technique: This is an 18 channel routine scalp EEG performed at the bedside with bipolar and monopolar montages arranged in accordance to the international 10/20 system of electrode placement.   Description: This EEG recording was performed during wakefulness. Predominant background activity consisted of 8-9 Hz symmetrical alpha rhythm which attenuated well with eye opening. Photic stimulation and hyperventilation were not performed. No epileptiform discharges were recorded. There was no abnormal slowing of cerebral activity.  Interpretation: This is a normal EEG recording during wakefulness. No evidence of an epileptic disorder was demonstrated. The absence of epileptiform activity during EEG recording does not rule out seizure disorder, however.   Venetia Maxon M.D. [facility] [phone]

## 2014-08-05 NOTE — Progress Notes (Signed)
Called to room by CNA. Patient confuse, unable to speak, left hand weakness noted. Md hongalgi paged. Will page Neurologist.

## 2014-08-05 NOTE — Progress Notes (Signed)
FOLLOW-UP NUTRITION ASSESSMENT  DOCUMENTATION CODES Per approved criteria  -Non-severe (moderate) malnutrition in the context of chronic illness   INTERVENTION: Continue Multivitamin with minerals daily Provide snacks BID Provide Ensure Enlive po BID, each supplement provides 350 kcal and 20 grams of protein Encourage PO intake  NUTRITION DIAGNOSIS: Increased protein needs related to acute illness as evidenced by having PNA/WBC 19.8; ongoing  Goal: Pt to meet >/= 90% of their estimated nutrition needs; not met  Monitor:  Oral intake, fluid status, labs  Admitting Dx: Acute respiratory failure  ASSESSMENT: 81 year old male with history of nonischemic cardiomyopathy, NSTEMI,, CAD, CKD, stroke, COPD who presented with progressive SOB for the past 3 days. chest x-ray showing multifocal pneumonia and volume overload.  Pt's weight has dropped an additional 7 lbs in the past 5 days. Pt states he continues to have a good appetite and is eating 100% of most meals; double portions of meats has been too much food though. Pt has been getting one snack daily. Pt reports feeling very weak. He is agreeable to starting Ensure supplement to prevent further weight loss.     Labs: low hemoglobin, low calcium, low GFR, low albumin  Height: Ht Readings from Last 1 Encounters:  07/20/14 _0  (1.778 m)    Weight: Wt Readings from Last 1 Encounters:  08/05/14 141 lb 3.2 oz (64.048 kg)   07/31/14 148 lb 8 oz (67.359 kg)        Ideal Body Weight: 166 lbs  BMI:  Body mass index is 20.26 kg/(m^2).  Estimated Nutritional Needs: Kcal: 1850-1950  Protein: 80-95 grams Fluid: 1.8 L/day  Skin: +2 RLE and +1 LLE edema per nursing notes  Diet Order: Diet Heart Room service appropriate?: Yes; Fluid consistency:: Thin    Intake/Output Summary (Last 24 hours) at 08/05/14 1316 Last data filed at 08/05/14 1109  Gross per 24 hour  Intake 1182.3 ml  Output   1700 ml  Net -517.7 ml    Last  BM: 5/9  Continuous Infusions: . sodium chloride    . heparin 950 Units/hr (08/05/14 1000)    Pryor Ochoa RD, LDN Inpatient Clinical Dietitian Pager: [phone] After Hours Pager: [phone]

## 2014-08-05 NOTE — Progress Notes (Signed)
Patient ID: Timothy Durham, male   DOB: 29-Nov-1930, 81 y.o.   MRN: [id number]   Timothy Durham is an 81 yo male with a history of prior CVA in 2000 secondary to PAF, nonischemic cardimoyopathy s/p [employer] CRT-P (02/17/14), chronic systolic HF (EF 30-35%), non-obstructive CAD, HTN, CKD stage IV, HLD, PAD and bioprosthetic AVR.   Followed closely in HF clinic and recently doing quite well. Diuretics had to be cut back a bit due to hypotension. Weight has been stable around 154 pounds.   Admitted 4/24 ago with worsening SOB with fevers and chills with WBC 12.2 and left shift. Weight stable. Initial CXR with mild vascular congestion. Felt to have acute bronchitis. However dyspnea got worse and CXR 4/26 showed worsening bilateral infiltrates ? HF vs multifocal PNA. IV Lasix and abx initially, has completed antibiotic course.  Steroids increased.   On 5/7, developed dysarthria and left arm weakness.  CT head with no acute changes, unable to do MRI with CRT-P device.  Symptoms totally resolved, TIA.  Neurology following.    Gradual improvement, walking more. CXR looking better.   CT chest with diffuse, bilateral consolidated airspace disease.    Barium swallow with "nonspecific esophageal dysmotility disorder."   Seen by vascular, carotid dopplers with < 40% stenosis at right CEA site, 40-60% stenosis on left.  The left-sided weakness with his TIAs does not correspond to his left carotid.   This morning having intermittent left sided weakness and difficulty speaking - lasting 15 minutes followed by another episode with neurologist in the room.  Confused did not know his wife. This has resolved.   Scheduled Meds: . allopurinol  200 mg Oral Daily  . atorvastatin  40 mg Oral QPM  . carvedilol  6.25 mg Oral BID WC  . clopidogrel  75 mg Oral Daily  . DULoxetine  60 mg Oral Daily  . hydrALAZINE  12.5 mg Oral BID  . isosorbide mononitrate  30 mg Oral Daily  . multivitamin with minerals  1 tablet Oral Daily    . pantoprazole  80 mg Oral Daily  . predniSONE  40 mg Oral Q breakfast  . sodium chloride  3 mL Intravenous Q12H  . tamsulosin  0.8 mg Oral QHS  . torsemide  20 mg Oral QPM  . torsemide  40 mg Oral Q breakfast  . warfarin  5 mg Oral ONCE-1800  . Warfarin - Pharmacist Dosing Inpatient   Does not apply q1800   Continuous Infusions: . sodium chloride    . heparin 950 Units/hr (08/05/14 1000)   PRN Meds:.acetaminophen, albuterol   Filed Vitals:   08/05/14 1032 08/05/14 1033 08/05/14 1247 08/05/14 1315  BP:   110/35 112/30  Pulse:   64 76  Temp:      TempSrc:      Resp:   18 18  Height:      Weight:      SpO2: 88% 83% 97% 94%    Intake/Output Summary (Last 24 hours) at 08/05/14 1320 Last data filed at 08/05/14 1109  Gross per 24 hour  Intake 1182.3 ml  Output   1700 ml  Net -517.7 ml    LABS: Basic Metabolic Panel:  Recent Labs  74/25/95 0352 08/05/14 0505  NA 137 137  K 3.9 3.7  CL 92* 93*  CO2 36* 33*  GLUCOSE 97 167*  BUN 69* 68*  CREATININE 1.81* 2.00*  CALCIUM 8.8* 8.7*   Liver Function Tests: No results for input(s): AST, ALT, ALKPHOS, BILITOT,  PROT, ALBUMIN in the last 72 hours. No results for input(s): LIPASE, AMYLASE in the last 72 hours. CBC:  Recent Labs  08/04/14 0352 08/05/14 0505  WBC 11.8* 12.0*  HGB 9.9* 9.7*  HCT 29.8* 28.9*  MCV 94.0 93.2  PLT 440* 415*   Cardiac Enzymes: No results for input(s): CKTOTAL, CKMB, CKMBINDEX, TROPONINI in the last 72 hours. BNP: Invalid input(s): POCBNP D-Dimer: No results for input(s): DDIMER in the last 72 hours. Hemoglobin A1C:  Recent Labs  08/03/14 1630  HGBA1C 6.2*   Fasting Lipid Panel:  Recent Labs  08/03/14 1630  CHOL 147  HDL 54  LDLCALC 80  TRIG 63  CHOLHDL 2.7   Thyroid Function Tests: No results for input(s): TSH, T4TOTAL, T3FREE, THYROIDAB in the last 72 hours.  Invalid input(s): FREET3 Anemia Panel: No results for input(s): VITAMINB12, FOLATE, FERRITIN, TIBC,  IRON, RETICCTPCT in the last 72 hours.  RADIOLOGY: Dg Chest 2 View  08/04/2014   CLINICAL DATA:  Short of breath. Weakness. History of pulmonary fibrosis.  EXAM: CHEST  2 VIEW  COMPARISON:  08/01/2014.  FINDINGS: Changes from cardiac surgery are stable. Cardiac silhouette is mildly enlarged. No mediastinal or hilar masses.  Left upper lobe airspace opacity noted previously has improved.  Areas of coarse reticular type opacity in both lungs and at both lung bases is stable consistent with scarring.  No pulmonary edema. Minimal left pleural effusions suggested. No pneumothorax.  IMPRESSION: 1. Improved left upper lobe pneumonia. 2. No new abnormalities.  Persistent areas of lung scarring.   Electronically Signed   By: Amie Portland M.D.   On: 08/04/2014 10:10   Dg Chest 2 View  07/31/2014   CLINICAL DATA:  Acute respiratory failure with hypoxia. Pt hx COPD, CAD  EXAM: CHEST  2 VIEW  COMPARISON:  07/30/2014  FINDINGS: Patchy areas of airspace and interstitial opacities in the lungs are stable from most recent prior exam. No new lung abnormalities. No significant pleural effusion and no pneumothorax.  Changes from cardiac surgery and aortic valve replacement are stable. Cardiac silhouette is mildly enlarged. No mediastinal or hilar masses. Left anterior chest wall sequential pacemaker is stable and well positioned.  IMPRESSION: No change from the previous day's study. Bilateral patchy airspace and interstitial lung opacities are stable. No new abnormalities.   Electronically Signed   By: Amie Portland M.D.   On: 07/31/2014 13:02   Dg Chest 2 View  07/30/2014   CLINICAL DATA:  Hypoxia.  Weakness.  EXAM: CHEST  2 VIEW  COMPARISON:  07/28/2014.  FINDINGS: There is improvement from 07/28/2014, with partial clearance of the multifocal bilateral airspace opacities although significant opacities persist, left greater than right. Mild right lateral base pleural thickening persists. No significant effusion is evident.  There is moderate unchanged cardiomegaly. There is prior sternotomy and aortic valvuloplasty. There are intact appearances of the transvenous leads.  IMPRESSION: Slight improvement. Multifocal bilateral airspace opacities persist, partially cleared.   Electronically Signed   By: Ellery Plunk M.D.   On: 07/30/2014 06:50   Dg Chest 2 View  07/28/2014   CLINICAL DATA:  Respiratory failure .  EXAM: CHEST  2 VIEW  COMPARISON:  07/22/2014 .  CT 07/25/2014.  FINDINGS: Mediastinum and hilar structures are normal. Prior cardiac valve replacement. Cardiac pacer noted with lead tips in stable position. Persistent multifocal bilateral pulmonary infiltrates, no interim change. Small right pleural effusion. No pneumothorax.  IMPRESSION: 1. Persistent multifocal bilateral pulmonary infiltrates most consistent with pneumonia. No significant  interim change.  2. Prior cardiac valve replacement. Cardiac pacer stable position. Stable cardiomegaly .   Electronically Signed   By: Maisie Fus  Register   On: 07/28/2014 08:33   Dg Chest 2 View  07/20/2014   CLINICAL DATA:  Shortness of breath.  Pacemaker.  EXAM: CHEST  2 VIEW  COMPARISON:  05/04/2014.  FINDINGS: Mildly increased cardiac silhouette. Triple lead pacer unchanged from priors. Moderate vascular congestion without overt failure or significant consolidation. Mild to moderate pleural effusions, greater on the RIGHT. Aortic valvular placement with prior median sternotomy. No acute osseous findings.  IMPRESSION: Cardiomegaly with stable appearance of the transvenous pacer.  Moderate vascular congestion without overt failure. Overall improved from prior radiograph in February.   Electronically Signed   By: Davonna Belling M.D.   On: 07/20/2014 02:23   Ct Head Wo Contrast  08/04/2014   CLINICAL DATA:  Episode yesterday of difficulty word finding, left facial on left arm weakness. This was followed by right upper extremity weakness and paresthesias.  EXAM: CT HEAD WITHOUT CONTRAST   TECHNIQUE: Contiguous axial images were obtained from the base of the skull through the vertex without intravenous contrast.  COMPARISON:  CT 08/02/2014, MR 03/26/2013.  FINDINGS: There is no intracranial hemorrhage or evidence of acute infarction. There is no extra-axial fluid collection. There is encephalomalacia due to remote infarction in the right occipital, right frontoparietal and left posterior frontal regions, these remote infarctions are unchanged from the prior MRI. No new infarction is evident. There is no mass, mass effect or midline shift. There is moderate generalized atrophy. No significant bony abnormality is evident. No interval change is evident from yesterday's CT.  IMPRESSION: Remote infarctions as detailed above. No evidence of new infarct or of infarct extension. Moderate chronic atrophy.   Electronically Signed   By: Ellery Plunk M.D.   On: 08/04/2014 02:24   Ct Head Wo Contrast  08/02/2014   CLINICAL DATA:  Right facial droop.  History of stroke.  EXAM: CT HEAD WITHOUT CONTRAST  TECHNIQUE: Contiguous axial images were obtained from the base of the skull through the vertex without intravenous contrast.  COMPARISON:  Head CT dated 03/25/2013 and brain MR dated 03/26/2013.  FINDINGS: Previously demonstrated in bold right occipital, right frontoparietal and small left posterior frontal infarcts are unchanged. Diffusely enlarged ventricles and subarachnoid spaces. No intracranial hemorrhage, mass lesion or CT evidence of acute infarction. Unremarkable bones and included paranasal sinuses.  IMPRESSION: 1. No acute abnormality. 2. Stable old infarcts, as described above. 3. Stable mild to moderate diffuse cerebral atrophy. These results were called by telephone at the time of interpretation on 08/02/2014 at 9:32 am to Dr. Thad Ranger, who verbally acknowledged these results.   Electronically Signed   By: Beckie Salts M.D.   On: 08/02/2014 09:32   Ct Chest Wo Contrast  07/25/2014   CLINICAL  DATA:  Hypoxia and progressive shortness of breath for the past 3 days. Nonproductive cough and wheeze  EXAM: CT CHEST WITHOUT CONTRAST  TECHNIQUE: Multidetector CT imaging of the chest was performed following the standard protocol without IV contrast.  COMPARISON:  None.  FINDINGS: THORACIC INLET/BODY WALL:  There is a biventricular pacer from the left with leads in unremarkable position. No acute findings at the thoracic inlet.  There is an incidental 1 cm intramuscular lipoma in the right chest wall.  MEDIASTINUM:  Cardiomegaly which is chronic based on chest x-rays. No pericardial effusion. The patient is status post median sternotomy with aortic valve replacement.  The ascending aorta measures 41 mm in diameter. There is mild enlargement of mediastinal lymph nodes which could be reactive to the presumed pneumonia or sequela of remote granulomatous disease (there is also scattered nodal coarse calcification). No acute vascular findings. Negative esophagus.  LUNG WINDOWS:  There is patchy consolidative and ground-glass opacities in all lobes. Airspace disease is most extensive in the left upper lobe and most dense in the posterior segment right upper lobe. Small bilateral pleural effusions with interlobular septal thickening. No cavitation. No evidence of loculated effusion.  UPPER ABDOMEN:  Granulomatous changes in the liver and spleen.  OSSEOUS:  No acute findings.  IMPRESSION: 1. Multilobar pneumonia. 2. Superimposed interstitial edema and small pleural effusions. 3. Chronic and postoperative findings noted above.   Electronically Signed   By: Marnee Spring M.D.   On: 07/25/2014 08:00   Dg Esophagus  07/29/2014   CLINICAL DATA:  Dysphagia.  Choking.  Bilateral pneumonia.  EXAM: ESOPHOGRAM/BARIUM SWALLOW  TECHNIQUE: Combined double contrast and single contrast examination performed using effervescent crystals, thick barium liquid, and thin barium liquid.  FLUOROSCOPY TIME:  Radiation Exposure Index (as  provided by the fluoroscopic device): Dose area product: 829.79uGy*m^2  COMPARISON:  Radiographs 07/28/2014.  Chest CT 07/25/2014.  FINDINGS: Pharyngeal phase of swallowing was normal. The study was carried out in the AP projection due to patient condition. Nonspecific esophageal dysmotility was present without stricture. 0/3 primary waves reach the gastroesophageal junction. Tertiary contractions were observed. Fetal on esophagus incidentally noted.  There is no bear ear passage of the 13 mm barium tablet challenge however the tablet remain static in the lower esophagus just proximal to the gastroesophageal junction for several min. No gastroesophageal reflux could be elicited with water siphon test.  Esophageal stasis was present in the supine position. Esophageal folds appeared within normal limits.  IMPRESSION: 1. No esophageal stricture or mass. 2. Nonspecific esophageal dysmotility disorder with with tertiary contractions. Stasis in the supine position.   Electronically Signed   By: Andreas Newport M.D.   On: 07/29/2014 09:39   US Renal  07/20/2014   CLINICAL DATA:  Acute kidney injury.  Hypertension.  EXAM: RENAL/URINARY TRACT ULTRASOUND COMPLETE  COMPARISON:  None.  FINDINGS: Right Kidney:  Length: 10.3 cm. Echogenicity within normal limits. No mass or hydronephrosis visualized. Diffuse cortical thinning with prominent renal sinus fat.  Left Kidney:  Length: 11.1 cm. Echogenicity within normal limits. No mass or hydronephrosis visualized. Diffuse cortical thinning with prominent renal sinus fat.  Bladder:  Multiple diverticula. The largest measures 4.9 cm in maximum diameter. Small amount of internal debris with no definite calculi  IMPRESSION: 1. Diffuse bilateral renal cortical atrophy. 2. Multiple bladder diverticula. 3. Small amount of debris in the bladder with no definite calculi.   Electronically Signed   By: Beckie Salts M.D.   On: 07/20/2014 12:27   Dg Chest Port 1 View  08/01/2014   CLINICAL  DATA:  81 year old male with a history of pulmonary infiltrates and shortness of breath.  EXAM: PORTABLE CHEST - 1 VIEW  COMPARISON:  Multiple prior, most recent 07/31/2014, 07/30/2014  FINDINGS: Cardiomediastinal silhouette unchanged.  Atherosclerosis.  Unchanged cardiac pacing device with 3 leads in place.  Bilateral airspace disease, involving the right greater than left bases and the left greater than right apices. Distribution is similar to comparison plain film. Over the course of several chest x-ray this has improved, dating to 07/22/2014 and 07/20/2014.  Trace pleural effusions.  Calcified right hilar lymph nodes.  IMPRESSION: Persisting bilateral airspace disease, which is unchanged since the most recent plain films though has improved since the comparisons of 07/20/2014.  Persisting small pleural effusions.  Signed,  Yvone Neu. Loreta Ave, DO  [facility]  [facility]   Electronically Signed   By: Gilmer Mor D.O.   On: 08/01/2014 07:19   Dg Chest Port 1 View  07/22/2014   CLINICAL DATA:  Acute onset of hypoxemia.  Initial encounter.  EXAM: PORTABLE CHEST - 1 VIEW  COMPARISON:  Chest radiograph performed 07/20/2014  FINDINGS: The lungs are well-aerated. Worsening bilateral midlung airspace opacification raises concern for multifocal pneumonia. Small bilateral pleural effusions are seen, and underlying interstitial edema cannot be excluded. Vascular congestion is noted. No pneumothorax is seen.  The cardiomediastinal silhouette is mildly enlarged. The patient is status post median sternotomy. A pacemaker is noted overlying the left chest wall, with leads ending overlying the right atrium and right ventricle. No acute osseous abnormalities are seen.  IMPRESSION: 1. Worsening bilateral mid lung airspace opacification raises concern for multifocal pneumonia. 2. Small bilateral pleural effusions, with vascular congestion and mild cardiomegaly. Underlying  interstitial edema cannot be excluded.   Electronically Signed   By: Roanna Raider M.D.   On: 07/22/2014 04:30    PHYSICAL EXAM General: NAD. Sitting in chair Neck: JVP  8 cm, no thyromegaly or thyroid nodule.  Lungs: + crackles, decreased breath sounds at bases bilaterally.   CV: Nondisplaced PMI.  Heart regular S1/S2, no S3/S4, 2/6 HSM LLSB/apex. 1+ edema right ankle none left. Abdomen: Soft, nontender, no hepatosplenomegaly, no distention.  Neurologic: Alert and oriented x 3.  Psych: Normal affect. Extremities: No clubbing or cyanosis.   TELEMETRY: Reviewed telemetry pt in NSR with BiV pacing    Assessment:   1. 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  2. Acute on chronic systolic heart failure: EF 30-35% by last echo.  3. Acute bronchitis 4. CKD, stage III-IV 5. PAF in NSR on amio - CHDSVASC 5. On warfarin 6. Multifocal airspace disease: ?amiodarone toxicity versus hypersensitivity pneumonitis vs aspiration PNA.   7. TIA 5/7, again 5/10.  History of right CEA and left tight carotid stenosis, unable to do carotid stent in past.  However, was evaluated by vascular here and had another carotid doppler showing < 40% stenosis at right CEA site, 40-60% stenosis on left.  The left-sided weakness with his TIAs does not correspond to his left carotid.   Plan/Discussion:   Will need to hold HF meds with new onset left sided weakness at that request of neurology. Volume status ok. Creatinine up a little hold torsemide. Hold carvedilol, hydralazine, and imdur. No ACEI with CKD.   Workup of airspace disease per pulmonary.  He is now off amiodarone for ?lung toxicity.  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  Prednisone dosing per pulmonary.    TIA on 5/7 and again 5/10. He has PAF but has been on heparin gtt while INR subtherapeutic.  He had a tight  left carotid stenosis historically, but as above was seen by vascular and had carotids with 40-60% LICA stenosis that does not correspond with left-sided weakness.  He is now on heparin/coumadin and Plavix.  Today he had intermittent left sided weakness and confusion 15 minutes with another episode. Neurology in to see. ? Seizures Stat CT and EEG.  - Off  heparin gtt  for now.     CLEGG,AMY NP-C  08/05/2014 1:20 PM      Patient seen with NP, agree with the above note.   Recurrent TIA x 2.  Now resolved.  To get stat CT again, heparin held for now.  If no bleeding, resume heparin.  To get EEG r/o seizures.  Will try to run BP higher, hold Coreg, hydralazine, Imdur for now.  Can restart Coreg at lower dose, likely leave off hydralazine.   Creatinine higher, hold torsemide today, likely restart tomorrow.   CXR yesterday with improved LUL PNA.  Need guidance for pulmonary regarding steroid course.  He does seem to be breathing better.   Will need short term rehab at discharge.   Marca Ancona 08/05/2014 2:03 PM

## 2014-08-05 NOTE — Progress Notes (Signed)
ANTICOAGULATION CONSULT NOTE - Follow Up Consult  Pharmacy Consult for Heparin and Coumadin Indication: atrial fibrillation and CVA  Patient Measurements: Height: 5\' 10"  (177.8 cm) Weight: 141 lb 3.2 oz (64.048 kg) (scale B) IBW/kg (Calculated) : 73 Heparin Dosing Weight: 67 kg  Vital Signs: Temp: 98.1 F (36.7 C) (05/10 0606) Temp Source: Oral (05/10 0606) BP: 117/43 mmHg (05/10 0606) Pulse Rate: 77 (05/10 0606)  Labs:  Recent Labs  08/03/14 0318 08/04/14 0352 08/05/14 0505  HGB 9.5* 9.9* 9.7*  HCT 28.3* 29.8* 28.9*  PLT 412* 440* 415*  LABPROT 16.9* 17.2* 19.1*  INR 1.36 1.39 1.59*  HEPARINUNFRC 0.61 0.46 0.54  CREATININE 1.84* 1.81* 2.00*    Estimated Creatinine Clearance: 26.2 mL/min (by C-G formula based on Cr of 2).  Assessment:   81 yr old male on Coumadin prior to admission for PAF.    Home Coumadin regimen: 2 mg daily. INR 2.99 on admit 4/24.   Coumadin and Plavix held on 5/2 for possible bronchoscopy.   Heparin begun on 5/4 when INR down to 1.96.   Heparin level remains therapeutic today (0.54) on 950 units/hr.   INR = 1.59 today, slow trend up   Goal of Therapy:  INR 2-3 Heparin level 0.3-0.7 units/ml Monitor platelets by anticoagulation protocol: Yes   Plan:   Continue heparin drip at 950 units/hr.  Coumadin 5 mg po x 1 dose tonight  Continue daily heparin level, PT/INR and CBC.  Thank you. Okey Regal, PharmD [phone]  08/05/2014,9:51 AM

## 2014-08-05 NOTE — Progress Notes (Signed)
EEG completed, results pending. 

## 2014-08-05 NOTE — Progress Notes (Signed)
SATURATION QUALIFICATIONS: (This note is used to comply with regulatory documentation for home oxygen)  Patient Saturations on Room Air at Rest = 92%  Patient Saturations on Room Air while Ambulating = 83%  Patient Saturations on 2 Liters of oxygen while Ambulating = 90%  Please briefly explain why patient needs home oxygen:patient desats while ambulating

## 2014-08-05 NOTE — Progress Notes (Signed)
Patient sitting upright in recliner chair and is alert and oriented watching television. Neuro checks performed and is documented. Patient complains of no pain, or discomfort at this time. VSS. Will continue to monitor patient to end of shift.

## 2014-08-05 NOTE — Progress Notes (Signed)
STROKE TEAM PROGRESS NOTE   SUBJECTIVE (INTERVAL HISTORY) Wife is at the bedside.  Overall he feels his condition is completely resolved and stable. No recurrent symptoms. His INR still 1.59 today, on coumadin, plavix and heparin drip. CUS and TCD has been done. Vascular surgery recommended no intervention at this time.    OBJECTIVE Temp:  [97.5 F (36.4 C)-98.1 F (36.7 C)] 98.1 F (36.7 C) (05/10 1002) Pulse Rate:  [64-86] 64 (05/10 1247) Cardiac Rhythm:  [-] Ventricular paced (05/10 0945) Resp:  [18-20] 18 (05/10 1247) BP: (110-135)/(35-52) 110/35 mmHg (05/10 1247) SpO2:  [83 %-98 %] 97 % (05/10 1247) Weight:  [141 lb 3.2 oz (64.048 kg)] 141 lb 3.2 oz (64.048 kg) (05/10 0606)  No results for input(s): GLUCAP in the last 168 hours.  Recent Labs Lab 08/01/14 0333 08/02/14 0415 08/03/14 0318 08/04/14 0352 08/05/14 0505  NA 140 137 136 137 137  K 4.6 4.2 4.3 3.9 3.7  CL 95* 94* 93* 92* 93*  CO2 34* 35* 35* 36* 33*  GLUCOSE 106* 98 133* 97 167*  BUN 78* 68* 61* 69* 68*  CREATININE 2.02* 1.69* 1.84* 1.81* 2.00*  CALCIUM 9.3 8.8* 8.9 8.8* 8.7*   No results for input(s): AST, ALT, ALKPHOS, BILITOT, PROT, ALBUMIN in the last 168 hours.  Recent Labs Lab 08/01/14 1137 08/02/14 0415 08/03/14 0318 08/04/14 0352 08/05/14 0505  WBC 22.0* 14.1* 11.8* 11.8* 12.0*  HGB 9.7* 9.3* 9.5* 9.9* 9.7*  HCT 29.2* 27.4* 28.3* 29.8* 28.9*  MCV 95.4 94.8 93.4 94.0 93.2  PLT 390 384 412* 440* 415*   No results for input(s): CKTOTAL, CKMB, CKMBINDEX, TROPONINI in the last 168 hours.  Recent Labs  08/03/14 0318 08/04/14 0352 08/05/14 0505  LABPROT 16.9* 17.2* 19.1*  INR 1.36 1.39 1.59*   No results for input(s): COLORURINE, LABSPEC, PHURINE, GLUCOSEU, HGBUR, BILIRUBINUR, KETONESUR, PROTEINUR, UROBILINOGEN, NITRITE, LEUKOCYTESUR in the last 72 hours.  Invalid input(s): APPERANCEUR     Component Value Date/Time   CHOL 147 08/03/2014 1630   TRIG 63 08/03/2014 1630   HDL 54  08/03/2014 1630   CHOLHDL 2.7 08/03/2014 1630   VLDL 13 08/03/2014 1630   LDLCALC 80 08/03/2014 1630   Lab Results  Component Value Date   HGBA1C 6.2* 08/03/2014      Component Value Date/Time   LABOPIA NONE DETECTED 03/26/2013 1408   COCAINSCRNUR NONE DETECTED 03/26/2013 1408   LABBENZ NONE DETECTED 03/26/2013 1408   AMPHETMU NONE DETECTED 03/26/2013 1408   THCU NONE DETECTED 03/26/2013 1408   LABBARB NONE DETECTED 03/26/2013 1408    No results for input(s): ETH in the last 168 hours.  I have personally reviewed the radiological images below and agree with the radiology interpretations.  Ct Head Wo Contrast 08/04/14 - Remote infarctions as detailed above. No evidence of new infarct or of infarct extension. Moderate chronic atrophy.  08/02/2014   IMPRESSION: 1. No acute abnormality. 2. Stable old infarcts, as described above. 3. Stable mild to moderate diffuse cerebral atrophy. These results were called by telephone at the time of interpretation on 08/02/2014 at 9:32 am to Dr. Thad Ranger, who verbally acknowledged these results.    Dg Chest Port 1 View  08/01/2014   IMPRESSION: Persisting bilateral airspace disease, which is unchanged since the most recent plain films though has improved since the comparisons of 07/20/2014.  Persisting small pleural effusions.    Carotid Doppler  Right = 1-39% ICA stenosis. Left = 40-59% ICA stenosis, based on diastolic velocity, however, based  on systolic velocity and ratio, the range is 60-79%.  TCD pending report  2D Echocardiogram  - EF 25-30%, Global hypokinesis and inferior lateral akinesis; overall severely reduced LV function; grade 2 diastolic dysfunction with elevated LV filling pressure; bioprosthetic aortic valve; mild AI; moderate LAE; mild MR and TR; mildly elevated pulmonary pressure.  PHYSICAL EXAM  Temp:  [97.5 F (36.4 C)-98.1 F (36.7 C)] 98.1 F (36.7 C) (05/10 1002) Pulse Rate:  [64-86] 64 (05/10 1247) Resp:   [18-20] 18 (05/10 1247) BP: (110-135)/(35-52) 110/35 mmHg (05/10 1247) SpO2:  [83 %-98 %] 97 % (05/10 1247) Weight:  [141 lb 3.2 oz (64.048 kg)] 141 lb 3.2 oz (64.048 kg) (05/10 0606)  General - Well nourished, well developed, in no apparent distress.  Ophthalmologic - Sharp disc margins OU.  Cardiovascular - Regular rate and rhythm with no murmur.  Mental Status -  Level of arousal and orientation to time, place, and person were intact. Language including expression, naming, repetition, comprehension was assessed and found intact. Fund of Knowledge was assessed and was intact.  Cranial Nerves II - XII - II - Visual field intact OU. III, IV, VI - Extraocular movements intact. V - Facial sensation intact bilaterally. VII - Facial movement intact bilaterally. VIII - Hearing & vestibular intact bilaterally. X - Palate elevates symmetrically. XI - Chin turning & shoulder shrug intact bilaterally. XII - Tongue protrusion intact.  Motor Strength - The patient's strength was normal in all extremities and pronator drift was absent.  Bulk was normal and fasciculations were absent.   Motor Tone - Muscle tone was assessed at the neck and appendages and was normal.  Reflexes - The patient's reflexes were symmetrical in all extremities and he had no pathological reflexes.  Sensory - Light touch, temperature/pinprick were assessed and were symmetrical.    Coordination - The patient had normal movements in the hands with no ataxia or dysmetria.  Tremor was absent.  Gait and Station - deferred due to safety concerns.   ASSESSMENT/PLAN Mr. Carlester Days is a 81 y.o. male with history of CAD and the STEMI in 2015, HTN, CKD, HLD, CHF with EF 30-35%, s/p AVR and pacemaker, pAfib on Coumadin, PAD s/p stent on plavix, stroke in 2000, 2007, and a TIA in 2014, s/p right CEA with left body stenosis admitted for pneumonia and fluid overload. His Coumadin and Plavix was on hold for bronchoscope, but later  resumed Coumadin with IV heparin bridge. 08/02/14 morning, he went to bathroom, felt dizzy and fell, found to have difficulty getting words out, left facial and left arm weakness, episodic, wax and wane, seems also involving right hand weakness and numbness. Symptoms now resolved. Plavix resumed 08/03/14.  Stroke/TIA:  Seems to have both left and right side involvement, difficult for localization. His episode can certainly be due to paroxysmal A. fib, however he was on heparin drip. He also has carotid artery stenosis s/p right CEA, and the left ICA stenosis. Vascular surgery recommended no intervention.  MRI  MRA not able to perform due to pacer  CT showed old bilateral infarcts but no acute abnormality  repeat CT head no new infarct  Carotid Doppler - Left = 40-59% ICA stenosis, based on diastolic velocity, however, based on systolic velocity and ratio, the range is 60-79%.  2D Echo  EF 25-30%  LDL 80  HgbA1c 6.2`  TCD pending report  Heparin drip for VTE prophylaxis  Diet Heart Room service appropriate?: Yes; Fluid consistency:: Thin   clopidogrel  75 mg orally every day and warfarin prior to admission, now on clopidogrel 75 mg orally every day, warfarin and heparin  Patient counseled to be compliant with his antithrombotic medications  Ongoing aggressive stroke risk factor management  Paroxysmal A. Fib  On heparin drip  On Coumadin  INR subtherapeutic 1.36->1.39->1.59  On amiodarone  Carotid stenosis  S/p right CEA  Carotid Doppler  Left = 40-59% ICA stenosis, based on diastolic velocity, however, based on systolic velocity and ratio, the range is 60-79%.  Agree to resume Plavix  Vascular surgery recommended no intervention, repeat carotid Doppler in 1 year  Follow-up with Dr. Arbie Cookey  Avoid hypotension  CAD with STEMI in 2015  Cardiology is following  On Plavix and torsemide  EF 25-30%  Hypertension  Home meds:   Coreg, hydralazine, spironolactone,  torsemide Currently on Coreg, hydralazine, torsemide BP goal normotensive  Stable  Patient counseled to be compliant with his blood pressure medications  Hyperlipidemia  Home meds:  Lipitor 40   Currently on Lipitor 40  LDL 80, goal < 70  Continue statin at discharge  Other Stroke Risk Factors  Advanced age  Hx stroke/TIA in 2000, 2007, and 2014  Coronary artery disease  PAD s/p left femoral stenting  Other Active Problems  INR subtherapeutic  Chronic kidney disease  Anemia  Leukocytosis, improving  Other Pertinent History  S/p AVR  Pacemaker in place  Hospital day # 16  Neurology will sign off. Please call with questions. Thanks for the consult.   Marvel Plan, MD PhD Stroke Neurology 08/05/2014 12:49 PM    To contact Stroke Continuity provider, please refer to [email or web address]. After hours, contact General Neurology

## 2014-08-05 NOTE — Progress Notes (Signed)
PROGRESS NOTE    Timothy Durham ZJI:[id number] DOB: 1930/07/21 DOA: 07/19/2014 PCP: Dennis Bast, MD  HPI/Brief narrative 81 year old male with history of nonischemic cardiomyopathy with EF of 30-35% as per last echo, NSTEMI in 2015 with cardiac cath showing nonobstructive CAD, chronic kidney disease stage III, history of stroke in 2000, history of bioprosthetic aVR, paroxysmal A. fib on chronic Coumadin, tobacco use with? COPD who presented with progressive shortness of breath for the past 3 days associated with nonproductive cough and wheezing. He reports being compliant with his medications. Chest x-ray on admission showing moderate vascular congestion. Labs showed elevated BNP of 1400. Patient admitted for acute hypoxic respiratory failure. He underwent extensive evaluation by pulmonary, ID and cardiology. At this time his hypoxia is felt to be due to organizing pneumonia from amiodarone which has been discontinued. Started on slow prednisone taper with plans for outpatient follow-up. Hypoxia is improving. On 5/7, patient developed stroke/TIA like presentation and stroke service is consulting. DC home when INR therapeutic >2.  Assessment/Plan:  Acute hypoxic respiratory failure - Initially felt to be due to infectious etiology/pneumonia or bronchitis. - ID and pulmonary consulted - Underwent extensive evaluation and even bronchoscopy was considered - Eventually felt to be due to organizing pneumonia from amiodarone + CHF: Recommendations are to hold amiodarone indefinitely, slow prednisone taper, wean oxygen for >88%.  - Improving. Was saturating in the low 90s on room air at bedrest.  Non-ischemic cardiomyopathy/acute on chronic systolic CHF - Continue Coreg hydralazine, imdur and statin..  - Not on ACEi/ ARB due to CKD - Advanced heart failure team following - Torsemide resumed 5/3  - Euvolemic  Acute on chronic kidney disease stage III - Baseline creatinine around 1.8-2.  - [place] abd  shows medical renal ds.  - Creatinine had transiently worsened on IV Lasix - Now creatinine fluctuating in the 1.6-2 range. Monitor closely  History of CVA and peripheral vascular disease/new CVA versus TIA on 5/7 - PTA on Coumadin, Plavix and statin. Patient on both Coumadin and Plavix for A. fib (had TIA on Coumadin alone). - Both Coumadin and Plavix were held while bronchoscopy was being considered. - IV heparin was started when INR dropped to less than 2 - On 5/7, patient sustained a fall and had strokelike presentation. Neurology was consulted - Head CT 5/7 & 5/9 negative for acute stroke or bleeds. - Unable to do MRI brain secondary to patient having pacemaker. - Not candidate for TPA. - Likely had CVA or TIA secondary to PAF versus left ICA stenosis. Remains on IV heparin until Coumadin can become therapeutic. Plavix which was held for possible bronchoscopy was resumed on 5/8. - Stroke team follow-up appreciated. - Echo: LVEF 25-30 percent, LDL 80, A1c 6.2 - Cardiology consulting vascular surgery for evaluation of left ICA high-grade stenosis - Patient again had TIA like symptoms today. Neurology evaluated. Repeat CT without acute changes. Heparin had been transiently held. EEG without seizures. Cardiology titrating medications to allow higher BP.  Atrial fibrillation - Biventricular paced rhythm - Amiodarone discontinued 5/2 - Anticoagulation held and resumed as stated above - CHDSVASC 5  GERD Continue PPI  Malnutrition,  - low albumin, nutrition consult obtained.  BPH/urinary retention  - of >600 on 5/1, in and out cathx1, increased flomax.  Chronic anemia - Stable  Leukocytosis - Possibly related to steroids - Improved  Diarrhea - Resolved. C. difficile PCR negative.  Fall on 5/7 & 5/9 - No overt injuries. - CT head negative - On 5/9, patient  apparently was sitting at edge of bed and his spouse was helping him to bedside chair when he slid down the bed onto the  floor and states that he did not sustain any injuries. - Patient and family have been counseled extensively that he should not attempt to get out of bed without nursing assistance. He verbalizes understanding.  Carotid stenosis - Status post right CEA - Left high-grade ICA stenosis but failed stenting - Doppler carotid pending - Vascular surgery consultation appreciated: Do not recommend any further intervention at this time. Recommend repeating carotid duplex in 1 year for follow-up.  Essential hypertension - Controlled  Hyperlipidemia - Continue statins   Code Status: DNR Family Communication: Discussed at length with patient and spouse at bedside. Updated care and answered questions. Advised that SNF for short-term rehabilitation may be best option at discharge. They are considering. Disposition Plan: DC home when medically stable and INR >2  Consultants:  Advanced CHF team  PCCM  ID-signed off 5/2  Neurology/stroke service  Procedures:  None  Antibiotics:  Azithromycin- completed   Subjective: Seen this morning when he had no complaints. Subsequently reported to have 2 transient TIA like symptoms-discussed with stroke M.D.  Objective: Filed Vitals:   08/05/14 1032 08/05/14 1033 08/05/14 1247 08/05/14 1315  BP:   110/35 112/30  Pulse:   64 76  Temp:      TempSrc:      Resp:   18 18  Height:      Weight:      SpO2: 88% 83% 97% 94%    Intake/Output Summary (Last 24 hours) at 08/05/14 1834 Last data filed at 08/05/14 1700  Gross per 24 hour  Intake    992 ml  Output   1675 ml  Net   -683 ml   Filed Weights   08/03/14 0300 08/04/14 0435 08/05/14 0606  Weight: 67.45 kg (148 lb 11.2 oz) 66.089 kg (145 lb 11.2 oz) 64.048 kg (141 lb 3.2 oz)     Exam:  General exam: Pleasant elderly male sitting up comfortably in bed this morning. Respiratory system: Clear to auscultation. No increased work of breathing. Cardiovascular system: S1 & S2 heard, RRR. No  JVD, murmurs, gallops, clicks. Trace bilateral leg edema. Telemetry: BiV paced. Gastrointestinal system: Abdomen is nondistended, soft and nontender. Normal bowel sounds heard. Central nervous system: Alert and oriented. No focal deficits. Extremities: Symmetric 5 x 5 power.  No obvious injuries noted   Data Reviewed: Basic Metabolic Panel:  Recent Labs Lab 08/01/14 0333 08/02/14 0415 08/03/14 0318 08/04/14 0352 08/05/14 0505  NA 140 137 136 137 137  K 4.6 4.2 4.3 3.9 3.7  CL 95* 94* 93* 92* 93*  CO2 34* 35* 35* 36* 33*  GLUCOSE 106* 98 133* 97 167*  BUN 78* 68* 61* 69* 68*  CREATININE 2.02* 1.69* 1.84* 1.81* 2.00*  CALCIUM 9.3 8.8* 8.9 8.8* 8.7*   Liver Function Tests: No results for input(s): AST, ALT, ALKPHOS, BILITOT, PROT, ALBUMIN in the last 168 hours. No results for input(s): LIPASE, AMYLASE in the last 168 hours. No results for input(s): AMMONIA in the last 168 hours. CBC:  Recent Labs Lab 08/01/14 1137 08/02/14 0415 08/03/14 0318 08/04/14 0352 08/05/14 0505  WBC 22.0* 14.1* 11.8* 11.8* 12.0*  HGB 9.7* 9.3* 9.5* 9.9* 9.7*  HCT 29.2* 27.4* 28.3* 29.8* 28.9*  MCV 95.4 94.8 93.4 94.0 93.2  PLT 390 384 412* 440* 415*   Cardiac Enzymes: No results for input(s): CKTOTAL, CKMB, CKMBINDEX, TROPONINI  in the last 168 hours. BNP (last 3 results)  Recent Labs  02/24/14 1606 02/26/14 1623 03/06/14 1129  PROBNP 2973.0* 18948.0* 5943.0*   CBG: No results for input(s): GLUCAP in the last 168 hours.  Recent Results (from the past 240 hour(s))  Clostridium Difficile by PCR     Status: None   Collection Time: 07/30/14 12:51 PM  Result Value Ref Range Status   C difficile by pcr NEGATIVE NEGATIVE Final         Studies: Dg Chest 2 View  08/04/2014   CLINICAL DATA:  Short of breath. Weakness. History of pulmonary fibrosis.  EXAM: CHEST  2 VIEW  COMPARISON:  08/01/2014.  FINDINGS: Changes from cardiac surgery are stable. Cardiac silhouette is mildly enlarged.  No mediastinal or hilar masses.  Left upper lobe airspace opacity noted previously has improved.  Areas of coarse reticular type opacity in both lungs and at both lung bases is stable consistent with scarring.  No pulmonary edema. Minimal left pleural effusions suggested. No pneumothorax.  IMPRESSION: 1. Improved left upper lobe pneumonia. 2. No new abnormalities.  Persistent areas of lung scarring.   Electronically Signed   By: Amie Portland M.D.   On: 08/04/2014 10:10   Ct Head Wo Contrast  08/05/2014   CLINICAL DATA:  Intermittent left-sided weakness and difficulty speaking for 1 day  EXAM: CT HEAD WITHOUT CONTRAST  TECHNIQUE: Contiguous axial images were obtained from the base of the skull through the vertex without intravenous contrast.  COMPARISON:  Aug 03, 2014  FINDINGS: Moderate generalized atrophy is stable. There is no intracranial mass, hemorrhage, extra-axial fluid collection, or midline shift. There is evidence of a prior infarct in the right frontal -parietal junction region with some involvement of the superior right temporal lobe as well, stable. There is evidence of a prior inferior, medial right occipital lobe infarct, stable. There is evidence of a prior infarct just superior to the superior most aspect the left lateral ventricle, stable. There is no new gray-white compartment lesion. No acute infarct apparent. Bony calvarium appears intact. The mastoid air cells clear.  IMPRESSION: Atrophy with prior infarcts as noted above. No acute infarct apparent. No hemorrhage or mass effect. No change from recent prior study.   Electronically Signed   By: Bretta Bang III M.D.   On: 08/05/2014 13:52   Ct Head Wo Contrast  08/04/2014   CLINICAL DATA:  Episode yesterday of difficulty word finding, left facial on left arm weakness. This was followed by right upper extremity weakness and paresthesias.  EXAM: CT HEAD WITHOUT CONTRAST  TECHNIQUE: Contiguous axial images were obtained from the base of  the skull through the vertex without intravenous contrast.  COMPARISON:  CT 08/02/2014, MR 03/26/2013.  FINDINGS: There is no intracranial hemorrhage or evidence of acute infarction. There is no extra-axial fluid collection. There is encephalomalacia due to remote infarction in the right occipital, right frontoparietal and left posterior frontal regions, these remote infarctions are unchanged from the prior MRI. No new infarction is evident. There is no mass, mass effect or midline shift. There is moderate generalized atrophy. No significant bony abnormality is evident. No interval change is evident from yesterday's CT.  IMPRESSION: Remote infarctions as detailed above. No evidence of new infarct or of infarct extension. Moderate chronic atrophy.   Electronically Signed   By: Ellery Plunk M.D.   On: 08/04/2014 02:24        Scheduled Meds: . allopurinol  200 mg Oral Daily  .  atorvastatin  40 mg Oral QPM  . clopidogrel  75 mg Oral Daily  . DULoxetine  60 mg Oral Daily  . feeding supplement (ENSURE ENLIVE)  237 mL Oral Q24H  . multivitamin with minerals  1 tablet Oral Daily  . pantoprazole  80 mg Oral Daily  . predniSONE  40 mg Oral Q breakfast  . sodium chloride  3 mL Intravenous Q12H  . tamsulosin  0.8 mg Oral QHS  . Warfarin - Pharmacist Dosing Inpatient   Does not apply q1800   Continuous Infusions: . sodium chloride 30 mL/hr at 08/05/14 1330  . heparin 950 Units/hr (08/05/14 1000)    Principal Problem:   Acute respiratory failure Active Problems:   TIA 2007, Dec 2014 (Plavix added)   Coronary artery disease-moderate Oct 2015   CKD (chronic kidney disease), stage III   Moderate to Severe Mitral Regurgitation   PAD- LLE PTA, RCEA, attempted LICA stent   Tissue AVR 2006 (in [place])   NICM (nonischemic cardiomyopathy)   PAF   Hyperlipidemia   Chronic anticoagulation   Pacemaker- MDT BiV 02/17/14   Acute on chronic systolic CHF (congestive heart failure)   Chronic systolic heart  failure   Acute renal failure superimposed on stage 3 chronic kidney disease   Pneumonia, organism unspecified   Acute respiratory failure with hypoxia   Abnormal CT scan, chest   Diarrhea   Amiodarone pulmonary toxicity   Aphasia   Carotid stenosis   S/P carotid endarterectomy   Pulmonary infiltrates   Left arm weakness    Time spent: 20 minutes    Merritt Kibby, MD, FACP, FHM. [facility] Pager [phone]  If 7PM-7AM, please contact night-coverage [email or web address] Password [facility] 08/05/2014, 6:34 PM    LOS: 16 days

## 2014-08-06 DIAGNOSIS — N179 Acute kidney failure, unspecified: Secondary | ICD-10-CM | POA: Insufficient documentation

## 2014-08-06 LAB — BASIC METABOLIC PANEL
ANION GAP: 10 (ref 5–15)
BUN: 70 mg/dL — AB (ref 6–20)
CALCIUM: 9 mg/dL (ref 8.9–10.3)
CO2: 35 mmol/L — ABNORMAL HIGH (ref 22–32)
Chloride: 94 mmol/L — ABNORMAL LOW (ref 101–111)
Creatinine, Ser: 1.82 mg/dL — ABNORMAL HIGH (ref 0.61–1.24)
GFR calc Af Amer: 38 mL/min — ABNORMAL LOW (ref >60)
GFR calc non Af Amer: 33 mL/min — ABNORMAL LOW (ref >60)
GLUCOSE: 88 mg/dL (ref 70–99)
POTASSIUM: 3.8 mmol/L (ref 3.5–5.1)
Sodium: 139 mmol/L (ref 135–145)

## 2014-08-06 LAB — CBC
HEMATOCRIT: 29.3 % — AB (ref 39.0–52.0)
Hemoglobin: 9.8 g/dL — ABNORMAL LOW (ref 13.0–17.0)
MCH: 31.4 pg (ref 26.0–34.0)
MCHC: 33.4 g/dL (ref 30.0–36.0)
MCV: 93.9 fL (ref 78.0–100.0)
PLATELETS: 419 10*3/uL — AB (ref 150–400)
RBC: 3.12 MIL/uL — ABNORMAL LOW (ref 4.22–5.81)
RDW: 18.1 % — AB (ref 11.5–15.5)
WBC: 15.7 10*3/uL — ABNORMAL HIGH (ref 4.0–10.5)

## 2014-08-06 LAB — PROTIME-INR
INR: 2.18 — AB (ref 0.00–1.49)
Prothrombin Time: 24.4 seconds — ABNORMAL HIGH (ref 11.6–15.2)

## 2014-08-06 LAB — HEPARIN LEVEL (UNFRACTIONATED): Heparin Unfractionated: 0.3 IU/mL (ref 0.30–0.70)

## 2014-08-06 MED ORDER — CARVEDILOL 3.125 MG PO TABS
3.1250 mg | ORAL_TABLET | Freq: Two times a day (BID) | ORAL | Status: DC
  Administered 2014-08-06 – 2014-08-07 (×2): 3.125 mg via ORAL
  Filled 2014-08-06 (×4): qty 1

## 2014-08-06 MED ORDER — TORSEMIDE 20 MG PO TABS
40.0000 mg | ORAL_TABLET | Freq: Every day | ORAL | Status: DC
  Administered 2014-08-07: 40 mg via ORAL
  Filled 2014-08-06: qty 2

## 2014-08-06 MED ORDER — WARFARIN SODIUM 2 MG PO TABS
2.0000 mg | ORAL_TABLET | Freq: Every day | ORAL | Status: DC
  Administered 2014-08-06: 2 mg via ORAL
  Filled 2014-08-06 (×2): qty 1

## 2014-08-06 NOTE — Progress Notes (Signed)
Physical Therapy Treatment Patient Details Name: Timothy Durham MRN: [id number] DOB: [birth year] Today's Date: 08/06/2014    History of Present Illness 81 y.o. male admitted with Acute hypoxic respiratory failure, and Nonischemic cardiomyopathy with acute exacerbation.    PT Comments    Pt admitted with above diagnosis. Pt currently with functional limitations due to balance and endurance deficits. Pt ambulated with RW with episode of right LE weakness as noted below.  Once pt sat down, right LE weakness resolved.  Pt did not appear to have slurred speech.  Nurse in room.  Terminated treatment due to this.   Pt will benefit from skilled PT to increase their independence and safety with mobility to allow discharge to the venue listed below.    Follow Up Recommendations  SNF;Supervision/Assistance - 24 hour     Equipment Recommendations  Rolling walker with 5" wheels;Other (comment) (home O2)    Recommendations for Other Services OT consult     Precautions / Restrictions Precautions Precautions: Fall Restrictions Weight Bearing Restrictions: No    Mobility  Bed Mobility               General bed mobility comments: sitting in chair   Transfers Overall transfer level: Needs assistance Equipment used: Rolling walker (2 wheeled) Transfers: Sit to/from Stand Sit to Stand: Min guard         General transfer comment: Pt needs min guard assist for safety.  Did not get feet placed correctly prior to standing and is impulsive in transitioning from sit to stand.    Ambulation/Gait Ambulation/Gait assistance: Min guard;Min assist Ambulation Distance (Feet): 60 Feet Assistive device: Rolling walker (2 wheeled) Gait Pattern/deviations: Step-through pattern;Decreased stride length;Decreased dorsiflexion - right;Leaning posteriorly Gait velocity: decreased Gait velocity interpretation: Below normal speed for age/gender General Gait Details: Pt used RW.  Pt initially was ambulating  well occasional cues to stay close to RW.  Pt continues to vary as he sometimes has narrow BOS and sometimes has wide BOS throwing his balance off.  When pt reached 45 feet, pt began to have difficulty moving his right LE when turning around outside in hallway.  Asked pt if he felt ok and pt stated he was fine.  Pt continued to have difficulty with advancing right LE all the way back to chair.  Upon arriving to chair, pt sat with cues for turning.  Nurse in room and witnessed pts imbalance and right LE weakness. Once pt in chair, pt denied episode saying, "The walker just throws me off balance."   Stairs            Wheelchair Mobility    Modified Rankin (Stroke Patients Only)       Balance           Standing balance support: Bilateral upper extremity supported;During functional activity Standing balance-Leahy Scale: Poor Standing balance comment: having difficulty with static stance today without UE support                    Cognition Arousal/Alertness: Awake/alert Behavior During Therapy: WFL for tasks assessed/performed Overall Cognitive Status: Within Functional Limits for tasks assessed Area of Impairment: Safety/judgement;Following commands;Awareness;Problem solving   Current Attention Level: Focused   Following Commands: Follows one step commands consistently Safety/Judgement: Decreased awareness of safety;Decreased awareness of deficits Awareness: Intellectual Problem Solving: Difficulty sequencing;Requires verbal cues General Comments: Pt slightly impulsive thus decreasing safety with transtional movements.      Exercises      General Comments General comments (  skin integrity, edema, etc.): SpO2 at 88% at end of walk on 2 LO2.  Wife came in at end of walk and updated wife on how pt did.       Pertinent Vitals/Pain Pain Assessment: No/denies pain  Sats >88% on 4LO2.      Home Living                      Prior Function            PT  Goals (current goals can now be found in the care plan section) Progress towards PT goals: Not progressing toward goals - comment (due to episode during ambulation)    Frequency  Min 3X/week    PT Plan Discharge plan needs to be updated    Co-evaluation             End of Session Equipment Utilized During Treatment: Gait belt;Oxygen Activity Tolerance: Patient limited by fatigue Patient left: in chair;with call bell/phone within reach;with chair alarm set;with family/visitor present     Time: 1828-8337 PT Time Calculation (min) (ACUTE ONLY): 15 min  Charges:  $Gait Training: 8-22 mins                    G Codes:      Seferina Brokaw, Amadeo Garnet September 01, 2014, 2:10 PM [employer] [facility] [phone] (pager)

## 2014-08-06 NOTE — Progress Notes (Signed)
PROGRESS NOTE    Timothy Durham XIP:[id number] DOB: 1930/06/12 DOA: 07/19/2014 PCP: Dennis Bast, MD  HPI/Brief narrative 81 year old male with history of nonischemic cardiomyopathy with EF of 30-35% as per last echo, NSTEMI in 2015 with cardiac cath showing nonobstructive CAD, chronic kidney disease stage III, history of stroke in 2000, history of bioprosthetic aVR, paroxysmal A. fib on chronic Coumadin, tobacco use with? COPD who presented with progressive shortness of breath for the past 3 days associated with nonproductive cough and wheezing. He reports being compliant with his medications. Chest x-ray on admission showing moderate vascular congestion. Labs showed elevated BNP of 1400. Patient admitted for acute hypoxic respiratory failure. He underwent extensive evaluation by pulmonary, ID and cardiology. At this time his hypoxia is felt to be due to organizing pneumonia from amiodarone which has been discontinued. Started on slow prednisone taper with plans for outpatient follow-up. Hypoxia is improving. On 5/7, patient developed stroke/TIA like presentation and stroke service is consulting. DC home when INR therapeutic >2.  Assessment/Plan:  Acute hypoxic respiratory failure - Initially felt to be due to infectious etiology/pneumonia or bronchitis. - Eventually presumed to be due to organizing pna 2ary to  Amiodarone toxicity + CHF: Recommendations are to hold amiodarone indefinitely, slow prednisone taper, wean oxygen for >88%.  - Improving. Was saturating in the low 90s on room air at bedrest.  Non-ischemic cardiomyopathy/acute on chronic systolic CHF - Continue Coreg hydralazine, imdur and statin..  - Not on ACEi/ ARB due to CKD - Advanced heart failure team following   Acute on chronic kidney disease stage III - Baseline creatinine around 1.8-2.  - [place] abd shows medical renal ds.  - continue to monitor serum creatinine  History of CVA and peripheral vascular disease/new CVA  versus TIA on 5/7 - Stroke team on board. INR at 2.1 currently  Atrial fibrillation - Biventricular paced rhythm - Amiodarone discontinued 5/2 - Anticoagulation on warfarin - CHDSVASC 5  GERD Continue PPI  Malnutrition,  - low albumin, nutrition consult obtained.  BPH/urinary retention  - of >600 on 5/1, in and out cathx1, increased flomax.  Chronic anemia - Stable  Leukocytosis - most likely related to steroids  Diarrhea - Resolved. C. difficile PCR negative.  Fall on 5/7 & 5/9 - No overt injuries. - CT head negative  Carotid stenosis - Status post right CEA - Left high-grade ICA stenosis but failed stenting - Doppler carotid pending - Vascular surgery consultation appreciated: Do not recommend any further intervention at this time. Recommend repeating carotid duplex in 1 year for follow-up.  Essential hypertension - Controlled  Hyperlipidemia - Continue statins   Code Status: DNR Family Communication: Discussed at length with patient  Disposition Plan: DC home when medically stable and INR >2  Consultants:  Advanced CHF team  PCCM  ID-signed off 5/2  Neurology/stroke service  Procedures:  None  Antibiotics:  Azithromycin- completed   Subjective: No new complaints reported to me. PT reporting having to stop PT due to R LE weakness  Objective: Filed Vitals:   08/06/14 1000 08/06/14 1017 08/06/14 1516 08/06/14 1756  BP: 122/32 122/37 127/34 124/33  Pulse: 68 69 78 78  Temp: 97.8 F (36.6 C)  97.8 F (36.6 C) 97.7 F (36.5 C)  TempSrc: Oral  Oral Oral  Resp: 18  18 18   Height:      Weight:      SpO2: 93%  98% 100%    Intake/Output Summary (Last 24 hours) at 08/06/14 1823 Last data filed  at 08/06/14 1754  Gross per 24 hour  Intake    720 ml  Output   1425 ml  Net   -705 ml   Filed Weights   08/04/14 0435 08/05/14 0606 08/06/14 0536  Weight: 66.089 kg (145 lb 11.2 oz) 64.048 kg (141 lb 3.2 oz) 64.501 kg (142 lb 3.2 oz)      Exam:  General exam: Pleasant elderly male , in NAD, alert and awake Respiratory system: Clear to auscultation. No increased work of breathing. Cardiovascular system: S1 & S2 heard, RRR. No JVD, murmurs, gallops, clicks. Trace bilateral leg edema. Gastrointestinal system: Abdomen is nondistended, soft and nontender. Normal bowel sounds heard. Central nervous system: Alert and oriented. Answers questions appropriately   Data Reviewed: Basic Metabolic Panel:  Recent Labs Lab 08/02/14 0415 08/03/14 0318 08/04/14 0352 08/05/14 0505 08/06/14 0545  NA 137 136 137 137 139  K 4.2 4.3 3.9 3.7 3.8  CL 94* 93* 92* 93* 94*  CO2 35* 35* 36* 33* 35*  GLUCOSE 98 133* 97 167* 88  BUN 68* 61* 69* 68* 70*  CREATININE 1.69* 1.84* 1.81* 2.00* 1.82*  CALCIUM 8.8* 8.9 8.8* 8.7* 9.0   Liver Function Tests: No results for input(s): AST, ALT, ALKPHOS, BILITOT, PROT, ALBUMIN in the last 168 hours. No results for input(s): LIPASE, AMYLASE in the last 168 hours. No results for input(s): AMMONIA in the last 168 hours. CBC:  Recent Labs Lab 08/02/14 0415 08/03/14 0318 08/04/14 0352 08/05/14 0505 08/06/14 0545  WBC 14.1* 11.8* 11.8* 12.0* 15.7*  HGB 9.3* 9.5* 9.9* 9.7* 9.8*  HCT 27.4* 28.3* 29.8* 28.9* 29.3*  MCV 94.8 93.4 94.0 93.2 93.9  PLT 384 412* 440* 415* 419*   Cardiac Enzymes: No results for input(s): CKTOTAL, CKMB, CKMBINDEX, TROPONINI in the last 168 hours. BNP (last 3 results)  Recent Labs  02/24/14 1606 02/26/14 1623 03/06/14 1129  PROBNP 2973.0* 18948.0* 5943.0*   CBG: No results for input(s): GLUCAP in the last 168 hours.  Recent Results (from the past 240 hour(s))  Clostridium Difficile by PCR     Status: None   Collection Time: 07/30/14 12:51 PM  Result Value Ref Range Status   C difficile by pcr NEGATIVE NEGATIVE Final         Studies: Ct Head Wo Contrast  08/05/2014   CLINICAL DATA:  Intermittent left-sided weakness and difficulty speaking for 1  day  EXAM: CT HEAD WITHOUT CONTRAST  TECHNIQUE: Contiguous axial images were obtained from the base of the skull through the vertex without intravenous contrast.  COMPARISON:  Aug 03, 2014  FINDINGS: Moderate generalized atrophy is stable. There is no intracranial mass, hemorrhage, extra-axial fluid collection, or midline shift. There is evidence of a prior infarct in the right frontal -parietal junction region with some involvement of the superior right temporal lobe as well, stable. There is evidence of a prior inferior, medial right occipital lobe infarct, stable. There is evidence of a prior infarct just superior to the superior most aspect the left lateral ventricle, stable. There is no new gray-white compartment lesion. No acute infarct apparent. Bony calvarium appears intact. The mastoid air cells clear.  IMPRESSION: Atrophy with prior infarcts as noted above. No acute infarct apparent. No hemorrhage or mass effect. No change from recent prior study.   Electronically Signed   By: Bretta Bang III M.D.   On: 08/05/2014 13:52        Scheduled Meds: . allopurinol  200 mg Oral Daily  . atorvastatin  40 mg Oral QPM  . carvedilol  3.125 mg Oral BID WC  . clopidogrel  75 mg Oral Daily  . DULoxetine  60 mg Oral Daily  . feeding supplement (ENSURE ENLIVE)  237 mL Oral Q24H  . multivitamin with minerals  1 tablet Oral Daily  . pantoprazole  80 mg Oral Daily  . predniSONE  40 mg Oral Q breakfast  . sodium chloride  3 mL Intravenous Q12H  . tamsulosin  0.8 mg Oral QHS  . [START ON 08/07/2014] torsemide  40 mg Oral Daily  . warfarin  2 mg Oral q1800  . Warfarin - Pharmacist Dosing Inpatient   Does not apply q1800   Continuous Infusions:    Principal Problem:   Acute respiratory failure Active Problems:   TIA 2007, Dec 2014 (Plavix added)   Coronary artery disease-moderate Oct 2015   CKD (chronic kidney disease), stage III   Moderate to Severe Mitral Regurgitation   PAD- LLE PTA, RCEA,  attempted LICA stent   Tissue AVR 2006 (in [place])   NICM (nonischemic cardiomyopathy)   PAF   Hyperlipidemia   Chronic anticoagulation   Pacemaker- MDT BiV 02/17/14   Acute on chronic systolic CHF (congestive heart failure)   Chronic systolic heart failure   Acute renal failure superimposed on stage 3 chronic kidney disease   Pneumonia, organism unspecified   Acute respiratory failure with hypoxia   Abnormal CT scan, chest   Diarrhea   Amiodarone pulmonary toxicity   Aphasia   Carotid stenosis   S/P carotid endarterectomy   Pulmonary infiltrates   Left arm weakness    Time spent: 20 minutes    Penny Pia, MD, FACP, [facility]. [facility] Pager [phone]  If 7PM-7AM, please contact night-coverage [email or web address] Password [email or web address] 08/06/2014, 6:23 PM    LOS: 17 days

## 2014-08-06 NOTE — Progress Notes (Signed)
STROKE TEAM PROGRESS NOTE   SUBJECTIVE (INTERVAL HISTORY) No family is at the bedside.  He had 2 episodes of left sided hemiparesis with slurry speech. Repeat CT head negative for bleeding and EEG normal. BP mildly low at the time of episodes. Discontinued BP meds including hydralazine, coreg, Imdur and torsemide. His BP today in 120s. He did not have any recurrent events overnight. Currently sitting in chair no neuro complains.    OBJECTIVE Temp:  [97.6 F (36.4 C)-98.3 F (36.8 C)] 97.8 F (36.6 C) (05/11 1000) Pulse Rate:  [68-75] 69 (05/11 1017) Cardiac Rhythm:  [-] A-V Sequential paced (05/10 1958) Resp:  [16-18] 18 (05/11 1000) BP: (116-126)/(32-85) 122/37 mmHg (05/11 1017) SpO2:  [93 %-100 %] 93 % (05/11 1000) Weight:  [142 lb 3.2 oz (64.501 kg)] 142 lb 3.2 oz (64.501 kg) (05/11 0536)  No results for input(s): GLUCAP in the last 168 hours.  Recent Labs Lab 08/02/14 0415 08/03/14 0318 08/04/14 0352 08/05/14 0505 08/06/14 0545  NA 137 136 137 137 139  K 4.2 4.3 3.9 3.7 3.8  CL 94* 93* 92* 93* 94*  CO2 35* 35* 36* 33* 35*  GLUCOSE 98 133* 97 167* 88  BUN 68* 61* 69* 68* 70*  CREATININE 1.69* 1.84* 1.81* 2.00* 1.82*  CALCIUM 8.8* 8.9 8.8* 8.7* 9.0   No results for input(s): AST, ALT, ALKPHOS, BILITOT, PROT, ALBUMIN in the last 168 hours.  Recent Labs Lab 08/02/14 0415 08/03/14 0318 08/04/14 0352 08/05/14 0505 08/06/14 0545  WBC 14.1* 11.8* 11.8* 12.0* 15.7*  HGB 9.3* 9.5* 9.9* 9.7* 9.8*  HCT 27.4* 28.3* 29.8* 28.9* 29.3*  MCV 94.8 93.4 94.0 93.2 93.9  PLT 384 412* 440* 415* 419*   No results for input(s): CKTOTAL, CKMB, CKMBINDEX, TROPONINI in the last 168 hours.  Recent Labs  08/04/14 0352 08/05/14 0505 08/06/14 0545  LABPROT 17.2* 19.1* 24.4*  INR 1.39 1.59* 2.18*   No results for input(s): COLORURINE, LABSPEC, PHURINE, GLUCOSEU, HGBUR, BILIRUBINUR, KETONESUR, PROTEINUR, UROBILINOGEN, NITRITE, LEUKOCYTESUR in the last 72 hours.  Invalid input(s):  APPERANCEUR     Component Value Date/Time   CHOL 147 08/03/2014 1630   TRIG 63 08/03/2014 1630   HDL 54 08/03/2014 1630   CHOLHDL 2.7 08/03/2014 1630   VLDL 13 08/03/2014 1630   LDLCALC 80 08/03/2014 1630   Lab Results  Component Value Date   HGBA1C 6.2* 08/03/2014      Component Value Date/Time   LABOPIA NONE DETECTED 03/26/2013 1408   COCAINSCRNUR NONE DETECTED 03/26/2013 1408   LABBENZ NONE DETECTED 03/26/2013 1408   AMPHETMU NONE DETECTED 03/26/2013 1408   THCU NONE DETECTED 03/26/2013 1408   LABBARB NONE DETECTED 03/26/2013 1408    No results for input(s): ETH in the last 168 hours.  I have personally reviewed the radiological images below and agree with the radiology interpretations.  Ct Head Wo Contrast 08/05/14 Atrophy with prior infarcts as noted above. No acute infarct apparent. No hemorrhage or mass effect. No change from recent prior study.  08/04/14 - Remote infarctions as detailed above. No evidence of new infarct or of infarct extension. Moderate chronic atrophy.  08/02/2014   IMPRESSION: 1. No acute abnormality. 2. Stable old infarcts, as described above. 3. Stable mild to moderate diffuse cerebral atrophy. These results were called by telephone at the time of interpretation on 08/02/2014 at 9:32 am to Dr. Thad Ranger, who verbally acknowledged these results.    Dg Chest Port 1 View  08/01/2014   IMPRESSION: Persisting bilateral airspace  disease, which is unchanged since the most recent plain films though has improved since the comparisons of 07/20/2014.  Persisting small pleural effusions.    Carotid Doppler  Right = 1-39% ICA stenosis. Left = 40-59% ICA stenosis, based on diastolic velocity, however, based on systolic velocity and ratio, the range is 60-79%.  TCD pending report  EEG normal   2D Echocardiogram  - EF 25-30%, Global hypokinesis and inferior lateral akinesis; overall severely reduced LV function; grade 2 diastolic dysfunction with elevated LV  filling pressure; bioprosthetic aortic valve; mild AI; moderate LAE; mild MR and TR; mildly elevated pulmonary pressure.  PHYSICAL EXAM  Temp:  [97.6 F (36.4 C)-98.3 F (36.8 C)] 97.8 F (36.6 C) (05/11 1000) Pulse Rate:  [68-75] 69 (05/11 1017) Resp:  [16-18] 18 (05/11 1000) BP: (116-126)/(32-85) 122/37 mmHg (05/11 1017) SpO2:  [93 %-100 %] 93 % (05/11 1000) Weight:  [142 lb 3.2 oz (64.501 kg)] 142 lb 3.2 oz (64.501 kg) (05/11 0536)  General - Well nourished, well developed, in no apparent distress.  Ophthalmologic - Sharp disc margins OU.  Cardiovascular - Regular rate and rhythm with no murmur.  Mental Status -  Level of arousal and orientation to time, place, and person were intact. Language including expression, naming, repetition, comprehension was assessed and found intact. Fund of Knowledge was assessed and was intact.  Cranial Nerves II - XII - II - Visual field intact OU. III, IV, VI - Extraocular movements intact. V - Facial sensation intact bilaterally. VII - Facial movement intact bilaterally. VIII - Hearing & vestibular intact bilaterally. X - Palate elevates symmetrically. XI - Chin turning & shoulder shrug intact bilaterally. XII - Tongue protrusion intact.  Motor Strength - The patient's strength was normal in all extremities and pronator drift was absent.  Bulk was normal and fasciculations were absent.   Motor Tone - Muscle tone was assessed at the neck and appendages and was normal.  Reflexes - The patient's reflexes were symmetrical in all extremities and he had no pathological reflexes.  Sensory - Light touch, temperature/pinprick were assessed and were symmetrical.    Coordination - The patient had normal movements in the hands with no ataxia or dysmetria.  Tremor was absent.  Gait and Station - deferred due to safety concerns.   ASSESSMENT/PLAN Mr. Thane Age is a 81 y.o. male with history of CAD and the STEMI in 2015, HTN, CKD, HLD, CHF  with EF 30-35%, s/p AVR and pacemaker, pAfib on Coumadin, PAD s/p stent on plavix, stroke in 2000, 2007, and a TIA in 2014, s/p right CEA with left body stenosis admitted for pneumonia and fluid overload. His Coumadin and Plavix was on hold for bronchoscope, but later resumed Coumadin with IV heparin bridge. 08/02/14 morning, he went to bathroom, felt dizzy and fell, found to have difficulty getting words out, left facial and left arm weakness, episodic, wax and wane, seems also involving right hand weakness and numbness. Symptoms now resolved. Plavix resumed 08/03/14.  Stroke/TIA:  Pt had 2 more left sided hemiparesis and slurry speech 08/05/14. His episode can certainly be due to paroxysmal A. fib, however he was on heparin drip and therapeutic heparin levels. He has carotid artery stenosis s/p right CEA, and the left ICA stenosis but not fit to the localization. Vascular surgery recommended no intervention. BP mildly low at the time of episodes, would recommend avoid hypotension and BP goal 120-140.   MRI  MRA not able to perform due to pacer  CT  showed old bilateral infarcts but no acute abnormality  repeat CT head no new infarct and no bleeding  Carotid Doppler - Left = 40-59% ICA stenosis, based on diastolic velocity, however, based on systolic velocity and ratio, the range is 60-79%.  2D Echo  EF 25-30%  LDL 80  HgbA1c 6.2`  TCD pending report  Heparin drip for VTE prophylaxis  Diet Heart Room service appropriate?: Yes; Fluid consistency:: Thin   clopidogrel 75 mg orally every day and warfarin prior to admission, now on clopidogrel 75 mg orally every day, warfarin and heparin  Patient counseled to be compliant with his antithrombotic medications  Ongoing aggressive stroke risk factor management  Paroxysmal A. Fib  Off heparin drip as INR therapeutic  On Coumadin  INR subtherapeutic 1.36->1.39->1.59->2.18  Carotid stenosis  S/p right CEA  Carotid Doppler  Left = 40-59% ICA  stenosis, based on diastolic velocity, however, based on systolic velocity and ratio, the range is 60-79%.  On plavix  Vascular surgery recommended no intervention, repeat carotid Doppler in 1 year  Follow-up with Dr. Arbie Cookey  BP control  Avoid hypotension due to ICA stenosis  Episodes seem to be controlled with better BP  Recommend BP goal 120-140  OK with resume low dose coreg and imdur  Close monitoring  CAD with STEMI in 2015  Cardiology is following  On Plavix and torsemide  EF 25-30%  On coumadin too  Hyperlipidemia  Home meds:  Lipitor 40   Currently on Lipitor 40  LDL 80, goal < 70  Continue statin at discharge  Other Stroke Risk Factors  Advanced age  Hx stroke/TIA in 2000, 2007, and 2014  Coronary artery disease  PAD s/p left femoral stenting  Other Active Problems  INR subtherapeutic  Chronic kidney disease  Anemia  Leukocytosis, improving  Other Pertinent History  S/p AVR  Pacemaker in place  Hospital day # 17   Marvel Plan, MD PhD Stroke Neurology 08/06/2014 1:34 PM    To contact Stroke Continuity provider, please refer to [email or web address]. After hours, contact General Neurology

## 2014-08-06 NOTE — Plan of Care (Signed)
Problem: Acute Treatment Outcomes Goal: Neuro exam at baseline or improved Outcome: Progressing Neuro exam at baseline

## 2014-08-06 NOTE — Progress Notes (Signed)
PT Note See full PT note for treatment today.  MD:Pt ambulated with RW with episode of right LE weakness as noted below. Once pt sat down, right LE weakness resolved. Pt did not appear to have slurred speech. Nurse in room. Terminated treatment due to this.  Wanted to make MD aware.    Will continue PT as able.  Thanks. [facility] [phone] (pager)

## 2014-08-06 NOTE — Progress Notes (Signed)
Patient ID: Timothy Durham, male   DOB: 1930-05-04, 81 y.o.   MRN: [id number]   Timothy Durham is an 81 yo male with a history of prior CVA in 2000 secondary to PAF, nonischemic cardimoyopathy s/p [employer] CRT-P (02/17/14), chronic systolic HF (EF 30-35%), non-obstructive CAD, HTN, CKD stage IV, HLD, PAD and bioprosthetic AVR.   Followed closely in HF clinic and recently doing quite well. Diuretics had to be cut back a bit due to hypotension. Weight has been stable around 154 pounds.   Admitted 4/24 ago with worsening SOB with fevers and chills with WBC 12.2 and left shift. Weight stable. Initial CXR with mild vascular congestion. Felt to have acute bronchitis. However dyspnea got worse and CXR 4/26 showed worsening bilateral infiltrates ? HF vs multifocal PNA. IV Lasix and abx initially, has completed antibiotic course.  Steroids increased.   On 5/7, developed dysarthria and left arm weakness.  CT head with no acute changes, unable to do MRI with CRT-P device.  Symptoms totally resolved, TIA.  Neurology following.    Gradual improvement, walking more. CXR looking better.   CT chest with diffuse, bilateral consolidated airspace disease.    Barium swallow with "nonspecific esophageal dysmotility disorder."   Seen by vascular, carotid dopplers with < 40% stenosis at right CEA site, 40-60% stenosis on left.  The left-sided weakness with his TIAs does not correspond to his left carotid.   Timothy Durham he had intermittent left sided weakness and difficulty speaking - lasting 15 minutes followed by another episode with neurologist in the room.  Confused did not know his wife. HF meds held. This has resolved. EEG was normal. CT of head no acute changes from previous on 5/8.   Frustrated today. Denies SOB.   Scheduled Meds: . allopurinol  200 mg Oral Daily  . atorvastatin  40 mg Oral QPM  . clopidogrel  75 mg Oral Daily  . DULoxetine  60 mg Oral Daily  . feeding supplement (ENSURE ENLIVE)  237 mL Oral Q24H   . multivitamin with minerals  1 tablet Oral Daily  . pantoprazole  80 mg Oral Daily  . predniSONE  40 mg Oral Q breakfast  . sodium chloride  3 mL Intravenous Q12H  . tamsulosin  0.8 mg Oral QHS  . warfarin  2 mg Oral q1800  . Warfarin - Pharmacist Dosing Inpatient   Does not apply q1800   Continuous Infusions:   PRN Meds:.acetaminophen, albuterol   Filed Vitals:   08/06/14 0219 08/06/14 0536 08/06/14 1000 08/06/14 1017  BP: 126/43 116/85 122/32 122/37  Pulse: 72 69 68 69  Temp: 97.6 F (36.4 C) 98.3 F (36.8 C) 97.8 F (36.6 C)   TempSrc: Oral Oral Oral   Resp: 18 16 18    Height:      Weight:  142 lb 3.2 oz (64.501 kg)    SpO2: 100% 98% 93%     Intake/Output Summary (Last 24 hours) at 08/06/14 1253 Last data filed at 08/06/14 1100  Gross per 24 hour  Intake    600 ml  Output   1350 ml  Net   -750 ml    LABS: Basic Metabolic Panel:  Recent Labs  40/98/11 0505 08/06/14 0545  NA 137 139  K 3.7 3.8  CL 93* 94*  CO2 33* 35*  GLUCOSE 167* 88  BUN 68* 70*  CREATININE 2.00* 1.82*  CALCIUM 8.7* 9.0   Liver Function Tests: No results for input(s): AST, ALT, ALKPHOS, BILITOT, PROT, ALBUMIN in the  last 72 hours. No results for input(s): LIPASE, AMYLASE in the last 72 hours. CBC:  Recent Labs  08/05/14 0505 08/06/14 0545  WBC 12.0* 15.7*  HGB 9.7* 9.8*  HCT 28.9* 29.3*  MCV 93.2 93.9  PLT 415* 419*   Cardiac Enzymes: No results for input(s): CKTOTAL, CKMB, CKMBINDEX, TROPONINI in the last 72 hours. BNP: Invalid input(s): POCBNP D-Dimer: No results for input(s): DDIMER in the last 72 hours. Hemoglobin A1C:  Recent Labs  08/03/14 1630  HGBA1C 6.2*   Fasting Lipid Panel:  Recent Labs  08/03/14 1630  CHOL 147  HDL 54  LDLCALC 80  TRIG 63  CHOLHDL 2.7   Thyroid Function Tests: No results for input(s): TSH, T4TOTAL, T3FREE, THYROIDAB in the last 72 hours.  Invalid input(s): FREET3 Anemia Panel: No results for input(s): VITAMINB12,  FOLATE, FERRITIN, TIBC, IRON, RETICCTPCT in the last 72 hours.  RADIOLOGY: Dg Chest 2 View  08/04/2014   CLINICAL DATA:  Short of breath. Weakness. History of pulmonary fibrosis.  EXAM: CHEST  2 VIEW  COMPARISON:  08/01/2014.  FINDINGS: Changes from cardiac surgery are stable. Cardiac silhouette is mildly enlarged. No mediastinal or hilar masses.  Left upper lobe airspace opacity noted previously has improved.  Areas of coarse reticular type opacity in both lungs and at both lung bases is stable consistent with scarring.  No pulmonary edema. Minimal left pleural effusions suggested. No pneumothorax.  IMPRESSION: 1. Improved left upper lobe pneumonia. 2. No new abnormalities.  Persistent areas of lung scarring.   Electronically Signed   By: Amie Portland M.D.   On: 08/04/2014 10:10   Dg Chest 2 View  07/31/2014   CLINICAL DATA:  Acute respiratory failure with hypoxia. Pt hx COPD, CAD  EXAM: CHEST  2 VIEW  COMPARISON:  07/30/2014  FINDINGS: Patchy areas of airspace and interstitial opacities in the lungs are stable from most recent prior exam. No new lung abnormalities. No significant pleural effusion and no pneumothorax.  Changes from cardiac surgery and aortic valve replacement are stable. Cardiac silhouette is mildly enlarged. No mediastinal or hilar masses. Left anterior chest wall sequential pacemaker is stable and well positioned.  IMPRESSION: No change from the previous day's study. Bilateral patchy airspace and interstitial lung opacities are stable. No new abnormalities.   Electronically Signed   By: Amie Portland M.D.   On: 07/31/2014 13:02   Dg Chest 2 View  07/30/2014   CLINICAL DATA:  Hypoxia.  Weakness.  EXAM: CHEST  2 VIEW  COMPARISON:  07/28/2014.  FINDINGS: There is improvement from 07/28/2014, with partial clearance of the multifocal bilateral airspace opacities although significant opacities persist, left greater than right. Mild right lateral base pleural thickening persists. No significant  effusion is evident. There is moderate unchanged cardiomegaly. There is prior sternotomy and aortic valvuloplasty. There are intact appearances of the transvenous leads.  IMPRESSION: Slight improvement. Multifocal bilateral airspace opacities persist, partially cleared.   Electronically Signed   By: Ellery Plunk M.D.   On: 07/30/2014 06:50   Dg Chest 2 View  07/28/2014   CLINICAL DATA:  Respiratory failure .  EXAM: CHEST  2 VIEW  COMPARISON:  07/22/2014 .  CT 07/25/2014.  FINDINGS: Mediastinum and hilar structures are normal. Prior cardiac valve replacement. Cardiac pacer noted with lead tips in stable position. Persistent multifocal bilateral pulmonary infiltrates, no interim change. Small right pleural effusion. No pneumothorax.  IMPRESSION: 1. Persistent multifocal bilateral pulmonary infiltrates most consistent with pneumonia. No significant interim change.  2.  Prior cardiac valve replacement. Cardiac pacer stable position. Stable cardiomegaly .   Electronically Signed   By: Maisie Fus  Register   On: 07/28/2014 08:33   Dg Chest 2 View  07/20/2014   CLINICAL DATA:  Shortness of breath.  Pacemaker.  EXAM: CHEST  2 VIEW  COMPARISON:  05/04/2014.  FINDINGS: Mildly increased cardiac silhouette. Triple lead pacer unchanged from priors. Moderate vascular congestion without overt failure or significant consolidation. Mild to moderate pleural effusions, greater on the RIGHT. Aortic valvular placement with prior median sternotomy. No acute osseous findings.  IMPRESSION: Cardiomegaly with stable appearance of the transvenous pacer.  Moderate vascular congestion without overt failure. Overall improved from prior radiograph in February.   Electronically Signed   By: Davonna Belling M.D.   On: 07/20/2014 02:23   Ct Head Wo Contrast  08/05/2014   CLINICAL DATA:  Intermittent left-sided weakness and difficulty speaking for 1 day  EXAM: CT HEAD WITHOUT CONTRAST  TECHNIQUE: Contiguous axial images were obtained from the  base of the skull through the vertex without intravenous contrast.  COMPARISON:  Aug 03, 2014  FINDINGS: Moderate generalized atrophy is stable. There is no intracranial mass, hemorrhage, extra-axial fluid collection, or midline shift. There is evidence of a prior infarct in the right frontal -parietal junction region with some involvement of the superior right temporal lobe as well, stable. There is evidence of a prior inferior, medial right occipital lobe infarct, stable. There is evidence of a prior infarct just superior to the superior most aspect the left lateral ventricle, stable. There is no new gray-white compartment lesion. No acute infarct apparent. Bony calvarium appears intact. The mastoid air cells clear.  IMPRESSION: Atrophy with prior infarcts as noted above. No acute infarct apparent. No hemorrhage or mass effect. No change from recent prior study.   Electronically Signed   By: Bretta Bang III M.D.   On: 08/05/2014 13:52   Ct Head Wo Contrast  08/04/2014   CLINICAL DATA:  Episode yesterday of difficulty word finding, left facial on left arm weakness. This was followed by right upper extremity weakness and paresthesias.  EXAM: CT HEAD WITHOUT CONTRAST  TECHNIQUE: Contiguous axial images were obtained from the base of the skull through the vertex without intravenous contrast.  COMPARISON:  CT 08/02/2014, Timothy 03/26/2013.  FINDINGS: There is no intracranial hemorrhage or evidence of acute infarction. There is no extra-axial fluid collection. There is encephalomalacia due to remote infarction in the right occipital, right frontoparietal and left posterior frontal regions, these remote infarctions are unchanged from the prior MRI. No new infarction is evident. There is no mass, mass effect or midline shift. There is moderate generalized atrophy. No significant bony abnormality is evident. No interval change is evident from yesterday's CT.  IMPRESSION: Remote infarctions as detailed above. No evidence  of new infarct or of infarct extension. Moderate chronic atrophy.   Electronically Signed   By: Ellery Plunk M.D.   On: 08/04/2014 02:24   Ct Head Wo Contrast  08/02/2014   CLINICAL DATA:  Right facial droop.  History of stroke.  EXAM: CT HEAD WITHOUT CONTRAST  TECHNIQUE: Contiguous axial images were obtained from the base of the skull through the vertex without intravenous contrast.  COMPARISON:  Head CT dated 03/25/2013 and brain Timothy dated 03/26/2013.  FINDINGS: Previously demonstrated in bold right occipital, right frontoparietal and small left posterior frontal infarcts are unchanged. Diffusely enlarged ventricles and subarachnoid spaces. No intracranial hemorrhage, mass lesion or CT evidence of acute infarction.  Unremarkable bones and included paranasal sinuses.  IMPRESSION: 1. No acute abnormality. 2. Stable old infarcts, as described above. 3. Stable mild to moderate diffuse cerebral atrophy. These results were called by telephone at the time of interpretation on 08/02/2014 at 9:32 am to Dr. Thad Ranger, who verbally acknowledged these results.   Electronically Signed   By: Beckie Salts M.D.   On: 08/02/2014 09:32   Ct Chest Wo Contrast  07/25/2014   CLINICAL DATA:  Hypoxia and progressive shortness of breath for the past 3 days. Nonproductive cough and wheeze  EXAM: CT CHEST WITHOUT CONTRAST  TECHNIQUE: Multidetector CT imaging of the chest was performed following the standard protocol without IV contrast.  COMPARISON:  None.  FINDINGS: THORACIC INLET/BODY WALL:  There is a biventricular pacer from the left with leads in unremarkable position. No acute findings at the thoracic inlet.  There is an incidental 1 cm intramuscular lipoma in the right chest wall.  MEDIASTINUM:  Cardiomegaly which is chronic based on chest x-rays. No pericardial effusion. The patient is status post median sternotomy with aortic valve replacement. The ascending aorta measures 41 mm in diameter. There is mild enlargement of  mediastinal lymph nodes which could be reactive to the presumed pneumonia or sequela of remote granulomatous disease (there is also scattered nodal coarse calcification). No acute vascular findings. Negative esophagus.  LUNG WINDOWS:  There is patchy consolidative and ground-glass opacities in all lobes. Airspace disease is most extensive in the left upper lobe and most dense in the posterior segment right upper lobe. Small bilateral pleural effusions with interlobular septal thickening. No cavitation. No evidence of loculated effusion.  UPPER ABDOMEN:  Granulomatous changes in the liver and spleen.  OSSEOUS:  No acute findings.  IMPRESSION: 1. Multilobar pneumonia. 2. Superimposed interstitial edema and small pleural effusions. 3. Chronic and postoperative findings noted above.   Electronically Signed   By: Marnee Spring M.D.   On: 07/25/2014 08:00   Dg Esophagus  07/29/2014   CLINICAL DATA:  Dysphagia.  Choking.  Bilateral pneumonia.  EXAM: ESOPHOGRAM/BARIUM SWALLOW  TECHNIQUE: Combined double contrast and single contrast examination performed using effervescent crystals, thick barium liquid, and thin barium liquid.  FLUOROSCOPY TIME:  Radiation Exposure Index (as provided by the fluoroscopic device): Dose area product: 829.79uGy*m^2  COMPARISON:  Radiographs 07/28/2014.  Chest CT 07/25/2014.  FINDINGS: Pharyngeal phase of swallowing was normal. The study was carried out in the AP projection due to patient condition. Nonspecific esophageal dysmotility was present without stricture. 0/3 primary waves reach the gastroesophageal junction. Tertiary contractions were observed. Fetal on esophagus incidentally noted.  There is no bear ear passage of the 13 mm barium tablet challenge however the tablet remain static in the lower esophagus just proximal to the gastroesophageal junction for several min. No gastroesophageal reflux could be elicited with water siphon test.  Esophageal stasis was present in the supine  position. Esophageal folds appeared within normal limits.  IMPRESSION: 1. No esophageal stricture or mass. 2. Nonspecific esophageal dysmotility disorder with with tertiary contractions. Stasis in the supine position.   Electronically Signed   By: Andreas Newport M.D.   On: 07/29/2014 09:39   US Renal  07/20/2014   CLINICAL DATA:  Acute kidney injury.  Hypertension.  EXAM: RENAL/URINARY TRACT ULTRASOUND COMPLETE  COMPARISON:  None.  FINDINGS: Right Kidney:  Length: 10.3 cm. Echogenicity within normal limits. No mass or hydronephrosis visualized. Diffuse cortical thinning with prominent renal sinus fat.  Left Kidney:  Length: 11.1 cm. Echogenicity within  normal limits. No mass or hydronephrosis visualized. Diffuse cortical thinning with prominent renal sinus fat.  Bladder:  Multiple diverticula. The largest measures 4.9 cm in maximum diameter. Small amount of internal debris with no definite calculi  IMPRESSION: 1. Diffuse bilateral renal cortical atrophy. 2. Multiple bladder diverticula. 3. Small amount of debris in the bladder with no definite calculi.   Electronically Signed   By: Beckie Salts M.D.   On: 07/20/2014 12:27   Dg Chest Port 1 View  08/01/2014   CLINICAL DATA:  81 year old male with a history of pulmonary infiltrates and shortness of breath.  EXAM: PORTABLE CHEST - 1 VIEW  COMPARISON:  Multiple prior, most recent 07/31/2014, 07/30/2014  FINDINGS: Cardiomediastinal silhouette unchanged.  Atherosclerosis.  Unchanged cardiac pacing device with 3 leads in place.  Bilateral airspace disease, involving the right greater than left bases and the left greater than right apices. Distribution is similar to comparison plain film. Over the course of several chest x-ray this has improved, dating to 07/22/2014 and 07/20/2014.  Trace pleural effusions.  Calcified right hilar lymph nodes.  IMPRESSION: Persisting bilateral airspace disease, which is unchanged since the most recent plain films though has improved  since the comparisons of 07/20/2014.  Persisting small pleural effusions.  Signed,  Yvone Neu. Loreta Ave, DO  [facility]  [facility]   Electronically Signed   By: Gilmer Mor D.O.   On: 08/01/2014 07:19   Dg Chest Port 1 View  07/22/2014   CLINICAL DATA:  Acute onset of hypoxemia.  Initial encounter.  EXAM: PORTABLE CHEST - 1 VIEW  COMPARISON:  Chest radiograph performed 07/20/2014  FINDINGS: The lungs are well-aerated. Worsening bilateral midlung airspace opacification raises concern for multifocal pneumonia. Small bilateral pleural effusions are seen, and underlying interstitial edema cannot be excluded. Vascular congestion is noted. No pneumothorax is seen.  The cardiomediastinal silhouette is mildly enlarged. The patient is status post median sternotomy. A pacemaker is noted overlying the left chest wall, with leads ending overlying the right atrium and right ventricle. No acute osseous abnormalities are seen.  IMPRESSION: 1. Worsening bilateral mid lung airspace opacification raises concern for multifocal pneumonia. 2. Small bilateral pleural effusions, with vascular congestion and mild cardiomegaly. Underlying interstitial edema cannot be excluded.   Electronically Signed   By: Roanna Raider M.D.   On: 07/22/2014 04:30    PHYSICAL EXAM General: NAD. Sitting in chair Neck: JVP  8 cm, no thyromegaly or thyroid nodule.  Lungs: + crackles, decreased breath sounds at bases bilaterally.   CV: Nondisplaced PMI.  Heart regular S1/S2, no S3/S4, 2/6 HSM LLSB/apex. 1+ edema right ankle none left. Abdomen: Soft, nontender, no hepatosplenomegaly, no distention.  Neurologic: Alert and oriented x 3.  Psych: Normal affect. Extremities: No clubbing or cyanosis.   TELEMETRY: Reviewed telemetry pt in NSR with BiV pacing    Assessment:   1. 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  2. Acute on chronic  systolic heart failure: EF 30-35% by last echo.  3. Acute bronchitis 4. CKD, stage III-IV 5. PAF in NSR on amio - CHDSVASC 5. On warfarin 6. Multifocal airspace disease: ?amiodarone toxicity versus hypersensitivity pneumonitis vs aspiration PNA.   7. TIA 5/7, again 5/10.  History of right CEA and left tight carotid stenosis, unable to do carotid stent in past.  However, was evaluated by vascular here and had another carotid doppler showing < 40% stenosis at right CEA site, 40-60% stenosis on left.  The left-sided weakness with his TIAs does not correspond to his left carotid.   Plan/Discussion:  Yesterday HF meds held with new onset left sided weakness at that request of neurology. Volume status ok. Creatinine down a little. Continue to hold torsemidel, hydralazine, and imdur. No ACEI with CKD. Discussed with neurology ok to add low dose carvedilol. Will restart 3.125 mg carvedilol twice a day.   Workup of airspace disease per pulmonary.  He is now off amiodarone for ?lung toxicity.  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  Prednisone dosing per pulmonary.    TIA on 5/7 and again 5/10. He has PAF but has been on heparin gtt while INR subtherapeutic.  He had a tight left carotid stenosis historically, but as above was seen by vascular and had carotids with 40-60% LICA stenosis that does not correspond with left-sided weakness.  He is now on heparin/coumadin and Plavix.  Yesterday had had intermittent left sided weakness and confusion 15 minutes with another episode. EEG ok. CT of head no charge from 5/8. No acute infacts noted. Back on coumadin. INR 2.18     CLEGG,AMY NP-C  08/06/2014 12:53 PM      Patient seen with NP, agree with the above note.  PT notes right-sided weakness during walk but Timothy Durham says he did not have any weakness and was having trouble maneuvering the walker. Timothy Durham's breathing is  improved.  BP running higher now.  CT head yesterday without acute change.  INR > 2 now so off heparin gtt.  Per pulmonary, plan to continue prednisone x 6 wks.  I will restart his torsemide tomorrow.  Leave off hydralazine/Imdur to run higher blood pressure.  Restarted low dose Coreg.  Possible discharge to rehab tomorrow.   Marca Ancona 08/06/2014 2:23 PM

## 2014-08-06 NOTE — Progress Notes (Signed)
ANTICOAGULATION CONSULT NOTE - Follow Up Consult  Pharmacy Consult for Heparin and Coumadin Indication: atrial fibrillation and CVA  Patient Measurements: Height: 5\' 10"  (177.8 cm) Weight: 142 lb 3.2 oz (64.501 kg) (Scale B) IBW/kg (Calculated) : 73 Heparin Dosing Weight: 67 kg  Vital Signs: Temp: 98.3 F (36.8 C) (05/11 0536) Temp Source: Oral (05/11 0536) BP: 116/85 mmHg (05/11 0536) Pulse Rate: 69 (05/11 0536)  Labs:  Recent Labs  08/04/14 0352 08/05/14 0505 08/06/14 0545  HGB 9.9* 9.7* 9.8*  HCT 29.8* 28.9* 29.3*  PLT 440* 415* 419*  LABPROT 17.2* 19.1* 24.4*  INR 1.39 1.59* 2.18*  HEPARINUNFRC 0.46 0.54 0.30  CREATININE 1.81* 2.00* 1.82*    Estimated Creatinine Clearance: 29 mL/min (by C-G formula based on Cr of 1.82).  Assessment:   81 yr old male on Coumadin prior to admission for PAF.    Home Coumadin regimen: 2 mg daily. INR 2.99 on admit 4/24.   Coumadin and Plavix held on 5/2 for possible bronchoscopy.   Heparin begun on 5/4 when INR down to 1.96.   INR = 2.18, CBC stable   Goal of Therapy:  INR 2-3 Heparin level 0.3-0.7 units/ml Monitor platelets by anticoagulation protocol: Yes   Plan:   DC heparin  Coumadin 2 mg po daily (back on home dose with INR follow up Friday if discharged)  Continue daily PT/INR and CBC.  Thank you. Okey Regal, PharmD [phone]  08/06/2014,9:59 AM

## 2014-08-07 ENCOUNTER — Telehealth: Payer: Self-pay

## 2014-08-07 ENCOUNTER — Ambulatory Visit: Payer: Medicare HMO | Admitting: Pulmonary Disease

## 2014-08-07 LAB — BASIC METABOLIC PANEL
Anion gap: 11 (ref 5–15)
BUN: 58 mg/dL — ABNORMAL HIGH (ref 6–20)
CALCIUM: 9 mg/dL (ref 8.9–10.3)
CO2: 33 mmol/L — AB (ref 22–32)
CREATININE: 1.59 mg/dL — AB (ref 0.61–1.24)
Chloride: 93 mmol/L — ABNORMAL LOW (ref 101–111)
GFR calc Af Amer: 45 mL/min — ABNORMAL LOW (ref >60)
GFR calc non Af Amer: 39 mL/min — ABNORMAL LOW (ref >60)
GLUCOSE: 107 mg/dL — AB (ref 65–99)
Potassium: 3.9 mmol/L (ref 3.5–5.1)
SODIUM: 137 mmol/L (ref 135–145)

## 2014-08-07 LAB — PROTIME-INR
INR: 2.49 — ABNORMAL HIGH (ref 0.00–1.49)
Prothrombin Time: 27.1 seconds — ABNORMAL HIGH (ref 11.6–15.2)

## 2014-08-07 MED ORDER — PREDNISONE 10 MG PO TABS: 10.0000 mg | ORAL_TABLET | ORAL | Status: DC

## 2014-08-07 MED ORDER — TORSEMIDE 20 MG PO TABS
40.0000 mg | ORAL_TABLET | Freq: Every day | ORAL | Status: DC
Start: 2014-08-07 — End: 2014-08-28

## 2014-08-07 MED ORDER — CARVEDILOL 3.125 MG PO TABS: 3.1250 mg | ORAL_TABLET | Freq: Two times a day (BID) | ORAL | Status: DC

## 2014-08-07 MED ORDER — TAMSULOSIN HCL 0.4 MG PO CAPS: 0.8000 mg | ORAL_CAPSULE | Freq: Every day | ORAL | Status: AC

## 2014-08-07 NOTE — Telephone Encounter (Signed)
Pt just released from hospital.   lmtcb X1, pt only needs to be scheduled for HFU with BQ or TP.

## 2014-08-07 NOTE — Progress Notes (Signed)
Pt d/c'd to [facility]. Report given to staff nurse. Pt to be transported by wife. D/c paperwork given to wife

## 2014-08-07 NOTE — Progress Notes (Signed)
STROKE TEAM PROGRESS NOTE   SUBJECTIVE (INTERVAL HISTORY) No family is at the bedside.  He feels much better. No more episodes. His BP in 120s with low dose coreg and torsemide. Wold like to keep his BP around 120s to 130s.   OBJECTIVE Temp:  [97.1 F (36.2 C)-98.2 F (36.8 C)] 98.2 F (36.8 C) (05/12 0434) Pulse Rate:  [68-79] 70 (05/12 0434) Cardiac Rhythm:  [-] A-V Sequential paced (05/11 2105) Resp:  [18] 18 (05/12 0434) BP: (122-130)/(32-40) 126/36 mmHg (05/12 0434) SpO2:  [93 %-100 %] 94 % (05/12 0434) Weight:  [144 lb (65.318 kg)] 144 lb (65.318 kg) (05/12 0434)  No results for input(s): GLUCAP in the last 168 hours.  Recent Labs Lab 08/03/14 0318 08/04/14 0352 08/05/14 0505 08/06/14 0545 08/07/14 0522  NA 136 137 137 139 137  K 4.3 3.9 3.7 3.8 3.9  CL 93* 92* 93* 94* 93*  CO2 35* 36* 33* 35* 33*  GLUCOSE 133* 97 167* 88 107*  BUN 61* 69* 68* 70* 58*  CREATININE 1.84* 1.81* 2.00* 1.82* 1.59*  CALCIUM 8.9 8.8* 8.7* 9.0 9.0   No results for input(s): AST, ALT, ALKPHOS, BILITOT, PROT, ALBUMIN in the last 168 hours.  Recent Labs Lab 08/02/14 0415 08/03/14 0318 08/04/14 0352 08/05/14 0505 08/06/14 0545  WBC 14.1* 11.8* 11.8* 12.0* 15.7*  HGB 9.3* 9.5* 9.9* 9.7* 9.8*  HCT 27.4* 28.3* 29.8* 28.9* 29.3*  MCV 94.8 93.4 94.0 93.2 93.9  PLT 384 412* 440* 415* 419*   No results for input(s): CKTOTAL, CKMB, CKMBINDEX, TROPONINI in the last 168 hours.  Recent Labs  08/05/14 0505 08/06/14 0545 08/07/14 0522  LABPROT 19.1* 24.4* 27.1*  INR 1.59* 2.18* 2.49*   No results for input(s): COLORURINE, LABSPEC, PHURINE, GLUCOSEU, HGBUR, BILIRUBINUR, KETONESUR, PROTEINUR, UROBILINOGEN, NITRITE, LEUKOCYTESUR in the last 72 hours.  Invalid input(s): APPERANCEUR     Component Value Date/Time   CHOL 147 08/03/2014 1630   TRIG 63 08/03/2014 1630   HDL 54 08/03/2014 1630   CHOLHDL 2.7 08/03/2014 1630   VLDL 13 08/03/2014 1630   LDLCALC 80 08/03/2014 1630   Lab  Results  Component Value Date   HGBA1C 6.2* 08/03/2014      Component Value Date/Time   LABOPIA NONE DETECTED 03/26/2013 1408   COCAINSCRNUR NONE DETECTED 03/26/2013 1408   LABBENZ NONE DETECTED 03/26/2013 1408   AMPHETMU NONE DETECTED 03/26/2013 1408   THCU NONE DETECTED 03/26/2013 1408   LABBARB NONE DETECTED 03/26/2013 1408    No results for input(s): ETH in the last 168 hours.  I have personally reviewed the radiological images below and agree with the radiology interpretations.  Ct Head Wo Contrast 08/05/14 Atrophy with prior infarcts as noted above. No acute infarct apparent. No hemorrhage or mass effect. No change from recent prior study.  08/04/14 - Remote infarctions as detailed above. No evidence of new infarct or of infarct extension. Moderate chronic atrophy.  08/02/2014   IMPRESSION: 1. No acute abnormality. 2. Stable old infarcts, as described above. 3. Stable mild to moderate diffuse cerebral atrophy. These results were called by telephone at the time of interpretation on 08/02/2014 at 9:32 am to Dr. Thad Ranger, who verbally acknowledged these results.    Dg Chest Port 1 View  08/01/2014   IMPRESSION: Persisting bilateral airspace disease, which is unchanged since the most recent plain films though has improved since the comparisons of 07/20/2014.  Persisting small pleural effusions.    Carotid Doppler  Right = 1-39% ICA stenosis. Left =  40-59% ICA stenosis, based on diastolic velocity, however, based on systolic velocity and ratio, the range is 60-79%.  EEG normal   2D Echocardiogram  - EF 25-30%, Global hypokinesis and inferior lateral akinesis; overall severely reduced LV function; grade 2 diastolic dysfunction with elevated LV filling pressure; bioprosthetic aortic valve; mild AI; moderate LAE; mild MR and TR; mildly elevated pulmonary pressure.  PHYSICAL EXAM  Temp:  [97.1 F (36.2 C)-98.2 F (36.8 C)] 98.2 F (36.8 C) (05/12 0434) Pulse Rate:  [68-79]  70 (05/12 0434) Resp:  [18] 18 (05/12 0434) BP: (122-130)/(32-40) 126/36 mmHg (05/12 0434) SpO2:  [93 %-100 %] 94 % (05/12 0434) Weight:  [144 lb (65.318 kg)] 144 lb (65.318 kg) (05/12 0434)  General - Well nourished, well developed, in no apparent distress.  Ophthalmologic - Sharp disc margins OU.  Cardiovascular - Regular rate and rhythm with no murmur.  Mental Status -  Level of arousal and orientation to time, place, and person were intact. Language including expression, naming, repetition, comprehension was assessed and found intact. Fund of Knowledge was assessed and was intact.  Cranial Nerves II - XII - II - Visual field intact OU. III, IV, VI - Extraocular movements intact. V - Facial sensation intact bilaterally. VII - Facial movement intact bilaterally. VIII - Hearing & vestibular intact bilaterally. X - Palate elevates symmetrically. XI - Chin turning & shoulder shrug intact bilaterally. XII - Tongue protrusion intact.  Motor Strength - The patient's strength was normal in all extremities and pronator drift was absent.  Bulk was normal and fasciculations were absent.   Motor Tone - Muscle tone was assessed at the neck and appendages and was normal.  Reflexes - The patient's reflexes were symmetrical in all extremities and he had no pathological reflexes.  Sensory - Light touch, temperature/pinprick were assessed and were symmetrical.    Coordination - The patient had normal movements in the hands with no ataxia or dysmetria.  Tremor was absent.  Gait and Station - deferred due to safety concerns.   ASSESSMENT/PLAN [name] is a 81 y.o. male with history of CAD and the STEMI in 2015, HTN, CKD, HLD, CHF with EF 30-35%, s/p AVR and pacemaker, pAfib on Coumadin, PAD s/p stent on plavix, stroke in 2000, 2007, and a TIA in 2014, s/p right CEA with left body stenosis admitted for pneumonia and fluid overload. His Coumadin and Plavix was on hold for bronchoscope,  but later resumed Coumadin with IV heparin bridge. 08/02/14 morning, he went to bathroom, felt dizzy and fell, found to have difficulty getting words out, left facial and left arm weakness, episodic, wax and wane, seems also involving right hand weakness and numbness. Symptoms now resolved. Plavix resumed 08/03/14.  Stroke/TIA:  Pt had 2 more left sided hemiparesis and slurry speech 08/05/14. His episode can certainly be due to paroxysmal A. fib, however he was on heparin drip and therapeutic heparin levels. He has carotid artery stenosis s/p right CEA, and the left ICA stenosis but not fit to the localization. Vascular surgery recommended no intervention. BP mildly low at the time of episodes, would recommend avoid hypotension and BP goal 120-140.   MRI  MRA not able to perform due to pacer  CT showed old bilateral infarcts but no acute abnormality  repeat CT head no new infarct and no bleeding  Carotid Doppler - Left = 40-59% ICA stenosis, based on diastolic velocity, however, based on systolic velocity and ratio, the range is 60-79%.  2D  Echo  EF 25-30%  LDL 80  HgbA1c 6.2`  TCD pending report  Heparin drip for VTE prophylaxis  Diet Heart Room service appropriate?: Yes; Fluid consistency:: Thin   clopidogrel 75 mg orally every day and warfarin prior to admission, now on clopidogrel 75 mg orally every day, warfarin and heparin  Patient counseled to be compliant with his antithrombotic medications  Ongoing aggressive stroke risk factor management  Paroxysmal A. Fib  Off heparin drip as INR therapeutic  On Coumadin  INR subtherapeutic 1.36->1.39->1.59->2.18->2.49  Carotid stenosis  S/p right CEA  Carotid Doppler  Left = 40-59% ICA stenosis, based on diastolic velocity, however, based on systolic velocity and ratio, the range is 60-79%.  On plavix  Vascular surgery recommended no intervention, repeat carotid Doppler in 1 year  Follow-up with Dr. Arbie Cookey  BP control  Avoid  hypotension due to ICA stenosis  Episodes seem to be controlled with better BP  Recommend BP goal 120-140  OK with resume low dose coreg and torsemide  Close monitoring  CAD with STEMI in 2015  Cardiology is following  On Plavix and torsemide  EF 25-30%  On coumadin too  Hyperlipidemia  Home meds:  Lipitor 40   Currently on Lipitor 40  LDL 80, goal < 70  Continue statin at discharge  Other Stroke Risk Factors  Advanced age  Hx stroke/TIA in 2000, 2007, and 2014  Coronary artery disease  PAD s/p left femoral stenting  Other Active Problems  INR subtherapeutic  Chronic kidney disease  Anemia  Leukocytosis, improving  Other Pertinent History  S/p AVR  Pacemaker in place  Hospital day # 18  Neurology will sign off. Please call with questions. Pt will follow up with Dr. Roda Shutters at [facility] in about 2 months. Thanks for the consult.   Marvel Plan, MD PhD Stroke Neurology 08/07/2014 9:00 AM    To contact Stroke Continuity provider, please refer to [email or web address]. After hours, contact General Neurology

## 2014-08-07 NOTE — Clinical Social Work Placement (Signed)
   CLINICAL SOCIAL WORK PLACEMENT  NOTE  Date:  08/07/2014  Patient Details  Name: Timothy Durham MRN: [id number] Date of Birth: 09-21-1930  Clinical Social Work is seeking post-discharge placement for this patient at the Skilled  Nursing Facility level of care (*CSW will initial, date and re-position this form in  chart as items are completed):  Yes   Patient/family provided with [facility] Clinical Social Work Department's list of facilities offering this level of care within the geographic area requested by the patient (or if unable, by the patient's family).  Yes   Patient/family informed of their freedom to choose among providers that offer the needed level of care, that participate in Medicare, Medicaid or managed care program needed by the patient, have an available bed and are willing to accept the patient.  Yes   Patient/family informed of [facility]'s ownership interest in [facility] and [facility], as well as of the fact that they are under no obligation to receive care at these facilities.  PASRR submitted to EDS on 08/06/14     PASRR number received on 08/06/14     Existing PASRR number confirmed on       FL2 transmitted to all facilities in geographic area requested by pt/family on       FL2 transmitted to all facilities within larger geographic area on 08/06/14     Patient informed that his/her managed care company has contracts with or will negotiate with certain facilities, including the following:   ([employer] Medicare- not [employer]- requires pre-authorization)     Yes   Patient/family informed of bed offers received.  Patient [facility] bed at [facility]     Physician recommends and patient chooses bed at      Patient to be transferred to [facility] on 08/07/14.  Patient to be transferred to facility by [facility]- with wife       Patient family notified on 08/07/14 of transfer.  Name of family member notified:  Hydrologist- Wife      PHYSICIAN Please prepare priority discharge summary, including medications, Please sign FL2, Please sign DNR, Please prepare prescriptions     Additional Comment: Clinical Social Worker facilitated patient discharge including contacting patient family and facility to confirm patient discharge plans.  Clinical information faxed to facility and family agreeable with plan. Wife requested to transport via car to [facility]; patient agreed.  RN to call report prior to discharge. Patient noted to be in much better spirits today- sitting up in chair and smiling as CSW entered the room.  Patient stated "I have a new attitude and I'm ready to go get some rehab."  He is in full agreement to SNF placement.  No further CSW needs identified. CSW signing off.  Lorri Frederick. Andria Rhein [phone]         _______________________________________________ Darylene Price, LCSW 08/07/2014, 9:11 PM

## 2014-08-07 NOTE — Progress Notes (Signed)
ANTICOAGULATION CONSULT NOTE - Follow Up Consult  Pharmacy Consult for Heparin and Coumadin Indication: atrial fibrillation and CVA  Patient Measurements: Height: 5\' 10"  (177.8 cm) Weight: 144 lb (65.318 kg) (scale B) IBW/kg (Calculated) : 73 Heparin Dosing Weight: 67 kg  Vital Signs: Temp: 98.2 F (36.8 C) (05/12 0434) Temp Source: Oral (05/12 0434) BP: 126/36 mmHg (05/12 0434) Pulse Rate: 70 (05/12 0434)  Labs:  Recent Labs  08/05/14 0505 08/06/14 0545 08/07/14 0522  HGB 9.7* 9.8*  --   HCT 28.9* 29.3*  --   PLT 415* 419*  --   LABPROT 19.1* 24.4* 27.1*  INR 1.59* 2.18* 2.49*  HEPARINUNFRC 0.54 0.30  --   CREATININE 2.00* 1.82* 1.59*    Estimated Creatinine Clearance: 33.7 mL/min (by C-G formula based on Cr of 1.59).  Assessment:   81 yr old male on Coumadin prior to admission for PAF.    Home Coumadin regimen: 2 mg daily. INR 2.99 on admit 4/24.   Coumadin and Plavix held on 5/2 for possible bronchoscopy.   Heparin stopped 5/11 since INR > 2   INR = 2.49, CBC stable   Goal of Therapy:  INR 2-3 Monitor platelets by anticoagulation protocol: Yes   Plan:   Coumadin 2 mg po daily (back on home dose with INR follow up Friday if discharged)  Continue daily PT/INR and CBC.  Tad Moore, BCPS  Clinical Pharmacist Pager [phone]  08/07/2014 1:30 PM

## 2014-08-07 NOTE — Telephone Encounter (Signed)
-----   Message from Lupita Leash, MD sent at 07/31/2014 12:09 PM EDT ----- A, Please arrange HFU for me or TP 2 weeks Thanks B

## 2014-08-07 NOTE — Discharge Summary (Addendum)
Physician Discharge Summary  Timothy Durham GMW:[id number] DOB: 08-03-1930 DOA: 07/19/2014  PCP: Dennis Bast, MD  Admit date: 07/19/2014 Discharge date: 08/07/2014  Time spent: >35  minutes  Recommendations for Outpatient Follow-up:  1. Keep BP around 120s to 130s. 2. Maintain INR goal 2-3  Discharge Diagnoses:  Principal Problem:   Acute respiratory failure Active Problems:   TIA 2007, Dec 2014 (Plavix added)   Coronary artery disease-moderate Oct 2015   CKD (chronic kidney disease), stage III   Moderate to Severe Mitral Regurgitation   PAD- LLE PTA, RCEA, attempted LICA stent   Tissue AVR 2006 (in [place])   NICM (nonischemic cardiomyopathy)   PAF   Hyperlipidemia   Chronic anticoagulation   Pacemaker- MDT BiV 02/17/14   Acute on chronic systolic CHF (congestive heart failure)   Chronic systolic heart failure   Acute renal failure superimposed on stage 3 chronic kidney disease   Pneumonia, organism unspecified   Acute respiratory failure with hypoxia   Abnormal CT scan, chest   Diarrhea   Amiodarone pulmonary toxicity   Aphasia   Carotid stenosis   S/P carotid endarterectomy   Pulmonary infiltrates   Left arm weakness   AKI (acute kidney injury)   Discharge Condition: stable  Diet recommendation: heart healthy  Filed Weights   08/05/14 0606 08/06/14 0536 08/07/14 0434  Weight: 64.048 kg (141 lb 3.2 oz) 64.501 kg (142 lb 3.2 oz) 65.318 kg (144 lb)    History of present illness:  81 year old male with history of nonischemic cardiomyopathy with EF of 30-35% as per last echo, NSTEMI in 2015 with cardiac cath showing nonobstructive CAD, chronic kidney disease stage III, history of stroke in 2000, history of bioprosthetic aVR, paroxysmal A. fib on chronic Coumadin, tobacco use with? COPD who presented with progressive shortness of breath for the past 3 days associated with nonproductive cough and wheezing. He reports being compliant with his medications. Chest x-ray on admission  showing moderate vascular congestion. Labs showed elevated BNP of 1400. Patient admitted for acute hypoxic respiratory failure. He underwent extensive evaluation by pulmonary, ID and cardiology. At this time his hypoxia is felt to be due to organizing pneumonia from amiodarone which has been discontinued. Started on slow prednisone taper with plans for outpatient follow-up. Hypoxia is improving. On 5/7, patient developed stroke/TIA like presentation and stroke service is consulting. DC home when INR therapeutic >2.  Hospital Course:   Acute hypoxic respiratory failure - Initially felt to be due to infectious etiology/pneumonia or bronchitis. - Eventually presumed to be due to organizing pna 2ary to Amiodarone toxicity + CHF: Recommendations are to hold amiodarone indefinitely, slow prednisone taper, wean oxygen for >88%.  - Improving.   Non-ischemic cardiomyopathy/acute on chronic systolic CHF - addendum: discharging on coreg and torsemide  - Not on ACEi/ ARB due to CKD - Advanced heart failure team followed while patient in house.   Acute on chronic kidney disease stage III - Baseline creatinine around 1.8-2.  - [place] abd shows medical renal ds.  - continue to monitor serum creatinine  History of CVA and peripheral vascular disease/new CVA versus TIA on 5/7 - Stroke team on board and recommended the following:  His episode can certainly be due to paroxysmal A. fib, however he was on heparin drip and therapeutic heparin levels. He has carotid artery stenosis s/p right CEA, and the left ICA stenosis but not fit to the localization. Vascular surgery recommended no intervention. BP mildly low at the time of episodes, would recommend avoid  hypotension and BP goal 120-140.   MRI MRA not able to perform due to pacer  CT showed old bilateral infarcts but no acute abnormality  repeat CT head no new infarct and no bleeding  Carotid Doppler - Left = 40-59% ICA stenosis, based on diastolic  velocity, however, based on systolic velocity and ratio, the range is 60-79%.  2D Echo EF 25-30%  LDL 80  HgbA1c 6.2`  TCD pending report  Heparin drip for VTE prophylaxis  Diet Heart Room service appropriate?: Yes; Fluid consistency:: Thin   clopidogrel 75 mg orally every day and warfarin prior to admission, now on clopidogrel 75 mg orally every day, warfarin and heparin  Patient counseled to be compliant with his antithrombotic medications  Ongoing aggressive stroke risk factor management  Atrial fibrillation - Biventricular paced rhythm - Amiodarone discontinued 5/2 - Anticoagulation on warfarin - CHDSVASC 5  GERD Continue PPI  Malnutrition,  - low albumin, nutrition consult obtained.  BPH/urinary retention  -increased flomax dose on discharge  Chronic anemia - Stable  Leukocytosis - most likely related to steroids  Diarrhea - Resolved. C. difficile PCR negative.  Fall on 5/7 & 5/9 - No overt injuries. - CT head negative  Carotid stenosis - Status post right CEA - Left high-grade ICA stenosis but failed stenting - Doppler carotid pending - Vascular surgery consultation appreciated: Do not recommend any further intervention at this time. Recommend repeating carotid duplex in 1 year for follow-up.  Essential hypertension - Controlled  Hyperlipidemia - Continue statins   Procedures:  MRI MRA not able to perform due to pacer  CT showed old bilateral infarcts but no acute abnormality  repeat CT head no new infarct and no bleeding  Carotid Doppler - Left = 40-59% ICA stenosis, based on diastolic velocity, however, based on systolic velocity and ratio, the range is 60-79%.  2D Echo EF 25-30%  LDL 80  HgbA1c 6.2  Consultations:  Neurology  ID  Critical care  Cardiology HF team  Discharge Exam: Filed Vitals:   08/07/14 0434  BP: 126/36  Pulse: 70  Temp: 98.2 F (36.8 C)  Resp: 18    General: Pt in nad, alert and  awake Cardiovascular: rrr, no mrg Respiratory: cta bl, no wheezes  Discharge Instructions   Discharge Instructions    Ambulatory referral to Neurology    Complete by:  As directed   Pt will follow up with Dr. Roda Shutters at [facility] in about 2 months. Thanks.     Call MD for:  difficulty breathing, headache or visual disturbances    Complete by:  As directed      Call MD for:  temperature >100.4    Complete by:  As directed      Diet - low sodium heart healthy    Complete by:  As directed      Discharge instructions    Complete by:  As directed   Please f/u with neurologist in 2 months  F/u with your pcp in 1-2 weeks or sooner should any new concerns arise.     Increase activity slowly    Complete by:  As directed           Current Discharge Medication List    START taking these medications   Details  predniSONE (DELTASONE) 10 MG tablet Take 1 tablet (10 mg total) by mouth as directed. Take 4 tabs orally for the next 4 days, then take 3 tabs orally for the next 4 days, then take 2  tabs orally for the next 4 days, then take 1 tab orally for the next 4 days. Qty: 30 tablet, Refills: 0      CONTINUE these medications which have CHANGED   Details  carvedilol (COREG) 3.125 MG tablet Take 1 tablet (3.125 mg total) by mouth 2 (two) times daily with a meal. Qty: 60 tablet, Refills: 0    tamsulosin (FLOMAX) 0.4 MG CAPS capsule Take 2 capsules (0.8 mg total) by mouth at bedtime. Qty: 60 capsule, Refills: 0    torsemide (DEMADEX) 20 MG tablet Take 2 tablets (40 mg total) by mouth daily. Take 40 mg in am and 20 mg in pm Qty: 60 tablet, Refills: 0      CONTINUE these medications which have NOT CHANGED   Details  acetaminophen (TYLENOL) 500 MG tablet Take 1,000 mg by mouth at bedtime.     allopurinol (ZYLOPRIM) 100 MG tablet Take 200 mg by mouth daily.    atorvastatin (LIPITOR) 40 MG tablet Take 40 mg by mouth every evening.    Calcium Carb-Cholecalciferol (CALCIUM 600 + D PO) Take 2  tablets by mouth every morning.    clopidogrel (PLAVIX) 75 MG tablet Take 75 mg by mouth daily.    DULoxetine (CYMBALTA) 60 MG capsule Take 60 mg by mouth daily.     Omega-3 Fatty Acids (FISH OIL) 1200 MG CAPS Take 1 capsule by mouth every morning.    omeprazole (PRILOSEC) 40 MG capsule Take 40 mg by mouth every morning.    potassium chloride SA (K-DUR,KLOR-CON) 20 MEQ tablet Take 1 tablet (20 mEq total) by mouth as needed. TAKE ONLY WHEN YOU TAKE METOLAZONE Qty: 30 tablet, Refills: 3    warfarin (COUMADIN) 4 MG tablet Take 2 mg by mouth daily at 6 PM.       STOP taking these medications     amiodarone (PACERONE) 200 MG tablet      hydrALAZINE (APRESOLINE) 25 MG tablet      isosorbide mononitrate (IMDUR) 30 MG 24 hr tablet      metolazone (ZAROXOLYN) 5 MG tablet      spironolactone (ALDACTONE) 25 MG tablet        Allergies  Allergen Reactions  . Ambien [Zolpidem] Other (See Comments)    Hallucinations  . Ativan [Lorazepam] Other (See Comments)    hallucinations  . Oxycontin [Oxycodone] Other (See Comments)    hallucinations  . Penicillins     Unknown childhood reaction   . Sulfa Antibiotics     unknown   Follow-up Information    Follow up with Dennis Bast, MD.   Specialty:  Internal Medicine   Why:  Appointment on June 1st @ 10;30am please arrive early 10;15 am   Contact information:   [address] [place] [address] [phone]       Follow up with Oretha Milch., MD On 08/14/2014.   Specialty:  Pulmonary Disease   Why:  3-30 pm at the [facility] office   Contact information:   [address] [place] [address] [phone]       Follow up with Xu,Jindong, MD. Schedule an appointment as soon as possible for a visit in 2 months.   Specialty:  Neurology   Why:  stroke clinicAppointment Aug 10th @ 11;15am   Contact information:   [address] [place] [address] [phone]       Follow up with Marca Ancona, MD On  08/15/2014.   Specialty:  Cardiology   Why:  At 11:40 am in the [facility]--gate code [id number]--Please bring all medications to appt   Contact information:   [address] [place] [address] [phone]        The results of significant diagnostics from this hospitalization (including imaging, microbiology, ancillary and laboratory) are listed below for reference.    Significant Diagnostic Studies: Dg Chest 2 View  08/04/2014   CLINICAL DATA:  Short of breath. Weakness. History of pulmonary fibrosis.  EXAM: CHEST  2 VIEW  COMPARISON:  08/01/2014.  FINDINGS: Changes from cardiac surgery are stable. Cardiac silhouette is mildly enlarged. No mediastinal or hilar masses.  Left upper lobe airspace opacity noted previously has improved.  Areas of coarse reticular type opacity in both lungs and at both lung bases is stable consistent with scarring.  No pulmonary edema. Minimal left pleural effusions suggested. No pneumothorax.  IMPRESSION: 1. Improved left upper lobe pneumonia. 2. No new abnormalities.  Persistent areas of lung scarring.   Electronically Signed   By: Amie Portland M.D.   On: 08/04/2014 10:10   Dg Chest 2 View  07/31/2014   CLINICAL DATA:  Acute respiratory failure with hypoxia. Pt hx COPD, CAD  EXAM: CHEST  2 VIEW  COMPARISON:  07/30/2014  FINDINGS: Patchy areas of airspace and interstitial opacities in the lungs are stable from most recent prior exam. No new lung abnormalities. No significant pleural effusion and no pneumothorax.  Changes from cardiac surgery and aortic valve replacement are stable. Cardiac silhouette is mildly enlarged. No mediastinal or hilar masses. Left anterior chest wall sequential pacemaker is stable and well positioned.  IMPRESSION: No change from the previous day's study. Bilateral patchy airspace and interstitial lung opacities are stable. No new abnormalities.   Electronically Signed   By: Amie Portland M.D.   On: 07/31/2014  13:02   Dg Chest 2 View  07/30/2014   CLINICAL DATA:  Hypoxia.  Weakness.  EXAM: CHEST  2 VIEW  COMPARISON:  07/28/2014.  FINDINGS: There is improvement from 07/28/2014, with partial clearance of the multifocal bilateral airspace opacities although significant opacities persist, left greater than right. Mild right lateral base pleural thickening persists. No significant effusion is evident. There is moderate unchanged cardiomegaly. There is prior sternotomy and aortic valvuloplasty. There are intact appearances of the transvenous leads.  IMPRESSION: Slight improvement. Multifocal bilateral airspace opacities persist, partially cleared.   Electronically Signed   By: Ellery Plunk M.D.   On: 07/30/2014 06:50   Dg Chest 2 View  07/28/2014   CLINICAL DATA:  Respiratory failure .  EXAM: CHEST  2 VIEW  COMPARISON:  07/22/2014 .  CT 07/25/2014.  FINDINGS: Mediastinum and hilar structures are normal. Prior cardiac valve replacement. Cardiac pacer noted with lead tips in stable position. Persistent multifocal bilateral pulmonary infiltrates, no interim change. Small right pleural effusion. No pneumothorax.  IMPRESSION: 1. Persistent multifocal bilateral pulmonary infiltrates most consistent with pneumonia. No significant interim change.  2. Prior cardiac valve replacement. Cardiac pacer stable position. Stable cardiomegaly .   Electronically Signed   By: Maisie Fus  Register   On: 07/28/2014 08:33   Dg Chest 2 View  07/20/2014   CLINICAL DATA:  Shortness of breath.  Pacemaker.  EXAM: CHEST  2 VIEW  COMPARISON:  05/04/2014.  FINDINGS: Mildly increased cardiac silhouette. Triple lead pacer unchanged from priors. Moderate vascular congestion without overt failure or significant consolidation. Mild to moderate pleural effusions, greater on the RIGHT. Aortic valvular placement with prior median  sternotomy. No acute osseous findings.  IMPRESSION: Cardiomegaly with stable appearance of the transvenous pacer.  Moderate  vascular congestion without overt failure. Overall improved from prior radiograph in February.   Electronically Signed   By: Davonna Belling M.D.   On: 07/20/2014 02:23   Ct Head Wo Contrast  08/05/2014   CLINICAL DATA:  Intermittent left-sided weakness and difficulty speaking for 1 day  EXAM: CT HEAD WITHOUT CONTRAST  TECHNIQUE: Contiguous axial images were obtained from the base of the skull through the vertex without intravenous contrast.  COMPARISON:  Aug 03, 2014  FINDINGS: Moderate generalized atrophy is stable. There is no intracranial mass, hemorrhage, extra-axial fluid collection, or midline shift. There is evidence of a prior infarct in the right frontal -parietal junction region with some involvement of the superior right temporal lobe as well, stable. There is evidence of a prior inferior, medial right occipital lobe infarct, stable. There is evidence of a prior infarct just superior to the superior most aspect the left lateral ventricle, stable. There is no new gray-white compartment lesion. No acute infarct apparent. Bony calvarium appears intact. The mastoid air cells clear.  IMPRESSION: Atrophy with prior infarcts as noted above. No acute infarct apparent. No hemorrhage or mass effect. No change from recent prior study.   Electronically Signed   By: Bretta Bang III M.D.   On: 08/05/2014 13:52   Ct Head Wo Contrast  08/04/2014   CLINICAL DATA:  Episode yesterday of difficulty word finding, left facial on left arm weakness. This was followed by right upper extremity weakness and paresthesias.  EXAM: CT HEAD WITHOUT CONTRAST  TECHNIQUE: Contiguous axial images were obtained from the base of the skull through the vertex without intravenous contrast.  COMPARISON:  CT 08/02/2014, MR 03/26/2013.  FINDINGS: There is no intracranial hemorrhage or evidence of acute infarction. There is no extra-axial fluid collection. There is encephalomalacia due to remote infarction in the right occipital, right  frontoparietal and left posterior frontal regions, these remote infarctions are unchanged from the prior MRI. No new infarction is evident. There is no mass, mass effect or midline shift. There is moderate generalized atrophy. No significant bony abnormality is evident. No interval change is evident from yesterday's CT.  IMPRESSION: Remote infarctions as detailed above. No evidence of new infarct or of infarct extension. Moderate chronic atrophy.   Electronically Signed   By: Ellery Plunk M.D.   On: 08/04/2014 02:24   Ct Head Wo Contrast  08/02/2014   CLINICAL DATA:  Right facial droop.  History of stroke.  EXAM: CT HEAD WITHOUT CONTRAST  TECHNIQUE: Contiguous axial images were obtained from the base of the skull through the vertex without intravenous contrast.  COMPARISON:  Head CT dated 03/25/2013 and brain MR dated 03/26/2013.  FINDINGS: Previously demonstrated in bold right occipital, right frontoparietal and small left posterior frontal infarcts are unchanged. Diffusely enlarged ventricles and subarachnoid spaces. No intracranial hemorrhage, mass lesion or CT evidence of acute infarction. Unremarkable bones and included paranasal sinuses.  IMPRESSION: 1. No acute abnormality. 2. Stable old infarcts, as described above. 3. Stable mild to moderate diffuse cerebral atrophy. These results were called by telephone at the time of interpretation on 08/02/2014 at 9:32 am to Dr. Thad Ranger, who verbally acknowledged these results.   Electronically Signed   By: Beckie Salts M.D.   On: 08/02/2014 09:32   Ct Chest Wo Contrast  07/25/2014   CLINICAL DATA:  Hypoxia and progressive shortness of breath for the past 3  days. Nonproductive cough and wheeze  EXAM: CT CHEST WITHOUT CONTRAST  TECHNIQUE: Multidetector CT imaging of the chest was performed following the standard protocol without IV contrast.  COMPARISON:  None.  FINDINGS: THORACIC INLET/BODY WALL:  There is a biventricular pacer from the left with leads in  unremarkable position. No acute findings at the thoracic inlet.  There is an incidental 1 cm intramuscular lipoma in the right chest wall.  MEDIASTINUM:  Cardiomegaly which is chronic based on chest x-rays. No pericardial effusion. The patient is status post median sternotomy with aortic valve replacement. The ascending aorta measures 41 mm in diameter. There is mild enlargement of mediastinal lymph nodes which could be reactive to the presumed pneumonia or sequela of remote granulomatous disease (there is also scattered nodal coarse calcification). No acute vascular findings. Negative esophagus.  LUNG WINDOWS:  There is patchy consolidative and ground-glass opacities in all lobes. Airspace disease is most extensive in the left upper lobe and most dense in the posterior segment right upper lobe. Small bilateral pleural effusions with interlobular septal thickening. No cavitation. No evidence of loculated effusion.  UPPER ABDOMEN:  Granulomatous changes in the liver and spleen.  OSSEOUS:  No acute findings.  IMPRESSION: 1. Multilobar pneumonia. 2. Superimposed interstitial edema and small pleural effusions. 3. Chronic and postoperative findings noted above.   Electronically Signed   By: Marnee Spring M.D.   On: 07/25/2014 08:00   Dg Esophagus  07/29/2014   CLINICAL DATA:  Dysphagia.  Choking.  Bilateral pneumonia.  EXAM: ESOPHOGRAM/BARIUM SWALLOW  TECHNIQUE: Combined double contrast and single contrast examination performed using effervescent crystals, thick barium liquid, and thin barium liquid.  FLUOROSCOPY TIME:  Radiation Exposure Index (as provided by the fluoroscopic device): Dose area product: 829.79uGy*m^2  COMPARISON:  Radiographs 07/28/2014.  Chest CT 07/25/2014.  FINDINGS: Pharyngeal phase of swallowing was normal. The study was carried out in the AP projection due to patient condition. Nonspecific esophageal dysmotility was present without stricture. 0/3 primary waves reach the gastroesophageal  junction. Tertiary contractions were observed. Fetal on esophagus incidentally noted.  There is no bear ear passage of the 13 mm barium tablet challenge however the tablet remain static in the lower esophagus just proximal to the gastroesophageal junction for several min. No gastroesophageal reflux could be elicited with water siphon test.  Esophageal stasis was present in the supine position. Esophageal folds appeared within normal limits.  IMPRESSION: 1. No esophageal stricture or mass. 2. Nonspecific esophageal dysmotility disorder with with tertiary contractions. Stasis in the supine position.   Electronically Signed   By: Andreas Newport M.D.   On: 07/29/2014 09:39   US Renal  07/20/2014   CLINICAL DATA:  Acute kidney injury.  Hypertension.  EXAM: RENAL/URINARY TRACT ULTRASOUND COMPLETE  COMPARISON:  None.  FINDINGS: Right Kidney:  Length: 10.3 cm. Echogenicity within normal limits. No mass or hydronephrosis visualized. Diffuse cortical thinning with prominent renal sinus fat.  Left Kidney:  Length: 11.1 cm. Echogenicity within normal limits. No mass or hydronephrosis visualized. Diffuse cortical thinning with prominent renal sinus fat.  Bladder:  Multiple diverticula. The largest measures 4.9 cm in maximum diameter. Small amount of internal debris with no definite calculi  IMPRESSION: 1. Diffuse bilateral renal cortical atrophy. 2. Multiple bladder diverticula. 3. Small amount of debris in the bladder with no definite calculi.   Electronically Signed   By: Beckie Salts M.D.   On: 07/20/2014 12:27   Dg Chest Port 1 View  08/01/2014   CLINICAL DATA:  81 year old male with a history of pulmonary infiltrates and shortness of breath.  EXAM: PORTABLE CHEST - 1 VIEW  COMPARISON:  Multiple prior, most recent 07/31/2014, 07/30/2014  FINDINGS: Cardiomediastinal silhouette unchanged.  Atherosclerosis.  Unchanged cardiac pacing device with 3 leads in place.  Bilateral airspace disease, involving the right greater  than left bases and the left greater than right apices. Distribution is similar to comparison plain film. Over the course of several chest x-ray this has improved, dating to 07/22/2014 and 07/20/2014.  Trace pleural effusions.  Calcified right hilar lymph nodes.  IMPRESSION: Persisting bilateral airspace disease, which is unchanged since the most recent plain films though has improved since the comparisons of 07/20/2014.  Persisting small pleural effusions.  Signed,  Yvone Neu. Loreta Ave, DO  [facility]  [facility]   Electronically Signed   By: Gilmer Mor D.O.   On: 08/01/2014 07:19   Dg Chest Port 1 View  07/22/2014   CLINICAL DATA:  Acute onset of hypoxemia.  Initial encounter.  EXAM: PORTABLE CHEST - 1 VIEW  COMPARISON:  Chest radiograph performed 07/20/2014  FINDINGS: The lungs are well-aerated. Worsening bilateral midlung airspace opacification raises concern for multifocal pneumonia. Small bilateral pleural effusions are seen, and underlying interstitial edema cannot be excluded. Vascular congestion is noted. No pneumothorax is seen.  The cardiomediastinal silhouette is mildly enlarged. The patient is status post median sternotomy. A pacemaker is noted overlying the left chest wall, with leads ending overlying the right atrium and right ventricle. No acute osseous abnormalities are seen.  IMPRESSION: 1. Worsening bilateral mid lung airspace opacification raises concern for multifocal pneumonia. 2. Small bilateral pleural effusions, with vascular congestion and mild cardiomegaly. Underlying interstitial edema cannot be excluded.   Electronically Signed   By: Roanna Raider M.D.   On: 07/22/2014 04:30    Microbiology: Recent Results (from the past 240 hour(s))  Clostridium Difficile by PCR     Status: None   Collection Time: 07/30/14 12:51 PM  Result Value Ref Range Status   C difficile by pcr NEGATIVE NEGATIVE Final     Labs: Basic Metabolic  Panel:  Recent Labs Lab 08/03/14 0318 08/04/14 0352 08/05/14 0505 08/06/14 0545 08/07/14 0522  NA 136 137 137 139 137  K 4.3 3.9 3.7 3.8 3.9  CL 93* 92* 93* 94* 93*  CO2 35* 36* 33* 35* 33*  GLUCOSE 133* 97 167* 88 107*  BUN 61* 69* 68* 70* 58*  CREATININE 1.84* 1.81* 2.00* 1.82* 1.59*  CALCIUM 8.9 8.8* 8.7* 9.0 9.0   Liver Function Tests: No results for input(s): AST, ALT, ALKPHOS, BILITOT, PROT, ALBUMIN in the last 168 hours. No results for input(s): LIPASE, AMYLASE in the last 168 hours. No results for input(s): AMMONIA in the last 168 hours. CBC:  Recent Labs Lab 08/02/14 0415 08/03/14 0318 08/04/14 0352 08/05/14 0505 08/06/14 0545  WBC 14.1* 11.8* 11.8* 12.0* 15.7*  HGB 9.3* 9.5* 9.9* 9.7* 9.8*  HCT 27.4* 28.3* 29.8* 28.9* 29.3*  MCV 94.8 93.4 94.0 93.2 93.9  PLT 384 412* 440* 415* 419*   Cardiac Enzymes: No results for input(s): CKTOTAL, CKMB, CKMBINDEX, TROPONINI in the last 168 hours. BNP: BNP (last 3 results)  Recent Labs  05/04/14 1700 07/20/14 0028 07/28/14 0512  BNP 1013.0* 1469.3* 2044.9*    ProBNP (last 3 results)  Recent Labs  02/24/14 1606 02/26/14 1623 03/06/14 1129  PROBNP 2973.0* 18948.0* 5943.0*    CBG: No results for input(s): GLUCAP in the last 168  hours.     Signed:  Penny Pia  [facility] 08/07/2014, 11:56 AM

## 2014-08-07 NOTE — Clinical Social Work Note (Signed)
Clinical Social Work Assessment  Patient Details  Name: Timothy Durham MRN: [id number] Date of Birth: Sep 16, 1930  Date of referral:  08/06/14               Reason for consult:  Facility Placement                Permission sought to share information with:  Family Supports Permission granted to share information::  Yes, Verbal Permission Granted  Name::     Wife -Rosetta   Housing/Transportation Living arrangements for the past 2 months:  Single Family Home Source of Information:  Patient, Spouse Patient Interpreter Needed:  None Criminal Activity/Legal Involvement Pertinent to Current Situation/Hospitalization:  No - Comment as needed Significant Relationships:  Spouse Lives with: Wife   Do you feel safe going back to the place where you live?  Yes Need for family participation in patient care:  Yes (Comment)  Care giving concerns:  Wife reports patient has had recent "TIA's" in the past day or so. Worries that he could have a large stroke.  Wife verbalizes concern about managing patient's care at home as the last time he was in the hospital- he refused rehab and when he went home he fell- hitting his head.   Social Worker assessment / plan:  81 year old male admitted from home where he lives with his wife.  Patient noted to be sitting up in a chair, very quiet and guarded during CSW's visit. Did not make eye contact and rarely spoke- even as CSW attempted to engage patient. His wife indicates that he is upset about recommendation for SNF and strongly wants to go home; does not feel he needs SNF.  Wife fully supports rehab option and encourages patient to agree to SNF.  After an extensive conversation about SNF process- patient agreed to a bed search. Wife is interested in [facility] and plans to go and tour the facility. Fl2 completed and placed on chart for MD's signature.  Employment status:  Retired Database administrator PT Recommendations:  Skilled Nursing  Facility Information / Referral to community resources:  Skilled Nursing Facility  Patient/Family's Response to care:  Patient was very guarded and very quiet during visit.  Would nod yes or no and spoke brief sentences.  Wants t go home.  Patient/Family's Understanding of and Emotional Response to Diagnosis, Current Treatment, and Prognosis:  Patient and wife were able to verbalize understanding of patient's current medication condition and treatment needs- however- patient appears to be emotionally closed and guarded during the visit.  Mrs. Campanelli was noted to be very upbeat and involved- asking appropriate questions and interacting with CSW. She also attempted multiple times to engage patient.  Both agree to a bed search- however- patient may refuse SNF tomorrow.  Emotional Assessment Appearance:  Appears stated age Attitude/Demeanor/Rapport:  Avoidant, Apprehensive, Guarded Affect (typically observed):  Guarded, Apprehensive, Quiet, Blunt Orientation:  Oriented to Self, Oriented to Place, Oriented to  Time, Oriented to Situation Alcohol / Substance use:  Alcohol Use, Tobacco Use (quit smoking over 15 years ago) Psych involvement (Current and /or in the community):  No (Comment)  Discharge Needs  Concerns to be addressed:  Care Coordination, Discharge Planning Concerns Readmission within the last 30 days:  No Current discharge risk:  Other (Pt. fell when he returned home last admission hitting his head. Denies need for rehab now) Barriers to Discharge:  No Barriers Identified   Toy Baker 08/06/2014  4:25 PM  [phone] 

## 2014-08-08 ENCOUNTER — Encounter: Payer: Self-pay | Admitting: Adult Health

## 2014-08-08 ENCOUNTER — Non-Acute Institutional Stay: Payer: Medicare HMO | Admitting: Adult Health

## 2014-08-08 DIAGNOSIS — R5381 Other malaise: Secondary | ICD-10-CM

## 2014-08-08 DIAGNOSIS — N400 Enlarged prostate without lower urinary tract symptoms: Secondary | ICD-10-CM

## 2014-08-08 DIAGNOSIS — K219 Gastro-esophageal reflux disease without esophagitis: Secondary | ICD-10-CM | POA: Diagnosis not present

## 2014-08-08 DIAGNOSIS — I10 Essential (primary) hypertension: Secondary | ICD-10-CM | POA: Diagnosis not present

## 2014-08-08 DIAGNOSIS — D72829 Elevated white blood cell count, unspecified: Secondary | ICD-10-CM

## 2014-08-08 DIAGNOSIS — I6523 Occlusion and stenosis of bilateral carotid arteries: Secondary | ICD-10-CM

## 2014-08-08 DIAGNOSIS — I5023 Acute on chronic systolic (congestive) heart failure: Secondary | ICD-10-CM

## 2014-08-08 DIAGNOSIS — I480 Paroxysmal atrial fibrillation: Secondary | ICD-10-CM

## 2014-08-08 DIAGNOSIS — N183 Chronic kidney disease, stage 3 unspecified: Secondary | ICD-10-CM

## 2014-08-08 DIAGNOSIS — J8489 Other specified interstitial pulmonary diseases: Secondary | ICD-10-CM | POA: Diagnosis not present

## 2014-08-08 DIAGNOSIS — E43 Unspecified severe protein-calorie malnutrition: Secondary | ICD-10-CM

## 2014-08-08 DIAGNOSIS — E785 Hyperlipidemia, unspecified: Secondary | ICD-10-CM

## 2014-08-08 DIAGNOSIS — Z8673 Personal history of transient ischemic attack (TIA), and cerebral infarction without residual deficits: Secondary | ICD-10-CM

## 2014-08-10 ENCOUNTER — Encounter (HOSPITAL_BASED_OUTPATIENT_CLINIC_OR_DEPARTMENT_OTHER): Payer: Commercial Managed Care - HMO

## 2014-08-11 NOTE — Telephone Encounter (Signed)
Pt scheduled for 5/23 with TP.  Nothing further needed.

## 2014-08-12 ENCOUNTER — Non-Acute Institutional Stay (SKILLED_NURSING_FACILITY): Payer: Medicare HMO | Admitting: Internal Medicine

## 2014-08-12 DIAGNOSIS — R5381 Other malaise: Secondary | ICD-10-CM

## 2014-08-12 DIAGNOSIS — N183 Chronic kidney disease, stage 3 (moderate): Secondary | ICD-10-CM

## 2014-08-12 DIAGNOSIS — D638 Anemia in other chronic diseases classified elsewhere: Secondary | ICD-10-CM | POA: Diagnosis not present

## 2014-08-12 DIAGNOSIS — N179 Acute kidney failure, unspecified: Secondary | ICD-10-CM | POA: Diagnosis not present

## 2014-08-12 DIAGNOSIS — I5023 Acute on chronic systolic (congestive) heart failure: Secondary | ICD-10-CM | POA: Diagnosis not present

## 2014-08-12 DIAGNOSIS — I639 Cerebral infarction, unspecified: Secondary | ICD-10-CM

## 2014-08-12 DIAGNOSIS — E46 Unspecified protein-calorie malnutrition: Secondary | ICD-10-CM

## 2014-08-12 DIAGNOSIS — K219 Gastro-esophageal reflux disease without esophagitis: Secondary | ICD-10-CM | POA: Diagnosis not present

## 2014-08-12 DIAGNOSIS — I480 Paroxysmal atrial fibrillation: Secondary | ICD-10-CM | POA: Diagnosis not present

## 2014-08-12 DIAGNOSIS — N400 Enlarged prostate without lower urinary tract symptoms: Secondary | ICD-10-CM

## 2014-08-12 DIAGNOSIS — J8489 Other specified interstitial pulmonary diseases: Secondary | ICD-10-CM | POA: Diagnosis not present

## 2014-08-12 NOTE — Progress Notes (Signed)
Patient ID: Timothy Durham, male   DOB: [birth year], 81 y.o.   MRN: [id number]     [facility]   PCP: CABEZA,YURI, MD   Allergies  Allergen Reactions  . Ambien [Zolpidem] Other (See Comments)    Hallucinations  . Ativan [Lorazepam] Other (See Comments)    hallucinations  . Oxycontin [Oxycodone] Other (See Comments)    hallucinations  . Penicillins     Unknown childhood reaction   . Sulfa Antibiotics     unknown    Chief Complaint  Patient presents with  . New Admit To SNF     HPI:  81 year old patient is here for short term rehabilitation post hospital admission from 07/19/14-08/07/14 with acute hypoxic respiratory failure. He was seen by pulmonary, cardiology and ID for this. It was thought to bedue to organizing pneumonia from amiodarone. He was taken off amiodarone and started on prednisone. He also had new onset CVA and was seen by neurology and is now on plavix and warfarin.  He has medical history of nonischemic cardiomyopathy with EF of 30-35%, NSTEMI in 2015 with cardiac cath showing nonobstructive CAD, chronic kidney disease stage III, history of stroke in 2000, history of bioprosthetic aVR, paroxysmal A. fib on chronic Coumadin, tobacco use. He is seen in his room today. He is on continuous oxygen, mentions that his energy is coming back. He denies any trouble breathing. No new concern from patient  Review of Systems:  Constitutional: Negative for fever, chills, diaphoresis.  HENT: Negative for headache, congestion, nasal discharge Eyes: Negative for eye pain, blurred vision, double vision and discharge.  Respiratory: positive for occasional cough and shortness of breath with exertion. Negative for wheezing.   Cardiovascular: Negative for chest pain, palpitations. Has chronic ankle swelling right > left Gastrointestinal: Negative for heartburn, nausea, vomiting, abdominal pain. appettie is picking up. Had a bowel movement this  am Genitourinary: Negative for dysuria Musculoskeletal: Negative for back pain, falls Skin: Negative for itching, rash.  Neurological: positive for weakness.  Psychiatric/Behavioral: Negative for depression   Past Medical History  Diagnosis Date  . Arthritis   . Non-obstructive CAD     a. 12/2013 NSTEMI/Cath: LM nl, LAD 20, LCX 40-50, RCA 20.  Marland Kitchen Hypertension   . Stroke     a. July 2000;  b. January 2007;  c. TIA in 2014.  . CKD (chronic kidney disease), stage III   . GERD (gastroesophageal reflux disease)   . Foot pain, bilateral   . H/O hematuria   . Hyperlipidemia   . Vitamin D deficiency   . Chronic combined systolic and diastolic CHF (congestive heart failure)     a. 12/2013 Echo: EF 30-35%, mod conc LVH, Gr 3 DD, mild AI, mod-sev MR, mod dil LA, mild TR.  Marland Kitchen PAF (paroxysmal atrial fibrillation)     a. CHA2DS2VASc = 7--> Chronic Coumadin.  Marland Kitchen NICM (nonischemic cardiomyopathy)     a. 12/2013 Echo: EF 30-35%.  . Aortic valve prosthesis present     a. 2006: S/P bioprosthetic AVR.  Marland Kitchen PAD (peripheral artery disease)     a. s/p LLE stenting.  . Carotid arterial disease     a. s/p R CEA  . History of tobacco abuse   . Moderate to Severe Mitral Regurgitation     a. 12/2013 Echo: Mod-Sev MR.  Marland Kitchen Anxiety   . COPD (chronic obstructive pulmonary disease)   . Coronary arteriosclerosis Oct 2015    med Rx  . Shortness  of breath dyspnea   . Depression   . Presence of permanent cardiac pacemaker   . Heart murmur   . Dysrhythmia    Past Surgical History  Procedure Laterality Date  . Aortic valve replacement (avr)/coronary artery bypass grafting (cabg)  12/2008  . Eye surgery    . Carotid angiogram  1996  . Left lower extremity stent    . Carotid endarterectomy    . Insert / replace / remove pacemaker    . Coronary angiogram  Oct 2015  . Pacemaker insertion  Nov 2015    MDT BiV  . Left and right heart catheterization with coronary angiogram N/A 01/14/2014    Procedure: LEFT  AND RIGHT HEART CATHETERIZATION WITH CORONARY ANGIOGRAM;  Surgeon: Peter M Swaziland, MD;  Location: [facility] CATH LAB;  Service: Cardiovascular;  Laterality: N/A;  . Bi-ventricular pacemaker insertion N/A 02/17/2014    Procedure: BI-VENTRICULAR PACEMAKER INSERTION (CRT-P);  Surgeon: Marinus Maw, MD;  Location: [facility] CATH LAB;  Service: Cardiovascular;  Laterality: N/A;  . Coronary artery bypass graft    . Coronary angioplasty    . Tonsillectomy     Social History:   reports that he quit smoking about 15 years ago. His smoking use included Cigarettes. He has a 70 pack-year smoking history. He has never used smokeless tobacco. He reports that he drinks about 0.6 oz of alcohol per week. He reports that he does not use illicit drugs.  Family History  Problem Relation Age of Onset  . Stroke Mother   . Cancer Father     Medications: Patient's Medications  New Prescriptions   No medications on file  Previous Medications   ACETAMINOPHEN (TYLENOL) 500 MG TABLET    Take 1,000 mg by mouth at bedtime.    ALLOPURINOL (ZYLOPRIM) 100 MG TABLET    Take 200 mg by mouth daily.   ATORVASTATIN (LIPITOR) 40 MG TABLET    Take 40 mg by mouth every evening.   CALCIUM CARB-CHOLECALCIFEROL (CALCIUM 600 + D PO)    Take 2 tablets by mouth every morning.   CARVEDILOL (COREG) 3.125 MG TABLET    Take 1 tablet (3.125 mg total) by mouth 2 (two) times daily with a meal.   CLOPIDOGREL (PLAVIX) 75 MG TABLET    Take 75 mg by mouth daily.   DULOXETINE (CYMBALTA) 60 MG CAPSULE    Take 60 mg by mouth daily.    OMEGA-3 FATTY ACIDS (FISH OIL) 1200 MG CAPS    Take 1 capsule by mouth every morning.   OMEPRAZOLE (PRILOSEC) 40 MG CAPSULE    Take 40 mg by mouth every morning.   POTASSIUM CHLORIDE SA (K-DUR,KLOR-CON) 20 MEQ TABLET    Take 1 tablet (20 mEq total) by mouth as needed. TAKE ONLY WHEN YOU TAKE METOLAZONE   PREDNISONE (DELTASONE) 10 MG TABLET    Take 1 tablet (10 mg total) by mouth as directed. Take 4 tabs orally for the next 4  days, then take 3 tabs orally for the next 4 days, then take 2 tabs orally for the next 4 days, then take 1 tab orally for the next 4 days.   TAMSULOSIN (FLOMAX) 0.4 MG CAPS CAPSULE    Take 2 capsules (0.8 mg total) by mouth at bedtime.   TORSEMIDE (DEMADEX) 20 MG TABLET    Take 2 tablets (40 mg total) by mouth daily. Take 40 mg in am and 20 mg in pm   WARFARIN (COUMADIN) 4 MG TABLET    Take 2  mg by mouth daily at 6 PM.   Modified Medications   No medications on file  Discontinued Medications   No medications on file     Physical Exam: Filed Vitals:   08/12/14 2004  BP: 127/60  Pulse: 84  Temp: 97.3 F (36.3 C)  Resp: 18  Weight: 145 lb 8 oz (65.998 kg)  SpO2: 95%    General- elderly male, in no acute distress Head- normocephalic, atraumatic Throat- moist mucus membrane Eyes-  no pallor, no icterus, no discharge, normal conjunctiva, normal sclera Neck- no cervical lymphadenopathy, no JVD Cardiovascular- normal s1,s2, no murmurs, palpable dorsalis pedis Respiratory- bilateral poor air entry, occasional rhonchi, no wheeze,  no crackles, no use of accessory muscles, on o2 by nasal canula Abdomen- bowel sounds present, soft, non tender Musculoskeletal- able to move all 4 extremities, generalized weakness, ankle edema right> left Neurological- no focal deficit Skin- warm and dry Psychiatry- alert and oriented to person, place and time, normal mood and affect    Labs reviewed: Basic Metabolic Panel:  Recent Labs  45/40/98 0340  07/27/14 0539  08/05/14 0505 08/06/14 0545 08/07/14 0522  NA 133*  < > 132*  < > 137 139 137  K 4.8  < > 4.1  < > 3.7 3.8 3.9  CL 95*  < > 92*  < > 93* 94* 93*  CO2 23  < > 28  < > 33* 35* 33*  GLUCOSE 108*  < > 89  < > 167* 88 107*  BUN 69*  < > 82*  < > 68* 70* 58*  CREATININE 2.15*  < > 2.26*  < > 2.00* 1.82* 1.59*  CALCIUM 8.6  < > 9.4  < > 8.7* 9.0 9.0  MG 2.2  --  2.2  --   --   --   --   < > = values in this interval not  displayed. Liver Function Tests:  Recent Labs  07/26/14 0407 07/27/14 0539 07/28/14 0522  AST 42* 30 26  ALT 51 44 38  ALKPHOS 130* 126 123  BILITOT 1.8* 1.2 1.1  PROT 6.2 6.0* 6.3*  ALBUMIN 2.2* 2.1* 2.1*   No results for input(s): LIPASE, AMYLASE in the last 8760 hours. No results for input(s): AMMONIA in the last 8760 hours. CBC:  Recent Labs  02/22/14 0100 02/24/14 1606  07/20/14 0028  08/04/14 0352 08/05/14 0505 08/06/14 0545  WBC 6.7 6.8  < > 12.2*  < > 11.8* 12.0* 15.7*  NEUTROABS 4.9 5.3  --  11.3*  --   --   --   --   HGB 12.6* 13.1  < > 10.2*  < > 9.9* 9.7* 9.8*  HCT 37.4* 40.1  < > 29.9*  < > 29.8* 28.9* 29.3*  MCV 99.7 102.2*  < > 94.9  < > 94.0 93.2 93.9  PLT 218 237.0  < > 242  < > 440* 415* 419*  < > = values in this interval not displayed. Cardiac Enzymes:  Recent Labs  02/09/14 1730 02/22/14 0100 05/04/14 1700  TROPONINI <0.30 <0.30 <0.03   BNP: Invalid input(s): POCBNP CBG:  Recent Labs  01/15/14 0632  GLUCAP 76     Assessment/Plan  Physical deconditioning Will have him work with physical therapy and occupational therapy team to help with gait training and muscle strengthening exercises.fall precautions. Skin care. Encourage to be out of bed.   Organizing pneumonia In setting of use of amiodarone, off amiodarone, continue and complete tapering of  prednisone. Wean off oxygen as tolerated to keep o2 sat > 90%. Has pulmonary follow up. Encouraged to use incentive spirometer  CHF euvolemic on exam. Has ischemic cardiomyopathy with EF 25-30%. Continue coreg 3.125 mg bid and torsemide 40 mg in am and 20 mg pm, monitor weight. Not on ACEI/ARB with his CKD. Check bmp. Continue kcl supplement  New CVA Continue plavix and coumadin. To work with therapy team for strengthening exercises. Continue lipitor 40 mg daily. Continue bp medications  afib Rate controlled. Has biventricular paced rhythm. Continue coreg 3.125 mg bid. On coumadin for  anticoagulation. inr 2.3 today. Continue curren tregimen of coumadin 2 mg daily and check inr in 1 week  Acute on chronic kidney disease stage III Baseline creatinine around 1.8-2. Monitor renal function  Anemia of chronic disease Monitor h&h  GERD Continue prilosec 40 mg daily  Malnutrition,  Monitor weight, nutrition team to follow. Continue supplements  BPH Continue flomax   Goals of care: short term rehabilitation   Labs/tests ordered: cbc , cmp  Family/ staff Communication: reviewed care plan with patient and nursing supervisor    Oneal Grout, MD  [facility] [phone] (Monday-Friday 8 am - 5 pm) [phone] (afterhours)

## 2014-08-14 ENCOUNTER — Encounter: Payer: Self-pay | Admitting: Adult Health

## 2014-08-14 ENCOUNTER — Non-Acute Institutional Stay: Payer: Medicare HMO | Admitting: Adult Health

## 2014-08-14 ENCOUNTER — Encounter (HOSPITAL_COMMUNITY): Payer: Medicare HMO

## 2014-08-14 DIAGNOSIS — N183 Chronic kidney disease, stage 3 unspecified: Secondary | ICD-10-CM

## 2014-08-14 DIAGNOSIS — E46 Unspecified protein-calorie malnutrition: Secondary | ICD-10-CM | POA: Diagnosis not present

## 2014-08-14 DIAGNOSIS — I480 Paroxysmal atrial fibrillation: Secondary | ICD-10-CM | POA: Diagnosis not present

## 2014-08-14 DIAGNOSIS — D638 Anemia in other chronic diseases classified elsewhere: Secondary | ICD-10-CM

## 2014-08-14 DIAGNOSIS — I6523 Occlusion and stenosis of bilateral carotid arteries: Secondary | ICD-10-CM | POA: Diagnosis not present

## 2014-08-14 DIAGNOSIS — I10 Essential (primary) hypertension: Secondary | ICD-10-CM

## 2014-08-14 DIAGNOSIS — E785 Hyperlipidemia, unspecified: Secondary | ICD-10-CM

## 2014-08-14 DIAGNOSIS — J8489 Other specified interstitial pulmonary diseases: Secondary | ICD-10-CM | POA: Diagnosis not present

## 2014-08-14 DIAGNOSIS — Z8673 Personal history of transient ischemic attack (TIA), and cerebral infarction without residual deficits: Secondary | ICD-10-CM | POA: Diagnosis not present

## 2014-08-14 DIAGNOSIS — D72829 Elevated white blood cell count, unspecified: Secondary | ICD-10-CM

## 2014-08-14 DIAGNOSIS — K219 Gastro-esophageal reflux disease without esophagitis: Secondary | ICD-10-CM

## 2014-08-14 DIAGNOSIS — I5023 Acute on chronic systolic (congestive) heart failure: Secondary | ICD-10-CM | POA: Diagnosis not present

## 2014-08-14 DIAGNOSIS — N400 Enlarged prostate without lower urinary tract symptoms: Secondary | ICD-10-CM | POA: Diagnosis not present

## 2014-08-14 DIAGNOSIS — R5381 Other malaise: Secondary | ICD-10-CM | POA: Diagnosis not present

## 2014-08-14 NOTE — Progress Notes (Signed)
Patient ID: Timothy Durham, male   DOB: 03/13/1931, 81 y.o.   MRN: [id number]   08/14/14  Facility:  Nursing Home Location:  [facility] Room Number: [id number] LEVEL OF CARE:  SNF (31)  Chief Complaint  Patient presents with  . Discharge Notes    Physical deconditioning, organizing pneumonia, CHF, chronic kidney disease, history of CVA, atrial fibrillation, GERD, protein calorie malnutrition, BPH, leukocytosis, carotid stenosis, hypertension, anemia and hyperlipidemia     HISTORY OF PRESENT ILLNESS:  This is an 81 year old male who is for discharge home with home health nursing for disease management and PT for gait training/strengthening. He has been admitted to [facility] on 08/07/14 from [facility]. He has been having shortness of breath 3 days associated with nonproductive cough and wheezing. Chest x-ray showed moderate vascular congestion. Labs showed elevated BNP of 1400. 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 Amiodarone was put on hold indefinitely and slow prednisone taper.  Latest BNP 774.6. No SOB nor wheezing.  Patient was admitted to this facility for short-term rehabilitation after the patient's recent hospitalization.  Patient has completed SNF rehabilitation and therapy has cleared the patient for discharge.   PAST MEDICAL HISTORY:  Past Medical History  Diagnosis Date  . Arthritis   . Non-obstructive CAD     a. 12/2013 NSTEMI/Cath: LM nl, LAD 20, LCX 40-50, RCA 20.  Marland Kitchen Hypertension   . Stroke     a. July 2000;  b. January 2007;  c. TIA in 2014.  . CKD (chronic kidney disease), stage III   . GERD (gastroesophageal reflux disease)   . Foot pain, bilateral   . H/O hematuria   . Hyperlipidemia   . Vitamin D deficiency   . Chronic combined systolic and diastolic CHF (congestive heart failure)     a. 12/2013 Echo: EF 30-35%, mod conc LVH, Gr 3 DD, mild  AI, mod-sev MR, mod dil LA, mild TR.  Marland Kitchen PAF (paroxysmal atrial fibrillation)     a. CHA2DS2VASc = 7--> Chronic Coumadin.  Marland Kitchen NICM (nonischemic cardiomyopathy)     a. 12/2013 Echo: EF 30-35%.  . Aortic valve prosthesis present     a. 2006: S/P bioprosthetic AVR.  Marland Kitchen PAD (peripheral artery disease)     a. s/p LLE stenting.  . Carotid arterial disease     a. s/p R CEA  . History of tobacco abuse   . Moderate to Severe Mitral Regurgitation     a. 12/2013 Echo: Mod-Sev MR.  Marland Kitchen Anxiety   . COPD (chronic obstructive pulmonary disease)   . Coronary arteriosclerosis Oct 2015    med Rx  . Shortness of breath dyspnea   . Depression   . Presence of permanent cardiac pacemaker   . Heart murmur   . Dysrhythmia     CURRENT MEDICATIONS: Reviewed per MAR/see medication list  Allergies  Allergen Reactions  . Ambien [Zolpidem] Other (See Comments)    Hallucinations  . Ativan [Lorazepam] Other (See Comments)    hallucinations  . Oxycontin [Oxycodone] Other (See Comments)    hallucinations  . Penicillins     Unknown childhood reaction   . Sulfa Antibiotics     unknown     REVIEW OF SYSTEMS:  GENERAL: no change in appetite, no fatigue, no weight changes, no fever, chills or weakness RESPIRATORY: no cough, SOB, DOE, wheezing, hemoptysis CARDIAC: no chest pain, or palpitations GI: no  abdominal pain, diarrhea, constipation, heart burn, nausea or vomiting  PHYSICAL EXAMINATION  GENERAL: no acute distress, normal body habitus NECK: supple, trachea midline, no neck masses, no thyroid tenderness, no thyromegaly LYMPHATICS: no LAN in the neck, no supraclavicular LAN RESPIRATORY: breathing is even & unlabored, BS CTAB CARDIAC: RRR, no murmur,no extra heart sounds, RLE edema 1+; left chest pacemaker GI: abdomen soft, normal BS, no masses, no tenderness, no hepatomegaly, no splenomegaly EXTREMITIES: Able to move 4 extremities PSYCHIATRIC: the patient is alert & oriented to person, affect &  behavior appropriate  LABS/RADIOLOGY: Labs reviewed: 08/13/14  WBC 7.9 hemoglobin 9.9 hematocrit 29.3 MCV 93.3 sodium 133 potassium 3.5 glucose 71 BUN 49 creatinine 1.48 SGOT 57 SGPT 84 total protein 5.5 albumin Green 3.1 calcium 8.3 BNP 774.6 Basic Metabolic Panel:  Recent Labs  16/10/96 0340  07/27/14 0539  08/05/14 0505 08/06/14 0545 08/07/14 0522  NA 133*  < > 132*  < > 137 139 137  K 4.8  < > 4.1  < > 3.7 3.8 3.9  CL 95*  < > 92*  < > 93* 94* 93*  CO2 23  < > 28  < > 33* 35* 33*  GLUCOSE 108*  < > 89  < > 167* 88 107*  BUN 69*  < > 82*  < > 68* 70* 58*  CREATININE 2.15*  < > 2.26*  < > 2.00* 1.82* 1.59*  CALCIUM 8.6  < > 9.4  < > 8.7* 9.0 9.0  MG 2.2  --  2.2  --   --   --   --   < > = values in this interval not displayed. Liver Function Tests:  Recent Labs  07/26/14 0407 07/27/14 0539 07/28/14 0522  AST 42* 30 26  ALT 51 44 38  ALKPHOS 130* 126 123  BILITOT 1.8* 1.2 1.1  PROT 6.2 6.0* 6.3*  ALBUMIN 2.2* 2.1* 2.1*   CBC:  Recent Labs  02/22/14 0100 02/24/14 1606  07/20/14 0028  08/04/14 0352 08/05/14 0505 08/06/14 0545  WBC 6.7 6.8  < > 12.2*  < > 11.8* 12.0* 15.7*  NEUTROABS 4.9 5.3  --  11.3*  --   --   --   --   HGB 12.6* 13.1  < > 10.2*  < > 9.9* 9.7* 9.8*  HCT 37.4* 40.1  < > 29.9*  < > 29.8* 28.9* 29.3*  MCV 99.7 102.2*  < > 94.9  < > 94.0 93.2 93.9  PLT 218 237.0  < > 242  < > 440* 415* 419*  < > = values in this interval not displayed.  Lipid Panel:  Recent Labs  08/03/14 1630  HDL 54   Cardiac Enzymes:  Recent Labs  02/09/14 1730 02/22/14 0100 05/04/14 1700  TROPONINI <0.30 <0.30 <0.03   CBG:  Recent Labs  01/15/14 0632  GLUCAP 76    Dg Chest 2 View  08/04/2014   CLINICAL DATA:  Short of breath. Weakness. History of pulmonary fibrosis.  EXAM: CHEST  2 VIEW  COMPARISON:  08/01/2014.  FINDINGS: Changes from cardiac surgery are stable. Cardiac silhouette is mildly enlarged. No mediastinal or hilar masses.  Left upper lobe  airspace opacity noted previously has improved.  Areas of coarse reticular type opacity in both lungs and at both lung bases is stable consistent with scarring.  No pulmonary edema. Minimal left pleural effusions suggested. No pneumothorax.  IMPRESSION: 1. Improved left upper lobe pneumonia. 2. No new abnormalities.  Persistent areas of  lung scarring.   Electronically Signed   By: Amie Portland M.D.   On: 08/04/2014 10:10   Dg Chest 2 View  07/31/2014   CLINICAL DATA:  Acute respiratory failure with hypoxia. Pt hx COPD, CAD  EXAM: CHEST  2 VIEW  COMPARISON:  07/30/2014  FINDINGS: Patchy areas of airspace and interstitial opacities in the lungs are stable from most recent prior exam. No new lung abnormalities. No significant pleural effusion and no pneumothorax.  Changes from cardiac surgery and aortic valve replacement are stable. Cardiac silhouette is mildly enlarged. No mediastinal or hilar masses. Left anterior chest wall sequential pacemaker is stable and well positioned.  IMPRESSION: No change from the previous day's study. Bilateral patchy airspace and interstitial lung opacities are stable. No new abnormalities.   Electronically Signed   By: Amie Portland M.D.   On: 07/31/2014 13:02   Dg Chest 2 View  07/30/2014   CLINICAL DATA:  Hypoxia.  Weakness.  EXAM: CHEST  2 VIEW  COMPARISON:  07/28/2014.  FINDINGS: There is improvement from 07/28/2014, with partial clearance of the multifocal bilateral airspace opacities although significant opacities persist, left greater than right. Mild right lateral base pleural thickening persists. No significant effusion is evident. There is moderate unchanged cardiomegaly. There is prior sternotomy and aortic valvuloplasty. There are intact appearances of the transvenous leads.  IMPRESSION: Slight improvement. Multifocal bilateral airspace opacities persist, partially cleared.   Electronically Signed   By: Ellery Plunk M.D.   On: 07/30/2014 06:50   Dg Chest 2  View  07/28/2014   CLINICAL DATA:  Respiratory failure .  EXAM: CHEST  2 VIEW  COMPARISON:  07/22/2014 .  CT 07/25/2014.  FINDINGS: Mediastinum and hilar structures are normal. Prior cardiac valve replacement. Cardiac pacer noted with lead tips in stable position. Persistent multifocal bilateral pulmonary infiltrates, no interim change. Small right pleural effusion. No pneumothorax.  IMPRESSION: 1. Persistent multifocal bilateral pulmonary infiltrates most consistent with pneumonia. No significant interim change.  2. Prior cardiac valve replacement. Cardiac pacer stable position. Stable cardiomegaly .   Electronically Signed   By: Maisie Fus  Register   On: 07/28/2014 08:33   Dg Chest 2 View  07/20/2014   CLINICAL DATA:  Shortness of breath.  Pacemaker.  EXAM: CHEST  2 VIEW  COMPARISON:  05/04/2014.  FINDINGS: Mildly increased cardiac silhouette. Triple lead pacer unchanged from priors. Moderate vascular congestion without overt failure or significant consolidation. Mild to moderate pleural effusions, greater on the RIGHT. Aortic valvular placement with prior median sternotomy. No acute osseous findings.  IMPRESSION: Cardiomegaly with stable appearance of the transvenous pacer.  Moderate vascular congestion without overt failure. Overall improved from prior radiograph in February.   Electronically Signed   By: Davonna Belling M.D.   On: 07/20/2014 02:23   Ct Head Wo Contrast  08/05/2014   CLINICAL DATA:  Intermittent left-sided weakness and difficulty speaking for 1 day  EXAM: CT HEAD WITHOUT CONTRAST  TECHNIQUE: Contiguous axial images were obtained from the base of the skull through the vertex without intravenous contrast.  COMPARISON:  Aug 03, 2014  FINDINGS: Moderate generalized atrophy is stable. There is no intracranial mass, hemorrhage, extra-axial fluid collection, or midline shift. There is evidence of a prior infarct in the right frontal -parietal junction region with some involvement of the superior right  temporal lobe as well, stable. There is evidence of a prior inferior, medial right occipital lobe infarct, stable. There is evidence of a prior infarct just superior to the  superior most aspect the left lateral ventricle, stable. There is no new gray-white compartment lesion. No acute infarct apparent. Bony calvarium appears intact. The mastoid air cells clear.  IMPRESSION: Atrophy with prior infarcts as noted above. No acute infarct apparent. No hemorrhage or mass effect. No change from recent prior study.   Electronically Signed   By: Bretta Bang III M.D.   On: 08/05/2014 13:52   Ct Head Wo Contrast  08/04/2014   CLINICAL DATA:  Episode yesterday of difficulty word finding, left facial on left arm weakness. This was followed by right upper extremity weakness and paresthesias.  EXAM: CT HEAD WITHOUT CONTRAST  TECHNIQUE: Contiguous axial images were obtained from the base of the skull through the vertex without intravenous contrast.  COMPARISON:  CT 08/02/2014, MR 03/26/2013.  FINDINGS: There is no intracranial hemorrhage or evidence of acute infarction. There is no extra-axial fluid collection. There is encephalomalacia due to remote infarction in the right occipital, right frontoparietal and left posterior frontal regions, these remote infarctions are unchanged from the prior MRI. No new infarction is evident. There is no mass, mass effect or midline shift. There is moderate generalized atrophy. No significant bony abnormality is evident. No interval change is evident from yesterday's CT.  IMPRESSION: Remote infarctions as detailed above. No evidence of new infarct or of infarct extension. Moderate chronic atrophy.   Electronically Signed   By: Ellery Plunk M.D.   On: 08/04/2014 02:24   Ct Head Wo Contrast  08/02/2014   CLINICAL DATA:  Right facial droop.  History of stroke.  EXAM: CT HEAD WITHOUT CONTRAST  TECHNIQUE: Contiguous axial images were obtained from the base of the skull through the  vertex without intravenous contrast.  COMPARISON:  Head CT dated 03/25/2013 and brain MR dated 03/26/2013.  FINDINGS: Previously demonstrated in bold right occipital, right frontoparietal and small left posterior frontal infarcts are unchanged. Diffusely enlarged ventricles and subarachnoid spaces. No intracranial hemorrhage, mass lesion or CT evidence of acute infarction. Unremarkable bones and included paranasal sinuses.  IMPRESSION: 1. No acute abnormality. 2. Stable old infarcts, as described above. 3. Stable mild to moderate diffuse cerebral atrophy. These results were called by telephone at the time of interpretation on 08/02/2014 at 9:32 am to Dr. Thad Ranger, who verbally acknowledged these results.   Electronically Signed   By: Beckie Salts M.D.   On: 08/02/2014 09:32   Ct Chest Wo Contrast  07/25/2014   CLINICAL DATA:  Hypoxia and progressive shortness of breath for the past 3 days. Nonproductive cough and wheeze  EXAM: CT CHEST WITHOUT CONTRAST  TECHNIQUE: Multidetector CT imaging of the chest was performed following the standard protocol without IV contrast.  COMPARISON:  None.  FINDINGS: THORACIC INLET/BODY WALL:  There is a biventricular pacer from the left with leads in unremarkable position. No acute findings at the thoracic inlet.  There is an incidental 1 cm intramuscular lipoma in the right chest wall.  MEDIASTINUM:  Cardiomegaly which is chronic based on chest x-rays. No pericardial effusion. The patient is status post median sternotomy with aortic valve replacement. The ascending aorta measures 41 mm in diameter. There is mild enlargement of mediastinal lymph nodes which could be reactive to the presumed pneumonia or sequela of remote granulomatous disease (there is also scattered nodal coarse calcification). No acute vascular findings. Negative esophagus.  LUNG WINDOWS:  There is patchy consolidative and ground-glass opacities in all lobes. Airspace disease is most extensive in the left upper  lobe and most dense  in the posterior segment right upper lobe. Small bilateral pleural effusions with interlobular septal thickening. No cavitation. No evidence of loculated effusion.  UPPER ABDOMEN:  Granulomatous changes in the liver and spleen.  OSSEOUS:  No acute findings.  IMPRESSION: 1. Multilobar pneumonia. 2. Superimposed interstitial edema and small pleural effusions. 3. Chronic and postoperative findings noted above.   Electronically Signed   By: Marnee Spring M.D.   On: 07/25/2014 08:00   Dg Esophagus  07/29/2014   CLINICAL DATA:  Dysphagia.  Choking.  Bilateral pneumonia.  EXAM: ESOPHOGRAM/BARIUM SWALLOW  TECHNIQUE: Combined double contrast and single contrast examination performed using effervescent crystals, thick barium liquid, and thin barium liquid.  FLUOROSCOPY TIME:  Radiation Exposure Index (as provided by the fluoroscopic device): Dose area product: 829.79uGy*m^2  COMPARISON:  Radiographs 07/28/2014.  Chest CT 07/25/2014.  FINDINGS: Pharyngeal phase of swallowing was normal. The study was carried out in the AP projection due to patient condition. Nonspecific esophageal dysmotility was present without stricture. 0/3 primary waves reach the gastroesophageal junction. Tertiary contractions were observed. Fetal on esophagus incidentally noted.  There is no bear ear passage of the 13 mm barium tablet challenge however the tablet remain static in the lower esophagus just proximal to the gastroesophageal junction for several min. No gastroesophageal reflux could be elicited with water siphon test.  Esophageal stasis was present in the supine position. Esophageal folds appeared within normal limits.  IMPRESSION: 1. No esophageal stricture or mass. 2. Nonspecific esophageal dysmotility disorder with with tertiary contractions. Stasis in the supine position.   Electronically Signed   By: Andreas Newport M.D.   On: 07/29/2014 09:39   US Renal  07/20/2014   CLINICAL DATA:  Acute kidney injury.   Hypertension.  EXAM: RENAL/URINARY TRACT ULTRASOUND COMPLETE  COMPARISON:  None.  FINDINGS: Right Kidney:  Length: 10.3 cm. Echogenicity within normal limits. No mass or hydronephrosis visualized. Diffuse cortical thinning with prominent renal sinus fat.  Left Kidney:  Length: 11.1 cm. Echogenicity within normal limits. No mass or hydronephrosis visualized. Diffuse cortical thinning with prominent renal sinus fat.  Bladder:  Multiple diverticula. The largest measures 4.9 cm in maximum diameter. Small amount of internal debris with no definite calculi  IMPRESSION: 1. Diffuse bilateral renal cortical atrophy. 2. Multiple bladder diverticula. 3. Small amount of debris in the bladder with no definite calculi.   Electronically Signed   By: Beckie Salts M.D.   On: 07/20/2014 12:27   Dg Chest Port 1 View  08/01/2014   CLINICAL DATA:  81 year old male with a history of pulmonary infiltrates and shortness of breath.  EXAM: PORTABLE CHEST - 1 VIEW  COMPARISON:  Multiple prior, most recent 07/31/2014, 07/30/2014  FINDINGS: Cardiomediastinal silhouette unchanged.  Atherosclerosis.  Unchanged cardiac pacing device with 3 leads in place.  Bilateral airspace disease, involving the right greater than left bases and the left greater than right apices. Distribution is similar to comparison plain film. Over the course of several chest x-ray this has improved, dating to 07/22/2014 and 07/20/2014.  Trace pleural effusions.  Calcified right hilar lymph nodes.  IMPRESSION: Persisting bilateral airspace disease, which is unchanged since the most recent plain films though has improved since the comparisons of 07/20/2014.  Persisting small pleural effusions.  Signed,  Yvone Neu. Loreta Ave, DO  [facility]  [facility]   Electronically Signed   By: Gilmer Mor D.O.   On: 08/01/2014 07:19   Dg Chest Port 1 View  07/22/2014   CLINICAL  DATA:  Acute onset of hypoxemia.  Initial encounter.  EXAM:  PORTABLE CHEST - 1 VIEW  COMPARISON:  Chest radiograph performed 07/20/2014  FINDINGS: The lungs are well-aerated. Worsening bilateral midlung airspace opacification raises concern for multifocal pneumonia. Small bilateral pleural effusions are seen, and underlying interstitial edema cannot be excluded. Vascular congestion is noted. No pneumothorax is seen.  The cardiomediastinal silhouette is mildly enlarged. The patient is status post median sternotomy. A pacemaker is noted overlying the left chest wall, with leads ending overlying the right atrium and right ventricle. No acute osseous abnormalities are seen.  IMPRESSION: 1. Worsening bilateral mid lung airspace opacification raises concern for multifocal pneumonia. 2. Small bilateral pleural effusions, with vascular congestion and mild cardiomegaly. Underlying interstitial edema cannot be excluded.   Electronically Signed   By: Roanna Raider M.D.   On: 07/22/2014 04:30    ASSESSMENT/PLAN:  Physical deconditioning - for home health PT and Nursing Organizing Pneumonia -  wean off O2 as tolerated; continue Prednisone taper; hold Amiodarone indefinitely Acute on chronic systolic CHF - continue Demadex 40 mg by mouth every morning and 20 mg by mouth every afternoon, Coreg 2.125 mg by mouth twice a day and Lipitor 40 mg by mouth daily; not on ACEi/ARB due to CKD; follow-up with cardiology, Dr. Shirlee Latch, on 08/15/14 Acute on chronic kidney disease, stage III - creatinine 1.59; will monitor History of CVA - CT showed bilateral infarcts but no acute abnormality, repeat CT showed no new infarcts; 2-D echo EF 25-30% ; continue Plavix 75 mg by mouth daily and Coumadin Atrial fibrillation - has biventricular pacemaker; amiodarone on hold indefinitely; CHDS 5; continue Coreg and Coumadin GERD - continue Prilosec 40 mg by mouth every morning Protein calorie malnutrition, severe - albumin 2.9; continue supplementation BPH - continue Flomax 0.4 mg 2 capsules by mouth Q  HS Leukocytosis - wbc 15.7; probably due to steroids; will monitor Carotid stenosis - S/P right CEA; left high-grade ICA stenosis but failed stenting; vascular surgery consultation done and does not recommend any further intervention at this time; recommends repeating carotid duplex in 1 year for follow-up Hypertension - well controlled; continue Coreg 3.125 mg by mouth twice a day Hyperlipidemia - continue Lipitor 40 mg by mouth daily Anemia of chronic disease - hgb 9.9; stable   I have filled out patient's discharge paperwork and written prescriptions.  Patient will receive home health PT and Nursing.  Total discharge time: Greater than 30 minutes  Discharge time involved coordination of the discharge process with social worker, nursing staff and therapy department. Medical justification for home health services verified.     [facility], NP [employer] [phone]

## 2014-08-14 NOTE — Progress Notes (Signed)
Patient ID: Timothy Durham, male   DOB: Nov 17, 1930, 81 y.o.   MRN: [id number]   08/08/14  Facility:  Nursing Home Location:  [facility] Room Number: [id number] LEVEL OF CARE:  SNF (31)  Chief Complaint  Patient presents with  . Hospitalization Follow-up    Physical deconditioning, organizing pneumonia, CHF, chronic kidney disease, history of CVA, atrial fibrillation, GERD, protein calorie malnutrition, BPH, leukocytosis, corrected stenosis, hypertension and hyperlipidemia     HISTORY OF PRESENT ILLNESS:  This is an 81 year old male who has been admitted to [facility] on 08/07/14 from [facility]. He has been having shortness of breath 3 days associated with nonproductive cough and wheezing. Chest x-ray showed moderate vascular congestion. Labs showed elevated BNP of 1400. 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 Amiodarone was put on hold indefinitely and slow prednisone taper.  He has been admitted for a short-term rehabilitation.  PAST MEDICAL HISTORY:  Past Medical History  Diagnosis Date  . Arthritis   . Non-obstructive CAD     a. 12/2013 NSTEMI/Cath: LM nl, LAD 20, LCX 40-50, RCA 20.  Marland Kitchen Hypertension   . Stroke     a. July 2000;  b. January 2007;  c. TIA in 2014.  . CKD (chronic kidney disease), stage III   . GERD (gastroesophageal reflux disease)   . Foot pain, bilateral   . H/O hematuria   . Hyperlipidemia   . Vitamin D deficiency   . Chronic combined systolic and diastolic CHF (congestive heart failure)     a. 12/2013 Echo: EF 30-35%, mod conc LVH, Gr 3 DD, mild AI, mod-sev MR, mod dil LA, mild TR.  Marland Kitchen PAF (paroxysmal atrial fibrillation)     a. CHA2DS2VASc = 7--> Chronic Coumadin.  Marland Kitchen NICM (nonischemic cardiomyopathy)     a. 12/2013 Echo: EF 30-35%.  . Aortic valve prosthesis present     a. 2006: S/P bioprosthetic AVR.  Marland Kitchen PAD (peripheral artery disease)      a. s/p LLE stenting.  . Carotid arterial disease     a. s/p R CEA  . History of tobacco abuse   . Moderate to Severe Mitral Regurgitation     a. 12/2013 Echo: Mod-Sev MR.  Marland Kitchen Anxiety   . COPD (chronic obstructive pulmonary disease)   . Coronary arteriosclerosis Oct 2015    med Rx  . Shortness of breath dyspnea   . Depression   . Presence of permanent cardiac pacemaker   . Heart murmur   . Dysrhythmia     CURRENT MEDICATIONS: Reviewed per MAR/see medication list  Allergies  Allergen Reactions  . Ambien [Zolpidem] Other (See Comments)    Hallucinations  . Ativan [Lorazepam] Other (See Comments)    hallucinations  . Oxycontin [Oxycodone] Other (See Comments)    hallucinations  . Penicillins     Unknown childhood reaction   . Sulfa Antibiotics     unknown     REVIEW OF SYSTEMS:  GENERAL: no change in appetite, no fatigue, no weight changes, no fever, chills or weakness RESPIRATORY: no cough, SOB, DOE, wheezing, hemoptysis CARDIAC: no chest pain, or palpitations GI: no abdominal pain, diarrhea, constipation, heart burn, nausea or vomiting  PHYSICAL EXAMINATION  GENERAL: no acute distress, normal body habitus EYES: conjunctivae normal, sclerae normal, normal eye lids NECK: supple, trachea midline, no neck masses, no thyroid tenderness, no thyromegaly LYMPHATICS: no LAN in the neck, no  supraclavicular LAN RESPIRATORY: breathing is even & unlabored, BS CTAB CARDIAC: RRR, no murmur,no extra heart sounds, RLE edema 1+; left chest pacemaker GI: abdomen soft, normal BS, no masses, no tenderness, no hepatomegaly, no splenomegaly EXTREMITIES: Able to move 4 extremities PSYCHIATRIC: the patient is alert & oriented to person, affect & behavior appropriate  LABS/RADIOLOGY: Labs reviewed: Basic Metabolic Panel:  Recent Labs  58/83/25 0340  07/27/14 0539  08/05/14 0505 08/06/14 0545 08/07/14 0522  NA 133*  < > 132*  < > 137 139 137  K 4.8  < > 4.1  < > 3.7 3.8 3.9    CL 95*  < > 92*  < > 93* 94* 93*  CO2 23  < > 28  < > 33* 35* 33*  GLUCOSE 108*  < > 89  < > 167* 88 107*  BUN 69*  < > 82*  < > 68* 70* 58*  CREATININE 2.15*  < > 2.26*  < > 2.00* 1.82* 1.59*  CALCIUM 8.6  < > 9.4  < > 8.7* 9.0 9.0  MG 2.2  --  2.2  --   --   --   --   < > = values in this interval not displayed. Liver Function Tests:  Recent Labs  07/26/14 0407 07/27/14 0539 07/28/14 0522  AST 42* 30 26  ALT 51 44 38  ALKPHOS 130* 126 123  BILITOT 1.8* 1.2 1.1  PROT 6.2 6.0* 6.3*  ALBUMIN 2.2* 2.1* 2.1*   CBC:  Recent Labs  02/22/14 0100 02/24/14 1606  07/20/14 0028  08/04/14 0352 08/05/14 0505 08/06/14 0545  WBC 6.7 6.8  < > 12.2*  < > 11.8* 12.0* 15.7*  NEUTROABS 4.9 5.3  --  11.3*  --   --   --   --   HGB 12.6* 13.1  < > 10.2*  < > 9.9* 9.7* 9.8*  HCT 37.4* 40.1  < > 29.9*  < > 29.8* 28.9* 29.3*  MCV 99.7 102.2*  < > 94.9  < > 94.0 93.2 93.9  PLT 218 237.0  < > 242  < > 440* 415* 419*  < > = values in this interval not displayed.  Lipid Panel:  Recent Labs  08/03/14 1630  HDL 54   Cardiac Enzymes:  Recent Labs  02/09/14 1730 02/22/14 0100 05/04/14 1700  TROPONINI <0.30 <0.30 <0.03   CBG:  Recent Labs  01/15/14 0632  GLUCAP 76    Dg Chest 2 View  08/04/2014   CLINICAL DATA:  Short of breath. Weakness. History of pulmonary fibrosis.  EXAM: CHEST  2 VIEW  COMPARISON:  08/01/2014.  FINDINGS: Changes from cardiac surgery are stable. Cardiac silhouette is mildly enlarged. No mediastinal or hilar masses.  Left upper lobe airspace opacity noted previously has improved.  Areas of coarse reticular type opacity in both lungs and at both lung bases is stable consistent with scarring.  No pulmonary edema. Minimal left pleural effusions suggested. No pneumothorax.  IMPRESSION: 1. Improved left upper lobe pneumonia. 2. No new abnormalities.  Persistent areas of lung scarring.   Electronically Signed   By: Amie Portland M.D.   On: 08/04/2014 10:10   Dg Chest 2  View  07/31/2014   CLINICAL DATA:  Acute respiratory failure with hypoxia. Pt hx COPD, CAD  EXAM: CHEST  2 VIEW  COMPARISON:  07/30/2014  FINDINGS: Patchy areas of airspace and interstitial opacities in the lungs are stable from most recent prior exam. No new lung  abnormalities. No significant pleural effusion and no pneumothorax.  Changes from cardiac surgery and aortic valve replacement are stable. Cardiac silhouette is mildly enlarged. No mediastinal or hilar masses. Left anterior chest wall sequential pacemaker is stable and well positioned.  IMPRESSION: No change from the previous day's study. Bilateral patchy airspace and interstitial lung opacities are stable. No new abnormalities.   Electronically Signed   By: Amie Portland M.D.   On: 07/31/2014 13:02   Dg Chest 2 View  07/30/2014   CLINICAL DATA:  Hypoxia.  Weakness.  EXAM: CHEST  2 VIEW  COMPARISON:  07/28/2014.  FINDINGS: There is improvement from 07/28/2014, with partial clearance of the multifocal bilateral airspace opacities although significant opacities persist, left greater than right. Mild right lateral base pleural thickening persists. No significant effusion is evident. There is moderate unchanged cardiomegaly. There is prior sternotomy and aortic valvuloplasty. There are intact appearances of the transvenous leads.  IMPRESSION: Slight improvement. Multifocal bilateral airspace opacities persist, partially cleared.   Electronically Signed   By: Ellery Plunk M.D.   On: 07/30/2014 06:50   Dg Chest 2 View  07/28/2014   CLINICAL DATA:  Respiratory failure .  EXAM: CHEST  2 VIEW  COMPARISON:  07/22/2014 .  CT 07/25/2014.  FINDINGS: Mediastinum and hilar structures are normal. Prior cardiac valve replacement. Cardiac pacer noted with lead tips in stable position. Persistent multifocal bilateral pulmonary infiltrates, no interim change. Small right pleural effusion. No pneumothorax.  IMPRESSION: 1. Persistent multifocal bilateral pulmonary  infiltrates most consistent with pneumonia. No significant interim change.  2. Prior cardiac valve replacement. Cardiac pacer stable position. Stable cardiomegaly .   Electronically Signed   By: Maisie Fus  Register   On: 07/28/2014 08:33   Dg Chest 2 View  07/20/2014   CLINICAL DATA:  Shortness of breath.  Pacemaker.  EXAM: CHEST  2 VIEW  COMPARISON:  05/04/2014.  FINDINGS: Mildly increased cardiac silhouette. Triple lead pacer unchanged from priors. Moderate vascular congestion without overt failure or significant consolidation. Mild to moderate pleural effusions, greater on the RIGHT. Aortic valvular placement with prior median sternotomy. No acute osseous findings.  IMPRESSION: Cardiomegaly with stable appearance of the transvenous pacer.  Moderate vascular congestion without overt failure. Overall improved from prior radiograph in February.   Electronically Signed   By: Davonna Belling M.D.   On: 07/20/2014 02:23   Ct Head Wo Contrast  08/05/2014   CLINICAL DATA:  Intermittent left-sided weakness and difficulty speaking for 1 day  EXAM: CT HEAD WITHOUT CONTRAST  TECHNIQUE: Contiguous axial images were obtained from the base of the skull through the vertex without intravenous contrast.  COMPARISON:  Aug 03, 2014  FINDINGS: Moderate generalized atrophy is stable. There is no intracranial mass, hemorrhage, extra-axial fluid collection, or midline shift. There is evidence of a prior infarct in the right frontal -parietal junction region with some involvement of the superior right temporal lobe as well, stable. There is evidence of a prior inferior, medial right occipital lobe infarct, stable. There is evidence of a prior infarct just superior to the superior most aspect the left lateral ventricle, stable. There is no new gray-white compartment lesion. No acute infarct apparent. Bony calvarium appears intact. The mastoid air cells clear.  IMPRESSION: Atrophy with prior infarcts as noted above. No acute infarct  apparent. No hemorrhage or mass effect. No change from recent prior study.   Electronically Signed   By: Bretta Bang III M.D.   On: 08/05/2014 13:52   Ct  Head Wo Contrast  08/04/2014   CLINICAL DATA:  Episode yesterday of difficulty word finding, left facial on left arm weakness. This was followed by right upper extremity weakness and paresthesias.  EXAM: CT HEAD WITHOUT CONTRAST  TECHNIQUE: Contiguous axial images were obtained from the base of the skull through the vertex without intravenous contrast.  COMPARISON:  CT 08/02/2014, MR 03/26/2013.  FINDINGS: There is no intracranial hemorrhage or evidence of acute infarction. There is no extra-axial fluid collection. There is encephalomalacia due to remote infarction in the right occipital, right frontoparietal and left posterior frontal regions, these remote infarctions are unchanged from the prior MRI. No new infarction is evident. There is no mass, mass effect or midline shift. There is moderate generalized atrophy. No significant bony abnormality is evident. No interval change is evident from yesterday's CT.  IMPRESSION: Remote infarctions as detailed above. No evidence of new infarct or of infarct extension. Moderate chronic atrophy.   Electronically Signed   By: Ellery Plunk M.D.   On: 08/04/2014 02:24   Ct Head Wo Contrast  08/02/2014   CLINICAL DATA:  Right facial droop.  History of stroke.  EXAM: CT HEAD WITHOUT CONTRAST  TECHNIQUE: Contiguous axial images were obtained from the base of the skull through the vertex without intravenous contrast.  COMPARISON:  Head CT dated 03/25/2013 and brain MR dated 03/26/2013.  FINDINGS: Previously demonstrated in bold right occipital, right frontoparietal and small left posterior frontal infarcts are unchanged. Diffusely enlarged ventricles and subarachnoid spaces. No intracranial hemorrhage, mass lesion or CT evidence of acute infarction. Unremarkable bones and included paranasal sinuses.  IMPRESSION: 1.  No acute abnormality. 2. Stable old infarcts, as described above. 3. Stable mild to moderate diffuse cerebral atrophy. These results were called by telephone at the time of interpretation on 08/02/2014 at 9:32 am to Dr. Thad Ranger, who verbally acknowledged these results.   Electronically Signed   By: Beckie Salts M.D.   On: 08/02/2014 09:32   Ct Chest Wo Contrast  07/25/2014   CLINICAL DATA:  Hypoxia and progressive shortness of breath for the past 3 days. Nonproductive cough and wheeze  EXAM: CT CHEST WITHOUT CONTRAST  TECHNIQUE: Multidetector CT imaging of the chest was performed following the standard protocol without IV contrast.  COMPARISON:  None.  FINDINGS: THORACIC INLET/BODY WALL:  There is a biventricular pacer from the left with leads in unremarkable position. No acute findings at the thoracic inlet.  There is an incidental 1 cm intramuscular lipoma in the right chest wall.  MEDIASTINUM:  Cardiomegaly which is chronic based on chest x-rays. No pericardial effusion. The patient is status post median sternotomy with aortic valve replacement. The ascending aorta measures 41 mm in diameter. There is mild enlargement of mediastinal lymph nodes which could be reactive to the presumed pneumonia or sequela of remote granulomatous disease (there is also scattered nodal coarse calcification). No acute vascular findings. Negative esophagus.  LUNG WINDOWS:  There is patchy consolidative and ground-glass opacities in all lobes. Airspace disease is most extensive in the left upper lobe and most dense in the posterior segment right upper lobe. Small bilateral pleural effusions with interlobular septal thickening. No cavitation. No evidence of loculated effusion.  UPPER ABDOMEN:  Granulomatous changes in the liver and spleen.  OSSEOUS:  No acute findings.  IMPRESSION: 1. Multilobar pneumonia. 2. Superimposed interstitial edema and small pleural effusions. 3. Chronic and postoperative findings noted above.    Electronically Signed   By: Kathrynn Ducking.D.  On: 07/25/2014 08:00   Dg Esophagus  07/29/2014   CLINICAL DATA:  Dysphagia.  Choking.  Bilateral pneumonia.  EXAM: ESOPHOGRAM/BARIUM SWALLOW  TECHNIQUE: Combined double contrast and single contrast examination performed using effervescent crystals, thick barium liquid, and thin barium liquid.  FLUOROSCOPY TIME:  Radiation Exposure Index (as provided by the fluoroscopic device): Dose area product: 829.79uGy*m^2  COMPARISON:  Radiographs 07/28/2014.  Chest CT 07/25/2014.  FINDINGS: Pharyngeal phase of swallowing was normal. The study was carried out in the AP projection due to patient condition. Nonspecific esophageal dysmotility was present without stricture. 0/3 primary waves reach the gastroesophageal junction. Tertiary contractions were observed. Fetal on esophagus incidentally noted.  There is no bear ear passage of the 13 mm barium tablet challenge however the tablet remain static in the lower esophagus just proximal to the gastroesophageal junction for several min. No gastroesophageal reflux could be elicited with water siphon test.  Esophageal stasis was present in the supine position. Esophageal folds appeared within normal limits.  IMPRESSION: 1. No esophageal stricture or mass. 2. Nonspecific esophageal dysmotility disorder with with tertiary contractions. Stasis in the supine position.   Electronically Signed   By: Andreas Newport M.D.   On: 07/29/2014 09:39   US Renal  07/20/2014   CLINICAL DATA:  Acute kidney injury.  Hypertension.  EXAM: RENAL/URINARY TRACT ULTRASOUND COMPLETE  COMPARISON:  None.  FINDINGS: Right Kidney:  Length: 10.3 cm. Echogenicity within normal limits. No mass or hydronephrosis visualized. Diffuse cortical thinning with prominent renal sinus fat.  Left Kidney:  Length: 11.1 cm. Echogenicity within normal limits. No mass or hydronephrosis visualized. Diffuse cortical thinning with prominent renal sinus fat.  Bladder:  Multiple  diverticula. The largest measures 4.9 cm in maximum diameter. Small amount of internal debris with no definite calculi  IMPRESSION: 1. Diffuse bilateral renal cortical atrophy. 2. Multiple bladder diverticula. 3. Small amount of debris in the bladder with no definite calculi.   Electronically Signed   By: Beckie Salts M.D.   On: 07/20/2014 12:27   Dg Chest Port 1 View  08/01/2014   CLINICAL DATA:  81 year old male with a history of pulmonary infiltrates and shortness of breath.  EXAM: PORTABLE CHEST - 1 VIEW  COMPARISON:  Multiple prior, most recent 07/31/2014, 07/30/2014  FINDINGS: Cardiomediastinal silhouette unchanged.  Atherosclerosis.  Unchanged cardiac pacing device with 3 leads in place.  Bilateral airspace disease, involving the right greater than left bases and the left greater than right apices. Distribution is similar to comparison plain film. Over the course of several chest x-ray this has improved, dating to 07/22/2014 and 07/20/2014.  Trace pleural effusions.  Calcified right hilar lymph nodes.  IMPRESSION: Persisting bilateral airspace disease, which is unchanged since the most recent plain films though has improved since the comparisons of 07/20/2014.  Persisting small pleural effusions.  Signed,  Yvone Neu. Loreta Ave, DO  [facility]  [facility]   Electronically Signed   By: Gilmer Mor D.O.   On: 08/01/2014 07:19   Dg Chest Port 1 View  07/22/2014   CLINICAL DATA:  Acute onset of hypoxemia.  Initial encounter.  EXAM: PORTABLE CHEST - 1 VIEW  COMPARISON:  Chest radiograph performed 07/20/2014  FINDINGS: The lungs are well-aerated. Worsening bilateral midlung airspace opacification raises concern for multifocal pneumonia. Small bilateral pleural effusions are seen, and underlying interstitial edema cannot be excluded. Vascular congestion is noted. No pneumothorax is seen.  The cardiomediastinal silhouette is mildly enlarged. The patient is status post  median sternotomy. A pacemaker is noted overlying the left chest wall, with leads ending overlying the right atrium and right ventricle. No acute osseous abnormalities are seen.  IMPRESSION: 1. Worsening bilateral mid lung airspace opacification raises concern for multifocal pneumonia. 2. Small bilateral pleural effusions, with vascular congestion and mild cardiomegaly. Underlying interstitial edema cannot be excluded.   Electronically Signed   By: Roanna Raider M.D.   On: 07/22/2014 04:30    ASSESSMENT/PLAN:  Physical deconditioning - for rehabilitation Organizing Pneumonia -  wean off O2 as tolerated; continue Prednisone taper; hold Amiodarone indefinitely Acute on chronic systolic CHF - continue Demadex 40 mg by mouth every morning and 20 mg by mouth every afternoon, Coreg 2.125 mg by mouth twice a day and Lipitor 40 mg by mouth daily; not on ACEi/ARB due to CKD; follow-up with cardiology, Dr. Shirlee Latch, on 08/15/14 Acute on chronic kidney disease, stage III - creatinine 1.59; will monitor History of CVA - CT showed bilateral infarcts but no acute abnormality, repeat CT showed no new infarcts; 2-D echo EF 25-30% ; continue Plavix 75 mg by mouth daily and Coumadin Atrial fibrillation - has biventricular pacemaker; amiodarone on hold indefinitely; CHDS 5; continue Coreg and Coumadin GERD - continue Prilosec 40 mg by mouth every morning Protein calorie malnutrition, severe - albumin 2.9; continue supplementation BPH - continue Flomax 0.4 mg 2 capsules by mouth Q HS Leukocytosis - wbc 15.7; probably due to steroids; will monitor Carotid stenosis - S/P right CEA; left high-grade ICA stenosis but failed stenting; vascular surgery consultation done and does not recommend any further intervention at this time; recommends repeating carotid duplex in 1 year for follow-up Hypertension - well controlled; continue Coreg 3.125 mg by mouth twice a day Hyperlipidemia - continue Lipitor 40 mg by mouth  daily Long-term use of anticoagulant - INR 2.6, therapeutic; continue Coumadin 2 mg 1 tab by mouth daily and repeat INR on 08/12/58   Goals of care:  Short-term rehabilitation  Spent 50 minutes in patient care.    [facility], NP [employer] [phone]

## 2014-08-15 ENCOUNTER — Telehealth: Payer: Self-pay | Admitting: Internal Medicine

## 2014-08-15 ENCOUNTER — Encounter: Payer: Self-pay | Admitting: Internal Medicine

## 2014-08-15 ENCOUNTER — Ambulatory Visit (HOSPITAL_COMMUNITY)
Admit: 2014-08-15 | Discharge: 2014-08-15 | Disposition: A | Discharge status: Home / Self Care | Payer: Medicare HMO | Source: Ambulatory Visit | Attending: Cardiology | Admitting: Cardiology

## 2014-08-15 ENCOUNTER — Encounter (HOSPITAL_COMMUNITY): Payer: Self-pay

## 2014-08-15 VITALS — BP 120/66 | HR 85 | Wt 154.2 lb

## 2014-08-15 DIAGNOSIS — Z8673 Personal history of transient ischemic attack (TIA), and cerebral infarction without residual deficits: Secondary | ICD-10-CM | POA: Diagnosis not present

## 2014-08-15 DIAGNOSIS — E785 Hyperlipidemia, unspecified: Secondary | ICD-10-CM | POA: Insufficient documentation

## 2014-08-15 DIAGNOSIS — Z79899 Other long term (current) drug therapy: Secondary | ICD-10-CM | POA: Diagnosis not present

## 2014-08-15 DIAGNOSIS — I252 Old myocardial infarction: Secondary | ICD-10-CM | POA: Insufficient documentation

## 2014-08-15 DIAGNOSIS — R918 Other nonspecific abnormal finding of lung field: Secondary | ICD-10-CM | POA: Diagnosis not present

## 2014-08-15 DIAGNOSIS — I480 Paroxysmal atrial fibrillation: Secondary | ICD-10-CM | POA: Diagnosis not present

## 2014-08-15 DIAGNOSIS — I429 Cardiomyopathy, unspecified: Secondary | ICD-10-CM | POA: Diagnosis not present

## 2014-08-15 DIAGNOSIS — Z7902 Long term (current) use of antithrombotics/antiplatelets: Secondary | ICD-10-CM | POA: Diagnosis not present

## 2014-08-15 DIAGNOSIS — I5042 Chronic combined systolic (congestive) and diastolic (congestive) heart failure: Secondary | ICD-10-CM | POA: Insufficient documentation

## 2014-08-15 DIAGNOSIS — I639 Cerebral infarction, unspecified: Secondary | ICD-10-CM

## 2014-08-15 DIAGNOSIS — E559 Vitamin D deficiency, unspecified: Secondary | ICD-10-CM | POA: Diagnosis not present

## 2014-08-15 DIAGNOSIS — Z87891 Personal history of nicotine dependence: Secondary | ICD-10-CM | POA: Diagnosis not present

## 2014-08-15 DIAGNOSIS — I5022 Chronic systolic (congestive) heart failure: Secondary | ICD-10-CM

## 2014-08-15 DIAGNOSIS — Z7901 Long term (current) use of anticoagulants: Secondary | ICD-10-CM | POA: Diagnosis not present

## 2014-08-15 DIAGNOSIS — I2510 Atherosclerotic heart disease of native coronary artery without angina pectoris: Secondary | ICD-10-CM | POA: Diagnosis not present

## 2014-08-15 DIAGNOSIS — J449 Chronic obstructive pulmonary disease, unspecified: Secondary | ICD-10-CM | POA: Diagnosis not present

## 2014-08-15 DIAGNOSIS — Z953 Presence of xenogenic heart valve: Secondary | ICD-10-CM | POA: Diagnosis not present

## 2014-08-15 DIAGNOSIS — Z950 Presence of cardiac pacemaker: Secondary | ICD-10-CM | POA: Insufficient documentation

## 2014-08-15 DIAGNOSIS — N183 Chronic kidney disease, stage 3 (moderate): Secondary | ICD-10-CM | POA: Diagnosis not present

## 2014-08-15 MED ORDER — CARVEDILOL 6.25 MG PO TABS: 6.2500 mg | ORAL_TABLET | Freq: Two times a day (BID) | ORAL | Status: DC

## 2014-08-15 MED ORDER — SPIRONOLACTONE 25 MG PO TABS: 12.5000 mg | ORAL_TABLET | Freq: Every day | ORAL | Status: DC

## 2014-08-15 NOTE — Patient Instructions (Signed)
TAKE Torsemide 40 mg twice a day for 5 days THEN take 40 mg in the am and 20 mg pm.  START Spironolactone 12.5 (1/2 tablet) daily.  INCREASE Coreg to 6.25 mg twice a day.  FOLLOW UP in  2 weeks.

## 2014-08-15 NOTE — Telephone Encounter (Signed)
Spoke with wife- clarified that today's transmission was received and will be processed. No need to send another transmission on 08/19/14. Verbalized understanding.

## 2014-08-15 NOTE — Telephone Encounter (Signed)
New Message       Pt's wife calling stating that she plugged pt's monitor into the wall and it asked if they wanted to send a signal and she did so she is wanting to know if the pt still needs to send a signal on 08/19/14. Please call back and advise.

## 2014-08-17 NOTE — Progress Notes (Signed)
Patient ID: Timothy Durham, male   DOB: February 16, 1931, 81 y.o.   MRN: [id number] PCP: Dr Timothy Durham Primary Cardiologist: Dr. Ladona Durham  HPI: Timothy Durham is an 81 yo male with a history of prior CVA in 2000 secondary to AF, nonischemic cardiomoyopathy s/p Medtronic CRT-P (02/17/14), chronic systolic HF, non-obstructive CAD, HTN, CKD stage III, HLD, PAD and bioprosthetic AVR.   Admitted to [facility] 2/7 through 05/06/14 with volume overload. Diuresed with IV lasix and later transitioned to 40 mg torsemide in am and 20 mg in pm. Also started on home oxygen at night. Discharge weight was 143 pounds.    He was admitted in 5/16 with dyspnea, fever, chills and found to have PNA on CXR and multifocal airspace disease on CT.  He require oxygen.  There was concern for aspiration PNA versus amiodarone lung toxicity (organizing PNA related to amiodarone).  Amiodarone was stopped and he was put on prednisone, which he continues today.  His breathing and oxygen saturation gradually improved.  While in the hospital, he had TIAs.  He had been off Plavix for possible bronchoscopy, which ended up not being done.  He had carotid dopplers and was evaluated by vascular who thought that the TIAs were not likely to be related to carotid stenosis (likely related to atrial fibrillation history). He is now back on Plavix and warfarin.  Hydralazine/Imdur and spironolactone were stopped to promote higher BP in the setting of TIAs.   He was sent to [facility] after discharge and is going home today.  He is only using oxygen at night now.  He is weak but thinks that his breathing is better.  He has a chronic foot drop that has worsened recently, and he is using a walker.  Dyspnea when walking long distances but generally ok. No chest pain.  No orthopnea/PND.   Studies: RHC (01/14/2014): RA 12, RV 42/11 (16), PA 42/23 (25), PCWP 17, PA 61%, Fick CO/CI: 4.53/2.27 LHC (01/14/1014): moderate non-obstructive CAD with 50% CFX  Echo (12/2013): EF 30-35%,  grade III DD, AV bioprosthetic valve nl, V max: 0.96 cm^2, mod/severe MR, RV sys. fx moderately reduced, TR mild Carotid dopplers (5/16) with 40-60% left carotid stenosis, < 40% right carotid stenosis (CEA side).   Labs: 03/10/2014  K 5.8 Cr 1.68 04/08/2014  K 4.0 Cr 1.60 Na 136 05/06/2014 K 3.3 Creatinine 2.08  05/12/2014 K 4.1 Creatinine 2.1  5/16 LDL 80, HCT 29.3, K 3.9, creatinine 1.59  ROS: All systems negative except as listed in HPI, PMH and Problem List.  SH:  History   Social History  . Marital Status: Married    Spouse Name: N/A  . Number of Children: N/A  . Years of Education: N/A   Occupational History  . Not on file.   Social History Main Topics  . Smoking status: Former Smoker -- 2.00 packs/day for 35 years    Types: Cigarettes    Quit date: 09/26/1998  . Smokeless tobacco: Never Used  . Alcohol Use: 0.6 oz/week    1 Cans of beer per week     Comment: drinks one can of beer or wine daily.  . Drug Use: No  . Sexual Activity: Not on file   Other Topics Concern  . Not on file   Social History Narrative    FH:  Family History  Problem Relation Age of Onset  . Stroke Mother   . Cancer Father     Past Medical History  Diagnosis Date  . Arthritis   .  Non-obstructive CAD     a. 12/2013 NSTEMI/Cath: LM nl, LAD 20, LCX 40-50, RCA 20.  Marland Kitchen Hypertension   . Stroke     a. July 2000;  b. January 2007;  c. TIA in 2014.  . CKD (chronic kidney disease), stage III   . GERD (gastroesophageal reflux disease)   . Foot pain, bilateral   . H/O hematuria   . Hyperlipidemia   . Vitamin D deficiency   . Chronic combined systolic and diastolic CHF (congestive heart failure)     a. 12/2013 Echo: EF 30-35%, mod conc LVH, Gr 3 DD, mild AI, mod-sev MR, mod dil LA, mild TR.  Marland Kitchen PAF (paroxysmal atrial fibrillation)     a. CHA2DS2VASc = 7--> Chronic Coumadin.  Marland Kitchen NICM (nonischemic cardiomyopathy)     a. 12/2013 Echo: EF 30-35%.  . Aortic valve prosthesis present     a. 2006:  S/P bioprosthetic AVR.  Marland Kitchen PAD (peripheral artery disease)     a. s/p LLE stenting.  . Carotid arterial disease     a. s/p R CEA  . History of tobacco abuse   . Moderate to Severe Mitral Regurgitation     a. 12/2013 Echo: Mod-Sev MR.  Marland Kitchen Anxiety   . COPD (chronic obstructive pulmonary disease)   . Coronary arteriosclerosis Oct 2015    med Rx  . Shortness of breath dyspnea   . Depression   . Presence of permanent cardiac pacemaker   . Heart murmur   . Dysrhythmia     Current Outpatient Prescriptions  Medication Sig Dispense Refill  . acetaminophen (TYLENOL) 500 MG tablet Take 1,000 mg by mouth at bedtime.     Marland Kitchen allopurinol (ZYLOPRIM) 100 MG tablet Take 200 mg by mouth daily.    Marland Kitchen atorvastatin (LIPITOR) 40 MG tablet Take 40 mg by mouth every evening.    . Calcium Carb-Cholecalciferol (CALCIUM 600 + D PO) Take 2 tablets by mouth every morning.    . carvedilol (COREG) 6.25 MG tablet Take 1 tablet (6.25 mg total) by mouth 2 (two) times daily with a meal. 60 tablet 3  . clopidogrel (PLAVIX) 75 MG tablet Take 75 mg by mouth daily.    . DULoxetine (CYMBALTA) 60 MG capsule Take 60 mg by mouth daily.     . Omega-3 Fatty Acids (FISH OIL) 1200 MG CAPS Take 1 capsule by mouth every morning.    Marland Kitchen omeprazole (PRILOSEC) 40 MG capsule Take 40 mg by mouth every morning.    . potassium chloride SA (K-DUR,KLOR-CON) 20 MEQ tablet Take 1 tablet (20 mEq total) by mouth as needed. TAKE ONLY WHEN YOU TAKE METOLAZONE 30 tablet 3  . predniSONE (DELTASONE) 10 MG tablet Take 1 tablet (10 mg total) by mouth as directed. Take 4 tabs orally for the next 4 days, then take 3 tabs orally for the next 4 days, then take 2 tabs orally for the next 4 days, then take 1 tab orally for the next 4 days. 30 tablet 0  . tamsulosin (FLOMAX) 0.4 MG CAPS capsule Take 2 capsules (0.8 mg total) by mouth at bedtime. 60 capsule 0  . torsemide (DEMADEX) 20 MG tablet Take 2 tablets (40 mg total) by mouth daily. Take 40 mg in am and 20 mg  in pm 60 tablet 0  . warfarin (COUMADIN) 4 MG tablet Take 2 mg by mouth daily at 6 PM.     . spironolactone (ALDACTONE) 25 MG tablet Take 0.5 tablets (12.5 mg total) by mouth daily.  15 tablet 3   No current facility-administered medications for this encounter.    Filed Vitals:   08/15/14 1154  BP: 120/66  Pulse: 85  Weight: 154 lb 4 oz (69.967 kg)  SpO2: 96%   PHYSICAL EXAM: General:  Elderly appearing. No resp difficulty Wife present  HEENT: normal Neck: supple. JVP 8-9 cm. Carotids 2+ bilaterally; no bruits. No lymphadenopathy or thryomegaly appreciated. Cor: PMI normal. Regular rate & rhythm. No rubs, gallops. 2/6 HSM LLSB/apex Lungs: clear  Abdomen: soft, nontender, nondistended. No hepatosplenomegaly. No bruits or masses. Good bowel sounds. Extremities: no cyanosis, clubbing, rash.  2+ ankle edema bilaterally Neuro: alert & orientedx3, cranial nerves grossly intact. Moves all 4 extremities w/o difficulty. Affect pleasant  ASSESSMENT & PLAN: 1) Chronic combined systolic/diastolic HF: Nonischemic cardiomyopathy s/p Medtronic CRT-P.  Echo with EF 30-35%, grade III DD, mod/severe MR, RV sys fx mod reduced (12/2013). NYHA II-III symptoms.  He has some volume overload.   - Increase torsemide to 40 mg bid x 5 days then back to 40 qam/20 qpm. Repeat BMET/BNP in 2 wks.  - Restart spironolactone 12.5 daily and increase Coreg to 6.25 mg bid.    - Will continue to hold hydralazine/Imdur, likely restart in the future.  2) PAF:  CHADVASC score = 5. NSR by exam. Off amiodarone with possible lung toxicity.  On coumadin and plavix daily (had TIAs on coumadin alone).   3) CKD stage III: Stable. BMET 10 days.  4) Bioprosthetic AVR: Stable on recent echo. Needs endocarditis prophylaxis with dental work.  5) Suspected OSA: Day time fatigue.  Need to arrange sleep study.  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  Continue prednisone taper.  Not using oxygen during  the day.  Has pulmonary followup.   Followup in 2 wks.    Marca Ancona 08/17/2014

## 2014-08-18 ENCOUNTER — Ambulatory Visit (INDEPENDENT_AMBULATORY_CARE_PROVIDER_SITE_OTHER): Payer: Medicare HMO | Admitting: Adult Health

## 2014-08-18 ENCOUNTER — Ambulatory Visit (INDEPENDENT_AMBULATORY_CARE_PROVIDER_SITE_OTHER)
Admission: RE | Admit: 2014-08-18 | Discharge: 2014-08-18 | Disposition: A | Discharge status: Home / Self Care | Payer: Medicare HMO | Source: Ambulatory Visit | Attending: Adult Health | Admitting: Adult Health

## 2014-08-18 ENCOUNTER — Encounter: Payer: Self-pay | Admitting: Adult Health

## 2014-08-18 VITALS — BP 124/54 | HR 73 | Temp 97.3°F | Ht 70.0 in | Wt 152.0 lb

## 2014-08-18 DIAGNOSIS — J189 Pneumonia, unspecified organism: Secondary | ICD-10-CM

## 2014-08-18 DIAGNOSIS — J708 Respiratory conditions due to other specified external agents: Secondary | ICD-10-CM

## 2014-08-18 DIAGNOSIS — T462X5A Adverse effect of other antidysrhythmic drugs, initial encounter: Secondary | ICD-10-CM

## 2014-08-18 DIAGNOSIS — J441 Chronic obstructive pulmonary disease with (acute) exacerbation: Secondary | ICD-10-CM

## 2014-08-18 DIAGNOSIS — J984 Other disorders of lung: Secondary | ICD-10-CM

## 2014-08-18 NOTE — Patient Instructions (Signed)
Taper Prednisone as directed.  Wear Oxygen 2l/m At bedtime .  follow up Dr. Vassie Loll  In [place] [facility] in 4 weeks and As needed   PFT before office visit with Dr. Vassie Loll

## 2014-08-19 ENCOUNTER — Ambulatory Visit (INDEPENDENT_AMBULATORY_CARE_PROVIDER_SITE_OTHER): Payer: Medicare HMO | Admitting: *Deleted

## 2014-08-19 DIAGNOSIS — I5022 Chronic systolic (congestive) heart failure: Secondary | ICD-10-CM

## 2014-08-19 DIAGNOSIS — I428 Other cardiomyopathies: Secondary | ICD-10-CM

## 2014-08-19 NOTE — Progress Notes (Signed)
Remote pacemaker transmission.   

## 2014-08-20 ENCOUNTER — Telehealth (HOSPITAL_COMMUNITY): Payer: Self-pay | Admitting: *Deleted

## 2014-08-20 LAB — CUP PACEART REMOTE DEVICE CHECK
Battery Remaining Longevity: 75 mo
Brady Statistic AP VP Percent: 7.42 %
Brady Statistic AS VS Percent: 1.76 %
Brady Statistic RV Percent Paced: 98.19 %
Lead Channel Impedance Value: 323 Ohm
Lead Channel Impedance Value: 361 Ohm
Lead Channel Impedance Value: 380 Ohm
Lead Channel Impedance Value: 551 Ohm
Lead Channel Impedance Value: 551 Ohm
Lead Channel Pacing Threshold Amplitude: 0.5 V
Lead Channel Sensing Intrinsic Amplitude: 1.625 mV
Lead Channel Setting Pacing Amplitude: 2 V
Lead Channel Setting Pacing Amplitude: 2.5 V
Lead Channel Setting Pacing Pulse Width: 0.8 ms
Lead Channel Setting Sensing Sensitivity: 0.9 mV
MDC IDC MSMT BATTERY VOLTAGE: 3.02 V
MDC IDC MSMT LEADCHNL LV IMPEDANCE VALUE: 437 Ohm
MDC IDC MSMT LEADCHNL LV IMPEDANCE VALUE: 665 Ohm
MDC IDC MSMT LEADCHNL LV IMPEDANCE VALUE: 665 Ohm
MDC IDC MSMT LEADCHNL LV PACING THRESHOLD AMPLITUDE: 0.75 V
MDC IDC MSMT LEADCHNL LV PACING THRESHOLD PULSEWIDTH: 0.4 ms
MDC IDC MSMT LEADCHNL RA PACING THRESHOLD PULSEWIDTH: 0.4 ms
MDC IDC MSMT LEADCHNL RV IMPEDANCE VALUE: 494 Ohm
MDC IDC MSMT LEADCHNL RV PACING THRESHOLD AMPLITUDE: 0.875 V
MDC IDC MSMT LEADCHNL RV PACING THRESHOLD PULSEWIDTH: 0.4 ms
MDC IDC MSMT LEADCHNL RV SENSING INTR AMPL: 23.375 mV
MDC IDC SESS DTM: 20160520191508
MDC IDC SET LEADCHNL LV PACING AMPLITUDE: 2.25 V
MDC IDC SET LEADCHNL RV PACING PULSEWIDTH: 0.4 ms
MDC IDC STAT BRADY AP VS PERCENT: 0.05 %
MDC IDC STAT BRADY AS VP PERCENT: 90.77 %
MDC IDC STAT BRADY RA PERCENT PACED: 7.47 %
Zone Setting Detection Interval: 400 ms
Zone Setting Detection Interval: 400 ms

## 2014-08-20 NOTE — Telephone Encounter (Signed)
appt scheduled with [facility] for 09/03/14 at 10:30am

## 2014-08-21 NOTE — Assessment & Plan Note (Signed)
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  Improved with steroid challenge.   Plan  taper Prednisone as directed.  Wear Oxygen 2l/m At bedtime .  follow up Dr. Vassie Loll  In [facility] in 4 weeks and As needed   PFT before office visit with Dr. Vassie Loll

## 2014-08-21 NOTE — Progress Notes (Signed)
   Subjective:    Patient ID: Timothy Durham, male    DOB: November 03, 1930, 81 y.o.   MRN: [id number]  HPI 81 yo male former smoker seen for pulmonary consult during hospitalization 07/25/14 for pulmonary infiltrates.  Has nonischemic CM with EF of 30-35%.   08/18/14 Post Hospital Follow up  Pt presents for a post hospital follow up .  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  Initially tx for PNA with abx. Seen by pulmonary started on prednisone for suspected amiodarone toxicity.  CT chest showed diffuse GGO .  Since discharge he is feeling better Pt states breathing has improved.  Pt c/o occasional dry cough and sinus drainage.  Denies any SOB, wheezing, chest tightness/congestion, fever.    Review of Systems Constitutional:   No  weight loss, night sweats,  Fevers, chills,  +fatigue, or  lassitude.  HEENT:   No headaches,  Difficulty swallowing,  Tooth/dental problems, or  Sore throat,                No sneezing, itching, ear ache, nasal congestion, post nasal drip,   CV:  No chest pain,  Orthopnea, PND, swelling in lower extremities, anasarca, dizziness, palpitations, syncope.   GI  No heartburn, indigestion, abdominal pain, nausea, vomiting, diarrhea, change in bowel habits, loss of appetite, bloody stools.   Resp:    No chest wall deformity  Skin: no rash or lesions.  GU: no dysuria, change in color of urine, no urgency or frequency.  No flank pain, no hematuria   MS:  No joint pain or swelling.  No decreased range of motion.  No back pain.  Psych:  No change in mood or affect. No depression or anxiety.  No memory loss.         Objective:   Physical Exam GEN: A/Ox3; pleasant , NAD,  Elderly   HEENT:  /AT,  EACs-clear, TMs-wnl, NOSE-clear, THROAT-clear, no lesions, no postnasal drip or exudate noted.   NECK:  Supple w/ fair ROM; no JVD; normal carotid impulses w/o bruits; no  thyromegaly or nodules palpated; no lymphadenopathy.  RESP  Decreased BS in bases , no accessory muscle use, no dullness to percussion  CARD:  RRR, no m/r/g  , no peripheral edema, pulses intact, no cyanosis or clubbing.  GI:   Soft & nt; nml bowel sounds; no organomegaly or masses detected.  Musco: Warm bil, no deformities or joint swelling noted.   Neuro: alert, no focal deficits noted.    Skin: Warm, no lesions or rashes    CT chest 4/29: diffuse patchy GG opacifications bilaterally, small bilateral pleural effusions     Assessment & Plan:

## 2014-08-21 NOTE — Assessment & Plan Note (Signed)
?   COPD with previous heavy smoking  Need PFT   Plan  Taper Prednisone as directed.  Wear Oxygen 2l/m At bedtime .  follow up Dr. Vassie Loll  In [facility] in 4 weeks and As needed   PFT before office visit with Dr. Vassie Loll

## 2014-08-22 ENCOUNTER — Other Ambulatory Visit (HOSPITAL_COMMUNITY): Payer: Self-pay | Admitting: *Deleted

## 2014-08-22 ENCOUNTER — Telehealth (HOSPITAL_COMMUNITY): Payer: Self-pay

## 2014-08-22 MED ORDER — CARVEDILOL 6.25 MG PO TABS: 3.1250 mg | ORAL_TABLET | Freq: Two times a day (BID) | ORAL | Status: DC

## 2014-08-22 MED ORDER — SPIRONOLACTONE 25 MG PO TABS: 12.5000 mg | ORAL_TABLET | Freq: Every day | ORAL | Status: DC

## 2014-08-22 NOTE — Telephone Encounter (Signed)
Patient's wife reports patient being very lightheaded since his apt last week when torsemide was increased x 5 days to 40mg  am/40mg  pm , which is now reduced back down to normal daily dose of 40mg  am/20mg  pm.  Also restarted spironolactone at 12.5mg  once daily and increased carvedilol to 6.25mg  twice daily.  BP 107/58, pulse 70-90s, O2 95% on 2L, weight 146.2 lbs (was 153.6 lbs at DC).  Per Dr. Shirlee Latch, advised patient to decrease Carvedilol back down to 3.125mg  twice daily and drink an extra glass of water today.  Patient has apt with the HF clinic next week 08/28/14 to follow up further.  Aware and agreeable to plan.  Ave Filter

## 2014-08-27 ENCOUNTER — Encounter: Payer: Self-pay | Admitting: Cardiology

## 2014-08-28 ENCOUNTER — Ambulatory Visit (HOSPITAL_COMMUNITY)
Admission: RE | Admit: 2014-08-28 | Discharge: 2014-08-28 | Disposition: A | Discharge status: Home / Self Care | Payer: Medicare HMO | Source: Ambulatory Visit | Attending: Cardiology | Admitting: Cardiology

## 2014-08-28 ENCOUNTER — Encounter (HOSPITAL_COMMUNITY): Payer: Self-pay

## 2014-08-28 VITALS — BP 131/31 | HR 91 | Resp 20 | Wt 154.2 lb

## 2014-08-28 DIAGNOSIS — I5042 Chronic combined systolic (congestive) and diastolic (congestive) heart failure: Secondary | ICD-10-CM | POA: Insufficient documentation

## 2014-08-28 DIAGNOSIS — G459 Transient cerebral ischemic attack, unspecified: Secondary | ICD-10-CM | POA: Diagnosis not present

## 2014-08-28 DIAGNOSIS — N179 Acute kidney failure, unspecified: Secondary | ICD-10-CM

## 2014-08-28 DIAGNOSIS — N183 Chronic kidney disease, stage 3 unspecified: Secondary | ICD-10-CM

## 2014-08-28 DIAGNOSIS — I480 Paroxysmal atrial fibrillation: Secondary | ICD-10-CM

## 2014-08-28 DIAGNOSIS — Z7901 Long term (current) use of anticoagulants: Secondary | ICD-10-CM | POA: Diagnosis not present

## 2014-08-28 DIAGNOSIS — I5022 Chronic systolic (congestive) heart failure: Secondary | ICD-10-CM

## 2014-08-28 DIAGNOSIS — R339 Retention of urine, unspecified: Secondary | ICD-10-CM

## 2014-08-28 DIAGNOSIS — Z7902 Long term (current) use of antithrombotics/antiplatelets: Secondary | ICD-10-CM | POA: Insufficient documentation

## 2014-08-28 DIAGNOSIS — I429 Cardiomyopathy, unspecified: Secondary | ICD-10-CM | POA: Diagnosis not present

## 2014-08-28 DIAGNOSIS — Z8673 Personal history of transient ischemic attack (TIA), and cerebral infarction without residual deficits: Secondary | ICD-10-CM | POA: Insufficient documentation

## 2014-08-28 LAB — BASIC METABOLIC PANEL
ANION GAP: 8 (ref 5–15)
BUN: 32 mg/dL — ABNORMAL HIGH (ref 6–20)
CO2: 30 mmol/L (ref 22–32)
Calcium: 8.9 mg/dL (ref 8.9–10.3)
Chloride: 98 mmol/L — ABNORMAL LOW (ref 101–111)
Creatinine, Ser: 1.71 mg/dL — ABNORMAL HIGH (ref 0.61–1.24)
GFR calc Af Amer: 41 mL/min — ABNORMAL LOW (ref >60)
GFR, EST NON AFRICAN AMERICAN: 36 mL/min — AB (ref >60)
Glucose, Bld: 97 mg/dL (ref 65–99)
Potassium: 4.4 mmol/L (ref 3.5–5.1)
Sodium: 136 mmol/L (ref 135–145)

## 2014-08-28 LAB — CBC
HCT: 31.3 % — ABNORMAL LOW (ref 39.0–52.0)
Hemoglobin: 10.1 g/dL — ABNORMAL LOW (ref 13.0–17.0)
MCH: 30.8 pg (ref 26.0–34.0)
MCHC: 32.3 g/dL (ref 30.0–36.0)
MCV: 95.4 fL (ref 78.0–100.0)
PLATELETS: 247 10*3/uL (ref 150–400)
RBC: 3.28 MIL/uL — AB (ref 4.22–5.81)
RDW: 17.3 % — AB (ref 11.5–15.5)
WBC: 15.8 10*3/uL — ABNORMAL HIGH (ref 4.0–10.5)

## 2014-08-28 LAB — URINE MICROSCOPIC-ADD ON

## 2014-08-28 LAB — URINALYSIS, ROUTINE W REFLEX MICROSCOPIC
BILIRUBIN URINE: NEGATIVE
Glucose, UA: NEGATIVE mg/dL
KETONES UR: NEGATIVE mg/dL
Nitrite: POSITIVE — AB
Protein, ur: NEGATIVE mg/dL
Specific Gravity, Urine: <1.005 — ABNORMAL LOW (ref 1.005–1.030)
Urobilinogen, UA: 0.2 mg/dL (ref 0.0–1.0)
pH: 6.5 (ref 5.0–8.0)

## 2014-08-28 LAB — BRAIN NATRIURETIC PEPTIDE: B NATRIURETIC PEPTIDE 5: 1480.8 pg/mL — AB (ref 0.0–100.0)

## 2014-08-28 MED ORDER — TORSEMIDE 20 MG PO TABS: 40.0000 mg | ORAL_TABLET | Freq: Two times a day (BID) | ORAL | Status: DC

## 2014-08-28 NOTE — Patient Instructions (Signed)
INCREASE Torsemide to 40mg  twice a day.  LABS today (cbc bmet bnp U/A)  Your provider has placed a referral to urology.  FOLLOW UP in 2 weeks.

## 2014-08-28 NOTE — Progress Notes (Signed)
Patient ID: Timothy Durham, male   DOB: 1931/02/10, 81 y.o.   MRN: [id number]  PCP: Dr Luiz Iron Primary Cardiologist: Dr. Ladona Ridgel  HPI: Timothy Durham is an 81 yo male with a history of prior CVA in 2000 secondary to AF, nonischemic cardiomoyopathy s/p Medtronic CRT-P (02/17/14), chronic systolic HF, non-obstructive CAD, HTN, CKD stage III, HLD, PAD and bioprosthetic AVR.   Admitted to [facility] 2/7 through 05/06/14 with volume overload. Diuresed with IV lasix and later transitioned to 40 mg torsemide in am and 20 mg in pm. Also started on home oxygen at night. Discharge weight was 143 pounds.    He was admitted in 5/16 with dyspnea, fever, chills and found to have PNA on CXR and multifocal airspace disease on CT.  He require oxygen.  There was concern for aspiration PNA versus amiodarone lung toxicity (organizing PNA related to amiodarone).  Amiodarone was stopped and he was put on prednisone, which he continues today.  His breathing and oxygen saturation gradually improved.  While in the hospital, he had TIAs.  He had been off Plavix for possible bronchoscopy, which ended up not being done.  He had carotid dopplers and was evaluated by vascular who thought that the TIAs were not likely to be related to carotid stenosis (likely related to atrial fibrillation history). He is now back on Plavix and warfarin.  Hydralazine/Imdur and spironolactone were stopped to promote higher BP in the setting of TIAs.   He returns for HF follow up. Last visit volume overload was noted and torsemide was increased to 40 mg twice a day for the 5 days then back to 40 mg in am and 20 mg in pm. He was also started on spiro and carvedilol was increased to 6.25 mg twice a day. Weight has been 146-150 pounds. Complains of fatigue. Overall breathing ok. Mild dyspnea with ADLs. Wife prepares his medications. Having difficulty urinating. Has follow up nephrologist.   Studies: RHC (01/14/2014): RA 12, RV 42/11 (16), PA 42/23 (25), PCWP 17, PA 61%,  Fick CO/CI: 4.53/2.27 LHC (01/14/1014): moderate non-obstructive CAD with 50% CFX  Echo (12/2013): EF 30-35%, grade III DD, AV bioprosthetic valve nl, V max: 0.96 cm^2, mod/severe MR, RV sys. fx moderately reduced, TR mild Carotid dopplers (5/16) with 40-60% left carotid stenosis, < 40% right carotid stenosis (CEA side).   Labs: 03/10/2014  K 5.8 Cr 1.68 04/08/2014  K 4.0 Cr 1.60 Na 136 05/06/2014 K 3.3 Creatinine 2.08  05/12/2014 K 4.1 Creatinine 2.1  5/16 LDL 80, HCT 29.3, K 3.9, creatinine 1.59  ROS: All systems negative except as listed in HPI, PMH and Problem List.  SH:  History   Social History  . Marital Status: Married    Spouse Name: N/A  . Number of Children: N/A  . Years of Education: N/A   Occupational History  . Not on file.   Social History Main Topics  . Smoking status: Former Smoker -- 2.00 packs/day for 35 years    Types: Cigarettes    Quit date: 09/26/1998  . Smokeless tobacco: Never Used  . Alcohol Use: 0.6 oz/week    1 Cans of beer per week     Comment: drinks one can of beer or wine daily.  . Drug Use: No  . Sexual Activity: Not on file   Other Topics Concern  . Not on file   Social History Narrative    FH:  Family History  Problem Relation Age of Onset  . Stroke Mother   .  Cancer Father     Past Medical History  Diagnosis Date  . Arthritis   . Non-obstructive CAD     a. 12/2013 NSTEMI/Cath: LM nl, LAD 20, LCX 40-50, RCA 20.  Marland Kitchen Hypertension   . Stroke     a. July 2000;  b. January 2007;  c. TIA in 2014.  . CKD (chronic kidney disease), stage III   . GERD (gastroesophageal reflux disease)   . Foot pain, bilateral   . H/O hematuria   . Hyperlipidemia   . Vitamin D deficiency   . Chronic combined systolic and diastolic CHF (congestive heart failure)     a. 12/2013 Echo: EF 30-35%, mod conc LVH, Gr 3 DD, mild AI, mod-sev MR, mod dil LA, mild TR.  Marland Kitchen PAF (paroxysmal atrial fibrillation)     a. CHA2DS2VASc = 7--> Chronic Coumadin.  Marland Kitchen NICM  (nonischemic cardiomyopathy)     a. 12/2013 Echo: EF 30-35%.  . Aortic valve prosthesis present     a. 2006: S/P bioprosthetic AVR.  Marland Kitchen PAD (peripheral artery disease)     a. s/p LLE stenting.  . Carotid arterial disease     a. s/p R CEA  . History of tobacco abuse   . Moderate to Severe Mitral Regurgitation     a. 12/2013 Echo: Mod-Sev MR.  Marland Kitchen Anxiety   . COPD (chronic obstructive pulmonary disease)   . Coronary arteriosclerosis Oct 2015    med Rx  . Shortness of breath dyspnea   . Depression   . Presence of permanent cardiac pacemaker   . Heart murmur   . Dysrhythmia     Current Outpatient Prescriptions  Medication Sig Dispense Refill  . acetaminophen (TYLENOL) 500 MG tablet Take 1,000 mg by mouth at bedtime.     Marland Kitchen allopurinol (ZYLOPRIM) 100 MG tablet Take 200 mg by mouth daily.    Marland Kitchen atorvastatin (LIPITOR) 40 MG tablet Take 40 mg by mouth every evening.    . Calcium Carb-Cholecalciferol (CALCIUM 600 + D PO) Take 2 tablets by mouth every morning.    . carvedilol (COREG) 6.25 MG tablet Take 0.5 tablets (3.125 mg total) by mouth 2 (two) times daily with a meal. 60 tablet 3  . clopidogrel (PLAVIX) 75 MG tablet Take 75 mg by mouth daily.    . DULoxetine (CYMBALTA) 60 MG capsule Take 60 mg by mouth daily.     . Omega-3 Fatty Acids (FISH OIL) 1200 MG CAPS Take 1 capsule by mouth every morning.    Marland Kitchen omeprazole (PRILOSEC) 40 MG capsule Take 40 mg by mouth every morning.    . potassium chloride SA (K-DUR,KLOR-CON) 20 MEQ tablet Take 1 tablet (20 mEq total) by mouth as needed. TAKE ONLY WHEN YOU TAKE METOLAZONE 30 tablet 3  . predniSONE (DELTASONE) 10 MG tablet Take 1 tablet (10 mg total) by mouth as directed. Take 4 tabs orally for the next 4 days, then take 3 tabs orally for the next 4 days, then take 2 tabs orally for the next 4 days, then take 1 tab orally for the next 4 days. 30 tablet 0  . spironolactone (ALDACTONE) 25 MG tablet Take 0.5 tablets (12.5 mg total) by mouth daily. 15 tablet  3  . tamsulosin (FLOMAX) 0.4 MG CAPS capsule Take 2 capsules (0.8 mg total) by mouth at bedtime. 60 capsule 0  . torsemide (DEMADEX) 20 MG tablet Take 2 tablets (40 mg total) by mouth daily. Take 40 mg in am and 20 mg in pm 60  tablet 0  . warfarin (COUMADIN) 4 MG tablet Take 2 mg by mouth daily at 6 PM.      No current facility-administered medications for this encounter.    Filed Vitals:   08/28/14 1544  BP: 131/31  Pulse: 91  Resp: 20  Weight: 154 lb 4 oz (69.967 kg)  SpO2: 95%   PHYSICAL EXAM: General:  Elderly appearing. No resp difficulty Wife present  HEENT: normal Neck: supple. JVP ~10  cm. Carotids 2+ bilaterally; no bruits. No lymphadenopathy or thryomegaly appreciated. Cor: PMI normal. Regular rate & rhythm. No rubs, gallops. 2/6 HSM LLSB/apex Lungs: clear  Abdomen: soft, nontender, nondistended. No hepatosplenomegaly. No bruits or masses. Good bowel sounds. Extremities: no cyanosis, clubbing, rash.  Rand LLE 1+ ankle edema bilaterally Neuro: alert & orientedx3, cranial nerves grossly intact. Moves all 4 extremities w/o difficulty. Affect pleasant  ASSESSMENT & PLAN: 1) Chronic combined systolic/diastolic HF: Nonischemic cardiomyopathy s/p Medtronic CRT-P.  Echo with EF 30-35%, grade III DD, mod/severe MR, RV sys fx mod reduced (12/2013). NYHA II-III symptoms.   Mild volume overload noted. Increase torsemide to 40 mg twice a day  -Repeat BMET/BNP in 2 wks. - Continue  spironolactone 12.5 daily  - Last visit he was to increase carvedilol to 6.25 mg twice a day but he continued on coreg 3.125 mg twice a day. Will not increase with fatigue.  - Will continue to hold hydralazine/Imdur.   2) PAF:  CHADVASC score = 5. NSR by exam. Off amiodarone with possible lung toxicity.  On coumadin and plavix daily (had TIAs on coumadin alone).   3) CKD stage III: Stable. Check BMET   4) Bioprosthetic AVR: Stable on recent echo. Needs endocarditis prophylaxis with dental work.  5)  Suspected OSA: Has PFTs June 23. Has follow up with Dr Vassie Loll. Day time fatigue.  Need to arrange sleep study.  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  Continue prednisone taper.  Not using oxygen during the day.  Has pulmonary follow up.  7. Difficulty urinating - Had urinary retention in the hospital. On flomax. Check UA. Refer urology.  8) TIA- 07/2014 Continue statin and plavix.   Follow up in 2 wks. Check CBC, BMET, BNP, UA     CLEGG,AMY NP-C  08/28/2014

## 2014-08-29 ENCOUNTER — Telehealth (HOSPITAL_COMMUNITY): Payer: Self-pay

## 2014-08-29 ENCOUNTER — Other Ambulatory Visit (HOSPITAL_COMMUNITY): Payer: Self-pay

## 2014-08-29 MED ORDER — CIPROFLOXACIN HCL 250 MG PO TABS: 250.0000 mg | ORAL_TABLET | Freq: Two times a day (BID) | ORAL | Status: DC

## 2014-08-29 NOTE — Telephone Encounter (Signed)
Reviewed positive UA with patient's wife, per Amy Clegg NP-C Rx for Cipro 250mg  twice daily X 5 days sent into preferred pharmacy.  Advised to call us back next week after antibiotics are completed if s/s don't subside.  Ave Filter

## 2014-08-31 LAB — URINE CULTURE

## 2014-09-02 ENCOUNTER — Telehealth (HOSPITAL_COMMUNITY): Payer: Self-pay | Admitting: Cardiology

## 2014-09-02 NOTE — Telephone Encounter (Signed)
pts wife called with questions regarding med changes states Torsemide was increased and weight has been decreasing, UOP increasing B/p today 110/77  weight has steadily decreased ~ 1 pound daily since last office visit Patient denies increased SOB, dizziness, light headiness, confusion  Advised that at last office visit pt was evaluated and thought to have some fluid on board, hence the increase in Torsemide Pt currently seems to be doing well and above symptoms are to be expected Pt should continue meds as prescribed and call if he becomes symptomatic  at all (increased dizziness, light headiness, "dry" , severe decrease in b/p, etc)  pts wife aware and voiced understanding  Amy- FYI

## 2014-09-05 ENCOUNTER — Telehealth (HOSPITAL_COMMUNITY): Payer: Self-pay | Admitting: *Deleted

## 2014-09-05 NOTE — Telephone Encounter (Signed)
pts wife called stated pts bp is 105/62 she said that is pretty low for him she wants to know if this is an indication of him being "dry"?  His torsemide was recently increased to 40 am 40 pm wants to know if maybe he should decrease his medication at all?   Yesterday Amy advised Chantel : "at last office visit pt was evaluated and thought to have some fluid on board, hence the increase in Torsemide Pt currently seems to be doing well and above symptoms are to be expected Pt should continue meds as prescribed and call if he becomes symptomatic at all (increased dizziness, light headiness, "dry" , severe decrease in b/p, etc)"  Left vm for pts wife to call me back

## 2014-09-05 NOTE — Telephone Encounter (Signed)
Spoke with pts wife and advised. She stated she understood and would call back if he became symptomatic.

## 2014-09-12 ENCOUNTER — Encounter (HOSPITAL_COMMUNITY): Payer: Self-pay

## 2014-09-12 ENCOUNTER — Ambulatory Visit (HOSPITAL_COMMUNITY)
Admission: RE | Admit: 2014-09-12 | Discharge: 2014-09-12 | Disposition: A | Discharge status: Home / Self Care | Payer: Medicare HMO | Source: Ambulatory Visit | Attending: Cardiology | Admitting: Cardiology

## 2014-09-12 VITALS — BP 108/60 | HR 74 | Wt 154.8 lb

## 2014-09-12 DIAGNOSIS — K219 Gastro-esophageal reflux disease without esophagitis: Secondary | ICD-10-CM | POA: Insufficient documentation

## 2014-09-12 DIAGNOSIS — R918 Other nonspecific abnormal finding of lung field: Secondary | ICD-10-CM | POA: Insufficient documentation

## 2014-09-12 DIAGNOSIS — I429 Cardiomyopathy, unspecified: Secondary | ICD-10-CM | POA: Insufficient documentation

## 2014-09-12 DIAGNOSIS — I739 Peripheral vascular disease, unspecified: Secondary | ICD-10-CM | POA: Diagnosis not present

## 2014-09-12 DIAGNOSIS — I2510 Atherosclerotic heart disease of native coronary artery without angina pectoris: Secondary | ICD-10-CM | POA: Insufficient documentation

## 2014-09-12 DIAGNOSIS — Z87891 Personal history of nicotine dependence: Secondary | ICD-10-CM | POA: Diagnosis not present

## 2014-09-12 DIAGNOSIS — I428 Other cardiomyopathies: Secondary | ICD-10-CM

## 2014-09-12 DIAGNOSIS — E785 Hyperlipidemia, unspecified: Secondary | ICD-10-CM | POA: Insufficient documentation

## 2014-09-12 DIAGNOSIS — I129 Hypertensive chronic kidney disease with stage 1 through stage 4 chronic kidney disease, or unspecified chronic kidney disease: Secondary | ICD-10-CM | POA: Insufficient documentation

## 2014-09-12 DIAGNOSIS — Z79899 Other long term (current) drug therapy: Secondary | ICD-10-CM | POA: Diagnosis not present

## 2014-09-12 DIAGNOSIS — I480 Paroxysmal atrial fibrillation: Secondary | ICD-10-CM | POA: Insufficient documentation

## 2014-09-12 DIAGNOSIS — Z7901 Long term (current) use of anticoagulants: Secondary | ICD-10-CM | POA: Insufficient documentation

## 2014-09-12 DIAGNOSIS — Z952 Presence of prosthetic heart valve: Secondary | ICD-10-CM

## 2014-09-12 DIAGNOSIS — N183 Chronic kidney disease, stage 3 unspecified: Secondary | ICD-10-CM

## 2014-09-12 DIAGNOSIS — E559 Vitamin D deficiency, unspecified: Secondary | ICD-10-CM | POA: Insufficient documentation

## 2014-09-12 DIAGNOSIS — I5042 Chronic combined systolic (congestive) and diastolic (congestive) heart failure: Secondary | ICD-10-CM | POA: Diagnosis present

## 2014-09-12 DIAGNOSIS — Z7902 Long term (current) use of antithrombotics/antiplatelets: Secondary | ICD-10-CM | POA: Insufficient documentation

## 2014-09-12 DIAGNOSIS — Z953 Presence of xenogenic heart valve: Secondary | ICD-10-CM | POA: Diagnosis not present

## 2014-09-12 DIAGNOSIS — Z8673 Personal history of transient ischemic attack (TIA), and cerebral infarction without residual deficits: Secondary | ICD-10-CM | POA: Diagnosis not present

## 2014-09-12 DIAGNOSIS — F419 Anxiety disorder, unspecified: Secondary | ICD-10-CM | POA: Insufficient documentation

## 2014-09-12 DIAGNOSIS — F329 Major depressive disorder, single episode, unspecified: Secondary | ICD-10-CM | POA: Diagnosis not present

## 2014-09-12 DIAGNOSIS — G458 Other transient cerebral ischemic attacks and related syndromes: Secondary | ICD-10-CM | POA: Diagnosis not present

## 2014-09-12 DIAGNOSIS — Z950 Presence of cardiac pacemaker: Secondary | ICD-10-CM | POA: Insufficient documentation

## 2014-09-12 DIAGNOSIS — I5022 Chronic systolic (congestive) heart failure: Secondary | ICD-10-CM

## 2014-09-12 DIAGNOSIS — Z954 Presence of other heart-valve replacement: Secondary | ICD-10-CM | POA: Diagnosis not present

## 2014-09-12 DIAGNOSIS — J449 Chronic obstructive pulmonary disease, unspecified: Secondary | ICD-10-CM | POA: Diagnosis not present

## 2014-09-12 MED ORDER — CARVEDILOL 6.25 MG PO TABS: 6.2500 mg | ORAL_TABLET | Freq: Two times a day (BID) | ORAL | Status: AC

## 2014-09-12 NOTE — Patient Instructions (Signed)
Increase Carvedilol to 6.25 mg Twice daily   You have been referred to Cardiac Rehab, they will contact you to schedule  Your physician recommends that you schedule a follow-up appointment in: 1 month

## 2014-09-14 NOTE — Progress Notes (Signed)
Patient ID: Timothy Durham, male   DOB: Nov 06, 1930, 81 y.o.   MRN: [id number] PCP: Dr Luiz Iron Primary Cardiologist: Dr. Ladona Ridgel  HPI: [name] is an 81 yo male with a history of prior CVA in 2000 secondary to AF, nonischemic cardiomoyopathy s/p Medtronic CRT-P (02/17/14), chronic systolic HF, non-obstructive CAD, HTN, CKD stage III, HLD, PAD and bioprosthetic AVR.   Admitted to [facility] 2/7 through 05/06/14 with volume overload. Diuresed with IV lasix and later transitioned to 40 mg torsemide in am and 20 mg in pm. Also started on home oxygen at night. Discharge weight was 143 pounds.    He was admitted in 5/16 with dyspnea, fever, chills and found to have PNA on CXR and multifocal airspace disease on CT.  He require oxygen.  There was concern for aspiration PNA versus amiodarone lung toxicity (organizing PNA related to amiodarone).  Amiodarone was stopped and he was put on prednisone, now off.  His breathing and oxygen saturation gradually improved.  While in the hospital, he had TIAs.  He had been off Plavix for possible bronchoscopy, which ended up not being done.  He had carotid dopplers and was evaluated by vascular who thought that the TIAs were not likely to be related to carotid stenosis (likely related to atrial fibrillation history). He is now back on Plavix and warfarin.  Hydralazine/Imdur and spironolactone were stopped to promote higher BP in the setting of TIAs.   At last visit, he had UTI which was treated.  Stable today.  Short of breath walking about 1/2 block.  No chest pain.  Occasional lightheadedness usually in the mornings.  Doing home PT, this is to end soon.  No TIA-like symptoms.    Studies: RHC (01/14/2014): RA 12, RV 42/11 (16), PA 42/23 (25), PCWP 17, PA 61%, Fick CO/CI: 4.53/2.27 LHC (01/14/1014): moderate non-obstructive CAD with 50% CFX  Echo (12/2013): EF 30-35%, grade III DD, AV bioprosthetic valve nl, V max: 0.96 cm^2, mod/severe MR, RV sys. fx moderately reduced, TR  mild Carotid dopplers (5/16) with 40-60% left carotid stenosis, < 40% right carotid stenosis (CEA side).   Labs: 03/10/2014  K 5.8 Cr 1.68 04/08/2014  K 4.0 Cr 1.60 Na 136 05/06/2014 K 3.3 Creatinine 2.08  05/12/2014 K 4.1 Creatinine 2.1  5/16 LDL 80, HCT 29.3, K 3.9, creatinine 1.59 6/16 BNP 1481, K 4.4, creatinine 1.71, HCT 31.3  ROS: All systems negative except as listed in HPI, PMH and Problem List.  SH:  History   Social History  . Marital Status: Married    Spouse Name: N/A  . Number of Children: N/A  . Years of Education: N/A   Occupational History  . Not on file.   Social History Main Topics  . Smoking status: Former Smoker -- 2.00 packs/day for 35 years    Types: Cigarettes    Quit date: 09/26/1998  . Smokeless tobacco: Never Used  . Alcohol Use: 0.6 oz/week    1 Cans of beer per week     Comment: drinks one can of beer or wine daily.  . Drug Use: No  . Sexual Activity: Not on file   Other Topics Concern  . Not on file   Social History Narrative    FH:  Family History  Problem Relation Age of Onset  . Stroke Mother   . Cancer Father     Past Medical History  Diagnosis Date  . Arthritis   . Non-obstructive CAD     a. 12/2013 NSTEMI/Cath: LM nl,  LAD 20, LCX 40-50, RCA 20.  Marland Kitchen Hypertension   . Stroke     a. July 2000;  b. January 2007;  c. TIA in 2014.  . CKD (chronic kidney disease), stage III   . GERD (gastroesophageal reflux disease)   . Foot pain, bilateral   . H/O hematuria   . Hyperlipidemia   . Vitamin D deficiency   . Chronic combined systolic and diastolic CHF (congestive heart failure)     a. 12/2013 Echo: EF 30-35%, mod conc LVH, Gr 3 DD, mild AI, mod-sev MR, mod dil LA, mild TR.  Marland Kitchen PAF (paroxysmal atrial fibrillation)     a. CHA2DS2VASc = 7--> Chronic Coumadin.  Marland Kitchen NICM (nonischemic cardiomyopathy)     a. 12/2013 Echo: EF 30-35%.  . Aortic valve prosthesis present     a. 2006: S/P bioprosthetic AVR.  Marland Kitchen PAD (peripheral artery disease)      a. s/p LLE stenting.  . Carotid arterial disease     a. s/p R CEA  . History of tobacco abuse   . Moderate to Severe Mitral Regurgitation     a. 12/2013 Echo: Mod-Sev MR.  Marland Kitchen Anxiety   . COPD (chronic obstructive pulmonary disease)   . Coronary arteriosclerosis Oct 2015    med Rx  . Shortness of breath dyspnea   . Depression   . Presence of permanent cardiac pacemaker   . Heart murmur   . Dysrhythmia     Current Outpatient Prescriptions  Medication Sig Dispense Refill  . acetaminophen (TYLENOL) 500 MG tablet Take 1,000 mg by mouth at bedtime.     Marland Kitchen allopurinol (ZYLOPRIM) 100 MG tablet Take 200 mg by mouth daily.    Marland Kitchen atorvastatin (LIPITOR) 40 MG tablet Take 40 mg by mouth every evening.    . Calcium Carb-Cholecalciferol (CALCIUM 600 + D PO) Take 2 tablets by mouth every morning.    . carvedilol (COREG) 6.25 MG tablet Take 1 tablet (6.25 mg total) by mouth 2 (two) times daily with a meal. 60 tablet 3  . clopidogrel (PLAVIX) 75 MG tablet Take 75 mg by mouth daily.    . DULoxetine (CYMBALTA) 60 MG capsule Take 60 mg by mouth daily.     . Omega-3 Fatty Acids (FISH OIL) 1200 MG CAPS Take 1 capsule by mouth every morning.    Marland Kitchen omeprazole (PRILOSEC) 40 MG capsule Take 40 mg by mouth every morning.    . potassium chloride SA (K-DUR,KLOR-CON) 20 MEQ tablet Take 1 tablet (20 mEq total) by mouth as needed. TAKE ONLY WHEN YOU TAKE METOLAZONE 30 tablet 3  . spironolactone (ALDACTONE) 25 MG tablet Take 0.5 tablets (12.5 mg total) by mouth daily. 15 tablet 3  . tamsulosin (FLOMAX) 0.4 MG CAPS capsule Take 2 capsules (0.8 mg total) by mouth at bedtime. 60 capsule 0  . torsemide (DEMADEX) 20 MG tablet Take 40 mg by mouth 2 (two) times daily.    Marland Kitchen warfarin (COUMADIN) 4 MG tablet Take 2 mg by mouth daily at 6 PM.      No current facility-administered medications for this encounter.    Filed Vitals:   09/12/14 1136  BP: 108/60  Pulse: 74  Weight: 154 lb 12 oz (70.194 kg)  SpO2: 97%    PHYSICAL EXAM: General:  Elderly appearing. No resp difficulty Wife present  HEENT: normal Neck: supple. No JVD. Carotids 2+ bilaterally; no bruits. No lymphadenopathy or thryomegaly appreciated. Cor: PMI normal. Regular rate & rhythm. No rubs, gallops. 2/6 HSM LLSB/apex  Lungs: clear  Abdomen: soft, nontender, nondistended. No hepatosplenomegaly. No bruits or masses. Good bowel sounds. Extremities: no cyanosis, clubbing, rash.  No edema.  Neuro: alert & orientedx3, cranial nerves grossly intact. Moves all 4 extremities w/o difficulty. Affect pleasant  ASSESSMENT & PLAN: 1) Chronic combined systolic/diastolic HF: Nonischemic cardiomyopathy s/p Medtronic CRT-P.  Echo with EF 30-35%, grade III DD, mod/severe MR, RV sys fx mod reduced (12/2013). NYHA II-III symptoms.  He does not appear significantly volume overloaded today. - Increase Coreg to 6.25 mg bid.  - Continue  spironolactone 12.5 daily  - No ACEI for now with CKD.  - When home PT has been completed, I will arrange for cardiac rehab.   2) PAF:  CHADVASC score = 5. NSR by exam today. Off amiodarone with possible lung toxicity.  On coumadin and plavix daily (had TIAs on coumadin alone).   3) CKD stage III: Stable.    4) Bioprosthetic AVR: Stable on recent echo. Needs endocarditis prophylaxis with dental work.  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  Completed prednisone.  Not using oxygen during the day.  To see Dr. Vassie Loll soon.  6) TIA: 07/2014 Continue statin and plavix.   Marca Ancona  09/14/2014

## 2014-09-15 ENCOUNTER — Telehealth: Payer: Self-pay | Admitting: Cardiology

## 2014-09-15 NOTE — Telephone Encounter (Signed)
[name] is calling because the Physical Therapist  is wanting to do a stimulation on the right foot to retrain a right foot drop. Wants to know if  That will interfere with his pacemaker , Please call . The therapist is at the home right now  And will be waiting for the call back .    Thanks

## 2014-09-15 NOTE — Telephone Encounter (Signed)
I talked with allison whom is the Physical therapist and she wanted permission  To do Electrical stimulation for ten minutes two times a week, I consulted with Levora Angel over at [address] and was informed that it would be ok to proceed with the treatment and there was no risk to the pt., allison the physical therapist was informed

## 2014-09-18 ENCOUNTER — Ambulatory Visit (INDEPENDENT_AMBULATORY_CARE_PROVIDER_SITE_OTHER): Payer: Medicare HMO | Admitting: Pulmonary Disease

## 2014-09-18 DIAGNOSIS — J441 Chronic obstructive pulmonary disease with (acute) exacerbation: Secondary | ICD-10-CM

## 2014-09-18 DIAGNOSIS — J189 Pneumonia, unspecified organism: Secondary | ICD-10-CM

## 2014-09-18 LAB — PULMONARY FUNCTION TEST
DL/VA % PRED: 64 %
DL/VA: 2.96 ml/min/mmHg/L
DLCO UNC: 14.59 ml/min/mmHg
DLCO unc % pred: 44 %
FEF 25-75 POST: 0.98 L/s
FEF 25-75 PRE: 0.95 L/s
FEF2575-%Change-Post: 2 %
FEF2575-%PRED-PRE: 49 %
FEF2575-%Pred-Post: 50 %
FEV1-%CHANGE-POST: 1 %
FEV1-%PRED-POST: 67 %
FEV1-%Pred-Pre: 66 %
FEV1-PRE: 1.91 L
FEV1-Post: 1.93 L
FEV1FVC-%Change-Post: 3 %
FEV1FVC-%Pred-Pre: 90 %
FEV6-%CHANGE-POST: -2 %
FEV6-%PRED-POST: 76 %
FEV6-%Pred-Pre: 77 %
FEV6-PRE: 2.92 L
FEV6-Post: 2.86 L
FEV6FVC-%Change-Post: 0 %
FEV6FVC-%PRED-POST: 106 %
FEV6FVC-%Pred-Pre: 105 %
FVC-%Change-Post: -2 %
FVC-%Pred-Post: 71 %
FVC-%Pred-Pre: 73 %
FVC-POST: 2.9 L
FVC-PRE: 2.96 L
PRE FEV1/FVC RATIO: 64 %
Post FEV1/FVC ratio: 67 %
Post FEV6/FVC ratio: 99 %
Pre FEV6/FVC Ratio: 99 %
RV % pred: 91 %
RV: 2.48 L
TLC % pred: 74 %
TLC: 5.33 L

## 2014-09-18 NOTE — Progress Notes (Signed)
PFT done today. 

## 2014-09-25 ENCOUNTER — Ambulatory Visit (INDEPENDENT_AMBULATORY_CARE_PROVIDER_SITE_OTHER): Payer: Medicare HMO | Admitting: Pulmonary Disease

## 2014-09-25 ENCOUNTER — Encounter: Payer: Self-pay | Admitting: Pulmonary Disease

## 2014-09-25 VITALS — BP 117/52 | HR 77 | Ht 70.0 in | Wt 157.0 lb

## 2014-09-25 DIAGNOSIS — J441 Chronic obstructive pulmonary disease with (acute) exacerbation: Secondary | ICD-10-CM | POA: Diagnosis not present

## 2014-09-25 DIAGNOSIS — T462X5A Adverse effect of other antidysrhythmic drugs, initial encounter: Secondary | ICD-10-CM

## 2014-09-25 DIAGNOSIS — G4733 Obstructive sleep apnea (adult) (pediatric): Secondary | ICD-10-CM

## 2014-09-25 DIAGNOSIS — J708 Respiratory conditions due to other specified external agents: Secondary | ICD-10-CM

## 2014-09-25 DIAGNOSIS — J984 Other disorders of lung: Secondary | ICD-10-CM

## 2014-09-25 DIAGNOSIS — R0902 Hypoxemia: Secondary | ICD-10-CM

## 2014-09-25 NOTE — Assessment & Plan Note (Signed)
Daytime hypoxia is improved-although he still desaturates, does not need daytime oxygen Continue oxygen during sleep We will clarify the need for this after sleep study

## 2014-09-25 NOTE — Assessment & Plan Note (Signed)
Doubt need for long-acting bronchodilators at this time. We'll consider if his breathing gets worse

## 2014-09-25 NOTE — Progress Notes (Signed)
Subjective:    Patient ID: Timothy Durham, male    DOB: 01-22-1931, 81 y.o.   MRN: [id number]  HPI  81 yo male former smoker seen for pulmonary consult during hospitalization 07/25/14 for pulmonary infiltrates.  Has nonischemic CM with EF of 30-35%.  Heavy smoker 2-3PPD until 2000 , > 40 Pyrs  09/25/2014  Chief Complaint  Patient presents with  . Follow-up    PFT done 09/18/2011; wants to know if he can stop oxygen, only wears oxygen at night. Patient was going to have sleep study done to check for sleep apnea, but went to the hospital before he could get study done.    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 -This started after about 5 months of amiodarone and within 72 hours of exposure to some new pinewood furniture he assembled. The illness was associated with a low grade temperature, bilateral infiltrates, scant clear sputum production, and dyspnea with new hypoxemia. He is much improved, prednisone has been tapered to off. He can walk around the house and in the store. He desaturates to 90% on walking today He was started on oxygen in 03/2014, now only uses that during sleep  Epworth sleepiness score is 11 Bedtime is around 11 PM, sleep latency is minimal, reports several nocturnal awakenings, wife notes that he stops breathing in his sleep, snoring has decreased lately, reports nocturia and is out of bed at 6 AM  SIGNIFICANT EVENTS / STUDIES:  CT chest 4/29: diffuse patchy GG opacifications bilaterally, small bilateral pleural effusions.  Barium swallow 5/3>>> No esophageal stricture or mass, nonspecific esophageal dysmotility, Stasis in the supine position. PFTs X/2016-FEV1 66%, no broncho-dilator response, ratio 71, TLC 74%, DLCO 44%  Past Medical History  Diagnosis Date  . Arthritis   . Non-obstructive CAD     a. 12/2013 NSTEMI/Cath: LM nl,  LAD 20, LCX 40-50, RCA 20.  Marland Kitchen Hypertension   . Stroke     a. July 2000;  b. January 2007;  c. TIA in 2014.  . CKD (chronic kidney disease), stage III   . GERD (gastroesophageal reflux disease)   . Foot pain, bilateral   . H/O hematuria   . Hyperlipidemia   . Vitamin D deficiency   . Chronic combined systolic and diastolic CHF (congestive heart failure)     a. 12/2013 Echo: EF 30-35%, mod conc LVH, Gr 3 DD, mild AI, mod-sev MR, mod dil LA, mild TR.  Marland Kitchen PAF (paroxysmal atrial fibrillation)     a. CHA2DS2VASc = 7--> Chronic Coumadin.  Marland Kitchen NICM (nonischemic cardiomyopathy)     a. 12/2013 Echo: EF 30-35%.  . Aortic valve prosthesis present     a. 2006: S/P bioprosthetic AVR.  Marland Kitchen PAD (peripheral artery disease)     a. s/p LLE stenting.  . Carotid arterial disease     a. s/p R CEA  . History of tobacco abuse   . Moderate to Severe Mitral Regurgitation     a. 12/2013 Echo: Mod-Sev MR.  Marland Kitchen Anxiety   . COPD (chronic obstructive pulmonary disease)   . Coronary arteriosclerosis Oct 2015    med Rx  . Shortness of breath dyspnea   . Depression   . Presence of permanent cardiac pacemaker   . Heart murmur   . Dysrhythmia      Review of Systems neg for any significant sore throat, dysphagia, itching, sneezing, nasal congestion or excess/  purulent secretions, fever, chills, sweats, unintended wt loss, pleuritic or exertional cp, hempoptysis, orthopnea pnd or change in chronic leg swelling. Also denies presyncope, palpitations, heartburn, abdominal pain, nausea, vomiting, diarrhea or change in bowel or urinary habits, dysuria,hematuria, rash, arthralgias, visual complaints, headache, numbness weakness or ataxia.     Objective:   Physical Exam  Gen. Pleasant, well-nourished, in no distress ENT - no lesions, no post nasal drip Neck: No JVD, no thyromegaly, no carotid bruits Lungs: no use of accessory muscles, no dullness to percussion, clear without rales or rhonchi  Cardiovascular: Rhythm  regular, heart sounds  normal, no murmurs or gallops, no peripheral edema Musculoskeletal: No deformities, no cyanosis or clubbing        Assessment & Plan:

## 2014-09-25 NOTE — Patient Instructions (Addendum)
Sleep study will be scheduled Breathing test shows lung capacity at 67% - from smoking -'COPD' Good luck with cardiac rehab

## 2014-09-25 NOTE — Assessment & Plan Note (Signed)
Due to concerns of witnessed apneas by wife, nocturnal hypoxia, and congestive heart failure-we'll proceed with an lab polysomnogram. The pathophysiology of obstructive sleep apnea , it's cardiovascular consequences & modes of treatment including CPAP were discused with the patient in detail & they evidenced understanding.

## 2014-09-25 NOTE — Assessment & Plan Note (Signed)
Infiltrates have resolved except for some scarring in right upper lobe Repeat chest x-ray in 3 months

## 2014-09-30 ENCOUNTER — Other Ambulatory Visit (HOSPITAL_COMMUNITY): Payer: Self-pay | Admitting: Cardiology

## 2014-09-30 MED ORDER — TORSEMIDE 20 MG PO TABS: 40.0000 mg | ORAL_TABLET | Freq: Two times a day (BID) | ORAL | Status: DC

## 2014-10-03 ENCOUNTER — Other Ambulatory Visit (HOSPITAL_COMMUNITY): Payer: Self-pay | Admitting: *Deleted

## 2014-10-03 MED ORDER — TORSEMIDE 20 MG PO TABS: 40.0000 mg | ORAL_TABLET | Freq: Two times a day (BID) | ORAL | Status: DC

## 2014-10-03 NOTE — Telephone Encounter (Signed)
Received call from [facility], they state they are filling pt's Torsemide however pt will run out before they can get it to him and ask that we send in a temp supply to local [facility] for pt, rx sent for 2 week supply

## 2014-10-06 ENCOUNTER — Encounter (HOSPITAL_BASED_OUTPATIENT_CLINIC_OR_DEPARTMENT_OTHER): Payer: Self-pay | Admitting: *Deleted

## 2014-10-06 ENCOUNTER — Emergency Department (HOSPITAL_BASED_OUTPATIENT_CLINIC_OR_DEPARTMENT_OTHER): Payer: Medicare HMO

## 2014-10-06 ENCOUNTER — Emergency Department (HOSPITAL_BASED_OUTPATIENT_CLINIC_OR_DEPARTMENT_OTHER)
Admission: EM | Admit: 2014-10-06 | Discharge: 2014-10-06 | Disposition: A | Discharge status: Home / Self Care | Payer: Medicare HMO | Source: Home / Self Care | Attending: Emergency Medicine | Admitting: Emergency Medicine

## 2014-10-06 ENCOUNTER — Telehealth (HOSPITAL_COMMUNITY): Payer: Self-pay | Admitting: *Deleted

## 2014-10-06 DIAGNOSIS — Z880 Allergy status to penicillin: Secondary | ICD-10-CM

## 2014-10-06 DIAGNOSIS — I5043 Acute on chronic combined systolic (congestive) and diastolic (congestive) heart failure: Secondary | ICD-10-CM | POA: Diagnosis not present

## 2014-10-06 DIAGNOSIS — Z87891 Personal history of nicotine dependence: Secondary | ICD-10-CM | POA: Insufficient documentation

## 2014-10-06 DIAGNOSIS — Z79899 Other long term (current) drug therapy: Secondary | ICD-10-CM | POA: Insufficient documentation

## 2014-10-06 DIAGNOSIS — E785 Hyperlipidemia, unspecified: Secondary | ICD-10-CM

## 2014-10-06 DIAGNOSIS — I2510 Atherosclerotic heart disease of native coronary artery without angina pectoris: Secondary | ICD-10-CM | POA: Insufficient documentation

## 2014-10-06 DIAGNOSIS — R0602 Shortness of breath: Secondary | ICD-10-CM | POA: Diagnosis not present

## 2014-10-06 DIAGNOSIS — I480 Paroxysmal atrial fibrillation: Secondary | ICD-10-CM

## 2014-10-06 DIAGNOSIS — I129 Hypertensive chronic kidney disease with stage 1 through stage 4 chronic kidney disease, or unspecified chronic kidney disease: Secondary | ICD-10-CM

## 2014-10-06 DIAGNOSIS — Z7902 Long term (current) use of antithrombotics/antiplatelets: Secondary | ICD-10-CM

## 2014-10-06 DIAGNOSIS — J441 Chronic obstructive pulmonary disease with (acute) exacerbation: Secondary | ICD-10-CM

## 2014-10-06 DIAGNOSIS — K219 Gastro-esophageal reflux disease without esophagitis: Secondary | ICD-10-CM

## 2014-10-06 DIAGNOSIS — Z7901 Long term (current) use of anticoagulants: Secondary | ICD-10-CM | POA: Insufficient documentation

## 2014-10-06 DIAGNOSIS — R011 Cardiac murmur, unspecified: Secondary | ICD-10-CM | POA: Insufficient documentation

## 2014-10-06 DIAGNOSIS — Z950 Presence of cardiac pacemaker: Secondary | ICD-10-CM | POA: Insufficient documentation

## 2014-10-06 DIAGNOSIS — Z8673 Personal history of transient ischemic attack (TIA), and cerebral infarction without residual deficits: Secondary | ICD-10-CM | POA: Insufficient documentation

## 2014-10-06 DIAGNOSIS — F419 Anxiety disorder, unspecified: Secondary | ICD-10-CM | POA: Insufficient documentation

## 2014-10-06 DIAGNOSIS — Z8739 Personal history of other diseases of the musculoskeletal system and connective tissue: Secondary | ICD-10-CM | POA: Insufficient documentation

## 2014-10-06 DIAGNOSIS — N183 Chronic kidney disease, stage 3 (moderate): Secondary | ICD-10-CM

## 2014-10-06 DIAGNOSIS — I5042 Chronic combined systolic (congestive) and diastolic (congestive) heart failure: Secondary | ICD-10-CM

## 2014-10-06 DIAGNOSIS — E559 Vitamin D deficiency, unspecified: Secondary | ICD-10-CM | POA: Insufficient documentation

## 2014-10-06 DIAGNOSIS — F329 Major depressive disorder, single episode, unspecified: Secondary | ICD-10-CM

## 2014-10-06 LAB — CBC WITH DIFFERENTIAL/PLATELET
BASOS ABS: 0 10*3/uL (ref 0.0–0.1)
BASOS PCT: 0 % (ref 0–1)
Eosinophils Absolute: 0.3 10*3/uL (ref 0.0–0.7)
Eosinophils Relative: 3 % (ref 0–5)
HEMATOCRIT: 31.8 % — AB (ref 39.0–52.0)
Hemoglobin: 10.2 g/dL — ABNORMAL LOW (ref 13.0–17.0)
LYMPHS ABS: 0.7 10*3/uL (ref 0.7–4.0)
Lymphocytes Relative: 8 % — ABNORMAL LOW (ref 12–46)
MCH: 30.3 pg (ref 26.0–34.0)
MCHC: 32.1 g/dL (ref 30.0–36.0)
MCV: 94.4 fL (ref 78.0–100.0)
MONOS PCT: 8 % (ref 3–12)
Monocytes Absolute: 0.6 10*3/uL (ref 0.1–1.0)
NEUTROS PCT: 81 % — AB (ref 43–77)
Neutro Abs: 6.7 10*3/uL (ref 1.7–7.7)
PLATELETS: 247 10*3/uL (ref 150–400)
RBC: 3.37 MIL/uL — ABNORMAL LOW (ref 4.22–5.81)
RDW: 16 % — AB (ref 11.5–15.5)
WBC: 8.3 10*3/uL (ref 4.0–10.5)

## 2014-10-06 LAB — PROTIME-INR
INR: 2.48 — ABNORMAL HIGH (ref 0.00–1.49)
Prothrombin Time: 26.5 seconds — ABNORMAL HIGH (ref 11.6–15.2)

## 2014-10-06 LAB — BASIC METABOLIC PANEL
Anion gap: 11 (ref 5–15)
BUN: 39 mg/dL — ABNORMAL HIGH (ref 6–20)
CALCIUM: 8.7 mg/dL — AB (ref 8.9–10.3)
CHLORIDE: 95 mmol/L — AB (ref 101–111)
CO2: 29 mmol/L (ref 22–32)
CREATININE: 1.99 mg/dL — AB (ref 0.61–1.24)
GFR calc non Af Amer: 30 mL/min — ABNORMAL LOW (ref >60)
GFR, EST AFRICAN AMERICAN: 35 mL/min — AB (ref >60)
GLUCOSE: 107 mg/dL — AB (ref 65–99)
POTASSIUM: 3.8 mmol/L (ref 3.5–5.1)
Sodium: 135 mmol/L (ref 135–145)

## 2014-10-06 LAB — BRAIN NATRIURETIC PEPTIDE: B NATRIURETIC PEPTIDE 5: 971.4 pg/mL — AB (ref 0.0–100.0)

## 2014-10-06 MED ORDER — IPRATROPIUM-ALBUTEROL 0.5-2.5 (3) MG/3ML IN SOLN
3.0000 mL | Freq: Four times a day (QID) | RESPIRATORY_TRACT | Status: DC
  Administered 2014-10-06: 3 mL via RESPIRATORY_TRACT

## 2014-10-06 MED ORDER — PREDNISONE 50 MG PO TABS: ORAL_TABLET | ORAL | Status: DC

## 2014-10-06 MED ORDER — PREDNISONE 50 MG PO TABS
60.0000 mg | ORAL_TABLET | Freq: Once | ORAL | Status: AC
  Administered 2014-10-06: 60 mg via ORAL

## 2014-10-06 MED ORDER — IPRATROPIUM-ALBUTEROL 0.5-2.5 (3) MG/3ML IN SOLN
RESPIRATORY_TRACT | Status: AC
  Filled 2014-10-06: qty 3

## 2014-10-06 MED ORDER — PREDNISONE 20 MG PO TABS
ORAL_TABLET | ORAL | Status: AC
  Filled 2014-10-06: qty 3

## 2014-10-06 NOTE — ED Notes (Signed)
No changes. Neb in progress. VSS. Family at [facility].

## 2014-10-06 NOTE — ED Provider Notes (Signed)
CSN: [id number]     Arrival date & time 10/06/14  0057 History   First MD Initiated Contact with Patient 10/06/14 0118     Chief Complaint  Patient presents with  . Shortness of Breath    Patient is a 81 y.o. male presenting with shortness of breath. The history is provided by the patient and the spouse.  Shortness of Breath Severity:  Moderate Onset quality:  Gradual Duration:  5 hours Timing:  Intermittent Progression:  Worsening Chronicity:  Recurrent Relieved by:  Rest Worsened by:  Activity Associated symptoms: cough   Associated symptoms: no chest pain, no fever and no syncope   PT presents with SOB for past 5 hours It appears worse with exertion He has mild cough no hemoptysis He has no CP He has h/o COPD/CHF He has pacemaker in place Pt is on 2LNC at night only Past Medical History  Diagnosis Date  . Arthritis   . Non-obstructive CAD     a. 12/2013 NSTEMI/Cath: LM nl, LAD 20, LCX 40-50, RCA 20.  Marland Kitchen Hypertension   . Stroke     a. July 2000;  b. January 2007;  c. TIA in 2014.  . CKD (chronic kidney disease), stage III   . GERD (gastroesophageal reflux disease)   . Foot pain, bilateral   . H/O hematuria   . Hyperlipidemia   . Vitamin D deficiency   . Chronic combined systolic and diastolic CHF (congestive heart failure)     a. 12/2013 Echo: EF 30-35%, mod conc LVH, Gr 3 DD, mild AI, mod-sev MR, mod dil LA, mild TR.  Marland Kitchen PAF (paroxysmal atrial fibrillation)     a. CHA2DS2VASc = 7--> Chronic Coumadin.  Marland Kitchen NICM (nonischemic cardiomyopathy)     a. 12/2013 Echo: EF 30-35%.  . Aortic valve prosthesis present     a. 2006: S/P bioprosthetic AVR.  Marland Kitchen PAD (peripheral artery disease)     a. s/p LLE stenting.  . Carotid arterial disease     a. s/p R CEA  . History of tobacco abuse   . Moderate to Severe Mitral Regurgitation     a. 12/2013 Echo: Mod-Sev MR.  Marland Kitchen Anxiety   . COPD (chronic obstructive pulmonary disease)   . Coronary arteriosclerosis Oct 2015    med Rx  .  Shortness of breath dyspnea   . Depression   . Presence of permanent cardiac pacemaker   . Heart murmur   . Dysrhythmia    Past Surgical History  Procedure Laterality Date  . Aortic valve replacement (avr)/coronary artery bypass grafting (cabg)  12/2008  . Eye surgery    . Carotid angiogram  1996  . Left lower extremity stent    . Carotid endarterectomy    . Insert / replace / remove pacemaker    . Coronary angiogram  Oct 2015  . Pacemaker insertion  Nov 2015    MDT BiV  . Left and right heart catheterization with coronary angiogram N/A 01/14/2014    Procedure: LEFT AND RIGHT HEART CATHETERIZATION WITH CORONARY ANGIOGRAM;  Surgeon: Peter M Swaziland, MD;  Location: [facility] CATH LAB;  Service: Cardiovascular;  Laterality: N/A;  . Bi-ventricular pacemaker insertion N/A 02/17/2014    Procedure: BI-VENTRICULAR PACEMAKER INSERTION (CRT-P);  Surgeon: Marinus Maw, MD;  Location: [facility] CATH LAB;  Service: Cardiovascular;  Laterality: N/A;  . Coronary artery bypass graft    . Coronary angioplasty    . Tonsillectomy     Family History  Problem Relation Age of Onset  .  Stroke Mother   . Cancer Father    History  Substance Use Topics  . Smoking status: Former Smoker -- 2.00 packs/day for 35 years    Types: Cigarettes    Quit date: 09/26/1998  . Smokeless tobacco: Never Used  . Alcohol Use: 0.6 oz/week    1 Cans of beer per week     Comment: drinks one can of beer or wine daily.    Review of Systems  Constitutional: Negative for fever.  Respiratory: Positive for cough and shortness of breath.   Cardiovascular: Negative for chest pain and syncope.  All other systems reviewed and are negative.     Allergies  Ambien; Ativan; Oxycontin; Amiodarone; Penicillins; and Sulfa antibiotics  Home Medications   Prior to Admission medications   Medication Sig Start Date End Date Taking? Authorizing Provider  acetaminophen (TYLENOL) 500 MG tablet Take 1,000 mg by mouth at bedtime.     Historical  Provider, MD  allopurinol (ZYLOPRIM) 100 MG tablet Take 200 mg by mouth daily.    Historical Provider, MD  atorvastatin (LIPITOR) 40 MG tablet Take 40 mg by mouth every evening.    Historical Provider, MD  Calcium Carb-Cholecalciferol (CALCIUM 600 + D PO) Take 2 tablets by mouth every morning.    Historical Provider, MD  carvedilol (COREG) 6.25 MG tablet Take 1 tablet (6.25 mg total) by mouth 2 (two) times daily with a meal. 09/12/14   Laurey Morale, MD  clopidogrel (PLAVIX) 75 MG tablet Take 75 mg by mouth daily.    Historical Provider, MD  DULoxetine (CYMBALTA) 60 MG capsule Take 60 mg by mouth daily.  03/18/14   Historical Provider, MD  Omega-3 Fatty Acids (FISH OIL) 1200 MG CAPS Take 1 capsule by mouth every morning.    Historical Provider, MD  omeprazole (PRILOSEC) 40 MG capsule Take 40 mg by mouth every morning.    Historical Provider, MD  potassium chloride SA (K-DUR,KLOR-CON) 20 MEQ tablet Take 1 tablet (20 mEq total) by mouth as needed. TAKE ONLY WHEN YOU TAKE METOLAZONE 04/10/14   Dolores Patty, MD  spironolactone (ALDACTONE) 25 MG tablet Take 0.5 tablets (12.5 mg total) by mouth daily. 08/22/14   Laurey Morale, MD  tamsulosin (FLOMAX) 0.4 MG CAPS capsule Take 2 capsules (0.8 mg total) by mouth at bedtime. 08/07/14   Penny Pia, MD  torsemide (DEMADEX) 20 MG tablet Take 2 tablets (40 mg total) by mouth 2 (two) times daily. 10/03/14   Dolores Patty, MD  warfarin (COUMADIN) 4 MG tablet Take 2 mg by mouth daily at 6 PM.  02/24/13   Historical Provider, MD   BP 118/46 mmHg  Pulse 67  Temp(Src) 97.3 F (36.3 C) (Oral)  Resp 28  SpO2 98% Physical Exam CONSTITUTIONAL: elderly, frail HEAD: Normocephalic/atraumatic EYES: EOMI ENMT: Mucous membranes moist NECK: supple no meningeal signs SPINE/BACK:entire spine nontender CV: S1/S2 noted LUNGS: decreased BS noted bilaterally, crackles noted bilaterally, mild tachypnea noted ABDOMEN: soft, nontender, no rebound or guarding, bowel  sounds noted throughout abdomen GU:no cva tenderness NEURO: Pt is awake/alert/appropriate, moves all extremitiesx4.  No facial droop.   EXTREMITIES: pulses normal/equal, full ROM, no LE edema SKIN: warm, color normal PSYCH: no abnormalities of mood noted, alert and oriented to situation  ED Course  Procedures  Medications  ipratropium-albuterol (DUONEB) 0.5-2.5 (3) MG/3ML nebulizer solution 3 mL (3 mLs Nebulization Given 10/06/14 0232)  predniSONE (DELTASONE) 20 MG tablet (not administered)  predniSONE (DELTASONE) tablet 60 mg (60 mg Oral Given  10/06/14 0236)     3:53 AM Initial concern for decompensated CHF BNP improved.  CXR does not reveal acute CHF Suspect this may be his COPD He was given nebs/steroids and he was improved His air entry was improved He ambulated and he had no dyspnea.  He desatted to 90-93% but this has been seen previously (see recent pulmonology clinic note) so I Suspect he is at baseline I feel he is safe for d/c home I doubt PE/ACS at this time He feels comfortable for d/c home Will start steroids and advised to use his albuterol MDI at home We discussed strict ER return precautions Advised to continue his oxygen at night Labs Review Labs Reviewed  BASIC METABOLIC PANEL - Abnormal; Notable for the following:    Chloride 95 (*)    Glucose, Bld 107 (*)    BUN 39 (*)    Creatinine, Ser 1.99 (*)    Calcium 8.7 (*)    GFR calc non Af Amer 30 (*)    GFR calc Af Amer 35 (*)    All other components within normal limits  CBC WITH DIFFERENTIAL/PLATELET - Abnormal; Notable for the following:    RBC 3.37 (*)    Hemoglobin 10.2 (*)    HCT 31.8 (*)    RDW 16.0 (*)    Neutrophils Relative % 81 (*)    Lymphocytes Relative 8 (*)    All other components within normal limits  BRAIN NATRIURETIC PEPTIDE  PROTIME-INR    Imaging Review Dg Chest Portable 1 View  10/06/2014   CLINICAL DATA:  81 year old male with shortness of breath  EXAM: PORTABLE CHEST - 1 VIEW   COMPARISON:  Chest radiograph dated 08/18/2014  FINDINGS: Single-view of the chest demonstrates emphysematous changes of the lungs. Linear scarring noted in the right mid lung field. There are bilateral lower lung field interstitial prominence which may be atelectatic changes or scarring. Pneumonia is not excluded. Small bilateral pleural effusions. Stable cardiac silhouette. Median sternotomy wires and left pectoral pacemaker device. The osseous structures appear unremarkable. Probable calcified right hilar granuloma.  IMPRESSION: Small bilateral pleural effusions with bilateral lower lung field atelectasis /scarring versus pneumonia. Clinical correlation and follow-up recommended.   Electronically Signed   By: Elgie Collard M.D.   On: 10/06/2014 01:52     EKG Interpretation   Date/Time:  Monday October 06 2014 01:19:04 EDT Ventricular Rate:  70 PR Interval:  196 QRS Duration: 120 QT Interval:  454 QTC Calculation: 490 R Axis:   -9 Text Interpretation:  Atrial-sensed ventricular-paced rhythm Abnormal ECG  No significant change since last tracing Confirmed by Bebe Shaggy  MD, Dorinda Hill  [phone]) on 10/06/2014 1:19:33 AM      MDM   Final diagnoses:  Chronic obstructive pulmonary disease with acute exacerbation    xrays/imaging reviewed by myself and considered during evaluation Labs/vital reviewed myself and considered during evaluation Nursing notes including past medical history and social history reviewed and considered in documentation Previous records reviewed and considered     Zadie Rhine, MD 10/06/14 [phone]

## 2014-10-06 NOTE — ED Notes (Addendum)
Pt presents to the ED Sob. Hx of COPD and CHF. States he started feeling sob at 8pm last night. Denies any CP. Last admit for CHF was in April 2016. Pt presents with 02 sats 87% on RA. Wears home oxygen 2L at hs. 02 placed at 2l via n/c. Has had a dry cough. Denies any fevers.

## 2014-10-06 NOTE — Telephone Encounter (Signed)
Pts wife called and stated pt was taken to the ER last night.   He was having trouble breathing they gave him prednisone and albuterol. She was told to call the HF clinic to see if he should take daily albuterol and to advise on any preventive measures. Please advise

## 2014-10-06 NOTE — Telephone Encounter (Signed)
OK to take albuterol for couple of days until he feels better then stop regular use.

## 2014-10-06 NOTE — ED Notes (Signed)
Xray and Dr. Bebe Shaggy at [facility]

## 2014-10-07 ENCOUNTER — Emergency Department (HOSPITAL_COMMUNITY): Payer: Medicare HMO

## 2014-10-07 ENCOUNTER — Telehealth: Payer: Self-pay | Admitting: Pulmonary Disease

## 2014-10-07 ENCOUNTER — Telehealth (HOSPITAL_COMMUNITY): Payer: Self-pay | Admitting: *Deleted

## 2014-10-07 ENCOUNTER — Inpatient Hospital Stay (HOSPITAL_COMMUNITY)
Admission: EM | Admit: 2014-10-07 | Discharge: 2014-10-09 | DRG: 292 | Disposition: A | Discharge status: Home / Self Care | Payer: Medicare HMO | Attending: Internal Medicine | Admitting: Internal Medicine

## 2014-10-07 ENCOUNTER — Ambulatory Visit: Payer: Medicare HMO | Admitting: Adult Health

## 2014-10-07 ENCOUNTER — Encounter (HOSPITAL_COMMUNITY): Payer: Self-pay | Admitting: *Deleted

## 2014-10-07 DIAGNOSIS — Z7901 Long term (current) use of anticoagulants: Secondary | ICD-10-CM

## 2014-10-07 DIAGNOSIS — Z882 Allergy status to sulfonamides status: Secondary | ICD-10-CM | POA: Diagnosis not present

## 2014-10-07 DIAGNOSIS — Z8673 Personal history of transient ischemic attack (TIA), and cerebral infarction without residual deficits: Secondary | ICD-10-CM

## 2014-10-07 DIAGNOSIS — Z953 Presence of xenogenic heart valve: Secondary | ICD-10-CM | POA: Diagnosis not present

## 2014-10-07 DIAGNOSIS — Z66 Do not resuscitate: Secondary | ICD-10-CM | POA: Diagnosis present

## 2014-10-07 DIAGNOSIS — I739 Peripheral vascular disease, unspecified: Secondary | ICD-10-CM | POA: Diagnosis present

## 2014-10-07 DIAGNOSIS — I10 Essential (primary) hypertension: Secondary | ICD-10-CM | POA: Diagnosis present

## 2014-10-07 DIAGNOSIS — Z87891 Personal history of nicotine dependence: Secondary | ICD-10-CM | POA: Diagnosis not present

## 2014-10-07 DIAGNOSIS — N179 Acute kidney failure, unspecified: Secondary | ICD-10-CM | POA: Diagnosis not present

## 2014-10-07 DIAGNOSIS — Z951 Presence of aortocoronary bypass graft: Secondary | ICD-10-CM | POA: Diagnosis not present

## 2014-10-07 DIAGNOSIS — I2510 Atherosclerotic heart disease of native coronary artery without angina pectoris: Secondary | ICD-10-CM | POA: Diagnosis present

## 2014-10-07 DIAGNOSIS — R0602 Shortness of breath: Secondary | ICD-10-CM | POA: Diagnosis present

## 2014-10-07 DIAGNOSIS — I429 Cardiomyopathy, unspecified: Secondary | ICD-10-CM | POA: Diagnosis present

## 2014-10-07 DIAGNOSIS — F329 Major depressive disorder, single episode, unspecified: Secondary | ICD-10-CM | POA: Diagnosis present

## 2014-10-07 DIAGNOSIS — Z952 Presence of prosthetic heart valve: Secondary | ICD-10-CM

## 2014-10-07 DIAGNOSIS — I5043 Acute on chronic combined systolic (congestive) and diastolic (congestive) heart failure: Secondary | ICD-10-CM | POA: Diagnosis present

## 2014-10-07 DIAGNOSIS — E785 Hyperlipidemia, unspecified: Secondary | ICD-10-CM | POA: Diagnosis present

## 2014-10-07 DIAGNOSIS — J449 Chronic obstructive pulmonary disease, unspecified: Secondary | ICD-10-CM

## 2014-10-07 DIAGNOSIS — I480 Paroxysmal atrial fibrillation: Secondary | ICD-10-CM | POA: Diagnosis present

## 2014-10-07 DIAGNOSIS — J438 Other emphysema: Secondary | ICD-10-CM | POA: Diagnosis not present

## 2014-10-07 DIAGNOSIS — I129 Hypertensive chronic kidney disease with stage 1 through stage 4 chronic kidney disease, or unspecified chronic kidney disease: Secondary | ICD-10-CM | POA: Diagnosis present

## 2014-10-07 DIAGNOSIS — Z888 Allergy status to other drugs, medicaments and biological substances status: Secondary | ICD-10-CM

## 2014-10-07 DIAGNOSIS — J441 Chronic obstructive pulmonary disease with (acute) exacerbation: Secondary | ICD-10-CM | POA: Diagnosis present

## 2014-10-07 DIAGNOSIS — Z7902 Long term (current) use of antithrombotics/antiplatelets: Secondary | ICD-10-CM

## 2014-10-07 DIAGNOSIS — Z880 Allergy status to penicillin: Secondary | ICD-10-CM | POA: Diagnosis not present

## 2014-10-07 DIAGNOSIS — Z954 Presence of other heart-valve replacement: Secondary | ICD-10-CM | POA: Diagnosis not present

## 2014-10-07 DIAGNOSIS — M1990 Unspecified osteoarthritis, unspecified site: Secondary | ICD-10-CM | POA: Diagnosis present

## 2014-10-07 DIAGNOSIS — T462X5A Adverse effect of other antidysrhythmic drugs, initial encounter: Secondary | ICD-10-CM

## 2014-10-07 DIAGNOSIS — N183 Chronic kidney disease, stage 3 unspecified: Secondary | ICD-10-CM | POA: Diagnosis present

## 2014-10-07 DIAGNOSIS — J984 Other disorders of lung: Secondary | ICD-10-CM | POA: Diagnosis present

## 2014-10-07 DIAGNOSIS — K219 Gastro-esophageal reflux disease without esophagitis: Secondary | ICD-10-CM | POA: Diagnosis present

## 2014-10-07 DIAGNOSIS — Z950 Presence of cardiac pacemaker: Secondary | ICD-10-CM | POA: Diagnosis not present

## 2014-10-07 DIAGNOSIS — F419 Anxiety disorder, unspecified: Secondary | ICD-10-CM | POA: Diagnosis present

## 2014-10-07 DIAGNOSIS — Z885 Allergy status to narcotic agent status: Secondary | ICD-10-CM

## 2014-10-07 DIAGNOSIS — R0600 Dyspnea, unspecified: Secondary | ICD-10-CM

## 2014-10-07 DIAGNOSIS — I509 Heart failure, unspecified: Secondary | ICD-10-CM

## 2014-10-07 LAB — COMPREHENSIVE METABOLIC PANEL
ALT: 12 U/L — ABNORMAL LOW (ref 17–63)
AST: 19 U/L (ref 15–41)
Albumin: 3.9 g/dL (ref 3.5–5.0)
Alkaline Phosphatase: 69 U/L (ref 38–126)
Anion gap: 12 (ref 5–15)
BUN: 33 mg/dL — AB (ref 6–20)
CHLORIDE: 96 mmol/L — AB (ref 101–111)
CO2: 26 mmol/L (ref 22–32)
CREATININE: 1.91 mg/dL — AB (ref 0.61–1.24)
Calcium: 8.9 mg/dL (ref 8.9–10.3)
GFR calc Af Amer: 36 mL/min — ABNORMAL LOW (ref >60)
GFR calc non Af Amer: 31 mL/min — ABNORMAL LOW (ref >60)
Glucose, Bld: 163 mg/dL — ABNORMAL HIGH (ref 65–99)
Potassium: 4.5 mmol/L (ref 3.5–5.1)
SODIUM: 134 mmol/L — AB (ref 135–145)
TOTAL PROTEIN: 6.6 g/dL (ref 6.5–8.1)
Total Bilirubin: 0.7 mg/dL (ref 0.3–1.2)

## 2014-10-07 LAB — I-STAT ARTERIAL BLOOD GAS, ED
Acid-Base Excess: 4 mmol/L — ABNORMAL HIGH (ref 0.0–2.0)
Bicarbonate: 29.6 mEq/L — ABNORMAL HIGH (ref 20.0–24.0)
O2 Saturation: 97 %
PCO2 ART: 47.5 mmHg — AB (ref 35.0–45.0)
PH ART: 7.404 (ref 7.350–7.450)
PO2 ART: 92 mmHg (ref 80.0–100.0)
Patient temperature: 98.6
TCO2: 31 mmol/L (ref 0–100)

## 2014-10-07 LAB — CBC WITH DIFFERENTIAL/PLATELET
BASOS ABS: 0 10*3/uL (ref 0.0–0.1)
Basophils Relative: 0 % (ref 0–1)
EOS ABS: 0.1 10*3/uL (ref 0.0–0.7)
Eosinophils Relative: 1 % (ref 0–5)
HCT: 35 % — ABNORMAL LOW (ref 39.0–52.0)
HEMOGLOBIN: 11.3 g/dL — AB (ref 13.0–17.0)
LYMPHS ABS: 0.4 10*3/uL — AB (ref 0.7–4.0)
Lymphocytes Relative: 3 % — ABNORMAL LOW (ref 12–46)
MCH: 29.9 pg (ref 26.0–34.0)
MCHC: 32.3 g/dL (ref 30.0–36.0)
MCV: 92.6 fL (ref 78.0–100.0)
MONO ABS: 0.2 10*3/uL (ref 0.1–1.0)
MONOS PCT: 1 % — AB (ref 3–12)
NEUTROS ABS: 14.8 10*3/uL — AB (ref 1.7–7.7)
Neutrophils Relative %: 95 % — ABNORMAL HIGH (ref 43–77)
Platelets: 294 10*3/uL (ref 150–400)
RBC: 3.78 MIL/uL — AB (ref 4.22–5.81)
RDW: 16.6 % — AB (ref 11.5–15.5)
WBC: 15.6 10*3/uL — AB (ref 4.0–10.5)

## 2014-10-07 LAB — BRAIN NATRIURETIC PEPTIDE: B NATRIURETIC PEPTIDE 5: 1758.5 pg/mL — AB (ref 0.0–100.0)

## 2014-10-07 LAB — PROTIME-INR
INR: 2.71 — ABNORMAL HIGH (ref 0.00–1.49)
PROTHROMBIN TIME: 28.3 s — AB (ref 11.6–15.2)

## 2014-10-07 LAB — URINALYSIS, ROUTINE W REFLEX MICROSCOPIC
Bilirubin Urine: NEGATIVE
GLUCOSE, UA: NEGATIVE mg/dL
Hgb urine dipstick: NEGATIVE
Ketones, ur: NEGATIVE mg/dL
Leukocytes, UA: NEGATIVE
Nitrite: NEGATIVE
PH: 6.5 (ref 5.0–8.0)
Protein, ur: NEGATIVE mg/dL
SPECIFIC GRAVITY, URINE: 1.007 (ref 1.005–1.030)
UROBILINOGEN UA: 0.2 mg/dL (ref 0.0–1.0)

## 2014-10-07 LAB — MRSA PCR SCREENING: MRSA BY PCR: NEGATIVE

## 2014-10-07 LAB — TROPONIN I: TROPONIN I: 0.03 ng/mL (ref <0.031)

## 2014-10-07 MED ORDER — FUROSEMIDE 10 MG/ML IJ SOLN
40.0000 mg | Freq: Two times a day (BID) | INTRAMUSCULAR | Status: DC
  Administered 2014-10-07 – 2014-10-08 (×2): 40 mg via INTRAVENOUS
  Filled 2014-10-07 (×3): qty 4

## 2014-10-07 MED ORDER — FUROSEMIDE 10 MG/ML IJ SOLN
40.0000 mg | Freq: Once | INTRAMUSCULAR | Status: AC
  Administered 2014-10-07: 40 mg via INTRAVENOUS
  Filled 2014-10-07: qty 4

## 2014-10-07 MED ORDER — WARFARIN SODIUM 2 MG PO TABS
2.0000 mg | ORAL_TABLET | Freq: Once | ORAL | Status: AC
  Administered 2014-10-07: 2 mg via ORAL
  Filled 2014-10-07: qty 1

## 2014-10-07 MED ORDER — TAMSULOSIN HCL 0.4 MG PO CAPS
0.8000 mg | ORAL_CAPSULE | Freq: Every day | ORAL | Status: DC
  Administered 2014-10-07 – 2014-10-08 (×2): 0.8 mg via ORAL
  Filled 2014-10-07 (×3): qty 2

## 2014-10-07 MED ORDER — CETYLPYRIDINIUM CHLORIDE 0.05 % MT LIQD
7.0000 mL | Freq: Two times a day (BID) | OROMUCOSAL | Status: DC
  Administered 2014-10-07 – 2014-10-09 (×4): 7 mL via OROMUCOSAL

## 2014-10-07 MED ORDER — DULOXETINE HCL 60 MG PO CPEP
60.0000 mg | ORAL_CAPSULE | Freq: Every day | ORAL | Status: DC
  Administered 2014-10-08 – 2014-10-09 (×2): 60 mg via ORAL
  Filled 2014-10-07 (×2): qty 1

## 2014-10-07 MED ORDER — CARVEDILOL 6.25 MG PO TABS
6.2500 mg | ORAL_TABLET | Freq: Two times a day (BID) | ORAL | Status: DC
  Administered 2014-10-07 – 2014-10-09 (×4): 6.25 mg via ORAL
  Filled 2014-10-07 (×6): qty 1

## 2014-10-07 MED ORDER — SODIUM CHLORIDE 0.9 % IJ SOLN
3.0000 mL | Freq: Two times a day (BID) | INTRAMUSCULAR | Status: DC
  Administered 2014-10-07 – 2014-10-09 (×4): 3 mL via INTRAVENOUS

## 2014-10-07 MED ORDER — CHLORHEXIDINE GLUCONATE 0.12 % MT SOLN
15.0000 mL | Freq: Two times a day (BID) | OROMUCOSAL | Status: DC
  Administered 2014-10-07 – 2014-10-09 (×3): 15 mL via OROMUCOSAL
  Filled 2014-10-07 (×6): qty 15

## 2014-10-07 MED ORDER — ACETAMINOPHEN 650 MG RE SUPP: 650.0000 mg | Freq: Four times a day (QID) | RECTAL | Status: DC | PRN

## 2014-10-07 MED ORDER — ACETAMINOPHEN 325 MG PO TABS: 650.0000 mg | ORAL_TABLET | Freq: Four times a day (QID) | ORAL | Status: DC | PRN

## 2014-10-07 MED ORDER — ALBUTEROL SULFATE (2.5 MG/3ML) 0.083% IN NEBU: 2.5000 mg | INHALATION_SOLUTION | RESPIRATORY_TRACT | Status: DC | PRN

## 2014-10-07 MED ORDER — ALLOPURINOL 100 MG PO TABS
200.0000 mg | ORAL_TABLET | Freq: Every day | ORAL | Status: DC
  Administered 2014-10-08 – 2014-10-09 (×2): 200 mg via ORAL
  Filled 2014-10-07 (×2): qty 2

## 2014-10-07 MED ORDER — CLOPIDOGREL BISULFATE 75 MG PO TABS
75.0000 mg | ORAL_TABLET | Freq: Every day | ORAL | Status: DC
  Administered 2014-10-08 – 2014-10-09 (×2): 75 mg via ORAL
  Filled 2014-10-07 (×2): qty 1

## 2014-10-07 MED ORDER — SPIRONOLACTONE 12.5 MG HALF TABLET
12.5000 mg | ORAL_TABLET | Freq: Every day | ORAL | Status: DC
  Administered 2014-10-08 – 2014-10-09 (×2): 12.5 mg via ORAL
  Filled 2014-10-07 (×2): qty 1

## 2014-10-07 MED ORDER — WARFARIN - PHARMACIST DOSING INPATIENT
Freq: Every day | Status: DC
  Administered 2014-10-08: 18:00:00

## 2014-10-07 MED ORDER — ATORVASTATIN CALCIUM 40 MG PO TABS
40.0000 mg | ORAL_TABLET | Freq: Every evening | ORAL | Status: DC
  Administered 2014-10-07 – 2014-10-08 (×2): 40 mg via ORAL
  Filled 2014-10-07 (×3): qty 1

## 2014-10-07 NOTE — ED Provider Notes (Signed)
CSN: [id number]     Arrival date & time 10/07/14  1252 History   First MD Initiated Contact with Patient 10/07/14 1257     Chief Complaint  Patient presents with  . Shortness of Breath     (Consider location/radiation/quality/duration/timing/severity/associated sxs/prior Treatment) HPI The patient reports shortness of breath starting yesterday. No associated chest pain or fever. The patient was seen at [facility] yesterday and at that time symptoms were considered most likely COPD. And less likely to be decompensated CHF. The patient got much worse very quickly this afternoon. He'll did not develop chest pain but became much more short of breath. Medics report the patient was pale and diaphoretic upon their arrival. They have placed him on BiPAP and gotten good results. The patient states he feels much better since being on the BiPAP. He was not given Solu-Medrol or DuoNeb in the field. Past Medical History  Diagnosis Date  . Arthritis   . Non-obstructive CAD     a. 12/2013 NSTEMI/Cath: LM nl, LAD 20, LCX 40-50, RCA 20.  Marland Kitchen Hypertension   . Stroke     a. July 2000;  b. January 2007;  c. TIA in 2014.  . CKD (chronic kidney disease), stage III   . GERD (gastroesophageal reflux disease)   . Foot pain, bilateral   . H/O hematuria   . Hyperlipidemia   . Vitamin D deficiency   . Chronic combined systolic and diastolic CHF (congestive heart failure)     a. 12/2013 Echo: EF 30-35%, mod conc LVH, Gr 3 DD, mild AI, mod-sev MR, mod dil LA, mild TR.  Marland Kitchen PAF (paroxysmal atrial fibrillation)     a. CHA2DS2VASc = 7--> Chronic Coumadin.  Marland Kitchen NICM (nonischemic cardiomyopathy)     a. 12/2013 Echo: EF 30-35%.  . Aortic valve prosthesis present     a. 2006: S/P bioprosthetic AVR.  Marland Kitchen PAD (peripheral artery disease)     a. s/p LLE stenting.  . Carotid arterial disease     a. s/p R CEA  . History of tobacco abuse   . Moderate to Severe Mitral Regurgitation     a. 12/2013 Echo: Mod-Sev MR.  Marland Kitchen  Anxiety   . COPD (chronic obstructive pulmonary disease)   . Coronary arteriosclerosis Oct 2015    med Rx  . Shortness of breath dyspnea   . Depression   . Presence of permanent cardiac pacemaker   . Heart murmur   . Dysrhythmia    Past Surgical History  Procedure Laterality Date  . Aortic valve replacement (avr)/coronary artery bypass grafting (cabg)  12/2008  . Eye surgery    . Carotid angiogram  1996  . Left lower extremity stent    . Carotid endarterectomy    . Insert / replace / remove pacemaker    . Coronary angiogram  Oct 2015  . Pacemaker insertion  Nov 2015    MDT BiV  . Left and right heart catheterization with coronary angiogram N/A 01/14/2014    Procedure: LEFT AND RIGHT HEART CATHETERIZATION WITH CORONARY ANGIOGRAM;  Surgeon: Peter M Swaziland, MD;  Location: [facility] CATH LAB;  Service: Cardiovascular;  Laterality: N/A;  . Bi-ventricular pacemaker insertion N/A 02/17/2014    Procedure: BI-VENTRICULAR PACEMAKER INSERTION (CRT-P);  Surgeon: Marinus Maw, MD;  Location: [facility] CATH LAB;  Service: Cardiovascular;  Laterality: N/A;  . Coronary artery bypass graft    . Coronary angioplasty    . Tonsillectomy     Family History  Problem Relation  Age of Onset  . Stroke Mother   . Cancer Father    History  Substance Use Topics  . Smoking status: Former Smoker -- 2.00 packs/day for 35 years    Types: Cigarettes    Quit date: 09/26/1998  . Smokeless tobacco: Never Used  . Alcohol Use: 0.6 oz/week    1 Cans of beer per week     Comment: drinks one can of beer or wine daily.    Review of Systems  10 Systems reviewed and are negative for acute change except as noted in the HPI.   Allergies  Ambien; Ativan; Oxycontin; Amiodarone; Penicillins; and Sulfa antibiotics  Home Medications   Prior to Admission medications   Medication Sig Start Date End Date Taking? Authorizing Provider  acetaminophen (TYLENOL) 500 MG tablet Take 1,000 mg by mouth at bedtime.     Historical  Provider, MD  allopurinol (ZYLOPRIM) 100 MG tablet Take 200 mg by mouth daily.    Historical Provider, MD  atorvastatin (LIPITOR) 40 MG tablet Take 40 mg by mouth every evening.    Historical Provider, MD  Calcium Carb-Cholecalciferol (CALCIUM 600 + D PO) Take 2 tablets by mouth every morning.    Historical Provider, MD  carvedilol (COREG) 6.25 MG tablet Take 1 tablet (6.25 mg total) by mouth 2 (two) times daily with a meal. 09/12/14   Laurey Morale, MD  clopidogrel (PLAVIX) 75 MG tablet Take 75 mg by mouth daily.    Historical Provider, MD  DULoxetine (CYMBALTA) 60 MG capsule Take 60 mg by mouth daily.  03/18/14   Historical Provider, MD  Omega-3 Fatty Acids (FISH OIL) 1200 MG CAPS Take 1 capsule by mouth every morning.    Historical Provider, MD  omeprazole (PRILOSEC) 40 MG capsule Take 40 mg by mouth every morning.    Historical Provider, MD  potassium chloride SA (K-DUR,KLOR-CON) 20 MEQ tablet Take 1 tablet (20 mEq total) by mouth as needed. TAKE ONLY WHEN YOU TAKE METOLAZONE 04/10/14   Dolores Patty, MD  predniSONE (DELTASONE) 50 MG tablet One tablet PO daily for 4 days 10/06/14   Zadie Rhine, MD  spironolactone (ALDACTONE) 25 MG tablet Take 0.5 tablets (12.5 mg total) by mouth daily. 08/22/14   Laurey Morale, MD  tamsulosin (FLOMAX) 0.4 MG CAPS capsule Take 2 capsules (0.8 mg total) by mouth at bedtime. 08/07/14   Penny Pia, MD  torsemide (DEMADEX) 20 MG tablet Take 2 tablets (40 mg total) by mouth 2 (two) times daily. 10/03/14   Dolores Patty, MD  warfarin (COUMADIN) 4 MG tablet Take 2 mg by mouth daily at 6 PM.  02/24/13   Historical Provider, MD   BP 131/56 mmHg  Pulse 87  Temp(Src) 98.2 F (36.8 C) (Axillary)  Resp 15  SpO2 93% Physical Exam  Constitutional: He is oriented to person, place, and time.  Patient is frail appearing, color is mildly pale. He is wearing a BiPAP and alert. Moderate respiratory distress.  HENT:  Head: Normocephalic and atraumatic.  Eyes:  EOM are normal. Pupils are equal, round, and reactive to light.  Neck: Neck supple.  Cardiovascular: Intact distal pulses.   Tachycardia, distant heart sounds with significant respiratory noise.  Pulmonary/Chest:  Crackles in bilateral lung fields. Moderate increased work of breathing. The patient is on BiPAP at time of examination.  Abdominal: Soft. Bowel sounds are normal. He exhibits no distension.  Musculoskeletal: Normal range of motion. He exhibits no edema or tenderness.  Neurological: He is alert  and oriented to person, place, and time. No cranial nerve deficit.  Skin: Skin is warm and dry. There is pallor.  Psychiatric: He has a normal mood and affect.    ED Course  Procedures (including critical care time) Labs Review Labs Reviewed  COMPREHENSIVE METABOLIC PANEL - Abnormal; Notable for the following:    Sodium 134 (*)    Chloride 96 (*)    Glucose, Bld 163 (*)    BUN 33 (*)    Creatinine, Ser 1.91 (*)    ALT 12 (*)    GFR calc non Af Amer 31 (*)    GFR calc Af Amer 36 (*)    All other components within normal limits  BRAIN NATRIURETIC PEPTIDE - Abnormal; Notable for the following:    B Natriuretic Peptide 1758.5 (*)    All other components within normal limits  CBC WITH DIFFERENTIAL/PLATELET - Abnormal; Notable for the following:    WBC 15.6 (*)    RBC 3.78 (*)    Hemoglobin 11.3 (*)    HCT 35.0 (*)    RDW 16.6 (*)    Neutrophils Relative % 95 (*)    Neutro Abs 14.8 (*)    Lymphocytes Relative 3 (*)    Lymphs Abs 0.4 (*)    Monocytes Relative 1 (*)    All other components within normal limits  PROTIME-INR - Abnormal; Notable for the following:    Prothrombin Time 28.3 (*)    INR 2.71 (*)    All other components within normal limits  I-STAT ARTERIAL BLOOD GAS, ED - Abnormal; Notable for the following:    pCO2 arterial 47.5 (*)    Bicarbonate 29.6 (*)    Acid-Base Excess 4.0 (*)    All other components within normal limits  TROPONIN I  URINALYSIS, ROUTINE W  REFLEX MICROSCOPIC (NOT AT [facility])  BLOOD GAS, ARTERIAL    Imaging Review Dg Chest Port 1 View  10/07/2014   CLINICAL DATA:  Shortness of breath.  EXAM: PORTABLE CHEST - 1 VIEW  COMPARISON:  October 06, 2014.  FINDINGS: Stable cardiomediastinal silhouette. Left-sided pacemaker is unchanged in position. Status post coronary artery bypass graft. No pneumothorax is noted. Stable small bilateral pleural effusions are noted. There appears to be increased left parahilar and right basilar interstitial opacities which may simply be due to radiographic technique, but increased pulmonary edema superimpose chronic scarring cannot be excluded. Rounded opacity seen in the region the right minor fissure most consistent with loculated fluid or pseudotumor. Bony thorax is intact.  IMPRESSION: Increased bilateral interstitial lung opacities are noted which may simply be due to radiographic technique, but increased pulmonary edema superimposed upon underlying chronic scarring cannot be excluded. Stable small bilateral pleural effusions are noted, with right greater than left. Stable small loculated effusion seen in right minor fissure.   Electronically Signed   By: Lupita Raider, M.D.   On: 10/07/2014 13:51   Dg Chest Portable 1 View  10/06/2014   CLINICAL DATA:  81 year old male with shortness of breath  EXAM: PORTABLE CHEST - 1 VIEW  COMPARISON:  Chest radiograph dated 08/18/2014  FINDINGS: Single-view of the chest demonstrates emphysematous changes of the lungs. Linear scarring noted in the right mid lung field. There are bilateral lower lung field interstitial prominence which may be atelectatic changes or scarring. Pneumonia is not excluded. Small bilateral pleural effusions. Stable cardiac silhouette. Median sternotomy wires and left pectoral pacemaker device. The osseous structures appear unremarkable. Probable calcified right hilar granuloma.  IMPRESSION: Small bilateral pleural effusions with bilateral lower lung field  atelectasis /scarring versus pneumonia. Clinical correlation and follow-up recommended.   Electronically Signed   By: Elgie Collard M.D.   On: 10/06/2014 01:52     EKG Interpretation   Date/Time:  Tuesday October 07 2014 12:55:43 EDT Ventricular Rate:  102 PR Interval:  164 QRS Duration: 151 QT Interval:  401 QTC Calculation: 522 R Axis:   -92 Text Interpretation:  Sinus tachycardia Probable left atrial enlargement  Nonspecific IVCD with LAD Probable inferior infarct, acute Anterior  infarct, old Lateral leads are also involved AGREE. OLD lbbb. ANTERIOR T  WAVE CHANGES Confirmed by Donnald Garre, MD, Lebron Conners [phone]) on 10/07/2014 1:04:35  PM     CRITICAL CARE Performed by: Arby Barrette   Total critical care time: 30  Critical care time was exclusive of separately billable procedures and treating other patients.  Critical care was necessary to treat or prevent imminent or life-threatening deterioration.  Critical care was time spent personally by me on the following activities: development of treatment plan with patient and/or surrogate as well as nursing, discussions with consultants, evaluation of patient's response to treatment, examination of patient, obtaining history from patient or surrogate, ordering and performing treatments and interventions, ordering and review of laboratory studies, ordering and review of radiographic studies, pulse oximetry and re-evaluation of patient's condition. MDM   Final diagnoses:  Acute on chronic congestive heart failure, unspecified congestive heart failure type   Patient was rapidly increasing shortness of breath this morning. Chest x-ray shows bilateral infiltrate which in conjunction with the patient's rails and acuity of increasing dyspnea is consistent with CHF. Patient has had positive response to BiPAP. She'll be admitted for ongoing treatment.    Arby Barrette, MD 10/07/14 [phone]

## 2014-10-07 NOTE — Telephone Encounter (Signed)
Called spouse.pt was seen in ED 10/06/14 for SOB. Pt is still feeling SOB. appt scheduled to see TP this afternoon at 2:15. [place] [facility]

## 2014-10-07 NOTE — Progress Notes (Signed)
ANTICOAGULATION CONSULT NOTE - Initial Consult  Pharmacy Consult for Warfarin Indication: atrial fibrillation  Allergies  Allergen Reactions  . Ambien [Zolpidem] Other (See Comments)    Hallucinations  . Ativan [Lorazepam] Other (See Comments)    hallucinations  . Oxycontin [Oxycodone] Other (See Comments)    hallucinations  . Amiodarone   . Penicillins     Unknown childhood reaction   . Sulfa Antibiotics     unknown    Patient Measurements: Height: 5\' 10"  (177.8 cm) Weight: 152 lb 5.4 oz (69.1 kg) IBW/kg (Calculated) : 73 Heparin Dosing Weight:   Vital Signs: Temp: 98.2 F (36.8 C) (07/12 1258) Temp Source: Oral (07/12 1719) BP: 148/50 mmHg (07/12 1719) Pulse Rate: 93 (07/12 1719)  Labs:  Recent Labs  10/06/14 0130 10/07/14 1300  HGB 10.2* 11.3*  HCT 31.8* 35.0*  PLT 247 294  LABPROT 26.5* 28.3*  INR 2.48* 2.71*  CREATININE 1.99* 1.91*  TROPONINI  --  0.03    Estimated Creatinine Clearance: 29.6 mL/min (by C-G formula based on Cr of 1.91).   Medical History: Past Medical History  Diagnosis Date  . Arthritis   . Non-obstructive CAD     a. 12/2013 NSTEMI/Cath: LM nl, LAD 20, LCX 40-50, RCA 20.  Marland Kitchen Hypertension   . Stroke     a. July 2000;  b. January 2007;  c. TIA in 2014.  . CKD (chronic kidney disease), stage III   . GERD (gastroesophageal reflux disease)   . Foot pain, bilateral   . H/O hematuria   . Hyperlipidemia   . Vitamin D deficiency   . Chronic combined systolic and diastolic CHF (congestive heart failure)     a. 12/2013 Echo: EF 30-35%, mod conc LVH, Gr 3 DD, mild AI, mod-sev MR, mod dil LA, mild TR.  Marland Kitchen PAF (paroxysmal atrial fibrillation)     a. CHA2DS2VASc = 7--> Chronic Coumadin.  Marland Kitchen NICM (nonischemic cardiomyopathy)     a. 12/2013 Echo: EF 30-35%.  . Aortic valve prosthesis present     a. 2006: S/P bioprosthetic AVR.  Marland Kitchen PAD (peripheral artery disease)     a. s/p LLE stenting.  . Carotid arterial disease     a. s/p R CEA  .  History of tobacco abuse   . Moderate to Severe Mitral Regurgitation     a. 12/2013 Echo: Mod-Sev MR.  Marland Kitchen Anxiety   . COPD (chronic obstructive pulmonary disease)   . Coronary arteriosclerosis Oct 2015    med Rx  . Shortness of breath dyspnea   . Depression   . Presence of permanent cardiac pacemaker   . Heart murmur   . Dysrhythmia     Medications:  Prescriptions prior to admission  Medication Sig Dispense Refill Last Dose  . acetaminophen (TYLENOL) 500 MG tablet Take 1,000 mg by mouth at bedtime.    10/06/2014 at Unknown time  . allopurinol (ZYLOPRIM) 100 MG tablet Take 200 mg by mouth daily.   10/07/2014 at Unknown time  . atorvastatin (LIPITOR) 40 MG tablet Take 40 mg by mouth every evening.   10/06/2014 at Unknown time  . Calcium Carb-Cholecalciferol (CALCIUM 600 + D PO) Take 2 tablets by mouth every morning.   10/06/2014 at Unknown time  . carvedilol (COREG) 6.25 MG tablet Take 1 tablet (6.25 mg total) by mouth 2 (two) times daily with a meal. 60 tablet 3 10/07/2014 at 800  . clopidogrel (PLAVIX) 75 MG tablet Take 75 mg by mouth daily.   10/07/2014 at  Unknown time  . DULoxetine (CYMBALTA) 60 MG capsule Take 60 mg by mouth daily.    10/07/2014 at Unknown time  . Omega-3 Fatty Acids (FISH OIL) 1200 MG CAPS Take 1 capsule by mouth every morning.   10/07/2014 at Unknown time  . omeprazole (PRILOSEC) 40 MG capsule Take 40 mg by mouth every morning.   10/07/2014 at Unknown time  . potassium chloride SA (K-DUR,KLOR-CON) 20 MEQ tablet Take 1 tablet (20 mEq total) by mouth as needed. TAKE ONLY WHEN YOU TAKE METOLAZONE 30 tablet 3 months ago  . predniSONE (DELTASONE) 50 MG tablet One tablet PO daily for 4 days (Patient taking differently: Take 50 mg by mouth daily with breakfast. One tablet PO daily for 4 days) 4 tablet 0 10/07/2014 at Unknown time  . spironolactone (ALDACTONE) 25 MG tablet Take 0.5 tablets (12.5 mg total) by mouth daily. 15 tablet 3 10/07/2014 at Unknown time  . tamsulosin (FLOMAX) 0.4  MG CAPS capsule Take 2 capsules (0.8 mg total) by mouth at bedtime. 60 capsule 0 10/06/2014 at Unknown time  . torsemide (DEMADEX) 20 MG tablet Take 2 tablets (40 mg total) by mouth 2 (two) times daily. 56 tablet 3 10/07/2014 at Unknown time  . warfarin (COUMADIN) 2 MG tablet Take 2 mg by mouth daily.   10/06/2014 at Unknown time   Scheduled:  . [START ON 10/08/2014] allopurinol  200 mg Oral Daily  . atorvastatin  40 mg Oral QPM  . carvedilol  6.25 mg Oral BID WC  . [START ON 10/08/2014] clopidogrel  75 mg Oral Daily  . DULoxetine  60 mg Oral Daily  . sodium chloride  3 mL Intravenous Q12H  . [START ON 10/08/2014] spironolactone  12.5 mg Oral Daily  . tamsulosin  0.8 mg Oral QHS   Infusions:    Assessment: 81yo male with history of Afib, HTN, CKD3, and NICM presents with SOB. Pharmacy is consulted to dose warfarin for atrial fibrillation. INR 2.7, Hgb 11.3, Plt 294, sCr 1.9.  PTA Warfarin Dose: /d with last dose 10/06/14  Goal of Therapy:  INR 2-3 Monitor platelets by anticoagulation protocol: Yes   Plan:  Warfarin  tonight x1 Daily INR/CBC Monitor s/sx of bleeding  Arlean Hopping. Newman Pies, PharmD Clinical Pharmacist Pager [phone] Delle Reining M 10/07/2014,5:47 PM

## 2014-10-07 NOTE — ED Notes (Signed)
Attempted Report 

## 2014-10-07 NOTE — H&P (Signed)
Date: 10/07/2014               Patient Name:  Timothy Durham MRN: [id number]  DOB: 27-Jan-1931 Age / Sex: 81 y.o., male   PCP: Valaria Good. Karle Starch, MD              Medical Service: Internal Medicine Teaching Service              Attending Physician: Dr. Oval Linsey, MD    First Contact: Debbra Riding, MS3 Pager: [phone]  Second Contact: Dr. Maryellen Pile Pager: [phone]  Third Contact Dr. Bing Neighbors Pager: [phone]       After Hours (After 5p/  First Contact Pager: [phone]  weekends / holidays): Second Contact Pager: [phone]   Chief Complaint: "shortness of breath"  History of Present Illness: Timothy Durham is a 81 y.o. male with a complex past medical history, significant for HTN, CHF, CAD, COPD, multiple strokes ('00, '07, '14), CKD grade III,  s/p biventricular pacemaker placement (11/15) and aortic valve replacement (10/10), who presents with shortness of breath. Timothy Durham began experiencing difficulty breathing on 10/06/14 at midnight. The SOB had an acute onset, with a mild cough producing minimal yellow thick mucous, prompting him to go to the ED [facility] ED) where he had a CXR showing fluid in lungs bilaterally. He was diagnosed with an acute COPD exacerbation and was given $RemoveB'60mg'GYbNkqvS$  prednisone, and told to take prednisone $RemoveBeforeD'50mg'rGcHmAcRhuTyYx$  for four days, as well as his albuterol inhaler when needed. Today at 11:00 am, he once again began experiencing an acute difficulty in breathing.  He tired to use 2L [place] (normally only used at night), but continued to experience shortness of breath, worse than the previous day. His wife described hearing a "rattle" sound when he tried to inhale, and this prompted them to return to ED via EMS. He also endorses continual mild cough and mild swelling in both legs. He denies recent fever, chills, chest pain, headache, and dizziness.   History taken from Timothy Durham and his wife.  Meds: Prescriptions prior to admission  Medication Sig Dispense Refill  Last Dose  . acetaminophen (TYLENOL) 500 MG tablet Take 1,000 mg by mouth at bedtime.    10/06/2014 at Unknown time  . allopurinol (ZYLOPRIM) 100 MG tablet Take 200 mg by mouth daily.   10/07/2014 at Unknown time  . atorvastatin (LIPITOR) 40 MG tablet Take 40 mg by mouth every evening.   10/06/2014 at Unknown time  . Calcium Carb-Cholecalciferol (CALCIUM 600 + D PO) Take 2 tablets by mouth every morning.   10/06/2014 at Unknown time  . carvedilol (COREG) 6.25 MG tablet Take 1 tablet (6.25 mg total) by mouth 2 (two) times daily with a meal. 60 tablet 3 10/07/2014 at 800  . clopidogrel (PLAVIX) 75 MG tablet Take 75 mg by mouth daily.   10/07/2014 at Unknown time  . DULoxetine (CYMBALTA) 60 MG capsule Take 60 mg by mouth daily.    10/07/2014 at Unknown time  . Omega-3 Fatty Acids (FISH OIL) 1200 MG CAPS Take 1 capsule by mouth every morning.   10/07/2014 at Unknown time  . omeprazole (PRILOSEC) 40 MG capsule Take 40 mg by mouth every morning.   10/07/2014 at Unknown time  . potassium chloride SA (K-DUR,KLOR-CON) 20 MEQ tablet Take 1 tablet (20 mEq total) by mouth as needed. TAKE ONLY WHEN YOU TAKE METOLAZONE 30 tablet 3 months ago  . predniSONE (DELTASONE) 50 MG tablet One tablet PO daily for 4 days (Patient  taking differently: Take 50 mg by mouth daily with breakfast. One tablet PO daily for 4 days) 4 tablet 0 10/07/2014 at Unknown time  . spironolactone (ALDACTONE) 25 MG tablet Take 0.5 tablets (12.5 mg total) by mouth daily. 15 tablet 3 10/07/2014 at Unknown time  . tamsulosin (FLOMAX) 0.4 MG CAPS capsule Take 2 capsules (0.8 mg total) by mouth at bedtime. 60 capsule 0 10/06/2014 at Unknown time  . torsemide (DEMADEX) 20 MG tablet Take 2 tablets (40 mg total) by mouth 2 (two) times daily. 56 tablet 3 10/07/2014 at Unknown time  . warfarin (COUMADIN) 2 MG tablet Take 2 mg by mouth daily.   10/06/2014 at Unknown time   Current Facility-Administered Medications  Medication Dose Route Frequency Provider Last Rate  Last Dose  . acetaminophen (TYLENOL) tablet 650 mg  650 mg Oral Q6H PRN Lucious Groves, DO       Or  . acetaminophen (TYLENOL) suppository 650 mg  650 mg Rectal Q6H PRN Lucious Groves, DO      . albuterol (PROVENTIL) (2.5 MG/3ML) 0.083% nebulizer solution 2.5 mg  2.5 mg Nebulization Q2H PRN Lucious Groves, DO      . [START ON 10/08/2014] allopurinol (ZYLOPRIM) tablet 200 mg  200 mg Oral Daily Lucious Groves, DO      . antiseptic oral rinse (CPC / CETYLPYRIDINIUM CHLORIDE 0.05%) solution 7 mL  7 mL Mouth Rinse q12n4p Oval Linsey, MD   7 mL at 10/07/14 1820  . atorvastatin (LIPITOR) tablet 40 mg  40 mg Oral QPM Lucious Groves, DO      . carvedilol (COREG) tablet 6.25 mg  6.25 mg Oral BID WC Lucious Groves, DO      . chlorhexidine (PERIDEX) 0.12 % solution 15 mL  15 mL Mouth Rinse BID Oval Linsey, MD      . Derrill Memo ON 10/08/2014] clopidogrel (PLAVIX) tablet 75 mg  75 mg Oral Daily Lucious Groves, DO      . [START ON 10/08/2014] DULoxetine (CYMBALTA) DR capsule 60 mg  60 mg Oral Daily Lucious Groves, DO      . furosemide (LASIX) injection 40 mg  40 mg Intravenous BID Lucious Groves, DO   40 mg at 10/07/14 1820  . sodium chloride 0.9 % injection 3 mL  3 mL Intravenous Q12H Lucious Groves, DO      . [START ON 10/08/2014] spironolactone (ALDACTONE) tablet 12.5 mg  12.5 mg Oral Daily Lucious Groves, DO      . tamsulosin (FLOMAX) capsule 0.8 mg  0.8 mg Oral QHS Lucious Groves, DO      . warfarin (COUMADIN) tablet 2 mg  2 mg Oral ONCE-1800 Rebecka Apley, [facility]      . Warfarin - Pharmacist Dosing Inpatient   Does not apply [place], [facility]        Allergies: Allergies as of 10/07/2014 - Review Complete 10/07/2014  Allergen Reaction Noted  . Ambien [zolpidem] Other (See Comments) 01/13/2014  . Ativan [lorazepam] Other (See Comments) 01/13/2014  . Oxycontin [oxycodone] Other (See Comments) 04/10/2014  . Amiodarone  08/18/2014  . Penicillins  03/25/2013  . Sulfa antibiotics  03/25/2013    Past  Medical History: Past Medical History  Diagnosis Date  . Arthritis   . Non-obstructive CAD     a. 12/2013 NSTEMI/Cath: LM nl, LAD 20, LCX 40-50, RCA 20.  Marland Kitchen Hypertension   . Stroke     a. July 2000;  b. January 2007;  c. TIA in 2014.  . CKD (chronic kidney disease), stage III   . GERD (gastroesophageal reflux disease)   . Foot pain, bilateral   . H/O hematuria   . Hyperlipidemia   . Vitamin D deficiency   . Chronic combined systolic and diastolic CHF (congestive heart failure)     a. 12/2013 Echo: EF 30-35%, mod conc LVH, Gr 3 DD, mild AI, mod-sev MR, mod dil LA, mild TR.  Marland Kitchen PAF (paroxysmal atrial fibrillation)     a. CHA2DS2VASc = 7--> Chronic Coumadin.  Marland Kitchen NICM (nonischemic cardiomyopathy)     a. 12/2013 Echo: EF 30-35%.  . Aortic valve prosthesis present     a. 2006: S/P bioprosthetic AVR.  Marland Kitchen PAD (peripheral artery disease)     a. s/p LLE stenting.  . Carotid arterial disease     a. s/p R CEA  . History of tobacco abuse   . Moderate to Severe Mitral Regurgitation     a. 12/2013 Echo: Mod-Sev MR.  Marland Kitchen Anxiety   . COPD (chronic obstructive pulmonary disease)   . Coronary arteriosclerosis Oct 2015    med Rx  . Shortness of breath dyspnea   . Depression   . Presence of permanent cardiac pacemaker   . Heart murmur   . Dysrhythmia     Past Surgical History: Past Surgical History  Procedure Laterality Date  . Aortic valve replacement (avr)/coronary artery bypass grafting (cabg)  12/2008  . Eye surgery    . Carotid angiogram  1996  . Left lower extremity stent    . Carotid endarterectomy    . Insert / replace / remove pacemaker    . Coronary angiogram  Oct 2015  . Pacemaker insertion  Nov 2015    MDT BiV  . Left and right heart catheterization with coronary angiogram N/A 01/14/2014    Procedure: LEFT AND RIGHT HEART CATHETERIZATION WITH CORONARY ANGIOGRAM;  Surgeon: Peter M Martinique, MD;  Location: [facility] CATH LAB;  Service: Cardiovascular;  Laterality: N/A;  . Bi-ventricular  pacemaker insertion N/A 02/17/2014    Procedure: BI-VENTRICULAR PACEMAKER INSERTION (CRT-P);  Surgeon: Evans Lance, MD;  Location[facility] CATH LAB;  Service: Cardiovascular;  Laterality: N/A;  . Coronary artery bypass graft    . Coronary angioplasty    . Tonsillectomy      Past Family History: Family History  Problem Relation Age of Onset  . Stroke Mother   . Cancer Father     Past Social History: History   Social History  . Marital Status: Married    Spouse Name: N/A  . Number of Children: N/A  . Years of Education: N/A   Occupational History  . Not on file.   Social History Main Topics  . Smoking status: Former Smoker -- 2.00 packs/day for 35 years    Types: Cigarettes    Quit date: 09/26/1998  . Smokeless tobacco: Never Used  . Alcohol Use: 0.6 oz/week    1 Cans of beer per week     Comment: drinks one can of beer or wine daily.  . Drug Use: No  . Sexual Activity: Not on file   Other Topics Concern  . Not on file   Social History Narrative   -lives with wife in [place] (09/2014)    Review of Systems: Constitutional: negative for fever, chills Respiratory: positive for SOB, cough CV: negative for chest pain, syncope, dizziness GI: negative for nausea, vomiting, diarrhea MSK: positive for chronic R foot  drop (present since a fall on 6/30, which he did not hit his head nor had any mental status changes from) GU: positive for recent UTI, finishing ciprofloxacin on 09/03/14 Neuro: negative for headache and recent changes in mental status  Physical Exam: Blood pressure 133/52, pulse 86, temperature 98.2 F (36.8 C), temperature source Axillary, resp. rate 17, height _0  (1.778 m), weight 69.1 kg (152 lb 5.4 oz), SpO2 95 %. BP 133/52 mmHg  Pulse 86  Temp(Src) 98.2 F (36.8 C) (Axillary)  Resp 17  Ht _1  (1.778 m)  Wt 69.1 kg (152 lb 5.4 oz)  BMI 21.86 kg/m2  SpO2 95% General: alert & cooperative man laying in bed with BIPAP mask, in no acute  distress HEENT: PERRL, conjunctiva clear, EOM's intact bilaterally, supple neck CV: RRR, normal S1 and S2 with occasional extra beats, no murmurs Chest: pacemaker in place with no erythema/edema Lung: crackles in lower lobes bilaterally , no increased work of breathing Abd: soft, nontender, normal bowel sounds, no masses Back: no CVA tenderness Neuro: PERRL, normal extraocular movements, 5/5 strength in UE and LE bilaterally, grossly oriented Ext: normal, atraumatic, no cyanosis or edema in all four extremities, +2 pulses in DP and PT  Lab results: Results for orders placed or performed during the hospital encounter of 10/07/14 (from the past 24 hour(s))  Comprehensive metabolic panel     Status: Abnormal   Collection Time: 10/07/14  1:00 PM  Result Value Ref Range   Sodium 134 (L) 135 - 145 mmol/L   Potassium 4.5 3.5 - 5.1 mmol/L   Chloride 96 (L) 101 - 111 mmol/L   CO2 26 22 - 32 mmol/L   Glucose, Bld 163 (H) 65 - 99 mg/dL   BUN 33 (H) 6 - 20 mg/dL   Creatinine, Ser 1.91 (H) 0.61 - 1.24 mg/dL   Calcium 8.9 8.9 - 10.3 mg/dL   Total Protein 6.6 6.5 - 8.1 g/dL   Albumin 3.9 3.5 - 5.0 g/dL   AST 19 15 - 41 U/L   ALT 12 (L) 17 - 63 U/L   Alkaline Phosphatase 69 38 - 126 U/L   Total Bilirubin 0.7 0.3 - 1.2 mg/dL   GFR calc non Af Amer 31 (L) >60 mL/min   GFR calc Af Amer 36 (L) >60 mL/min   Anion gap 12 5 - 15  Brain natriuretic peptide     Status: Abnormal   Collection Time: 10/07/14  1:00 PM  Result Value Ref Range   B Natriuretic Peptide 1758.5 (H) 0.0 - 100.0 pg/mL  Troponin I     Status: None   Collection Time: 10/07/14  1:00 PM  Result Value Ref Range   Troponin I 0.03 <0.031 ng/mL  CBC with Differential     Status: Abnormal   Collection Time: 10/07/14  1:00 PM  Result Value Ref Range   WBC 15.6 (H) 4.0 - 10.5 K/uL   RBC 3.78 (L) 4.22 - 5.81 MIL/uL   Hemoglobin 11.3 (L) 13.0 - 17.0 g/dL   HCT 35.0 (L) 39.0 - 52.0 %   MCV 92.6 78.0 - 100.0 fL   MCH 29.9 26.0 - 34.0 pg    MCHC 32.3 30.0 - 36.0 g/dL   RDW 16.6 (H) 11.5 - 15.5 %   Platelets 294 150 - 400 K/uL   Neutrophils Relative % 95 (H) 43 - 77 %   Neutro Abs 14.8 (H) 1.7 - 7.7 K/uL   Lymphocytes Relative 3 (L) 12 - 46 %  Lymphs Abs 0.4 (L) 0.7 - 4.0 K/uL   Monocytes Relative 1 (L) 3 - 12 %   Monocytes Absolute 0.2 0.1 - 1.0 K/uL   Eosinophils Relative 1 0 - 5 %   Eosinophils Absolute 0.1 0.0 - 0.7 K/uL   Basophils Relative 0 0 - 1 %   Basophils Absolute 0.0 0.0 - 0.1 K/uL  Protime-INR     Status: Abnormal   Collection Time: 10/07/14  1:00 PM  Result Value Ref Range   Prothrombin Time 28.3 (H) 11.6 - 15.2 seconds   INR 2.71 (H) 0.00 - 1.49  I-Stat arterial blood gas, ED     Status: Abnormal   Collection Time: 10/07/14  1:20 PM  Result Value Ref Range   pH, Arterial 7.404 7.350 - 7.450   pCO2 arterial 47.5 (H) 35.0 - 45.0 mmHg   pO2, Arterial 92.0 80.0 - 100.0 mmHg   Bicarbonate 29.6 (H) 20.0 - 24.0 mEq/L   TCO2 31 0 - 100 mmol/L   O2 Saturation 97.0 %   Acid-Base Excess 4.0 (H) 0.0 - 2.0 mmol/L   Patient temperature 98.6 F    Collection site RADIAL, ALLEN'S TEST ACCEPTABLE    Drawn by RT    Sample type ARTERIAL   Urinalysis, Routine w reflex microscopic (not at [facility])     Status: None   Collection Time: 10/07/14  3:31 PM  Result Value Ref Range   Color, Urine YELLOW YELLOW   APPearance CLEAR CLEAR   Specific Gravity, Urine 1.007 1.005 - 1.030   pH 6.5 5.0 - 8.0   Glucose, UA NEGATIVE NEGATIVE mg/dL   Hgb urine dipstick NEGATIVE NEGATIVE   Bilirubin Urine NEGATIVE NEGATIVE   Ketones, ur NEGATIVE NEGATIVE mg/dL   Protein, ur NEGATIVE NEGATIVE mg/dL   Urobilinogen, UA 0.2 0.0 - 1.0 mg/dL   Nitrite NEGATIVE NEGATIVE   Leukocytes, UA NEGATIVE NEGATIVE    Imaging results:  I personally reviewed imaging: CXR 7/12 (portable AP) showing increased diffuse interstitial opacities bilaterally with blunting of CVA angle bilaterally, opacity in right minor fissure consistent with fluid, mild  cardiomegaly, left-sided pacemaker unchanged in position, and overlying EKG leads.  Radiology Impressions: Dg Chest Port 1 View  10/07/2014   CLINICAL DATA:  Shortness of breath.  EXAM: PORTABLE CHEST - 1 VIEW  COMPARISON:  October 06, 2014.  FINDINGS: Stable cardiomediastinal silhouette. Left-sided pacemaker is unchanged in position. Status post coronary artery bypass graft. No pneumothorax is noted. Stable small bilateral pleural effusions are noted. There appears to be increased left parahilar and right basilar interstitial opacities which may simply be due to radiographic technique, but increased pulmonary edema superimpose chronic scarring cannot be excluded. Rounded opacity seen in the region the right minor fissure most consistent with loculated fluid or pseudotumor. Bony thorax is intact.  IMPRESSION: Increased bilateral interstitial lung opacities are noted which may simply be due to radiographic technique, but increased pulmonary edema superimposed upon underlying chronic scarring cannot be excluded. Stable small bilateral pleural effusions are noted, with right greater than left. Stable small loculated effusion seen in right minor fissure.   Electronically Signed   By: Marijo Conception, M.D.   On: 10/07/2014 13:51   Dg Chest Portable 1 View  10/06/2014   CLINICAL DATA:  81 year old male with shortness of breath  EXAM: PORTABLE CHEST - 1 VIEW  COMPARISON:  Chest radiograph dated 08/18/2014  FINDINGS: Single-view of the chest demonstrates emphysematous changes of the lungs. Linear scarring noted in the right mid  lung field. There are bilateral lower lung field interstitial prominence which may be atelectatic changes or scarring. Pneumonia is not excluded. Small bilateral pleural effusions. Stable cardiac silhouette. Median sternotomy wires and left pectoral pacemaker device. The osseous structures appear unremarkable. Probable calcified right hilar granuloma.  IMPRESSION: Small bilateral pleural  effusions with bilateral lower lung field atelectasis /scarring versus pneumonia. Clinical correlation and follow-up recommended.   Electronically Signed   By: Anner Crete M.D.   On: 10/06/2014 01:52    Other results: Please see official EKG interpretation in Epic. My intepretation is outlined below. EKG: Tachycardia (HR 102), regular rhythm, LAD, P waves not well seen, widened QRS (0.16s), unchanged from 08/15/14 elevated ST segments in leads V3-V5, occasional PVC's  Assessment & Plan by Problem: Principal Problem:   Acute on chronic combined systolic and diastolic congestive heart failure Active Problems:   Hypertension   CKD (chronic kidney disease), stage III   Tissue AVR 2006 (in [place])   PAF   Hyperlipidemia   Amiodarone pulmonary toxicity   COPD (chronic obstructive pulmonary disease)   Timothy Durham is a 81 y.o. male with a complex past medical history, significant for HTN, CHF, CAD, COPD, multiple strokes ('00, '07, '14), CKD grade III, arthritis,  s/p biventricular pacemaker placement (11/15) and aortic valve replacement (10/10), who presents with shortness of breath, most likely due to an acute CHF exacerbation. Differential diagnosis also include an acute COPD exacerbation and pneumonia.   Shortness of Breath/CHF/HTN: Pt placed on BIPAP and Furosemide 40 mg was given in the ED, with great improvements of shortness of breath. Biventricular pacemaker in place and will continue home CHF/HTN medications. - furosemide 40 mg BID - continue atorvastatin 40 mg QHS - continue carvedilol 6.25 mg BID with meals - spironolactone 12.5 mg QID - clopidogrel 75 mg QID - Labs: BMP and CBC in AM  Paroxsymal Atrial Fibrillation: s/p bioprosthetic aortic valve replacement 2006. INR 2.71 (therapeutic) on admission. - warfarin 59m daily - clopidogrel 75 mg QID  History of stroke: Past strokes in 2000, 2007, and 2014, with multiple TIA's in 2015.  - anticoagulation with warfarin (listed  above)  COPD: Former smoker. O2 sats appropriate at 92-100. Discharged from ED on 7/11, where diagnosed with COPD exacerbation, given prednisone taper (539mfor 4 days), which will hold for now. - albuterol nebulizer PRN SOB  - BiPAP PRN SOB - continual O2 monitoring  Depression/Anxiety: No need for acute interventions. Continue on home med. - Duloxetine 60 mg daily  CKD grade III: eGFR of 31, creat 1.91, BUN 33 on admission - allopurinol 20027mID (due to history of gout) - continue HTN medications listed earlier - BMP in AM  Osteoarthritis: Continue on home meds, no need for acute intervention. - tylenol 500 mg PRN pain  Dipso: Step down unit   FEN: heart healthy diet DVT Prophylaxis: warfarin  IV Access: PIV in R arm Code Status: DNR  This is a [employer] information officerte.  The care of the patient was discussed with Dr. HofHeber Carolinad Dr. BosCharlynn Grimesd the assessment and plan was formulated with their assistance.  Please see their note for official documentation of the patient encounter.   Signed: CamDebbra Ridinged Student 10/07/2014, 6:21 PM

## 2014-10-07 NOTE — H&P (Signed)
Date: 10/07/2014               Patient Name:  Timothy Durham MRN: [id number]  DOB: 1930-09-20 Age / Sex: 81 y.o., male   PCP: Valaria Good. Karle Starch, MD         Medical Service: Internal Medicine Teaching Service         Attending Physician: Dr. Oval Linsey, MD    First Contact: Dr. Charlynn Grimes Pager: [phone]  Second Contact: Dr. Denton Brick Pager: [phone]       After Hours (After 5p/  First Contact Pager: [phone]  weekends / holidays): Second Contact Pager: [phone]   Chief Complaint: Shortness of Breath  History of Present Illness: 81 year old white male with a PMH significant for combined systolic and diastolic CHF (ECHO 78/6767 EF 30-35%), PAF, Aortic valve replacement, HTN, Stroke, COPD comes to the ED with complaints of worsening SOB. Patient was on BiPAP can could not communicate well, wife was at the bedside and provided most of the history. Symptoms started suddenly yesterday overnight. He woke up after 1-2 hours of sleep with SOB and went to the [facility]. He was treated for a COPD exacerbation there, receiving nebs and steroids. Symptoms resolved within a few hours and he was sent home with a Prednisone taper. Symptoms return suddenly this morning with worsening SOB and a productive cough with yellow sputum. Weights have been stable at home, 152-153 everyday.   BNP was 1758 today from 971 yesterday. CXR bilateral interstitial lung opacities with increased pulmonary edema and stable small bilateral pleural effusions. He was put on BiPAP in the ED and given a dose of IV lasix with good diuresis. He tolerated BiPAP well and by the time we saw him after diuresis he no long complained of SOB. He denies any fevers, chills, chest pain, leg edema, dysuria. We stopped BiPAP and switched to 2L [place], sating 92-96%. We admitted him to step down until with BiPAP as needed. He is on 2L O2 at night at home, sleeps with one pillow behind his back.   Meds: No current facility-administered medications  for this encounter.    Allergies: Allergies as of 10/07/2014 - Review Complete 10/06/2014  Allergen Reaction Noted  . Ambien [zolpidem] Other (See Comments) 01/13/2014  . Ativan [lorazepam] Other (See Comments) 01/13/2014  . Oxycontin [oxycodone] Other (See Comments) 04/10/2014  . Amiodarone  08/18/2014  . Penicillins  03/25/2013  . Sulfa antibiotics  03/25/2013   Past Medical History  Diagnosis Date  . Arthritis   . Non-obstructive CAD     a. 12/2013 NSTEMI/Cath: LM nl, LAD 20, LCX 40-50, RCA 20.  Marland Kitchen Hypertension   . Stroke     a. July 2000;  b. January 2007;  c. TIA in 2014.  . CKD (chronic kidney disease), stage III   . GERD (gastroesophageal reflux disease)   . Foot pain, bilateral   . H/O hematuria   . Hyperlipidemia   . Vitamin D deficiency   . Chronic combined systolic and diastolic CHF (congestive heart failure)     a. 12/2013 Echo: EF 30-35%, mod conc LVH, Gr 3 DD, mild AI, mod-sev MR, mod dil LA, mild TR.  Marland Kitchen PAF (paroxysmal atrial fibrillation)     a. CHA2DS2VASc = 7--> Chronic Coumadin.  Marland Kitchen NICM (nonischemic cardiomyopathy)     a. 12/2013 Echo: EF 30-35%.  . Aortic valve prosthesis present     a. 2006: S/P bioprosthetic AVR.  Marland Kitchen PAD (peripheral artery disease)  a. s/p LLE stenting.  . Carotid arterial disease     a. s/p R CEA  . History of tobacco abuse   . Moderate to Severe Mitral Regurgitation     a. 12/2013 Echo: Mod-Sev MR.  Marland Kitchen Anxiety   . COPD (chronic obstructive pulmonary disease)   . Coronary arteriosclerosis Oct 2015    med Rx  . Shortness of breath dyspnea   . Depression   . Presence of permanent cardiac pacemaker   . Heart murmur   . Dysrhythmia    Past Surgical History  Procedure Laterality Date  . Aortic valve replacement (avr)/coronary artery bypass grafting (cabg)  12/2008  . Eye surgery    . Carotid angiogram  1996  . Left lower extremity stent    . Carotid endarterectomy    . Insert / replace / remove pacemaker    . Coronary  angiogram  Oct 2015  . Pacemaker insertion  Nov 2015    MDT BiV  . Left and right heart catheterization with coronary angiogram N/A 01/14/2014    Procedure: LEFT AND RIGHT HEART CATHETERIZATION WITH CORONARY ANGIOGRAM;  Surgeon: Peter M Martinique, MD;  Location: [facility] CATH LAB;  Service: Cardiovascular;  Laterality: N/A;  . Bi-ventricular pacemaker insertion N/A 02/17/2014    Procedure: BI-VENTRICULAR PACEMAKER INSERTION (CRT-P);  Surgeon: Evans Lance, MD;  Location: [facility] CATH LAB;  Service: Cardiovascular;  Laterality: N/A;  . Coronary artery bypass graft    . Coronary angioplasty    . Tonsillectomy     Family History  Problem Relation Age of Onset  . Stroke Mother   . Cancer Father    History   Social History  . Marital Status: Married    Spouse Name: N/A  . Number of Children: N/A  . Years of Education: N/A   Occupational History  . Not on file.   Social History Main Topics  . Smoking status: Former Smoker -- 2.00 packs/day for 35 years    Types: Cigarettes    Quit date: 09/26/1998  . Smokeless tobacco: Never Used  . Alcohol Use: 0.6 oz/week    1 Cans of beer per week     Comment: drinks one can of beer or wine daily.  . Drug Use: No  . Sexual Activity: Not on file   Other Topics Concern  . Not on file   Social History Narrative   -lives with wife in [place] (09/2014)    Review of Systems: Review of Systems  Constitutional: Negative for fever and chills.  Respiratory: Positive for cough, sputum production and shortness of breath. Negative for hemoptysis and wheezing.   Cardiovascular: Negative for chest pain.  Gastrointestinal: Negative for nausea, vomiting, abdominal pain, diarrhea and constipation.  Genitourinary: Negative for dysuria.  Neurological: Negative for headaches.   Physical Exam: Blood pressure 131/56, pulse 87, temperature 98.2 F (36.8 C), temperature source Axillary, resp. rate 15, SpO2 93 %.   GENERAL- alert, co-operative, appears as stated  age, not in any distress. HEENT- Atraumatic, normocephalic, PERRL, EOMI, oral mucosa appears moist, good and intact dentition. No carotid bruit, no cervical LN enlargement, thyroid does not appear enlarged, neck supple. CARDIAC- RRR, no murmurs, rubs or gallops, pacemaker  RESP- Moving equal volumes of air, and clear to auscultation bilaterally, no wheezes, bibasilar crackles  ABDOMEN- Soft, nontender, no guarding or rebound, no palpable masses or organomegaly, bowel sounds present. BACK- Normal curvature of the spine, No tenderness along the vertebrae, no CVA tenderness. NEURO- No obvious Cr  N abnormality, strenght upper and lower extremities- 5/5, Sensation intact- globally, unable to dorsiflex right foot EXTREMITIES- pulse 1+, symmetric, trace pedal edema. SKIN- Warm, dry, No rash or lesion. PSYCH- Normal mood and affect, appropriate thought content and speech.  Lab results: Basic Metabolic Panel:  Recent Labs  10/06/14 0130 10/07/14 1300  NA 135 134*  K 3.8 4.5  CL 95* 96*  CO2 29 26  GLUCOSE 107* 163*  BUN 39* 33*  CREATININE 1.99* 1.91*  CALCIUM 8.7* 8.9   Liver Function Tests:  Recent Labs  10/07/14 1300  AST 19  ALT 12*  ALKPHOS 69  BILITOT 0.7  PROT 6.6  ALBUMIN 3.9   CBC:  Recent Labs  10/06/14 0130 10/07/14 1300  WBC 8.3 15.6*  NEUTROABS 6.7 14.8*  HGB 10.2* 11.3*  HCT 31.8* 35.0*  MCV 94.4 92.6  PLT 247 294   Cardiac Enzymes:  Recent Labs  10/07/14 1300  TROPONINI 0.03   Coagulation:  Recent Labs  10/06/14 0130 10/07/14 1300  LABPROT 26.5* 28.3*  INR 2.48* 2.71*   Urine Drug Screen: Drugs of Abuse     Component Value Date/Time   LABOPIA NONE DETECTED 03/26/2013 1408   COCAINSCRNUR NONE DETECTED 03/26/2013 1408   LABBENZ NONE DETECTED 03/26/2013 1408   AMPHETMU NONE DETECTED 03/26/2013 1408   THCU NONE DETECTED 03/26/2013 1408   LABBARB NONE DETECTED 03/26/2013 1408    Urinalysis:  Recent Labs  10/07/14 1531  COLORURINE  YELLOW  LABSPEC 1.007  PHURINE 6.5  GLUCOSEU NEGATIVE  HGBUR NEGATIVE  BILIRUBINUR NEGATIVE  KETONESUR NEGATIVE  PROTEINUR NEGATIVE  UROBILINOGEN 0.2  NITRITE NEGATIVE  LEUKOCYTESUR NEGATIVE   Imaging results:  Dg Chest Port 1 View  10/07/2014   CLINICAL DATA:  Shortness of breath.  EXAM: PORTABLE CHEST - 1 VIEW  COMPARISON:  October 06, 2014.  FINDINGS: Stable cardiomediastinal silhouette. Left-sided pacemaker is unchanged in position. Status post coronary artery bypass graft. No pneumothorax is noted. Stable small bilateral pleural effusions are noted. There appears to be increased left parahilar and right basilar interstitial opacities which may simply be due to radiographic technique, but increased pulmonary edema superimpose chronic scarring cannot be excluded. Rounded opacity seen in the region the right minor fissure most consistent with loculated fluid or pseudotumor. Bony thorax is intact.  IMPRESSION: Increased bilateral interstitial lung opacities are noted which may simply be due to radiographic technique, but increased pulmonary edema superimposed upon underlying chronic scarring cannot be excluded. Stable small bilateral pleural effusions are noted, with right greater than left. Stable small loculated effusion seen in right minor fissure.   Electronically Signed   By: Marijo Conception, M.D.   On: 10/07/2014 13:51   Dg Chest Portable 1 View  10/06/2014   CLINICAL DATA:  81 year old male with shortness of breath  EXAM: PORTABLE CHEST - 1 VIEW  COMPARISON:  Chest radiograph dated 08/18/2014  FINDINGS: Single-view of the chest demonstrates emphysematous changes of the lungs. Linear scarring noted in the right mid lung field. There are bilateral lower lung field interstitial prominence which may be atelectatic changes or scarring. Pneumonia is not excluded. Small bilateral pleural effusions. Stable cardiac silhouette. Median sternotomy wires and left pectoral pacemaker device. The osseous  structures appear unremarkable. Probable calcified right hilar granuloma.  IMPRESSION: Small bilateral pleural effusions with bilateral lower lung field atelectasis /scarring versus pneumonia. Clinical correlation and follow-up recommended.   Electronically Signed   By: Anner Crete M.D.   On: 10/06/2014 01:52  Other results: EKG: unchanged from previous tracings, sinus tachycardia.  Assessment & Plan by Problem: Principal Problem:   Acute on chronic combined systolic and diastolic congestive heart failure Active Problems:   Hypertension   CKD (chronic kidney disease), stage III   PAF   Hyperlipidemia   COPD (chronic obstructive pulmonary disease)  Mr. Scadden is an 81 year old gentleman with a complex past history who comes in with acute SOB. BNP was elevated. Likely 2/2 CHF exacerbation but cannot rule out COPD or pneumonia at this time.   Acute on Chronic combined systolic and diastolic CHF: Pt did well after receiving IV lasix and BiPAP in the ED. SOB improved significantly. Were able to put him on 2L  with sats remaining 92-96%. Will place in on the step-down unit and have BiPAP available if needed. He does have bibasilar crackles on exam. WBC elevated and CXR cannot rule out pneumonia. Will recheck CXR in AM. Will continue home CHF and HTN medications and continue giving lasix. Troponin negative x1.  - Lasix IV 40 mg BID - Coreg 6.25 mg BID - Spironolactone 12.5 mg qdaily - Clopidogrel 75 mg qdaily - Will check CBC in AM and trend WBC  - Repeat PA CXR in AM  - Telemetry   COPD: Former smoker, 02 sat 92-96% on 2L. He was discharged from the ED on 7/11 with suspected COPD exacerbation after improving with steroids. Sent home prednisone taper. Symptoms returned despite steroid treatment.  - Will hold steroids for now given this exacerbation occurred while on steroids - albuterol nebs prn  - BiPAP prn  - Continuous pulse ox  HTN: BP has been in the 130-140s. Will continue  home medications.  - See above  CKD (Chronic Kidney Disease) Stage III: eGFR 31, Cr 1.91, BUN 33 on admission. - Started Lasix IV 40 mg BID - Continue home spironolactone 12.5 mg qdaily - Allopurinol 262m qdaily (hx of gout) - Check BMP in AM, trend Cr  PAF: On warfarin, INR 2.71 on admission. S/p bioprosthetic aortic valve replacement in 2006. - Continue warfarin 2 mg qdaily - Continue clopidogrel 75 mg qdaily  - Per pharmacy  HLD: On atorvastain 40 mg qdaily.  - Continue home meds  Depression/Anxiety: On Duloxetine 60 mg daily. - Continue home meds  DVT PPX: warfarin per pharmacy  Code status: DNR  Dispo: Disposition is deferred at this time, awaiting improvement of current medical problems. Anticipated discharge in approximately 1-3 day(s).   The patient does have a current PCP (Marciano SequinM. CKarle Starch MD) and [facility].  The patient does not have transportation limitations that hinder transportation to clinic appointments.  Signed: NMaryellen Pile MD 10/07/2014, 5:08 PM

## 2014-10-07 NOTE — ED Notes (Addendum)
Pt in from home via [facility], per report pt was seen @ [facility] yesterday with possible dx of PNA, pt called out today for increased SOB without relief with 2 L [place]- use at home, pt presents to ED on CPAP, pt A&Ox4, pt recently taken off of Amiodarone, pt reported to have rales in all lung fields, pt has bi ventricular paced rhythm

## 2014-10-07 NOTE — Telephone Encounter (Signed)
Called patient back, per previous phone note. Spoke with wife, told her per Dr. Shirlee Latch its ok for Timothy Durham to take Albuterol until feels better. She asked about Prednisone dosage he was given and I advised her to call Dr. Vassie Loll since it was dealing with his COPD.

## 2014-10-08 ENCOUNTER — Encounter: Payer: Self-pay | Admitting: Cardiology

## 2014-10-08 ENCOUNTER — Inpatient Hospital Stay (HOSPITAL_COMMUNITY): Payer: Medicare HMO

## 2014-10-08 DIAGNOSIS — Z954 Presence of other heart-valve replacement: Secondary | ICD-10-CM

## 2014-10-08 DIAGNOSIS — Z87891 Personal history of nicotine dependence: Secondary | ICD-10-CM

## 2014-10-08 DIAGNOSIS — J449 Chronic obstructive pulmonary disease, unspecified: Secondary | ICD-10-CM

## 2014-10-08 DIAGNOSIS — E785 Hyperlipidemia, unspecified: Secondary | ICD-10-CM

## 2014-10-08 DIAGNOSIS — J438 Other emphysema: Secondary | ICD-10-CM

## 2014-10-08 DIAGNOSIS — F418 Other specified anxiety disorders: Secondary | ICD-10-CM

## 2014-10-08 DIAGNOSIS — N183 Chronic kidney disease, stage 3 (moderate): Secondary | ICD-10-CM

## 2014-10-08 DIAGNOSIS — Z7901 Long term (current) use of anticoagulants: Secondary | ICD-10-CM

## 2014-10-08 DIAGNOSIS — I5043 Acute on chronic combined systolic (congestive) and diastolic (congestive) heart failure: Principal | ICD-10-CM

## 2014-10-08 DIAGNOSIS — I129 Hypertensive chronic kidney disease with stage 1 through stage 4 chronic kidney disease, or unspecified chronic kidney disease: Secondary | ICD-10-CM

## 2014-10-08 DIAGNOSIS — I480 Paroxysmal atrial fibrillation: Secondary | ICD-10-CM

## 2014-10-08 LAB — CBC
HCT: 30.1 % — ABNORMAL LOW (ref 39.0–52.0)
Hemoglobin: 9.9 g/dL — ABNORMAL LOW (ref 13.0–17.0)
MCH: 30.1 pg (ref 26.0–34.0)
MCHC: 32.9 g/dL (ref 30.0–36.0)
MCV: 91.5 fL (ref 78.0–100.0)
Platelets: 253 10*3/uL (ref 150–400)
RBC: 3.29 MIL/uL — ABNORMAL LOW (ref 4.22–5.81)
RDW: 16.4 % — ABNORMAL HIGH (ref 11.5–15.5)
WBC: 8.5 10*3/uL (ref 4.0–10.5)

## 2014-10-08 LAB — BASIC METABOLIC PANEL
ANION GAP: 10 (ref 5–15)
BUN: 33 mg/dL — ABNORMAL HIGH (ref 6–20)
CO2: 32 mmol/L (ref 22–32)
Calcium: 9.2 mg/dL (ref 8.9–10.3)
Chloride: 97 mmol/L — ABNORMAL LOW (ref 101–111)
Creatinine, Ser: 1.95 mg/dL — ABNORMAL HIGH (ref 0.61–1.24)
GFR calc Af Amer: 35 mL/min — ABNORMAL LOW (ref >60)
GFR calc non Af Amer: 31 mL/min — ABNORMAL LOW (ref >60)
GLUCOSE: 124 mg/dL — AB (ref 65–99)
Potassium: 4.1 mmol/L (ref 3.5–5.1)
Sodium: 139 mmol/L (ref 135–145)

## 2014-10-08 LAB — PROTIME-INR
INR: 2.48 — AB (ref 0.00–1.49)
Prothrombin Time: 26.6 seconds — ABNORMAL HIGH (ref 11.6–15.2)

## 2014-10-08 LAB — TROPONIN I: Troponin I: 0.04 ng/mL — ABNORMAL HIGH (ref <0.031)

## 2014-10-08 MED ORDER — WARFARIN SODIUM 2 MG PO TABS
2.0000 mg | ORAL_TABLET | Freq: Every day | ORAL | Status: DC
  Administered 2014-10-08: 2 mg via ORAL
  Filled 2014-10-08 (×2): qty 1

## 2014-10-08 MED ORDER — LISINOPRIL 2.5 MG PO TABS
2.5000 mg | ORAL_TABLET | Freq: Every day | ORAL | Status: DC
  Filled 2014-10-08: qty 1

## 2014-10-08 MED ORDER — POLYETHYLENE GLYCOL 3350 17 G PO PACK
17.0000 g | PACK | Freq: Every day | ORAL | Status: DC | PRN
  Filled 2014-10-08: qty 1

## 2014-10-08 MED ORDER — TORSEMIDE 20 MG PO TABS
40.0000 mg | ORAL_TABLET | Freq: Every day | ORAL | Status: DC
  Administered 2014-10-08: 40 mg via ORAL
  Filled 2014-10-08 (×2): qty 2

## 2014-10-08 MED ORDER — DOCUSATE SODIUM 100 MG PO CAPS
100.0000 mg | ORAL_CAPSULE | Freq: Two times a day (BID) | ORAL | Status: DC
  Administered 2014-10-09: 100 mg via ORAL
  Filled 2014-10-08 (×3): qty 1

## 2014-10-08 MED ORDER — TORSEMIDE 20 MG PO TABS
60.0000 mg | ORAL_TABLET | Freq: Every day | ORAL | Status: DC
  Administered 2014-10-09: 60 mg via ORAL
  Filled 2014-10-08 (×2): qty 3

## 2014-10-08 NOTE — H&P (Signed)
Internal Medicine Attending Admission Note Date: 10/08/2014  Patient name: Timothy Durham Medical record number: [id number] Date of birth: [birth year] Age: 81 y.o. Gender: male  I saw and evaluated the patient. I reviewed the resident's note and I agree with the resident's findings and plan as documented in the resident's note.  Chief Complaint(s): Shortness of breath 1 day.  History - key components related to admission:  Timothy Durham is an 81 year old gentleman with a history of chronic systolic and diastolic heart failure, paroxysmal atrial fibrillation, status post prosthetic aortic valve replacement, chronic obstructive pulmonary disease with an FEV1 of 1.91, hypertension, and previous TIAs who presents with a one-day history of shortness of breath that began suddenly the night prior to admission. At that time, he woke up suddenly short of breath. He presented to the emergency department at [facility] and was diagnosed with a COPD exacerbation. He was started on a prednisone burst as well as albuterol. He was discharged home. After returning home his symptoms returned with increasing shortness of breath and a cough productive of yellow sputum. He therefore re-presented to the emergency department and this time was felt to have acute on chronic systolic and diastolic heart failure. He was placed on BiPAP to decrease his work of breathing and afterload and started on intravenous diuretics with a nearly 1-1/2 L diuresis. At that point his symptoms had improved markedly and he was removed from the BiPAP (within hours of its initiation). He was admitted to the internal medicine teaching service for further evaluation and care.  Since admission he was given additional IV diuretics with additional diuresis and is down approximately 2.2 L by the time he was seen on rounds this morning. Although he was tired, from a breathing standpoint he felt he was close to baseline. Interestingly, he denied any  orthopnea or paroxysmal nocturnal dyspnea but it must be remembered that he initially awoke from sleep short of breath. He is without other acute complaints. His wife keeps meticulous records and he has been compliant with his medications and diet. He may have strayed slightly from his fluid restriction given the heat and humidity recently.  Physical Exam - key components related to admission:  Filed Vitals:   10/08/14 0400 10/08/14 0725 10/08/14 1030 10/08/14 1536  BP: 121/66 122/47 114/37   Pulse: 84 86 76 77  Temp:  97.3 F (36.3 C) 97.3 F (36.3 C)   TempSrc:  Oral Oral   Resp: 16 26 22    Height:   5\' 10"  (1.778 m)   Weight:   149 lb 8 oz (67.813 kg)   SpO2: 92% 96% 97% 98%   Gen.: Well-developed, well-nourished, elderly man lying comfortably in bed in no acute distress. Neck: No jugular venous distention appreciated with the head of the bed elevated at 35. Lungs: Bibasilar inspiratory crackles without rhonchi or wheezes. Air movement was quite good. Heart: Regular rate and rhythm with a 2/6 systolic murmur best heard at the left sternal border. I could not appreciate any rubs or gallops currently. Abdomen: Soft, nontender, active bowel sounds. Extremities: Without edema.  Lab results:  Basic Metabolic Panel:  Recent Labs  40/98/11 1300 10/08/14 0230  NA 134* 139  K 4.5 4.1  CL 96* 97*  CO2 26 32  GLUCOSE 163* 124*  BUN 33* 33*  CREATININE 1.91* 1.95*  CALCIUM 8.9 9.2   Liver Function Tests:  Recent Labs  10/07/14 1300  AST 19  ALT 12*  ALKPHOS 69  BILITOT  0.7  PROT 6.6  ALBUMIN 3.9   CBC:  Recent Labs  10/06/14 0130 10/07/14 1300 10/08/14 0230  WBC 8.3 15.6* 8.5  NEUTROABS 6.7 14.8*  --   HGB 10.2* 11.3* 9.9*  HCT 31.8* 35.0* 30.1*  MCV 94.4 92.6 91.5  PLT 247 294 253   Cardiac Enzymes:  Recent Labs  10/07/14 1300 10/08/14 0937  TROPONINI 0.03 0.04*   Coagulation:  Recent Labs  10/07/14 1300 10/08/14 0230  INR 2.71* 2.48*    Urinalysis:  Specific gravity 1.007, pH 6.5, negative nitrite, negative leukocytes.  Misc. Labs:  BNP 1758.5 ABG on unknown FiO2 7.40/48/92  Imaging results:  X-ray Chest Pa And Lateral  10/08/2014   CLINICAL DATA:  Shortness of breath, history of aortic valve replacement, CABG, permanent pacemaker placement, COPD with history of heavy smoking.  EXAM: CHEST  2 VIEW  COMPARISON:  Portable chest x-ray of October 07, 2014  FINDINGS: The lungs remain hyperinflated. The pulmonary interstitial markings have markedly improved. The cardiac silhouette is more normal in size today the prosthetic aortic valve ring is visible. The central pulmonary vascularity is less engorged. There remain small bilateral pleural effusions greater on the right than on the left. There 7 intact sternal wires. The permanent pacemaker is in reasonable position. The bony thorax exhibits no acute abnormality.  IMPRESSION: Improving CHF superimposed upon COPD.  There is no pneumonia.   Electronically Signed   By: David  [place] M.D.   On: 10/08/2014 07:45   Dg Chest Port 1 View  10/07/2014   CLINICAL DATA:  Shortness of breath.  EXAM: PORTABLE CHEST - 1 VIEW  COMPARISON:  October 06, 2014.  FINDINGS: Stable cardiomediastinal silhouette. Left-sided pacemaker is unchanged in position. Status post coronary artery bypass graft. No pneumothorax is noted. Stable small bilateral pleural effusions are noted. There appears to be increased left parahilar and right basilar interstitial opacities which may simply be due to radiographic technique, but increased pulmonary edema superimpose chronic scarring cannot be excluded. Rounded opacity seen in the region the right minor fissure most consistent with loculated fluid or pseudotumor. Bony thorax is intact.  IMPRESSION: Increased bilateral interstitial lung opacities are noted which may simply be due to radiographic technique, but increased pulmonary edema superimposed upon underlying chronic scarring  cannot be excluded. Stable small bilateral pleural effusions are noted, with right greater than left. Stable small loculated effusion seen in right minor fissure.   Electronically Signed   By: Lupita Raider, M.D.   On: 10/07/2014 13:51   Other results:  EKG: Normal sinus tachycardia to 102 bpm, indeterminate axis, QRS widening in a nonspecific bundle branch block pattern, 2 mm ST elevation in V3 through V5. The ECG from 08/15/2014 was 100% ventricularly paced and therefore different.  Assessment & Plan by Problem:  Mr. Hooven is an 81 year old man with a history of chronic systolic and diastolic heart failure, paroxysmal atrial fibrillation, status post an aortic valve replacement, hypertension, chronic obstructive pulmonary disease with an FEV1 of 1.91, and previous history of TIAs who presents with the acute onset of shortness of breath. Although distinguishing dyspnea secondary to chronic obstructive pulmonary disease and acute on chronic heart failure can sometimes be difficult, this patient's particular course has clarified the diagnosis. He presented with acute on chronic systolic and diastolic heart failure. The reason this is the case is the fact that he responded well symptomatically to IV diuresis and within a few hours of BiPAP therapy was feeling much improved. The reason  why this is not a chronic obstructive pulmonary disease exacerbation is that with an FEV1 of 1.91, even requiring acute BiPAP, were it a COPD exacerbation, he would not have responded so quickly symptomatically within a matter of hours to the initiation of BiPAP therapy in the setting of diuresis therapy. So although on presentation the diagnosis could be unclear, his response to condition specific therapy has clarified the diagnosis. That being said, it is unclear why he had an acute exacerbation. He had no intercurrent illness that we could discern. He reportedly has been compliant with both his medications and diet. There  may have been a slight laxity with his fluid restriction, but unlikely to the degree to cause the need for BiPAP therapy and a 2.2 L diuresis to return to baseline. He has a history of chronic kidney disease but it remains in the lower portion of stage III. Therefore he may benefit from Ace inhibitors for both his heart failure and his chronic kidney disease. That being said, it is unlikely that he would benefit much from Ace inhibitors if he were to have stage IV chronic kidney disease and he is close to that level. This is especially true given his previous history of hyperkalemia on one occasion while on an ACE inhibitor. Regardless, he has improved with intravenous diuresis and is close to baseline so as to be managed as an outpatient by the heart failure clinic.  1) Acute on chronic systolic and diastolic heart failure: We appreciate Dr. Alford Highland input and have returned him to his home oral medications now that we have diuresed him. In the future, should his renal function improve and he fall much more within the middle of the stage III chronic kidney disease range one could consider restarting the ACE inhibitor if he has no limitations from a potassium standpoint, noting he is also on spironolactone. If after stabilization his afterload and pulse permits escalation in his beta-blockade may also be reasonable. This will be deferred to the heart failure clinic and managed in the outpatient setting.  2) Chronic obstructive pulmonary disease: Not currently in an acute exacerbation. He will be maintained on his chronic medications for his obstructive lung disease.  3) Disposition: It is likely he will be discharged home tomorrow with follow-up in the heart failure clinic as well as with his pulmonologist for his chronic COPD.

## 2014-10-08 NOTE — Evaluation (Signed)
Occupational Therapy Evaluation Patient Details Name: Timothy Durham MRN: [id number] DOB: 1930-05-17 Today's Date: 10/08/2014    History of Present Illness Patient is an 81 y.o. male admitted with SOB, PMH positive for combined systolic and diastolic CHF (ECHO 12/2013 EF 76-81%), PAF, Aortic valve replacement, HTN, Stroke, COPD.   Clinical Impression   Pt admitted with above. Education provided in session and pt agreeable to OT signing off. Wife available to assist pt as needed. Pt planning to start pulmonary rehab soon.    Follow Up Recommendations  No OT follow up;Supervision - Intermittent    Equipment Recommendations  None recommended by OT    Recommendations for Other Services       Precautions / Restrictions Precautions Precautions: Fall Precaution Comments: right foot drop; watch O2 sats Restrictions Weight Bearing Restrictions: No      Mobility Bed Mobility      General bed mobility comments: not assessed  Transfers Overall transfer level: Independent      Balance Overall balance assessment: History of Falls; No physical assist needed for balance in session.                      ADL Overall ADL's : Needs assistance/impaired             Lower Body Bathing: Set up;Supervison/ safety (standing)       Lower Body Dressing: Supervision/safety;Set up;Sit to/from stand   Toilet Transfer: Supervision/safety;Ambulation (Independent with sit to stand from chair)       Tub/ Shower Transfer: Walk-in shower;Supervision/safety;Ambulation     General ADL Comments: Educated on energy conservation techniques. Educated on safety such as sitting for getting clothing on/over feet, safe footwear, rugs/items on floor, and recommended wife be nearby while pt is showering for safety.      Vision  Pt wears glasses.   Perception     Praxis      Pertinent Vitals/Pain Pain Assessment: No/denies pain; Pt's O2 dropped to 86% on RA after walk in hallway.  Performed deep breathing and O2 trended up nicely to 90s. Placed pt back on O2 at end of session (around 2L of O2)     Hand Dominance Left   Extremity/Trunk Assessment Upper Extremity Assessment Upper Extremity Assessment: Overall WFL for tasks assessed   Lower Extremity Assessment Lower Extremity Assessment: Defer to PT evaluation RLE Deficits / Details: overall strength grossly 4-/5 except ankle DF 2-/5 and tight heel cord noted bilaterally RLE Sensation: decreased light touch (reports numbness/tingling)       Communication Communication Communication: No difficulties   Cognition Arousal/Alertness: Awake/alert Behavior During Therapy: WFL for tasks assessed/performed Overall Cognitive Status: Within Functional Limits for tasks assessed                     General Comments          Shoulder Instructions      Home Living Family/patient expects to be discharged to:: Private residence Living Arrangements: Spouse/significant other Available Help at Discharge: Family;Available 24 hours/day Type of Home: House Home Access: Level entry     Home Layout: One level     Bathroom Shower/Tub: Producer, television/film/video: Standard (counter close)     Home Equipment: Walker - 4 wheels          Prior Functioning/Environment Level of Independence: Independent        Comments: reports foot drop came on about 2 months ago, scheduled to see neurologist on Friday.  States ordered AFO on his own which has helped, but admits to falling in driveway (up incline) last week.     OT Diagnosis: Generalized weakness   OT Problem List:     OT Treatment/Interventions:      OT Goals(Current goals can be found in the care plan section)   OT Frequency:   Barriers to D/C:            Co-evaluation              End of Session Equipment Utilized During Treatment: Gait belt;Oxygen (O2 for part of session) Nurse Communication: Other (comment) (O2  sats)  Activity Tolerance: Patient tolerated treatment well Patient left: in chair;with call bell/phone within reach   Time: 1540-1557 OT Time Calculation (min): 17 min Charges:  OT General Charges $OT Visit: 1 Procedure OT Evaluation $Initial OT Evaluation Tier I: 1 Procedure G-CodesEarlie Raveling OTR/L [id number] 10/08/2014, 4:56 PM

## 2014-10-08 NOTE — Care Management Note (Signed)
Case Management Note  Patient Details  Name: Timothy Durham MRN: [id number] Date of Birth: [birth year]  Subjective/Objective:      Adm w shortness of breath              Action/Plan: lives w wife, pcp dr y Everrett Coombe  Expected Discharge Date:                  Expected Discharge Plan:     In-House Referral:     Discharge planning Services     Post Acute Care Choice:    Choice offered to:     DME Arranged:    DME Agency:     HH Arranged:    HH Agency:     Status of Service:     Medicare Important Message Given:    Date Medicare IM Given:    Medicare IM give by:    Date Additional Medicare IM Given:    Additional Medicare Important Message give by:     If discussed at Long Length of Stay Meetings, dates discussed:    Additional Comments: ur review done.  Hanley Hays, RN 10/08/2014, 8:44 AM

## 2014-10-08 NOTE — Progress Notes (Signed)
Subjective: Patient did well overnight on 2L O2. Denies any SOB, wheezing cough. No complaints.   Objective: Vital signs in last 24 hours: Filed Vitals:   10/08/14 0000 10/08/14 0300 10/08/14 0400 10/08/14 0725  BP: 132/34  121/66 122/47  Pulse: 64 81 84 86  Temp:  97.6 F (36.4 C)  97.3 F (36.3 C)  TempSrc:  Oral  Oral  Resp: 16 11 16 26   Height:      Weight:  67.8 kg (149 lb 7.6 oz)    SpO2: 95% 96% 92% 96%   Weight change:   Intake/Output Summary (Last 24 hours) at 10/08/14 1144 Last data filed at 10/08/14 0900  Gross per 24 hour  Intake    490 ml  Output   2225 ml  Net  -1735 ml   Physical Exam GENERAL- alert, co-operative, appears as stated age, not in any distress. CARDIAC- RRR, no murmurs, rubs or gallops. RESP- Moving equal volumes of air, and clear to auscultation bilaterally, no wheezes bibassilar crackles. ABDOMEN- Soft, nontender, no guarding or rebound, no palpable masses or organomegaly, bowel sounds present. EXTREMITIES- pulse 1+, symmetric, no pedal edema. SKIN- Warm, dry, No rash or lesion. PSYCH- Normal mood and affect, appropriate thought content and speech.  Lab Results: Basic Metabolic Panel:  Recent Labs Lab 10/07/14 1300 10/08/14 0230  NA 134* 139  K 4.5 4.1  CL 96* 97*  CO2 26 32  GLUCOSE 163* 124*  BUN 33* 33*  CREATININE 1.91* 1.95*  CALCIUM 8.9 9.2   Liver Function Tests:  Recent Labs Lab 10/07/14 1300  AST 19  ALT 12*  ALKPHOS 69  BILITOT 0.7  PROT 6.6  ALBUMIN 3.9   CBC:  Recent Labs Lab 10/06/14 0130 10/07/14 1300 10/08/14 0230  WBC 8.3 15.6* 8.5  NEUTROABS 6.7 14.8*  --   HGB 10.2* 11.3* 9.9*  HCT 31.8* 35.0* 30.1*  MCV 94.4 92.6 91.5  PLT 247 294 253   Cardiac Enzymes:  Recent Labs Lab 10/07/14 1300 10/08/14 0937  TROPONINI 0.03 0.04*   Coagulation:  Recent Labs Lab 10/06/14 0130 10/07/14 1300 10/08/14 0230  LABPROT 26.5* 28.3* 26.6*  INR 2.48* 2.71* 2.48*   Urine Drug Screen: Drugs  of Abuse     Component Value Date/Time   LABOPIA NONE DETECTED 03/26/2013 1408   COCAINSCRNUR NONE DETECTED 03/26/2013 1408   LABBENZ NONE DETECTED 03/26/2013 1408   AMPHETMU NONE DETECTED 03/26/2013 1408   THCU NONE DETECTED 03/26/2013 1408   LABBARB NONE DETECTED 03/26/2013 1408   Urinalysis:  Recent Labs Lab 10/07/14 1531  COLORURINE YELLOW  LABSPEC 1.007  PHURINE 6.5  GLUCOSEU NEGATIVE  HGBUR NEGATIVE  BILIRUBINUR NEGATIVE  KETONESUR NEGATIVE  PROTEINUR NEGATIVE  UROBILINOGEN 0.2  NITRITE NEGATIVE  LEUKOCYTESUR NEGATIVE   Micro Results: Recent Results (from the past 240 hour(s))  MRSA PCR Screening     Status: None   Collection Time: 10/07/14  5:18 PM  Result Value Ref Range Status   MRSA by PCR NEGATIVE NEGATIVE Final    Comment:        The GeneXpert MRSA Assay (FDA approved for NASAL specimens only), is one component of a comprehensive MRSA colonization surveillance program. It is not intended to diagnose MRSA infection nor to guide or monitor treatment for MRSA infections.    Studies/Results: X-ray Chest Pa And Lateral  10/08/2014   CLINICAL DATA:  Shortness of breath, history of aortic valve replacement, CABG, permanent pacemaker placement, COPD with history of heavy smoking.  EXAM: CHEST  2 VIEW  COMPARISON:  Portable chest x-ray of October 07, 2014  FINDINGS: The lungs remain hyperinflated. The pulmonary interstitial markings have markedly improved. The cardiac silhouette is more normal in size today the prosthetic aortic valve ring is visible. The central pulmonary vascularity is less engorged. There remain small bilateral pleural effusions greater on the right than on the left. There 7 intact sternal wires. The permanent pacemaker is in reasonable position. The bony thorax exhibits no acute abnormality.  IMPRESSION: Improving CHF superimposed upon COPD.  There is no pneumonia.   Electronically Signed   By: David  Martinique M.D.   On: 10/08/2014 07:45   Dg Chest  Port 1 View  10/07/2014   CLINICAL DATA:  Shortness of breath.  EXAM: PORTABLE CHEST - 1 VIEW  COMPARISON:  October 06, 2014.  FINDINGS: Stable cardiomediastinal silhouette. Left-sided pacemaker is unchanged in position. Status post coronary artery bypass graft. No pneumothorax is noted. Stable small bilateral pleural effusions are noted. There appears to be increased left parahilar and right basilar interstitial opacities which may simply be due to radiographic technique, but increased pulmonary edema superimpose chronic scarring cannot be excluded. Rounded opacity seen in the region the right minor fissure most consistent with loculated fluid or pseudotumor. Bony thorax is intact.  IMPRESSION: Increased bilateral interstitial lung opacities are noted which may simply be due to radiographic technique, but increased pulmonary edema superimposed upon underlying chronic scarring cannot be excluded. Stable small bilateral pleural effusions are noted, with right greater than left. Stable small loculated effusion seen in right minor fissure.   Electronically Signed   By: Marijo Conception, M.D.   On: 10/07/2014 13:51   Medications: I have reviewed the patient's current medications. Scheduled Meds: . allopurinol  200 mg Oral Daily  . antiseptic oral rinse  7 mL Mouth Rinse q12n4p  . atorvastatin  40 mg Oral QPM  . carvedilol  6.25 mg Oral BID WC  . chlorhexidine  15 mL Mouth Rinse BID  . clopidogrel  75 mg Oral Daily  . DULoxetine  60 mg Oral Daily  . furosemide  40 mg Intravenous BID  . sodium chloride  3 mL Intravenous Q12H  . spironolactone  12.5 mg Oral Daily  . tamsulosin  0.8 mg Oral QHS  . warfarin  2 mg Oral q1800  . Warfarin - Pharmacist Dosing Inpatient   Does not apply q1800   Continuous Infusions:  PRN Meds:.acetaminophen **OR** acetaminophen, albuterol Assessment/Plan: Principal Problem:   Acute on chronic combined systolic and diastolic congestive heart failure Active Problems:    Hypertension   CKD (chronic kidney disease), stage III   Tissue AVR 2006 (in [place])   PAF   Hyperlipidemia   Amiodarone pulmonary toxicity   COPD (chronic obstructive pulmonary disease)  Acute on Chronic combined systolic and diastolic CHF: Pt did well on 2L O2 overnight. 2.2 L of output yesterday. No complaints of SOB, wheezing, cough. Troponin 0.04 today, up from 0.03 yesterday. Repeat CXR shows improving CHF, no evidence of pneumonia. No leukocytosis. Will continue to aggressively diuresis today and move to regular bed.  He was previously on lisinopril 5 mg but stopped in 02/2014 for hyperkalemia. Calculated survival per [place] Heart Failure Model improved by 10% at 5 year survival with addition of ACE-I. HF team does not recommend starting an ACE-I or ARB in the setting of CKD.  - Switch to home Torsemide regimen per HF team - Continue Coreg 6.25 mg BID - Continue  Spironolactone 12.5 mg qdaily - Continue Clopidogrel 75 mg qdaily  - Continue Telemetry with aggressive diuresis - Check BMP in AM  COPD: No acute COPD exacerbation. SOB 2/2 CHF exacerbation. Former smoker, O2 sat 92-96% on 2L. - albuterol nebs prn  - BiPAP prn   HTN: BP has been in the 130-140s. Will continue home medications.  - See above  CKD (Chronic Kidney Disease) Stage III: eGFR 31, Cr 1.91, BUN 33 on admission. Cr stable at 1.95 today.  - ContinueLasix IV 40 mg BID - Continue home spironolactone 12.5 mg qdaily - Allopurinol 246m qdaily (hx of gout)  PAF: INR 2.48 today. S/p bioprosthetic aortic valve replacement in 2006. - Continue warfarin 2 mg qdaily - Continue clopidogrel 75 mg qdaily  - Per pharmacy  HLD: On atorvastain 40 mg qdaily.  - Continue home meds  Depression/Anxiety: On Duloxetine 60 mg daily. - Continue home meds  DVT PPX: warfarin per pharmacy  Code status: DNR  Dispo: Disposition is deferred at this time, awaiting improvement of current medical problems.  Anticipated discharge in  approximately 1-3 day(s).   The patient does have a current PCP (Marciano SequinM. CKarle Starch MD) and [facility].  The patient does not have transportation limitations that hinder transportation to clinic appointments.   LOS: 1 day   NMaryellen Pile MD 10/08/2014, 11:44 AM

## 2014-10-08 NOTE — Patient Instructions (Signed)
Fall Prevention and Home Safety Falls cause injuries and can affect all age groups. It is possible to prevent falls.  HOW TO PREVENT FALLS  Wear shoes with rubber soles that do not have an opening for your toes.  Keep the inside and outside of your house well lit.  Use night lights throughout your home.  Remove clutter from floors.  Clean up floor spills.  Remove throw rugs or fasten them to the floor with carpet tape.  Do not place electrical cords across pathways.  Put grab bars by your tub, shower, and toilet. Do not use towel bars as grab bars.  Put handrails on both sides of the stairway. Fix loose handrails.  Do not climb on stools or stepladders, if possible.  Do not wax your floors.  Repair uneven or unsafe sidewalks, walkways, or stairs.  Keep items you use a lot within reach.  Be aware of pets.  Keep emergency numbers next to the telephone.  Put smoke detectors in your home and near bedrooms. Ask your doctor what other things you can do to prevent falls. Document Released: 01/08/2009 Document Revised: 09/13/2011 Document Reviewed: 06/14/2011 [facility] Patient Information 2015 [place], [place]. This information is not intended to replace advice given to you by your health care provider. Make sure you discuss any questions you have with your health care provider. ANKLE: Dorsiflexion - Sitting   Sitting, place strap around foot. Pull foot toward body, keeping heel on floor. Keep foot AND KNEE straight. Hold _30__ seconds. _3__ reps per set, __2_ sets per day, _5__ days per week  Copyright  VHI. All rights reserved.

## 2014-10-08 NOTE — Progress Notes (Signed)
ANTICOAGULATION CONSULT NOTE - Follow Up Consult  Pharmacy Consult for Warfarin Indication: atrial fibrillation  Allergies  Allergen Reactions  . Ambien [Zolpidem] Other (See Comments)    Hallucinations  . Ativan [Lorazepam] Other (See Comments)    hallucinations  . Oxycontin [Oxycodone] Other (See Comments)    hallucinations  . Amiodarone   . Penicillins     Unknown childhood reaction   . Sulfa Antibiotics     unknown    Patient Measurements: Height:  (177.8 cm) Weight: 149 lb 7.6 oz (67.8 kg) IBW/kg (Calculated) : 73 Heparin Dosing Weight:   Vital Signs: Temp: 97.3 F (36.3 C) (07/13 0725) Temp Source: Oral (07/13 0725) BP: 122/47 mmHg (07/13 0725) Pulse Rate: 86 (07/13 0725)  Labs:  Recent Labs  10/06/14 0130 10/07/14 1300 10/08/14 0230  HGB 10.2* 11.3* 9.9*  HCT 31.8* 35.0* 30.1*  PLT 247 294 253  LABPROT 26.5* 28.3* 26.6*  INR 2.48* 2.71* 2.48*  CREATININE 1.99* 1.91* 1.95*  TROPONINI  --  0.03  --     Estimated Creatinine Clearance: 28.5 mL/min (by C-G formula based on Cr of 1.95).   Medical History: Past Medical History  Diagnosis Date  . Arthritis   . Non-obstructive CAD     a. 12/2013 NSTEMI/Cath: LM nl, LAD 20, LCX 40-50, RCA 20.  Marland Kitchen Hypertension   . Stroke     a. July 2000;  b. January 2007;  c. TIA in 2014.  . CKD (chronic kidney disease), stage III   . GERD (gastroesophageal reflux disease)   . Foot pain, bilateral   . H/O hematuria   . Hyperlipidemia   . Vitamin D deficiency   . Chronic combined systolic and diastolic CHF (congestive heart failure)     a. 12/2013 Echo: EF 30-35%, mod conc LVH, Gr 3 DD, mild AI, mod-sev MR, mod dil LA, mild TR.  Marland Kitchen PAF (paroxysmal atrial fibrillation)     a. CHA2DS2VASc = 7--> Chronic Coumadin.  Marland Kitchen NICM (nonischemic cardiomyopathy)     a. 12/2013 Echo: EF 30-35%.  . Aortic valve prosthesis present     a. 2006: S/P bioprosthetic AVR.  Marland Kitchen PAD (peripheral artery disease)     a. s/p LLE stenting.   . Carotid arterial disease     a. s/p R CEA  . History of tobacco abuse   . Moderate to Severe Mitral Regurgitation     a. 12/2013 Echo: Mod-Sev MR.  Marland Kitchen Anxiety   . COPD (chronic obstructive pulmonary disease)   . Coronary arteriosclerosis Oct 2015    med Rx  . Shortness of breath dyspnea   . Depression   . Presence of permanent cardiac pacemaker   . Heart murmur   . Dysrhythmia     Medications:  Prescriptions prior to admission  Medication Sig Dispense Refill Last Dose  . acetaminophen (TYLENOL) 500 MG tablet Take 1,000 mg by mouth at bedtime.    10/06/2014 at Unknown time  . allopurinol (ZYLOPRIM) 100 MG tablet Take 200 mg by mouth daily.   10/07/2014 at Unknown time  . atorvastatin (LIPITOR) 40 MG tablet Take 40 mg by mouth every evening.   10/06/2014 at Unknown time  . Calcium Carb-Cholecalciferol (CALCIUM 600 + D PO) Take 2 tablets by mouth every morning.   10/06/2014 at Unknown time  . carvedilol (COREG) 6.25 MG tablet Take 1 tablet (6.25 mg total) by mouth 2 (two) times daily with a meal. 60 tablet 3 10/07/2014 at 800  . clopidogrel (PLAVIX) 75  MG tablet Take 75 mg by mouth daily.   10/07/2014 at Unknown time  . DULoxetine (CYMBALTA) 60 MG capsule Take 60 mg by mouth daily.    10/07/2014 at Unknown time  . Omega-3 Fatty Acids (FISH OIL) 1200 MG CAPS Take 1 capsule by mouth every morning.   10/07/2014 at Unknown time  . omeprazole (PRILOSEC) 40 MG capsule Take 40 mg by mouth every morning.   10/07/2014 at Unknown time  . potassium chloride SA (K-DUR,KLOR-CON) 20 MEQ tablet Take 1 tablet (20 mEq total) by mouth as needed. TAKE ONLY WHEN YOU TAKE METOLAZONE 30 tablet 3 months ago  . predniSONE (DELTASONE) 50 MG tablet One tablet PO daily for 4 days (Patient taking differently: Take 50 mg by mouth daily with breakfast. One tablet PO daily for 4 days) 4 tablet 0 10/07/2014 at Unknown time  . spironolactone (ALDACTONE) 25 MG tablet Take 0.5 tablets (12.5 mg total) by mouth daily. 15 tablet 3  10/07/2014 at Unknown time  . tamsulosin (FLOMAX) 0.4 MG CAPS capsule Take 2 capsules (0.8 mg total) by mouth at bedtime. 60 capsule 0 10/06/2014 at Unknown time  . torsemide (DEMADEX) 20 MG tablet Take 2 tablets (40 mg total) by mouth 2 (two) times daily. 56 tablet 3 10/07/2014 at Unknown time  . warfarin (COUMADIN) 2 MG tablet Take 2 mg by mouth daily.   10/06/2014 at Unknown time   Scheduled:  . allopurinol  200 mg Oral Daily  . antiseptic oral rinse  7 mL Mouth Rinse q12n4p  . atorvastatin  40 mg Oral QPM  . carvedilol  6.25 mg Oral BID WC  . chlorhexidine  15 mL Mouth Rinse BID  . clopidogrel  75 mg Oral Daily  . DULoxetine  60 mg Oral Daily  . furosemide  40 mg Intravenous BID  . sodium chloride  3 mL Intravenous Q12H  . spironolactone  12.5 mg Oral Daily  . tamsulosin  0.8 mg Oral QHS  . warfarin  2 mg Oral q1800  . Warfarin - Pharmacist Dosing Inpatient   Does not apply q1800   Infusions:    Assessment: 81yo male with history of Afib, HTN, CKD3, and NICM presents with SOB. Pharmacy is consulted to dose warfarin for atrial fibrillation. INR 2.48,CBC stable, sCr 1.9.  PTA Warfarin Dose: 2mg /d with last dose 10/06/14  Goal of Therapy:  INR 2-3 Monitor platelets by anticoagulation protocol: Yes   Plan:  Warfarin 2mg  daily  Daily INR/CBC Monitor s/sx of bleeding  Leota Sauers Pharm.D. CPP, BCPS Clinical Pharmacist [phone] 10/08/2014 8:23 AM

## 2014-10-08 NOTE — Progress Notes (Signed)
Transferred to 3 east room30 by wheelchair, stable, report given to RN, belongings with pt.

## 2014-10-08 NOTE — Consult Note (Signed)
Advanced Heart Failure Team Consult Note  Referring Physician: Josem Kaufmann Primary Physician: Diana Eves. Luiz Iron, MD Primary Cardiologist:  Ladona Ridgel  Reason for Consultation: Acute on Chronic HF  HPI:    Timothy Durham is 81 y.o. male with h/o of Combined systolic/diastolic HF, ECHO 5/16 EF 25-30%, Grade 2 DD, PAF on chronic coumadin, s/p AVR (10/10), HTN, Hx of Stroke and TIA (x 3 .'00, '07, '14), CKD stage 3, s/p Bivi pacemaker 11/15.   He presented to [facility] ED on 10/06/14 with c/o SOB.  It was of an acute onset and accompanied by a minimally productive cough with thick yellow mucus.  CXR in ED showed fluid in lungs. He was treated for acute on chronic COPD and given prednisone course and albuterol prn.  He began experiencing SOB again on 10/07/14 with no relief from oxygen at home.  He had pronounced crackles at home and called EMS and this time came to [facility] where he was admitted.   He says he is feeling much better today.  He denies any further SOB, he says he has not needed the albuterol treatments.  Has limited mobility currently, but denies dizziness/lightheadness when sitting up.  No CP/Palpitations. Denies edema.  He says he was taking all of his medications as directed, without missing any doses.  Denies salt indiscretion, but says he had not been paying attention to how much water he had been drinking 2/2 to the heat and humidity outside.  He denies having fever or chills at home.  Review of Systems: [y] = yes, [ ]  = no   General: Weight gain [ ] ; Weight loss [ ] ; Anorexia [ ] ; Fatigue [ ] ; Fever [ ] ; Chills [ ] ; Weakness [ ]   Cardiac: Chest pain/pressure [ ] ; Resting SOB [ ] ; Exertional SOB [ ] ; Orthopnea [ ] ; Pedal Edema [ ] ; Palpitations [ ] ; Syncope [ ] ; Presyncope [ ] ; Paroxysmal nocturnal dyspnea[ ]   Pulmonary: Cough [y]; Wheezing[ ] ; Hemoptysis[ ] ; Sputum [y]; Snoring [ ]   GI: Vomiting[ ] ; Dysphagia[ ] ; Melena[ ] ; Hematochezia [ ] ; Heartburn[ ] ; Abdominal pain [ ] ; Constipation [  ]; Diarrhea [ ] ; BRBPR [ ]   GU: Hematuria[ ] ; Dysuria [ ] ; Nocturia[ ]   Vascular: Pain in legs with walking [ ] ; Pain in feet with lying flat [ ] ; Non-healing sores [ ] ; Stroke [ ] ; TIA [ ] ; Slurred speech [ ] ;  Neuro: Headaches[ ] ; Vertigo[ ] ; Seizures[ ] ; Paresthesias[ ] ;Blurred vision [ ] ; Diplopia [ ] ; Vision changes [ ]   Ortho/Skin: Arthritis [ ] ; Joint pain [ ] ; Muscle pain [ ] ; Joint swelling [ ] ; Back Pain [ ] ; Rash [ ]   Psych: Depression[ ] ; Anxiety[ ]   Heme: Bleeding problems [ ] ; Clotting disorders [ ] ; Anemia [ ]   Endocrine: Diabetes [ ] ; Thyroid dysfunction[ ]   Home Medications Prior to Admission medications   Medication Sig Start Date End Date Taking? Authorizing Provider  acetaminophen (TYLENOL) 500 MG tablet Take 1,000 mg by mouth at bedtime.    Yes Historical Provider, MD  allopurinol (ZYLOPRIM) 100 MG tablet Take 200 mg by mouth daily.   Yes Historical Provider, MD  atorvastatin (LIPITOR) 40 MG tablet Take 40 mg by mouth every evening.   Yes Historical Provider, MD  Calcium Carb-Cholecalciferol (CALCIUM 600 + D PO) Take 2 tablets by mouth every morning.   Yes Historical Provider, MD  carvedilol (COREG) 6.25 MG tablet Take 1 tablet (6.25 mg total) by mouth 2 (two) times daily with a meal. 09/12/14  Yes Laurey Morale, MD  clopidogrel (PLAVIX) 75 MG tablet Take 75 mg by mouth daily.   Yes Historical Provider, MD  DULoxetine (CYMBALTA) 60 MG capsule Take 60 mg by mouth daily.  03/18/14  Yes Historical Provider, MD  Omega-3 Fatty Acids (FISH OIL) 1200 MG CAPS Take 1 capsule by mouth every morning.   Yes Historical Provider, MD  omeprazole (PRILOSEC) 40 MG capsule Take 40 mg by mouth every morning.   Yes Historical Provider, MD  potassium chloride SA (K-DUR,KLOR-CON) 20 MEQ tablet Take 1 tablet (20 mEq total) by mouth as needed. TAKE ONLY WHEN YOU TAKE METOLAZONE 04/10/14  Yes Dolores Patty, MD  predniSONE (DELTASONE) 50 MG tablet One tablet PO daily for 4 days Patient taking  differently: Take 50 mg by mouth daily with breakfast. One tablet PO daily for 4 days 10/06/14  Yes Zadie Rhine, MD  spironolactone (ALDACTONE) 25 MG tablet Take 0.5 tablets (12.5 mg total) by mouth daily. 08/22/14  Yes Laurey Morale, MD  tamsulosin (FLOMAX) 0.4 MG CAPS capsule Take 2 capsules (0.8 mg total) by mouth at bedtime. 08/07/14  Yes Penny Pia, MD  torsemide (DEMADEX) 20 MG tablet Take 2 tablets (40 mg total) by mouth 2 (two) times daily. 10/03/14  Yes Dolores Patty, MD  warfarin (COUMADIN) 2 MG tablet Take 2 mg by mouth daily.   Yes Historical Provider, MD    Past Medical History: Past Medical History  Diagnosis Date  . Arthritis   . Non-obstructive CAD     a. 12/2013 NSTEMI/Cath: LM nl, LAD 20, LCX 40-50, RCA 20.  Marland Kitchen Hypertension   . Stroke     a. July 2000;  b. January 2007;  c. TIA in 2014.  . CKD (chronic kidney disease), stage III   . GERD (gastroesophageal reflux disease)   . Foot pain, bilateral   . H/O hematuria   . Hyperlipidemia   . Vitamin D deficiency   . Chronic combined systolic and diastolic CHF (congestive heart failure)     a. 12/2013 Echo: EF 30-35%, mod conc LVH, Gr 3 DD, mild AI, mod-sev MR, mod dil LA, mild TR.  Marland Kitchen PAF (paroxysmal atrial fibrillation)     a. CHA2DS2VASc = 7--> Chronic Coumadin.  Marland Kitchen NICM (nonischemic cardiomyopathy)     a. 12/2013 Echo: EF 30-35%.  . Aortic valve prosthesis present     a. 2006: S/P bioprosthetic AVR.  Marland Kitchen PAD (peripheral artery disease)     a. s/p LLE stenting.  . Carotid arterial disease     a. s/p R CEA  . History of tobacco abuse   . Moderate to Severe Mitral Regurgitation     a. 12/2013 Echo: Mod-Sev MR.  Marland Kitchen Anxiety   . COPD (chronic obstructive pulmonary disease)   . Coronary arteriosclerosis Oct 2015    med Rx  . Shortness of breath dyspnea   . Depression   . Presence of permanent cardiac pacemaker   . Heart murmur   . Dysrhythmia     Past Surgical History: Past Surgical History  Procedure  Laterality Date  . Aortic valve replacement (avr)/coronary artery bypass grafting (cabg)  12/2008  . Eye surgery    . Carotid angiogram  1996  . Left lower extremity stent    . Carotid endarterectomy    . Insert / replace / remove pacemaker    . Coronary angiogram  Oct 2015  . Pacemaker insertion  Nov 2015    MDT BiV  . Left  and right heart catheterization with coronary angiogram N/A 01/14/2014    Procedure: LEFT AND RIGHT HEART CATHETERIZATION WITH CORONARY ANGIOGRAM;  Surgeon: Peter M Swaziland, MD;  Location: [facility] CATH LAB;  Service: Cardiovascular;  Laterality: N/A;  . Bi-ventricular pacemaker insertion N/A 02/17/2014    Procedure: BI-VENTRICULAR PACEMAKER INSERTION (CRT-P);  Surgeon: Marinus Maw, MD;  Location: [facility] CATH LAB;  Service: Cardiovascular;  Laterality: N/A;  . Coronary artery bypass graft    . Coronary angioplasty    . Tonsillectomy      Family History: Family History  Problem Relation Age of Onset  . Stroke Mother   . Cancer Father     Social History: History   Social History  . Marital Status: Married    Spouse Name: N/A  . Number of Children: N/A  . Years of Education: N/A   Social History Main Topics  . Smoking status: Former Smoker -- 2.00 packs/day for 35 years    Types: Cigarettes    Quit date: 09/26/1998  . Smokeless tobacco: Never Used  . Alcohol Use: 0.6 oz/week    1 Cans of beer per week     Comment: drinks one can of beer or wine daily.  . Drug Use: No  . Sexual Activity: Not on file   Other Topics Concern  . None   Social History Narrative   -lives with wife in [place] (09/2014)    Allergies:  Allergies  Allergen Reactions  . Ambien [Zolpidem] Other (See Comments)    Hallucinations  . Ativan [Lorazepam] Other (See Comments)    hallucinations  . Oxycontin [Oxycodone] Other (See Comments)    hallucinations  . Amiodarone   . Penicillins     Unknown childhood reaction   . Sulfa Antibiotics     unknown    Objective:     Vital Signs:   Temp:  [97.3 F (36.3 C)-98.2 F (36.8 C)] 97.3 F (36.3 C) (07/13 0725) Pulse Rate:  [64-101] 86 (07/13 0725) Resp:  [11-36] 26 (07/13 0725) BP: (121-148)/(34-78) 122/47 mmHg (07/13 0725) SpO2:  [92 %-100 %] 96 % (07/13 0725) FiO2 (%):  [40 %] 40 % (07/12 1305) Weight:  [149 lb 7.6 oz (67.8 kg)-152 lb 5.4 oz (69.1 kg)] 149 lb 7.6 oz (67.8 kg) (07/13 0300) Last BM Date: 10/07/14  Weight change: Filed Weights   10/07/14 1719 10/08/14 0300  Weight: 152 lb 5.4 oz (69.1 kg) 149 lb 7.6 oz (67.8 kg)    Intake/Output:   Intake/Output Summary (Last 24 hours) at 10/08/14 0732 Last data filed at 10/08/14 0400  Gross per 24 hour  Intake    240 ml  Output   2225 ml  Net  -1985 ml     Physical Exam: General:  Well appearing. No resp difficulty HEENT: normal Neck: supple. JVP flat . Carotids 2+ bilat; no bruits. No lymphadenopathy or thryomegaly appreciated. Cor: PMI nondisplaced. Regular rate & rhythm. No rubs, gallops. 2/6 MR murmur Lungs: Diminished bibasilarly with crackles to lower and middle lobes.  Rhoncus sounds that clear with cough. Abdomen: soft, nontender, nondistended. No hepatosplenomegaly. No bruits or masses. + bowel sounds. Extremities: no cyanosis, clubbing, rash, edema Neuro: alert & orientedx3, cranial nerves grossly intact. moves all 4 extremities w/o difficulty. Affect pleasant  Telemetry:  Paced rhythm in 70s with occasional PVCs.  Labs: Basic Metabolic Panel:  Recent Labs Lab 10/06/14 0130 10/07/14 1300 10/08/14 0230  NA 135 134* 139  K 3.8 4.5 4.1  CL 95* 96* 97*  CO2 29 26 32  GLUCOSE 107* 163* 124*  BUN 39* 33* 33*  CREATININE 1.99* 1.91* 1.95*  CALCIUM 8.7* 8.9 9.2    Liver Function Tests:  Recent Labs Lab 10/07/14 1300  AST 19  ALT 12*  ALKPHOS 69  BILITOT 0.7  PROT 6.6  ALBUMIN 3.9   No results for input(s): LIPASE, AMYLASE in the last 168 hours. No results for input(s): AMMONIA in the last 168  hours.  CBC:  Recent Labs Lab 10/06/14 0130 10/07/14 1300 10/08/14 0230  WBC 8.3 15.6* 8.5  NEUTROABS 6.7 14.8*  --   HGB 10.2* 11.3* 9.9*  HCT 31.8* 35.0* 30.1*  MCV 94.4 92.6 91.5  PLT 247 294 253    Cardiac Enzymes:  Recent Labs Lab 10/07/14 1300  TROPONINI 0.03    BNP: BNP (last 3 results)  Recent Labs  08/28/14 1640 10/06/14 0130 10/07/14 1300  BNP 1480.8* 971.4* 1758.5*    ProBNP (last 3 results)  Recent Labs  02/24/14 1606 02/26/14 1623 03/06/14 1129  PROBNP 2973.0* 18948.0* 5943.0*     CBG: No results for input(s): GLUCAP in the last 168 hours.  Coagulation Studies:  Recent Labs  10/06/14 0130 10/07/14 1300 10/08/14 0230  LABPROT 26.5* 28.3* 26.6*  INR 2.48* 2.71* 2.48*    Other results: - EKG 7/12 shows a-tach, with old anterior infract, old LBBB,  v-paced rhythm at 70 bpm  Imaging: Dg Chest Port 1 View  10/07/2014   CLINICAL DATA:  Shortness of breath.  EXAM: PORTABLE CHEST - 1 VIEW  COMPARISON:  October 06, 2014.  FINDINGS: Stable cardiomediastinal silhouette. Left-sided pacemaker is unchanged in position. Status post coronary artery bypass graft. No pneumothorax is noted. Stable small bilateral pleural effusions are noted. There appears to be increased left parahilar and right basilar interstitial opacities which may simply be due to radiographic technique, but increased pulmonary edema superimpose chronic scarring cannot be excluded. Rounded opacity seen in the region the right minor fissure most consistent with loculated fluid or pseudotumor. Bony thorax is intact.  IMPRESSION: Increased bilateral interstitial lung opacities are noted which may simply be due to radiographic technique, but increased pulmonary edema superimposed upon underlying chronic scarring cannot be excluded. Stable small bilateral pleural effusions are noted, with right greater than left. Stable small loculated effusion seen in right minor fissure.   Electronically  Signed   By: Lupita Raider, M.D.   On: 10/07/2014 13:51      Medications:     Current Medications: . allopurinol  200 mg Oral Daily  . antiseptic oral rinse  7 mL Mouth Rinse q12n4p  . atorvastatin  40 mg Oral QPM  . carvedilol  6.25 mg Oral BID WC  . chlorhexidine  15 mL Mouth Rinse BID  . clopidogrel  75 mg Oral Daily  . DULoxetine  60 mg Oral Daily  . furosemide  40 mg Intravenous BID  . sodium chloride  3 mL Intravenous Q12H  . spironolactone  12.5 mg Oral Daily  . tamsulosin  0.8 mg Oral QHS  . Warfarin - Pharmacist Dosing Inpatient   Does not apply q1800     Infusions:      Assessment/Plan   1. Acute on Chronic Combined CHF, ECHO 5/16 EF 25-30%, Grade 2 DD - Volume status appears stable/not elevated. - NICM via cath 12/2013 - Out 1.9L and down 3 lbs since admission on IV lasix. - Currently on 40 mg IV lasix BID.  40 mg of Torsemide BID is  chronic home dose. - Continue Coreg and spiro - No ACE/ARB in CKD - Recent Echo 5/16 - CXR shows possible increased edema with chronic scarring.  Small stable bilaterally pleural effusions - K 4.1 today. - s/p Bivi Pacemaker - Hydralazine/Imdur were held previously to promote higher BP in setting of TIAs. 2. COPD - PFTs on 09/18/14. FVC Pre 2.96, 73%; FEV1 Pre 1.91, 66%; TLC 5.33, 74%; DLCO 14.59, 44%. - Moderate obstruction, Mild restriction 3. AKI on CKD, Stage 3 - Baseline past 6 months 1.6.   - 1.9 currently.  Will follow closely with diuresis. 4. PAF  - On chronic coumadin, dosing per pharm - He is in NSR today.  - INR 2.48 today 5. Hx of Stroke/TIA - On Coumadin as above - On Plavix and statin as well 6. HTN - On Coreg and spiro - Hydralazine/Imdur were held previously to promote higher BP in setting of TIAs. 7. HLD - atorvastatin  Diuresed well and does not appear fluid overloaded on exam. ?more COPD exacerbation vs HF.  Length of Stay: 1  Graciella Freer PA-C 10/08/2014, 7:32 AM  Advanced Heart  Failure Team Pager [phone] (M-F; 7a - 4p)  Please contact [facility] for night-coverage after hours (4p -7a ) and weekends on [email or web address]  Patient seen with PA, agree with the above note.   1. 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  Has [employer] CRT-P system.  CXR with pulmonary edema and BNP elevated.  He diuresed well yesterday on IV Lasix.  On exam today, he does not appear markedly volume overloaded.  - Reasonable to give 1 more dose of IV Lasix, would then switch back to torsemide.  He has been on torsemide 40 mg bid at home, would use 60 qam/40 qpm now.  - Continue current Coreg and spironolactone.  2. CKD: Mild rise in creatinine, watch closely.  As above, suspect we can switch back to po torsemide after IV Lasix dose this morning.  3. H/o amiodarone lung toxicity: With restrictive component to PFTs.  Off amiodarone now.  4. COPD: ?Component of COPD exacerbation, had moderate obstruction on last PFTs.  Per primary service.  5. H/o TIAs/CVAs: Need to continue Plavix in addition to warfarin.  6. Atrial fibrillation: Paroxysmal.  In NSR today.  Off amiodarone with history of toxicity.  Continue warfarin.  7. Bioprosthetic aortic valve: Stable last echo.   Marca Ancona 10/08/2014 9:39 AM

## 2014-10-08 NOTE — Evaluation (Signed)
Physical Therapy Evaluation Patient Details Name: Timothy Durham MRN: [id number] DOB: Mar 11, 1931 Today's Date: 10/08/2014   History of Present Illness  Patient is an 81 y/o male admitted with SOB, PMH positive for combined systolic and diastolic CHF (ECHO 12/2013 EF 77-82%), PAF, Aortic valve replacement, HTN, Stroke, COPD.  Clinical Impression  Patient presents with decreased mobility and safety due to deficits listed in PT problem list below and will benefit from skilled PT in the acute setting to allow return home with family assist.  Feel pulmonary rehab can address generalized weakness.  Patient scheduled to see neurologist on Friday due to foot drop and reports has already bought an AFO.  No follow up PT recommended at this time.  Will follow acutely for further training/education due to fall risk.    Follow Up Recommendations No PT follow up;Other (comment) (patient reports scheduled to start pulmonary rehab in couple of weeks)    Equipment Recommendations  Other (comment) (may need cane, TBA)    Recommendations for Other Services       Precautions / Restrictions Precautions Precautions: Fall Precaution Comments: right foot drop      Mobility  Bed Mobility Overal bed mobility: Modified Independent                Transfers Overall transfer level: Needs assistance   Transfers: Sit to/from Stand Sit to Stand: Supervision         General transfer comment: for safety  Ambulation/Gait Ambulation/Gait assistance: Min guard;Supervision Ambulation Distance (Feet): 400 Feet   Gait Pattern/deviations: Steppage;Step-through pattern;Decreased stride length;Narrow base of support     General Gait Details: instability noted in addition to steppage due to right foot drop; pt reports wears shoe lift in left shoe due to LLD since CVA  Stairs Stairs: Yes Stairs assistance: Min assist Stair Management: Alternating pattern;One rail Left Number of Stairs: 4 General stair  comments: loss of balance caught left foot on stair ascending, min assist to recover/prevent falling, second trial no loss of balance; educated to perform step to technique lead up with left, but pt continues with step through pattern.  Wheelchair Mobility    Modified Rankin (Stroke Patients Only)       Balance Overall balance assessment: History of Falls;Needs assistance   Sitting balance-Leahy Scale: Good       Standing balance-Leahy Scale: Fair                   Standardized Balance Assessment Standardized Balance Assessment : Dynamic Gait Index   Dynamic Gait Index Level Surface: Moderate Impairment Change in Gait Speed: Mild Impairment Gait with Horizontal Head Turns: Normal Gait with Vertical Head Turns: Normal Gait and Pivot Turn: Severe Impairment Step Over Obstacle: Mild Impairment Step Around Obstacles: Normal Steps: Mild Impairment Total Score: 16       Pertinent Vitals/Pain Pain Assessment: No/denies pain    Home Living Family/patient expects to be discharged to:: Private residence Living Arrangements: Spouse/significant other Available Help at Discharge: Family;Available 24 hours/day Type of Home: House Home Access: Level entry     Home Layout: One level Home Equipment: Walker - 4 wheels      Prior Function Level of Independence: Independent         Comments: reports foot drop came on about 2 months ago, scheduled to see neurologist on Friday.  States ordered AFO on his own which has helped, but admits to falling in driveway (up incline) last week.      Hand Dominance  Extremity/Trunk Assessment               Lower Extremity Assessment: RLE deficits/detail RLE Deficits / Details: overall strength grossly 4-/5 except ankle DF 2-/5 and tight heel cord noted bilaterally       Communication   Communication: No difficulties  Cognition Arousal/Alertness: Awake/alert Behavior During Therapy: WFL for tasks  assessed/performed Overall Cognitive Status: Within Functional Limits for tasks assessed                      General Comments General comments (skin integrity, edema, etc.): Patient educated in fall prevention and handout given.  Admits to not wearing shoes in the home.  Also states gets up on ladder or step ladder routinely about once a month.  Educated not safe to do so.    Exercises Other Exercises Other Exercises: Educated in heel cord stretch with strap hold x 30 seconds, patient performed x 1 each foot, issued handout for HEP      Assessment/Plan    PT Assessment Patient needs continued PT services  PT Diagnosis Abnormality of gait;Generalized weakness   PT Problem List Decreased strength;Decreased balance;Decreased safety awareness;Decreased mobility;Decreased knowledge of use of DME  PT Treatment Interventions DME instruction;Balance training;Gait training;Functional mobility training;Patient/family education;Therapeutic activities;Therapeutic exercise   PT Goals (Current goals can be found in the Care Plan section) Acute Rehab PT Goals Patient Stated Goal: To return home; start pulmonary rehab in couple of weeks PT Goal Formulation: With patient Time For Goal Achievement: 10/15/14 Potential to Achieve Goals: Good    Frequency Min 3X/week   Barriers to discharge        Co-evaluation               End of Session Equipment Utilized During Treatment: Gait belt Activity Tolerance: Patient tolerated treatment well Patient left: in chair;with call bell/phone within reach           Time: 1348-1430 PT Time Calculation (min) (ACUTE ONLY): 42 min   Charges:   PT Evaluation $Initial PT Evaluation Tier I: 1 Procedure PT Treatments $Gait Training: 8-22 mins $Self Care/Home Management: 8-22   PT G Codes:        WYNN,CYNDI October 27, 2014, 3:48 PM  Sheran Lawless, PT [phone] 2014-10-27

## 2014-10-08 NOTE — Progress Notes (Signed)
Subjective: Timothy Durham improving, stating he is having no difficulty in breathing overnight. No episodes of paroxysmal nocturnal dyspnea, using only 1 pillow during sleep.  Objective: Vital signs in last 24 hours: Filed Vitals:   10/08/14 0300 10/08/14 0400 10/08/14 0725 10/08/14 1030  BP:  121/66 122/47 114/37  Pulse: 81 84 86 76  Temp: 97.6 F (36.4 C)  97.3 F (36.3 C) 97.3 F (36.3 C)  TempSrc: Oral  Oral Oral  Resp: _0 Height:    _1  (1.778 m)  Weight: 67.8 kg (149 lb 7.6 oz)   67.813 kg (149 lb 8 oz)  SpO2: 96% 92% 96% 97%   Weight change:   Intake/Output Summary (Last 24 hours) at 10/08/14 1343 Last data filed at 10/08/14 0900  Gross per 24 hour  Intake    490 ml  Output   2225 ml  Net  -1735 ml   Physical Exam: General: alert & cooperative, in no acute distress HEENT: conjunctiva clear, supple neck CV: RRR, normal S1 and S2 with occasional extra beats, no murmurs, no JVD Chest: pacemaker in place with no erythema/edema Lung: crackles in lower lobes bilaterally, no increased work of breathing, no wheezing Abd: soft, nontender, normal bowel sounds, no masses Neuro: no obvious cranial nerve abnormalities, unchanged from yesterday Ext: normal, atraumatic, no cyanosis or edema in all four extremities  Lab Results: Results for orders placed or performed during the hospital encounter of 10/07/14 (from the past 24 hour(s))  Urinalysis, Routine w reflex microscopic (not at [facility])     Status: None   Collection Time: 10/07/14  3:31 PM  Result Value Ref Range   Color, Urine YELLOW YELLOW   APPearance CLEAR CLEAR   Specific Gravity, Urine 1.007 1.005 - 1.030   pH 6.5 5.0 - 8.0   Glucose, UA NEGATIVE NEGATIVE mg/dL   Hgb urine dipstick NEGATIVE NEGATIVE   Bilirubin Urine NEGATIVE NEGATIVE   Ketones, ur NEGATIVE NEGATIVE mg/dL   Protein, ur NEGATIVE NEGATIVE mg/dL   Urobilinogen, UA 0.2 0.0 - 1.0 mg/dL   Nitrite NEGATIVE NEGATIVE   Leukocytes, UA NEGATIVE  NEGATIVE  MRSA PCR Screening     Status: None   Collection Time: 10/07/14  5:18 PM  Result Value Ref Range   MRSA by PCR NEGATIVE NEGATIVE  Basic metabolic panel     Status: Abnormal   Collection Time: 10/08/14  2:30 AM  Result Value Ref Range   Sodium 139 135 - 145 mmol/L   Potassium 4.1 3.5 - 5.1 mmol/L   Chloride 97 (L) 101 - 111 mmol/L   CO2 32 22 - 32 mmol/L   Glucose, Bld 124 (H) 65 - 99 mg/dL   BUN 33 (H) 6 - 20 mg/dL   Creatinine, Ser 1.95 (H) 0.61 - 1.24 mg/dL   Calcium 9.2 8.9 - 10.3 mg/dL   GFR calc non Af Amer 31 (L) >60 mL/min   GFR calc Af Amer 35 (L) >60 mL/min   Anion gap 10 5 - 15  CBC     Status: Abnormal   Collection Time: 10/08/14  2:30 AM  Result Value Ref Range   WBC 8.5 4.0 - 10.5 K/uL   RBC 3.29 (L) 4.22 - 5.81 MIL/uL   Hemoglobin 9.9 (L) 13.0 - 17.0 g/dL   HCT 30.1 (L) 39.0 - 52.0 %   MCV 91.5 78.0 - 100.0 fL   MCH 30.1 26.0 - 34.0 pg   MCHC 32.9 30.0 - 36.0 g/dL  RDW 16.4 (H) 11.5 - 15.5 %   Platelets 253 150 - 400 K/uL  Protime-INR     Status: Abnormal   Collection Time: 10/08/14  2:30 AM  Result Value Ref Range   Prothrombin Time 26.6 (H) 11.6 - 15.2 seconds   INR 2.48 (H) 0.00 - 1.49  Troponin I     Status: Abnormal   Collection Time: 10/08/14  9:37 AM  Result Value Ref Range   Troponin I 0.04 (H) <0.031 ng/mL   Micro Results: Recent Results (from the past 240 hour(s))  MRSA PCR Screening     Status: None   Collection Time: 10/07/14  5:18 PM  Result Value Ref Range Status   MRSA by PCR NEGATIVE NEGATIVE Final    Comment:        The GeneXpert MRSA Assay (FDA approved for NASAL specimens only), is one component of a comprehensive MRSA colonization surveillance program. It is not intended to diagnose MRSA infection nor to guide or monitor treatment for MRSA infections.    Studies/Results: X-ray Chest Pa And Lateral  10/08/2014   CLINICAL DATA:  Shortness of breath, history of aortic valve replacement, CABG, permanent pacemaker  placement, COPD with history of heavy smoking.  EXAM: CHEST  2 VIEW  COMPARISON:  Portable chest x-ray of October 07, 2014  FINDINGS: The lungs remain hyperinflated. The pulmonary interstitial markings have markedly improved. The cardiac silhouette is more normal in size today the prosthetic aortic valve ring is visible. The central pulmonary vascularity is less engorged. There remain small bilateral pleural effusions greater on the right than on the left. There 7 intact sternal wires. The permanent pacemaker is in reasonable position. The bony thorax exhibits no acute abnormality.  IMPRESSION: Improving CHF superimposed upon COPD.  There is no pneumonia.   Electronically Signed   By: David  Martinique M.D.   On: 10/08/2014 07:45   Dg Chest Port 1 View  10/07/2014   CLINICAL DATA:  Shortness of breath.  EXAM: PORTABLE CHEST - 1 VIEW  COMPARISON:  October 06, 2014.  FINDINGS: Stable cardiomediastinal silhouette. Left-sided pacemaker is unchanged in position. Status post coronary artery bypass graft. No pneumothorax is noted. Stable small bilateral pleural effusions are noted. There appears to be increased left parahilar and right basilar interstitial opacities which may simply be due to radiographic technique, but increased pulmonary edema superimpose chronic scarring cannot be excluded. Rounded opacity seen in the region the right minor fissure most consistent with loculated fluid or pseudotumor. Bony thorax is intact.  IMPRESSION: Increased bilateral interstitial lung opacities are noted which may simply be due to radiographic technique, but increased pulmonary edema superimposed upon underlying chronic scarring cannot be excluded. Stable small bilateral pleural effusions are noted, with right greater than left. Stable small loculated effusion seen in right minor fissure.   Electronically Signed   By: Marijo Conception, M.D.   On: 10/07/2014 13:51   Medications: I have reviewed the patient's current  medications. Scheduled Meds: . allopurinol  200 mg Oral Daily  . antiseptic oral rinse  7 mL Mouth Rinse q12n4p  . atorvastatin  40 mg Oral QPM  . carvedilol  6.25 mg Oral BID WC  . chlorhexidine  15 mL Mouth Rinse BID  . clopidogrel  75 mg Oral Daily  . DULoxetine  60 mg Oral Daily  . sodium chloride  3 mL Intravenous Q12H  . spironolactone  12.5 mg Oral Daily  . tamsulosin  0.8 mg Oral QHS  . torsemide  40 mg Oral q1800  . [START ON 10/09/2014] torsemide  60 mg Oral Q breakfast  . warfarin  2 mg Oral q1800  . Warfarin - Pharmacist Dosing Inpatient   Does not apply q1800   Continuous Infusions:  PRN Meds:.acetaminophen **OR** acetaminophen, albuterol Assessment/Plan: Principal Problem:   Acute on chronic combined systolic and diastolic congestive heart failure Active Problems:   Hypertension   CKD (chronic kidney disease), stage III   Tissue AVR 2006 (in [place])   PAF   Hyperlipidemia   Amiodarone pulmonary toxicity   COPD (chronic obstructive pulmonary disease)  Timothy Durham is a 81 y.o. male with a complex past medical history, significant for HTN, CHF, CAD, COPD, PAF,  multiple strokes ('00, '07, '14), CKD grade III, arthritis, s/p biventricular pacemaker placement (11/15) and aortic valve replacement (10/10), who presents with shortness of breath, most likely due to an acute CHF exacerbation, taking into account BNP of 1758.5, pulmonary edema seen on CXR, and crackles on exam. Differential diagnosis includes pneumonia, though less likely with normal WBC and afebrile. Also considering an acute COPD exacerbation, although less likely with improvements in SOB with diuresis, lack of wheezing on exam, and yesterday's progression of SOB while on prednisone taper.  Acute on Chronic Combined CHF:  Echo on 5/16 shows EF 25-30%, grade 2DD; s/p bivent pacemaker placement 11/15. Down 1.7 L (net) since admission, weight down 1.3 kg since admission - furosemide 40 mg IV this AM, then d/c - start  torsemide 43m PO qAM, 6318mPO qPM - continue carvedilol 6.25 mg BID with meals - spironolactone 12.5 mg QID - clopidogrel 75 mg QID - appreciate Advanced HF Team's recommendations  Paroxsymal Atrial Fibrillation: s/p bioprosthetic aortic valve replacement 2006. INR 2.48 (therapeutic) on admission. - warfarin 18m78maily, dosing per pharmacy rec's - clopidogrel 75 mg QID  HTN: continue home meds - spironolactone and carvedilol (listed above)  History of stroke: Past strokes in 2000, 2007, and 2014, with multiple TIA's in 2015.  - anticoagulation with warfarin and clopidogrel (listed above)  COPD: Former smoker. O2 sats appropriate at 92-100. Discharged from ED on 7/11 with suspected COPD exacerbation, given prednisone taper which we have now held.  PFTs on 09/18/14. FVC Pre 2.96, 73%; FEV1 Pre 1.91, 66%; TLC 5.33, 74%; DLCO 14.59, 44%. - albuterol nebulizer PRN SOB  - BiPAP PRN SOB - 2L oxygen [place] - continual O2 monitoring  CKD grade III: eGFR of 31, creat 1.91, BUN 33; baseline creatinine ~1.6 - allopurinol 200m21mD (due to history of gout) - continue HTN medications listed earlier - BMP in AM  Hyperlipidemia: No need for acute interventions. Continue on home med. - continue atorvastatin 40 mg QHS  Depression/Anxiety: No need for acute interventions. Continue on home med. - Duloxetine 60 mg daily  Osteoarthritis: Continue on home meds, no need for acute intervention. - tylenol 500 mg PRN pain  Dipso: If continues to improve, plan to discharge in 1-2 days.  FEN: heart healthy diet DVT Prophylaxis: warfarin  Code Status: DNR  This is a [employer] information officere.  The care of the patient was discussed with Dr. BoswCharlynn Grimes Dr. KlimEppie Gibson the assessment and plan formulated with their assistance.  Please see their attached note for official documentation of the daily encounter.   LOS: 1 day   CamiDebbra Ridingd Student 10/08/2014, 1:43 PM

## 2014-10-09 ENCOUNTER — Telehealth (HOSPITAL_COMMUNITY): Payer: Self-pay | Admitting: *Deleted

## 2014-10-09 ENCOUNTER — Encounter (HOSPITAL_COMMUNITY): Payer: Self-pay | Admitting: Student

## 2014-10-09 LAB — CBC
HCT: 31 % — ABNORMAL LOW (ref 39.0–52.0)
Hemoglobin: 9.8 g/dL — ABNORMAL LOW (ref 13.0–17.0)
MCH: 29.4 pg (ref 26.0–34.0)
MCHC: 31.6 g/dL (ref 30.0–36.0)
MCV: 93.1 fL (ref 78.0–100.0)
Platelets: 285 10*3/uL (ref 150–400)
RBC: 3.33 MIL/uL — ABNORMAL LOW (ref 4.22–5.81)
RDW: 16.6 % — ABNORMAL HIGH (ref 11.5–15.5)
WBC: 9.8 10*3/uL (ref 4.0–10.5)

## 2014-10-09 LAB — BASIC METABOLIC PANEL
Anion gap: 8 (ref 5–15)
BUN: 39 mg/dL — ABNORMAL HIGH (ref 6–20)
CO2: 34 mmol/L — AB (ref 22–32)
Calcium: 8.9 mg/dL (ref 8.9–10.3)
Chloride: 95 mmol/L — ABNORMAL LOW (ref 101–111)
Creatinine, Ser: 2.02 mg/dL — ABNORMAL HIGH (ref 0.61–1.24)
GFR calc Af Amer: 34 mL/min — ABNORMAL LOW (ref >60)
GFR calc non Af Amer: 29 mL/min — ABNORMAL LOW (ref >60)
Glucose, Bld: 97 mg/dL (ref 65–99)
Potassium: 4.1 mmol/L (ref 3.5–5.1)
SODIUM: 137 mmol/L (ref 135–145)

## 2014-10-09 LAB — PROTIME-INR
INR: 2.47 — AB (ref 0.00–1.49)
Prothrombin Time: 26.4 seconds — ABNORMAL HIGH (ref 11.6–15.2)

## 2014-10-09 MED ORDER — TORSEMIDE 20 MG PO TABS: ORAL_TABLET | ORAL | Status: DC

## 2014-10-09 NOTE — Progress Notes (Signed)
Subjective: Timothy Durham says he is feeling good today. Denies shortness of breath and paroxysmal nocturnal dyspnea. Used 2L O2 on [place] during sleep, which he uses at home normally.   Objective: Vital signs in last 24 hours: Filed Vitals:   10/08/14 2045 10/09/14 0258 10/09/14 0705 10/09/14 0815  BP: 108/46 108/41 110/50   Pulse: 79 77 65   Temp: 98 F (36.7 C)  97.6 F (36.4 C)   TempSrc: Oral  Oral   Resp: 21 20 18    Height:      Weight:   67.586 kg (149 lb)   SpO2: 97% 96% 95% 96%   Weight change: -1.287 kg (-2 lb 13.4 oz)  Intake/Output Summary (Last 24 hours) at 10/09/14 0844 Last data filed at 10/09/14 0300  Gross per 24 hour  Intake    730 ml  Output    775 ml  Net    -45 ml   Physical Exam: General: awake and alert, cooperative & pleasant, no acute distress CV: RRR, 2/6 systolic murmur at left and right upper sternal border, no edema in UE and LE, no JVD Lung: bibasilar crackles bilaterally with mild improvements, no increased work of breathing, no wheezing Abd: soft, nontender, nondistended. normal bowel sounds Neuro: alert & oriented x4, no focal deficits, moves all 4 extremities spontaneously with no difficulty  Lab Results: Results for orders placed or performed during the hospital encounter of 10/07/14 (from the past 24 hour(s))  Troponin I     Status: Abnormal   Collection Time: 10/08/14  9:37 AM  Result Value Ref Range   Troponin I 0.04 (H) <0.031 ng/mL  CBC     Status: Abnormal   Collection Time: 10/09/14  3:33 AM  Result Value Ref Range   WBC 9.8 4.0 - 10.5 K/uL   RBC 3.33 (L) 4.22 - 5.81 MIL/uL   Hemoglobin 9.8 (L) 13.0 - 17.0 g/dL   HCT 31.0 (L) 39.0 - 52.0 %   MCV 93.1 78.0 - 100.0 fL   MCH 29.4 26.0 - 34.0 pg   MCHC 31.6 30.0 - 36.0 g/dL   RDW 16.6 (H) 11.5 - 15.5 %   Platelets 285 150 - 400 K/uL  Protime-INR     Status: Abnormal   Collection Time: 10/09/14  3:33 AM  Result Value Ref Range   Prothrombin Time 26.4 (H) 11.6 - 15.2 seconds   INR 2.47 (H) 0.00 - 9.45  Basic metabolic panel     Status: Abnormal   Collection Time: 10/09/14  3:33 AM  Result Value Ref Range   Sodium 137 135 - 145 mmol/L   Potassium 4.1 3.5 - 5.1 mmol/L   Chloride 95 (L) 101 - 111 mmol/L   CO2 34 (H) 22 - 32 mmol/L   Glucose, Bld 97 65 - 99 mg/dL   BUN 39 (H) 6 - 20 mg/dL   Creatinine, Ser 2.02 (H) 0.61 - 1.24 mg/dL   Calcium 8.9 8.9 - 10.3 mg/dL   GFR calc non Af Amer 29 (L) >60 mL/min   GFR calc Af Amer 34 (L) >60 mL/min   Anion gap 8 5 - 15   Micro Results: Recent Results (from the past 240 hour(s))  MRSA PCR Screening     Status: None   Collection Time: 10/07/14  5:18 PM  Result Value Ref Range Status   MRSA by PCR NEGATIVE NEGATIVE Final    Comment:        The GeneXpert MRSA Assay (FDA approved for NASAL  specimens only), is one component of a comprehensive MRSA colonization surveillance program. It is not intended to diagnose MRSA infection nor to guide or monitor treatment for MRSA infections.    Studies/Results: X-ray Chest Pa And Lateral  10/08/2014   CLINICAL DATA:  Shortness of breath, history of aortic valve replacement, CABG, permanent pacemaker placement, COPD with history of heavy smoking.  EXAM: CHEST  2 VIEW  COMPARISON:  Portable chest x-ray of October 07, 2014  FINDINGS: The lungs remain hyperinflated. The pulmonary interstitial markings have markedly improved. The cardiac silhouette is more normal in size today the prosthetic aortic valve ring is visible. The central pulmonary vascularity is less engorged. There remain small bilateral pleural effusions greater on the right than on the left. There 7 intact sternal wires. The permanent pacemaker is in reasonable position. The bony thorax exhibits no acute abnormality.  IMPRESSION: Improving CHF superimposed upon COPD.  There is no pneumonia.   Electronically Signed   By: David  Martinique M.D.   On: 10/08/2014 07:45   Dg Chest Port 1 View  10/07/2014   CLINICAL DATA:  Shortness  of breath.  EXAM: PORTABLE CHEST - 1 VIEW  COMPARISON:  October 06, 2014.  FINDINGS: Stable cardiomediastinal silhouette. Left-sided pacemaker is unchanged in position. Status post coronary artery bypass graft. No pneumothorax is noted. Stable small bilateral pleural effusions are noted. There appears to be increased left parahilar and right basilar interstitial opacities which may simply be due to radiographic technique, but increased pulmonary edema superimpose chronic scarring cannot be excluded. Rounded opacity seen in the region the right minor fissure most consistent with loculated fluid or pseudotumor. Bony thorax is intact.  IMPRESSION: Increased bilateral interstitial lung opacities are noted which may simply be due to radiographic technique, but increased pulmonary edema superimposed upon underlying chronic scarring cannot be excluded. Stable small bilateral pleural effusions are noted, with right greater than left. Stable small loculated effusion seen in right minor fissure.   Electronically Signed   By: Marijo Conception, M.D.   On: 10/07/2014 13:51   Medications: I have reviewed the patient's current medications. Scheduled Meds: . allopurinol  200 mg Oral Daily  . antiseptic oral rinse  7 mL Mouth Rinse q12n4p  . atorvastatin  40 mg Oral QPM  . carvedilol  6.25 mg Oral BID WC  . chlorhexidine  15 mL Mouth Rinse BID  . clopidogrel  75 mg Oral Daily  . docusate sodium  100 mg Oral BID  . DULoxetine  60 mg Oral Daily  . sodium chloride  3 mL Intravenous Q12H  . spironolactone  12.5 mg Oral Daily  . tamsulosin  0.8 mg Oral QHS  . torsemide  40 mg Oral q1800  . torsemide  60 mg Oral Q breakfast  . warfarin  2 mg Oral q1800  . Warfarin - Pharmacist Dosing Inpatient   Does not apply q1800   Continuous Infusions:  PRN Meds:.acetaminophen **OR** acetaminophen, albuterol, polyethylene glycol Assessment/Plan: Principal Problem:   Acute on chronic combined systolic and diastolic congestive heart  failure Active Problems:   Hypertension   CKD (chronic kidney disease), stage III   Tissue AVR 2006 (in [place])   PAF   Hyperlipidemia   Amiodarone pulmonary toxicity   COPD (chronic obstructive pulmonary disease)  Timothy Durham is a 81 y.o. male with a complex past medical history, significant for HTN, CHF, CAD, COPD, PAF, multiple strokes ('00, '07, '14), CKD grade III, arthritis, s/p biventricular pacemaker placement (11/15) and aortic  valve replacement (10/10), who presents with shortness of breath, due to an acute CHF exacerbation. He has improved greatly and at baseline symptom-wise.  Acute on Chronic Combined CHF: Echo on 5/16 shows EF 25-30%, grade 2DD; s/p bivent pacemaker placement 11/15. Down 2.0 L (net) since admission, weight down 1.5 kg since admission - torsemide 47m PO qAM, 468mPO qPM - carvedilol 6.25 mg BID with meals - spironolactone 12.5 mg QID - clopidogrel 75 mg QID - appreciate Advanced HF Team's recommendations - follow up with HF clinic on 10/29/2014  Paroxsymal Atrial Fibrillation: s/p bioprosthetic aortic valve replacement 2006. INR 2.47 (therapeutic) on admission. - warfarin 74m68maily, dosing per pharmacy rec's - clopidogrel 75 mg QID  HTN: continue home meds - spironolactone and carvedilol (listed above)  History of stroke: Past strokes in 2000, 2007, and 2014, with multiple TIA's in 2015.  - anticoagulation with warfarin and clopidogrel (listed above)  COPD: Former smoker. O2 sats appropriate at 92-100. Discharged from ED on 7/11 with suspected COPD exacerbation, given prednisone taper which we have now held. PFTs on 09/18/14. FVC Pre 2.96, 73%; FEV1 Pre 1.91, 66%; TLC 5.33, 74%; DLCO 14.59, 44%. - albuterol nebulizer PRN SOB  - 2L oxygen [place] at night  CKD grade III: eGFR of 29, creat 2.02, BUN 39; baseline creatinine ~1.6 - allopurinol 200m66mD (due to history of gout) - continue HTN medications listed earlier - could consider addition of ACEI/ARB in  future if kidney function remains in stage 3 of CKD  Hyperlipidemia: No need for acute interventions. Continue on home med. - continue atorvastatin 40 mg QHS  Depression/Anxiety: No need for acute interventions. Continue on home med. - Duloxetine 60 mg daily  Osteoarthritis: Continue on home meds, no need for acute intervention. - tylenol 500 mg PRN pain  Dipso: Discharge today. - follow up with HF clinic on 10/29/2014 - should follow up with pulmonologist for chronic COPD  FEN: heart healthy diet DVT Prophylaxis: warfarin  Code Status: DNR  This is a [employer] information officere.  The care of the patient was discussed with Dr. BoswCharlynn Grimes Dr. EmokDenton Brick the assessment and plan formulated with their assistance.  Please see their attached note for official documentation of the daily encounter.   LOS: 2 days   CamiDebbra Ridingd Student 10/09/2014, 8:44 AM

## 2014-10-09 NOTE — Care Management Important Message (Signed)
Important Message  Patient Details  Name: Timothy Durham MRN: [id number] Date of Birth: 1930/04/07   Medicare Important Message Given:  Yes-second notification given    Yvonna Alanis 10/09/2014, 2:38 PM

## 2014-10-09 NOTE — Discharge Summary (Signed)
Name: Timothy Durham MRN: [id number] DOB: 1930-07-14 81 y.o. PCP: Timothy Durham. Timothy Iron, MD  Date of Admission: 10/07/2014 12:52 PM Date of Discharge: 10/09/2014 Attending Physician: No att. providers found  Discharge Diagnosis: Principal Problem:   Acute on chronic combined systolic and diastolic congestive heart failure Active Problems:   Hypertension   CKD (chronic kidney disease), stage III   Tissue AVR 2006 (in [place])   PAF   Hyperlipidemia   Amiodarone pulmonary toxicity   COPD (chronic obstructive pulmonary disease)  Discharge Medications:   Medication List    STOP taking these medications        predniSONE 50 MG tablet  Commonly known as:  DELTASONE      TAKE these medications        acetaminophen 500 MG tablet  Commonly known as:  TYLENOL  Take 1,000 mg by mouth at bedtime.     allopurinol 100 MG tablet  Commonly known as:  ZYLOPRIM  Take 200 mg by mouth daily.     atorvastatin 40 MG tablet  Commonly known as:  LIPITOR  Take 40 mg by mouth every evening.     CALCIUM 600 + D PO  Take 2 tablets by mouth every morning.     carvedilol 6.25 MG tablet  Commonly known as:  COREG  Take 1 tablet (6.25 mg total) by mouth 2 (two) times daily with a meal.     clopidogrel 75 MG tablet  Commonly known as:  PLAVIX  Take 75 mg by mouth daily.     DULoxetine 60 MG capsule  Commonly known as:  CYMBALTA  Take 60 mg by mouth daily.     Fish Oil 1200 MG Caps  Take 1 capsule by mouth every morning.     omeprazole 40 MG capsule  Commonly known as:  PRILOSEC  Take 40 mg by mouth every morning.     potassium chloride SA 20 MEQ tablet  Commonly known as:  K-DUR,KLOR-CON  Take 1 tablet (20 mEq total) by mouth as needed. TAKE ONLY WHEN YOU TAKE METOLAZONE     spironolactone 25 MG tablet  Commonly known as:  ALDACTONE  Take 0.5 tablets (12.5 mg total) by mouth daily.     tamsulosin 0.4 MG Caps capsule  Commonly known as:  FLOMAX  Take 2 capsules (0.8 mg total) by mouth at  bedtime.     torsemide 20 MG tablet  Commonly known as:  DEMADEX  60 mg in the morning, 40 mg in the evening     warfarin 2 MG tablet  Commonly known as:  COUMADIN  Take 2 mg by mouth daily.        Disposition and follow-up:   [name] was discharged from [facility] in Good condition.  At the hospital follow up visit please address:  1.  Breathing and fluid status. Torsemide was increased at discharge.   2.  Labs / imaging needed at time of follow-up: None  3.  Pending labs/ test needing follow-up: None  Follow-up Appointments:     Follow-up Information    Follow up with [facility] On 10/29/2014.   Specialty:  Cardiology   Why:  For HF follow up @ 1120 am. Please bring all of your medications with you to your visit. The code for patient parking at the HF center for August is 0008.   Contact information:   [address] [id number] mc [place] [address] [phone]  Follow up with [facility] On 10/15/2014.   Specialty:  Cardiology   Why:  @ 1030 for labwork. The code for patient parking is 8000 for July.   Contact information:   [address] [id number] mc [place] [address] [phone]      Discharge Instructions: Discharge Instructions    (HEART FAILURE PATIENTS) Call MD:  Anytime you have any of the following symptoms: 1) 3 pound weight gain in 24 hours or 5 pounds in 1 week 2) shortness of breath, with or without a dry hacking cough 3) swelling in the hands, feet or stomach 4) if you have to sleep on extra pillows at night in order to breathe.    Complete by:  As directed      Diet - low sodium heart healthy    Complete by:  As directed      Increase activity slowly    Complete by:  As directed            Consultations: Treatment Team:  Rounding Lbcardiology, MD  Procedures Performed:  X-ray Chest  Pa And Lateral  10/08/2014   CLINICAL DATA:  Shortness of breath, history of aortic valve replacement, CABG, permanent pacemaker placement, COPD with history of heavy smoking.  EXAM: CHEST  2 VIEW  COMPARISON:  Portable chest x-ray of October 07, 2014  FINDINGS: The lungs remain hyperinflated. The pulmonary interstitial markings have markedly improved. The cardiac silhouette is more normal in size today the prosthetic aortic valve ring is visible. The central pulmonary vascularity is less engorged. There remain small bilateral pleural effusions greater on the right than on the left. There 7 intact sternal wires. The permanent pacemaker is in reasonable position. The bony thorax exhibits no acute abnormality.  IMPRESSION: Improving CHF superimposed upon COPD.  There is no pneumonia.   Electronically Signed   By: David  [place] M.D.   On: 10/08/2014 07:45   Dg Chest Port 1 View  10/07/2014   CLINICAL DATA:  Shortness of breath.  EXAM: PORTABLE CHEST - 1 VIEW  COMPARISON:  October 06, 2014.  FINDINGS: Stable cardiomediastinal silhouette. Left-sided pacemaker is unchanged in position. Status post coronary artery bypass graft. No pneumothorax is noted. Stable small bilateral pleural effusions are noted. There appears to be increased left parahilar and right basilar interstitial opacities which may simply be due to radiographic technique, but increased pulmonary edema superimpose chronic scarring cannot be excluded. Rounded opacity seen in the region the right minor fissure most consistent with loculated fluid or pseudotumor. Bony thorax is intact.  IMPRESSION: Increased bilateral interstitial lung opacities are noted which may simply be due to radiographic technique, but increased pulmonary edema superimposed upon underlying chronic scarring cannot be excluded. Stable small bilateral pleural effusions are noted, with right greater than left. Stable small loculated effusion seen in right minor fissure.   Electronically  Signed   By: Lupita Raider, M.D.   On: 10/07/2014 13:51   Dg Chest Portable 1 View  10/06/2014   CLINICAL DATA:  81 year old male with shortness of breath  EXAM: PORTABLE CHEST - 1 VIEW  COMPARISON:  Chest radiograph dated 08/18/2014  FINDINGS: Single-view of the chest demonstrates emphysematous changes of the lungs. Linear scarring noted in the right mid lung field. There are bilateral lower lung field interstitial prominence which may be atelectatic changes or scarring. Pneumonia is not excluded. Small bilateral pleural effusions. Stable cardiac silhouette. Median sternotomy wires and left pectoral pacemaker device.  The osseous structures appear unremarkable. Probable calcified right hilar granuloma.  IMPRESSION: Small bilateral pleural effusions with bilateral lower lung field atelectasis /scarring versus pneumonia. Clinical correlation and follow-up recommended.   Electronically Signed   By: Elgie Collard M.D.   On: 10/06/2014 01:52   Admission HPI: 81 year old white male with a PMH significant for combined systolic and diastolic CHF (ECHO 12/2013 EF 14-78%), PAF, Aortic valve replacement, HTN, Stroke, COPD comes to the ED with complaints of worsening SOB. Patient was on BiPAP can could not communicate well, wife was at the bedside and provided most of the history. Symptoms started suddenly yesterday overnight. He woke up after 1-2 hours of sleep with SOB and [facility]. He was treated for a COPD exacerbation there, receiving nebs and steroids. Symptoms resolved within a few hours and he was sent home with a Prednisone taper. Symptoms return suddenly this morning with worsening SOB and a productive cough with yellow sputum. Weights have been stable at home, 152-153 everyday.   BNP was 1758 today from 971 yesterday. CXR bilateral interstitial lung opacities with increased pulmonary edema and stable small bilateral pleural effusions. He was put on BiPAP in the ED and given a dose  of IV lasix with good diuresis. He tolerated BiPAP well and by the time we saw him after diuresis he no long complained of SOB. He denies any fevers, chills, chest pain, leg edema, dysuria. We stopped BiPAP and switched to 2L [place], sating 92-96%. We admitted him to step down until with BiPAP as needed. He is on 2L O2 at night at home, sleeps with one pillow behind his back.   Hospital Course by problem list: Principal Problem:   Acute on chronic combined systolic and diastolic congestive heart failure Active Problems:   Hypertension   CKD (chronic kidney disease), stage III   Tissue AVR 2006 (in [place])   PAF   Hyperlipidemia   Amiodarone pulmonary toxicity   COPD (chronic obstructive pulmonary disease)   Acute on Chronic Combed CHF: In the [facility] emergency department it was felt that he was having an acute exacerbation of his chronic CHF with a BNP elevated to 1758 and troponin of 0.03. A chest x-ray showed bilateral interstitial lung opacities and stable small bilateral pleural effusions. EKG showed normal sinus tachycardia (HR 102). He was placed on BiPAP to aid in his work of breathing and given a dose of IV furosemide 40 mg, with good diuresis and marked improvements in his dyspnea. Within hours of initiation, he was removed from the BiPAP and put on 2L  and continued on IV furosemide 40 mg twice daily. He was seen by the HF team the following morning and switched to oral torsemide 60mg  in the morning, 40 mg at night after receiving one dose of IV lasix in the AM.  The HF team did not feel that he would benefit from starting an ACE-I or ARB given his CKD as well as an episode of hyperkalemia in December of 2015 while on an ACE-I. Over course of stay, there was a net out of 5.2L, with a weight decrease of 3 lbs since admission. At discharge, [name] denied any difficulty in breathing, orthopnea, and paroxysmal nocturnal dyspnea, with no edema in extremities on exam. Other than an increase in  torsemide 60 mg in the morning, 40 mg in the evening, [name] remains on his home regimen of carvedilol 6.25mg  twice daily with meals, spironolactone  12.5mg  daily, and clopidogrel  daily. [name] was discharged with follow up scheduled with Heart Failure team on 10/29/14.  COPD: His clinical presentation looked less like a COPD exacerbation due lack of wheezing on exam, progression of shortness of breath while on prednisone, and improvements in symptoms with diuresis and BiPAP. Oxygen saturations remained stable in the 90's throughout hospital stay on 2L [place]. The morning of discharge he was taken off supplemental O2 with O2 sats remaninig in the upper 90s. His prednisone taper was stopped, and otherwise he is discharged on his home regimen of albuterol nebulizer when needed and 2L of oxygen on nasal cannula at night.   Paroxysmal Atrial Fibrillation: [name] is on long-term anticoagulation with warfarin  daily for his history of Paroxysmal Atrial Fibrillation. EKG on 7/12 showed sinus tachycardia (HR 102), and no instances of Atrial Fibrillation were observed during this hospital stay, therefore there was no need for any interventions. INR remained therapeutic and stable, with an INR of 2.47 on discharge. He was continued on his home dose warfarin during his hospitalization as well as at discharge.   HTN: On admission blood pressure was 118/46, and remained stable in the 110's/50's during his hospital stay. He was continued on his home regimen of spironolactone and carvedilol during his hospitalization as well as at discharge.    CKD stage III: On admission, [name] had a GFR of 30 and creatinine of 1.99, remaining stable during hospital stay. There was no need for acute interventions with his chronic kidney disease. He will continue on his home hypertension/congestive heart failure regimen as outlined above.  Hyerlipidemia: During hospital stay, there was no need for acute  interventions. [name] was discharged on his home regimen of atorvastatin 40 mg daily before bedtime.  Depression/Anxiety: During hospital stay, there was no need for any changes from home regimen. Will continue with Duloxetine  daily on discharge.  Discharge Vitals:   BP 121/39 mmHg  Pulse 76  Temp(Src) 97.6 F (36.4 C) (Oral)  Resp 18  Ht  (1.778 m)  Wt 67.586 kg (149 lb)  BMI 21.38 kg/m2  SpO2 98%  Discharge Labs:  Results for orders placed or performed during the hospital encounter of 10/07/14 (from the past 24 hour(s))  CBC     Status: Abnormal   Collection Time: 10/09/14  3:33 AM  Result Value Ref Range   WBC 9.8 4.0 - 10.5 K/uL   RBC 3.33 (L) 4.22 - 5.81 MIL/uL   Hemoglobin 9.8 (L) 13.0 - 17.0 g/dL   HCT 16.1 (L) 09.6 - 04.5 %   MCV 93.1 78.0 - 100.0 fL   MCH 29.4 26.0 - 34.0 pg   MCHC 31.6 30.0 - 36.0 g/dL   RDW 40.9 (H) 81.1 - 91.4 %   Platelets 285 150 - 400 K/uL  Protime-INR     Status: Abnormal   Collection Time: 10/09/14  3:33 AM  Result Value Ref Range   Prothrombin Time 26.4 (H) 11.6 - 15.2 seconds   INR 2.47 (H) 0.00 - 1.49  Basic metabolic panel     Status: Abnormal   Collection Time: 10/09/14  3:33 AM  Result Value Ref Range   Sodium 137 135 - 145 mmol/L   Potassium 4.1 3.5 - 5.1 mmol/L   Chloride 95 (L) 101 - 111 mmol/L   CO2 34 (H) 22 - 32 mmol/L   Glucose, Bld 97 65 - 99 mg/dL   BUN 39 (H) 6 - 20 mg/dL  Creatinine, Ser 2.02 (H) 0.61 - 1.24 mg/dL   Calcium 8.9 8.9 - 16.1 mg/dL   GFR calc non Af Amer 29 (L) >60 mL/min   GFR calc Af Amer 34 (L) >60 mL/min   Anion gap 8 5 - 15    Signed: Valentino Nose, MD 10/09/2014, 2:28 PM

## 2014-10-09 NOTE — Discharge Instructions (Signed)

## 2014-10-09 NOTE — Progress Notes (Signed)
Pt has orders to be discharged. Discharge instructions given and pt has no additional questions at this time. Medication regimen reviewed and pt educated. Pt verbalized understanding and has no additional questions. Telemetry box removed. IV removed and site in good condition. Pt stable and waiting for transportation.   Naoko Diperna RN 

## 2014-10-09 NOTE — Progress Notes (Signed)
Advanced Heart Failure Rounding Note  PCP: Luiz Iron Primary Cardiologist: Ladona Ridgel  Subjective:    Feeling good today.  No complaints of SOB.  Used 02 during the night as he does at home, but has not required otherwise.  Likely home today per IM.  Has follow up with HF clinic on 10/29/14. Down 3 lbs this admission.    Objective:   Weight Range: 149 lb (67.586 kg) Body mass index is 21.38 kg/(m^2).   Vital Signs:   Temp:  [97.3 F (36.3 C)-98 F (36.7 C)] 97.6 F (36.4 C) (07/14 0705) Pulse Rate:  [65-79] 65 (07/14 0705) Resp:  [18-22] 18 (07/14 0705) BP: (101-114)/(37-50) 110/50 mmHg (07/14 0705) SpO2:  [95 %-98 %] 95 % (07/14 0705) Weight:  [149 lb (67.586 kg)-149 lb 8 oz (67.813 kg)] 149 lb (67.586 kg) (07/14 0705) Last BM Date: 10/08/14  Weight change: Filed Weights   10/08/14 0300 10/08/14 1030 10/09/14 0705  Weight: 149 lb 7.6 oz (67.8 kg) 149 lb 8 oz (67.813 kg) 149 lb (67.586 kg)    Intake/Output:   Intake/Output Summary (Last 24 hours) at 10/09/14 0725 Last data filed at 10/09/14 0300  Gross per 24 hour  Intake    730 ml  Output    775 ml  Net    -45 ml     Physical Exam: General: Well appearing. NAD HEENT: normal Neck: supple. JVP not elevated. Carotids 2+ bilat; no bruits. No lymphadenopathy or thryomegaly appreciated. Cor: PMI nondisplaced. Regular rate & rhythm. No rubs, gallops. 2/6 MR murmur Lungs: Normal effort. Diminished bibasilarly with crackles to lower and middle lobes. Rhoncus sounds that clear with cough. Abdomen: soft, nontender, nondistended. No hepatosplenomegaly. No bruits or masses. + bowel sounds. Extremities: no cyanosis, clubbing, rash, edema Neuro: alert & orientedx3, cranial nerves grossly intact. moves all 4 extremities w/o difficulty. Affect pleasant   Telemetry: A sensed V Paced rhythm in 80s currently. Occasional PVCs.  Labs: CBC  Recent Labs  10/07/14 1300 10/08/14 0230 10/09/14 0333  WBC 15.6* 8.5 9.8  NEUTROABS  14.8*  --   --   HGB 11.3* 9.9* 9.8*  HCT 35.0* 30.1* 31.0*  MCV 92.6 91.5 93.1  PLT 294 253 285   Basic Metabolic Panel  Recent Labs  10/08/14 0230 10/09/14 0333  NA 139 137  K 4.1 4.1  CL 97* 95*  CO2 32 34*  GLUCOSE 124* 97  BUN 33* 39*  CALCIUM 9.2 8.9   Liver Function Tests  Recent Labs  10/07/14 1300  AST 19  ALT 12*  ALKPHOS 69  BILITOT 0.7  PROT 6.6  ALBUMIN 3.9   No results for input(s): LIPASE, AMYLASE in the last 72 hours. Cardiac Enzymes  Recent Labs  10/07/14 1300 10/08/14 0937  TROPONINI 0.03 0.04*    BNP: BNP (last 3 results)  Recent Labs  08/28/14 1640 10/06/14 0130 10/07/14 1300  BNP 1480.8* 971.4* 1758.5*    ProBNP (last 3 results)  Recent Labs  02/24/14 1606 02/26/14 1623 03/06/14 1129  PROBNP 2973.0* 18948.0* 5943.0*     D-Dimer No results for input(s): DDIMER in the last 72 hours. Hemoglobin A1C No results for input(s): HGBA1C in the last 72 hours. Fasting Lipid Panel No results for input(s): CHOL, HDL, LDLCALC, TRIG, CHOLHDL, LDLDIRECT in the last 72 hours. Thyroid Function Tests No results for input(s): TSH, T4TOTAL, T3FREE, THYROIDAB in the last 72 hours.  Invalid input(s): FREET3  Other results:     Imaging/Studies:  X-ray Chest Pa And Lateral  10/08/2014   CLINICAL DATA:  Shortness of breath, history of aortic valve replacement, CABG, permanent pacemaker placement, COPD with history of heavy smoking.  EXAM: CHEST  2 VIEW  COMPARISON:  Portable chest x-ray of October 07, 2014  FINDINGS: The lungs remain hyperinflated. The pulmonary interstitial markings have markedly improved. The cardiac silhouette is more normal in size today the prosthetic aortic valve ring is visible. The central pulmonary vascularity is less engorged. There remain small bilateral pleural effusions greater on the right than on the left. There 7 intact sternal wires. The permanent pacemaker is in reasonable position. The bony thorax exhibits  no acute abnormality.  IMPRESSION: Improving CHF superimposed upon COPD.  There is no pneumonia.   Electronically Signed   By: David  [place] M.D.   On: 10/08/2014 07:45   Dg Chest Port 1 View  10/07/2014   CLINICAL DATA:  Shortness of breath.  EXAM: PORTABLE CHEST - 1 VIEW  COMPARISON:  October 06, 2014.  FINDINGS: Stable cardiomediastinal silhouette. Left-sided pacemaker is unchanged in position. Status post coronary artery bypass graft. No pneumothorax is noted. Stable small bilateral pleural effusions are noted. There appears to be increased left parahilar and right basilar interstitial opacities which may simply be due to radiographic technique, but increased pulmonary edema superimpose chronic scarring cannot be excluded. Rounded opacity seen in the region the right minor fissure most consistent with loculated fluid or pseudotumor. Bony thorax is intact.  IMPRESSION: Increased bilateral interstitial lung opacities are noted which may simply be due to radiographic technique, but increased pulmonary edema superimposed upon underlying chronic scarring cannot be excluded. Stable small bilateral pleural effusions are noted, with right greater than left. Stable small loculated effusion seen in right minor fissure.   Electronically Signed   By: Lupita Raider, M.D.   On: 10/07/2014 13:51     Latest Echo  Latest Cath   Medications:     Scheduled Medications: . allopurinol  200 mg Oral Daily  . antiseptic oral rinse  7 mL Mouth Rinse q12n4p  . atorvastatin  40 mg Oral QPM  . carvedilol  6.25 mg Oral BID WC  . chlorhexidine  15 mL Mouth Rinse BID  . clopidogrel  75 mg Oral Daily  . docusate sodium  100 mg Oral BID  . DULoxetine  60 mg Oral Daily  . sodium chloride  3 mL Intravenous Q12H  . spironolactone  12.5 mg Oral Daily  . tamsulosin  0.8 mg Oral QHS  . torsemide  40 mg Oral q1800  . torsemide  60 mg Oral Q breakfast  . warfarin  2 mg Oral q1800  . Warfarin - Pharmacist Dosing Inpatient    Does not apply q1800     Infusions:     PRN Medications:  acetaminophen **OR** acetaminophen, albuterol, polyethylene glycol   Assessment/Plan   1. Acute on Chronic Combined CHF, ECHO 5/16 EF 25-30%, Grade 2 DD - Volume status appears stable/not elevated. - NICM via cath 12/2013 - Only out 45 mL yesterday but good UO. Down 3 lbs since admission on IV lasix. - Started back on po torsemide last night. 60 mg am 40 mg pm. Will discuss with MD optimal home dosing. - Continue Coreg and spiro - No ACE/ARB in CKD - Recent Echo 5/16 - CXR shows possible increased edema with chronic scarring. Small stable bilaterally pleural effusions - K 4.1 - s/p Bivi Pacemaker - Hydralazine/Imdur were held to promote higher BP in setting of TIAs. 2. COPD -  PFTs on 09/18/14. FVC Pre 2.96, 73%; FEV1 Pre 1.91, 66%; TLC 5.33, 74%; DLCO 14.59, 44%. - Moderate obstruction, Mild restriction 3. AKI on CKD, Stage 3 - Baseline past 6 months 1.6.  - 2.02 currently. Slightly upward but stable. Will follow closely with diuresis. 4. PAF  - On chronic coumadin, dosing per pharm - He is in NSR today.  - INR 2.47 today - h/o amiodarone lung toxicity with restrictive PFTs.  Off amiodarone now. 5. Hx of Stroke/TIA - On Coumadin as above - On Plavix and statin as well 6. HTN - On Coreg and spiro - Hydralazine/Imdur were held previously to promote higher BP in setting of TIAs. 7. HLD - atorvastatin  Length of Stay: 2  Graciella Freer PA-C 10/09/2014, 7:25 AM  Advanced Heart Failure Team Pager [phone] (M-F; 7a - 4p)  Please contact [facility] for night-coverage after hours (4p -7a ) and weekends on [email or web address]  Patient seen with PA, agree with the above note.  1. 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 Has [employer] CRT-P system. CXR with pulmonary edema and BNP elevated. He diuresed on IV Lasix. On exam today, he does not appear volume overloaded.  - He has been on torsemide 40 mg bid at home, would use 60 qam/40 qpm at discharge.  - Continue current Coreg and spironolactone.  2. CKD: Mild rise in creatinine with diuresis, watch closely. Will need to arrange BMET at CHF clinic next week. 3. H/o amiodarone lung toxicity: With restrictive component to PFTs. Off amiodarone now.  4. COPD: ?Component of COPD exacerbation, had moderate obstruction on last PFTs. Per primary service.  5. H/o TIAs/CVAs: Need to continue Plavix in addition to warfarin.  6. Atrial fibrillation: Paroxysmal. In NSR today. Off amiodarone with history of toxicity. Continue warfarin.  7. Bioprosthetic aortic valve: Stable last echo.  8. Disposition: Agree that he is ready to go home.  Has followup CHF clinic 8/3.  Needs BMET next week. Only cardiac med change will be increase in torsemide incrementally to 60 qam/40 qpm.   Marca Ancona 10/09/2014 8:33 AM

## 2014-10-09 NOTE — Telephone Encounter (Signed)
appt scheduled for 10/13/14 @9 :45

## 2014-10-09 NOTE — Progress Notes (Signed)
ANTICOAGULATION CONSULT NOTE - Follow Up Consult  Pharmacy Consult for Warfarin Indication: atrial fibrillation  Allergies  Allergen Reactions  . Ambien [Zolpidem] Other (See Comments)    Hallucinations  . Ativan [Lorazepam] Other (See Comments)    hallucinations  . Oxycontin [Oxycodone] Other (See Comments)    hallucinations  . Amiodarone   . Penicillins     Unknown childhood reaction   . Sulfa Antibiotics     unknown    Patient Measurements: Height:  (177.8 cm) Weight: 149 lb (67.586 kg) (b scale) IBW/kg (Calculated) : 73 Heparin Dosing Weight:   Vital Signs: Temp: 97.6 F (36.4 C) (07/14 0705) Temp Source: Oral (07/14 0705) BP: 110/50 mmHg (07/14 0705) Pulse Rate: 65 (07/14 0705)  Labs:  Recent Labs  10/07/14 1300 10/08/14 0230 10/08/14 0937 10/09/14 0333  HGB 11.3* 9.9*  --  9.8*  HCT 35.0* 30.1*  --  31.0*  PLT 294 253  --  285  LABPROT 28.3* 26.6*  --  26.4*  INR 2.71* 2.48*  --  2.47*  CREATININE 1.91* 1.95*  --  2.02*  TROPONINI 0.03  --  0.04*  --     Estimated Creatinine Clearance: 27.4 mL/min (by C-G formula based on Cr of 2.02).   Medical History: Past Medical History  Diagnosis Date  . Arthritis   . Non-obstructive CAD     a. 12/2013 NSTEMI/Cath: LM nl, LAD 20, LCX 40-50, RCA 20.  Marland Kitchen Hypertension   . Stroke     a. July 2000;  b. January 2007;  c. TIA in 2014.  . CKD (chronic kidney disease), stage III   . GERD (gastroesophageal reflux disease)   . Foot pain, bilateral   . H/O hematuria   . Hyperlipidemia   . Vitamin D deficiency   . Chronic combined systolic and diastolic CHF (congestive heart failure)     a. 12/2013 Echo: EF 30-35%, mod conc LVH, Gr 3 DD, mild AI, mod-sev MR, mod dil LA, mild TR.  Marland Kitchen PAF (paroxysmal atrial fibrillation)     a. CHA2DS2VASc = 7--> Chronic Coumadin.  Marland Kitchen NICM (nonischemic cardiomyopathy)     a. 12/2013 Echo: EF 30-35%.  . Aortic valve prosthesis present     a. 2006: S/P bioprosthetic AVR.  Marland Kitchen PAD  (peripheral artery disease)     a. s/p LLE stenting.  . Carotid arterial disease     a. s/p R CEA  . History of tobacco abuse   . Moderate to Severe Mitral Regurgitation     a. 12/2013 Echo: Mod-Sev MR.  Marland Kitchen Anxiety   . COPD (chronic obstructive pulmonary disease)   . Coronary arteriosclerosis Oct 2015    med Rx  . Shortness of breath dyspnea   . Depression   . Presence of permanent cardiac pacemaker   . Heart murmur   . Dysrhythmia     Medications:  Prescriptions prior to admission  Medication Sig Dispense Refill Last Dose  . acetaminophen (TYLENOL) 500 MG tablet Take 1,000 mg by mouth at bedtime.    10/06/2014 at Unknown time  . allopurinol (ZYLOPRIM) 100 MG tablet Take 200 mg by mouth daily.   10/07/2014 at Unknown time  . atorvastatin (LIPITOR) 40 MG tablet Take 40 mg by mouth every evening.   10/06/2014 at Unknown time  . Calcium Carb-Cholecalciferol (CALCIUM 600 + D PO) Take 2 tablets by mouth every morning.   10/06/2014 at Unknown time  . carvedilol (COREG) 6.25 MG tablet Take 1 tablet (6.25 mg  total) by mouth 2 (two) times daily with a meal. 60 tablet 3 10/07/2014 at 800  . clopidogrel (PLAVIX) 75 MG tablet Take 75 mg by mouth daily.   10/07/2014 at Unknown time  . DULoxetine (CYMBALTA) 60 MG capsule Take 60 mg by mouth daily.    10/07/2014 at Unknown time  . Omega-3 Fatty Acids (FISH OIL) 1200 MG CAPS Take 1 capsule by mouth every morning.   10/07/2014 at Unknown time  . omeprazole (PRILOSEC) 40 MG capsule Take 40 mg by mouth every morning.   10/07/2014 at Unknown time  . potassium chloride SA (K-DUR,KLOR-CON) 20 MEQ tablet Take 1 tablet (20 mEq total) by mouth as needed. TAKE ONLY WHEN YOU TAKE METOLAZONE 30 tablet 3 months ago  . predniSONE (DELTASONE) 50 MG tablet One tablet PO daily for 4 days (Patient taking differently: Take 50 mg by mouth daily with breakfast. One tablet PO daily for 4 days) 4 tablet 0 10/07/2014 at Unknown time  . spironolactone (ALDACTONE) 25 MG tablet Take 0.5  tablets (12.5 mg total) by mouth daily. 15 tablet 3 10/07/2014 at Unknown time  . tamsulosin (FLOMAX) 0.4 MG CAPS capsule Take 2 capsules (0.8 mg total) by mouth at bedtime. 60 capsule 0 10/06/2014 at Unknown time  . torsemide (DEMADEX) 20 MG tablet Take 2 tablets (40 mg total) by mouth 2 (two) times daily. 56 tablet 3 10/07/2014 at Unknown time  . warfarin (COUMADIN) 2 MG tablet Take 2 mg by mouth daily.   10/06/2014 at Unknown time   Scheduled:  . allopurinol  200 mg Oral Daily  . antiseptic oral rinse  7 mL Mouth Rinse q12n4p  . atorvastatin  40 mg Oral QPM  . carvedilol  6.25 mg Oral BID WC  . chlorhexidine  15 mL Mouth Rinse BID  . clopidogrel  75 mg Oral Daily  . docusate sodium  100 mg Oral BID  . DULoxetine  60 mg Oral Daily  . sodium chloride  3 mL Intravenous Q12H  . spironolactone  12.5 mg Oral Daily  . tamsulosin  0.8 mg Oral QHS  . torsemide  40 mg Oral q1800  . torsemide  60 mg Oral Q breakfast  . warfarin  2 mg Oral q1800  . Warfarin - Pharmacist Dosing Inpatient   Does not apply q1800   Infusions:    Assessment: 81yo male with history of Afib, HTN, CKD3, and NICM presents with SOB. Pharmacy is consulted to dose warfarin for atrial fibrillation. INR 2.47,CBC stable, sCr slight bump 1.9 > 2.  PTA Warfarin Dose: 2mg /d with last dose PTA 10/06/14  Goal of Therapy:  INR 2-3 Monitor platelets by anticoagulation protocol: Yes   Plan:  Warfarin 2mg  daily  -continue home dose Daily INR/CBC Monitor s/sx of bleeding  Leota Sauers Pharm.D. CPP, BCPS Clinical Pharmacist [phone] 10/09/2014 7:38 AM

## 2014-10-09 NOTE — Progress Notes (Signed)
Physical Therapy Treatment Patient Details Name: Timothy Durham MRN: [id number] DOB: 1930/09/10 Today's Date: 10/09/2014    History of Present Illness Patient is an 81 y.o. male admitted with SOB, PMH positive for combined systolic and diastolic CHF (ECHO 12/2013 EF 40-98%), PAF, Aortic valve replacement, HTN, Stroke, COPD.    PT Comments    Pt with multiple falls at home not wearing AFO or lift in the house, refuses to use rollator at any time and denied even attempting cane. Pt rather determined to be independent and not use DME although aware of fall risk and complications from falls. Wife present throughout education with pt informed of need for walking program, daily HEP, use of appropriate foot wear. Pt verbalized understanding of all.   Follow Up Recommendations  No PT follow up;Other (comment)     Equipment Recommendations       Recommendations for Other Services       Precautions / Restrictions Precautions Precautions: Fall Precaution Comments: right foot drop Restrictions Weight Bearing Restrictions: No    Mobility  Bed Mobility               General bed mobility comments: in chair on arrival  Transfers Overall transfer level: Modified independent                  Ambulation/Gait Ambulation/Gait assistance: Min guard Ambulation Distance (Feet): 600 Feet Assistive device: None Gait Pattern/deviations: Step-through pattern;Steppage   Gait velocity interpretation: Below normal speed for age/gender General Gait Details: right foot drop with unsteady gait but no physical assist. AFO and lift not in room. Pt denied attempting cane and refuses to use RW at home. Agreeable to possibly using treking poles for walking outside. Cues for safety and education for walking program at home   Stairs            Wheelchair Mobility    Modified Rankin (Stroke Patients Only)       Balance Overall balance assessment: Needs assistance   Sitting  balance-Leahy Scale: Good       Standing balance-Leahy Scale: Fair                      Cognition Arousal/Alertness: Awake/alert Behavior During Therapy: WFL for tasks assessed/performed Overall Cognitive Status: Within Functional Limits for tasks assessed                      Exercises      General Comments        Pertinent Vitals/Pain Pain Assessment: No/denies pain  HR 76 sats 98% on RA    Home Living                      Prior Function            PT Goals (current goals can now be found in the care plan section) Progress towards PT goals: Progressing toward goals    Frequency       PT Plan Current plan remains appropriate    Co-evaluation             End of Session   Activity Tolerance: Patient tolerated treatment well Patient left: in chair;with call bell/phone within reach;with family/visitor present     Time: 1191-4782 PT Time Calculation (min) (ACUTE ONLY): 23 min  Charges:  $Gait Training: 8-22 mins $Self Care/Home Management: 8-22  G Codes:      Delorse Lek 10/16/14, 11:15 AM Delaney Meigs, PT [phone]

## 2014-10-09 NOTE — Progress Notes (Signed)
Subjective: Patient doing well today. Reports no complaints. Denies any SOB or wheezing. Does report mild non-productive cough. Urine output slowed after resuming home torsemide yesterday.  Objective: Vital signs in last 24 hours: Filed Vitals:   10/08/14 1536 10/08/14 2045 10/09/14 0258 10/09/14 0705  BP:  108/46 108/41 110/50  Pulse: 77 79 77 65  Temp:  98 F (36.7 C)  97.6 F (36.4 C)  TempSrc:  Oral  Oral  Resp:  21 20 18   Height:      Weight:    67.586 kg (149 lb)  SpO2: 98% 97% 96% 95%   Weight change: -1.287 kg (-2 lb 13.4 oz)  Intake/Output Summary (Last 24 hours) at 10/09/14 9833 Last data filed at 10/09/14 0300  Gross per 24 hour  Intake    730 ml  Output    775 ml  Net    -45 ml   Physical Exam GENERAL- alert, co-operative, appears as stated age, not in any distress. CARDIAC- RRR paced by pacemaker, no murmurs, rubs or gallops. RESP- Moving equal volumes of air, and clear to auscultation bilaterally, no wheezes, mild crackles at bilateral lung bases, improved from before.  ABDOMEN- Soft, nontender, no guarding or rebound, no palpable masses or organomegaly, bowel sounds present. EXTREMITIES- pulse 1+, symmetric, no pedal edema. SKIN- Warm, dry, No rash or lesion. PSYCH- Normal mood and affect, appropriate thought content and speech.  Lab Results: Basic Metabolic Panel:  Recent Labs Lab 10/08/14 0230 10/09/14 0333  NA 139 137  K 4.1 4.1  CL 97* 95*  CO2 32 34*  GLUCOSE 124* 97  BUN 33* 39*  CREATININE 1.95* 2.02*  CALCIUM 9.2 8.9   Liver Function Tests:  Recent Labs Lab 10/07/14 1300  AST 19  ALT 12*  ALKPHOS 69  BILITOT 0.7  PROT 6.6  ALBUMIN 3.9   CBC:  Recent Labs Lab 10/06/14 0130 10/07/14 1300 10/08/14 0230 10/09/14 0333  WBC 8.3 15.6* 8.5 9.8  NEUTROABS 6.7 14.8*  --   --   HGB 10.2* 11.3* 9.9* 9.8*  HCT 31.8* 35.0* 30.1* 31.0*  MCV 94.4 92.6 91.5 93.1  PLT 247 294 253 285   Cardiac Enzymes:  Recent Labs Lab  10/07/14 1300 10/08/14 0937  TROPONINI 0.03 0.04*   Coagulation:  Recent Labs Lab 10/06/14 0130 10/07/14 1300 10/08/14 0230 10/09/14 0333  LABPROT 26.5* 28.3* 26.6* 26.4*  INR 2.48* 2.71* 2.48* 2.47*   Urine Drug Screen: Drugs of Abuse     Component Value Date/Time   LABOPIA NONE DETECTED 03/26/2013 1408   COCAINSCRNUR NONE DETECTED 03/26/2013 1408   LABBENZ NONE DETECTED 03/26/2013 1408   AMPHETMU NONE DETECTED 03/26/2013 1408   THCU NONE DETECTED 03/26/2013 1408   LABBARB NONE DETECTED 03/26/2013 1408    Urinalysis:  Recent Labs Lab 10/07/14 1531  COLORURINE YELLOW  LABSPEC 1.007  PHURINE 6.5  GLUCOSEU NEGATIVE  HGBUR NEGATIVE  BILIRUBINUR NEGATIVE  KETONESUR NEGATIVE  PROTEINUR NEGATIVE  UROBILINOGEN 0.2  NITRITE NEGATIVE  LEUKOCYTESUR NEGATIVE   Micro Results: Recent Results (from the past 240 hour(s))  MRSA PCR Screening     Status: None   Collection Time: 10/07/14  5:18 PM  Result Value Ref Range Status   MRSA by PCR NEGATIVE NEGATIVE Final    Comment:        The GeneXpert MRSA Assay (FDA approved for NASAL specimens only), is one component of a comprehensive MRSA colonization surveillance program. It is not intended to diagnose MRSA infection nor to  guide or monitor treatment for MRSA infections.    Studies/Results: X-ray Chest Pa And Lateral  10/08/2014   CLINICAL DATA:  Shortness of breath, history of aortic valve replacement, CABG, permanent pacemaker placement, COPD with history of heavy smoking.  EXAM: CHEST  2 VIEW  COMPARISON:  Portable chest x-ray of October 07, 2014  FINDINGS: The lungs remain hyperinflated. The pulmonary interstitial markings have markedly improved. The cardiac silhouette is more normal in size today the prosthetic aortic valve ring is visible. The central pulmonary vascularity is less engorged. There remain small bilateral pleural effusions greater on the right than on the left. There 7 intact sternal wires. The permanent  pacemaker is in reasonable position. The bony thorax exhibits no acute abnormality.  IMPRESSION: Improving CHF superimposed upon COPD.  There is no pneumonia.   Electronically Signed   By: David  Martinique M.D.   On: 10/08/2014 07:45   Dg Chest Port 1 View  10/07/2014   CLINICAL DATA:  Shortness of breath.  EXAM: PORTABLE CHEST - 1 VIEW  COMPARISON:  October 06, 2014.  FINDINGS: Stable cardiomediastinal silhouette. Left-sided pacemaker is unchanged in position. Status post coronary artery bypass graft. No pneumothorax is noted. Stable small bilateral pleural effusions are noted. There appears to be increased left parahilar and right basilar interstitial opacities which may simply be due to radiographic technique, but increased pulmonary edema superimpose chronic scarring cannot be excluded. Rounded opacity seen in the region the right minor fissure most consistent with loculated fluid or pseudotumor. Bony thorax is intact.  IMPRESSION: Increased bilateral interstitial lung opacities are noted which may simply be due to radiographic technique, but increased pulmonary edema superimposed upon underlying chronic scarring cannot be excluded. Stable small bilateral pleural effusions are noted, with right greater than left. Stable small loculated effusion seen in right minor fissure.   Electronically Signed   By: Marijo Conception, M.D.   On: 10/07/2014 13:51   Medications: I have reviewed the patient's current medications. Scheduled Meds: . allopurinol  200 mg Oral Daily  . antiseptic oral rinse  7 mL Mouth Rinse q12n4p  . atorvastatin  40 mg Oral QPM  . carvedilol  6.25 mg Oral BID WC  . chlorhexidine  15 mL Mouth Rinse BID  . clopidogrel  75 mg Oral Daily  . docusate sodium  100 mg Oral BID  . DULoxetine  60 mg Oral Daily  . sodium chloride  3 mL Intravenous Q12H  . spironolactone  12.5 mg Oral Daily  . tamsulosin  0.8 mg Oral QHS  . torsemide  40 mg Oral q1800  . torsemide  60 mg Oral Q breakfast  .  warfarin  2 mg Oral q1800  . Warfarin - Pharmacist Dosing Inpatient   Does not apply q1800   Continuous Infusions:  PRN Meds:.acetaminophen **OR** acetaminophen, albuterol, polyethylene glycol Assessment/Plan: Principal Problem:   Acute on chronic combined systolic and diastolic congestive heart failure Active Problems:   Hypertension   CKD (chronic kidney disease), stage III   Tissue AVR 2006 (in [place])   PAF   Hyperlipidemia   Amiodarone pulmonary toxicity   COPD (chronic obstructive pulmonary disease)  Acute on Chronic combined systolic and diastolic CHF: Pt did well on 2L O2 overnight. 775 mL of output yesterday. No complaints of SOB, wheezing.  - Continue Torsemide regimen per HF team - Continue Coreg 6.25 mg BID - Continue Spironolactone 12.5 mg qdaily - Continue Clopidogrel 75 mg qdaily  - Cr 2.02 up from 1.95  yesterday - likely D/C later today  COPD: No acute COPD exacerbation. SOB 2/2 CHF exacerbation. Former smoker, O2 sat 92-96% on 2L. Stopped supplemental O2 this AM with his sat remaining 96%. - Stop O2 during the day, continue 2L O2 at night - albuterol nebs prn   HTN: BP has been in the 130-140s. Will continue home medications.  - See above  CKD (Chronic Kidney Disease) Stage III: eGFR 31, Cr 1.91, BUN 33 on admission. Cr stable at 2.03 today.  - Continue Torsemide regimen per HF team - Continue home spironolactone 12.5 mg qdaily - Allopurinol 213m qdaily (hx of gout)  PAF: INR 2.47 today. S/p bioprosthetic aortic valve replacement in 2006. - Continue warfarin 2 mg qdaily - Continue clopidogrel 75 mg qdaily  - Per pharmacy  HLD: On atorvastain 40 mg qdaily.  - Continue home meds  Depression/Anxiety: On Duloxetine 60 mg daily. - Continue home meds  DVT PPX: warfarin per pharmacy  Code status: DNR  Dispo: Ready for discharge today  The patient does have a current PCP (Marciano SequinM. CKarle Starch MD) and [facility]  [facility].  The patient does not have transportation limitations that hinder transportation to clinic appointments.   LOS: 2 days   NMaryellen Pile MD 10/09/2014, 8:33 AM Medicine attending discharge note: I personally interviewed and examined this patient this morning together with resident physician Dr. NMaryellen Pileand medical student Ms. Camilla Powierz and I attest to  the accuracy of the evaluation and discharge plan as recorded above. 81year old man with both coronary artery disease and obstructive airway disease. Stage III chronic renal insufficiency. Status post tissue aortic valve. Status post pacemaker. He initially presented with what was felt to be an exacerbation of his obstructive airway disease but due to progressive symptoms was reevaluated and felt most likely to be having an exacerbation of chronic congestive heart failure. Most recent echocardiogram done on 08/02/2014 showed ejection fraction 25-30 percent with diffuse hypokinesis and grade 2 diastolic dysfunction. Akinesis of the inferolateral myocardium. A bioprosthetic aortic valve with mild regurgitation was noted. He is already followed by our heart failure team. He was seen in consultation by Dr. DLoralie Champagne He was started on initial parenteral diuresis with furosemide and diuresed well. After 2 doses of IV Lasix, he which changed back to his oral torsemide at a slightly increased dose: He was taking 40 mg twice a day at home, he was increased to 60 mg in the morning and 40 in the afternoon. Coreg and spironolactone were continued. Warfarin continued for chronic anticoagulation prophylaxis in view of his underlying paroxysmal atrial fibrillation and cardiomyopathy. Renal function remained stable with increased diuretics. He had minimal basilar rales at discharge. He never had an S3 gallop or peripheral edema on admission. Initial chest radiograph did not show any acute congestion but a follow-up study done on July 12 did  show bilateral increased interstitial fluid. A follow-up study done on July 13 read as "improving CHF superimposed upon COPD". "The pulmonary interstitial markings have markedly improved" BNP on July 11, 971, on October 06, 1756.5. Peak troponin 0.04. EKG with a paced rhythm. Occasional PVC. No change from previous tracings.  Disposition: Condition stable at time of discharge. He will follow-up with his primary care physician as well as as the heart failure clinic here. His wife voiced concern that he is not getting enough diuretics or strong enough diuretics. I told her that his situation was complicated with both heart and  lung disease and that we have to proceed cautiously with medication changes. She will discuss potential substitution of torsemide with Bumex at time of cardiology follow-up.

## 2014-10-10 ENCOUNTER — Ambulatory Visit: Payer: Self-pay | Admitting: Neurology

## 2014-10-13 ENCOUNTER — Telehealth (HOSPITAL_COMMUNITY): Payer: Self-pay | Admitting: Surgery

## 2014-10-13 ENCOUNTER — Encounter (HOSPITAL_COMMUNITY): Payer: Medicare HMO

## 2014-10-13 NOTE — Telephone Encounter (Signed)
Heart Failure Nurse Navigator Post Discharge Telephone Call  I called to check on Timothy Durham since his recent hospitalization.  He tells me he is doing "fine" and has no questions related to his HF.  He has all medications and has been taking them as prescribed.  He denies any questions related to his discharge instructions.  We reviewed his upcoming AHF Clinic appt on 10/29/14 at 11:20am.  I encouraged him to call me back with any concerns or questions related to his HF.

## 2014-10-15 ENCOUNTER — Ambulatory Visit (HOSPITAL_COMMUNITY)
Admit: 2014-10-15 | Discharge: 2014-10-15 | Disposition: A | Discharge status: Home / Self Care | Payer: Medicare HMO | Source: Ambulatory Visit | Attending: Internal Medicine | Admitting: Internal Medicine

## 2014-10-15 DIAGNOSIS — I5022 Chronic systolic (congestive) heart failure: Secondary | ICD-10-CM | POA: Diagnosis not present

## 2014-10-15 LAB — BASIC METABOLIC PANEL
Anion gap: 8 (ref 5–15)
BUN: 37 mg/dL — AB (ref 6–20)
CO2: 33 mmol/L — ABNORMAL HIGH (ref 22–32)
Calcium: 9.3 mg/dL (ref 8.9–10.3)
Chloride: 98 mmol/L — ABNORMAL LOW (ref 101–111)
Creatinine, Ser: 1.96 mg/dL — ABNORMAL HIGH (ref 0.61–1.24)
GFR calc Af Amer: 35 mL/min — ABNORMAL LOW (ref >60)
GFR calc non Af Amer: 30 mL/min — ABNORMAL LOW (ref >60)
GLUCOSE: 107 mg/dL — AB (ref 65–99)
Potassium: 4.1 mmol/L (ref 3.5–5.1)
SODIUM: 139 mmol/L (ref 135–145)

## 2014-10-22 ENCOUNTER — Telehealth: Payer: Self-pay | Admitting: Pulmonary Disease

## 2014-10-22 DIAGNOSIS — G4733 Obstructive sleep apnea (adult) (pediatric): Secondary | ICD-10-CM

## 2014-10-22 NOTE — Telephone Encounter (Signed)
Split study -lab

## 2014-10-22 NOTE — Telephone Encounter (Signed)
Patient Instructions     Sleep study will be scheduled Breathing test shows lung capacity at 67% - from smoking -'COPD' Good luck with cardiac rehab   When pt was here on 09/25/14 RA wanted him to a sleep study. This was not ordered.  RA - please advise what kind of sleep study you want scheduled. Thanks.

## 2014-10-23 NOTE — Telephone Encounter (Signed)
ATC x 2, unable to get through to patient.  wcb

## 2014-10-24 NOTE — Telephone Encounter (Signed)
Called and spoke to pt's wife. Informed her of the sleep study order. Pt's wife stated she will inform pt of the order and someone ill contact pt to schedule study. Pt's wife verbalized understanding and denied any further questions or concerns at this time.

## 2014-10-29 ENCOUNTER — Telehealth (HOSPITAL_COMMUNITY): Payer: Self-pay | Admitting: Cardiac Rehabilitation

## 2014-10-29 ENCOUNTER — Ambulatory Visit (HOSPITAL_COMMUNITY)
Admission: RE | Admit: 2014-10-29 | Discharge: 2014-10-29 | Disposition: A | Discharge status: Home / Self Care | Payer: Medicare HMO | Source: Ambulatory Visit | Attending: Internal Medicine | Admitting: Internal Medicine

## 2014-10-29 VITALS — BP 110/52 | HR 95 | Wt 149.8 lb

## 2014-10-29 DIAGNOSIS — R918 Other nonspecific abnormal finding of lung field: Secondary | ICD-10-CM | POA: Diagnosis not present

## 2014-10-29 DIAGNOSIS — Z9981 Dependence on supplemental oxygen: Secondary | ICD-10-CM | POA: Insufficient documentation

## 2014-10-29 DIAGNOSIS — I480 Paroxysmal atrial fibrillation: Secondary | ICD-10-CM | POA: Diagnosis not present

## 2014-10-29 DIAGNOSIS — J708 Respiratory conditions due to other specified external agents: Secondary | ICD-10-CM | POA: Diagnosis not present

## 2014-10-29 DIAGNOSIS — Z953 Presence of xenogenic heart valve: Secondary | ICD-10-CM | POA: Diagnosis not present

## 2014-10-29 DIAGNOSIS — I2510 Atherosclerotic heart disease of native coronary artery without angina pectoris: Secondary | ICD-10-CM | POA: Insufficient documentation

## 2014-10-29 DIAGNOSIS — I129 Hypertensive chronic kidney disease with stage 1 through stage 4 chronic kidney disease, or unspecified chronic kidney disease: Secondary | ICD-10-CM | POA: Diagnosis not present

## 2014-10-29 DIAGNOSIS — J449 Chronic obstructive pulmonary disease, unspecified: Secondary | ICD-10-CM | POA: Diagnosis not present

## 2014-10-29 DIAGNOSIS — K219 Gastro-esophageal reflux disease without esophagitis: Secondary | ICD-10-CM | POA: Insufficient documentation

## 2014-10-29 DIAGNOSIS — I739 Peripheral vascular disease, unspecified: Secondary | ICD-10-CM | POA: Insufficient documentation

## 2014-10-29 DIAGNOSIS — Z79899 Other long term (current) drug therapy: Secondary | ICD-10-CM | POA: Diagnosis not present

## 2014-10-29 DIAGNOSIS — N183 Chronic kidney disease, stage 3 unspecified: Secondary | ICD-10-CM

## 2014-10-29 DIAGNOSIS — E785 Hyperlipidemia, unspecified: Secondary | ICD-10-CM | POA: Insufficient documentation

## 2014-10-29 DIAGNOSIS — I429 Cardiomyopathy, unspecified: Secondary | ICD-10-CM | POA: Diagnosis not present

## 2014-10-29 DIAGNOSIS — T462X5A Adverse effect of other antidysrhythmic drugs, initial encounter: Secondary | ICD-10-CM

## 2014-10-29 DIAGNOSIS — I5022 Chronic systolic (congestive) heart failure: Secondary | ICD-10-CM

## 2014-10-29 DIAGNOSIS — Z8673 Personal history of transient ischemic attack (TIA), and cerebral infarction without residual deficits: Secondary | ICD-10-CM | POA: Diagnosis not present

## 2014-10-29 DIAGNOSIS — Z87891 Personal history of nicotine dependence: Secondary | ICD-10-CM | POA: Diagnosis not present

## 2014-10-29 DIAGNOSIS — J984 Other disorders of lung: Secondary | ICD-10-CM

## 2014-10-29 DIAGNOSIS — Z950 Presence of cardiac pacemaker: Secondary | ICD-10-CM | POA: Diagnosis not present

## 2014-10-29 DIAGNOSIS — Z7901 Long term (current) use of anticoagulants: Secondary | ICD-10-CM | POA: Insufficient documentation

## 2014-10-29 LAB — BASIC METABOLIC PANEL
Anion gap: 10 (ref 5–15)
BUN: 42 mg/dL — AB (ref 6–20)
CALCIUM: 9.2 mg/dL (ref 8.9–10.3)
CHLORIDE: 98 mmol/L — AB (ref 101–111)
CO2: 30 mmol/L (ref 22–32)
Creatinine, Ser: 1.96 mg/dL — ABNORMAL HIGH (ref 0.61–1.24)
GFR calc Af Amer: 35 mL/min — ABNORMAL LOW (ref >60)
GFR calc non Af Amer: 30 mL/min — ABNORMAL LOW (ref >60)
Glucose, Bld: 129 mg/dL — ABNORMAL HIGH (ref 65–99)
Potassium: 3.8 mmol/L (ref 3.5–5.1)
SODIUM: 138 mmol/L (ref 135–145)

## 2014-10-29 LAB — BRAIN NATRIURETIC PEPTIDE: B Natriuretic Peptide: 1266.9 pg/mL — ABNORMAL HIGH (ref 0.0–100.0)

## 2014-10-29 MED ORDER — EPLERENONE 25 MG PO TABS: 25.0000 mg | ORAL_TABLET | Freq: Every day | ORAL | Status: AC

## 2014-10-29 MED ORDER — TORSEMIDE 20 MG PO TABS: 60.0000 mg | ORAL_TABLET | Freq: Two times a day (BID) | ORAL | Status: AC

## 2014-10-29 MED ORDER — ALBUTEROL SULFATE HFA 108 (90 BASE) MCG/ACT IN AERS: 2.0000 | INHALATION_SPRAY | Freq: Four times a day (QID) | RESPIRATORY_TRACT | Status: AC | PRN

## 2014-10-29 NOTE — Telephone Encounter (Signed)
-----   Message from Laurey Morale, MD sent at 10/28/2014  3:16 PM EDT ----- Regarding: RE: cardiac rehab yes ----- Message -----    From: Robyne Peers, RN    Sent: 10/28/2014   2:33 PM      To: Laurey Morale, MD Subject: cardiac rehab                                  Dear Dr. Shirlee Latch,  Pt is scheduled to begin cardiac rehab 11/10/2014.  Pt uses home oxygen at bedtime.  Is it ok to use oxygen as needed and titrate to keep O2 sats >88% with exercise?    Thank you, Deveron Furlong, RN, BSN Cardiac Pulmonary Rehab

## 2014-10-29 NOTE — Progress Notes (Signed)
Patient ID: Timothy Durham, male   DOB: Oct 07, 1930, 81 y.o.   MRN: [id number] PCP: Dr Luiz Iron Primary Cardiologist: Dr. Ladona Ridgel  HPI: [name] is an 81 yo male with a history of prior CVA in 2000 secondary to AF, nonischemic cardiomoyopathy s/p Medtronic CRT-P (02/17/14), chronic systolic HF, non-obstructive CAD, HTN, CKD stage III, HLD, PAD and bioprosthetic AVR.   Admitted to [facility] 2/7 through 05/06/14 with volume overload. Diuresed with IV lasix and later transitioned to 40 mg torsemide in am and 20 mg in pm. Also started on home oxygen at night. Discharge weight was 143 pounds.    He was admitted in 5/16 with dyspnea, fever, chills and found to have PNA on CXR and multifocal airspace disease on CT.  He require oxygen.  There was concern for aspiration PNA versus amiodarone lung toxicity (organizing PNA related to amiodarone).  Amiodarone was stopped and he was put on prednisone, now off.  His breathing and oxygen saturation gradually improved.  While in the hospital, he had TIAs.  He had been off Plavix for possible bronchoscopy, which ended up not being done.  He had carotid dopplers and was evaluated by vascular who thought that the TIAs were not likely to be related to carotid stenosis (likely related to atrial fibrillation history). He is now back on Plavix and warfarin.  Hydralazine/Imdur were stopped to promote higher BP in the setting of TIAs.   He was admitted in 7/16 with acute/chronic systolic CHF and diuresed.    Compared to last appointment, weight is down 5 lbs.  He still gets short of breath walking any further than from room to room of his house.  On Sunday, he was short of breath with minimal activity, but this improved to baseline.  No chest pain.  No orthopnea/PND.  No lightheadedness or syncope.  He has some soreness below the nipples in the breasts bilaterally.     Optivol: fluid index > threshold at time of 7/16 admission, now < threshold but trending up with impedance trending down.    Studies: RHC (01/14/2014): RA 12, RV 42/11 (16), PA 42/23 (25), PCWP 17, PA 61%, Fick CO/CI: 4.53/2.27 LHC (01/14/1014): moderate non-obstructive CAD with 50% CFX  Echo (12/2013): EF 30-35%, grade III DD, AV bioprosthetic valve nl, V max: 0.96 cm^2, mod/severe MR, RV sys. fx moderately reduced, TR mild Carotid dopplers (5/16) with 40-60% left carotid stenosis, < 40% right carotid stenosis (CEA side).   Labs: 03/10/2014  K 5.8 Cr 1.68 04/08/2014  K 4.0 Cr 1.60 Na 136 05/06/2014 K 3.3 Creatinine 2.08  05/12/2014 K 4.1 Creatinine 2.1  5/16 LDL 80, HCT 29.3, K 3.9, creatinine 1.59 6/16 BNP 1481, K 4.4, creatinine 1.71, HCT 31.3 7/16 K 4.1, creatinine 1.06  ROS: All systems negative except as listed in HPI, PMH and Problem List.  SH:  History   Social History  . Marital Status: Married    Spouse Name: N/A  . Number of Children: N/A  . Years of Education: N/A   Occupational History  . Not on file.   Social History Main Topics  . Smoking status: Former Smoker -- 2.00 packs/day for 35 years    Types: Cigarettes    Quit date: 09/26/1998  . Smokeless tobacco: Never Used  . Alcohol Use: 0.6 oz/week    1 Cans of beer per week     Comment: drinks one can of beer or wine daily.  . Drug Use: No  . Sexual Activity: Not on file  Other Topics Concern  . Not on file   Social History Narrative   -lives with wife in [place] (09/2014)    FH:  Family History  Problem Relation Age of Onset  . Stroke Mother   . Cancer Father     Past Medical History  Diagnosis Date  . Arthritis   . Non-obstructive CAD     a. 12/2013 NSTEMI/Cath: LM nl, LAD 20, LCX 40-50, RCA 20.  Marland Kitchen Hypertension   . Stroke     a. July 2000;  b. January 2007;  c. TIA in 2014.  . CKD (chronic kidney disease), stage III   . GERD (gastroesophageal reflux disease)   . Foot pain, bilateral   . H/O hematuria   . Hyperlipidemia   . Vitamin D deficiency   . Chronic combined systolic and diastolic CHF (congestive  heart failure)     a. 12/2013 Echo: EF 30-35%, mod conc LVH, Gr 3 DD, mild AI, mod-sev MR, mod dil LA, mild TR.  Marland Kitchen PAF (paroxysmal atrial fibrillation)     a. CHA2DS2VASc = 7--> Chronic Coumadin.  Marland Kitchen NICM (nonischemic cardiomyopathy)     a. 12/2013 Echo: EF 30-35%.  . Aortic valve prosthesis present     a. 2006: S/P bioprosthetic AVR.  Marland Kitchen PAD (peripheral artery disease)     a. s/p LLE stenting.  . Carotid arterial disease     a. s/p R CEA  . History of tobacco abuse   . Moderate to Severe Mitral Regurgitation     a. 12/2013 Echo: Mod-Sev MR.  Marland Kitchen Anxiety   . COPD (chronic obstructive pulmonary disease)   . Coronary arteriosclerosis Oct 2015    med Rx  . Shortness of breath dyspnea   . Depression   . Presence of permanent cardiac pacemaker   . Heart murmur   . Dysrhythmia     Current Outpatient Prescriptions  Medication Sig Dispense Refill  . acetaminophen (TYLENOL) 500 MG tablet Take 1,000 mg by mouth at bedtime.     Marland Kitchen albuterol (PROVENTIL HFA;VENTOLIN HFA) 108 (90 BASE) MCG/ACT inhaler Inhale 2 puffs into the lungs every 6 (six) hours as needed for wheezing or shortness of breath. 1 Inhaler 3  . allopurinol (ZYLOPRIM) 100 MG tablet Take 200 mg by mouth daily.    Marland Kitchen atorvastatin (LIPITOR) 40 MG tablet Take 40 mg by mouth every evening.    . Calcium Carb-Cholecalciferol (CALCIUM 600 + D PO) Take 2 tablets by mouth every morning.    . carvedilol (COREG) 6.25 MG tablet Take 1 tablet (6.25 mg total) by mouth 2 (two) times daily with a meal. 60 tablet 3  . clopidogrel (PLAVIX) 75 MG tablet Take 75 mg by mouth daily.    . DULoxetine (CYMBALTA) 60 MG capsule Take 60 mg by mouth daily.     . Omega-3 Fatty Acids (FISH OIL) 1200 MG CAPS Take 1 capsule by mouth every morning.    Marland Kitchen omeprazole (PRILOSEC) 40 MG capsule Take 40 mg by mouth every morning.    . potassium chloride SA (K-DUR,KLOR-CON) 20 MEQ tablet Take 1 tablet (20 mEq total) by mouth as needed. TAKE ONLY WHEN YOU TAKE METOLAZONE 30  tablet 3  . tamsulosin (FLOMAX) 0.4 MG CAPS capsule Take 2 capsules (0.8 mg total) by mouth at bedtime. 60 capsule 0  . torsemide (DEMADEX) 20 MG tablet Take 3 tablets (60 mg total) by mouth 2 (two) times daily. 60 mg in the morning, 40 mg in the evening 180 tablet  3  . warfarin (COUMADIN) 2 MG tablet Take 2 mg by mouth daily.    Marland Kitchen eplerenone (INSPRA) 25 MG tablet Take 1 tablet (25 mg total) by mouth daily. 30 tablet 3   No current facility-administered medications for this encounter.    Filed Vitals:   10/29/14 1129  BP: 110/52  Pulse: 95  Weight: 149 lb 12.8 oz (67.949 kg)  SpO2: 93%   PHYSICAL EXAM: General:  Elderly appearing. No resp difficulty Wife present  HEENT: normal Neck: supple. JVP 8 cm, +HJR. Carotids 2+ bilaterally; no bruits. No lymphadenopathy or thryomegaly appreciated. Cor: PMI normal. Regular rate & rhythm. No rubs, gallops. 2/6 HSM LLSB/apex Lungs: clear  Abdomen: soft, nontender, nondistended. No hepatosplenomegaly. No bruits or masses. Good bowel sounds. Extremities: no cyanosis, clubbing, rash.  No edema.  Neuro: alert & orientedx3, cranial nerves grossly intact. Moves all 4 extremities w/o difficulty. Affect pleasant Skin: Tenderness/nodule at nipples bilaterally.   ASSESSMENT & PLAN: 1) Chronic systolic CHF: Nonischemic cardiomyopathy s/p Medtronic CRT-P.  Echo with EF 30-35%, grade III DD, mod/severe MR, RV sys fx mod reduced (12/2013). NYHA III symptoms.  He has some (probably mild) volume overload by exam and Optivol.  - Continue Coreg 6.25 mg bid.  - Suspect painful gynecomastia from spironolactone.  Stop spironolactone, wait 4-5 days, then start on eplerenone 25 mg daily.   - Increase torsemide to 80 qam/60 qpm x 2 days, then 60 mg bid after that.  BMET/BNP today and in 10 days.  - No ACEI for now with CKD.  - Refer for cardiac rehab. - He is interested in the Cardiomems device given multiple admissions with volume overload.  We will see if his  insurance will cover it and will given him the information to read about it.    2) PAF:  CHADVASC score = 5. NSR by exam today. Off amiodarone with possible lung toxicity.  On coumadin and plavix daily (had TIAs on coumadin alone).   3) CKD stage III: Check BMET today and in 10 days.    4) Bioprosthetic AVR: Stable on recent echo. Needs endocarditis prophylaxis with dental work.  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  Completed prednisone.  Not using oxygen during the day.   6) TIA: 07/2014. Continue statin and plavix.  7) Possible OSA: Sleep study has been ordered.   Marca Ancona  10/29/2014

## 2014-10-29 NOTE — Patient Instructions (Addendum)
STOP Spironolactone.  START Eplerenone 25mg  (1 tablet) daily in 4 DAYS ON 11/01/2014.  TAKE Torsemide 80mg  am and 60mg  pm for 2 days....then take 60mg  twice a day.   LABS today (bmet bnp)  LABS in 10 days.  FOLLOW UP in 1 month.

## 2014-10-30 ENCOUNTER — Telehealth (HOSPITAL_COMMUNITY): Payer: Self-pay | Admitting: *Deleted

## 2014-10-30 NOTE — Telephone Encounter (Signed)
Called to pt to give results from labs.  Pt wife said that he was feeling congested, weak, and exhausted.  No chest pain, SOB, or edema.  BP is 96/62 sitting, and 93/63 standing.   Spoke with dr Shirlee Latch and he said to hold their evening dose and see how he feels in the am.

## 2014-11-04 ENCOUNTER — Inpatient Hospital Stay (HOSPITAL_BASED_OUTPATIENT_CLINIC_OR_DEPARTMENT_OTHER)
Admission: EM | Admit: 2014-11-04 | Discharge: 2014-11-27 | DRG: 291 | Disposition: E | Payer: Medicare HMO | Attending: Internal Medicine | Admitting: Internal Medicine

## 2014-11-04 ENCOUNTER — Encounter (HOSPITAL_BASED_OUTPATIENT_CLINIC_OR_DEPARTMENT_OTHER): Payer: Self-pay

## 2014-11-04 ENCOUNTER — Inpatient Hospital Stay (HOSPITAL_COMMUNITY): Payer: Medicare HMO

## 2014-11-04 ENCOUNTER — Telehealth: Payer: Self-pay | Admitting: Pulmonary Disease

## 2014-11-04 ENCOUNTER — Emergency Department (HOSPITAL_BASED_OUTPATIENT_CLINIC_OR_DEPARTMENT_OTHER): Payer: Medicare HMO

## 2014-11-04 DIAGNOSIS — I2510 Atherosclerotic heart disease of native coronary artery without angina pectoris: Secondary | ICD-10-CM | POA: Diagnosis present

## 2014-11-04 DIAGNOSIS — J449 Chronic obstructive pulmonary disease, unspecified: Secondary | ICD-10-CM | POA: Diagnosis present

## 2014-11-04 DIAGNOSIS — J441 Chronic obstructive pulmonary disease with (acute) exacerbation: Secondary | ICD-10-CM | POA: Diagnosis present

## 2014-11-04 DIAGNOSIS — N183 Chronic kidney disease, stage 3 (moderate): Secondary | ICD-10-CM | POA: Diagnosis present

## 2014-11-04 DIAGNOSIS — I252 Old myocardial infarction: Secondary | ICD-10-CM | POA: Diagnosis not present

## 2014-11-04 DIAGNOSIS — M109 Gout, unspecified: Secondary | ICD-10-CM | POA: Diagnosis present

## 2014-11-04 DIAGNOSIS — J189 Pneumonia, unspecified organism: Secondary | ICD-10-CM | POA: Diagnosis present

## 2014-11-04 DIAGNOSIS — R0902 Hypoxemia: Secondary | ICD-10-CM

## 2014-11-04 DIAGNOSIS — Z66 Do not resuscitate: Secondary | ICD-10-CM | POA: Diagnosis present

## 2014-11-04 DIAGNOSIS — F329 Major depressive disorder, single episode, unspecified: Secondary | ICD-10-CM | POA: Diagnosis present

## 2014-11-04 DIAGNOSIS — I509 Heart failure, unspecified: Secondary | ICD-10-CM

## 2014-11-04 DIAGNOSIS — Z952 Presence of prosthetic heart valve: Secondary | ICD-10-CM

## 2014-11-04 DIAGNOSIS — Z9289 Personal history of other medical treatment: Secondary | ICD-10-CM

## 2014-11-04 DIAGNOSIS — R0600 Dyspnea, unspecified: Secondary | ICD-10-CM

## 2014-11-04 DIAGNOSIS — Z885 Allergy status to narcotic agent status: Secondary | ICD-10-CM

## 2014-11-04 DIAGNOSIS — Z7901 Long term (current) use of anticoagulants: Secondary | ICD-10-CM | POA: Diagnosis not present

## 2014-11-04 DIAGNOSIS — N179 Acute kidney failure, unspecified: Secondary | ICD-10-CM | POA: Diagnosis not present

## 2014-11-04 DIAGNOSIS — Z823 Family history of stroke: Secondary | ICD-10-CM | POA: Diagnosis not present

## 2014-11-04 DIAGNOSIS — J9600 Acute respiratory failure, unspecified whether with hypoxia or hypercapnia: Secondary | ICD-10-CM | POA: Diagnosis present

## 2014-11-04 DIAGNOSIS — Z8673 Personal history of transient ischemic attack (TIA), and cerebral infarction without residual deficits: Secondary | ICD-10-CM

## 2014-11-04 DIAGNOSIS — Z87891 Personal history of nicotine dependence: Secondary | ICD-10-CM | POA: Diagnosis not present

## 2014-11-04 DIAGNOSIS — I5021 Acute systolic (congestive) heart failure: Secondary | ICD-10-CM | POA: Diagnosis not present

## 2014-11-04 DIAGNOSIS — Z515 Encounter for palliative care: Secondary | ICD-10-CM | POA: Diagnosis not present

## 2014-11-04 DIAGNOSIS — Z791 Long term (current) use of non-steroidal anti-inflammatories (NSAID): Secondary | ICD-10-CM | POA: Diagnosis not present

## 2014-11-04 DIAGNOSIS — I739 Peripheral vascular disease, unspecified: Secondary | ICD-10-CM | POA: Diagnosis present

## 2014-11-04 DIAGNOSIS — Z7902 Long term (current) use of antithrombotics/antiplatelets: Secondary | ICD-10-CM

## 2014-11-04 DIAGNOSIS — I429 Cardiomyopathy, unspecified: Secondary | ICD-10-CM | POA: Diagnosis present

## 2014-11-04 DIAGNOSIS — I480 Paroxysmal atrial fibrillation: Secondary | ICD-10-CM | POA: Diagnosis present

## 2014-11-04 DIAGNOSIS — R791 Abnormal coagulation profile: Secondary | ICD-10-CM | POA: Diagnosis present

## 2014-11-04 DIAGNOSIS — I129 Hypertensive chronic kidney disease with stage 1 through stage 4 chronic kidney disease, or unspecified chronic kidney disease: Secondary | ICD-10-CM | POA: Diagnosis present

## 2014-11-04 DIAGNOSIS — J9601 Acute respiratory failure with hypoxia: Secondary | ICD-10-CM | POA: Diagnosis present

## 2014-11-04 DIAGNOSIS — Z880 Allergy status to penicillin: Secondary | ICD-10-CM | POA: Diagnosis not present

## 2014-11-04 DIAGNOSIS — Z950 Presence of cardiac pacemaker: Secondary | ICD-10-CM | POA: Diagnosis not present

## 2014-11-04 DIAGNOSIS — R0602 Shortness of breath: Secondary | ICD-10-CM | POA: Diagnosis present

## 2014-11-04 DIAGNOSIS — I340 Nonrheumatic mitral (valve) insufficiency: Secondary | ICD-10-CM | POA: Diagnosis present

## 2014-11-04 DIAGNOSIS — Z79899 Other long term (current) drug therapy: Secondary | ICD-10-CM | POA: Diagnosis not present

## 2014-11-04 DIAGNOSIS — E785 Hyperlipidemia, unspecified: Secondary | ICD-10-CM | POA: Diagnosis present

## 2014-11-04 DIAGNOSIS — Z888 Allergy status to other drugs, medicaments and biological substances status: Secondary | ICD-10-CM | POA: Diagnosis not present

## 2014-11-04 DIAGNOSIS — Z882 Allergy status to sulfonamides status: Secondary | ICD-10-CM | POA: Diagnosis not present

## 2014-11-04 DIAGNOSIS — I214 Non-ST elevation (NSTEMI) myocardial infarction: Secondary | ICD-10-CM

## 2014-11-04 DIAGNOSIS — I5043 Acute on chronic combined systolic (congestive) and diastolic (congestive) heart failure: Secondary | ICD-10-CM | POA: Diagnosis present

## 2014-11-04 DIAGNOSIS — K219 Gastro-esophageal reflux disease without esophagitis: Secondary | ICD-10-CM | POA: Diagnosis present

## 2014-11-04 DIAGNOSIS — Z951 Presence of aortocoronary bypass graft: Secondary | ICD-10-CM | POA: Diagnosis not present

## 2014-11-04 DIAGNOSIS — D638 Anemia in other chronic diseases classified elsewhere: Secondary | ICD-10-CM | POA: Diagnosis present

## 2014-11-04 DIAGNOSIS — J810 Acute pulmonary edema: Secondary | ICD-10-CM | POA: Diagnosis not present

## 2014-11-04 DIAGNOSIS — F419 Anxiety disorder, unspecified: Secondary | ICD-10-CM | POA: Diagnosis present

## 2014-11-04 DIAGNOSIS — Z9981 Dependence on supplemental oxygen: Secondary | ICD-10-CM

## 2014-11-04 DIAGNOSIS — I5023 Acute on chronic systolic (congestive) heart failure: Secondary | ICD-10-CM | POA: Diagnosis not present

## 2014-11-04 DIAGNOSIS — R0989 Other specified symptoms and signs involving the circulatory and respiratory systems: Secondary | ICD-10-CM

## 2014-11-04 LAB — COMPREHENSIVE METABOLIC PANEL
ALT: 11 U/L — AB (ref 17–63)
ANION GAP: 12 (ref 5–15)
AST: 19 U/L (ref 15–41)
Albumin: 3.8 g/dL (ref 3.5–5.0)
Alkaline Phosphatase: 80 U/L (ref 38–126)
BILIRUBIN TOTAL: 1.1 mg/dL (ref 0.3–1.2)
BUN: 42 mg/dL — ABNORMAL HIGH (ref 6–20)
CALCIUM: 9.4 mg/dL (ref 8.9–10.3)
CO2: 30 mmol/L (ref 22–32)
CREATININE: 1.84 mg/dL — AB (ref 0.61–1.24)
Chloride: 96 mmol/L — ABNORMAL LOW (ref 101–111)
GFR calc Af Amer: 38 mL/min — ABNORMAL LOW (ref >60)
GFR, EST NON AFRICAN AMERICAN: 32 mL/min — AB (ref >60)
Glucose, Bld: 123 mg/dL — ABNORMAL HIGH (ref 65–99)
Potassium: 4 mmol/L (ref 3.5–5.1)
SODIUM: 138 mmol/L (ref 135–145)
Total Protein: 7.2 g/dL (ref 6.5–8.1)

## 2014-11-04 LAB — CBC WITH DIFFERENTIAL/PLATELET
BASOS ABS: 0 10*3/uL (ref 0.0–0.1)
Basophils Relative: 0 % (ref 0–1)
EOS ABS: 0.5 10*3/uL (ref 0.0–0.7)
Eosinophils Relative: 7 % — ABNORMAL HIGH (ref 0–5)
HCT: 35.8 % — ABNORMAL LOW (ref 39.0–52.0)
HEMOGLOBIN: 11.6 g/dL — AB (ref 13.0–17.0)
LYMPHS PCT: 8 % — AB (ref 12–46)
Lymphs Abs: 0.7 10*3/uL (ref 0.7–4.0)
MCH: 30.4 pg (ref 26.0–34.0)
MCHC: 32.4 g/dL (ref 30.0–36.0)
MCV: 93.7 fL (ref 78.0–100.0)
MONO ABS: 0.6 10*3/uL (ref 0.1–1.0)
MONOS PCT: 8 % (ref 3–12)
NEUTROS ABS: 6.4 10*3/uL (ref 1.7–7.7)
NEUTROS PCT: 77 % (ref 43–77)
Platelets: 351 10*3/uL (ref 150–400)
RBC: 3.82 MIL/uL — ABNORMAL LOW (ref 4.22–5.81)
RDW: 16.5 % — ABNORMAL HIGH (ref 11.5–15.5)
WBC: 8.2 10*3/uL (ref 4.0–10.5)

## 2014-11-04 LAB — PROTIME-INR
INR: 1.66 — ABNORMAL HIGH (ref 0.00–1.49)
Prothrombin Time: 19.6 seconds — ABNORMAL HIGH (ref 11.6–15.2)

## 2014-11-04 LAB — I-STAT CG4 LACTIC ACID, ED: LACTIC ACID, VENOUS: 1.57 mmol/L (ref 0.5–2.0)

## 2014-11-04 LAB — BRAIN NATRIURETIC PEPTIDE: B Natriuretic Peptide: 1596.1 pg/mL — ABNORMAL HIGH (ref 0.0–100.0)

## 2014-11-04 LAB — TROPONIN I: Troponin I: 0.04 ng/mL — ABNORMAL HIGH (ref <0.031)

## 2014-11-04 LAB — MRSA PCR SCREENING: MRSA by PCR: NEGATIVE

## 2014-11-04 MED ORDER — IPRATROPIUM BROMIDE 0.02 % IN SOLN
0.5000 mg | Freq: Once | RESPIRATORY_TRACT | Status: AC
Start: 2014-11-04 — End: 2014-11-04
  Administered 2014-11-04: 0.5 mg via RESPIRATORY_TRACT
  Filled 2014-11-04: qty 2.5

## 2014-11-04 MED ORDER — ALLOPURINOL 100 MG PO TABS
200.0000 mg | ORAL_TABLET | Freq: Every day | ORAL | Status: DC
  Administered 2014-11-05 – 2014-11-07 (×3): 200 mg via ORAL
  Filled 2014-11-04 (×3): qty 2

## 2014-11-04 MED ORDER — CEFTAZIDIME 1 G IJ SOLR
INTRAMUSCULAR | Status: AC
  Filled 2014-11-04: qty 1

## 2014-11-04 MED ORDER — SODIUM CHLORIDE 0.9 % IJ SOLN
3.0000 mL | Freq: Two times a day (BID) | INTRAMUSCULAR | Status: DC
  Administered 2014-11-05 – 2014-11-07 (×7): 3 mL via INTRAVENOUS

## 2014-11-04 MED ORDER — DEXTROSE 5 % IV SOLN
1.0000 g | Freq: Once | INTRAVENOUS | Status: AC
  Administered 2014-11-04: 1 g via INTRAVENOUS

## 2014-11-04 MED ORDER — DULOXETINE HCL 60 MG PO CPEP
60.0000 mg | ORAL_CAPSULE | Freq: Every day | ORAL | Status: DC
  Administered 2014-11-05: 60 mg via ORAL
  Filled 2014-11-04 (×2): qty 1

## 2014-11-04 MED ORDER — ENOXAPARIN SODIUM 80 MG/0.8ML ~~LOC~~ SOLN
1.0000 mg/kg | Freq: Once | SUBCUTANEOUS | Status: AC
  Administered 2014-11-04: 65 mg via SUBCUTANEOUS
  Filled 2014-11-04: qty 0.8

## 2014-11-04 MED ORDER — DEXTROSE 5 % IV SOLN: 1.0000 g | INTRAVENOUS | Status: DC

## 2014-11-04 MED ORDER — PREDNISONE 20 MG PO TABS
40.0000 mg | ORAL_TABLET | Freq: Every day | ORAL | Status: DC
  Administered 2014-11-05: 40 mg via ORAL
  Filled 2014-11-04 (×2): qty 2

## 2014-11-04 MED ORDER — FUROSEMIDE 10 MG/ML IJ SOLN
40.0000 mg | Freq: Once | INTRAMUSCULAR | Status: AC
  Administered 2014-11-05: 40 mg via INTRAVENOUS
  Filled 2014-11-04: qty 4

## 2014-11-04 MED ORDER — CARVEDILOL 6.25 MG PO TABS
6.2500 mg | ORAL_TABLET | Freq: Two times a day (BID) | ORAL | Status: DC
  Filled 2014-11-04 (×2): qty 1

## 2014-11-04 MED ORDER — ALBUTEROL (5 MG/ML) CONTINUOUS INHALATION SOLN
10.0000 mg/h | INHALATION_SOLUTION | RESPIRATORY_TRACT | Status: DC
  Administered 2014-11-04: 10 mg/h via RESPIRATORY_TRACT
  Filled 2014-11-04: qty 20

## 2014-11-04 MED ORDER — CLOPIDOGREL BISULFATE 75 MG PO TABS
75.0000 mg | ORAL_TABLET | Freq: Every day | ORAL | Status: DC
  Administered 2014-11-05 – 2014-11-07 (×3): 75 mg via ORAL
  Filled 2014-11-04 (×3): qty 1

## 2014-11-04 MED ORDER — IPRATROPIUM-ALBUTEROL 0.5-2.5 (3) MG/3ML IN SOLN
3.0000 mL | RESPIRATORY_TRACT | Status: DC | PRN
  Administered 2014-11-05: 3 mL via RESPIRATORY_TRACT
  Filled 2014-11-04: qty 3

## 2014-11-04 MED ORDER — ATORVASTATIN CALCIUM 40 MG PO TABS
40.0000 mg | ORAL_TABLET | Freq: Every evening | ORAL | Status: DC
  Administered 2014-11-05 – 2014-11-07 (×3): 40 mg via ORAL
  Filled 2014-11-04 (×3): qty 1

## 2014-11-04 MED ORDER — METHYLPREDNISOLONE SODIUM SUCC 125 MG IJ SOLR
125.0000 mg | Freq: Once | INTRAMUSCULAR | Status: AC
  Administered 2014-11-04: 125 mg via INTRAVENOUS
  Filled 2014-11-04: qty 2

## 2014-11-04 MED ORDER — PANTOPRAZOLE SODIUM 40 MG PO TBEC
40.0000 mg | DELAYED_RELEASE_TABLET | Freq: Every day | ORAL | Status: DC
  Administered 2014-11-05: 40 mg via ORAL
  Filled 2014-11-04: qty 1

## 2014-11-04 MED ORDER — VANCOMYCIN HCL IN DEXTROSE 1-5 GM/200ML-% IV SOLN
1000.0000 mg | Freq: Once | INTRAVENOUS | Status: AC
  Administered 2014-11-04: 1000 mg via INTRAVENOUS
  Filled 2014-11-04: qty 200

## 2014-11-04 NOTE — Telephone Encounter (Signed)
[phone], pt cb

## 2014-11-04 NOTE — ED Notes (Signed)
Pt reports SHOB x 30 minutes. Wears oxygen at night, 2L. O2 sats 83% on RA in triage. Denies fever. Pt brought to room 7, Brett Canales with resp at bedside.

## 2014-11-04 NOTE — Telephone Encounter (Signed)
LMTCB x 1 

## 2014-11-04 NOTE — Telephone Encounter (Signed)
lmtcb X1 for pt.  Sleep study was not ordered at pt's last ov.  Per 7/27 phone note, RA wanted to order a split night, this has been ordered and scheduled.      Appointments for this Order    split night study- 01/13/2015 8:00 PM - 30 min

## 2014-11-04 NOTE — Progress Notes (Signed)
ANTICOAGULATION CONSULT NOTE - Initial Consult  Pharmacy Consult for Coumadin Indication: atrial fibrillation  Allergies  Allergen Reactions  . Ambien [Zolpidem] Other (See Comments)    Hallucinations  . Ativan [Lorazepam] Other (See Comments)    hallucinations  . Oxycontin [Oxycodone] Other (See Comments)    hallucinations  . Amiodarone   . Penicillins     Unknown childhood reaction   . Sulfa Antibiotics     unknown    Patient Measurements: Height: 5\' 10"  (177.8 cm) Weight: 149 lb 7.6 oz (67.8 kg) IBW/kg (Calculated) : 73  Vital Signs: Temp: 97.5 F (36.4 C) (08/09 2215) Temp Source: Oral (08/09 2215) BP: 146/51 mmHg (08/09 2300) Pulse Rate: 103 (08/09 2215)  Labs:  Recent Labs  11/21/2014 1530 11/01/2014 1600  HGB 11.6*  --   HCT 35.8*  --   PLT 351  --   LABPROT  --  19.6*  INR  --  1.66*  CREATININE 1.84*  --   TROPONINI 0.04*  --     Estimated Creatinine Clearance: 29.7 mL/min (by C-G formula based on Cr of 1.84).   Medical History: Past Medical History  Diagnosis Date  . Arthritis   . Non-obstructive CAD     a. 12/2013 NSTEMI/Cath: LM nl, LAD 20, LCX 40-50, RCA 20.  Marland Kitchen Hypertension   . Stroke     a. July 2000;  b. January 2007;  c. TIA in 2014.  . CKD (chronic kidney disease), stage III   . GERD (gastroesophageal reflux disease)   . Foot pain, bilateral   . H/O hematuria   . Hyperlipidemia   . Vitamin D deficiency   . Chronic combined systolic and diastolic CHF (congestive heart failure)     a. 12/2013 Echo: EF 30-35%, mod conc LVH, Gr 3 DD, mild AI, mod-sev MR, mod dil LA, mild TR.  Marland Kitchen PAF (paroxysmal atrial fibrillation)     a. CHA2DS2VASc = 7--> Chronic Coumadin.  Marland Kitchen NICM (nonischemic cardiomyopathy)     a. 12/2013 Echo: EF 30-35%.  . Aortic valve prosthesis present     a. 2006: S/P bioprosthetic AVR.  Marland Kitchen PAD (peripheral artery disease)     a. s/p LLE stenting.  . Carotid arterial disease     a. s/p R CEA  . History of tobacco abuse   .  Moderate to Severe Mitral Regurgitation     a. 12/2013 Echo: Mod-Sev MR.  Marland Kitchen Anxiety   . COPD (chronic obstructive pulmonary disease)   . Coronary arteriosclerosis Oct 2015    med Rx  . Shortness of breath dyspnea   . Depression   . Presence of permanent cardiac pacemaker   . Heart murmur   . Dysrhythmia     Medications:  Awaiting electronic med rec  Assessment: 81 y.o. male presents with SOB. Pt on coumadin PTA for afib. Pt also with bioprosthetic AVR. Admit INR 1.66 (subtherapeutic). CBC stable on admit.   Home dose: Coumadin 2mg  daily - last dose 8/8  Goal of Therapy:  INR 2-3 Monitor platelets by anticoagulation protocol: Yes   Plan:  Daily PT/INR Coumadin 3mg  now   Christoper Fabian, PharmD, BCPS Clinical pharmacist, pager [phone] 11/15/2014,11:27 PM

## 2014-11-04 NOTE — Progress Notes (Signed)
ANTIBIOTIC CONSULT NOTE - INITIAL  Pharmacy Consult for ceftazidime Indication: HCAP  Allergies  Allergen Reactions  . Ambien [Zolpidem] Other (See Comments)    Hallucinations  . Ativan [Lorazepam] Other (See Comments)    hallucinations  . Oxycontin [Oxycodone] Other (See Comments)    hallucinations  . Amiodarone   . Penicillins     Unknown childhood reaction   . Sulfa Antibiotics     unknown    Patient Measurements: Height: 5\' 10"  (177.8 cm) Weight: 146 lb (66.225 kg) IBW/kg (Calculated) : 73  Vital Signs: BP: 122/61 mmHg (08/09 1702) Pulse Rate: 100 (08/09 1702) Intake/Output from previous day:   Intake/Output from this shift:    Labs:  Recent Labs  11/30/14 1530  WBC 8.2  HGB 11.6*  PLT 351  CREATININE 1.84*   Estimated Creatinine Clearance: 29 mL/min (by C-G formula based on Cr of 1.84). No results for input(s): VANCOTROUGH, VANCOPEAK, VANCORANDOM, GENTTROUGH, GENTPEAK, GENTRANDOM, TOBRATROUGH, TOBRAPEAK, TOBRARND, AMIKACINPEAK, AMIKACINTROU, AMIKACIN in the last 72 hours.   Microbiology: Recent Results (from the past 720 hour(s))  MRSA PCR Screening     Status: None   Collection Time: 10/07/14  5:18 PM  Result Value Ref Range Status   MRSA by PCR NEGATIVE NEGATIVE Final    Comment:        The GeneXpert MRSA Assay (FDA approved for NASAL specimens only), is one component of a comprehensive MRSA colonization surveillance program. It is not intended to diagnose MRSA infection nor to guide or monitor treatment for MRSA infections.     Medical History: Past Medical History  Diagnosis Date  . Arthritis   . Non-obstructive CAD     a. 12/2013 NSTEMI/Cath: LM nl, LAD 20, LCX 40-50, RCA 20.  Marland Kitchen Hypertension   . Stroke     a. July 2000;  b. January 2007;  c. TIA in 2014.  . CKD (chronic kidney disease), stage III   . GERD (gastroesophageal reflux disease)   . Foot pain, bilateral   . H/O hematuria   . Hyperlipidemia   . Vitamin D deficiency   .  Chronic combined systolic and diastolic CHF (congestive heart failure)     a. 12/2013 Echo: EF 30-35%, mod conc LVH, Gr 3 DD, mild AI, mod-sev MR, mod dil LA, mild TR.  Marland Kitchen PAF (paroxysmal atrial fibrillation)     a. CHA2DS2VASc = 7--> Chronic Coumadin.  Marland Kitchen NICM (nonischemic cardiomyopathy)     a. 12/2013 Echo: EF 30-35%.  . Aortic valve prosthesis present     a. 2006: S/P bioprosthetic AVR.  Marland Kitchen PAD (peripheral artery disease)     a. s/p LLE stenting.  . Carotid arterial disease     a. s/p R CEA  . History of tobacco abuse   . Moderate to Severe Mitral Regurgitation     a. 12/2013 Echo: Mod-Sev MR.  Marland Kitchen Anxiety   . COPD (chronic obstructive pulmonary disease)   . Coronary arteriosclerosis Oct 2015    med Rx  . Shortness of breath dyspnea   . Depression   . Presence of permanent cardiac pacemaker   . Heart murmur   . Dysrhythmia     Assessment: 28 yoM [facility] to [facility] with SOB and fatigue for the last several days. CXR showed chronic lung disease with suspected acute bilateral infectious exacerbation. WBC 8.2, Scr 1.84 (CrCl ~ 30 ml/min). Received Vanc 1g x1 in ED.  PMH: HTN, COPD, hx of stroke, CKD not on HD, Afib  Goal of Therapy:  Eradication of infection  Plan:  - Will start ceftazidime 1g IV q24h - Follow up C&S, CBC, and clinical progress   Casilda Carls, PharmD. Clinical Pharmacist Resident Pager: [phone]

## 2014-11-04 NOTE — ED Notes (Signed)
Pt states breathing is alot better

## 2014-11-04 NOTE — ED Provider Notes (Signed)
CSN:  [id number]     Arrival date & time 11-08-14  1515 History   First MD Initiated Contact with Patient 11-08-14 1519     Chief Complaint  Patient presents with  . Shortness of Breath     (Consider location/radiation/quality/duration/timing/severity/associated sxs/prior Treatment) The history is provided by the patient.  Timothy Durham is a 81 y.o. male hx of HTN, nonobstructive CAD, stroke, CKD not on dialysis, HL, afib and AVR on coumadin here with shortness of breath. Shortness of breath for the last several days. Patient has been having decreased activity level and just lays around at home. He went to [employer] today and went home and had worsening shortness of breath. Denies fevers, has chronic nonproductive cough. Recently admitted 3 weeks ago for CHF/pneumonia. Was noted to be hypoxic 83% in triage and patient not on oxygen at baseline.    Level V caveat- condition of patient   Past Medical History  Diagnosis Date  . Arthritis   . Non-obstructive CAD     a. 12/2013 NSTEMI/Cath: LM nl, LAD 20, LCX 40-50, RCA 20.  Marland Kitchen Hypertension   . Stroke     a. July 2000;  b. January 2007;  c. TIA in 2014.  . CKD (chronic kidney disease), stage III   . GERD (gastroesophageal reflux disease)   . Foot pain, bilateral   . H/O hematuria   . Hyperlipidemia   . Vitamin D deficiency   . Chronic combined systolic and diastolic CHF (congestive heart failure)     a. 12/2013 Echo: EF 30-35%, mod conc LVH, Gr 3 DD, mild AI, mod-sev MR, mod dil LA, mild TR.  Marland Kitchen PAF (paroxysmal atrial fibrillation)     a. CHA2DS2VASc = 7--> Chronic Coumadin.  Marland Kitchen NICM (nonischemic cardiomyopathy)     a. 12/2013 Echo: EF 30-35%.  . Aortic valve prosthesis present     a. 2006: S/P bioprosthetic AVR.  Marland Kitchen PAD (peripheral artery disease)     a. s/p LLE stenting.  . Carotid arterial disease     a. s/p R CEA  . History of tobacco abuse   . Moderate to Severe Mitral Regurgitation     a. 12/2013 Echo: Mod-Sev MR.  Marland Kitchen  Anxiety   . COPD (chronic obstructive pulmonary disease)   . Coronary arteriosclerosis Oct 2015    med Rx  . Shortness of breath dyspnea   . Depression   . Presence of permanent cardiac pacemaker   . Heart murmur   . Dysrhythmia    Past Surgical History  Procedure Laterality Date  . Aortic valve replacement (avr)/coronary artery bypass grafting (cabg)  12/2008  . Eye surgery    . Carotid angiogram  1996  . Left lower extremity stent    . Carotid endarterectomy    . Insert / replace / remove pacemaker    . Coronary angiogram  Oct 2015  . Pacemaker insertion  Nov 2015    MDT BiV  . Left and right heart catheterization with coronary angiogram N/A 01/14/2014    Procedure: LEFT AND RIGHT HEART CATHETERIZATION WITH CORONARY ANGIOGRAM;  Surgeon: Peter M Swaziland, MD;  Location: [facility] CATH LAB;  Service: Cardiovascular;  Laterality: N/A;  . Bi-ventricular pacemaker insertion N/A 02/17/2014    Procedure: BI-VENTRICULAR PACEMAKER INSERTION (CRT-P);  Surgeon: Marinus Maw, MD;  Location: [facility] CATH LAB;  Service: Cardiovascular;  Laterality: N/A;  . Coronary angioplasty    . Tonsillectomy     Family History  Problem Relation Age of Onset  .  Stroke Mother   . Cancer Father    History  Substance Use Topics  . Smoking status: Former Smoker -- 2.00 packs/day for 35 years    Types: Cigarettes    Quit date: 09/26/1998  . Smokeless tobacco: Never Used  . Alcohol Use: 0.6 oz/week    1 Cans of beer per week     Comment: drinks one can of beer or wine daily.    Review of Systems  Respiratory: Positive for cough and shortness of breath.   All other systems reviewed and are negative.     Allergies  Ambien; Ativan; Oxycontin; Amiodarone; Penicillins; and Sulfa antibiotics  Home Medications   Prior to Admission medications   Medication Sig Start Date End Date Taking? Authorizing Provider  acetaminophen (TYLENOL) 500 MG tablet Take 1,000 mg by mouth at bedtime.     Historical Provider, MD   albuterol (PROVENTIL HFA;VENTOLIN HFA) 108 (90 BASE) MCG/ACT inhaler Inhale 2 puffs into the lungs every 6 (six) hours as needed for wheezing or shortness of breath. 10/29/14   Laurey Morale, MD  allopurinol (ZYLOPRIM) 100 MG tablet Take 200 mg by mouth daily.    Historical Provider, MD  atorvastatin (LIPITOR) 40 MG tablet Take 40 mg by mouth every evening.    Historical Provider, MD  Calcium Carb-Cholecalciferol (CALCIUM 600 + D PO) Take 2 tablets by mouth every morning.    Historical Provider, MD  carvedilol (COREG) 6.25 MG tablet Take 1 tablet (6.25 mg total) by mouth 2 (two) times daily with a meal. 09/12/14   Laurey Morale, MD  clopidogrel (PLAVIX) 75 MG tablet Take 75 mg by mouth daily.    Historical Provider, MD  DULoxetine (CYMBALTA) 60 MG capsule Take 60 mg by mouth daily.  03/18/14   Historical Provider, MD  eplerenone (INSPRA) 25 MG tablet Take 1 tablet (25 mg total) by mouth daily. 10/29/14   Laurey Morale, MD  Omega-3 Fatty Acids (FISH OIL) 1200 MG CAPS Take 1 capsule by mouth every morning.    Historical Provider, MD  omeprazole (PRILOSEC) 40 MG capsule Take 40 mg by mouth every morning.    Historical Provider, MD  potassium chloride SA (K-DUR,KLOR-CON) 20 MEQ tablet Take 1 tablet (20 mEq total) by mouth as needed. TAKE ONLY WHEN YOU TAKE METOLAZONE 04/10/14   Dolores Patty, MD  tamsulosin (FLOMAX) 0.4 MG CAPS capsule Take 2 capsules (0.8 mg total) by mouth at bedtime. 08/07/14   Penny Pia, MD  torsemide (DEMADEX) 20 MG tablet Take 3 tablets (60 mg total) by mouth 2 (two) times daily. 60 mg in the morning, 40 mg in the evening 10/29/14   Laurey Morale, MD  warfarin (COUMADIN) 2 MG tablet Take 2 mg by mouth daily.    Historical Provider, MD   BP 122/61 mmHg  Pulse 100  Resp 25  Ht  (1.778 m)  Wt 146 lb (66.225 kg)  BMI 20.95 kg/m2  SpO2 100% Physical Exam  Constitutional: He is oriented to person, place, and time.  Tachypneic, retracting   HENT:  Head:  Normocephalic.  MM dry   Eyes: Conjunctivae are normal. Pupils are equal, round, and reactive to light.  Neck: Normal range of motion. Neck supple.  Cardiovascular: Normal rate, regular rhythm and normal heart sounds.   Pulmonary/Chest:  Diminished throughout, retractions. + diffuse wheezing   Abdominal: Soft. Bowel sounds are normal. He exhibits no distension. There is no tenderness. There is no rebound.  Musculoskeletal: Normal range  of motion. He exhibits no edema or tenderness.  Neurological: He is alert and oriented to person, place, and time. No cranial nerve deficit. Coordination normal.  Skin: Skin is warm and dry.  Psychiatric: He has a normal mood and affect. His behavior is normal. Judgment and thought content normal.  Nursing note and vitals reviewed.   ED Course  Procedures (including critical care time)  CRITICAL CARE Performed by: Silverio Lay, DAVID   Total critical care time: 30 min   Critical care time was exclusive of separately billable procedures and treating other patients.  Critical care was necessary to treat or prevent imminent or life-threatening deterioration.  Critical care was time spent personally by me on the following activities: development of treatment plan with patient and/or surrogate as well as nursing, discussions with consultants, evaluation of patient's response to treatment, examination of patient, obtaining history from patient or surrogate, ordering and performing treatments and interventions, ordering and review of laboratory studies, ordering and review of radiographic studies, pulse oximetry and re-evaluation of patient's condition.   Labs Review Labs Reviewed  CBC WITH DIFFERENTIAL/PLATELET - Abnormal; Notable for the following:    RBC 3.82 (*)    Hemoglobin 11.6 (*)    HCT 35.8 (*)    RDW 16.5 (*)    Lymphocytes Relative 8 (*)    Eosinophils Relative 7 (*)    All other components within normal limits  COMPREHENSIVE METABOLIC PANEL -  Abnormal; Notable for the following:    Chloride 96 (*)    Glucose, Bld 123 (*)    BUN 42 (*)    Creatinine, Ser 1.84 (*)    ALT 11 (*)    GFR calc non Af Amer 32 (*)    GFR calc Af Amer 38 (*)    All other components within normal limits  TROPONIN I - Abnormal; Notable for the following:    Troponin I 0.04 (*)    All other components within normal limits  BRAIN NATRIURETIC PEPTIDE - Abnormal; Notable for the following:    B Natriuretic Peptide 1596.1 (*)    All other components within normal limits  PROTIME-INR - Abnormal; Notable for the following:    Prothrombin Time 19.6 (*)    INR 1.66 (*)    All other components within normal limits  CULTURE, BLOOD (ROUTINE X 2)  CULTURE, BLOOD (ROUTINE X 2)  I-STAT CG4 LACTIC ACID, ED    Imaging Review Dg Chest Port 1 View  10/29/2014   CLINICAL DATA:  81 year old male with shortness of Breath. Current history of COPD, home oxygen requirement. Initial encounter.  EXAM: PORTABLE CHEST - 1 VIEW  COMPARISON:  10/08/2014 and earlier.  FINDINGS: Portable AP semi upright view at 1557 hours. Suspect widespread increased reticulonodular pulmonary opacity, more confluent in the right lung. This is an appearance similar to that in May of this year, but appeared to be cleared on the July comparison. Stable cardiac size and mediastinal contours. No pleural effusion. No pneumothorax. Calcified hilar lymph nodes. Cardiac valve prosthesis and prior sternotomy. Left chest cardiac pacemaker  IMPRESSION: Chronic lung disease with suspected acute bilateral infectious exacerbation in the form of increased reticulonodular opacity in both lungs since July. No pleural effusion.   Electronically Signed   By: Odessa Fleming M.D.   On: 10/28/2014 16:25     EKG Interpretation   Date/Time:  Tuesday November 04 2014 16:41:48 EDT Ventricular Rate:  93 PR Interval:  150 QRS Duration: 148 QT Interval:  434 QTC Calculation:  539 R Axis:   -65 Text Interpretation:  Atrial-sensed  ventricular-paced rhythm Abnormal ECG  No significant change since last tracing Confirmed by YAO  MD, DAVID  ([id number]) on 11-22-2014 6:03:16 PM      MDM   Final diagnoses:  Shortness of breath    Timothy Durham is a 81 y.o. male here with shortness of breath, cough. Likely multi factorial. Consider CHF with COPD and possible pneumonia and ACS. Patient placed on bipap due to hypoxia and respiratory distress. Will check labs, INR, cultures, CXR.   5 pm Patient more comfortable after continuous neb and steroids. Off bipap now. INR subtherapeutic, also Trop mildly elevated. Likely demand ischemia. Will give lovenox. CXR showed pneumonia in addition to COPD. Cultures sent. Ordered Abx for HCAP. Consulted Dr. Anne Fu from cardiology who will follow the patient after admission. Patient BNP elevated but doesn't appear fluid overloaded. Held lasix in the setting of pneumonia and that patient clinically not fluid overloaded.   6:03 PM Discussed with internal medicine teaching service. Will admit to stepdown at [facility] under Dr. Josem Kaufmann.     Richardean Canal, MD 11-22-2014 [phone]

## 2014-11-05 ENCOUNTER — Ambulatory Visit: Payer: Self-pay | Admitting: Neurology

## 2014-11-05 ENCOUNTER — Inpatient Hospital Stay (HOSPITAL_COMMUNITY): Payer: Medicare HMO

## 2014-11-05 DIAGNOSIS — J810 Acute pulmonary edema: Secondary | ICD-10-CM

## 2014-11-05 DIAGNOSIS — I5021 Acute systolic (congestive) heart failure: Secondary | ICD-10-CM

## 2014-11-05 DIAGNOSIS — I480 Paroxysmal atrial fibrillation: Secondary | ICD-10-CM

## 2014-11-05 DIAGNOSIS — I5043 Acute on chronic combined systolic (congestive) and diastolic (congestive) heart failure: Secondary | ICD-10-CM | POA: Insufficient documentation

## 2014-11-05 DIAGNOSIS — K219 Gastro-esophageal reflux disease without esophagitis: Secondary | ICD-10-CM

## 2014-11-05 DIAGNOSIS — J9601 Acute respiratory failure with hypoxia: Secondary | ICD-10-CM

## 2014-11-05 DIAGNOSIS — I5023 Acute on chronic systolic (congestive) heart failure: Secondary | ICD-10-CM

## 2014-11-05 DIAGNOSIS — Z7901 Long term (current) use of anticoagulants: Secondary | ICD-10-CM

## 2014-11-05 DIAGNOSIS — J449 Chronic obstructive pulmonary disease, unspecified: Secondary | ICD-10-CM

## 2014-11-05 DIAGNOSIS — N183 Chronic kidney disease, stage 3 (moderate): Secondary | ICD-10-CM

## 2014-11-05 DIAGNOSIS — Z8673 Personal history of transient ischemic attack (TIA), and cerebral infarction without residual deficits: Secondary | ICD-10-CM

## 2014-11-05 DIAGNOSIS — M109 Gout, unspecified: Secondary | ICD-10-CM

## 2014-11-05 DIAGNOSIS — F418 Other specified anxiety disorders: Secondary | ICD-10-CM

## 2014-11-05 LAB — CBC
HCT: 31.3 % — ABNORMAL LOW (ref 39.0–52.0)
Hemoglobin: 10.3 g/dL — ABNORMAL LOW (ref 13.0–17.0)
MCH: 30.4 pg (ref 26.0–34.0)
MCHC: 32.9 g/dL (ref 30.0–36.0)
MCV: 92.3 fL (ref 78.0–100.0)
Platelets: 301 10*3/uL (ref 150–400)
RBC: 3.39 MIL/uL — ABNORMAL LOW (ref 4.22–5.81)
RDW: 16.8 % — ABNORMAL HIGH (ref 11.5–15.5)
WBC: 5.6 10*3/uL (ref 4.0–10.5)

## 2014-11-05 LAB — TROPONIN I
TROPONIN I: 0.05 ng/mL — AB (ref <0.031)
TROPONIN I: 0.04 ng/mL — AB (ref <0.031)
Troponin I: 0.03 ng/mL (ref <0.031)
Troponin I: 0.37 ng/mL — ABNORMAL HIGH (ref <0.031)

## 2014-11-05 LAB — BASIC METABOLIC PANEL
ANION GAP: 12 (ref 5–15)
BUN: 38 mg/dL — AB (ref 6–20)
CHLORIDE: 95 mmol/L — AB (ref 101–111)
CO2: 30 mmol/L (ref 22–32)
Calcium: 9.4 mg/dL (ref 8.9–10.3)
Creatinine, Ser: 1.81 mg/dL — ABNORMAL HIGH (ref 0.61–1.24)
GFR calc non Af Amer: 33 mL/min — ABNORMAL LOW (ref >60)
GFR, EST AFRICAN AMERICAN: 38 mL/min — AB (ref >60)
Glucose, Bld: 154 mg/dL — ABNORMAL HIGH (ref 65–99)
Potassium: 4.3 mmol/L (ref 3.5–5.1)
Sodium: 137 mmol/L (ref 135–145)

## 2014-11-05 LAB — POCT I-STAT 3, ART BLOOD GAS (G3+)
Acid-Base Excess: 5 mmol/L — ABNORMAL HIGH (ref 0.0–2.0)
Bicarbonate: 32 mEq/L — ABNORMAL HIGH (ref 20.0–24.0)
O2 SAT: 100 %
PO2 ART: 275 mmHg — AB (ref 80.0–100.0)
Patient temperature: 98
TCO2: 34 mmol/L (ref 0–100)
pCO2 arterial: 56.2 mmHg — ABNORMAL HIGH (ref 35.0–45.0)
pH, Arterial: 7.361 (ref 7.350–7.450)

## 2014-11-05 LAB — SEDIMENTATION RATE: SED RATE: 45 mm/h — AB (ref 0–16)

## 2014-11-05 LAB — PROTIME-INR
INR: 1.68 — ABNORMAL HIGH (ref 0.00–1.49)
PROTHROMBIN TIME: 19.8 s — AB (ref 11.6–15.2)

## 2014-11-05 LAB — CARBOXYHEMOGLOBIN
CARBOXYHEMOGLOBIN: 1 % (ref 0.5–1.5)
METHEMOGLOBIN: 1.4 % (ref 0.0–1.5)
O2 Saturation: 75.1 %
Total hemoglobin: 11.1 g/dL — ABNORMAL LOW (ref 13.5–18.0)

## 2014-11-05 LAB — PROCALCITONIN: Procalcitonin: 0.17 ng/mL

## 2014-11-05 LAB — GLUCOSE, CAPILLARY: Glucose-Capillary: 168 mg/dL — ABNORMAL HIGH (ref 65–99)

## 2014-11-05 MED ORDER — DEXTROSE 5 % IV SOLN
INTRAVENOUS | Status: AC
  Filled 2014-11-05: qty 100

## 2014-11-05 MED ORDER — ETOMIDATE 2 MG/ML IV SOLN
INTRAVENOUS | Status: AC
  Administered 2014-11-05: 20 mg
  Filled 2014-11-05: qty 20

## 2014-11-05 MED ORDER — WARFARIN SODIUM 3 MG PO TABS
3.0000 mg | ORAL_TABLET | ORAL | Status: AC
  Administered 2014-11-05: 3 mg via ORAL
  Filled 2014-11-05: qty 1

## 2014-11-05 MED ORDER — FUROSEMIDE 10 MG/ML IJ SOLN
5.0000 mg/h | INTRAMUSCULAR | Status: DC
  Administered 2014-11-05: 5 mg/h via INTRAVENOUS
  Filled 2014-11-05: qty 25

## 2014-11-05 MED ORDER — LIDOCAINE HCL (CARDIAC) 20 MG/ML IV SOLN
INTRAVENOUS | Status: AC
  Filled 2014-11-05: qty 5

## 2014-11-05 MED ORDER — MIDAZOLAM HCL 2 MG/2ML IJ SOLN
1.0000 mg | INTRAMUSCULAR | Status: DC | PRN
  Administered 2014-11-06 – 2014-11-07 (×4): 2 mg via INTRAVENOUS
  Filled 2014-11-05 (×4): qty 2

## 2014-11-05 MED ORDER — FENTANYL CITRATE (PF) 100 MCG/2ML IJ SOLN
100.0000 ug | Freq: Once | INTRAMUSCULAR | Status: AC
Start: 2014-11-05 — End: 2014-11-05

## 2014-11-05 MED ORDER — HEPARIN BOLUS VIA INFUSION
3000.0000 [IU] | Freq: Once | INTRAVENOUS | Status: AC
  Administered 2014-11-05: 3000 [IU] via INTRAVENOUS
  Filled 2014-11-05: qty 3000

## 2014-11-05 MED ORDER — ROCURONIUM BROMIDE 50 MG/5ML IV SOLN: 50.0000 mg | Freq: Once | INTRAVENOUS | Status: AC

## 2014-11-05 MED ORDER — METHYLPREDNISOLONE SODIUM SUCC 125 MG IJ SOLR
60.0000 mg | Freq: Four times a day (QID) | INTRAMUSCULAR | Status: DC
  Administered 2014-11-05 – 2014-11-07 (×7): 60 mg via INTRAVENOUS
  Filled 2014-11-05: qty 2
  Filled 2014-11-05: qty 0.96
  Filled 2014-11-05: qty 2
  Filled 2014-11-05 (×2): qty 0.96
  Filled 2014-11-05: qty 2
  Filled 2014-11-05 (×5): qty 0.96

## 2014-11-05 MED ORDER — FENTANYL CITRATE (PF) 100 MCG/2ML IJ SOLN
INTRAMUSCULAR | Status: AC
  Filled 2014-11-05: qty 4

## 2014-11-05 MED ORDER — DILTIAZEM HCL 100 MG IV SOLR
5.0000 mg/h | INTRAVENOUS | Status: DC
  Administered 2014-11-05: 10 mg/h via INTRAVENOUS

## 2014-11-05 MED ORDER — MIDAZOLAM HCL 2 MG/2ML IJ SOLN
INTRAMUSCULAR | Status: AC
  Administered 2014-11-05: 2 mg
  Filled 2014-11-05: qty 2

## 2014-11-05 MED ORDER — METOLAZONE 5 MG PO TABS
5.0000 mg | ORAL_TABLET | Freq: Every day | ORAL | Status: AC
  Administered 2014-11-05: 5 mg via ORAL
  Filled 2014-11-05: qty 2

## 2014-11-05 MED ORDER — ANTISEPTIC ORAL RINSE SOLUTION (CORINZ)
7.0000 mL | Freq: Four times a day (QID) | OROMUCOSAL | Status: DC
  Administered 2014-11-06 – 2014-11-07 (×6): 7 mL via OROMUCOSAL

## 2014-11-05 MED ORDER — SUCCINYLCHOLINE CHLORIDE 20 MG/ML IJ SOLN
INTRAMUSCULAR | Status: AC
  Filled 2014-11-05: qty 1

## 2014-11-05 MED ORDER — DILTIAZEM LOAD VIA INFUSION
10.0000 mg | Freq: Once | INTRAVENOUS | Status: DC
  Filled 2014-11-05: qty 10

## 2014-11-05 MED ORDER — CHLORHEXIDINE GLUCONATE 0.12% ORAL RINSE (MEDLINE KIT)
15.0000 mL | Freq: Two times a day (BID) | OROMUCOSAL | Status: DC
  Administered 2014-11-05 – 2014-11-06 (×3): 15 mL via OROMUCOSAL

## 2014-11-05 MED ORDER — HEPARIN (PORCINE) IN NACL 100-0.45 UNIT/ML-% IJ SOLN
950.0000 [IU]/h | INTRAMUSCULAR | Status: DC
Start: 2014-11-05 — End: 2014-11-06
  Administered 2014-11-05: 950 [IU]/h via INTRAVENOUS
  Filled 2014-11-05 (×2): qty 250

## 2014-11-05 MED ORDER — ROCURONIUM BROMIDE 50 MG/5ML IV SOLN
INTRAVENOUS | Status: AC
  Administered 2014-11-05: 50 mg
  Filled 2014-11-05: qty 2

## 2014-11-05 MED ORDER — FENTANYL CITRATE (PF) 100 MCG/2ML IJ SOLN
INTRAMUSCULAR | Status: AC
  Filled 2014-11-05: qty 2

## 2014-11-05 MED ORDER — MIDAZOLAM HCL 2 MG/2ML IJ SOLN
INTRAMUSCULAR | Status: AC
  Filled 2014-11-05: qty 2

## 2014-11-05 MED ORDER — FENTANYL CITRATE (PF) 100 MCG/2ML IJ SOLN
100.0000 ug | INTRAMUSCULAR | Status: DC | PRN
  Administered 2014-11-05 – 2014-11-07 (×8): 100 ug via INTRAVENOUS
  Filled 2014-11-05 (×7): qty 2

## 2014-11-05 MED ORDER — MORPHINE SULFATE 2 MG/ML IJ SOLN
1.0000 mg | Freq: Once | INTRAMUSCULAR | Status: AC
  Administered 2014-11-05: 1 mg via INTRAVENOUS

## 2014-11-05 MED ORDER — ETOMIDATE 2 MG/ML IV SOLN: 20.0000 mg/kg | Freq: Once | INTRAVENOUS | Status: AC

## 2014-11-05 MED ORDER — METOLAZONE 5 MG PO TABS: 5.0000 mg | ORAL_TABLET | Freq: Once | ORAL | Status: DC

## 2014-11-05 MED ORDER — FUROSEMIDE 10 MG/ML IJ SOLN
80.0000 mg | Freq: Once | INTRAMUSCULAR | Status: AC
Start: 2014-11-05 — End: 2014-11-05
  Administered 2014-11-05: 80 mg via INTRAVENOUS

## 2014-11-05 MED ORDER — MIDAZOLAM HCL 2 MG/2ML IJ SOLN
INTRAMUSCULAR | Status: AC
  Filled 2014-11-05: qty 4

## 2014-11-05 MED ORDER — FUROSEMIDE 10 MG/ML IJ SOLN
INTRAMUSCULAR | Status: AC
  Filled 2014-11-05: qty 8

## 2014-11-05 MED ORDER — WARFARIN - PHARMACIST DOSING INPATIENT
Freq: Every day | Status: DC
  Administered 2014-11-05: 18:00:00

## 2014-11-05 MED ORDER — MIDAZOLAM HCL 2 MG/2ML IJ SOLN
2.0000 mg | Freq: Once | INTRAMUSCULAR | Status: AC
  Administered 2014-11-05: 2 mg via INTRAVENOUS

## 2014-11-05 MED ORDER — MORPHINE SULFATE 2 MG/ML IJ SOLN
INTRAMUSCULAR | Status: AC
  Filled 2014-11-05: qty 1

## 2014-11-05 MED ORDER — WARFARIN SODIUM 3 MG PO TABS
3.0000 mg | ORAL_TABLET | Freq: Once | ORAL | Status: AC
  Administered 2014-11-05: 3 mg via ORAL
  Filled 2014-11-05: qty 1

## 2014-11-05 MED ORDER — FENTANYL CITRATE (PF) 100 MCG/2ML IJ SOLN
INTRAMUSCULAR | Status: AC
  Administered 2014-11-05: 100 ug
  Filled 2014-11-05: qty 2

## 2014-11-05 MED ORDER — CARVEDILOL 6.25 MG PO TABS
6.2500 mg | ORAL_TABLET | Freq: Two times a day (BID) | ORAL | Status: DC
  Administered 2014-11-05 – 2014-11-07 (×5): 6.25 mg via ORAL
  Filled 2014-11-05 (×5): qty 1

## 2014-11-05 MED ORDER — TORSEMIDE 20 MG PO TABS
60.0000 mg | ORAL_TABLET | Freq: Two times a day (BID) | ORAL | Status: DC
Start: 2014-11-05 — End: 2014-11-05
  Administered 2014-11-05: 60 mg via ORAL
  Filled 2014-11-05: qty 3

## 2014-11-05 MED ORDER — CARVEDILOL 6.25 MG PO TABS: 6.2500 mg | ORAL_TABLET | Freq: Two times a day (BID) | ORAL | Status: DC

## 2014-11-05 NOTE — Procedures (Signed)
OGT Insertion  Using bronchoscope was able to directly visualize tube insertion into the esophagus with good gastric sounds heard when injection of air.  Alyson Reedy, M.D. [facility] Pulmonary/Critical Care Medicine. Pager: [phone]. After hours pager: [phone].

## 2014-11-05 NOTE — Progress Notes (Signed)
ANTICOAGULATION CONSULT NOTE - Initial Consult  Pharmacy Consult for coumadin Indication: atrial fibrillation  Allergies  Allergen Reactions  . Ambien [Zolpidem] Other (See Comments)    Hallucinations  . Ativan [Lorazepam] Other (See Comments)    hallucinations  . Oxycontin [Oxycodone] Other (See Comments)    hallucinations  . Amiodarone   . Penicillins     Unknown childhood reaction   . Sulfa Antibiotics     unknown    Patient Measurements: Height: 5\' 10"  (177.8 cm) Weight: 149 lb 7.6 oz (67.8 kg) IBW/kg (Calculated) : 73  Vital Signs: Temp: 98.1 F (36.7 C) (08/10 0752) Temp Source: Oral (08/10 0752) BP: 149/63 mmHg (08/10 1100) Pulse Rate: 117 (08/10 1100)  Labs:  Recent Labs  11/05/2014 1530 11/05/2014 1600 11/05/14 0005 11/05/14 0557 11/05/14 1100  HGB 11.6*  --   --  10.3*  --   HCT 35.8*  --   --  31.3*  --   PLT 351  --   --  301  --   LABPROT  --  19.6*  --   --  19.8*  INR  --  1.66*  --   --  1.68*  CREATININE 1.84*  --   --  1.81*  --   TROPONINI 0.04*  --  0.04* 0.03  --     Estimated Creatinine Clearance: 30.2 mL/min (by C-G formula based on Cr of 1.81).   Medical History: Past Medical History  Diagnosis Date  . Arthritis   . Non-obstructive CAD     a. 12/2013 NSTEMI/Cath: LM nl, LAD 20, LCX 40-50, RCA 20.  Marland Kitchen Hypertension   . Stroke     a. July 2000;  b. January 2007;  c. TIA in 2014.  . CKD (chronic kidney disease), stage III   . GERD (gastroesophageal reflux disease)   . Foot pain, bilateral   . H/O hematuria   . Hyperlipidemia   . Vitamin D deficiency   . Chronic combined systolic and diastolic CHF (congestive heart failure)     a. 12/2013 Echo: EF 30-35%, mod conc LVH, Gr 3 DD, mild AI, mod-sev MR, mod dil LA, mild TR.  Marland Kitchen PAF (paroxysmal atrial fibrillation)     a. CHA2DS2VASc = 7--> Chronic Coumadin.  Marland Kitchen NICM (nonischemic cardiomyopathy)     a. 12/2013 Echo: EF 30-35%.  . Aortic valve prosthesis present     a. 2006: S/P  bioprosthetic AVR.  Marland Kitchen PAD (peripheral artery disease)     a. s/p LLE stenting.  . Carotid arterial disease     a. s/p R CEA  . History of tobacco abuse   . Moderate to Severe Mitral Regurgitation     a. 12/2013 Echo: Mod-Sev MR.  Marland Kitchen Anxiety   . COPD (chronic obstructive pulmonary disease)   . Coronary arteriosclerosis Oct 2015    med Rx  . Shortness of breath dyspnea   . Depression   . Presence of permanent cardiac pacemaker   . Heart murmur   . Dysrhythmia      Assessment: 81 y.o. male presents with SOB. Pt on coumadin PTA for afib (also with bioprosthetic AVR) and pharmacy consulted to dose coumadin. INR today is 1.68 and below goal.   -home coumadin regimen: 2mg /day  Goal of Therapy:  INR 2-3 Monitor platelets by anticoagulation protocol: Yes   Plan: -Coumadin 3mg  po today -Daily PT/INR  Harland German, Pharm D 11/05/2014 12:21 PM

## 2014-11-05 NOTE — Procedures (Signed)
Arterial Catheter Insertion Procedure Note Jahsean Cail [id number] 07/16/1930  Procedure: Insertion of Arterial Catheter  Indications: Blood pressure monitoring  Procedure Details Consent: Risks of procedure as well as the alternatives and risks of each were explained to the (patient/caregiver).  Consent for procedure obtained. Time Out: Verified patient identification, verified procedure, site/side was marked, verified correct patient position, special equipment/implants available, medications/allergies/relevent history reviewed, required imaging and test results available.  Performed  Maximum sterile technique was used including antiseptics, cap, gloves, gown, hand hygiene, mask and sheet. Skin prep: Chlorhexidine; local anesthetic administered 20 gauge catheter was inserted into right radial artery using the Seldinger technique.  Evaluation Blood flow good; BP tracing good. Complications: No apparent complications.   YACOUB,WESAM 11/05/2014

## 2014-11-05 NOTE — Telephone Encounter (Signed)
Patient's wife is returning Ashtyn's call from yesterday.  Also advised the patient was admitted to [facility] late last night.  She will be at the hospital, please call her cell [phone].

## 2014-11-05 NOTE — Telephone Encounter (Signed)
Spoke with the pt's spouse and notified of sleep study date/time  She verbalized understanding  Will forward to [facility] to ensure information will be mailed to the pt  Once this is verified the msg can be closed

## 2014-11-05 NOTE — Progress Notes (Signed)
I/O cath done. Suspect urinary retention. 500 cc returned. Shortness of breath is no better per patient.  Cardizem drip started with titration to 20 mg/hr.

## 2014-11-05 NOTE — H&P (Signed)
Date: 11/05/2014               Patient Name:  Timothy Durham MRN: [id number]  DOB: Mar 25, 1931 Age / Sex: 81 y.o., male   PCP: Diana Eves. Luiz Iron, MD         Medical Service: Internal Medicine Teaching Service         Attending Physician: Dr. Doneen Poisson, MD    First Contact: Carolynn Comment MS4 Pager: [phone]  Second Contact: Dr. Andrey Campanile Pager: [phone]       After Hours (After 5p/  First Contact Pager: [phone]  weekends / holidays): Second Contact Pager: [phone]   Chief Complaint: Worsening SOB  History of Present Illness: Timothy Durham is a 80 y.o. male w/ PMHx of HTN, HLD, COPD, PAF on Coumadin, h/o AVR (bioprosthetic), chronic combined CHF, PVD, and GERD, presents to the [facility] ED w/ complaints of worsening SOB. Patient states he has had increasing dyspnea for the past 3-4 days which became so severe he felt he needed to come to the ED. He was out walking with his wife, went to the farmer's market, and [employer] and states he simply could not catch his breath. The patient does note he has baseline dyspnea but states today it was simply unbearable. He also notes worsening cough somewhat productive of a clear and sometimes yellowish sputum. Patient denies fever, chills, nausea, vomiting, chest pain, PND, or orthopnea. He does admit that the SOB is worse w/ exertion but also states that he had it at rest today which was improved w/ albuterol inhaler. He denies sick contacts, non-compliance w/ diuretics at home, or weight gain since his last admission in 09/2014.    Meds: Current Facility-Administered Medications  Medication Dose Route Frequency Provider Last Rate Last Dose  . allopurinol (ZYLOPRIM) tablet 200 mg  200 mg Oral Daily Courtney Paris, MD      . atorvastatin (LIPITOR) tablet 40 mg  40 mg Oral QPM Courtney Paris, MD      . cefTAZidime Elita Quick) 1 G injection           . clopidogrel (PLAVIX) tablet 75 mg  75 mg Oral Daily Courtney Paris, MD      . DULoxetine (CYMBALTA) DR capsule  60 mg  60 mg Oral Daily Courtney Paris, MD      . ipratropium-albuterol (DUONEB) 0.5-2.5 (3) MG/3ML nebulizer solution 3 mL  3 mL Nebulization Q4H PRN Courtney Paris, MD      . pantoprazole (PROTONIX) EC tablet 40 mg  40 mg Oral Daily Courtney Paris, MD      . predniSONE (DELTASONE) tablet 40 mg  40 mg Oral Q breakfast Courtney Paris, MD      . sodium chloride 0.9 % injection 3 mL  3 mL Intravenous Q12H Courtney Paris, MD   3 mL at 11/05/14 0008  . warfarin (COUMADIN) tablet 3 mg  3 mg Oral STAT Titus Mould, [facility]      . Warfarin - Pharmacist Dosing Inpatient   Does not apply q1800 Titus Mould, [facility]        Allergies: Allergies as of November 28, 2014 - Review Complete Nov 28, 2014  Allergen Reaction Noted  . Ambien [zolpidem] Other (See Comments) 01/13/2014  . Ativan [lorazepam] Other (See Comments) 01/13/2014  . Oxycontin [oxycodone] Other (See Comments) 04/10/2014  . Amiodarone  08/18/2014  . Penicillins  03/25/2013  . Sulfa antibiotics  03/25/2013   Past Medical History  Diagnosis  Date  . Arthritis   . Non-obstructive CAD     a. 12/2013 NSTEMI/Cath: LM nl, LAD 20, LCX 40-50, RCA 20.  Marland Kitchen Hypertension   . Stroke     a. July 2000;  b. January 2007;  c. TIA in 2014.  . CKD (chronic kidney disease), stage III   . GERD (gastroesophageal reflux disease)   . Foot pain, bilateral   . H/O hematuria   . Hyperlipidemia   . Vitamin D deficiency   . Chronic combined systolic and diastolic CHF (congestive heart failure)     a. 12/2013 Echo: EF 30-35%, mod conc LVH, Gr 3 DD, mild AI, mod-sev MR, mod dil LA, mild TR.  Marland Kitchen PAF (paroxysmal atrial fibrillation)     a. CHA2DS2VASc = 7--> Chronic Coumadin.  Marland Kitchen NICM (nonischemic cardiomyopathy)     a. 12/2013 Echo: EF 30-35%.  . Aortic valve prosthesis present     a. 2006: S/P bioprosthetic AVR.  Marland Kitchen PAD (peripheral artery disease)     a. s/p LLE stenting.  . Carotid arterial disease     a. s/p R CEA  . History of tobacco abuse   . Moderate to Severe Mitral  Regurgitation     a. 12/2013 Echo: Mod-Sev MR.  Marland Kitchen Anxiety   . COPD (chronic obstructive pulmonary disease)   . Coronary arteriosclerosis Oct 2015    med Rx  . Shortness of breath dyspnea   . Depression   . Presence of permanent cardiac pacemaker   . Heart murmur   . Dysrhythmia    Past Surgical History  Procedure Laterality Date  . Aortic valve replacement (avr)/coronary artery bypass grafting (cabg)  12/2008  . Eye surgery    . Carotid angiogram  1996  . Left lower extremity stent    . Carotid endarterectomy    . Insert / replace / remove pacemaker    . Coronary angiogram  Oct 2015  . Pacemaker insertion  Nov 2015    MDT BiV  . Left and right heart catheterization with coronary angiogram N/A 01/14/2014    Procedure: LEFT AND RIGHT HEART CATHETERIZATION WITH CORONARY ANGIOGRAM;  Surgeon: Peter M Swaziland, MD;  Location: [facility] CATH LAB;  Service: Cardiovascular;  Laterality: N/A;  . Bi-ventricular pacemaker insertion N/A 02/17/2014    Procedure: BI-VENTRICULAR PACEMAKER INSERTION (CRT-P);  Surgeon: Marinus Maw, MD;  Location[facility] CATH LAB;  Service: Cardiovascular;  Laterality: N/A;  . Coronary angioplasty    . Tonsillectomy     Family History  Problem Relation Age of Onset  . Stroke Mother   . Cancer Father    Social History   Social History  . Marital Status: Married    Spouse Name: N/A  . Number of Children: N/A  . Years of Education: N/A   Occupational History  . Not on file.   Social History Main Topics  . Smoking status: Former Smoker -- 2.00 packs/day for 35 years    Types: Cigarettes    Quit date: 09/26/1998  . Smokeless tobacco: Never Used  . Alcohol Use: 0.6 oz/week    1 Cans of beer per week     Comment: drinks one can of beer or wine daily.  . Drug Use: No  . Sexual Activity: Not on file   Other Topics Concern  . Not on file   Social History Narrative   -lives with wife in [place] (09/2014)    Review of Systems: General: Denies fever, chills,  diaphoresis, appetite change and  fatigue.  Respiratory: Positive for SOB, DOE, and cough. Denies chest tightness, and wheezing.   Cardiovascular: Denies chest pain and palpitations.  Gastrointestinal: Denies nausea, vomiting, abdominal pain, diarrhea, constipation, blood in stool and abdominal distention.  Genitourinary: Denies dysuria, urgency, frequency, hematuria, and flank pain. Musculoskeletal: Denies myalgias, back pain, joint swelling, arthralgias and gait problem.  Skin: Denies pallor, rash and wounds.  Neurological: Positive for intermittent dizziness, lightheadedness. Denies syncope, weakness, numbness and headaches.  Psychiatric/Behavioral: Denies mood changes, confusion, nervousness, sleep disturbance and agitation.  Physical Exam: Blood pressure 126/44, pulse 103, temperature 97.5 F (36.4 C), temperature source Oral, resp. rate 19, height 5\' 10"  (1.778 m), weight 149 lb 7.6 oz (67.8 kg), SpO2 92 %.  General: Elderly white male, alert, cooperative, NAD. On 6L via [place].  HEENT: PERRL, EOMI. Moist mucus membranes Neck: Full range of motion without pain, supple, no lymphadenopathy or carotid bruits. No JVD noted.  Lungs: Decreased air entry in the left base. Scattered rales and rhonchi throughout. No wheezes.  Heart: Tachycardic, irregular, no murmurs, gallops, or rubs Abdomen: Soft, non-tender, non-distended, BS + Extremities: No cyanosis, clubbing, or edema Neurologic: Alert & oriented x3, cranial nerves II-XII intact, strength grossly intact, sensation intact to light touch    Lab results: Basic Metabolic Panel:  Recent Labs  16/10/96 1530  NA 138  K 4.0  CL 96*  CO2 30  GLUCOSE 123*  BUN 42*  CREATININE 1.84*  CALCIUM 9.4   Liver Function Tests:  Recent Labs  11/26/2014 1530  AST 19  ALT 11*  ALKPHOS 80  BILITOT 1.1  PROT 7.2  ALBUMIN 3.8   CBC:  Recent Labs  10/29/2014 1530  WBC 8.2  NEUTROABS 6.4  HGB 11.6*  HCT 35.8*  MCV 93.7  PLT 351    Cardiac Enzymes:  Recent Labs  11/05/2014 1530 11/05/14 0005  TROPONINI 0.04* 0.04*   Coagulation:  Recent Labs  11/13/2014 1600  LABPROT 19.6*  INR 1.66*   Urine Drug Screen: Drugs of Abuse     Component Value Date/Time   LABOPIA NONE DETECTED 03/26/2013 1408   COCAINSCRNUR NONE DETECTED 03/26/2013 1408   LABBENZ NONE DETECTED 03/26/2013 1408   AMPHETMU NONE DETECTED 03/26/2013 1408   THCU NONE DETECTED 03/26/2013 1408   LABBARB NONE DETECTED 03/26/2013 1408    Imaging results:  Dg Chest Port 1 View  11/09/2014   CLINICAL DATA:  81 year old male with shortness of Breath. Current history of COPD, home oxygen requirement. Initial encounter.  EXAM: PORTABLE CHEST - 1 VIEW  COMPARISON:  10/08/2014 and earlier.  FINDINGS: Portable AP semi upright view at 1557 hours. Suspect widespread increased reticulonodular pulmonary opacity, more confluent in the right lung. This is an appearance similar to that in May of this year, but appeared to be cleared on the July comparison. Stable cardiac size and mediastinal contours. No pleural effusion. No pneumothorax. Calcified hilar lymph nodes. Cardiac valve prosthesis and prior sternotomy. Left chest cardiac pacemaker  IMPRESSION: Chronic lung disease with suspected acute bilateral infectious exacerbation in the form of increased reticulonodular opacity in both lungs since July. No pleural effusion.   Electronically Signed   By: Odessa Fleming M.D.   On: 10/31/2014 16:25    Other results: EKG: V-paced, 93 BPM. No acute changed from previous EKG.   Assessment & Plan by Problem: Timothy Durham is a 81 y.o. male w/ PMHx of HTN, HLD, COPD, PAF on Coumadin, h/o AVR (bioprosthetic), chronic combined CHF, PVD, and GERD, admitted  for acute respiratory failure.   Acute Hypoxic Respiratory Failure: Though to be 2/2 CHF vs COPD exacerbation vs atypical pneumonia, although still quite unclear. Patient w/ worsening SOB and DOE, increased cough, productive of  clear/yellow sputum. No fever or chills, no sick contacts, no leukocytosis. Denies non-compliance w/ home diuretics. Does admit to improved breathing w/ Albuterol at home. Decreased SpO2 into the low 80's in ED, placed on BiPAP, given SoluMedrol 125 mg IV once and Albuterol nebs with significant improvement. Also given Vancomycin + Fortaz for HCAP coverage. Patient also w/ increased BNP to 1596, portable CXR significant for bilateral reticulonodular opacities. Lungs w/ decreased breath sounds in LLL, rales throughout. Patient appears euvolemic on exam, weight same as on recent discharge. Also of note, patient w/ previous h/o Amiodarone toxicity w/ organizing pneumonia on previous admission in 06/2014. Amiodarone stopped at that time. Also recent admission in 09/2014 for suspected CHF exacerbation. Do not suspect infectious/bacterial pneumonia given absence of fever or leukocytosis, although, possibility of atypical vs viral etiology given CXR appearance. Procalcitonin low at 0.17.  -Admit to stepdown -Will give Lasix 40 mg IV once -Repeat CXR 2 views for better clarification  -Continue Prednisone 40 mg po daily starting in AM -Duonebs q4h prn -Can consider restarting atypical/ABx coverage if CXR suggests infiltrative process -Supplemental O2 + BiPAP prn -Daily weights, strict intake/output -Repeat CBC, BMP in AM -For sleep study as an outpatient  Chronic Combined CHF: Most recent ECHO 25-30% w/ grade 2 diastolic dysfunction. Also w/ bioprosthetic aortic valve. On Coreg 6.25 mg bid, Eplerenone 25 mg daily (changed from spironolactone 2/2 gynecomastia), and Torsemide 60 mg in AM, 40 mg in PM.  -See above -Lasix 40 mg IV once -Hold Coreg, Epleronone for now -Consider restarting Torsemide in AM  PAF: Patient w/ sinus tachycardia, v-paced rhythm on admission. Taking Coumadin, CHADSVASC 5 w/ annual stroke risk of 5.9%. Also takes Coreg 6.25 mg bid at home. Given full dose of Lovenox in ED.  -Continue  Coumadin -Hold Coreg for now given acute pulmonary process. Can consider restarting in AM for rate control if respiratory status improved.   COPD: Gold's Stage 2 per most recent PFT's in 08/2014. May be contributing to overall clinical picture as discussed above. Follows w/ Dr. Vassie Loll.  -Steroids, Nebs, supplemental O2 as above  CKD Stage 3: Cr at baseline, 1.84 on admission. Not on ACEI/ARB for this reason.  -BMP in AM  H/o TIA 07/2014: Stable. Per chart review, had TIA while on Coumadin alone -Continue Plavix + Statin -Coumadin as above  Gout: Stable.  -Continue Allopurinol 200 mg daily  Depression/Anxiety: Stable.  -Continue Cymbalta  GERD: Stable.  -PPI  DVT/PE PPx: Coumadin  Dispo: Disposition is deferred at this time, awaiting improvement of current medical problems. Anticipated discharge in approximately 2-3 day(s).   The patient does have a current PCP Benjie Karvonen M. Luiz Iron, MD) and does not need an [facility] hospital follow-up appointment after discharge.  The patient does not have transportation limitations that hinder transportation to clinic appointments.  Signed: Gwynn Burly, DO 11/05/2014, 3:07 AM

## 2014-11-05 NOTE — Progress Notes (Signed)
Patient ID: Timothy Durham, male   DOB: 08-26-1930, 81 y.o.   MRN: [id number]   Subjective: When the team saw Timothy Durham this morning, he was sitting up conversing with [place], satting 97% on 2 liters of oxygen via nasal cannula. He said his shortness of breath was much-improved this morning compared to last night. His wife, who meticulously monitors his medical conditions, said that he was walking at the [employer] yesterday evening and became acutely short of breath. He originally went to [facility] Emergency Room where he was given BiPap which drastically improved his shortness of breath. She said that months ago, his legs would begin to swell so she would add metolazone to his diuretic regiment preemptively to prevent his shortness of breath. In recent months however, his shortness of breath has began acutely, without preceding signs of lower extremity swelling.  I was paged by Timothy Durham nurse at 1145 when she alerted me that he was becoming short of breath. At the bedside, he was satting 90% on 6L of oxygen and was tachycardic to the 140s. His lungs had drastically worse end-expiratory crackles compared to several hours prior. He was started on BiPap but he continued to have increased work of breathing. A stat CXR showed worsened pulmonary edema from earlier that morning and EKG showed tachyarrhythmia, concerning for atrial fibrillation with rapid ventricular response. He was started on a diltiazem drip at 10cc/hr which was titrated up to 20cc/hr over the course of thirty minutes. He was also given  IV lasix and a foley was inserted because he was retaining urine. He was given  of IV morphine for mild sedation. His heart rate remained in the 130s so we consulted Cardiology who did not recommend cardioversion at that time. His work of breathing persisted; given concern for exhaustion, critical care was consulted and he was transferred to their service for intubation and further  management.   Objective: Vital signs in last 24 hours: Filed Vitals:   11/05/14 1230 11/05/14 1235 11/05/14 1240 11/05/14 1245  BP: 129/60 132/61 133/69 182/70  Pulse: 133 131 131 126  Temp:      TempSrc:      Resp: 38 Height:      Weight:      SpO2: 96% 98% 97% 97%   Weight change:   Intake/Output Summary (Last 24 hours) at 11/05/14 1316 Last data filed at 11/05/14 1213  Gross per 24 hour  Intake    250 ml  Output    675 ml  Net   -425 ml  General: resting in bed HEENT: No scleral icterus Cardiac: Tachycardic but regular, no rubs, murmurs or gallops Pulm: Bibasilar inspiratory crackles on initial exam. During episode of acute respiratory distress, there were diffuse inspiratory and expiratory crackles. Abd: soft, nontender, nondistended, BS present Ext: warm and well perfused, no pedal edema  Lab Results: Basic Metabolic Panel:  Recent Labs Lab 2014/11/22 1530 11/05/14 0557  NA 138 137  K 4.0 4.3  CL 96* 95*  CO2 30 30  GLUCOSE 123* 154*  BUN 42* 38*  CREATININE 1.84* 1.81*  CALCIUM 9.4 9.4   Liver Function Tests:  Recent Labs Lab 22-Nov-2014 1530  AST 19  ALT 11*  ALKPHOS 80  BILITOT 1.1  PROT 7.2  ALBUMIN 3.8   CBC:  Recent Labs Lab Nov 22, 2014 1530 11/05/14 0557  WBC 8.2 5.6  NEUTROABS 6.4  --   HGB 11.6* 10.3*  HCT 35.8* 31.3*  MCV  93.7 92.3  PLT 351 301   Cardiac Enzymes:  Recent Labs Lab 11/12/2014 1530 11/05/14 0005 11/05/14 0557  TROPONINI 0.04* 0.04* 0.03   Coagulation:  Recent Labs Lab 11/10/2014 1600 11/05/14 1100  LABPROT 19.6* 19.8*  INR 1.66* 1.68*    Studies/Results: X-ray Chest Pa And Lateral  11/05/2014   CLINICAL DATA:  Acute onset of shortness of breath. Initial encounter.  EXAM: CHEST  2 VIEW  COMPARISON:  Chest radiograph performed earlier today at 3:57 p.m.  FINDINGS: The lungs are well-aerated. Small bilateral pleural effusions are seen, left greater than right. Underlying vascular congestion is noted,  with increased interstitial markings, concerning for mild pulmonary edema or possibly pneumonia, improved from the prior study. No pneumothorax is seen.  The heart is borderline normal in size. The patient is status post median sternotomy. A pacemaker is noted at the left chest wall, with leads ending at the right atrium and right ventricle. No acute osseous abnormalities are seen.  IMPRESSION: Interval improvement in underlying increased interstitial markings, which may reflect pulmonary edema or pneumonia. Small bilateral pleural effusions, left greater than right. Underlying vascular congestion noted.   Electronically Signed   By: Roanna Raider M.D.   On: 11/05/2014 03:47   Medications: I have reviewed the patient's current medications. Scheduled Meds: . allopurinol  200 mg Oral Daily  . atorvastatin  40 mg Oral QPM  . carvedilol  6.25 mg Oral BID WC  . clopidogrel  75 mg Oral Daily  . diltiazem (CARDIZEM) infusion      . diltiazem  10 mg Intravenous Once  . DULoxetine  60 mg Oral Daily  . etomidate      . fentaNYL      . furosemide      . lidocaine (cardiac) 100 mg/77ml      . metolazone  5 mg Oral Once  . midazolam      . morphine      . pantoprazole  40 mg Oral Daily  . rocuronium      . sodium chloride  3 mL Intravenous Q12H  . succinylcholine      . torsemide  60 mg Oral BID  . warfarin  3 mg Oral ONCE-1800  . Warfarin - Pharmacist Dosing Inpatient   Does not apply q1800   Continuous Infusions: . diltiazem (CARDIZEM) infusion 10 mg/hr (11/05/14 1213)   PRN Meds:.ipratropium-albuterol   Assessment/Plan: Acute pulmonary edema secondary to atrial fibrillation with rapid ventricular response with underlying systolic dysfunction:  The etiology of Timothy Durham intermittent, acute-onset dyspnea revealed itself during his episode of acute respiratory distress earlier today. His tachyarrhythmia, suspected to be atrial fibrillation with rapid ventricular response, combined with  underlying systolic heart failure, resulted in acute pulmonary congestion due to decreased time for sufficient ventricular filling. After attempting to decrease his heart rate with maximal rate of diltiazem drip of 20cc/hr, his rate would not drop below 130 and his work of breathing continued to worsen on Bipap. He was transferred to the ICU for short-term intubation, rate control, and further diuresis. Troponins are trending, the most recent being 0.05. Earlier this morning when he was stable, we considered starting lisinopril upon discharge. Depending on his progress, and his renal function, we will re-visit this idea.   Dispo: Disposition is deferred at this time, awaiting improvement of current medical problems.  Anticipated discharge in approximately 7-10 day(s).   The patient does have a current PCP Benjie Karvonen M. Luiz Iron, MD) and does need an [facility] hospital  follow-up appointment after discharge.  The patient does have transportation limitations that hinder transportation to clinic appointments.  .Services Needed at time of discharge: Y = Yes, Blank = No PT:   OT:   RN:   Equipment:   Other:     LOS: 1 day   Selina Cooley, MD 11/05/2014, 1:16 PM

## 2014-11-05 NOTE — Progress Notes (Signed)
eLink Physician-Brief Progress Note Patient Name: Timothy Durham DOB: 1930-11-20 MRN: [id number]   Date of Service  11/05/2014  HPI/Events of Note  Asked to review CXR for central venous line placement. R IJ central venous catheter tip in distal SVC vs SVC/R atrial junction. Can't decipher exact tip location d/t pacer leads. No pneumothorax.   eICU Interventions  OK to use R IJ central venous catheter for fluids and medications.      Intervention Category Intermediate Interventions: Diagnostic test evaluation  Jaymie Misch Eugene 11/05/2014, 5:07 PM

## 2014-11-05 NOTE — Procedures (Signed)
Intubation Procedure Note Timothy Durham [id number] 1930-09-27  Procedure: Intubation Indications: Respiratory insufficiency  Procedure Details Consent: Unable to obtain consent because of altered level of consciousness. Time Out: Verified patient identification, verified procedure, site/side was marked, verified correct patient position, special equipment/implants available, medications/allergies/relevent history reviewed, required imaging and test results available.  Performed  Maximum sterile technique was used including gloves, hand hygiene and mask.  MAC    Evaluation Hemodynamic Status: BP stable throughout; O2 sats: stable throughout Patient's Current Condition: stable Complications: No apparent complications Patient did tolerate procedure well. Chest X-ray ordered to verify placement.  CXR: pending.   Timothy Durham 11/05/2014

## 2014-11-05 NOTE — Consult Note (Signed)
PULMONARY / CRITICAL CARE MEDICINE   Name: Timothy Durham MRN: [id number] DOB: 1930-10-12    ADMISSION DATE:  November 25, 2014 CONSULTATION DATE:  11/05/2014  REFERRING MD :  Josem Kaufmann  CHIEF COMPLAINT:  Dyspnea  INITIAL PRESENTATION: 81 yo male with hx of HTN, COPD, HF, presented to ED on 8/9 with dyspnea x 4 days, worsening SOB, increased sputum. Patient was admitted to SDU, placed on CPAP. On 8/10 he had markedly worse edema on CXR and dyspnea, PCCM was consulted. Patient was intubated and central line was placed.  STUDIES:  5/7 Echo: LVEF 25-30%, grade 2 DD, prosthetic AV, PAP 33  SIGNIFICANT EVENTS: 8/10 Dyspnea worsened, patient intubated, CVL placed  HISTORY OF PRESENT ILLNESS:  Timothy Durham is a 81 y.o. male w/ PMHx of HTN, HLD, COPD, PAF on Coumadin, h/o AVR (bioprosthetic), chronic combined CHF, PVD, and GERD, presents to the [facility] ED w/ complaints of worsening SOB. Patient states he has had increasing dyspnea for the past 3-4 days which became so severe he felt he needed to come to the ED. He was out walking with his wife, went to the farmer's market, and [employer] and states he simply could not catch his breath. The patient does note he has baseline dyspnea but states today it was simply unbearable. He also notes worsening cough somewhat productive of a clear and sometimes yellowish sputum. Patient denies fever, chills, nausea, vomiting, chest pain, PND, or orthopnea. He does admit that the SOB is worse w/ exertion but also states that he had it at rest today which was improved w/ albuterol inhaler. He denies sick contacts, non-compliance w/ diuretics at home, or weight gain since his last admission in 09/2014.   On admission patient was diagnosed with acute hypoxic respiratory failure placed on BiPAP, treated for COPD exacerbation, HF exacerbation, and CHAP. On 8/10 patient had sudden onset of increased dyspnea and WOB and PCCM was consulted. Patient was intubated and admitted to the  CCU.   PAST MEDICAL HISTORY :   has a past medical history of Arthritis; Non-obstructive CAD; Hypertension; Stroke; CKD (chronic kidney disease), stage III; GERD (gastroesophageal reflux disease); Foot pain, bilateral; H/O hematuria; Hyperlipidemia; Vitamin D deficiency; Chronic combined systolic and diastolic CHF (congestive heart failure); PAF (paroxysmal atrial fibrillation); NICM (nonischemic cardiomyopathy); Aortic valve prosthesis present; PAD (peripheral artery disease); Carotid arterial disease; History of tobacco abuse; Moderate to Severe Mitral Regurgitation; Anxiety; COPD (chronic obstructive pulmonary disease); Coronary arteriosclerosis (Oct 2015); Shortness of breath dyspnea; Depression; Presence of permanent cardiac pacemaker; Heart murmur; and Dysrhythmia.  has past surgical history that includes Aortic valve replacement (avr)/coronary artery bypass grafting (cabg) (12/2008); Eye surgery; Carotid angiogram (1996); LEFT LOWER EXTREMITY STENT; Carotid endarterectomy; Insert / replace / remove pacemaker; Coronary angiogram (Oct 2015); Pacemaker insertion (Nov 2015); left and right heart catheterization with coronary angiogram (N/A, 01/14/2014); bi-ventricular pacemaker insertion (N/A, 02/17/2014); Coronary angioplasty; and Tonsillectomy. Prior to Admission medications   Medication Sig Start Date End Date Taking? Authorizing Provider  acetaminophen (TYLENOL) 500 MG tablet Take 1,000 mg by mouth at bedtime.    Yes Historical Provider, MD  albuterol (PROVENTIL HFA;VENTOLIN HFA) 108 (90 BASE) MCG/ACT inhaler Inhale 2 puffs into the lungs every 6 (six) hours as needed for wheezing or shortness of breath. 10/29/14  Yes Laurey Morale, MD  allopurinol (ZYLOPRIM) 100 MG tablet Take 200 mg by mouth daily.   Yes Historical Provider, MD  atorvastatin (LIPITOR) 40 MG tablet Take 40 mg by mouth every evening.  Yes Historical Provider, MD  Calcium Carb-Cholecalciferol (CALCIUM 600 + D PO) Take 2 tablets by  mouth every morning.   Yes Historical Provider, MD  carvedilol (COREG) 6.25 MG tablet Take 1 tablet (6.25 mg total) by mouth 2 (two) times daily with a meal. 09/12/14  Yes Laurey Morale, MD  clopidogrel (PLAVIX) 75 MG tablet Take 75 mg by mouth daily.   Yes Historical Provider, MD  colchicine 0.6 MG tablet Take 0.6 mg by mouth daily as needed (gout).   Yes Historical Provider, MD  DULoxetine (CYMBALTA) 60 MG capsule Take 60 mg by mouth daily.  03/18/14  Yes Historical Provider, MD  eplerenone (INSPRA) 25 MG tablet Take 1 tablet (25 mg total) by mouth daily. 10/29/14  Yes Laurey Morale, MD  metolazone (ZAROXOLYN) 5 MG tablet Take 5 mg by mouth daily as needed (swelling).   Yes Historical Provider, MD  Omega-3 Fatty Acids (FISH OIL) 1200 MG CAPS Take 1 capsule by mouth every morning.   Yes Historical Provider, MD  omeprazole (PRILOSEC) 40 MG capsule Take 40 mg by mouth every morning.   Yes Historical Provider, MD  tamsulosin (FLOMAX) 0.4 MG CAPS capsule Take 2 capsules (0.8 mg total) by mouth at bedtime. 08/07/14  Yes Penny Pia, MD  torsemide (DEMADEX) 20 MG tablet Take 3 tablets (60 mg total) by mouth 2 (two) times daily. 60 mg in the morning, 40 mg in the evening 10/29/14  Yes Laurey Morale, MD  warfarin (COUMADIN) 2 MG tablet Take 2 mg by mouth daily.   Yes Historical Provider, MD  potassium chloride SA (K-DUR,KLOR-CON) 20 MEQ tablet Take 1 tablet (20 mEq total) by mouth as needed. TAKE ONLY WHEN YOU TAKE METOLAZONE 04/10/14   Dolores Patty, MD   Allergies  Allergen Reactions  . Ambien [Zolpidem] Other (See Comments)    Hallucinations  . Ativan [Lorazepam] Other (See Comments)    hallucinations  . Oxycontin [Oxycodone] Other (See Comments)    hallucinations  . Amiodarone   . Penicillins     Unknown childhood reaction   . Sulfa Antibiotics     unknown    FAMILY HISTORY:  indicated that his mother is deceased. He indicated that his father is deceased.  SOCIAL HISTORY:  reports  that he quit smoking about 16 years ago. His smoking use included Cigarettes. He has a 70 pack-year smoking history. He has never used smokeless tobacco. He reports that he drinks about 0.6 oz of alcohol per week. He reports that he does not use illicit drugs.  REVIEW OF SYSTEMS:   Unable to be obtained as patient was being intubated   SUBJECTIVE:   VITAL SIGNS: Temp:  [97.5 F (36.4 C)-98.1 F (36.7 C)] 98 F (36.7 C) (08/10 1314) Pulse Rate:  [92-141] 121 (08/10 1314) Resp:  [17-48] 34 (08/10 1314) BP: (101-182)/(31-70) 168/44 mmHg (08/10 1314) SpO2:  [83 %-100 %] 94 % (08/10 1314) FiO2 (%):  [40 %-55 %] 40 % (08/10 1314) Weight:  [146 lb (66.225 kg)-149 lb 7.6 oz (67.8 kg)] 149 lb 7.6 oz (67.8 kg) (08/10 0429) HEMODYNAMICS:   VENTILATOR SETTINGS: Vent Mode:  [-] BIPAP FiO2 (%):  [40 %-55 %] 40 % INTAKE / OUTPUT:  Intake/Output Summary (Last 24 hours) at 11/05/14 1324 Last data filed at 11/05/14 1213  Gross per 24 hour  Intake    250 ml  Output    675 ml  Net   -425 ml    PHYSICAL EXAMINATION: General:  Chronically  ill appearing white male, in respiratory distress Neuro:  Alert, not oriented, not verbalizing HEENT:  NCAT, PERRL Cardiovascular:  Tachycardic but regular, no r/m/g Lungs:  Diffuse crackles Abdomen:  Soft, NT, ND and +BS Musculoskeletal:  Normal ROM  Skin:  Warm, in tact  LABS:  CBC  Recent Labs Lab 11/17/2014 1530 11/05/14 0557  WBC 8.2 5.6  HGB 11.6* 10.3*  HCT 35.8* 31.3*  PLT 351 301   Coag's  Recent Labs Lab 11/20/2014 1600 11/05/14 1100  INR 1.66* 1.68*   BMET  Recent Labs Lab 11/03/2014 1530 11/05/14 0557  NA 138 137  K 4.0 4.3  CL 96* 95*  CO2 30 30  BUN 42* 38*  CREATININE 1.84* 1.81*  GLUCOSE 123* 154*   Electrolytes  Recent Labs Lab 11/10/2014 1530 11/05/14 0557  CALCIUM 9.4 9.4   Sepsis Markers  Recent Labs Lab 11/01/2014 1557 11/05/14 0005  LATICACIDVEN 1.57  --   PROCALCITON  --  0.17   ABG No results  for input(s): PHART, PCO2ART, PO2ART in the last 168 hours. Liver Enzymes  Recent Labs Lab 11/23/2014 1530  AST 19  ALT 11*  ALKPHOS 80  BILITOT 1.1  ALBUMIN 3.8   Cardiac Enzymes  Recent Labs Lab 11/13/2014 1530 11/05/14 0005 11/05/14 0557  TROPONINI 0.04* 0.04* 0.03   Glucose No results for input(s): GLUCAP in the last 168 hours.  Imaging X-ray Chest Pa And Lateral  11/05/2014   CLINICAL DATA:  Acute onset of shortness of breath. Initial encounter.  EXAM: CHEST  2 VIEW  COMPARISON:  Chest radiograph performed earlier today at 3:57 p.m.  FINDINGS: The lungs are well-aerated. Small bilateral pleural effusions are seen, left greater than right. Underlying vascular congestion is noted, with increased interstitial markings, concerning for mild pulmonary edema or possibly pneumonia, improved from the prior study. No pneumothorax is seen.  The heart is borderline normal in size. The patient is status post median sternotomy. A pacemaker is noted at the left chest wall, with leads ending at the right atrium and right ventricle. No acute osseous abnormalities are seen.  IMPRESSION: Interval improvement in underlying increased interstitial markings, which may reflect pulmonary edema or pneumonia. Small bilateral pleural effusions, left greater than right. Underlying vascular congestion noted.   Electronically Signed   By: Roanna Raider M.D.   On: 11/05/2014 03:47   Dg Chest Port 1 View  11/05/2014   CLINICAL DATA:  Hypoxia  EXAM: PORTABLE CHEST - 1 VIEW  COMPARISON:  10/27/2014  FINDINGS: Worsening bilateral airspace disease consistent with edema. Small right pleural effusion and small left pleural effusion.  Prior CABG.  Transvenous pacemaker unchanged.  IMPRESSION: Progression of diffuse bilateral airspace disease with small pleural effusions consistent with heart failure and edema.   Electronically Signed   By: Marlan Palau M.D.   On: 11/05/2014 12:32   Dg Chest Port 1 View  11/24/2014    CLINICAL DATA:  81 year old male with shortness of Breath. Current history of COPD, home oxygen requirement. Initial encounter.  EXAM: PORTABLE CHEST - 1 VIEW  COMPARISON:  10/08/2014 and earlier.  FINDINGS: Portable AP semi upright view at 1557 hours. Suspect widespread increased reticulonodular pulmonary opacity, more confluent in the right lung. This is an appearance similar to that in May of this year, but appeared to be cleared on the July comparison. Stable cardiac size and mediastinal contours. No pleural effusion. No pneumothorax. Calcified hilar lymph nodes. Cardiac valve prosthesis and prior sternotomy. Left chest cardiac pacemaker  IMPRESSION: Chronic lung disease with suspected acute bilateral infectious exacerbation in the form of increased reticulonodular opacity in both lungs since July. No pleural effusion.   Electronically Signed   By: Odessa Fleming M.D.   On: 2014-11-10 16:25     ASSESSMENT / PLAN:  PULMONARY OETT 8/10 >> A: Acute hypoxic respiratory failure Acute pulmonary edema secondary to HF vs. filling dysfunction due to afib with RVR Hx COPD Concern for HCAP P:   Mechanical ventilation ABG now Lasix for edema Solumedrol 60 q6 Duonebs q4 Recent hospitalization for HF exacerbation, cover for HCAP Control Afib rate - cards consult See ID  CARDIOVASCULAR CVL 8/10 >> A:  NICM >> chronic combined CHF, LVEF 25-30%, grade 2 DD, prosthetic AV Afib with RVR Hx HTN Permanent pacemaker HF exacerbation, BNP 1596 P:  Continue home Coreg for rate control Continue dilt drip for rate control Hold Coumadin, discuss with cards when to restart, INR 1.67 today Continue statin, plavix Heparin drip. D/C previous diureses orders On lasix 10 qd and torsemide 60 BID - d/c torsemide Lasix drip 5 mg/hr x24 hours  RENAL A:   CKD stage 3, Cr 1.81 baseline Volume overloaded P:   Neg balance for pulmonary edema Lasix drip 5 mg/hr x24 hours Hold IVF Replace electrolytes as  needed  GASTROINTESTINAL OGT 8/10 >> A:   GERD  P:   PPI Consult nutrition for TF  HEMATOLOGIC A:   Anemia of chronic disease, Hgb 10.3 Hx gout P:  Follow CBC On Plavix Holding Coumadin On home allopurinol  INFECTIOUS A:   Possible HCAP Normal WBC, midlly elevated PCT, normal lactate, afebrile  P:   BCx2 >>>  Abx:  Ceftazidime, start date 8/9 >> 8/10  Likely not infectious process, treating for pulmonary edema secondary to HF and afib with RVR causing diastolic dysfunction at this time. If clinically worsens, consider adding PNA coverage  ENDOCRINE A:   Monitor for hyperglycemia with steroids  P:   SSI  NEUROLOGIC A:   Sedated on vent Depression/anxiety P:   RASS goal: 0 Home Cymbalta   FAMILY  - Updates: Spoke with wife, ok for short intubation only but otherwise DNR.  The patient is critically ill with multiple organ systems failure and requires high complexity decision making for assessment and support, frequent evaluation and titration of therapies, application of advanced monitoring technologies and extensive interpretation of multiple databases.   Critical Care Time devoted to patient care services described in this note is  45  Minutes. This time reflects time of care of this signee Dr Koren Bound. This critical care time does not reflect procedure time, or teaching time or supervisory time of PA/NP/Med student/Med Resident etc but could involve care discussion time.  Alyson Reedy, M.D. [facility] Pulmonary/Critical Care Medicine. Pager: [phone]. After hours pager: [phone].

## 2014-11-05 NOTE — Consult Note (Signed)
Advanced Heart Failure Team Consult Note  Referring Physician: Eppie Gibson Primary Physician: Yong Channel Primary Cardiologist:  Lovena Le HF Cardiologist:  Aundra Dubin  Reason for Consultation: Acute hypoxic resp failure  HPI:    Timothy Durham is 81 y.o. male with h/o of Combined systolic/diastolic HF, ECHO 5/37 EF 25-30%, Grade 2 DD, PAF on chronic coumadin, s/p AVR (10/10), HTN, Hx of Stroke and TIA (x 3 .'00, '07, '14), CKD stage 3, s/p Bivi pacemaker 11/15.  He reported to [place] c/o SOB.  He had increasing dyspnea x 3-4 days.  Has worsening cough with occasional yellow sputum.  Denies fever, chills, N/V, CP, PND, or orthopnea.  His SOB is worse with exertion but occasionally has it at rest. He has improvement with albuterol inhaler.   He denies medical non-compliance or dietary indiscretion, although has been drinking increased fluids.   Felt near baseline at visit last week, but started feeling gradually worse after that.    Feels much better today after duonebs and CPAP yesterday.  Wife is frustrated that they don't have CPAP at home.  Couldn't get sleep study done until October  Review of Systems: [y] = yes, _0  = no   General: Weight gain _1 ; Weight loss _2 ; Anorexia _3 ; Fatigue [ y]; Fever _4 ; Chills _5 ; Weakness [ y]  Cardiac: Chest pain/pressure _6 ; Resting SOB [ y]; Exertional SOB _7 ; Orthopnea _8 ; Pedal Edema _9 ; Palpitations _10 ; Syncope _11 ; Presyncope _12 ; Paroxysmal nocturnal dyspnea_13   Pulmonary: Cough _14 ; Wheezing_15 ; Hemoptysis_16 ; Sputum Blue.Reese ]; Snoring _17   GI: Vomiting_18 ; Dysphagia_19 ; Melena_20 ; Hematochezia _21 ; Heartburn_22 ; Abdominal pain _23 ; Constipation _24 ; Diarrhea _25 ; BRBPR _26   GU: Hematuria_27 ; Dysuria _28 ; Nocturia_29   Vascular: Pain in legs with walking _30 ; Pain in feet with lying flat _31 ; Non-healing sores _32 ; Stroke _33 ; TIA _34 ; Slurred speech _35 ;  Neuro: Headaches_36 ; Vertigo_37 ; Seizures_38 ; Paresthesias_39 ;Blurred vision _40 ; Diplopia _41 ;  Vision changes _42   Ortho/Skin: Arthritis Blue.Reese ]; Joint pain [ y]; Muscle pain _43 ; Joint swelling _44 ; Back Pain _45 ; Rash _46   Psych: Depression_47 ; Anxiety_48   Heme: Bleeding problems _49 ; Clotting disorders _50 ; Anemia _51   Endocrine: Diabetes _52 ; Thyroid dysfunction_53   Home Medications Prior to Admission medications   Medication Sig Start Date End Date Taking? Authorizing Provider  acetaminophen (TYLENOL) 500 MG tablet Take 1,000 mg by mouth at bedtime.    Yes Historical Provider, MD  allopurinol (ZYLOPRIM) 100 MG tablet Take 200 mg by mouth daily.   Yes Historical Provider, MD  atorvastatin (LIPITOR) 40 MG tablet Take 40 mg by mouth every evening.   Yes Historical Provider, MD  Calcium Carb-Cholecalciferol (CALCIUM 600 + D PO) Take 2 tablets by mouth every morning.   Yes Historical Provider, MD  carvedilol (COREG) 6.25 MG tablet Take 1 tablet (6.25 mg total) by mouth 2 (two) times daily with a meal. 09/12/14  Yes Larey Dresser, MD  clopidogrel (PLAVIX) 75 MG tablet Take 75 mg by mouth daily.   Yes Historical Provider, MD  DULoxetine (CYMBALTA) 60 MG capsule Take 60 mg by mouth daily.  03/18/14  Yes Historical Provider, MD  eplerenone (INSPRA) 25 MG tablet Take 1 tablet (25 mg total) by mouth daily. 10/29/14  Yes Larey Dresser, MD  Omega-3 Fatty Acids (FISH OIL) 1200 MG CAPS Take 1 capsule by mouth every morning.   Yes Historical Provider, MD  omeprazole (PRILOSEC) 40 MG capsule Take 40 mg by mouth every morning.   Yes Historical Provider, MD  tamsulosin (FLOMAX) 0.4 MG CAPS capsule Take 2 capsules (0.8 mg total) by mouth at bedtime. 08/07/14  Yes Velvet Bathe, MD  torsemide (DEMADEX) 20 MG tablet Take 3 tablets (60 mg total) by mouth 2 (two) times daily. 60 mg in the morning, 40 mg in the evening 10/29/14  Yes Larey Dresser, MD  warfarin (COUMADIN) 2 MG tablet Take 2 mg by mouth daily.   Yes Historical Provider, MD  albuterol (PROVENTIL HFA;VENTOLIN HFA) 108 (90 BASE) MCG/ACT inhaler  Inhale 2 puffs into the lungs every 6 (six) hours as needed for wheezing or shortness of breath. 10/29/14   Larey Dresser, MD  potassium chloride SA (K-DUR,KLOR-CON) 20 MEQ tablet Take 1 tablet (20 mEq total) by mouth as needed. TAKE ONLY WHEN YOU TAKE METOLAZONE 04/10/14   Jolaine Artist, MD    Past Medical History: Past Medical History  Diagnosis Date  . Arthritis   . Non-obstructive CAD     a. 12/2013 NSTEMI/Cath: LM nl, LAD 20, LCX 40-50, RCA 20.  Marland Kitchen Hypertension   . Stroke     a. July 2000;  b. January 2007;  c. TIA in 2014.  . CKD (chronic kidney disease), stage III   . GERD (gastroesophageal reflux disease)   . Foot pain, bilateral   . H/O hematuria   . Hyperlipidemia   . Vitamin D deficiency   . Chronic combined systolic and diastolic CHF (congestive heart failure)     a. 12/2013 Echo: EF 30-35%, mod conc LVH, Gr 3 DD, mild AI, mod-sev MR, mod dil LA, mild TR.  Marland Kitchen PAF (paroxysmal atrial fibrillation)     a. CHA2DS2VASc = 7--> Chronic Coumadin.  Marland Kitchen NICM (nonischemic cardiomyopathy)     a. 12/2013 Echo: EF 30-35%.  . Aortic valve prosthesis present     a. 2006: S/P bioprosthetic AVR.  Marland Kitchen PAD (peripheral artery disease)     a. s/p LLE stenting.  . Carotid arterial disease     a. s/p R CEA  . History of tobacco abuse   . Moderate to Severe Mitral Regurgitation     a. 12/2013 Echo: Mod-Sev MR.  Marland Kitchen Anxiety   . COPD (chronic obstructive pulmonary disease)   . Coronary arteriosclerosis Oct 2015    med Rx  . Shortness of breath dyspnea   . Depression   . Presence of permanent cardiac pacemaker   . Heart murmur   . Dysrhythmia     Past Surgical History: Past Surgical History  Procedure Laterality Date  . Aortic valve replacement (avr)/coronary artery bypass grafting (cabg)  12/2008  . Eye surgery    . Carotid angiogram  1996  . Left lower extremity stent    . Carotid endarterectomy    . Insert / replace / remove pacemaker    . Coronary angiogram  Oct 2015  .  Pacemaker insertion  Nov 2015    MDT BiV  . Left and right heart catheterization with coronary angiogram N/A 01/14/2014    Procedure: LEFT AND RIGHT HEART CATHETERIZATION WITH CORONARY ANGIOGRAM;  Surgeon: Peter M Martinique, MD;  Location: [facility] LAB;  Service: Cardiovascular;  Laterality: N/A;  . Bi-ventricular pacemaker insertion N/A 02/17/2014    Procedure: BI-VENTRICULAR PACEMAKER INSERTION (CRT-P);  Surgeon: Champ Mungo  Lovena Le, MD;  Location: [facility] CATH LAB;  Service: Cardiovascular;  Laterality: N/A;  . Coronary angioplasty    . Tonsillectomy      Family History: Family History  Problem Relation Age of Onset  . Stroke Mother   . Cancer Father     Social History: Social History   Social History  . Marital Status: Married    Spouse Name: N/A  . Number of Children: N/A  . Years of Education: N/A   Social History Main Topics  . Smoking status: Former Smoker -- 2.00 packs/day for 35 years    Types: Cigarettes    Quit date: 09/26/1998  . Smokeless tobacco: Never Used  . Alcohol Use: 0.6 oz/week    1 Cans of beer per week     Comment: drinks one can of beer or wine daily.  . Drug Use: No  . Sexual Activity: Not Asked   Other Topics Concern  . None   Social History Narrative   -lives with wife in [place] (09/2014)    Allergies:  Allergies  Allergen Reactions  . Ambien [Zolpidem] Other (See Comments)    Hallucinations  . Ativan [Lorazepam] Other (See Comments)    hallucinations  . Oxycontin [Oxycodone] Other (See Comments)    hallucinations  . Amiodarone   . Penicillins     Unknown childhood reaction   . Sulfa Antibiotics     unknown    Objective:    Vital Signs:   Temp:  [97.5 F (36.4 C)-98.1 F (36.7 C)] 98.1 F (36.7 C) (08/10 0752) Pulse Rate:  [92-121] 102 (08/10 0752) Resp:  [17-48] 18 (08/10 0752) BP: (106-152)/(31-70) 138/50 mmHg (08/10 0752) SpO2:  [83 %-100 %] 98 % (08/10 0752) FiO2 (%):  [40 %-55 %] 45 % (08/09 1801) Weight:  [146 lb (66.225  kg)-149 lb 7.6 oz (67.8 kg)] 149 lb 7.6 oz (67.8 kg) (08/10 0429) Last BM Date: 11/03/14  Weight change: Filed Weights   11/12/2014 1519 11/25/2014 2215 11/05/14 0429  Weight: 146 lb (66.225 kg) 149 lb 7.6 oz (67.8 kg) 149 lb 7.6 oz (67.8 kg)    Intake/Output:   Intake/Output Summary (Last 24 hours) at 11/05/14 6812 Last data filed at 11/05/14 0500  Gross per 24 hour  Intake      0 ml  Output    675 ml  Net   -675 ml     Physical Exam: General:  Elderly. No resp difficulty HEENT: normal Neck: supple. JVP 6-7 . Carotids 2+ bilat; no bruits. No lymphadenopathy or thryomegaly appreciated. Cor: PMI nondisplaced. Regular rate & rhythm. No rubs, gallops. 2/6 HSM LLSB/apex Lungs: Decreased bilaterally with slight crackles R>L. Abdomen: soft, nontender, nondistended. No hepatosplenomegaly. No bruits or masses. Good bowel sounds. Extremities: no cyanosis, clubbing, rash. No edema Neuro: alert & orientedx3, cranial nerves grossly intact. moves all 4 extremities w/o difficulty. Affect pleasant  Telemetry: V Paced 100-110s  Labs: Basic Metabolic Panel:  Recent Labs Lab 10/29/14 1225 11/09/2014 1530 11/05/14 0557  NA 138 138 137  K 3.8 4.0 4.3  CL 98* 96* 95*  CO2 _0 GLUCOSE 129* 123* 154*  BUN 42* 42* 38*  CREATININE 1.96* 1.84* 1.81*  CALCIUM 9.2 9.4 9.4    Liver Function Tests:  Recent Labs Lab 10/28/2014 1530  AST 19  ALT 11*  ALKPHOS 80  BILITOT 1.1  PROT 7.2  ALBUMIN 3.8   No results for input(s): LIPASE, AMYLASE in the last 168 hours. No results for  input(s): AMMONIA in the last 168 hours.  CBC:  Recent Labs Lab 11/24/2014 1530 11/05/14 0557  WBC 8.2 5.6  NEUTROABS 6.4  --   HGB 11.6* 10.3*  HCT 35.8* 31.3*  MCV 93.7 92.3  PLT 351 301    Cardiac Enzymes:  Recent Labs Lab 11/19/2014 1530 11/05/14 0005 11/05/14 0557  TROPONINI 0.04* 0.04* 0.03    BNP: BNP (last 3 results)  Recent Labs  10/07/14 1300 10/29/14 1225 11/23/2014 1530  BNP  1758.5* 1266.9* 1596.1*    ProBNP (last 3 results)  Recent Labs  02/24/14 1606 02/26/14 1623 03/06/14 1129  PROBNP 2973.0* 18948.0* 5943.0*     CBG: No results for input(s): GLUCAP in the last 168 hours.  Coagulation Studies:  Recent Labs  11/14/2014 1600  LABPROT 19.6*  INR 1.66*    Other results: EKG: V paced 110s  Imaging: X-ray Chest Pa And Lateral  11/05/2014   CLINICAL DATA:  Acute onset of shortness of breath. Initial encounter.  EXAM: CHEST  2 VIEW  COMPARISON:  Chest radiograph performed earlier today at 3:57 p.m.  FINDINGS: The lungs are well-aerated. Small bilateral pleural effusions are seen, left greater than right. Underlying vascular congestion is noted, with increased interstitial markings, concerning for mild pulmonary edema or possibly pneumonia, improved from the prior study. No pneumothorax is seen.  The heart is borderline normal in size. The patient is status post median sternotomy. A pacemaker is noted at the left chest wall, with leads ending at the right atrium and right ventricle. No acute osseous abnormalities are seen.  IMPRESSION: Interval improvement in underlying increased interstitial markings, which may reflect pulmonary edema or pneumonia. Small bilateral pleural effusions, left greater than right. Underlying vascular congestion noted.   Electronically Signed   By: Garald Balding M.D.   On: 11/05/2014 03:47   Dg Chest Port 1 View  11/10/2014   CLINICAL DATA:  81 year old male with shortness of Breath. Current history of COPD, home oxygen requirement. Initial encounter.  EXAM: PORTABLE CHEST - 1 VIEW  COMPARISON:  10/08/2014 and earlier.  FINDINGS: Portable AP semi upright view at 1557 hours. Suspect widespread increased reticulonodular pulmonary opacity, more confluent in the right lung. This is an appearance similar to that in May of this year, but appeared to be cleared on the July comparison. Stable cardiac size and mediastinal contours. No pleural  effusion. No pneumothorax. Calcified hilar lymph nodes. Cardiac valve prosthesis and prior sternotomy. Left chest cardiac pacemaker  IMPRESSION: Chronic lung disease with suspected acute bilateral infectious exacerbation in the form of increased reticulonodular opacity in both lungs since July. No pleural effusion.   Electronically Signed   By: Genevie Ann M.D.   On: 11/11/2014 16:25      Medications:     Current Medications: . allopurinol  200 mg Oral Daily  . atorvastatin  40 mg Oral QPM  . clopidogrel  75 mg Oral Daily  . DULoxetine  60 mg Oral Daily  . pantoprazole  40 mg Oral Daily  . predniSONE  40 mg Oral Q breakfast  . sodium chloride  3 mL Intravenous Q12H  . Warfarin - Pharmacist Dosing Inpatient   Does not apply q1800     Infusions:      Assessment/Plan   1. Acute hypoxic respiratory failure 2. 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 3. PAF - pacer 4. COPD 5. CKD stage 3 6. H/o TIA 07/2013  CXR shows increased interstitial markings. Pulmonary edema or  pneumonia. Received Vanc and Ceftazidime yesterday in ER for possible HCAP.   Cr stable, down from previous admission.  Also at his discharge weight of 149 lb. Appears to be related to COPD exacerbation more than HF with volume status.  Agree with IV lasix dose yesterday, will transition back to home torsemide today.     Will follow volume status and cr.  Length of Stay: 1  Shirley Friar PA-C 11/05/2014, 8:22 AM  Advanced Heart Failure Team Pager [phone] (M-F; 7a - 4p)  Please contact [facility] for night-coverage after hours (4p -7a ) and weekends on [email or web address]  Patient seen and examined with Oda Kilts, PA-C. We discussed all aspects of the encounter. I agree with the assessment and plan as stated above.   He has deteriorated throughout the day and is now intubated. CVP 7. Degree of respiratory distress seems far out of proportion to volume status. I worry about HCAP versus  pneumonitis. PCT is low making PNA less likely. I have reviewed ECGs and tele appears to have possible paroxysmal AFL vs sinus tach with PACs. Rate now better after intubation. CCM has seen. Started broad spectrum abx and steroids. I will check ESR and will likely need CT chest.   Corey Laski,MD 5:36 PM

## 2014-11-05 NOTE — Telephone Encounter (Signed)
I spoke with pt's wife and advised her that I am mailing her husband a Sleep Packet with info that he needs including the date for his study; verified pt's mailing address.

## 2014-11-05 NOTE — Procedures (Signed)
Central Venous Catheter Insertion Procedure Note Timothy Durham [id number] Nov 23, 1930  Procedure: Insertion of Central Venous Catheter Indications: Assessment of intravascular volume, Drug and/or fluid administration and Frequent blood sampling  Procedure Details Consent: Risks of procedure as well as the alternatives and risks of each were explained to the (patient/caregiver).  Consent for procedure obtained. Time Out: Verified patient identification, verified procedure, site/side was marked, verified correct patient position, special equipment/implants available, medications/allergies/relevent history reviewed, required imaging and test results available.  Performed  Maximum sterile technique was used including antiseptics, cap, gloves, gown, hand hygiene, mask and sheet. Skin prep: Chlorhexidine; local anesthetic administered A antimicrobial bonded/coated triple lumen catheter was placed in the right internal jugular vein using the Seldinger technique.  Evaluation Blood flow good Complications: No apparent complications Patient did tolerate procedure well. Chest X-ray ordered to verify placement.  CXR: pending.  U/S used in placement.  Timothy Durham 11/05/2014, 3:26 PM

## 2014-11-05 NOTE — Progress Notes (Signed)
ANTICOAGULATION CONSULT NOTE - Follow Up Consult  Pharmacy Consult for Heparin and Coumadin Indication: atrial fibrillation  Allergies  Allergen Reactions  . Ambien [Zolpidem] Other (See Comments)    Hallucinations  . Ativan [Lorazepam] Other (See Comments)    hallucinations  . Oxycontin [Oxycodone] Other (See Comments)    hallucinations  . Amiodarone     Possible pulmonary toxicity  . Penicillins     Unknown childhood reaction   . Spironolactone     Gynecomastia, tolerates Eplerenone  . Sulfa Antibiotics     unknown    Patient Measurements: Height: 5\' 10"  (177.8 cm) Weight: 149 lb 7.6 oz (67.8 kg) IBW/kg (Calculated) : 73 Heparin Dosing Weight: 67.8 kg  Vital Signs: Temp: 98 F (36.7 C) (08/10 1314) Temp Source: Axillary (08/10 1314) BP: 141/57 mmHg (08/10 1445) Pulse Rate: 115 (08/10 1500)  Labs:  Recent Labs  11/14/2014 1530 11/16/2014 1600 11/05/14 0005 11/05/14 0557 11/05/14 1100 11/05/14 1254  HGB 11.6*  --   --  10.3*  --   --   HCT 35.8*  --   --  31.3*  --   --   PLT 351  --   --  301  --   --   LABPROT  --  19.6*  --   --  19.8*  --   INR  --  1.66*  --   --  1.68*  --   CREATININE 1.84*  --   --  1.81*  --   --   TROPONINI 0.04*  --  0.04* 0.03  --  0.05*    Estimated Creatinine Clearance: 30.2 mL/min (by C-G formula based on Cr of 1.81).  Assessment:   Rx dosed Coumadin earlier today for afib.  Now on vent.   INR is subtherapeutic at 1.68.  To add heparin until INR is at goal.   Home Coumadin regimen: 2 mg daily.  Goal of Therapy:  INR 2-3 Heparin level 0.3-0.7 units/ml Monitor platelets by anticoagulation protocol: Yes   Plan:   Heparin 3000 units IV x 1.  Heparin drip to begin at 950 units/hr.  Heparin level ~ 8hr after drip begins.  Coumadin 3 mg already ordered for today.  Daily heparin level, PT/INR and CBC.  Dennie Fetters, RPh Pager: [phone] 11/05/2014,4:11 PM

## 2014-11-06 ENCOUNTER — Ambulatory Visit (HOSPITAL_COMMUNITY): Payer: Medicare HMO

## 2014-11-06 ENCOUNTER — Inpatient Hospital Stay (HOSPITAL_COMMUNITY): Payer: Medicare HMO

## 2014-11-06 LAB — BASIC METABOLIC PANEL
ANION GAP: 13 (ref 5–15)
BUN: 50 mg/dL — ABNORMAL HIGH (ref 6–20)
CALCIUM: 9.2 mg/dL (ref 8.9–10.3)
CO2: 30 mmol/L (ref 22–32)
Chloride: 94 mmol/L — ABNORMAL LOW (ref 101–111)
Creatinine, Ser: 2.15 mg/dL — ABNORMAL HIGH (ref 0.61–1.24)
GFR, EST AFRICAN AMERICAN: 31 mL/min — AB (ref >60)
GFR, EST NON AFRICAN AMERICAN: 27 mL/min — AB (ref >60)
Glucose, Bld: 166 mg/dL — ABNORMAL HIGH (ref 65–99)
Potassium: 4.4 mmol/L (ref 3.5–5.1)
Sodium: 137 mmol/L (ref 135–145)

## 2014-11-06 LAB — BLOOD GAS, ARTERIAL
Acid-Base Excess: 4 mmol/L — ABNORMAL HIGH (ref 0.0–2.0)
Bicarbonate: 28.2 mEq/L — ABNORMAL HIGH (ref 20.0–24.0)
Drawn by: 31814
FIO2: 0.5
O2 SAT: 98 %
PCO2 ART: 40.7 mmHg (ref 35.0–45.0)
PEEP: 12 cmH2O
Patient temperature: 96
RATE: 18 resp/min
TCO2: 29.5 mmol/L (ref 0–100)
VT: 500 mL
pH, Arterial: 7.447 (ref 7.350–7.450)
pO2, Arterial: 180 mmHg — ABNORMAL HIGH (ref 80.0–100.0)

## 2014-11-06 LAB — PROTIME-INR
INR: 2.22 — ABNORMAL HIGH (ref 0.00–1.49)
Prothrombin Time: 24.4 seconds — ABNORMAL HIGH (ref 11.6–15.2)

## 2014-11-06 LAB — PROCALCITONIN: Procalcitonin: 0.37 ng/mL

## 2014-11-06 LAB — CARBOXYHEMOGLOBIN
Carboxyhemoglobin: 1.3 % (ref 0.5–1.5)
METHEMOGLOBIN: 1.5 % (ref 0.0–1.5)
O2 Saturation: 79.5 %
Total hemoglobin: 10.7 g/dL — ABNORMAL LOW (ref 13.5–18.0)

## 2014-11-06 LAB — CBC
HCT: 32.9 % — ABNORMAL LOW (ref 39.0–52.0)
Hemoglobin: 10.6 g/dL — ABNORMAL LOW (ref 13.0–17.0)
MCH: 29.9 pg (ref 26.0–34.0)
MCHC: 32.2 g/dL (ref 30.0–36.0)
MCV: 92.7 fL (ref 78.0–100.0)
PLATELETS: 319 10*3/uL (ref 150–400)
RBC: 3.55 MIL/uL — AB (ref 4.22–5.81)
RDW: 16.5 % — ABNORMAL HIGH (ref 11.5–15.5)
WBC: 18.1 10*3/uL — AB (ref 4.0–10.5)

## 2014-11-06 LAB — TROPONIN I
TROPONIN I: 0.28 ng/mL — AB (ref <0.031)
TROPONIN I: 0.4 ng/mL — AB (ref <0.031)
TROPONIN I: 0.53 ng/mL — AB (ref <0.031)
Troponin I: 0.36 ng/mL — ABNORMAL HIGH (ref <0.031)

## 2014-11-06 LAB — PHOSPHORUS: Phosphorus: 6.2 mg/dL — ABNORMAL HIGH (ref 2.5–4.6)

## 2014-11-06 LAB — MAGNESIUM: MAGNESIUM: 2.1 mg/dL (ref 1.7–2.4)

## 2014-11-06 LAB — HEPARIN LEVEL (UNFRACTIONATED): Heparin Unfractionated: 0.32 IU/mL (ref 0.30–0.70)

## 2014-11-06 MED ORDER — PRO-STAT SUGAR FREE PO LIQD
30.0000 mL | Freq: Every day | ORAL | Status: DC
Start: 2014-11-06 — End: 2014-11-07
  Filled 2014-11-06: qty 30

## 2014-11-06 MED ORDER — PANTOPRAZOLE SODIUM 40 MG IV SOLR
40.0000 mg | INTRAVENOUS | Status: DC
  Administered 2014-11-06: 40 mg via INTRAVENOUS
  Filled 2014-11-06 (×2): qty 40

## 2014-11-06 MED ORDER — VITAL AF 1.2 CAL PO LIQD
1000.0000 mL | ORAL | Status: DC
  Filled 2014-11-06 (×2): qty 1000

## 2014-11-06 MED ORDER — FUROSEMIDE 10 MG/ML IJ SOLN
5.0000 mg/h | INTRAVENOUS | Status: DC
  Filled 2014-11-06: qty 25

## 2014-11-06 MED ORDER — VITAL HIGH PROTEIN PO LIQD: 1000.0000 mL | ORAL | Status: DC

## 2014-11-06 MED ORDER — METOLAZONE 5 MG PO TABS
5.0000 mg | ORAL_TABLET | Freq: Every day | ORAL | Status: AC
  Administered 2014-11-06: 5 mg via ORAL
  Filled 2014-11-06: qty 2

## 2014-11-06 NOTE — Progress Notes (Signed)
Pharmacist Heart Failure Core Measure Documentation  Assessment: Timothy Durham has an EF documented as 25-30% on 08/02/14 by Echo.  Rationale: Heart failure patients with left ventricular systolic dysfunction (LVSD) and an EF < 40% should be prescribed an angiotensin converting enzyme inhibitor (ACEI) or angiotensin receptor blocker (ARB) at discharge unless a contraindication is documented in the medical record.  This patient is not currently on an ACEI or ARB for HF.  This note is being placed in the record in order to provide documentation that a contraindication to the use of these agents is present for this encounter.  ACE Inhibitor or Angiotensin Receptor Blocker is contraindicated (specify all that apply)  []   ACEI allergy AND ARB allergy []   Angioedema []   Moderate or severe aortic stenosis []   Hyperkalemia []   Hypotension []   Renal artery stenosis [x]   Worsening renal function, preexisting renal disease or dysfunction  Harland German, Pharm D 11/06/2014 1:25 PM

## 2014-11-06 NOTE — Progress Notes (Signed)
CRITICAL VALUE ALERT  Critical value received:  Troponin 0.53  Date of notification:  11/06/2014  Time of notification:  2:03 AM  Critical value read back:Yes.    Called E-Link and informed MD of troponin value. Ivery Quale, RN

## 2014-11-06 NOTE — Progress Notes (Signed)
Utilization review completed. Kaemon Barnett, RN, BSN. 

## 2014-11-06 NOTE — Progress Notes (Addendum)
ANTICOAGULATION CONSULT NOTE - Follow Up Consult  Pharmacy Consult for Heparin and Coumadin Indication: atrial fibrillation  Allergies  Allergen Reactions  . Ambien [Zolpidem] Other (See Comments)    Hallucinations  . Ativan [Lorazepam] Other (See Comments)    hallucinations  . Oxycontin [Oxycodone] Other (See Comments)    hallucinations  . Amiodarone     Possible pulmonary toxicity  . Penicillins     Unknown childhood reaction   . Spironolactone     Gynecomastia, tolerates Eplerenone  . Sulfa Antibiotics     unknown    Patient Measurements: Height: 5\' 10"  (177.8 cm) Weight: 144 lb 13.5 oz (65.7 kg) IBW/kg (Calculated) : 73 Heparin Dosing Weight: 67.8 kg  Vital Signs: Temp: 96 F (35.6 C) (08/11 0023) Temp Source: Axillary (08/11 0023) BP: 111/59 mmHg (08/11 0023) Pulse Rate: 86 (08/11 0333)  Labs:  Recent Labs  11/19/2014 1530 10/27/2014 1600  11/05/14 0557 11/05/14 1100 11/05/14 1254 11/05/14 1810 11/06/14 0015 11/06/14 0345  HGB 11.6*  --   --  10.3*  --   --   --   --  10.6*  HCT 35.8*  --   --  31.3*  --   --   --   --  32.9*  PLT 351  --   --  301  --   --   --   --  319  LABPROT  --  19.6*  --   --  19.8*  --   --   --  24.4*  INR  --  1.66*  --   --  1.68*  --   --   --  2.22*  HEPARINUNFRC  --   --   --   --   --   --   --   --  0.32  CREATININE 1.84*  --   --  1.81*  --   --   --   --   --   TROPONINI 0.04*  --   < > 0.03  --  0.05* 0.37* 0.53*  --   < > = values in this interval not displayed.  Estimated Creatinine Clearance: 29.2 mL/min (by C-G formula based on Cr of 1.81).  Assessment: INR now back to therapeutic (2.22) -relatively large increase past 24 hours. Heparin bridge remains therapeutic on 950 units/hr. CBC stable. No bleeding noted.  Goal of Therapy:  INR 2-3 Heparin level 0.3-0.7 units/ml Monitor platelets by anticoagulation protocol: Yes   Plan:  Coumadin 2mg  daily Continue heparin 950 units/hr Consider d/c heparin with  therapeutic INR Daily heparin level, PT/INR and CBC.  Christoper Fabian, PharmD, BCPS Clinical pharmacist, pager [phone] 11/06/2014,5:09 AM   Addendum: Coumadin now on hold. Heparin to continue, however, will discontinue today as INR is 2.2. Will follow up daily INR and resume heparin at 950 units/hr once INR falls below 2.  Louie Casa, PharmD, BCPS 11/06/2014, 11:03 AM

## 2014-11-06 NOTE — Progress Notes (Signed)
PULMONARY / CRITICAL CARE MEDICINE   Name: Jensyn Cambria MRN: [id number] DOB: December 10, 1930    ADMISSION DATE:  11/17/2014 CONSULTATION DATE:  11/05/2014  REFERRING MD :  Josem Kaufmann  CHIEF COMPLAINT:  Dyspnea  INITIAL PRESENTATION: 81 yo male with hx of HTN, COPD, HF, presented to ED on 8/9 with dyspnea x 4 days, worsening SOB, increased sputum. Patient was admitted to SDU, placed on CPAP. On 8/10 he had markedly worse edema on CXR and dyspnea, PCCM was consulted. Patient was intubated and central line was placed.  STUDIES:  5/7 Echo: LVEF 25-30%, grade 2 DD, prosthetic AV, PAP 33  SIGNIFICANT EVENTS: 8/10 Dyspnea worsened, patient intubated, CVL placed  HISTORY OF PRESENT ILLNESS:  Mr. Koua Deeg is a 82 y.o. male w/ PMHx of HTN, HLD, COPD, PAF on Coumadin, h/o AVR (bioprosthetic), chronic combined CHF, PVD, and GERD, presents to the [facility] ED w/ complaints of worsening SOB. Patient states he has had increasing dyspnea for the past 3-4 days which became so severe he felt he needed to come to the ED. He was out walking with his wife, went to the farmer's market, and [employer] and states he simply could not catch his breath. The patient does note he has baseline dyspnea but states today it was simply unbearable. He also notes worsening cough somewhat productive of a clear and sometimes yellowish sputum. Patient denies fever, chills, nausea, vomiting, chest pain, PND, or orthopnea. He does admit that the SOB is worse w/ exertion but also states that he had it at rest today which was improved w/ albuterol inhaler. He denies sick contacts, non-compliance w/ diuretics at home, or weight gain since his last admission in 09/2014.   On admission patient was diagnosed with acute hypoxic respiratory failure placed on BiPAP, treated for COPD exacerbation, HF exacerbation, and CHAP. On 8/10 patient had sudden onset of increased dyspnea and WOB and PCCM was consulted. Patient was intubated and admitted to the  CCU.  SUBJECTIVE: No events overnight, BP stable.  VITAL SIGNS: Temp:  [96 F (35.6 C)-98.1 F (36.7 C)] 96 F (35.6 C) (08/11 0023) Pulse Rate:  [50-141] 86 (08/11 0333) Resp:  [13-39] 18 (08/11 0333) BP: (101-182)/(37-70) 111/59 mmHg (08/11 0023) SpO2:  [90 %-100 %] 100 % (08/11 0333) Arterial Line BP: (114-181)/(40-90) 119/44 mmHg (08/11 0200) FiO2 (%):  [40 %-100 %] 50 % (08/11 0333) Weight:  [65.7 kg (144 lb 13.5 oz)] 65.7 kg (144 lb 13.5 oz) (08/11 0500) HEMODYNAMICS: CVP:  [8 mmHg] 8 mmHg VENTILATOR SETTINGS: Vent Mode:  [-] PRVC FiO2 (%):  [40 %-100 %] 50 % Set Rate:  [18 bmp] 18 bmp Vt Set:  [500 mL] 500 mL PEEP:  [12 cmH20] 12 cmH20 Plateau Pressure:  [24 cmH20-27 cmH20] 24 cmH20 INTAKE / OUTPUT:  Intake/Output Summary (Last 24 hours) at 11/06/14 0554 Last data filed at 11/06/14 0300  Gross per 24 hour  Intake    461 ml  Output   1435 ml  Net   -974 ml    PHYSICAL EXAMINATION: General:  Chronically ill appearing white male, sedated on vent. Neuro:  Sedated, withdraws all ext to pain. HEENT:  NCAT, PERRL, EOM-spontaneous and MMM. Cardiovascular:  RRR, Nl S1/S2, -M/R/G. Lungs:  Diffuse crackles. Abdomen:  Soft, NT, ND and +BS Musculoskeletal:  Normal ROM  Skin:  Warm, in tact  LABS:  CBC  Recent Labs Lab 10/29/2014 1530 11/05/14 0557 11/06/14 0345  WBC 8.2 5.6 18.1*  HGB 11.6* 10.3* 10.6*  HCT 35.8* 31.3* 32.9*  PLT 351 301 319   Coag's  Recent Labs Lab 11/21/2014 1600 11/05/14 1100 11/06/14 0345  INR 1.66* 1.68* 2.22*   BMET  Recent Labs Lab 11/14/2014 1530 11/05/14 0557 11/06/14 0345  NA 138 137 137  K 4.0 4.3 4.4  CL 96* 95* 94*  CO2 30 30 30   BUN 42* 38* 50*  CREATININE 1.84* 1.81* 2.15*  GLUCOSE 123* 154* 166*   Electrolytes  Recent Labs Lab 10/30/2014 1530 11/05/14 0557 11/06/14 0345  CALCIUM 9.4 9.4 9.2  MG  --   --  2.1  PHOS  --   --  6.2*   Sepsis Markers  Recent Labs Lab 11/06/2014 1557 11/05/14 0005   LATICACIDVEN 1.57  --   PROCALCITON  --  0.17   ABG  Recent Labs Lab 11/05/14 1613 11/06/14 0430  PHART 7.361 7.447  PCO2ART 56.2* 40.7  PO2ART 275.0* 180*   Liver Enzymes  Recent Labs Lab 11/17/2014 1530  AST 19  ALT 11*  ALKPHOS 80  BILITOT 1.1  ALBUMIN 3.8   Cardiac Enzymes  Recent Labs Lab 11/05/14 1254 11/05/14 1810 11/06/14 0015  TROPONINI 0.05* 0.37* 0.53*   Glucose  Recent Labs Lab 11/05/14 1638  GLUCAP 168*    Imaging Dg Chest Port 1 View  11/05/2014   CLINICAL DATA:  Endotracheal tube placement. Central line placement.  EXAM: PORTABLE CHEST - 1 VIEW  COMPARISON:  11/05/2014 and 10/27/2014.  FINDINGS: 1556 hours interval intubation. The endotracheal tube is in the midtrachea. There is a new right IJ central venous catheter. Its tip is not well visualized, overlapping the left subclavian pacemaker leads which extend into the right atrium and right ventricle. Nasogastric tube projects below the diaphragm, tip not visualized. Pulmonary edema has mildly improved. There is stable cardiomegaly status post median sternotomy and aortic valve replacement. There are small bilateral pleural effusions. No pneumothorax.  IMPRESSION: 1. Satisfactorily positioned endotracheal and nasogastric tubes. 2. Tip of the right IJ central venous catheter overlaps the pacemaker leads and is not well seen. 3. Improved pulmonary edema.   Electronically Signed   By: Carey Bullocks M.D.   On: 11/05/2014 16:06   Dg Chest Port 1 View  11/05/2014   CLINICAL DATA:  Hypoxia  EXAM: PORTABLE CHEST - 1 VIEW  COMPARISON:  11/24/2014  FINDINGS: Worsening bilateral airspace disease consistent with edema. Small right pleural effusion and small left pleural effusion.  Prior CABG.  Transvenous pacemaker unchanged.  IMPRESSION: Progression of diffuse bilateral airspace disease with small pleural effusions consistent with heart failure and edema.   Electronically Signed   By: Marlan Palau M.D.   On:  11/05/2014 12:32     ASSESSMENT / PLAN:  PULMONARY OETT 8/10 >> A: Acute hypoxic respiratory failure Acute pulmonary edema secondary to HF vs. filling dysfunction due to afib with RVR Hx COPD Concern for HCAP P:   Mechanical ventilation decrease PEEP to 8, FiO2 to 40% and RR to 12. Lasix for edema Solumedrol 60 q6 Duonebs q4 D/C abx. Control Afib rate - cards consult See ID  CARDIOVASCULAR CVL 8/10 >> A:  NICM >> chronic combined CHF, LVEF 25-30%, grade 2 DD, prosthetic AV Afib with RVR Hx HTN Permanent pacemaker HF exacerbation, BNP 1596 P:  Continue home Coreg for rate control D/Continue dilt drip Hold Coumadin, on heparin drip, INR is 2.2 this AM however, will repeat in AM. Continue statin, plavix Heparin drip, will defer to cards. On lasix 10 qd and  torsemide 60 BID - d/c both Lasix drip 5 mg/hr x24 hours  RENAL A:   CKD stage 3, Cr 1.81 baseline Volume overloaded P:   Neg balance for pulmonary edema Lasix drip 5 mg/hr x24 hours Zaroxolyn 5 mg PO x1. Hold IVF Replace electrolytes as needed  GASTROINTESTINAL OGT 8/10 >> A:   GERD  P:   PPI TF per nutrition  HEMATOLOGIC A:   Anemia of chronic disease, Hgb 10.3 Hx gout P:  Follow CBC On Plavix Holding Coumadin On home allopurinol  INFECTIOUS A:   Possible HCAP Normal WBC, midlly elevated PCT, normal lactate, afebrile  P:   BCx2 >>>  Abx:  Ceftazidime, start date 8/9 >> 8/10  Likely not infectious process, treating for pulmonary edema secondary to HF and afib with RVR causing diastolic dysfunction at this time. If clinically worsens, consider adding PNA coverage with PCT of 0.17, d/c abx.  ENDOCRINE A:   Monitor for hyperglycemia with steroids  P:   SSI  NEUROLOGIC A:   Sedated on vent Depression/anxiety P:   RASS goal: 0 Hold home Cymbalta  FAMILY  - Updates: No family bedside this AM.  The patient is critically ill with multiple organ systems failure and requires high  complexity decision making for assessment and support, frequent evaluation and titration of therapies, application of advanced monitoring technologies and extensive interpretation of multiple databases.   Critical Care Time devoted to patient care services described in this note is  35  Minutes. This time reflects time of care of this signee Dr Koren Bound. This critical care time does not reflect procedure time, or teaching time or supervisory time of PA/NP/Med student/Med Resident etc but could involve care discussion time.  Alyson Reedy, M.D. [facility] Pulmonary/Critical Care Medicine. Pager: [phone]. After hours pager: [phone].

## 2014-11-06 NOTE — Progress Notes (Signed)
Initial Nutrition Assessment  INTERVENTION:   Initiate Vital AF 1.2 @ 25 ml/hr via OG tube and increase by 10 ml every 4 hours to goal rate of 45 ml/hr.   30 ml Prostat daily.    Tube feeding regimen provides 1396 kcal (97% of needs), 96 grams of protein, and 875 ml of H2O.    NUTRITION DIAGNOSIS:   Inadequate oral intake related to inability to eat as evidenced by NPO status.   GOAL:   Patient will meet greater than or equal to 90% of their needs   MONITOR:   I & O's, Labs, Weight trends, TF tolerance  REASON FOR ASSESSMENT:   Consult Enteral/tube feeding initiation and management  ASSESSMENT:   81 yo male with hx of HTN, COPD, HF, presented to ED on 8/9 with dyspnea x 4 days, worsening SOB, increased sputum. Patient was admitted to SDU, placed on CPAP. On 8/10 he had markedly worse edema on CXR and dyspnea, PCCM was consulted. Patient was intubated and central line was placed.  Patient is currently intubated (8/10) on ventilator support MV: 6.6 L/min Temp (24hrs), Avg:97.5 F (36.4 C), Min:96 F (35.6 C), Max:98 F (36.7 C)   Labs reviewed: phosphorus elevated Medications reviewed and include: solumedrol, lasix OG tube, no xray Pt tried multiple times to write on a dry erase board but unable to decipher writing. Family at bedside and unable to decipher writing.  Unable to complete Nutrition-Focused physical exam at this time.  Pt frustrated. Concerned for malnutrition.   Diet Order:  Diet NPO time specified  Skin:  Reviewed, no issues  Last BM:  8/8  Height:   Ht Readings from Last 1 Encounters:  11/06/14 5\' 10"  (1.778 m)    Weight:   Wt Readings from Last 1 Encounters:  11/06/14 144 lb 13.5 oz (65.7 kg)    Ideal Body Weight:  75.4 kg  BMI:  Body mass index is 20.78 kg/(m^2).  Estimated Nutritional Needs:   Kcal:  1435  Protein:  95-110 grams  Fluid:  > 1.5 L/day  EDUCATION NEEDS:   No education needs identified at this  time  Kendell Bane RD, LDN, CNSC [phone] Pager [phone] After Hours Pager

## 2014-11-06 NOTE — Progress Notes (Signed)
Advanced Heart Failure Rounding Note  PCP: Luiz Iron Primary Cardiologist: Ladona Ridgel HF Cardiologist: Shirlee Latch  Subjective:    No further events overnight. Intubated. Awake on vent. HR much improved  Out 1 L. Down 2 lbs.  From admit weight.  Coox 79.5 CVP 4   Objective:   Weight Range: 144 lb 13.5 oz (65.7 kg) Body mass index is 20.78 kg/(m^2).   Vital Signs:   Temp:  [96 F (35.6 C)-98.1 F (36.7 C)] 97.5 F (36.4 C) (08/11 0400) Pulse Rate:  [41-141] 85 (08/11 0600) Resp:  [13-39] 33 (08/11 0600) BP: (101-182)/(37-70) 111/59 mmHg (08/11 0023) SpO2:  [90 %-100 %] 100 % (08/11 0600) Arterial Line BP: (114-181)/(39-90) 121/44 mmHg (08/11 0600) FiO2 (%):  [40 %-100 %] 50 % (08/11 0400) Weight:  [144 lb 13.5 oz (65.7 kg)] 144 lb 13.5 oz (65.7 kg) (08/11 0500) Last BM Date: 11/03/14  Weight change: Filed Weights   11/16/2014 2215 11/05/14 0429 11/06/14 0500  Weight: 149 lb 7.6 oz (67.8 kg) 149 lb 7.6 oz (67.8 kg) 144 lb 13.5 oz (65.7 kg)    Intake/Output:   Intake/Output Summary (Last 24 hours) at 11/06/14 0742 Last data filed at 11/06/14 0630  Gross per 24 hour  Intake  533.5 ml  Output   1630 ml  Net -1096.5 ml     Physical Exam: General: Elderly, Awake on . Vent. Neuro: Shakes heads to questioning. Psych: Difficult to assess on vent. HEENT: Normal Neck: Supple without bruits or JVD. Lungs: Vent support.  Crackles bibasilarly. Heart: RRR no s3, s4, or murmurs. Abdomen: Soft, non-tender, non-distended, BS + x 4.  Extremities: No clubbing, cyanosis or edema. DP/PT/Radials 2+ and equal bilaterally.   Telemetry: NSR 90s. PVCs including bigeminy and 3 beat runs of NSVT.  Labs: CBC  Recent Labs  11/16/2014 1530 11/05/14 0557 11/06/14 0345  WBC 8.2 5.6 18.1*  NEUTROABS 6.4  --   --   HGB 11.6* 10.3* 10.6*  HCT 35.8* 31.3* 32.9*  MCV 93.7 92.3 92.7  PLT 351 301 319   Basic Metabolic Panel  Recent Labs  11/05/14 0557 11/06/14 0345  NA 137 137   K 4.3 4.4  CL 95* 94*  CO2 30 30  GLUCOSE 154* 166*  BUN 38* 50*  CALCIUM 9.4 9.2  MG  --  2.1  PHOS  --  6.2*   Liver Function Tests  Recent Labs  10/30/2014 1530  AST 19  ALT 11*  ALKPHOS 80  BILITOT 1.1  PROT 7.2  ALBUMIN 3.8   No results for input(s): LIPASE, AMYLASE in the last 72 hours. Cardiac Enzymes  Recent Labs  11/05/14 1254 11/05/14 1810 11/06/14 0015  TROPONINI 0.05* 0.37* 0.53*    BNP: BNP (last 3 results)  Recent Labs  10/07/14 1300 10/29/14 1225 11/10/2014 1530  BNP 1758.5* 1266.9* 1596.1*    ProBNP (last 3 results)  Recent Labs  02/24/14 1606 02/26/14 1623 03/06/14 1129  PROBNP 2973.0* 18948.0* 5943.0*     D-Dimer No results for input(s): DDIMER in the last 72 hours. Hemoglobin A1C No results for input(s): HGBA1C in the last 72 hours. Fasting Lipid Panel No results for input(s): CHOL, HDL, LDLCALC, TRIG, CHOLHDL, LDLDIRECT in the last 72 hours. Thyroid Function Tests No results for input(s): TSH, T4TOTAL, T3FREE, THYROIDAB in the last 72 hours.  Invalid input(s): FREET3  Other results:     Imaging/Studies:  X-ray Chest Pa And Lateral  11/05/2014   CLINICAL DATA:  Acute onset of shortness of breath.  Initial encounter.  EXAM: CHEST  2 VIEW  COMPARISON:  Chest radiograph performed earlier today at 3:57 p.m.  FINDINGS: The lungs are well-aerated. Small bilateral pleural effusions are seen, left greater than right. Underlying vascular congestion is noted, with increased interstitial markings, concerning for mild pulmonary edema or possibly pneumonia, improved from the prior study. No pneumothorax is seen.  The heart is borderline normal in size. The patient is status post median sternotomy. A pacemaker is noted at the left chest wall, with leads ending at the right atrium and right ventricle. No acute osseous abnormalities are seen.  IMPRESSION: Interval improvement in underlying increased interstitial markings, which may reflect  pulmonary edema or pneumonia. Small bilateral pleural effusions, left greater than right. Underlying vascular congestion noted.   Electronically Signed   By: Roanna Raider M.D.   On: 11/05/2014 03:47   Dg Chest Port 1 View  11/06/2014   CLINICAL DATA:  Shortness of breath.  EXAM: PORTABLE CHEST - 1 VIEW  COMPARISON:  11/05/2014.  FINDINGS: Endotracheal tube, NG tube, right IJ line in stable position. Cardiac pacer in stable position. Aortic valve replacement. Cardiomegaly with persistent but improving bilateral pulmonary infiltrates small right pleural effusion. Left costophrenic angle not imaged. No pneumothorax.  IMPRESSION: 1. Lines and tubes in stable position. 2. Cardiac pacer in stable position. Prior aortic valve replacement. 3. Congestive heart failure with pulmonary edema and small right pleural effusion. Interim slight improvement from prior exam .   Electronically Signed   By: Maisie Fus  Register   On: 11/06/2014 07:25   Dg Chest Port 1 View  11/05/2014   CLINICAL DATA:  Endotracheal tube placement. Central line placement.  EXAM: PORTABLE CHEST - 1 VIEW  COMPARISON:  11/05/2014 and 11/16/2014.  FINDINGS: 1556 hours interval intubation. The endotracheal tube is in the midtrachea. There is a new right IJ central venous catheter. Its tip is not well visualized, overlapping the left subclavian pacemaker leads which extend into the right atrium and right ventricle. Nasogastric tube projects below the diaphragm, tip not visualized. Pulmonary edema has mildly improved. There is stable cardiomegaly status post median sternotomy and aortic valve replacement. There are small bilateral pleural effusions. No pneumothorax.  IMPRESSION: 1. Satisfactorily positioned endotracheal and nasogastric tubes. 2. Tip of the right IJ central venous catheter overlaps the pacemaker leads and is not well seen. 3. Improved pulmonary edema.   Electronically Signed   By: Carey Bullocks M.D.   On: 11/05/2014 16:06   Dg Chest Port  1 View  11/05/2014   CLINICAL DATA:  Hypoxia  EXAM: PORTABLE CHEST - 1 VIEW  COMPARISON:  11/02/2014  FINDINGS: Worsening bilateral airspace disease consistent with edema. Small right pleural effusion and small left pleural effusion.  Prior CABG.  Transvenous pacemaker unchanged.  IMPRESSION: Progression of diffuse bilateral airspace disease with small pleural effusions consistent with heart failure and edema.   Electronically Signed   By: Marlan Palau M.D.   On: 11/05/2014 12:32   Dg Chest Port 1 View  11/21/2014   CLINICAL DATA:  81 year old male with shortness of Breath. Current history of COPD, home oxygen requirement. Initial encounter.  EXAM: PORTABLE CHEST - 1 VIEW  COMPARISON:  10/08/2014 and earlier.  FINDINGS: Portable AP semi upright view at 1557 hours. Suspect widespread increased reticulonodular pulmonary opacity, more confluent in the right lung. This is an appearance similar to that in May of this year, but appeared to be cleared on the July comparison. Stable cardiac size and mediastinal contours.  No pleural effusion. No pneumothorax. Calcified hilar lymph nodes. Cardiac valve prosthesis and prior sternotomy. Left chest cardiac pacemaker  IMPRESSION: Chronic lung disease with suspected acute bilateral infectious exacerbation in the form of increased reticulonodular opacity in both lungs since July. No pleural effusion.   Electronically Signed   By: Odessa Fleming M.D.   On: 2014-11-14 16:25     Latest Echo  Latest Cath   Medications:     Scheduled Medications: . allopurinol  200 mg Oral Daily  . antiseptic oral rinse  7 mL Mouth Rinse QID  . atorvastatin  40 mg Oral QPM  . carvedilol  6.25 mg Oral BID WC  . chlorhexidine gluconate  15 mL Mouth Rinse BID  . clopidogrel  75 mg Oral Daily  . DULoxetine  60 mg Oral Daily  . methylPREDNISolone (SOLU-MEDROL) injection  60 mg Intravenous Q6H  . metolazone  5 mg Oral Once  . metolazone  5 mg Oral Daily  . pantoprazole  40 mg Oral Daily   . sodium chloride  3 mL Intravenous Q12H  . Warfarin - Pharmacist Dosing Inpatient   Does not apply q1800     Infusions: . furosemide (LASIX) infusion 5 mg/hr (11/06/14 0630)  . heparin 950 Units/hr (11/05/14 1900)     PRN Medications:  fentaNYL (SUBLIMAZE) injection, ipratropium-albuterol, midazolam   Assessment/Plan   1. Acute hypoxic respiratory failure 2. 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 3. PAF - pacer 4. COPD 5. CKD stage 3 6. H/o TIA 07/2013 7. Elevated troponin  Significant detioriation after seeing him yesterday.  Now intubated on ventilator support. Will hold lasix with CVP of 4 and Cr ^ today.  Will discuss optimal maintenance dose with MD.  Follow coox and CVPs.   Serial troponins elevated, likely in the setting of acute decompensation, but with up trend. Will repeat q6 x 3.  Resp status does not correlate well with volume status currently.  Pct and WBC increased from yesterday.  Follow electrolytes.  Goal of K >4.0 and Mg > 2.0  Length of Stay: 2   Graciella Freer PA-C 11/06/2014, 7:42 AM  Advanced Heart Failure Team Pager [phone] (M-F; 7a - 4p)  Please contact [facility] for night-coverage after hours (4p -7a ) and weekends on [email or web address]  Patient seen and examined with Otilio Saber, PA-C. We discussed all aspects of the encounter. I agree with the assessment and plan as stated above.   Respiratory distress far out of proportion to volume status and thus I don't think HF is major issues here. I am concerned about possible hypersensitivity pneumonitis. Sed rate 45 but was 134 in April. Will stop lasix. Hopefully can be extubated soon. Will discuss w/u of possible pneumonitis with Pulmonary. We interrogated device and no evidence of AF suspect tachycardia related to respiratory distress and not vice versa.   The patient is critically ill with multiple organ systems failure and requires high complexity decision making for assessment  and support, frequent evaluation and titration of therapies, application of advanced monitoring technologies and extensive interpretation of multiple databases.   Critical Care Time devoted to patient care services described in this note is 35 Minutes.  Bensimhon, Daniel,MD 5:34 PM

## 2014-11-07 ENCOUNTER — Other Ambulatory Visit: Payer: Self-pay

## 2014-11-07 ENCOUNTER — Other Ambulatory Visit (HOSPITAL_COMMUNITY): Payer: Medicare HMO

## 2014-11-07 ENCOUNTER — Inpatient Hospital Stay (HOSPITAL_COMMUNITY): Payer: Medicare HMO

## 2014-11-07 LAB — BLOOD GAS, ARTERIAL
Acid-Base Excess: 7 mmol/L — ABNORMAL HIGH (ref 0.0–2.0)
BICARBONATE: 31.5 meq/L — AB (ref 20.0–24.0)
Drawn by: 40415
FIO2: 0.4
MECHVT: 500 mL
O2 SAT: 97.9 %
PEEP/CPAP: 8 cmH2O
PO2 ART: 116 mmHg — AB (ref 80.0–100.0)
Patient temperature: 98.6
RATE: 12 resp/min
TCO2: 33 mmol/L (ref 0–100)
pCO2 arterial: 49.2 mmHg — ABNORMAL HIGH (ref 35.0–45.0)
pH, Arterial: 7.422 (ref 7.350–7.450)

## 2014-11-07 LAB — URINALYSIS, ROUTINE W REFLEX MICROSCOPIC
Bilirubin Urine: NEGATIVE
GLUCOSE, UA: NEGATIVE mg/dL
Ketones, ur: NEGATIVE mg/dL
Nitrite: NEGATIVE
PH: 5 (ref 5.0–8.0)
Protein, ur: NEGATIVE mg/dL
Specific Gravity, Urine: 1.014 (ref 1.005–1.030)
Urobilinogen, UA: 0.2 mg/dL (ref 0.0–1.0)

## 2014-11-07 LAB — POCT I-STAT 3, ART BLOOD GAS (G3+)
ACID-BASE EXCESS: 2 mmol/L (ref 0.0–2.0)
Bicarbonate: 30.3 mEq/L — ABNORMAL HIGH (ref 20.0–24.0)
O2 Saturation: 98 %
TCO2: 32 mmol/L (ref 0–100)
pCO2 arterial: 60.2 mmHg (ref 35.0–45.0)
pH, Arterial: 7.309 — ABNORMAL LOW (ref 7.350–7.450)
pO2, Arterial: 124 mmHg — ABNORMAL HIGH (ref 80.0–100.0)

## 2014-11-07 LAB — BASIC METABOLIC PANEL
Anion gap: 12 (ref 5–15)
BUN: 70 mg/dL — ABNORMAL HIGH (ref 6–20)
CO2: 32 mmol/L (ref 22–32)
Calcium: 9 mg/dL (ref 8.9–10.3)
Chloride: 94 mmol/L — ABNORMAL LOW (ref 101–111)
Creatinine, Ser: 2.22 mg/dL — ABNORMAL HIGH (ref 0.61–1.24)
GFR calc non Af Amer: 26 mL/min — ABNORMAL LOW (ref >60)
GFR, EST AFRICAN AMERICAN: 30 mL/min — AB (ref >60)
Glucose, Bld: 137 mg/dL — ABNORMAL HIGH (ref 65–99)
POTASSIUM: 3.9 mmol/L (ref 3.5–5.1)
SODIUM: 138 mmol/L (ref 135–145)

## 2014-11-07 LAB — CBC
HEMATOCRIT: 30.7 % — AB (ref 39.0–52.0)
HEMOGLOBIN: 9.9 g/dL — AB (ref 13.0–17.0)
MCH: 29.6 pg (ref 26.0–34.0)
MCHC: 32.2 g/dL (ref 30.0–36.0)
MCV: 91.6 fL (ref 78.0–100.0)
Platelets: 275 10*3/uL (ref 150–400)
RBC: 3.35 MIL/uL — AB (ref 4.22–5.81)
RDW: 16.5 % — ABNORMAL HIGH (ref 11.5–15.5)
WBC: 9.9 10*3/uL (ref 4.0–10.5)

## 2014-11-07 LAB — CARBOXYHEMOGLOBIN
CARBOXYHEMOGLOBIN: 1 % (ref 0.5–1.5)
METHEMOGLOBIN: 1.2 % (ref 0.0–1.5)
O2 SAT: 70.4 %
TOTAL HEMOGLOBIN: 10.6 g/dL — AB (ref 13.5–18.0)

## 2014-11-07 LAB — PROTIME-INR
INR: 2.83 — ABNORMAL HIGH (ref 0.00–1.49)
Prothrombin Time: 29.3 seconds — ABNORMAL HIGH (ref 11.6–15.2)

## 2014-11-07 LAB — URINE MICROSCOPIC-ADD ON

## 2014-11-07 LAB — GLUCOSE, CAPILLARY: GLUCOSE-CAPILLARY: 159 mg/dL — AB (ref 65–99)

## 2014-11-07 LAB — PHOSPHORUS: Phosphorus: 6.8 mg/dL — ABNORMAL HIGH (ref 2.5–4.6)

## 2014-11-07 LAB — MAGNESIUM: Magnesium: 2.4 mg/dL (ref 1.7–2.4)

## 2014-11-07 MED ORDER — FUROSEMIDE 10 MG/ML IJ SOLN
40.0000 mg | Freq: Once | INTRAMUSCULAR | Status: AC
  Administered 2014-11-07: 40 mg via INTRAVENOUS

## 2014-11-07 MED ORDER — DULOXETINE HCL 20 MG PO CPEP
20.0000 mg | ORAL_CAPSULE | Freq: Two times a day (BID) | ORAL | Status: DC
  Administered 2014-11-07 (×2): 20 mg via ORAL
  Filled 2014-11-07 (×2): qty 1

## 2014-11-07 MED ORDER — METHYLPREDNISOLONE SODIUM SUCC 40 MG IJ SOLR
30.0000 mg | Freq: Three times a day (TID) | INTRAMUSCULAR | Status: DC
  Administered 2014-11-07 (×2): 30 mg via INTRAVENOUS
  Filled 2014-11-07: qty 1

## 2014-11-07 MED ORDER — CETYLPYRIDINIUM CHLORIDE 0.05 % MT LIQD
7.0000 mL | Freq: Two times a day (BID) | OROMUCOSAL | Status: DC
  Administered 2014-11-07: 7 mL via OROMUCOSAL

## 2014-11-07 MED ORDER — NITROGLYCERIN 0.4 MG SL SUBL: 0.4000 mg | SUBLINGUAL_TABLET | SUBLINGUAL | Status: DC | PRN

## 2014-11-07 MED ORDER — PRO-STAT SUGAR FREE PO LIQD
30.0000 mL | Freq: Every day | ORAL | Status: DC
  Filled 2014-11-07: qty 30

## 2014-11-07 MED ORDER — FUROSEMIDE 10 MG/ML IJ SOLN: 40.0000 mg | Freq: Two times a day (BID) | INTRAMUSCULAR | Status: DC

## 2014-11-07 MED ORDER — PANTOPRAZOLE SODIUM 40 MG PO PACK
40.0000 mg | PACK | Freq: Every day | ORAL | Status: DC
  Administered 2014-11-07: 40 mg
  Filled 2014-11-07: qty 20

## 2014-11-07 MED ORDER — VITAL AF 1.2 CAL PO LIQD
1000.0000 mL | ORAL | Status: DC
Start: 2014-11-07 — End: 2014-11-07
  Filled 2014-11-07 (×2): qty 1000

## 2014-11-07 MED ORDER — MORPHINE SULFATE 2 MG/ML IJ SOLN
2.0000 mg | INTRAMUSCULAR | Status: DC | PRN
  Administered 2014-11-07: 2 mg via INTRAVENOUS

## 2014-11-07 MED ORDER — WARFARIN - PHARMACIST DOSING INPATIENT
Freq: Every day | Status: DC
  Administered 2014-11-07: 18:00:00

## 2014-11-07 MED ORDER — MORPHINE SULFATE 2 MG/ML IJ SOLN
INTRAMUSCULAR | Status: AC
  Administered 2014-11-07: 2 mg via INTRAVENOUS
  Filled 2014-11-07: qty 1

## 2014-11-07 MED ORDER — WARFARIN 0.5 MG HALF TABLET
0.5000 mg | ORAL_TABLET | Freq: Once | ORAL | Status: AC
  Administered 2014-11-07: 0.5 mg via ORAL
  Filled 2014-11-07: qty 1

## 2014-11-07 MED ORDER — POTASSIUM CHLORIDE 20 MEQ/15ML (10%) PO SOLN
20.0000 meq | Freq: Once | ORAL | Status: AC
  Administered 2014-11-07: 20 meq
  Filled 2014-11-07: qty 15

## 2014-11-07 MED ORDER — FUROSEMIDE 10 MG/ML IJ SOLN
INTRAMUSCULAR | Status: AC
  Administered 2014-11-07: 40 mg via INTRAVENOUS
  Filled 2014-11-07: qty 4

## 2014-11-08 LAB — TROPONIN I: TROPONIN I: 0.19 ng/mL — AB (ref <0.031)

## 2014-11-09 LAB — CULTURE, BLOOD (ROUTINE X 2)
Culture: NO GROWTH
Culture: NO GROWTH

## 2014-11-10 ENCOUNTER — Ambulatory Visit (HOSPITAL_COMMUNITY): Payer: Medicare HMO

## 2014-11-12 ENCOUNTER — Ambulatory Visit (HOSPITAL_COMMUNITY): Payer: Medicare HMO

## 2014-11-14 ENCOUNTER — Ambulatory Visit (HOSPITAL_COMMUNITY): Payer: Medicare HMO

## 2014-11-17 ENCOUNTER — Ambulatory Visit (HOSPITAL_COMMUNITY): Payer: Medicare HMO

## 2014-11-18 ENCOUNTER — Ambulatory Visit: Payer: Self-pay | Admitting: Neurology

## 2014-11-19 ENCOUNTER — Ambulatory Visit (HOSPITAL_COMMUNITY): Payer: Medicare HMO

## 2014-11-20 ENCOUNTER — Encounter: Payer: Self-pay | Admitting: Cardiology

## 2014-11-21 ENCOUNTER — Ambulatory Visit (HOSPITAL_COMMUNITY): Payer: Medicare HMO

## 2014-11-24 ENCOUNTER — Ambulatory Visit (HOSPITAL_COMMUNITY): Payer: Medicare HMO

## 2014-11-26 ENCOUNTER — Ambulatory Visit (HOSPITAL_COMMUNITY): Payer: Medicare HMO

## 2014-11-27 NOTE — Discharge Summary (Signed)
Timothy Durham, Timothy Durham NO.:  [id number]  MEDICAL RECORD NO.:  [id number]  LOCATION:  2H18C                        FACILITY:  [facility]  PHYSICIAN:  Providence Lanius, MD  DATE OF BIRTH:  [birth year]  DATE OF ADMISSION:  11/02/2014 DATE OF DISCHARGE:  11-23-2014                              DISCHARGE SUMMARY   DEATH SUMMARY  PRIMARY DIAGNOSIS/CAUSE OF DEATH:  Acute respiratory failure due to pulmonary edema.  SECONDARY DIAGNOSES:  Congestive heart failure, acute hypoxic respiratory failure, nonischemic cardiomyopathy, atrial fibrillation, acute on chronic kidney failure, healthcare-associated pneumonia, depression, anxiety and COPD exacerbation.  HOSPITAL COURSE:  The patient is an 81 year old male, whose past medical history significant for severe nonischemic cardiomyopathy, presents to the hospital with acute shortness of breath, was found to be in acute pulmonary edema.  The patient developed respiratory failure, was admitted to the intensive care unit and intubated.  The patient was diuresed; however, kidneys continued to deteriorate on August 12.  The patient manifested evidence of some improvement.  We had discussion with the family who informed us the patient would not want tracheostomy and a PEG tube.  At that point, the patient was optimized as best as possible and was extubated in the morning of August 12.  However, later that night the patient developed worsening shortness of breath, worsening respiratory failure, PCCM MD on duty met with family who relayed some of the information that the patient would not want to be reintubated for trach and PEG.  The patient made DNR comfort care to expire shortly thereafter with the family beside.     Providence Lanius, MD     WJY/MEDQ  D:  11/18/2014  T:  11/19/2014  Job:  [id number]

## 2014-11-27 NOTE — Progress Notes (Signed)
Pt down to CT as ordered by HF team. NAD, VSS. Family aware.

## 2014-11-27 NOTE — Progress Notes (Signed)
2200Family called to make aware of pt situation. Family arrived at bedside around 2245. Family remains at bedside 2325 pt refusing Bipap, wanted it to be taken off. Bipap taken off at pt request. Pt also refusing NRB and [place]. Pt noded his head yes that he was in pain. Pain medicine given at 2330 per MD order. Dr. Vassie Loll made aware of pt refusing Bipap.  Pt pronounced by [place] Perkins RN and Dorna Leitz RN at [phone]. Dr Zannie Cove on call for Cardiology made aware of pt passing and family requesting to speak to MD. Dr Sullivan Lone came to bedside and spoke to family and answered any and all questions.

## 2014-11-27 NOTE — Progress Notes (Signed)
2115 pt c/o SOB sats 90% on 3L [place], Pt sounds slightly wet at lung bases. Pt states he does not want "the tube". Oxygen increased to 5L sats maintaining 92%. 2130 HR increasing 120-130's. Pt becoming more SOB lungs sounds like fluid, Cardiology and PCCM called. Cardiology came to bedside to see pt.  New orders received from both services. Will continue to monitor closely.

## 2014-11-27 NOTE — Progress Notes (Signed)
eLink Physician-Brief Progress Note Patient Name: Timothy Durham DOB: 1930-11-14 MRN: [id number]   Date of Service  11/19/2014  HPI/Events of Note  Acute respiratory distress. Patient is DNR/DNI, however, will accept BiPAP.   eICU Interventions  Will order: 1. BIPAP and ABG in 1 hour.  2. Lasix 40 mg IV now.  3. Nitroglycerin 0.4 mg SL now. 4. Cycle Troponins 5. Portable CXR now.  Cardiology now at bedside. Defer further management to them.      Intervention Category Intermediate Interventions: Respiratory distress - evaluation and management  Kayd Launer Eugene 11/08/2014, 10:03 PM

## 2014-11-27 NOTE — Progress Notes (Signed)
Order obtained for foley pt is respiratory distress. Foley huddle done, sterile technique maintained. Put in by 2 RNs Marchelle Folks [facility] RN and [place] Perkins RN.

## 2014-11-27 NOTE — Progress Notes (Signed)
ANTICOAGULATION CONSULT NOTE - Follow Up Consult  Pharmacy Consult for Coumadin Indication: atrial fibrillation  Allergies  Allergen Reactions  . Ambien [Zolpidem] Other (See Comments)    Hallucinations  . Ativan [Lorazepam] Other (See Comments)    hallucinations  . Oxycontin [Oxycodone] Other (See Comments)    hallucinations  . Amiodarone     Possible pulmonary toxicity  . Penicillins     Unknown childhood reaction   . Spironolactone     Gynecomastia, tolerates Eplerenone  . Sulfa Antibiotics     unknown    Patient Measurements: Height: 5\' 10"  (177.8 cm) Weight: 138 lb 3.7 oz (62.7 kg) IBW/kg (Calculated) : 73  Vital Signs: Temp: 97.4 F (36.3 C) (08/12 0800) Temp Source: Axillary (08/12 0800) BP: 119/61 mmHg (08/12 1100) Pulse Rate: 98 (08/12 1100)  Labs:  Recent Labs  11/05/14 0557 11/05/14 1100  11/06/14 0345 11/06/14 1030 11/06/14 1515 11/06/14 2115 11/12/2014 0430  HGB 10.3*  --   --  10.6*  --   --   --  9.9*  HCT 31.3*  --   --  32.9*  --   --   --  30.7*  PLT 301  --   --  319  --   --   --  275  LABPROT  --  19.8*  --  24.4*  --   --   --  29.3*  INR  --  1.68*  --  2.22*  --   --   --  2.83*  HEPARINUNFRC  --   --   --  0.32  --   --   --   --   CREATININE 1.81*  --   --  2.15*  --   --   --  2.22*  TROPONINI 0.03  --   < >  --  0.40* 0.36* 0.28*  --   < > = values in this interval not displayed.  Estimated Creatinine Clearance: 22.8 mL/min (by C-G formula based on Cr of 2.22).  Assessment: 84yom on coumadin pta for afib, admitted with subtherapeutic INR and heparin bridge added. Heparin bridge discontinued yesterday as INR was within goal. He was subsequently intubated and decision made to hold coumadin altogether pending any additional procedures (last coumadin dose given 8/10). INR has since trended up quickly despite no dose given 8/11 (1.6>2.2>2.83). He has now been extubated and decision made to resume coumadin. Will give low dose  tonight.  Goal of Therapy:  INR 2-3 Monitor platelets by anticoagulation protocol: Yes   Plan:  1) Coumadin 0.5mg  x 1 2) Daily INR  Fredrik Rigger 11/22/2014,12:16 PM

## 2014-11-27 NOTE — Progress Notes (Addendum)
Advanced Heart Failure Rounding Note  PCP: Luiz Iron Primary Cardiologist: Ladona Ridgel HF Cardiologist: Shirlee Latch  Subjective:    No further events overnight.  Intubated. Awake on vent. HR improved, in 70s currently.  Writes down that he is cold and has a sore throat. Denies CP or SOB, but is on vent. Out 2.2 L. Down 6 lbs.    Coox 70.4 CVP 7   Objective:   Weight Range: 138 lb 3.7 oz (62.7 kg) Body mass index is 19.83 kg/(m^2).   Vital Signs:   Temp:  [96 F (35.6 C)-98 F (36.7 C)] 96 F (35.6 C) (08/12 0400) Pulse Rate:  [41-120] 72 (08/12 0700) Resp:  [11-32] 12 (08/12 0700) BP: (111-138)/(40-49) 138/49 mmHg (08/12 0431) SpO2:  [98 %-100 %] 100 % (08/12 0700) Arterial Line BP: (104-148)/(31-56) 124/43 mmHg (08/12 0700) FiO2 (%):  [40 %] 40 % (08/12 0400) Weight:  [138 lb 3.7 oz (62.7 kg)] 138 lb 3.7 oz (62.7 kg) (08/12 0200) Last BM Date:  (UTA, none this admission)  Weight change: Filed Weights   11/05/14 0429 11/06/14 0500 November 23, 2014 0200  Weight: 149 lb 7.6 oz (67.8 kg) 144 lb 13.5 oz (65.7 kg) 138 lb 3.7 oz (62.7 kg)    Intake/Output:   Intake/Output Summary (Last 24 hours) at Nov 23, 2014 0748 Last data filed at 11-23-14 0600  Gross per 24 hour  Intake   99.5 ml  Output   2252 ml  Net -2152.5 ml     Physical Exam: General: Elderly, Awake on  vent. Neuro: Shakes heads to questioning, writes with pen and paper. Psych: Normal.  Responds to questioning. HEENT: Normal Neck: Supple without bruits or JVD. Lungs: Vent support.  Mild crackles bibasilarly. Heart: RRR no s3, s4, or murmurs. Abdomen: Soft, non-tender, non-distended, BS + x 4.  Extremities: No clubbing, cyanosis or edema. DP/PT/Radials 2+ and equal bilaterally.SCDs in place.   Telemetry: V paced 70s. Occasional PVCs  Labs: CBC  Recent Labs  10/31/2014 1530  11/06/14 0345 11/23/2014 0430  WBC 8.2  < > 18.1* 9.9  NEUTROABS 6.4  --   --   --   HGB 11.6*  < > 10.6* 9.9*  HCT 35.8*  < >  32.9* 30.7*  MCV 93.7  < > 92.7 91.6  PLT 351  < > 319 275  < > = values in this interval not displayed. Basic Metabolic Panel  Recent Labs  11/06/14 0345 2014-11-23 0430  NA 137 138  K 4.4 3.9  CL 94* 94*  CO2 30 32  GLUCOSE 166* 137*  BUN 50* 70*  CALCIUM 9.2 9.0  MG 2.1 2.4  PHOS 6.2* 6.8*   Liver Function Tests  Recent Labs  11/03/2014 1530  AST 19  ALT 11*  ALKPHOS 80  BILITOT 1.1  PROT 7.2  ALBUMIN 3.8   No results for input(s): LIPASE, AMYLASE in the last 72 hours. Cardiac Enzymes  Recent Labs  11/06/14 1030 11/06/14 1515 11/06/14 2115  TROPONINI 0.40* 0.36* 0.28*    BNP: BNP (last 3 results)  Recent Labs  10/07/14 1300 10/29/14 1225 11/10/2014 1530  BNP 1758.5* 1266.9* 1596.1*    ProBNP (last 3 results)  Recent Labs  02/24/14 1606 02/26/14 1623 03/06/14 1129  PROBNP 2973.0* 18948.0* 5943.0*     D-Dimer No results for input(s): DDIMER in the last 72 hours. Hemoglobin A1C No results for input(s): HGBA1C in the last 72 hours. Fasting Lipid Panel No results for input(s): CHOL, HDL, LDLCALC, TRIG, CHOLHDL, LDLDIRECT in the last  72 hours. Thyroid Function Tests No results for input(s): TSH, T4TOTAL, T3FREE, THYROIDAB in the last 72 hours.  Invalid input(s): FREET3  Other results:     Imaging/Studies:  Dg Chest Port 1 View  11/19/2014   CLINICAL DATA:  Intubation.  EXAM: PORTABLE CHEST - 1 VIEW  COMPARISON:  11/06/2014.  FINDINGS: Endotracheal tube is just above the right mainstem bronchus, proximal repositioning of 2 cm suggested. NG tube in stable position. Right IJ line stable position. Cardiac pacer stable position. Prior aortic valve replacement. Cardiomegaly with diffuse bilateral pulmonary infiltrates and small pleural effusions noted consistent with congestive heart failure. Similar findings noted on prior study.  IMPRESSION: 1. Endotracheal tube tip noted just above the right mainstem bronchus. 2. Cardiac pacer stable position.  Prior aortic valve replacement. Persistent changes of congestive heart failure with bilateral pulmonary edema and small pleural effusions. No significant interim change.   Electronically Signed   By: Maisie Fus  Register   On: 11/25/2014 07:37   Dg Chest Port 1 View  11/06/2014   CLINICAL DATA:  Shortness of breath.  EXAM: PORTABLE CHEST - 1 VIEW  COMPARISON:  11/05/2014.  FINDINGS: Endotracheal tube, NG tube, right IJ line in stable position. Cardiac pacer in stable position. Aortic valve replacement. Cardiomegaly with persistent but improving bilateral pulmonary infiltrates small right pleural effusion. Left costophrenic angle not imaged. No pneumothorax.  IMPRESSION: 1. Lines and tubes in stable position. 2. Cardiac pacer in stable position. Prior aortic valve replacement. 3. Congestive heart failure with pulmonary edema and small right pleural effusion. Interim slight improvement from prior exam .   Electronically Signed   By: Maisie Fus  Register   On: 11/06/2014 07:25   Dg Chest Port 1 View  11/05/2014   CLINICAL DATA:  Endotracheal tube placement. Central line placement.  EXAM: PORTABLE CHEST - 1 VIEW  COMPARISON:  11/05/2014 and 11/05/2014.  FINDINGS: 1556 hours interval intubation. The endotracheal tube is in the midtrachea. There is a new right IJ central venous catheter. Its tip is not well visualized, overlapping the left subclavian pacemaker leads which extend into the right atrium and right ventricle. Nasogastric tube projects below the diaphragm, tip not visualized. Pulmonary edema has mildly improved. There is stable cardiomegaly status post median sternotomy and aortic valve replacement. There are small bilateral pleural effusions. No pneumothorax.  IMPRESSION: 1. Satisfactorily positioned endotracheal and nasogastric tubes. 2. Tip of the right IJ central venous catheter overlaps the pacemaker leads and is not well seen. 3. Improved pulmonary edema.   Electronically Signed   By: Carey Bullocks M.D.    On: 11/05/2014 16:06   Dg Chest Port 1 View  11/05/2014   CLINICAL DATA:  Hypoxia  EXAM: PORTABLE CHEST - 1 VIEW  COMPARISON:  10/29/2014  FINDINGS: Worsening bilateral airspace disease consistent with edema. Small right pleural effusion and small left pleural effusion.  Prior CABG.  Transvenous pacemaker unchanged.  IMPRESSION: Progression of diffuse bilateral airspace disease with small pleural effusions consistent with heart failure and edema.   Electronically Signed   By: Marlan Palau M.D.   On: 11/05/2014 12:32    Latest Echo  Latest Cath   Medications:     Scheduled Medications: . allopurinol  200 mg Oral Daily  . antiseptic oral rinse  7 mL Mouth Rinse QID  . atorvastatin  40 mg Oral QPM  . carvedilol  6.25 mg Oral BID WC  . chlorhexidine gluconate  15 mL Mouth Rinse BID  . clopidogrel  75 mg Oral  Daily  . feeding supplement (PRO-STAT SUGAR FREE 64)  30 mL Per Tube Daily  . methylPREDNISolone (SOLU-MEDROL) injection  60 mg Intravenous Q6H  . metolazone  5 mg Oral Once  . pantoprazole (PROTONIX) IV  40 mg Intravenous Q24H  . sodium chloride  3 mL Intravenous Q12H    Infusions: . feeding supplement (VITAL AF 1.2 CAL)      PRN Medications: fentaNYL (SUBLIMAZE) injection, ipratropium-albuterol, midazolam   Assessment/Plan   1. Acute hypoxic respiratory failure 2. 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 3. PAF - pacer 4. COPD 5. CKD stage 3 6. H/o TIA 07/2013 7. Elevated troponin 8. DNR  CVP improved today, Consider holding one more day vs placing back on po dose.  Cr up but relatively stable from yesterday.   Follow coox and CVPs.  Serial troponins trended down.  Resp status does not correlate well with volume status and our current thought is HF is not the major issue.   Concern for possible hypersensitivity pneumonitis  Interrogated device yesterday and no evidence of AF. Suspect tachycardia related to respiratory distress. WBC decreased likely  2/2 steroids.  Follow electrolytes.  Goal of K >4.0 and Mg > 2.0. Will supp K as needed.  Length of Stay: 3   Graciella Freer PA-C 2014-12-07, 7:48 AM  Advanced Heart Failure Team Pager [phone] (M-F; 7a - 4p)  Please contact [facility] for night-coverage after hours (4p -7a ) and weekends on [email or web address]  Patient seen and examined with Otilio Saber, PA-C. We discussed all aspects of the encounter. I agree with the assessment and plan as stated above.   Now extubated. Adamant that he does not want to be reintubated. Respiratory distress far out of proportion to volume status and thus I don't think HF is biggest issue here. I am concerned about possible hypersensitivity pneumonitis but pulmonary does not think he is candidate for bronch/bx.. Sed rate 45 but was 134 in April. Can we try longer course of steroids?  We interrogated device and no evidence of AF suspect tachycardia related to respiratory distress and not vice versa. Hold diuretics today. Restart tomorrow.   I will order hi-res chest CT looking for ILD.   The patient is critically ill with multiple organ systems failure and requires high complexity decision making for assessment and support, frequent evaluation and titration of therapies, application of advanced monitoring technologies and extensive interpretation of multiple databases.   Critical Care Time devoted to patient care services described in this note is 35 Minutes.  Kwali Wrinkle,MD 12:01 PM

## 2014-11-27 NOTE — Progress Notes (Signed)
PULMONARY / CRITICAL CARE MEDICINE   Name: Timothy Durham MRN: [id number] DOB: January 07, 1931    ADMISSION DATE:  11-23-2014 CONSULTATION DATE:  11/05/2014  REFERRING MD :  Josem Kaufmann  CHIEF COMPLAINT:  Dyspnea  INITIAL PRESENTATION: 81 yo male with hx of HTN, COPD, HF, presented to ED on 8/9 with dyspnea x 4 days, worsening SOB, increased sputum. Patient was admitted to SDU, placed on CPAP. On 8/10 he had markedly worse edema on CXR and dyspnea, PCCM was consulted. Patient was intubated and central line was placed.  STUDIES:  5/7 Echo: LVEF 25-30%, grade 2 DD, prosthetic AV, PAP 33  SIGNIFICANT EVENTS: 8/10 Dyspnea worsened, patient intubated, CVL placed 8/12 SBT started  LINES/DRAINS: R IJ central line 8/10 >> R radial A line 8/10 >>  BRIEF PATIENT DESCRIPTOR:  Timothy Durham is a 81 y.o. male w/ PMHx of HTN, HLD, COPD, PAF on Coumadin, h/o AVR (bioprosthetic), chronic combined CHF, PVD, and GERD, presents to the [facility] ED w/ complaints of worsening SOB. Patient states he has had increasing dyspnea for the past 3-4 days which became so severe he felt he needed to come to the ED. He was out walking with his wife, went to the farmer's market, and [employer] and states he simply could not catch his breath. The patient does note he has baseline dyspnea but states today it was simply unbearable. He also notes worsening cough somewhat productive of a clear and sometimes yellowish sputum. Patient denies fever, chills, nausea, vomiting, chest pain, PND, or orthopnea. He does admit that the SOB is worse w/ exertion but also states that he had it at rest today which was improved w/ albuterol inhaler. He denies sick contacts, non-compliance w/ diuretics at home, or weight gain since his last admission in 09/2014.   On admission patient was diagnosed with acute hypoxic respiratory failure placed on BiPAP, treated for COPD exacerbation, HF exacerbation, and HCAP. On 8/10 patient had sudden onset of increased  dyspnea and WOB and PCCM was consulted. Patient was intubated and admitted to the CCU.   SUBJECTIVE: Patient is awake and alert, complaining of pain in his throat. Communicating with hands that he is hungry and that he wants to get tube out today. No acute events reported overnight.  VITAL SIGNS: Temp:  [96 F (35.6 C)-98 F (36.7 C)] 96 F (35.6 C) (08/12 0400) Pulse Rate:  [71-120] 72 (08/12 0700) Resp:  [12-29] 12 (08/12 0700) BP: (131-138)/(47-49) 138/49 mmHg (08/12 0431) SpO2:  [98 %-100 %] 100 % (08/12 0700) Arterial Line BP: (104-148)/(34-56) 124/43 mmHg (08/12 0700) FiO2 (%):  [40 %] 40 % (08/12 0400) Weight:  [138 lb 3.7 oz (62.7 kg)] 138 lb 3.7 oz (62.7 kg) (08/12 0200) HEMODYNAMICS: CVP:  [6 mmHg] 6 mmHg VENTILATOR SETTINGS: Vent Mode:  [-] PRVC FiO2 (%):  [40 %] 40 % Set Rate:  [12 bmp] 12 bmp Vt Set:  [500 mL] 500 mL PEEP:  [8 cmH20] 8 cmH20 Plateau Pressure:  [17 cmH20-23 cmH20] 17 cmH20 INTAKE / OUTPUT:  Intake/Output Summary (Last 24 hours) at 10/28/2014 4098 Last data filed at 11/14/2014 0600  Gross per 24 hour  Intake     85 ml  Output   2087 ml  Net  -2002 ml    PHYSICAL EXAMINATION: General:  White male, alert and attempting to communicate, laying in bed comfortably Neuro: Alert and oriented, communicates he wants to get tube out. Following commands HEENT:  NCAT, PERRL, EOM-spontaneous and MMM. Cardiovascular:  2/6 systolic murmur, afib, rate controlled Lungs:  CTABL Abdomen:  Soft, NT, ND, hypoactive BS Musculoskeletal:  Normal ROM  Skin:  Warm, in tact  LABS:  CBC  Recent Labs Lab 11/05/14 0557 11/06/14 0345 11/22/2014 0430  WBC 5.6 18.1* 9.9  HGB 10.3* 10.6* 9.9*  HCT 31.3* 32.9* 30.7*  PLT 301 319 275   Coag's  Recent Labs Lab 11/05/14 1100 11/06/14 0345 11/22/2014 0430  INR 1.68* 2.22* 2.83*   BMET  Recent Labs Lab 11/05/14 0557 11/06/14 0345 11/17/2014 0430  NA 137 137 138  K 4.3 4.4 3.9  CL 95* 94* 94*  CO2 30 30 32  BUN  38* 50* 70*  CREATININE 1.81* 2.15* 2.22*  GLUCOSE 154* 166* 137*   Electrolytes  Recent Labs Lab 11/05/14 0557 11/06/14 0345 11/02/2014 0430  CALCIUM 9.4 9.2 9.0  MG  --  2.1 2.4  PHOS  --  6.2* 6.8*   Sepsis Markers  Recent Labs Lab Dec 02, 2014 1557 11/05/14 0005 11/06/14 0345  LATICACIDVEN 1.57  --   --   PROCALCITON  --  0.17 0.37   ABG  Recent Labs Lab 11/05/14 1613 11/06/14 0430 11/16/2014 0542  PHART 7.361 7.447 7.422  PCO2ART 56.2* 40.7 49.2*  PO2ART 275.0* 180* 116*   Liver Enzymes  Recent Labs Lab 12-02-14 1530  AST 19  ALT 11*  ALKPHOS 80  BILITOT 1.1  ALBUMIN 3.8   Cardiac Enzymes  Recent Labs Lab 11/06/14 1030 11/06/14 1515 11/06/14 2115  TROPONINI 0.40* 0.36* 0.28*   Glucose  Recent Labs Lab 11/05/14 1638  GLUCAP 168*    Imaging Dg Chest Port 1 View  11/26/2014   CLINICAL DATA:  Intubation.  EXAM: PORTABLE CHEST - 1 VIEW  COMPARISON:  11/06/2014.  FINDINGS: Endotracheal tube is just above the right mainstem bronchus, proximal repositioning of 2 cm suggested. NG tube in stable position. Right IJ line stable position. Cardiac pacer stable position. Prior aortic valve replacement. Cardiomegaly with diffuse bilateral pulmonary infiltrates and small pleural effusions noted consistent with congestive heart failure. Similar findings noted on prior study.  IMPRESSION: 1. Endotracheal tube tip noted just above the right mainstem bronchus. 2. Cardiac pacer stable position. Prior aortic valve replacement. Persistent changes of congestive heart failure with bilateral pulmonary edema and small pleural effusions. No significant interim change.   Electronically Signed   By: Maisie Fus  Register   On: 11/21/2014 07:37     ASSESSMENT / PLAN:  PULMONARY OETT 8/10 >> A: Acute hypoxic respiratory failure Acute pulmonary edema secondary to HF vs. filling dysfunction due to afib with RVR Hx COPD Concern for HCAP - unlikely P:   SBT to extubate  today. Titrate O2 for sat of 88-92%. D/C lasix - down 3L since admission. Decrease solumedrol to 30 mg IV q8 and taper down over the next 3 days to off, highly doubt that this is a pneumonitis Duonebs q4 D/C abx - see ID  CARDIOVASCULAR CVL R IJ 8/10 >> R radial A line 8/10 >> A:  NICM >> chronic combined CHF, LVEF 25-30%, grade 2 DD, prosthetic AV Afib with RVR - well controlled Hx HTN Permanent pacemaker HF exacerbation, BNP 1596 P:  On home Coreg BID, afib rate controlled Hold Coumadin- INR is 2.83 Continue statin, plavix Cards following  RENAL A:   Acute on chronic kidney failure, Cr 2.22, baseline 1.8 P:   Was negative balance with Lasix drip for pulmonary edema, down 3L since baseline Keep even balance today due to creatinine  increasing Hold IVF Replace electrolytes as needed  GASTROINTESTINAL OGT 8/10 >> A:   GERD  P:   PPI TF per nutrition  HEMATOLOGIC A:   Anemia of chronic disease, Hgb 9.9 Hx gout P:  Follow CBC On Plavix Holding Coumadin, holding heparin On home allopurinol  INFECTIOUS A:   Possible HCAP Normal WBC, midlly elevated PCT, normal lactate, afebrile  P:   BCx2 >>>  Abx:  Ceftazidime, start date 8/9 >> 8/10  Likely not infectious process, treating for pulmonary edema secondary to HF and afib with RVR causing diastolic dysfunction at this time. If clinically worsens, consider adding PNA coverage with PCT of 0.37, d/c abx.  ENDOCRINE A:   Monitor for hyperglycemia with steroids  P:   SSI  NEUROLOGIC A:   Depression/anxiety P:   RASS goal: 0 Continue home Cymbalta D/c sedation, weaning today  FAMILY  - Updates: No family bedside this AM.  Extubate patient today, one way extubation, patient has made it clear that he does not wish to be intubated again.  He made that abundantly clear.  Will extubate, transfer out of the ICU and back to the teaching service with PCCM off.  The patient is critically ill with multiple organ  systems failure and requires high complexity decision making for assessment and support, frequent evaluation and titration of therapies, application of advanced monitoring technologies and extensive interpretation of multiple databases.   Critical Care Time devoted to patient care services described in this note is  35  Minutes. This time reflects time of care of this signee Dr Koren Bound. This critical care time does not reflect procedure time, or teaching time or supervisory time of PA/NP/Med student/Med Resident etc but could involve care discussion time.  Alyson Reedy, M.D. [facility] Pulmonary/Critical Care Medicine. Pager: [phone]. After hours pager: [phone].

## 2014-11-27 NOTE — Procedures (Signed)
Extubation Procedure Note  Patient Details:   Name: Yeab Codd DOB: 12-Dec-1930 MRN: [id number]   Airway Documentation:     Evaluation  O2 sats: stable throughout Complications: No apparent complications Patient did tolerate procedure well. Bilateral Breath Sounds: Clear Suctioning: Airway Yes   Pt. Was extubated to a 3L [place] with RN at the bedside without any complications, dyspnea or stridor noted.   Carlynn Spry 11/04/2014, 09:15 AM

## 2014-11-27 NOTE — Progress Notes (Signed)
Pt extubated per MD order, MD bedside with RT and RN. Family bedside as well. NAD, Pt 100% on 3L HNC. VSS, Will cont to monitor. Pt has verbalized adamantly that he does not under any circumstances want to be intubated again, family is aware.

## 2014-11-27 NOTE — Progress Notes (Signed)
Transferred-in from [facility] room4 by chair, awake and alert.

## 2014-11-27 NOTE — Progress Notes (Signed)
eLink Physician-Brief Progress Note Patient Name: Timothy Durham DOB: 1930-07-12 MRN: [id number]   Date of Service  10/31/2014  HPI/Events of Note  Comfort care  eICU Interventions  Morphine prn     Intervention Category Intermediate Interventions: Communication with other healthcare providers and/or family  Oretha Milch. 11/03/2014, 11:33 PM

## 2014-11-27 NOTE — Plan of Care (Addendum)
Cardiology Cross[facility]  This note summarizes this evening's events over the period of a couple hours.  I was initially called 21:30 to evaluate the pt for increased dyspnea.  He was hypoxic to the 80's despite NRB with course breath sounds & clear distress.  A CXR revealed flash pulmonary edema, treated with Lasix 80 mg IV x 1 as well as non-invasive PPV, given his DNI status.  I advised that the nurse call the pt's family to update him on the pt's status.  The patient subsequently requested to be taken off of the BiPAP & passed shortly thereafter.  I came back to talk to the family subsequently & explained the night's events.  They were surprised that he decompensated so quickly as they had talked to him 30 minutes before the event.  I explained how the flash pulmonary edema likely contributed & that it was ultimately the pt's wish to have the PPV removed.  They were at peace with this as they had understood his wishes from earlier in the day.  Lance Morin, MD

## 2014-11-27 NOTE — Care Management Important Message (Signed)
Important Message  Patient Details  Name: Timothy Durham MRN: [id number] Date of Birth: January 30, 1931   Medicare Important Message Given:  Yes-second notification given    Yvonna Alanis 11/08/2014, 12:02 PM

## 2014-11-27 DEATH — deceased

## 2014-11-28 ENCOUNTER — Ambulatory Visit (HOSPITAL_COMMUNITY): Payer: Medicare HMO

## 2014-12-02 ENCOUNTER — Encounter (HOSPITAL_COMMUNITY): Payer: Medicare HMO

## 2014-12-03 ENCOUNTER — Ambulatory Visit (HOSPITAL_COMMUNITY): Payer: Medicare HMO

## 2014-12-05 ENCOUNTER — Ambulatory Visit (HOSPITAL_COMMUNITY): Payer: Medicare HMO

## 2014-12-08 ENCOUNTER — Ambulatory Visit (HOSPITAL_COMMUNITY): Payer: Medicare HMO

## 2014-12-10 ENCOUNTER — Ambulatory Visit (HOSPITAL_COMMUNITY): Payer: Medicare HMO

## 2014-12-12 ENCOUNTER — Ambulatory Visit (HOSPITAL_COMMUNITY): Payer: Medicare HMO

## 2014-12-15 ENCOUNTER — Ambulatory Visit (HOSPITAL_COMMUNITY): Payer: Medicare HMO

## 2014-12-17 ENCOUNTER — Ambulatory Visit (HOSPITAL_COMMUNITY): Payer: Medicare HMO

## 2014-12-18 ENCOUNTER — Ambulatory Visit: Payer: Medicare HMO | Admitting: Cardiology

## 2014-12-19 ENCOUNTER — Ambulatory Visit (HOSPITAL_COMMUNITY): Payer: Medicare HMO

## 2014-12-22 ENCOUNTER — Ambulatory Visit (HOSPITAL_COMMUNITY): Payer: Medicare HMO

## 2014-12-23 IMAGING — CR DG CHEST 2V
2 series · 2 of 2 positions shown · non-contrast
Comparison: 02/18/2014

CLINICAL DATA: Shortness of Breath

EXAM:
CHEST  2 VIEW

[w chest pa]
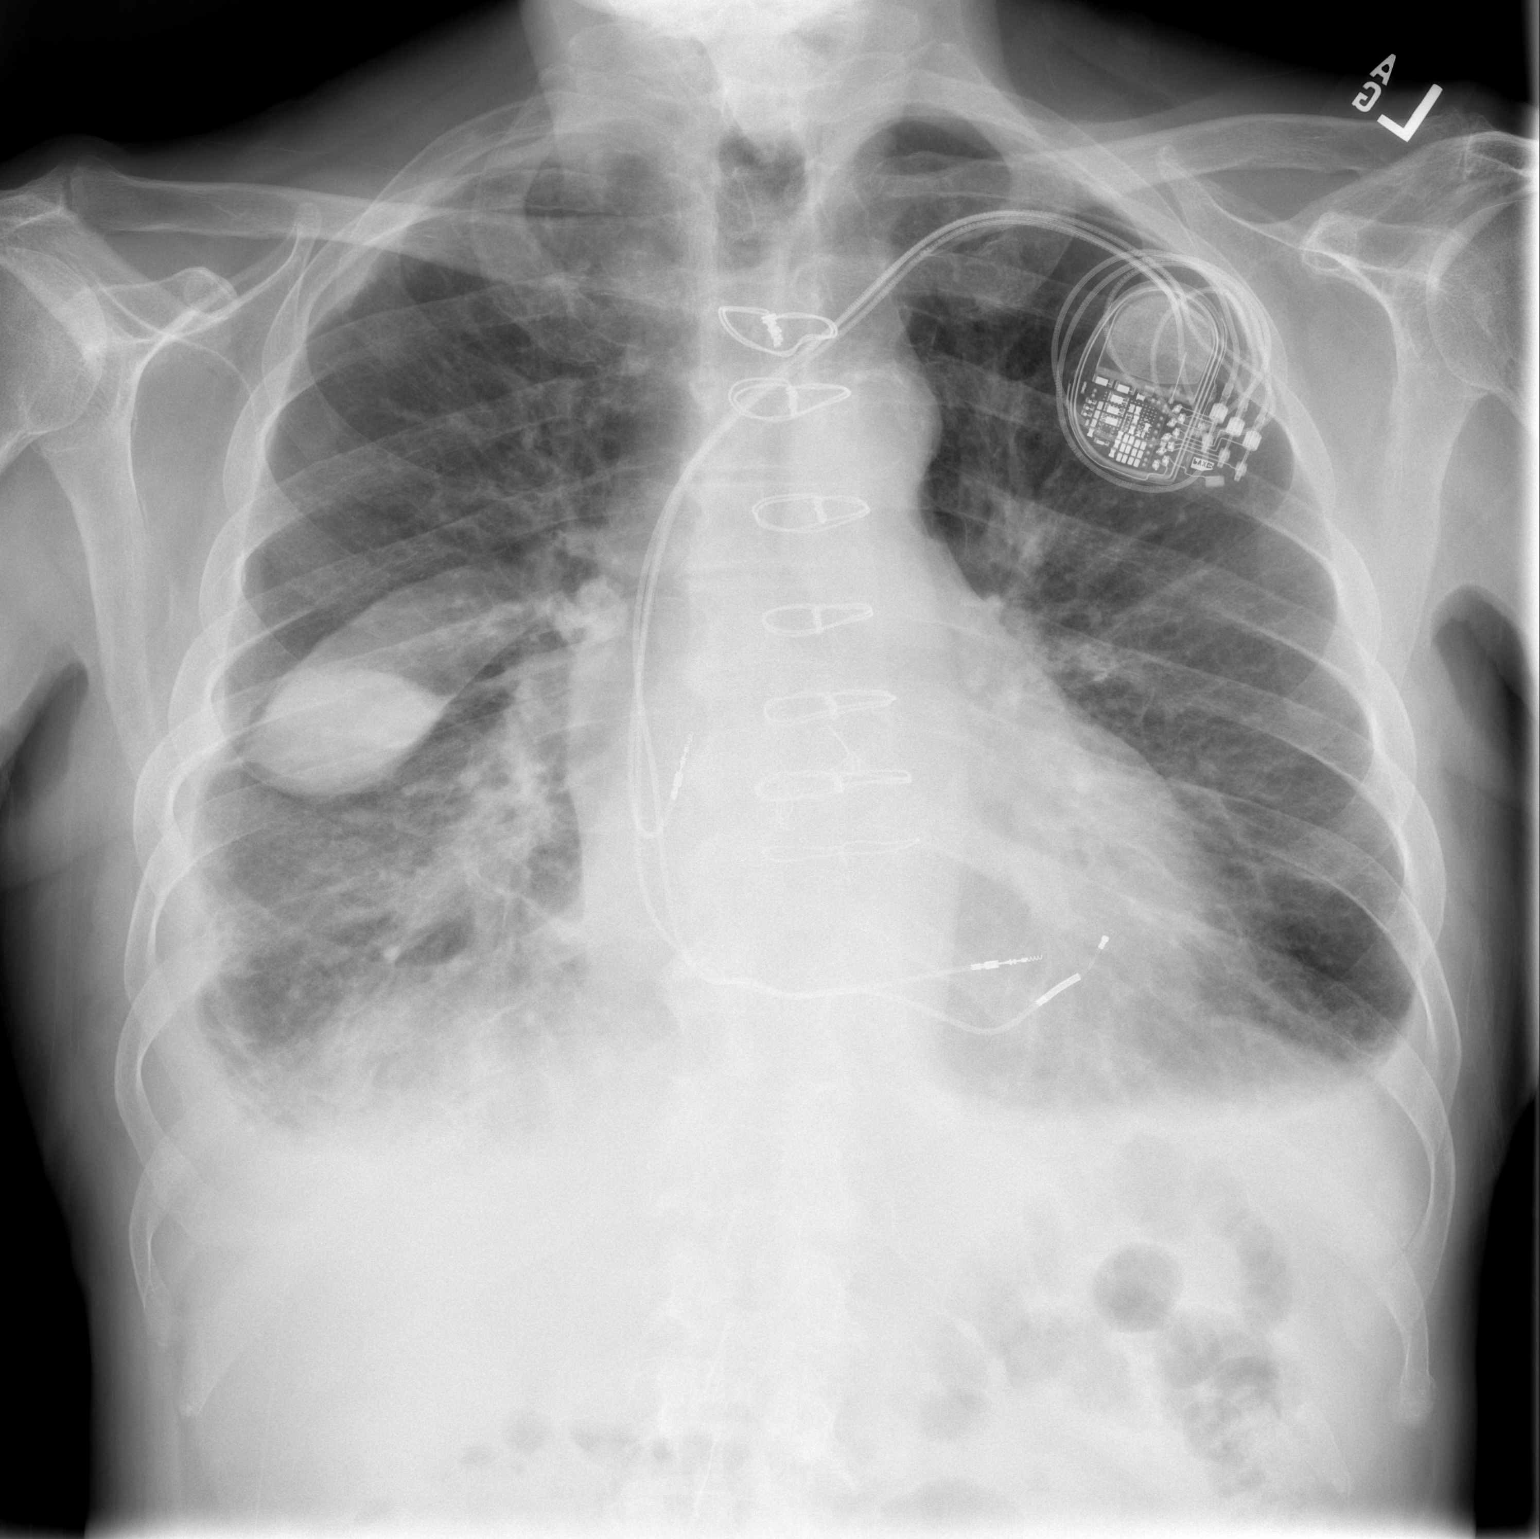

[w chest lat]
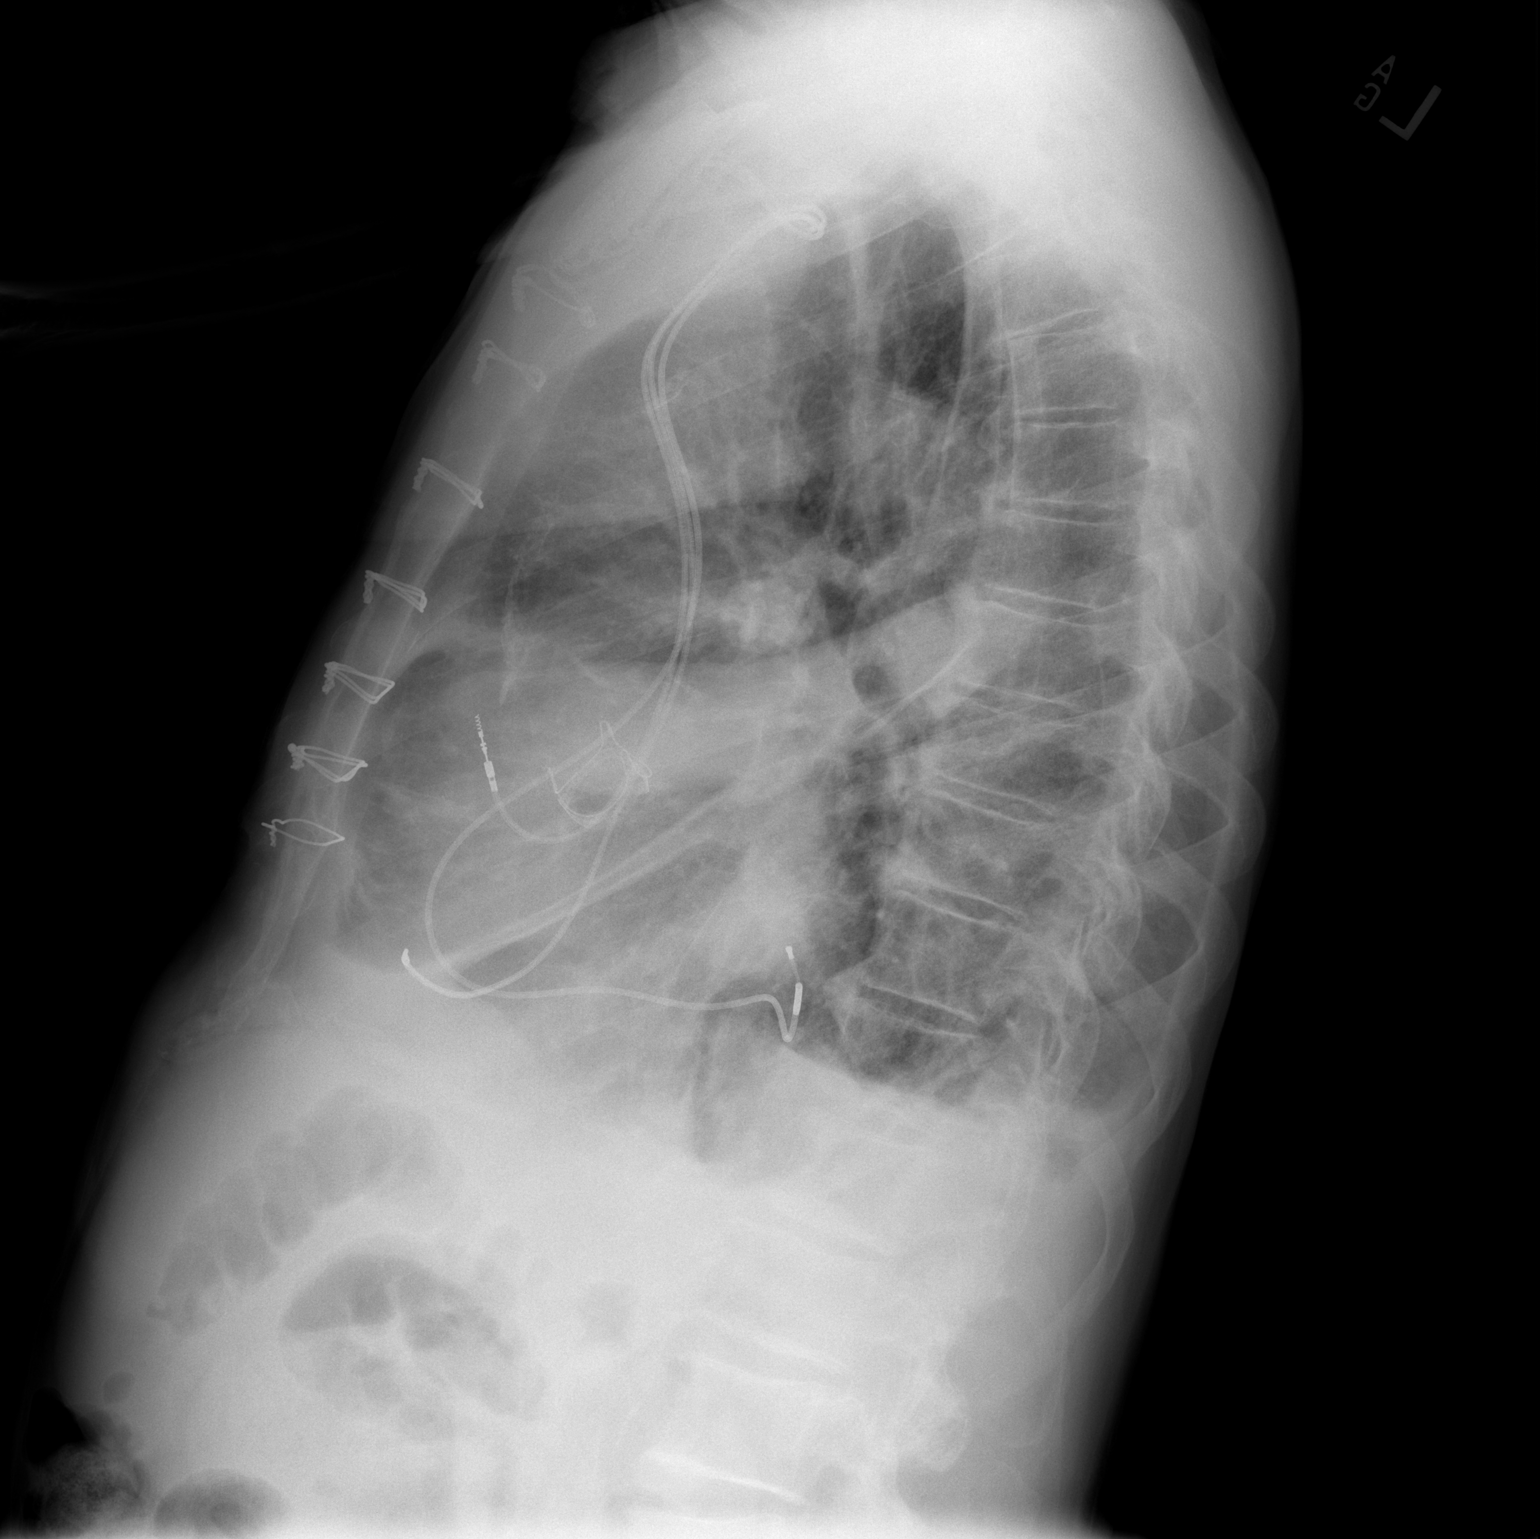

[2 of 2 positions shown; findings below may reference images not displayed]

FINDINGS: Cardiomediastinal silhouette is stable. Three leads cardiac
pacemaker is unchanged in position. Bilateral small pleural effusion
right greater than left. There is right basilar atelectasis or
infiltrate. Persistent loculated pleural fluid in right minor
fissure. Central mild vascular congestion without convincing
pulmonary edema.
IMPRESSION: Bilateral small pleural effusion right greater than left. Right
basilar atelectasis or infiltrate. Persistent loculated fluid in
right minor fissure. No convincing pulmonary edema. Three leads
cardiac pacemaker in place.

## 2014-12-24 ENCOUNTER — Ambulatory Visit (HOSPITAL_COMMUNITY): Payer: Medicare HMO

## 2014-12-26 ENCOUNTER — Ambulatory Visit (HOSPITAL_COMMUNITY): Payer: Medicare HMO

## 2014-12-29 ENCOUNTER — Ambulatory Visit (HOSPITAL_COMMUNITY): Payer: Medicare HMO

## 2014-12-29 IMAGING — CR DG CHEST 1V PORT
1 series · 1 of 1 positions shown · non-contrast
Comparison: February 26, 2014.

CLINICAL DATA: Status post line placement.

EXAM:
PORTABLE CHEST - 1 VIEW

[AP]
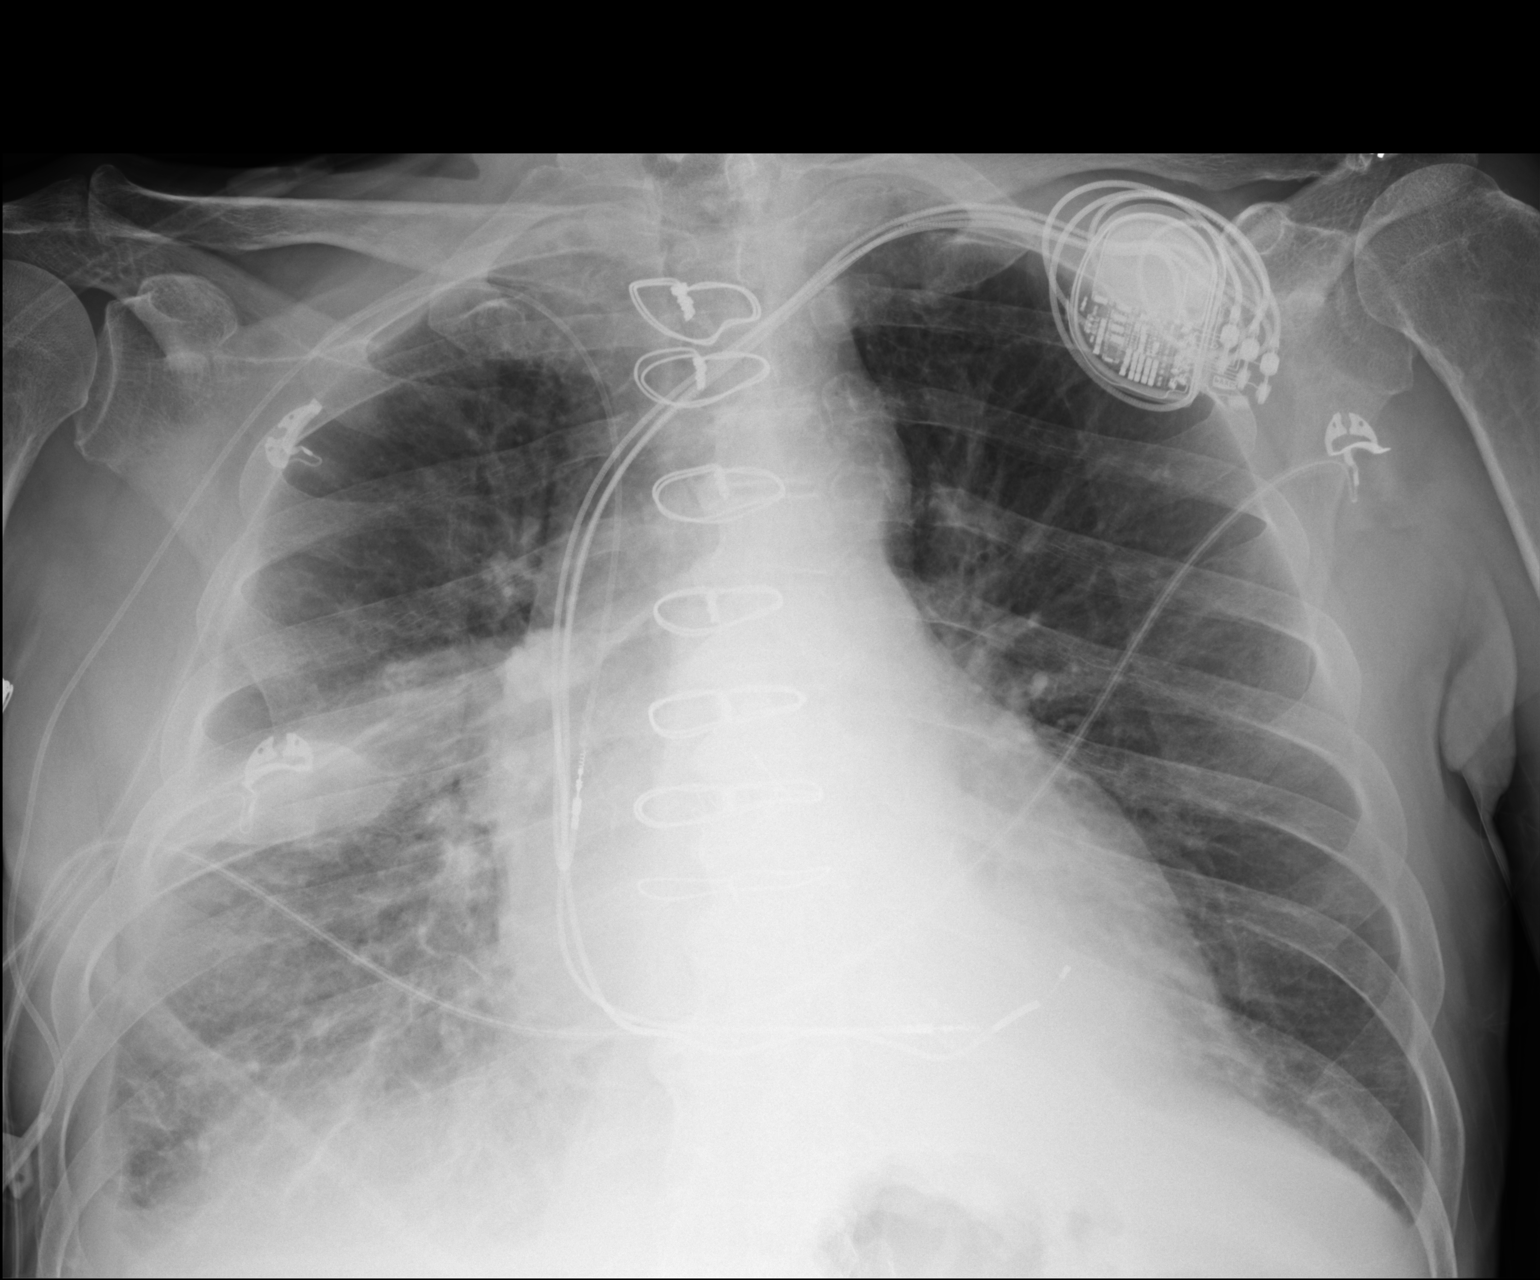

[1 of 1 positions shown; findings below may reference images not displayed]

FINDINGS: Stable cardiomediastinal silhouette. Sternotomy wires are noted.
Left-sided pacemaker is in grossly good position. No pneumothorax is
noted. Mild right pleural effusion is noted which is stable. Fluid
is noted in the right minor fissure as well. Interval placement of
right-sided PICC line with distal tip overlying expected position of
the SVC.
IMPRESSION: Interval placement of right-sided PICC line with distal tip
overlying expected position of the SVC. Mild right pleural effusion
is again noted.

## 2014-12-31 ENCOUNTER — Ambulatory Visit (HOSPITAL_COMMUNITY): Payer: Medicare HMO

## 2014-12-31 IMAGING — CR DG CHEST 2V
2 series · 2 of 2 positions shown · non-contrast
Comparison: 02/28/2014

CLINICAL DATA: Shortness of breath for 2 months, productive cough.
Previous aorta valvuloplasty

EXAM:
CHEST  2 VIEW

[chest pa]
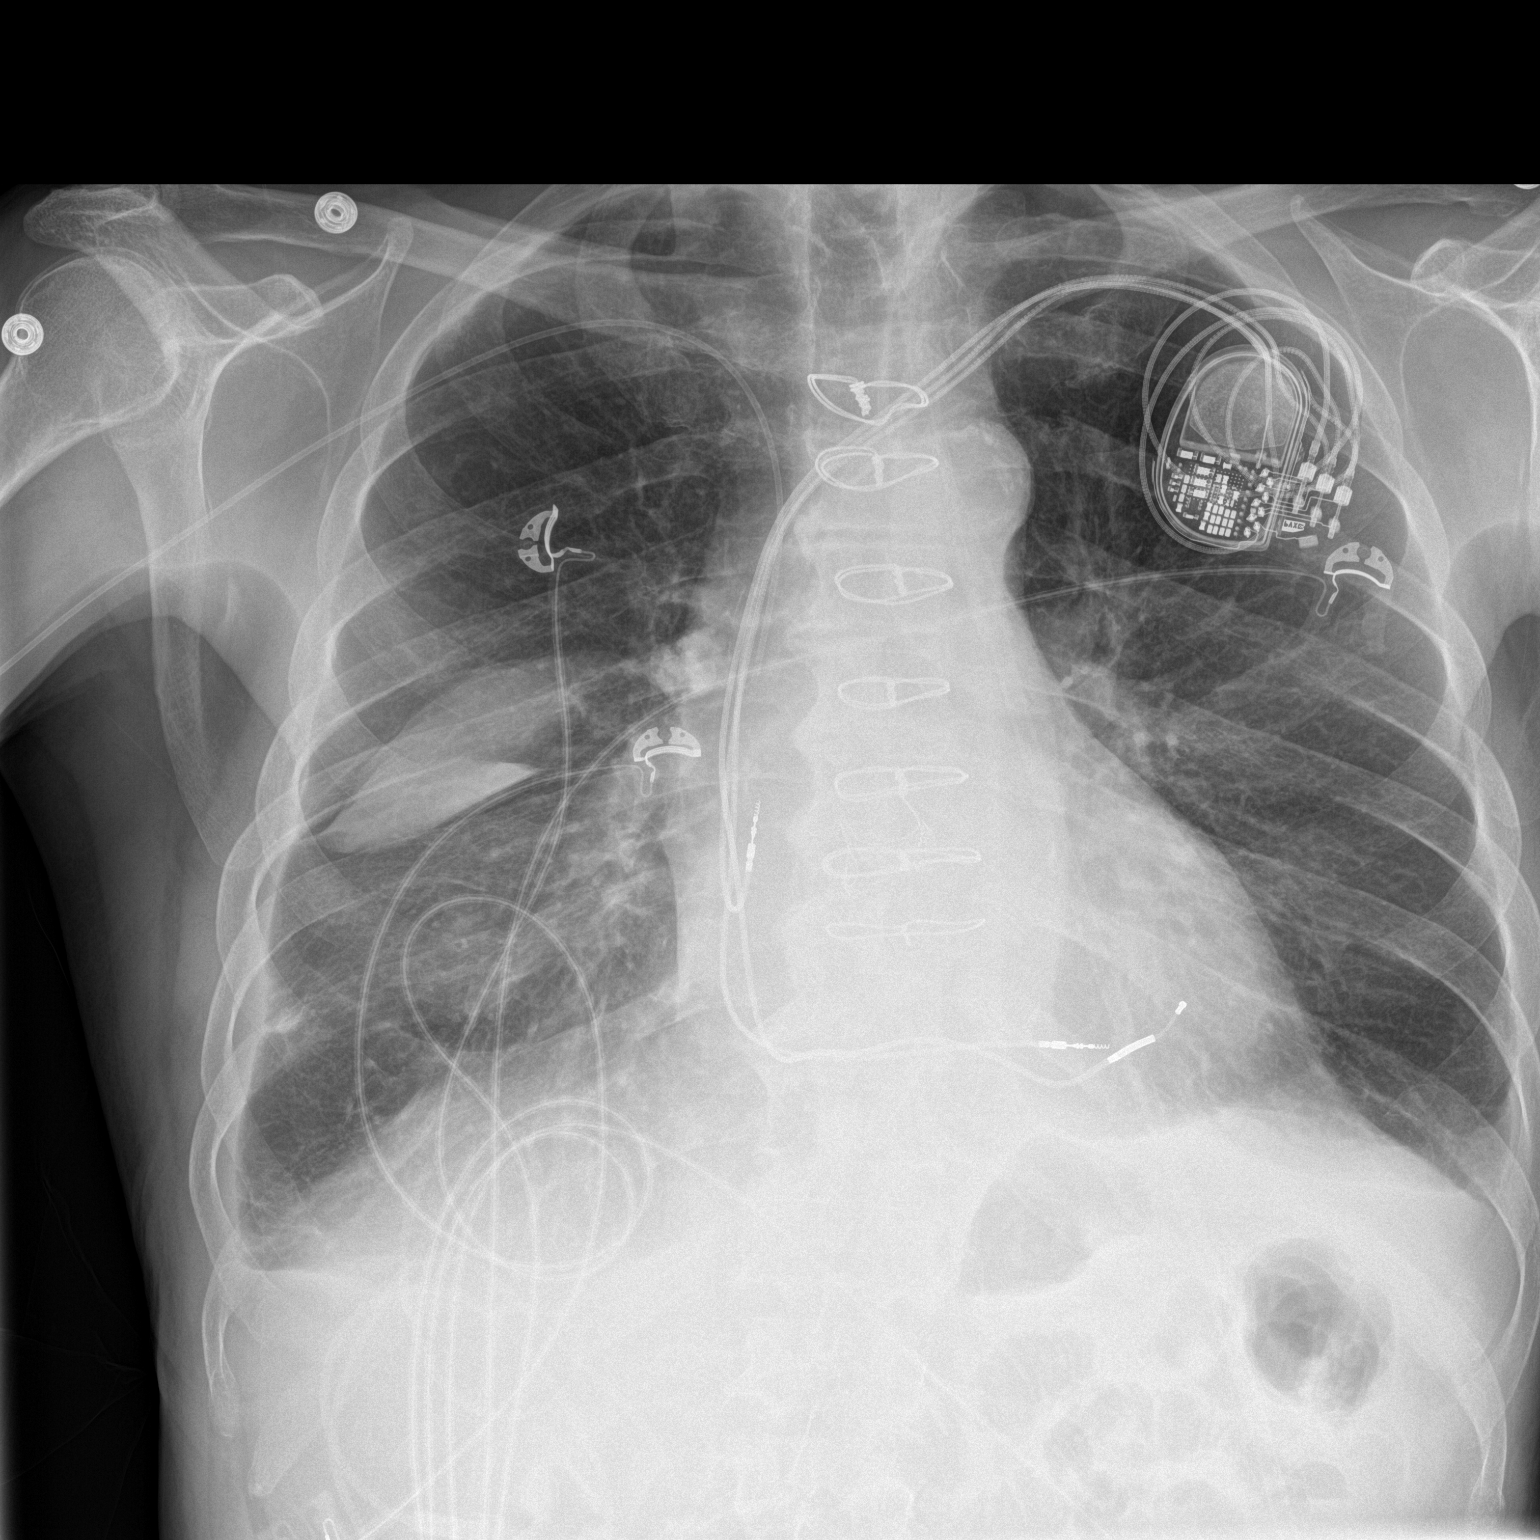

[chest lat]
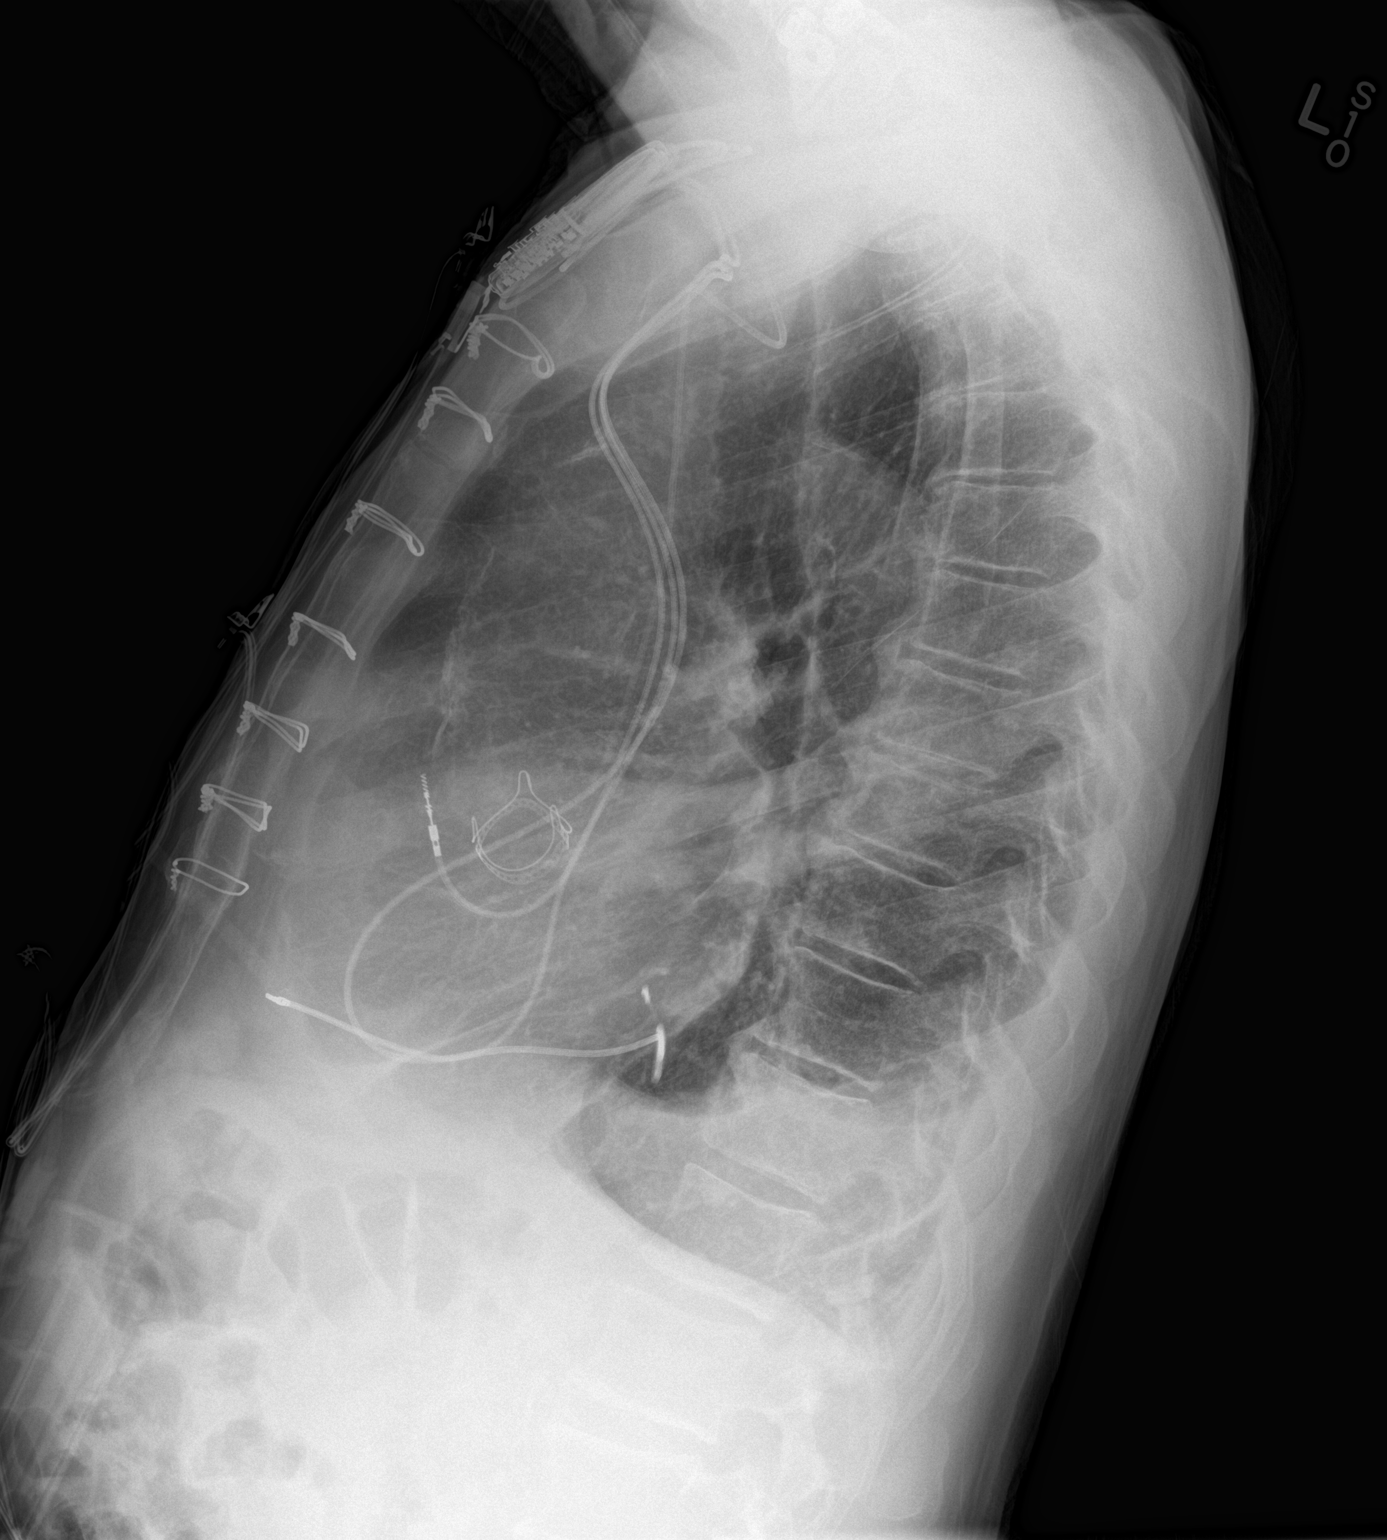

[2 of 2 positions shown; findings below may reference images not displayed]

FINDINGS: Ovoid opacity over the right midlung zone is compatible with
loculated pleural fluid in the minor fissure, unchanged. Bilateral
trace pleural effusions are noted. Interstitial Kerley B-lines are
reidentified. Mild enlargement of the cardiac silhouette with
evidence of aorta valvuloplasty and median sternotomy reidentified.
Right-sided PICC line in place with tip over the mid SVC. Three lead
left-sided pacer in place. Hyperinflation suggests COPD.
IMPRESSION: Stable trace bilateral pleural effusions with evidence of
interstitial pulmonary edema. No significant change allowing for
differences in technique.

## 2015-01-02 ENCOUNTER — Ambulatory Visit (HOSPITAL_COMMUNITY): Payer: Medicare HMO

## 2015-01-05 ENCOUNTER — Ambulatory Visit (HOSPITAL_COMMUNITY): Payer: Medicare HMO

## 2015-01-07 ENCOUNTER — Ambulatory Visit (HOSPITAL_COMMUNITY): Payer: Medicare HMO

## 2015-01-09 ENCOUNTER — Ambulatory Visit (HOSPITAL_COMMUNITY): Payer: Medicare HMO

## 2015-01-12 ENCOUNTER — Ambulatory Visit (HOSPITAL_COMMUNITY): Payer: Medicare HMO

## 2015-01-13 ENCOUNTER — Encounter (HOSPITAL_BASED_OUTPATIENT_CLINIC_OR_DEPARTMENT_OTHER): Payer: Medicare HMO

## 2015-01-14 ENCOUNTER — Ambulatory Visit (HOSPITAL_COMMUNITY): Payer: Medicare HMO

## 2015-01-16 ENCOUNTER — Ambulatory Visit (HOSPITAL_COMMUNITY): Payer: Medicare HMO

## 2015-01-19 ENCOUNTER — Ambulatory Visit (HOSPITAL_COMMUNITY): Payer: Medicare HMO

## 2015-01-21 ENCOUNTER — Ambulatory Visit (HOSPITAL_COMMUNITY): Payer: Medicare HMO

## 2015-01-23 ENCOUNTER — Ambulatory Visit (HOSPITAL_COMMUNITY): Payer: Medicare HMO

## 2015-01-26 ENCOUNTER — Ambulatory Visit (HOSPITAL_COMMUNITY): Payer: Medicare HMO

## 2015-01-28 ENCOUNTER — Ambulatory Visit (HOSPITAL_COMMUNITY): Payer: Medicare HMO

## 2015-01-30 ENCOUNTER — Ambulatory Visit (HOSPITAL_COMMUNITY): Payer: Medicare HMO

## 2015-02-02 ENCOUNTER — Ambulatory Visit (HOSPITAL_COMMUNITY): Payer: Medicare HMO

## 2015-02-04 ENCOUNTER — Ambulatory Visit (HOSPITAL_COMMUNITY): Payer: Medicare HMO

## 2015-02-06 ENCOUNTER — Ambulatory Visit (HOSPITAL_COMMUNITY): Payer: Medicare HMO

## 2015-02-09 ENCOUNTER — Ambulatory Visit (HOSPITAL_COMMUNITY): Payer: Medicare HMO

## 2015-02-11 ENCOUNTER — Ambulatory Visit (HOSPITAL_COMMUNITY): Payer: Medicare HMO

## 2015-02-13 ENCOUNTER — Ambulatory Visit (HOSPITAL_COMMUNITY): Payer: Medicare HMO
